# Patient Record
Sex: Male | Born: 1945
Health system: Southern US, Community
[De-identification: ages and names within clinical notes are randomized; demographics above are authoritative.]

## PROBLEM LIST (undated history)

## (undated) DIAGNOSIS — T8859XA Other complications of anesthesia, initial encounter: Secondary | ICD-10-CM

## (undated) DIAGNOSIS — R011 Cardiac murmur, unspecified: Secondary | ICD-10-CM

## (undated) DIAGNOSIS — C61 Malignant neoplasm of prostate: Secondary | ICD-10-CM

## (undated) DIAGNOSIS — Z9889 Other specified postprocedural states: Secondary | ICD-10-CM

## (undated) DIAGNOSIS — G4733 Obstructive sleep apnea (adult) (pediatric): Secondary | ICD-10-CM

## (undated) DIAGNOSIS — H9190 Unspecified hearing loss, unspecified ear: Secondary | ICD-10-CM

## (undated) DIAGNOSIS — Z9989 Dependence on other enabling machines and devices: Secondary | ICD-10-CM

## (undated) DIAGNOSIS — E119 Type 2 diabetes mellitus without complications: Secondary | ICD-10-CM

## (undated) DIAGNOSIS — Q761 Klippel-Feil syndrome: Secondary | ICD-10-CM

## (undated) DIAGNOSIS — M47812 Spondylosis without myelopathy or radiculopathy, cervical region: Secondary | ICD-10-CM

## (undated) DIAGNOSIS — Z973 Presence of spectacles and contact lenses: Secondary | ICD-10-CM

## (undated) DIAGNOSIS — Z8546 Personal history of malignant neoplasm of prostate: Secondary | ICD-10-CM

## (undated) DIAGNOSIS — D649 Anemia, unspecified: Secondary | ICD-10-CM

## (undated) DIAGNOSIS — R112 Nausea with vomiting, unspecified: Secondary | ICD-10-CM

## (undated) DIAGNOSIS — I10 Essential (primary) hypertension: Secondary | ICD-10-CM

## (undated) DIAGNOSIS — M5030 Other cervical disc degeneration, unspecified cervical region: Secondary | ICD-10-CM

## (undated) DIAGNOSIS — N281 Cyst of kidney, acquired: Secondary | ICD-10-CM

## (undated) DIAGNOSIS — I6529 Occlusion and stenosis of unspecified carotid artery: Secondary | ICD-10-CM

## (undated) DIAGNOSIS — Z8739 Personal history of other diseases of the musculoskeletal system and connective tissue: Secondary | ICD-10-CM

## (undated) DIAGNOSIS — E785 Hyperlipidemia, unspecified: Secondary | ICD-10-CM

## (undated) DIAGNOSIS — M5136 Other intervertebral disc degeneration, lumbar region: Secondary | ICD-10-CM

## (undated) DIAGNOSIS — C679 Malignant neoplasm of bladder, unspecified: Secondary | ICD-10-CM

## (undated) DIAGNOSIS — I2510 Atherosclerotic heart disease of native coronary artery without angina pectoris: Secondary | ICD-10-CM

## (undated) DIAGNOSIS — Z860101 Personal history of adenomatous and serrated colon polyps: Secondary | ICD-10-CM

## (undated) DIAGNOSIS — N400 Enlarged prostate without lower urinary tract symptoms: Secondary | ICD-10-CM

## (undated) DIAGNOSIS — M1990 Unspecified osteoarthritis, unspecified site: Secondary | ICD-10-CM

## (undated) DIAGNOSIS — Z86010 Personal history of colonic polyps: Secondary | ICD-10-CM

## (undated) DIAGNOSIS — I739 Peripheral vascular disease, unspecified: Secondary | ICD-10-CM

## (undated) DIAGNOSIS — M51369 Other intervertebral disc degeneration, lumbar region without mention of lumbar back pain or lower extremity pain: Secondary | ICD-10-CM

## (undated) DIAGNOSIS — K5730 Diverticulosis of large intestine without perforation or abscess without bleeding: Secondary | ICD-10-CM

## (undated) HISTORY — DX: Atherosclerotic heart disease of native coronary artery without angina pectoris: I25.10

## (undated) HISTORY — PX: INTRAOPERATIVE ARTERIOGRAM: SHX5157

## (undated) HISTORY — PX: COLONOSCOPY: SHX174

## (undated) HISTORY — DX: Essential (primary) hypertension: I10

## (undated) HISTORY — DX: Obstructive sleep apnea (adult) (pediatric): G47.33

## (undated) HISTORY — DX: Hyperlipidemia, unspecified: E78.5

## (undated) HISTORY — PX: APPENDECTOMY: SHX54

## (undated) HISTORY — DX: Dependence on other enabling machines and devices: Z99.89

---

## 1996-09-17 HISTORY — PX: SEPTOPLASTY: SUR1290

## 2002-03-17 ENCOUNTER — Ambulatory Visit: Admission: RE | Admit: 2002-03-17 | Discharge: 2002-03-17 | Payer: Self-pay | Admitting: Family Medicine

## 2002-06-29 ENCOUNTER — Encounter: Payer: Self-pay | Admitting: Internal Medicine

## 2002-06-29 ENCOUNTER — Ambulatory Visit: Admission: RE | Admit: 2002-06-29 | Discharge: 2002-06-29 | Payer: Self-pay | Admitting: Critical Care Medicine

## 2002-06-29 ENCOUNTER — Encounter: Payer: Self-pay | Admitting: Pulmonary Disease

## 2002-07-14 ENCOUNTER — Encounter: Payer: Self-pay | Admitting: Pulmonary Disease

## 2003-04-13 ENCOUNTER — Ambulatory Visit: Admission: RE | Admit: 2003-04-13 | Discharge: 2003-07-12 | Payer: Self-pay | Admitting: Radiation Oncology

## 2003-08-03 ENCOUNTER — Ambulatory Visit: Admission: RE | Admit: 2003-08-03 | Discharge: 2003-09-23 | Payer: Self-pay | Admitting: Radiation Oncology

## 2003-08-05 ENCOUNTER — Inpatient Hospital Stay (HOSPITAL_COMMUNITY): Admission: EM | Admit: 2003-08-05 | Discharge: 2003-08-09 | Payer: Self-pay | Admitting: Emergency Medicine

## 2003-08-19 ENCOUNTER — Encounter (INDEPENDENT_AMBULATORY_CARE_PROVIDER_SITE_OTHER): Payer: Self-pay

## 2003-08-19 ENCOUNTER — Ambulatory Visit (HOSPITAL_COMMUNITY): Admission: RE | Admit: 2003-08-19 | Discharge: 2003-08-19 | Payer: Self-pay | Admitting: Urology

## 2003-08-19 ENCOUNTER — Ambulatory Visit (HOSPITAL_BASED_OUTPATIENT_CLINIC_OR_DEPARTMENT_OTHER): Admission: RE | Admit: 2003-08-19 | Discharge: 2003-08-19 | Payer: Self-pay | Admitting: Urology

## 2003-08-19 HISTORY — PX: OTHER SURGICAL HISTORY: SHX169

## 2003-11-02 ENCOUNTER — Ambulatory Visit (HOSPITAL_COMMUNITY): Admission: RE | Admit: 2003-11-02 | Discharge: 2003-11-02 | Payer: Self-pay | Admitting: Gastroenterology

## 2004-11-05 ENCOUNTER — Emergency Department (HOSPITAL_COMMUNITY): Admission: EM | Admit: 2004-11-05 | Discharge: 2004-11-05 | Payer: Self-pay | Admitting: Emergency Medicine

## 2004-11-16 ENCOUNTER — Ambulatory Visit: Payer: Self-pay | Admitting: Pulmonary Disease

## 2005-05-11 ENCOUNTER — Inpatient Hospital Stay (HOSPITAL_COMMUNITY): Admission: RE | Admit: 2005-05-11 | Discharge: 2005-05-15 | Payer: Self-pay | Admitting: Neurosurgery

## 2005-05-11 HISTORY — PX: CERVICAL FUSION: SHX112

## 2005-05-22 ENCOUNTER — Encounter (HOSPITAL_COMMUNITY): Admission: RE | Admit: 2005-05-22 | Discharge: 2005-06-16 | Payer: Self-pay | Admitting: Neurosurgery

## 2005-06-05 ENCOUNTER — Inpatient Hospital Stay (HOSPITAL_COMMUNITY): Admission: RE | Admit: 2005-06-05 | Discharge: 2005-06-13 | Payer: Self-pay | Admitting: Neurosurgery

## 2005-06-05 ENCOUNTER — Ambulatory Visit: Payer: Self-pay | Admitting: Physical Medicine & Rehabilitation

## 2005-06-05 HISTORY — PX: OTHER SURGICAL HISTORY: SHX169

## 2005-08-07 ENCOUNTER — Encounter (HOSPITAL_COMMUNITY): Admission: RE | Admit: 2005-08-07 | Discharge: 2005-09-07 | Payer: Self-pay | Admitting: Neurosurgery

## 2006-07-16 ENCOUNTER — Ambulatory Visit: Payer: Self-pay | Admitting: Pulmonary Disease

## 2007-04-08 ENCOUNTER — Ambulatory Visit (HOSPITAL_COMMUNITY): Admission: RE | Admit: 2007-04-08 | Discharge: 2007-04-08 | Payer: Self-pay | Admitting: Internal Medicine

## 2007-11-12 ENCOUNTER — Ambulatory Visit: Payer: Self-pay | Admitting: Internal Medicine

## 2007-11-12 LAB — CONVERTED CEMR LAB
Alkaline Phosphatase: 83 units/L (ref 39–117)
CO2: 30 meq/L (ref 19–32)
Calcium: 9.5 mg/dL (ref 8.4–10.5)
Chloride: 100 meq/L (ref 96–112)
Creatinine, Ser: 1.2 mg/dL (ref 0.4–1.5)
Eosinophils Absolute: 0.3 10*3/uL (ref 0.0–0.6)
Eosinophils Relative: 4.6 % (ref 0.0–5.0)
Glucose, Bld: 447 mg/dL — ABNORMAL HIGH (ref 70–99)
Lymphocytes Relative: 22.1 % (ref 12.0–46.0)
MCV: 90.4 fL (ref 78.0–100.0)
Neutro Abs: 4.6 10*3/uL (ref 1.4–7.7)
Platelets: 188 10*3/uL (ref 150–400)
Total Protein: 6.8 g/dL (ref 6.0–8.3)
WBC: 7.5 10*3/uL (ref 4.5–10.5)

## 2007-11-13 ENCOUNTER — Ambulatory Visit: Payer: Self-pay | Admitting: Cardiology

## 2007-12-24 ENCOUNTER — Ambulatory Visit: Payer: Self-pay | Admitting: Internal Medicine

## 2008-01-12 ENCOUNTER — Encounter: Payer: Self-pay | Admitting: Orthopedic Surgery

## 2008-01-12 ENCOUNTER — Encounter: Admission: RE | Admit: 2008-01-12 | Discharge: 2008-01-12 | Payer: Self-pay | Admitting: Neurosurgery

## 2008-02-07 ENCOUNTER — Inpatient Hospital Stay (HOSPITAL_COMMUNITY): Admission: EM | Admit: 2008-02-07 | Discharge: 2008-02-10 | Payer: Self-pay | Admitting: Emergency Medicine

## 2008-03-22 ENCOUNTER — Telehealth (INDEPENDENT_AMBULATORY_CARE_PROVIDER_SITE_OTHER): Payer: Self-pay | Admitting: *Deleted

## 2008-03-22 DIAGNOSIS — Z8546 Personal history of malignant neoplasm of prostate: Secondary | ICD-10-CM | POA: Insufficient documentation

## 2008-03-22 DIAGNOSIS — M1990 Unspecified osteoarthritis, unspecified site: Secondary | ICD-10-CM | POA: Insufficient documentation

## 2008-03-22 DIAGNOSIS — I739 Peripheral vascular disease, unspecified: Secondary | ICD-10-CM | POA: Insufficient documentation

## 2008-03-22 DIAGNOSIS — K5909 Other constipation: Secondary | ICD-10-CM | POA: Insufficient documentation

## 2008-03-22 DIAGNOSIS — G4733 Obstructive sleep apnea (adult) (pediatric): Secondary | ICD-10-CM | POA: Insufficient documentation

## 2008-03-22 DIAGNOSIS — E785 Hyperlipidemia, unspecified: Secondary | ICD-10-CM | POA: Insufficient documentation

## 2008-03-22 DIAGNOSIS — C679 Malignant neoplasm of bladder, unspecified: Secondary | ICD-10-CM | POA: Insufficient documentation

## 2008-03-22 DIAGNOSIS — Z8719 Personal history of other diseases of the digestive system: Secondary | ICD-10-CM | POA: Insufficient documentation

## 2008-03-23 ENCOUNTER — Ambulatory Visit: Payer: Self-pay | Admitting: Pulmonary Disease

## 2009-01-04 ENCOUNTER — Encounter: Admission: RE | Admit: 2009-01-04 | Discharge: 2009-01-04 | Payer: Self-pay | Admitting: Cardiovascular Disease

## 2009-01-05 ENCOUNTER — Ambulatory Visit (HOSPITAL_COMMUNITY): Admission: RE | Admit: 2009-01-05 | Discharge: 2009-01-05 | Payer: Self-pay | Admitting: Cardiovascular Disease

## 2009-02-03 ENCOUNTER — Ambulatory Visit: Payer: Self-pay | Admitting: *Deleted

## 2009-08-09 ENCOUNTER — Ambulatory Visit: Payer: Self-pay | Admitting: Vascular Surgery

## 2009-08-31 ENCOUNTER — Ambulatory Visit (HOSPITAL_COMMUNITY): Admission: RE | Admit: 2009-08-31 | Discharge: 2009-08-31 | Payer: Self-pay | Admitting: Internal Medicine

## 2009-09-17 HISTORY — PX: OTHER SURGICAL HISTORY: SHX169

## 2009-11-08 DIAGNOSIS — I10 Essential (primary) hypertension: Secondary | ICD-10-CM | POA: Insufficient documentation

## 2009-11-08 DIAGNOSIS — I739 Peripheral vascular disease, unspecified: Secondary | ICD-10-CM

## 2009-11-08 DIAGNOSIS — C61 Malignant neoplasm of prostate: Secondary | ICD-10-CM | POA: Insufficient documentation

## 2009-11-08 DIAGNOSIS — M1A00X0 Idiopathic chronic gout, unspecified site, without tophus (tophi): Secondary | ICD-10-CM | POA: Insufficient documentation

## 2009-11-08 DIAGNOSIS — I779 Disorder of arteries and arterioles, unspecified: Secondary | ICD-10-CM | POA: Insufficient documentation

## 2009-11-08 DIAGNOSIS — E663 Overweight: Secondary | ICD-10-CM | POA: Insufficient documentation

## 2009-11-23 ENCOUNTER — Ambulatory Visit: Payer: Self-pay | Admitting: Cardiovascular Disease

## 2009-12-11 ENCOUNTER — Encounter: Payer: Self-pay | Admitting: Orthopedic Surgery

## 2009-12-12 ENCOUNTER — Ambulatory Visit: Payer: Self-pay | Admitting: Orthopedic Surgery

## 2009-12-12 DIAGNOSIS — IMO0002 Reserved for concepts with insufficient information to code with codable children: Secondary | ICD-10-CM

## 2009-12-12 DIAGNOSIS — M1710 Unilateral primary osteoarthritis, unspecified knee: Secondary | ICD-10-CM | POA: Insufficient documentation

## 2009-12-12 DIAGNOSIS — M25569 Pain in unspecified knee: Secondary | ICD-10-CM | POA: Insufficient documentation

## 2010-01-11 ENCOUNTER — Telehealth: Payer: Self-pay | Admitting: Orthopedic Surgery

## 2010-01-11 ENCOUNTER — Ambulatory Visit: Payer: Self-pay | Admitting: Orthopedic Surgery

## 2010-01-11 DIAGNOSIS — M25469 Effusion, unspecified knee: Secondary | ICD-10-CM | POA: Insufficient documentation

## 2010-02-06 ENCOUNTER — Encounter: Admission: RE | Admit: 2010-02-06 | Discharge: 2010-02-06 | Payer: Self-pay | Admitting: Neurosurgery

## 2010-02-06 ENCOUNTER — Encounter: Payer: Self-pay | Admitting: Orthopedic Surgery

## 2010-02-27 ENCOUNTER — Encounter: Admission: RE | Admit: 2010-02-27 | Discharge: 2010-02-27 | Payer: Self-pay | Admitting: Neurosurgery

## 2010-03-23 ENCOUNTER — Encounter: Admission: RE | Admit: 2010-03-23 | Discharge: 2010-03-23 | Payer: Self-pay | Admitting: Neurosurgery

## 2010-04-14 ENCOUNTER — Emergency Department (HOSPITAL_COMMUNITY): Admission: EM | Admit: 2010-04-14 | Discharge: 2010-04-14 | Payer: Self-pay | Admitting: Emergency Medicine

## 2010-05-09 ENCOUNTER — Encounter: Admission: RE | Admit: 2010-05-09 | Discharge: 2010-05-09 | Payer: Self-pay | Admitting: Neurosurgery

## 2010-05-09 ENCOUNTER — Encounter: Payer: Self-pay | Admitting: Cardiovascular Disease

## 2010-05-30 ENCOUNTER — Ambulatory Visit: Payer: Self-pay | Admitting: Cardiovascular Disease

## 2010-05-30 ENCOUNTER — Ambulatory Visit: Payer: Self-pay

## 2010-06-14 ENCOUNTER — Telehealth: Payer: Self-pay | Admitting: Cardiovascular Disease

## 2010-07-19 ENCOUNTER — Ambulatory Visit: Payer: Self-pay | Admitting: Orthopedic Surgery

## 2010-07-24 LAB — CONVERTED CEMR LAB: WBC, Synovial: 11475 — ABNORMAL HIGH (ref 0–200)

## 2010-08-06 ENCOUNTER — Emergency Department (HOSPITAL_COMMUNITY)
Admission: EM | Admit: 2010-08-06 | Discharge: 2010-08-06 | Disposition: A | Discharge status: Other Acute Inpatient Hospital | Payer: Self-pay | Source: Home / Self Care | Admitting: Emergency Medicine

## 2010-10-08 ENCOUNTER — Encounter: Payer: Self-pay | Admitting: Neurosurgery

## 2010-10-11 ENCOUNTER — Encounter: Payer: Self-pay | Admitting: Cardiovascular Disease

## 2010-10-17 NOTE — Assessment & Plan Note (Signed)
Summary: Hall/knee pain/NEEDS XRAY/[facility]/frs   Vital Signs:  Patient profile:   65 year old male Height:      68 inches Weight:      232 pounds Pulse rate:   70 / minute Resp:     16 per minute  Vitals Entered By: Fuller Canada MD (December 12, 2009 2:22 PM)  Visit Type:  new patient Referring Provider:  self Primary Provider:  Dwana Melena [facility])  CC:  right knee pain.  History of Present Illness: 65 year old male with carotid stenosis presents at the request of Dr. Shelva Majestic with RIGHT leg pain he says but says it is really his RIGHT knee and it radiates down to his RIGHT foot.  He says when he bends his knee really hurts.  He says he has some swelling.  He says Vicodin doesn't help.  He says it's a dull aching moderate pain which is constant came on gradually associated with the swelling.  He also has some lumbar disc disease he had an MRI done April 2009 it showed degenerative disc disease in the lumbar spine with multilevel disease from L3-S1 primarily was facet arthritis and degeneration of the disc material   Xrays today in our office.  FYI MRI L spine 01/12/2008 for review, [place].  Meds: Clonidine, Allopurinol, Niaspan, Klor Con, Labetalol, Crestor, Exforge, Lasix, Lovaza, Plavix, Januvia, Glimeperide, Neurontin, ASA, Tylenol, Vicodin 5, Levemir.       Allergies: 1)  ! * Lyrica  Past History:  Past Surgical History: Status post cervical disk fusion.  appendix  Family History: FH of Cancer:  Family History of Diabetes Family History of Arthritis  Social History: Patient is married.  disabled no smoking no alcohol 2 cups of coffee per day  Review of Systems Constitutional:  Denies weight loss, weight gain, fever, chills, and fatigue. Cardiovascular:  Denies chest pain, palpitations, fainting, and murmurs. Respiratory:  Denies short of breath, wheezing, couch, tightness, pain on inspiration, and snoring . Gastrointestinal:  Denies  heartburn, nausea, vomiting, diarrhea, constipation, and blood in your stools. Genitourinary:  Denies frequency, urgency, difficulty urinating, painful urination, flank pain, and bleeding in urine. Neurologic:  Denies numbness, tingling, unsteady gait, dizziness, tremors, and seizure. Musculoskeletal:  Complains of joint pain, swelling, and stiffness; denies instability, redness, heat, and muscle pain. Endocrine:  Denies excessive thirst, exessive urination, and heat or cold intolerance. Psychiatric:  Denies nervousness, depression, anxiety, and hallucinations. Skin:  Denies changes in the skin, poor healing, rash, itching, and redness. HEENT:  Denies blurred or double vision, eye pain, redness, and watering. Immunology:  Denies seasonal allergies, sinus problems, and allergic to bee stings. Hemoatologic:  Complains of easy bleeding and brusing.  Physical Exam  Additional Exam:  examination vitals are stable as recorded.  Patient is well-developed well-nourished in no acute distress.  Patient is oriented times person place.  He has no generalized joint laxity.  The overall appearance of the RIGHT and LEFT knees are normal.  Gait testing normal.  Leg lengths equal.  He's tender in the medial joint line of the RIGHT knee he has varus deformity of the RIGHT knee.  Has what appears to be small joint effusion.  He has no anterior laxity no medial or lateral laxity.  His active range of motion 130 extension full in both knees.  Strength testing normal both knees.  Sensation in the RIGHT leg normal.  Vascular status lower extremities fully intact.     Impression & Recommendations: Impression osteoarthritis with  varus  deformity RIGHT knee  Code 715.16  / 719.56.  Office visit [phone]  new patient level III modifier 25 for aspiration injection  RIGHT knee and attempted aspiration unsuccessful for him to get any fluid.  We did inject Depo-Medrol and lidocaine. Verbal consent was obtained. The knee  was prepped with alcohol and ethyl chloride. 1 cc of depomedrol 40mg /cc and 4 cc of lidocaine 1% was injected. there were no complications.   Radiographic interpretation 3 views of the RIGHT knee were obtained and reviewed.  please see mild varus alignment and mild symmetric joint space narrowing with bowing of the proximal tibia consistent with mild varus osteoarthritis. I will continue his Vicodin he can't take anti-inflammatories and his pain medicine could be due to increased at your discretion but I would tend not to do that.  It doesn't appear at this point that he needs surgery as a knee replacement would be a last resort  Follow up as needed  Other Orders: New Patient Level III (81191) Joint Aspirate / Injection, Large (20610) Depo- Medrol 40mg  (J1030) Knee x-ray,  3 views (47829)  Patient Instructions: 1)  Please schedule a follow-up appointment as needed. 2)  You have received an injection of cortisone today. You may experience increased pain at the injection site. Apply ice pack to the area for 20 minutes every 2 hours and take 2 xtra strength tylenol every 8 hours. This increased pain will usually resolve in 24 hours. The injection will take effect in 3-10 days.

## 2010-10-17 NOTE — Progress Notes (Signed)
Summary: PHI  PHI   Imported By: Harlon Flor 11/24/2009 10:09:05  _____________________________________________________________________  External Attachment:    Type:   Image     Comment:   External Document

## 2010-10-17 NOTE — Assessment & Plan Note (Signed)
Summary: F6M/AMD   Visit Type:  Follow-up Referring Tranae Laramie:  self Primary Quentyn Kolbeck:  Dwana Melena [facility])  CC:  c/o decrease blood pressure with black out spells.  Wife had to catch him this a.m. from falling to the floor.Marland Kitchen  History of Present Illness: 65 year old male with a history of obstructive sleep apnea, peripheral vascular disease with severe right internal carotid arterial stenosis estimated at 70 to 80%, diabetes, hypertension, hyperlipidemia, obesity, history of cervical spine surgery and prostate cancer with treatment who presents for routine followup.  [name] states that he stopped smoking approximately 3 years ago. He did smoke for a short period around December.  He has had significant problems with hypotension recently. Dr. Renae Fickle has recently decreased his ex for chest pain continues to have very low pressures and passing out spells. He has a list of his blood pressures that showed sometimes the systolic pressure is less than 100. He is off clonidine, takes labetalol, exforge 5/320 mg. he denies any chest pain. He has had no significant lower extremity edema or shortness of breath.  EKG shows normal sinus rhythm with rate of 84 beats per minute, no significant ST or T wave changes.  Current Medications (verified): 1)  Furosemide 40 Mg  Tabs (Furosemide) .... Take 2 Tablet By Mouth Once A Day 2)  Plavix 75 Mg  Tabs (Clopidogrel Bisulfate) .... Take 1 Tablet By Mouth Once A Day 3)  Colon Cleanser   Caps (Misc Natural Products) .... Take 1 Tablet By Mouth Once A Day 4)  Klor-Con 20 Meq  Pack (Potassium Chloride) .... Take 1 Tablet By Mouth Once A Day 5)  Labetalol Hcl 300 Mg  Tabs (Labetalol Hcl) .... Take 1 Tablet By Mouth Two Times A Day 6)  Exforge 5-320 Mg Tabs (Amlodipine Besylate-Valsartan) .... One Tablet Once Daily 7)  Crestor 5 Mg  Tabs (Rosuvastatin Calcium) .... Take 1 Tablet By Mouth Once A Day 8)  Lovaza 1 Gm  Caps (Omega-3-Acid Ethyl Esters) .... Take 2  Tabs By Mouth Two Times A Day 9)  Allopurinol 300 Mg  Tabs (Allopurinol) .... Take 1 Tablet By Mouth Once A Day 10)  Niaspan 1000 Mg  Tbcr (Niacin (Antihyperlipidemic)) .... Take 1 Tablet By Mouth Once A Day 11)  Aspirin Low Dose 81 Mg  Tabs (Aspirin) .... Take 1 Tablet By Mouth Once A Day 12)  Gabapentin 300 Mg  Caps (Gabapentin) .... Take 1 Tablet By Mouth Three Times A Day 13)  Glimepiride 2 Mg  Tabs (Glimepiride) .Marland Kitchen.. 1 By Mouth Two Times A Day 14)  Tylenol Extra Strength 500 Mg  Tabs (Acetaminophen) .... Take By Mouth As Needed 15)  Tylenol Pm Extra Strength 500-25 Mg  Tabs (Diphenhydramine-Apap (Sleep)) .... Use As Needed 16)  Colchicine 0.6 Mg  Tabs (Colchicine) .... Take By Mouth As Needed 17)  Hydrocodone/ Apap .... Take 1 Tab By Mouth Every 4 To 6 Hours As Needed 18)  Januvia 100 Mg  Tabs (Sitagliptin Phosphate) .Marland Kitchen.. 1 By Mouth Daily 19)  Levemir 100 Unit/ml Soln (Insulin Detemir) .... As Directed  Allergies (verified): 1)  ! * Lyrica  Past History:  Past Medical History: Last updated: 11/08/2009 Current Problems:  HYPERLIPIDEMIA (ICD-272.4) SLEEP APNEA (ICD-780.57) Hx of BLADDER CANCER (ICD-188.9) PROSTATE CANCER, HX OF (ICD-V10.46) PERIPHERAL VASCULAR DISEASE (ICD-443.9) DIVERTICULITIS, HX OF (ICD-V12.79) DEGENERATIVE JOINT DISEASE (ICD-715.90) CONSTIPATION, CHRONIC (ICD-564.09) Diabetes mellitus.  Hypertension.  Gouty arthritis.  History of cervical disk prolapse.  History of carcinoma of the prostate.  Asymptomatic severe right internal carotid artery stenosis OSA- BiPAP obesity  Past Surgical History: Last updated: 12/12/2009 Status post cervical disk fusion.  appendix  Family History: Last updated: 12/12/2009 FH of Cancer:  Family History of Diabetes Family History of Arthritis  Social History: Last updated: 12/12/2009 Patient is married.  disabled no smoking no alcohol 2 cups of coffee per day  Risk Factors: Alcohol Use: <1  (11/23/2009) Caffeine Use: 2 cups (11/23/2009) Exercise: very little (11/23/2009)  Risk Factors: Smoking Status: current (11/23/2009) Packs/Day: 1.0 (11/23/2009)  Vital Signs:  Patient profile:   65 year old male Height:      68 inches Weight:      225 pounds BMI:     34.33 Pulse rate:   83 / minute BP sitting:   92 / 48  (left arm) Cuff size:   large  Vitals Entered By: Bishop Dublin, CMA (May 30, 2010 10:48 AM)  Physical Exam  General:  Well developed, well nourished, in no acute distress. Head:  normocephalic and atraumatic Neck:  Neck supple, no JVD. No masses, thyromegaly or abnormal cervical nodes. Lungs:  mildly decreased breath sounds bilaterally Heart:  Non-displaced PMI, chest non-tender; regular rate and rhythm, S1, S2 with II/VI SEM, no rubs or gallops. Carotid upstroke normal, no bruit. Pedals normal pulses. No edema, no varicosities. Abdomen:  abdomen soft and non-tender without masses Msk:  Back normal, normal gait. Muscle strength and tone normal. Pulses:  pulses normal in all 4 extremities Extremities:  No clubbing or cyanosis. Neurologic:  Alert and oriented x 3. Skin:  Intact without lesions or rashes. Psych:  Normal affect.   Impression & Recommendations:  Problem # 1:  CAROTID ARTERY STENOSIS, RIGHT (ICD-433.10) severe disease in the right carotid artery estimated at 60-79%, peak systolic velocity 285, and diastolic velocity 83. Mild disease on the left.  We'll continue aggressive lipid management with goal LDL less than 70.  His updated medication list for this problem includes:    Plavix 75 Mg Tabs (Clopidogrel bisulfate) .Marland Kitchen... Take 1 tablet by mouth once a day    Aspirin Low Dose 81 Mg Tabs (Aspirin) .Marland Kitchen... Take 1 tablet by mouth once a day  Problem # 2:  HYPERTENSION, BENIGN (ICD-401.1) His blood pressure has been low causing near syncope. We have suggested to him that he cut his exforge in half to 2.5/160 mg daily. if he continues to  have low blood pressures, I've asked him to contact me.  The following medications were removed from the medication list:    Clonidine Hcl 0.2 Mg Tabs (Clonidine hcl) .Marland Kitchen... 1 tab by mouth two times a day His updated medication list for this problem includes:    Furosemide 40 Mg Tabs (Furosemide) .Marland Kitchen... Take 1 tablet by mouth once a day and 1 tablet by mouth as needed for shortness of breath or swelling    Labetalol Hcl 300 Mg Tabs (Labetalol hcl) .Marland Kitchen... Take 1 tablet by mouth two times a day    Exforge 5-320 Mg Tabs (Amlodipine besylate-valsartan) .Marland Kitchen... Take 1/2  tablet by mouth once a day    Aspirin Low Dose 81 Mg Tabs (Aspirin) .Marland Kitchen... Take 1 tablet by mouth once a day  Problem # 3:  HYPERLIPIDEMIA (ICD-272.4) Continue on Crestor. Goal LDL is less than 70.  His updated medication list for this problem includes:    Crestor 5 Mg Tabs (Rosuvastatin calcium) .Marland Kitchen... Take 1 tablet by mouth once a day    Lovaza 1 Gm Caps (Omega-3-acid ethyl  esters) .Marland Kitchen... Take 2 tabs by mouth two times a day    Niaspan 1000 Mg Tbcr (Niacin (antihyperlipidemic)) .Marland Kitchen... Take 1 tablet by mouth once a day  Other Orders: EKG w/ Interpretation (93000)  Patient Instructions: 1)  Your physician has recommended you make the following change in your medication: DECREASE furosemide 1 tab in AM and and extra dose in the afternoon as needed for shortness of breath or swelling 2)  Your physician wants you to follow-up in:   6 months You will receive a reminder letter in the mail two months in advance. If you don't receive a letter, please call our office to schedule the follow-up appointment. 3)  Your physician has requested that you regularly monitor and record your blood pressure readings at home.  Please use the same machine at the same time of day to check your readings and record them to bring to your follow-up visit. Prescriptions: PLAVIX 75 MG  TABS (CLOPIDOGREL BISULFATE) Take 1 tablet by mouth once a day  #12 x 0   Entered  by:   Benedict Needy, RN   Authorized by:   Dossie Arbour MD   Signed by:   Dossie Arbour MD on 05/30/2010   Method used:   Samples Given   RxID:   [id number] EXFORGE 5-320 MG TABS (AMLODIPINE BESYLATE-VALSARTAN) Take 1/2  tablet by mouth once a day  #28 x 0   Entered by:   Benedict Needy, RN   Authorized by:   Dossie Arbour MD   Signed by:   Dossie Arbour MD on 05/30/2010   Method used:   Samples Given   RxID:   [id number]   Appended Document: F6M/AMD We also suggested to Mr. Rodwell that he could try to cut back on his Lasix to one tab daily. He may be hypotensive if he is getting mildly dehydrated. If he develops edema in his lower extremities, he could take the second Lasix.

## 2010-10-17 NOTE — Progress Notes (Signed)
Summary: patient having increase knee pain  Phone Note Call from Patient   Summary of Call: Patient saw primary care, Dr Margo Aye, this morning, and states was advised to come directly back to see Dr Romeo Apple re: increased knee pain (? fluid) in same knee that he had injection 1 month ago.   Given appointment for this morning. Initial call taken by: Cammie Sickle,  January 11, 2010 11:20 AM

## 2010-10-17 NOTE — Assessment & Plan Note (Signed)
Summary: NEEDS FLUID DRAWN RT KNEE MAY NEED NEW XR/BSF   Visit Type:  Follow-up Referring Provider:  self Primary Provider:  Dwana Melena [facility])  CC:  right knee pain.  History of Present Illness: I saw [name] in the office today for a followup visit.  He is a 65 years old man with the complaint of:  right knee pain.  01/11/10 last aspiration and injection right knee.  Came home from [place] Friday 07/17/10, brain surgery 07/13/10.  Pain since surgery last week.  Had doppler of the right leg and it was negative.  Pain in the right hip, knee to the big toe, said big toe is locked up, has a hx of gout.  Big toe is red and painful, has Colchicine at home has not taken.  No numbness of the leg, no back pain, Dr. Jeral Fruit back dr.  Radford Pax for pain from surgery, Keflex , senna tablet, Glycol, Acetazolamide, LASIX, PLAVIX, kLOR CON, LABETALOL, EXFORGE, CRESTOR, LOVAZA, ALLOPURINOL, Asa, NIASPAN, GABAPENTIN 300MG  three times a day, TYLENOL, COLCHICINE has at home has not taken in a while,JANUVIA, LEVEMIR.   Allergies: 1)  ! * Lyrica  Physical Exam  Additional Exam:  this is a normally developed male grooming and managing her normal.  He has a warm to touch, swollen RIGHT knee, with painful range of motion throughout the arc. He has negative McMurray sign ligaments are stable.  There is a large joint effusion.  His great toe is also tender at the joint. It is warm to touch and painful to move.     Impression & Recommendations:  Problem # 1:  JOINT EFFUSION, KNEE (ICD-719.06) Assessment Deteriorated  This is most likely gout, probably from the fluid shifts, drinks or after surgery. Also, has acute radiculopathy, and history of lumbar disc disease at multiple levels. MRI report from May of 2011 shows+ significant disc disease, degeneration with disc protrusions. He is followed in [place] for that and we encouraged him to continue that.  Recommend ice,  rest, restart colchicine.  Fluid sent for fluid analysis, and crystal analysis.  Aspiration of 40 cc of cloudy fluid, inject cortisone as follows RIGHT knee.  Verbal consent was obtained. The RIGHT knee was prepped with alcohol and ethyl chloride. 1 cc of depomedrol 40mg /cc and 4 cc of lidocaine 1% was injected. there were no complications.  Orders: T- * Misc. Laboratory test [phone]) Est. Patient Level III (60454) Joint Aspirate / Injection, Large (20610) Depo- Medrol 40mg  (J1030)  Patient Instructions: 1)  Please go to Dr. Jeral Fruit for the back, hip and leg pain. 2)  Restart Colchicine for gout. 3)  You have received an injection of cortisone today. You may experience increased pain at the injection site. Apply ice pack to the area for 20 minutes every 2 hours and take 2 xtra strength tylenol every 8 hours. This increased pain will usually resolve in 24 hours. The injection will take effect in 3-10 days.  4)  Dr will call results of fluid to you 5)  apply ice three times a day for 30 min    Orders Added: 1)  T- * Misc. Laboratory test [99999] 2)  Est. Patient Level III [09811] 3)  Joint Aspirate / Injection, Large [20610] 4)  Depo- Medrol 40mg  [J1030]

## 2010-10-17 NOTE — Letter (Signed)
Summary: History form  History form   Imported By: Jacklynn Ganong 12/16/2009 08:06:50  _____________________________________________________________________  External Attachment:    Type:   Image     Comment:   External Document

## 2010-10-17 NOTE — Letter (Signed)
Summary: *Orthopedic Consult Note  [facility]  [address] [place] [address]  [place], [place] [address]   Phone: [phone]  Fax: [phone]    Re:    [name] DOB:    October 29, 1945   Dear: Timothy Lasso   Thank you for requesting that we see the above patient for consultation.  A copy of the detailed office note will be sent under separate cover, for your review.  Evaluation today is consistent with: arthritis   Our recommendation is for: injection       Thank you for this opportunity to look after your patient.  Sincerely,   Terrance Mass. MD.

## 2010-10-17 NOTE — Assessment & Plan Note (Signed)
Summary: INCREASED KNEE PAIN FOL'G INJEC/   Visit Type:  Follow-up Referring Provider:  self Primary Provider:  Dwana Melena [facility])  CC:  right knee pain.  History of Present Illness: severe lumbar disc disease followed by [facility].  Has degenerative disc disease with disc protrusions and lateral recess stenosis between L3 and S1.  I was unable to aspirate his RIGHT knee last time we assumed he had some arthritis his x-ray showed a mild amount of joint space narrowing.  He presented back to his primary care physician who sent him over here today for reevaluation.  He continues to have pain and swelling in the RIGHT leg.  He has pain and swelling in the RIGHT knee.  Pain behind the knee and on the lateral side of the knee.  He also has pain in the back of the thigh it radiates up into the hip  meds Vicodin and Aleve  Relief minimal  Allergies: 1)  ! * Lyrica  Past History:  Past Medical History: Last updated: 11/08/2009 Current Problems:  HYPERLIPIDEMIA (ICD-272.4) SLEEP APNEA (ICD-780.57) Hx of BLADDER CANCER (ICD-188.9) PROSTATE CANCER, HX OF (ICD-V10.46) PERIPHERAL VASCULAR DISEASE (ICD-443.9) DIVERTICULITIS, HX OF (ICD-V12.79) DEGENERATIVE JOINT DISEASE (ICD-715.90) CONSTIPATION, CHRONIC (ICD-564.09) Diabetes mellitus.  Hypertension.  Gouty arthritis.  History of cervical disk prolapse.  History of carcinoma of the prostate.  Asymptomatic severe right internal carotid artery stenosis OSA- BiPAP obesity  Past Surgical History: Last updated: 12/12/2009 Status post cervical disk fusion.  appendix  Family History: Last updated: 12/12/2009 FH of Cancer:  Family History of Diabetes Family History of Arthritis  Social History: Last updated: 12/12/2009 Patient is married.  disabled no smoking no alcohol 2 cups of coffee per day  Risk Factors: Alcohol Use: <1 (11/23/2009) Caffeine Use: 2 cups (11/23/2009) Exercise: very little (11/23/2009)  Risk  Factors: Smoking Status: current (11/23/2009) Packs/Day: 1.0 (11/23/2009)  Review of Systems Constitutional:  Denies weight loss, weight gain, fever, chills, and fatigue. Neurologic:  Complains of numbness and tingling; chronic. Musculoskeletal:  See HPI; no mechanical symptoms.  Physical Exam  Additional Exam:  general appearance was normal, he is oriented x3, his mood and affect was normal,  His gait and station were abnormal he was limping favoring his RIGHT leg.  The RIGHT lower extremity exam revealed tenderness in the lateral compartment which was mild in a joint effusion which was moderate.  Had tenderness in the back of his thigh and a positive straight leg raise.  He lacks 5 of extension he flexes knee to 115.  His knee was stable.  His strength and stability were normal.  His skin was intact.  Good pulses.     Impression & Recommendations:  Problem # 1:  PAIN IN JOINT, LOWER LEG (ICD-719.46) Assessment Deteriorated  aspirated 40 cc of clear fluid and injected the knee as noted below.  Verbal consent was obtained. The knee was prepped with alcohol and ethyl chloride. 1 cc of depomedrol 40mg /cc and 4 cc of lidocaine 1% was injected. there were no complications. [right]  His updated medication list for this problem includes:    Aspirin Low Dose 81 Mg Tabs (Aspirin) .Marland Kitchen... Take 1 tablet by mouth once a day    Tylenol Extra Strength 500 Mg Tabs (Acetaminophen) .Marland Kitchen... Take by mouth as needed  Orders: Est. Patient Level III (60454) Depo- Medrol 40mg  (J1030) Joint Aspirate / Injection, Large (20610)  Problem # 2:  ARTHRITIS, RIGHT KNEE (UJW-119.14) Assessment: Deteriorated  Orders: Est. Patient Level III (78295)  Depo- Medrol 40mg  (J1030) Joint Aspirate / Injection, Large (20610)  Problem # 3:  JOINT EFFUSION, KNEE (ICD-719.06) Assessment: Deteriorated  Orders: Est. Patient Level III (54098) Depo- Medrol 40mg  (J1030) Joint Aspirate / Injection, Large  (20610)  Patient Instructions: 1)  You have received an injection of cortisone today. You may experience increased pain at the injection site. Apply ice pack to the area for 20 minutes every 2 hours and take 2 xtra strength tylenol every 8 hours. This increased pain will usually resolve in 24 hours. The injection will take effect in 3-10 days.  2)  Please schedule a follow-up appointment as needed.

## 2010-10-17 NOTE — Assessment & Plan Note (Signed)
Summary: NP6/AMD   Visit Type:  New Patient Referring Provider:  Mariah Milling Primary Provider:  Dwana Melena [facility])  CC:  started smoking again, no shortness of breath, some edema in ankles and feet, and no chest pains. .  History of Present Illness: 65 year old male with a history of obstructive sleep apnea, peripheral vascular disease with severe right internal carotid arterial stenosis estimated at 70 to 80%, diabetes, hypertension, hyperlipidemia, obesity, history of cervical spine surgery and prostate cancer with treatment who presents to reestablish care. He was last seen at [facility] by myself in December 2009.  [name] states that he started smoking one month ago. He picked up a cigarette and is now back to one pack per day. he is interested in restarting Chantix in an effort to quit. He denies any significant shortness of breath, chest pain, lightheadedness or dizziness. He does not exercise and she states that it gives him gout.   He states that he has been taking his blood pressure medications though he tries to get samples from Dr. Margo Aye as he has trouble with the co-pays.  he was seen Dr. Madilyn Fireman in [facility] for his carotid arterial disease though he states that he did not want to operate on him due to his cervical sine disease and then Dr. Madilyn Fireman move to [place]. He had an ultrasound in November 2010 showing 60-79% right internal carotid arterial disease.  Preventive Screening-Counseling & Management  Alcohol-Tobacco     Alcohol drinks/day: <1     Smoking Status: current     Packs/Day: 1.0  Caffeine-Diet-Exercise     Caffeine use/day: 2 cups     Does Patient Exercise: very little  Current Problems (verified): 1)  Overweight/obesity  (ICD-278.02) 2)  Carotid Artery Stenosis, Right  (ICD-433.10) 3)  Prostate Cancer  (ICD-185) 4)  Gouty Arthritis, Chronic  (ICD-274.02) 5)  Diabetes Mellitus  (ICD-250.00) 6)  Hypertension, Benign   (ICD-401.1) 7)  Hyperlipidemia  (ICD-272.4) 8)  Sleep Apnea  (ICD-780.57) 9)  Hx of Bladder Cancer  (ICD-188.9) 10)  Prostate Cancer, Hx of  (ICD-V10.46) 11)  Peripheral Vascular Disease  (ICD-443.9) 12)  Diverticulitis, Hx of  (ICD-V12.79) 13)  Degenerative Joint Disease  (ICD-715.90) 14)  Constipation, Chronic  (ICD-564.09)  Current Medications (verified): 1)  Furosemide 40 Mg  Tabs (Furosemide) .... Take 1 Tablet By Mouth Once A Day 2)  Plavix 75 Mg  Tabs (Clopidogrel Bisulfate) .... Take 1 Tablet By Mouth Once A Day 3)  Colon Cleanser   Caps (Misc Natural Products) .... Take 1 Tablet By Mouth Once A Day 4)  Klor-Con 20 Meq  Pack (Potassium Chloride) .... Take 1 Tablet By Mouth Once A Day 5)  Labetalol Hcl 300 Mg  Tabs (Labetalol Hcl) .... Take 1 Tablet By Mouth Two Times A Day 6)  Exforge 5-320 Mg Tabs (Amlodipine Besylate-Valsartan) .Marland Kitchen.. 1 By Mouth Once Daily 7)  Crestor 5 Mg  Tabs (Rosuvastatin Calcium) .... Take 1 Tablet By Mouth Once A Day 8)  Lovaza 1 Gm  Caps (Omega-3-Acid Ethyl Esters) .... Take 2 Tabs By Mouth Two Times A Day 9)  Allopurinol 300 Mg  Tabs (Allopurinol) .... Take 1 Tablet By Mouth Once A Day 10)  Clonidine Hcl 0.1 Mg  Tabs (Clonidine Hcl) .Marland Kitchen.. 1 By Mouth Two Times A Day 11)  Niaspan 1000 Mg  Tbcr (Niacin (Antihyperlipidemic)) .... Take 1 Tablet By Mouth Once A Day 12)  Aspirin Low Dose 81 Mg  Tabs (Aspirin) .Marland KitchenMarland KitchenMarland Kitchen  Take 1 Tablet By Mouth Once A Day 13)  Gabapentin 300 Mg  Caps (Gabapentin) .... Take 1 Tablet By Mouth Three Times A Day 14)  Glimepiride 2 Mg  Tabs (Glimepiride) .Marland Kitchen.. 1 By Mouth Two Times A Day 15)  Tylenol Extra Strength 500 Mg  Tabs (Acetaminophen) .... Take By Mouth As Needed 16)  Tylenol Pm Extra Strength 500-25 Mg  Tabs (Diphenhydramine-Apap (Sleep)) .... Use As Needed 17)  Colchicine 0.6 Mg  Tabs (Colchicine) .... Take By Mouth As Needed 18)  Hydrocodone/ Apap .... Take 1 Tab By Mouth Every 4 To 6 Hours As Needed 19)  Januvia 100 Mg  Tabs  (Sitagliptin Phosphate) .Marland Kitchen.. 1 By Mouth Daily 20)  Levemir 100 Unit/ml Soln (Insulin Detemir) .... As Directed  Allergies (verified): 1)  ! * Lyrica  Past History:  Past Medical History: Last updated: 11/08/2009 Current Problems:  HYPERLIPIDEMIA (ICD-272.4) SLEEP APNEA (ICD-780.57) Hx of BLADDER CANCER (ICD-188.9) PROSTATE CANCER, HX OF (ICD-V10.46) PERIPHERAL VASCULAR DISEASE (ICD-443.9) DIVERTICULITIS, HX OF (ICD-V12.79) DEGENERATIVE JOINT DISEASE (ICD-715.90) CONSTIPATION, CHRONIC (ICD-564.09) Diabetes mellitus.  Hypertension.  Gouty arthritis.  History of cervical disk prolapse.  History of carcinoma of the prostate.  Asymptomatic severe right internal carotid artery stenosis OSA- BiPAP obesity  Past Surgical History: Last updated: 11/08/2009 Status post cervical disk fusion.   Risk Factors: Alcohol Use: <1 (11/23/2009) Caffeine Use: 2 cups (11/23/2009) Exercise: very little (11/23/2009)  Risk Factors: Smoking Status: current (11/23/2009) Packs/Day: 1.0 (11/23/2009)  Social History: Alcohol drinks/day:  <1 Smoking Status:  current Packs/Day:  1.0 Caffeine use/day:  2 cups Does Patient Exercise:  very little  Review of Systems       The patient complains of weight gain and peripheral edema.  The patient denies fever, weight loss, vision loss, decreased hearing, hoarseness, chest pain, syncope, dyspnea on exertion, prolonged cough, abdominal pain, incontinence, muscle weakness, transient blindness, difficulty walking, depression, unusual weight change, and enlarged lymph nodes.         [place] of the right LE  Vital Signs:  Patient profile:   65 year old male Height:      69 inches Weight:      227.25 pounds BMI:     33.68 Pulse rate:   78 / minute Pulse rhythm:   regular BP sitting:   168 / 64  (left arm) Cuff size:   regular  Vitals Entered By: Mercer Pod (November 23, 2009 2:57 PM)  Physical Exam  General:  obese gentleman in no apparent  distress, HEENT exam is benign, oropharynx is clear, neck is supple with 1+ carotid bruit on the right, no JVP, heart sounds are regular with normal S1 and S2 with 1/6 systolic ejection murmur heard at the right sternal border, lungs are clear though mildly diminished throughout, moderate obesity otherwise nontender, nondistended. Trace lower extremity edema below the knee on the right with no edema on the left, pulses are equal and symmetrical in his upper and lower extremities, neurologic exam is grossly nonfocal, skin is warm and dry.   New Orders:     1)  Carotid Duplex (Carotid Duplex)  Due: 04/17/2010     2)  T-Lipid Profile ([id number])     3)  T-Comprehensive Metabolic Panel ([id number])     4)  T-Hgb A1C ([id number])   EKG  Procedure date:  11/23/2009  Findings:      normal sinus rhythm with rate 78 beats per minute, no significant ST or T wave  Impression & Recommendations:  Problem # 1:  CAROTID ARTERY STENOSIS, RIGHT (ICD-433.10) severe carotid arterial disease noted on the right, last ultrasound in November 2010. We will reschedule him for ultrasound in 6 months time with a clinic visit at that time.  His updated medication list for this problem includes:    Plavix 75 Mg Tabs (Clopidogrel bisulfate) .Marland Kitchen... Take 1 tablet by mouth once a day    Aspirin Low Dose 81 Mg Tabs (Aspirin) .Marland Kitchen... Take 1 tablet by mouth once a day  Future Orders: Carotid Duplex (Carotid Duplex) ... 04/17/2010  Problem # 2:  OVERWEIGHT/OBESITY (ICD-278.02) We have talked to him about his weight. He is limited in his ability to exercise as if he does weightbearing, he reports having flares of gout. He will need to try alternate forms of exercise such as biking or swimming.  Problem # 3:  HYPERTENSION, BENIGN (ICD-401.1) his blood pressure is elevated today with systolic of 170, diastolic of 85. It maintained a high level even on repeat evaluation. I am uncertain if he is taking his medications as he  states trying to cut back due to the cost of the co-pay. He swears that he takes the medications consistently. We'll increase his exforge to 10/320 mg daily as he states only taking 5/320 mg daily. we'll also increase his clonidine to 0.2 mg b.i.d. I suggested to him that he will need to followup with Dr.  Lasso call in the next 2 weeks for blood pressure check.  He has asked [place] to renew his Plavix and Lasix. I have given him some samples of Crestor 10 mg daily and samples of Plavix.  His updated medication list for this problem includes:    Furosemide 40 Mg Tabs (Furosemide) .Marland Kitchen... Take 1 tablet by mouth once a day    Labetalol Hcl 300 Mg Tabs (Labetalol hcl) .Marland Kitchen... Take 1 tablet by mouth two times a day    Exforge 10-320 Mg Tabs (Amlodipine besylate-valsartan) .Marland Kitchen... 1 tab by mouth daily    Clonidine Hcl 0.2 Mg Tabs (Clonidine hcl) .Marland Kitchen... 1 tab by mouth two times a day    Aspirin Low Dose 81 Mg Tabs (Aspirin) .Marland Kitchen... Take 1 tablet by mouth once a day  Orders: T-Comprehensive Metabolic Panel ([id number])  Problem # 4:  HYPERLIPIDEMIA (ICD-272.4) we'll try to obtain a recent cholesterol panel though he states that it has been sometime since his last level. His goal given his peripheral vascular disease for his cholesterol is less than 150, LDL less than 70.  If he has not had one recently, we will give him a lab slip to have it checked at [facility] in [place].  His updated medication list for this problem includes:    Crestor 5 Mg Tabs (Rosuvastatin calcium) .Marland Kitchen... Take 1 tablet by mouth once a day    Lovaza 1 Gm Caps (Omega-3-acid ethyl esters) .Marland Kitchen... Take 2 tabs by mouth two times a day    Niaspan 1000 Mg Tbcr (Niacin (antihyperlipidemic)) .Marland Kitchen... Take 1 tablet by mouth once a day  Orders: T-Lipid Profile ([id number])  Other Orders: T-Hgb A1C ([id number])  Patient Instructions: 1)  Your physician recommends that you schedule a follow-up appointment in: 6 months 2)  Your physician  recommends that you return for lab work in: at your convenience if not done by Dr. Margo Aye 3)  Your physician has recommended you make the following change in your medication: start chantix, increase clonidine to 0.2 mg tiwce a day, increase exforge to 10/320 daily 4)  Your physician  has requested that you have a carotid duplex. This test is an ultrasound of the carotid arteries in your neck. It looks at blood flow through these arteries that supply the brain with blood. Allow one hour for this exam. There are no restrictions or special instructions. in 6 months on the same day you see Dr. Mariah Milling Prescriptions: CHANTIX CONTINUING MONTH PAK 1 MG TABS (VARENICLINE TARTRATE) as directed  #3 x 0   Entered by:   Charlena Cross, RN, BSN   Authorized by:   Dossie Arbour MD   Signed by:   Charlena Cross, RN, BSN on 11/23/2009   Method used:   Electronically to        [facility] (retail)       [address]       [place], [place]  [address]       Ph: [phone]       Fax: [phone]   RxID:   [id number] CHANTIX STARTING MONTH PAK 0.5 MG X 11 & 1 MG X 42 TABS (VARENICLINE TARTRATE) as directed  #1 x 0   Entered by:   Charlena Cross, RN, BSN   Authorized by:   Dossie Arbour MD   Signed by:   Charlena Cross, RN, BSN on 11/23/2009   Method used:   Electronically to        [facility] (retail)       [address]       [place], [place]  [address]       Ph: [phone]       Fax: [phone]   RxID:   [id number] CLONIDINE HCL 0.2 MG TABS (CLONIDINE HCL) 1 tab by mouth two times a day  #60 x 6   Entered by:   Charlena Cross, RN, BSN   Authorized by:   Dossie Arbour MD   Signed by:   Charlena Cross, RN, BSN on 11/23/2009   Method used:   Electronically to        [facility] (retail)       [address]       [place], [place]  [address]       Ph: [phone]       Fax: [phone]   RxID:    [id number] EXFORGE 10-320 MG TABS (AMLODIPINE BESYLATE-VALSARTAN) 1 tab by mouth daily  #30 x 6   Entered by:   Charlena Cross, RN, BSN   Authorized by:   Dossie Arbour MD   Signed by:   Charlena Cross, RN, BSN on 11/23/2009   Method used:   Electronically to        [facility] (retail)       [address]       [place], [place]  [address]       Ph: [phone]       Fax: [phone]   RxID:   [id number] PLAVIX 75 MG  TABS (CLOPIDOGREL BISULFATE) Take 1 tablet by mouth once a day  #30 x 6   Entered by:   Charlena Cross, RN, BSN   Authorized by:   Dossie Arbour MD   Signed by:   Charlena Cross, RN, BSN on 11/23/2009   Method used:   Electronically to  [facility] (retail)       [address]       [place], [place]  [address]       Ph: [phone]       Fax: [phone]   RxID:   [id number] FUROSEMIDE 40 MG  TABS (FUROSEMIDE) Take 1 tablet by mouth once a day  #30 x 6   Entered by:   Charlena Cross, RN, BSN   Authorized by:   Dossie Arbour MD   Signed by:   Charlena Cross, RN, BSN on 11/23/2009   Method used:   Electronically to        [facility] (retail)       [address]       [place], [place]  [address]       Ph: [phone]       Fax: [phone]   RxID:   [id number]

## 2010-10-17 NOTE — Letter (Signed)
Summary: Medication list  Medication list   Imported By: Jacklynn Ganong 12/16/2009 08:08:04  _____________________________________________________________________  External Attachment:    Type:   Image     Comment:   External Document

## 2010-10-17 NOTE — Progress Notes (Signed)
Summary: Stop Plavix and Aspirin   Phone Note Outgoing Call   Call placed by: Benedict Needy, RN,  June 14, 2010 12:23 PM Call placed to: Patient Summary of Call: Dr. Baldomero Lamy [phone]. Pt called stating that he was told that he could stop his plavix and aspirin for surgery. Called pt to find out who told him b/c there is no documention of that. LMOM TCB.  Initial call taken by: Benedict Needy, RN,  June 14, 2010 12:25 PM  Follow-up for Phone Call        Would like to know if you can fax an okay for Nakia to stop plavix and aspirin 7 days prior to brain surgery on Oct. 27 , 2011 at [facility].  Ph# [phone] Fax#[phone] Follow-up by: Benedict Needy, RN,  June 14, 2010 2:53 PM     Appended Document: Stop Plavix and Aspirin  Ok to come off ASA and plavix. If surgery is elective would let us know and consider delaying. If he has to have surgery, ok to stop meds.   Appended Document: Stop Plavix and Aspirin  note faxed to Dr Baldomero Lamy

## 2010-11-28 LAB — DIFFERENTIAL
Basophils Relative: 1 % (ref 0–1)
Lymphocytes Relative: 8 % — ABNORMAL LOW (ref 12–46)
Monocytes Relative: 7 % (ref 3–12)
Neutro Abs: 11.2 10*3/uL — ABNORMAL HIGH (ref 1.7–7.7)
Neutrophils Relative %: 85 % — ABNORMAL HIGH (ref 43–77)

## 2010-11-28 LAB — COMPREHENSIVE METABOLIC PANEL
Albumin: 3.9 g/dL (ref 3.5–5.2)
Alkaline Phosphatase: 55 U/L (ref 39–117)
BUN: 14 mg/dL (ref 6–23)
Calcium: 9.7 mg/dL (ref 8.4–10.5)
Creatinine, Ser: 0.92 mg/dL (ref 0.4–1.5)
Glucose, Bld: 158 mg/dL — ABNORMAL HIGH (ref 70–99)
Potassium: 3.6 mEq/L (ref 3.5–5.1)
Total Protein: 8.4 g/dL — ABNORMAL HIGH (ref 6.0–8.3)

## 2010-11-28 LAB — CBC
HCT: 36.1 % — ABNORMAL LOW (ref 39.0–52.0)
MCH: 31.5 pg (ref 26.0–34.0)
MCHC: 33.3 g/dL (ref 30.0–36.0)
MCV: 94.6 fL (ref 78.0–100.0)
Platelets: 232 10*3/uL (ref 150–400)
RDW: 13.7 % (ref 11.5–15.5)

## 2010-11-28 LAB — WOUND CULTURE

## 2010-11-28 LAB — CULTURE, BLOOD (ROUTINE X 2): Culture: NO GROWTH

## 2010-12-18 ENCOUNTER — Other Ambulatory Visit: Payer: Self-pay | Admitting: Cardiology

## 2010-12-18 DIAGNOSIS — I6529 Occlusion and stenosis of unspecified carotid artery: Secondary | ICD-10-CM

## 2010-12-19 ENCOUNTER — Encounter (INDEPENDENT_AMBULATORY_CARE_PROVIDER_SITE_OTHER): Payer: MEDICARE | Admitting: *Deleted

## 2010-12-19 ENCOUNTER — Encounter: Payer: Self-pay | Admitting: Cardiovascular Disease

## 2010-12-19 ENCOUNTER — Ambulatory Visit (INDEPENDENT_AMBULATORY_CARE_PROVIDER_SITE_OTHER): Payer: MEDICARE | Admitting: Cardiovascular Disease

## 2010-12-19 DIAGNOSIS — E663 Overweight: Secondary | ICD-10-CM

## 2010-12-19 DIAGNOSIS — I6529 Occlusion and stenosis of unspecified carotid artery: Secondary | ICD-10-CM

## 2010-12-19 DIAGNOSIS — I10 Essential (primary) hypertension: Secondary | ICD-10-CM

## 2010-12-19 DIAGNOSIS — E119 Type 2 diabetes mellitus without complications: Secondary | ICD-10-CM

## 2010-12-19 DIAGNOSIS — I739 Peripheral vascular disease, unspecified: Secondary | ICD-10-CM

## 2010-12-19 DIAGNOSIS — E785 Hyperlipidemia, unspecified: Secondary | ICD-10-CM

## 2010-12-19 MED ORDER — LOSARTAN POTASSIUM 100 MG PO TABS: 100.0000 mg | ORAL_TABLET | Freq: Every day | ORAL | Status: DC

## 2010-12-19 MED ORDER — AMLODIPINE BESYLATE 5 MG PO TABS: 5.0000 mg | ORAL_TABLET | Freq: Every day | ORAL | Status: DC

## 2010-12-19 NOTE — Assessment & Plan Note (Signed)
Blood pressure is elevated. He finds the Diovan very expensive. We'll change this to losartan 100 mg daily with amlodipine 5 mg daily.

## 2010-12-19 NOTE — Assessment & Plan Note (Signed)
We have encouraged continued exercise, careful diet management in an effort to lose weight and for diabetes control.

## 2010-12-19 NOTE — Patient Instructions (Signed)
After you complete Diovan, START Losartan 100mg  once daily and Amlodipine 5mg  once daily. Your physician recommends that you schedule a follow-up appointment in: 6 months, we will call you with this appt.

## 2010-12-19 NOTE — Assessment & Plan Note (Signed)
We have encouraged him to work on his diet and exercise. He is significantly overweight.

## 2010-12-19 NOTE — Progress Notes (Signed)
   Patient ID: [name], male    DOB: Oct 02, 1945, 65 y.o.   MRN: [id number]  HPI Comments: 65 year old male with a history of obstructive sleep apnea, peripheral vascular disease with severe right internal carotid arterial stenosis estimated at 70 to 80%, diabetes, hypertension, hyperlipidemia, obesity, history of cervical spine surgery and prostate cancer with treatment who presents for routine followup.   Mr. Januszewski states that he stopped smoking approximately 3 years ago. He did smoke for a short period around December.   He reports that his blood pressure has been running high. Typically he has a systolic pressure 140-160. He has been taking Diovan alone. His exforge and clonidine were previously held. He does not seem particularly bothered by it though he does comment on the cost of Diovan.  Otherwise he feels well with no significant symptoms of shortness of breath or chest discomfort.   EKG shows normal sinus rhythm with rate of 75 beats per minute, no significant ST or T wave changes.      Review of Systems  Constitutional: Negative.   HENT: Negative.   Eyes: Negative.   Respiratory: Negative.   Cardiovascular: Negative.   Gastrointestinal: Negative.   Musculoskeletal: Negative.   Skin: Negative.   Neurological: Negative.   Hematological: Negative.   Psychiatric/Behavioral: Negative.   All other systems reviewed and are negative.   BP 162/82  Pulse 75  Ht 5\' 8"  (1.727 m)  Wt 210 lb 12.8 oz (95.618 kg)  BMI 32.05 kg/m2    Physical Exam  Nursing note and vitals reviewed. Constitutional: He is oriented to person, place, and time. He appears well-developed and well-nourished.  HENT:  Head: Normocephalic.  Nose: Nose normal.  Mouth/Throat: Oropharynx is clear and moist.  Eyes: Conjunctivae are normal. Pupils are equal, round, and reactive to light.  Neck: Normal range of motion. Neck supple. No JVD present.  Cardiovascular: Normal rate, regular rhythm, normal  heart sounds and intact distal pulses.  Exam reveals no gallop and no friction rub.   No murmur heard. Pulmonary/Chest: Effort normal and breath sounds normal. No respiratory distress. He has no wheezes. He has no rales. He exhibits no tenderness.  Abdominal: Soft. Bowel sounds are normal. He exhibits no distension. There is no tenderness.  Musculoskeletal: Normal range of motion. He exhibits edema. He exhibits no tenderness.  Lymphadenopathy:    He has no cervical adenopathy.  Neurological: He is alert and oriented to person, place, and time. Coordination normal.  Skin: Skin is warm and dry. No rash noted. No erythema.  Psychiatric: He has a normal mood and affect. His behavior is normal. Judgment and thought content normal.           Assessment and Plan

## 2010-12-19 NOTE — Assessment & Plan Note (Signed)
We'll continue aggressive lipid management. Goal LDL less than 70.

## 2010-12-19 NOTE — Assessment & Plan Note (Signed)
Last carotid ultrasound was September 2011. Repeat in one year.

## 2010-12-26 ENCOUNTER — Encounter: Payer: Self-pay | Admitting: Cardiovascular Disease

## 2010-12-27 ENCOUNTER — Telehealth: Payer: Self-pay | Admitting: Emergency Medicine

## 2010-12-27 LAB — GLUCOSE, CAPILLARY: Glucose-Capillary: 122 mg/dL — ABNORMAL HIGH (ref 70–99)

## 2010-12-27 NOTE — Telephone Encounter (Signed)
LMOM regarding carotid u/s. Stable u/s, 60-79% RICA stenosis, 0-39 % LICA stenosis. F/u 6 months

## 2011-01-04 NOTE — Telephone Encounter (Signed)
Pt notified through letter

## 2011-01-30 NOTE — Procedures (Signed)
CAROTID DUPLEX EXAM   INDICATION:  Re-evaluation of a right carotid.   HISTORY:  Diabetes:  No.  Cardiac:  No.  Hypertension:  Yes.  Smoking:  Previous.  Previous Surgery:  No.  CV History:  Asymptomatic.  Amaurosis Fugax No, Paresthesias No, Hemiparesis No                                       RIGHT             LEFT  Brachial systolic pressure:  Brachial Doppler waveforms:  Vertebral direction of flow:        Not well visualized  DUPLEX VELOCITIES (cm/sec)  CCA peak systolic                   70  ECA peak systolic                   112  ICA peak systolic                   214  ICA end diastolic                   81  PLAQUE MORPHOLOGY:                  Mixed  PLAQUE AMOUNT:                      Moderate  PLAQUE LOCATION:                    ICA and ECA   IMPRESSION:  60%-79% stenosis noted in the right internal carotid  artery.   ___________________________________________  Quita Skye Hart [place], M.D.   CJ/MEDQ  D:  08/10/2009  T:  08/10/2009  Job:  [id number]

## 2011-01-30 NOTE — Assessment & Plan Note (Signed)
[facility]                         GASTROENTEROLOGY OFFICE NOTE   NAME:[name], [name]                       MRN:          [id number]  DATE:12/24/2007                            DOB:          17-Sep-1946    The patient is a 65 year old gentleman who comes for follow-up of  diverticulitis.  He was seen as an acute work-in by Mike Gip, PA-C  on November 12, 2007, with impression that he has left-sided abdominal  discomfort consistent with diverticulitis.  He was put on Cipro and  Flagyl for 10 days with complete resolution of his symptoms within 48  hours of the antibiotics.  He was also given Analpram cream for the anal  irritation.  The CT scan of the abdomen and pelvis did not show any  evidence of acute diverticulitis, but did confirm presence of  diverticuli.  He has had chronic constipation for which he used to take  herbal cleaner that seemed to have been more effective than MiraLax.  He  contributes his constipation to multiple medications such as diuretics  and blood pressure medications.  He denies any rectal bleeding.   MEDICATIONS:  All reviewed and listed in the chart.   PHYSICAL EXAMINATION:  VITAL SIGNS:  Blood pressure 162/80, pulse 80,  weight 231 pounds.  GENERAL:  The patient was clearly overweight, alert and oriented, and in  no distress.  LUNGS:  Normal breath sounds.  HEART:  Normal S1 and S2.  ABDOMEN:  Large and protuberant with normal active bowel sounds.  I  could not appreciate the liver edge or splenic tip.  Lower abdomen was  normal with mild tenderness in the left lower quadrant on deep pressure.  No rebound and no palpable mass.  RECTAL:  Anoscopic examination today shows a small first grade  hemorrhoids, normal rectal tone, no external hemorrhoidal tags.  Stool  was soft and hemoccult negative.   IMPRESSION:  27. A 65 year old white male with resolving left-sided diverticulitis,      not confirmed by CT scan of  the abdomen, but clinically improved on      antibiotics.  2. Chronic functional constipation which could be secondary to      multiple medications.  3. Severe degenerative joint disease of the low back and left hip.  4. Peripheral vascular disease.  5. History of prostate cancer, status post radiation seeds.  6. History of bladder cancer.  7. Sleep apnea with the patient on CPAP at night.  8. Hyperlipidemia.   PLAN:  1. Samples of Analpram given to the patient.  2. Recall for colonoscopy in April 2010.  3. Continue stool softeners.     [name]. Ricky Chance, MD  Electronically Signed    DMB/MedQ  DD: 12/24/2007  DT: 12/24/2007  Job #: [id number]   cc:   Catalina Pizza, M.D.

## 2011-01-30 NOTE — Discharge Summary (Signed)
[name], [name]                ACCOUNT NO.:  [id number]   MEDICAL RECORD NO.:  [id number]          PATIENT TYPE:  INP   LOCATION:  A331                          FACILITY:  [facility]   PHYSICIAN:  Catalina Pizza, M.D.        DATE OF BIRTH:  1946/07/08   DATE OF ADMISSION:  02/07/2008  DATE OF DISCHARGE:  05/26/2009LH                               DISCHARGE SUMMARY   DISCHARGE DIAGNOSES:  1. Right lower extremity cellulitis.  2. Diabetes mellitus type 2.  3. Hypertension.  4. Cervical disk prolapse and cervical disk fusion with chronic neck      pain.   DISCHARGE MEDICATIONS:  1. Clonidine 0.1 mg b.i.d.  2. Allopurinol 300 mg p.o. nightly.  3. Niaspan 1000 mg p.o. nightly.  4. Crestor 5 mg p.o. daily.  5. Lasix 40 mg p.o. daily.  6. Plavix 75 mg p.o. daily.  7. Neurontin 300 mg t.i.d.  8. Aspirin 81 mg p.o. nightly.  9. Glimepiride 1 mg b.i.d.  10.Colchicine 0.6 mg p.o. b.i.d. p.r.n. for gout.  11.Tylenol 500 mg p.o. q.6 h. p.r.n. for pain.  12.Vicodin 5/500 p.o. q.6 h. p.r.n. for pain.  13.Started on Keflex 500 mg p.o. t.i.d. x10 days.   BRIEF HOSPITAL COURSE:  [name] is a 65 year old gentleman who after 3  or 4 days prior to admission started having right lower extremity pain  and redness, came in to the emergency department over the weekend, and  was started on IV antibiotics for cellulitis.   PHYSICAL EXAMINATION AT THE TIME OF DISCHARGE:  Temperature is 98.6,  blood pressure 138/69, pulse 67, respirations 20, and satting 96% on  room air.  GENERAL:  This is an obese white male, sitting in chair, in no acute  distress.  HEENT:  Unremarkable.  Pupils are equal, round, and react to light and  accommodation.  LUNGS:  Clear to auscultation bilaterally.  CARDIOVASCULAR:  Regular rate and rhythm.  No murmurs, gallops, or rubs.  ABDOMEN:  Soft, nontender, and nondistended.  Positive bowel sounds.  MUSCULOSKELETAL:  5/5 strength in all extremities.  Does have decreased  range  of motion in the neck secondary to cervical spine fusion.  NEUROLOGIC:  The patient is alert and oriented x3.  No deficits noted.  Moving all extremities without difficulty.  EXTREMITIES:  Does have some mild erythema and some trace edema in the  right leg, which is improved somewhat, this is improved from day to day.   LABORATORY DATA:  CBGs were 138, 133, 195, and 157 respectively.  White  count is 10.9, hemoglobin is 12.1, and platelet count 151.  BMET; sodium  142, potassium 4.9, chloride 107, CO2 of 28, glucose 128, BUN of 21,  creatinine of 1.03, and calcium of 9.3.  Blood cultures x2 are negative  to date.   RADIOGRAPHIC FINDINGS:  He had a venous Doppler done of the right leg,  which did not reveal any evidence of right lower extremity DVT.   HOSPITAL COURSE PER PROBLEM:  1. Right lower extremity cellulitis.  The patient was started on  Unasyn, and quickly erythema and pain improved dramatically.  We      will continue with Keflex 500 mg t.i.d. x10 days.  The patient is      alert, if any changes in his leg occur, any fever, or any worsening      redness or pain.  2. Diabetes mellitus type 2.  Continue on all his medicines.  His      blood sugars have been running pretty good while in the hospital.      He is to continue watch his diet as far as his sugars and take all      his medicines as previously prescribed.  3. Hypertension.  His blood pressure had been up and down, but not      severely elevated.  He is to continue on all his medicines.  He has      a very difficult to treat hypertension.  We will continue on all      his medicines.  4. Hyperlipidemia.  Continue on the Crestor and Niaspan, as previously      working well.   DISPOSITION:  The patient will be discharged to home and will need to  follow up in the office in approximately 10-12 days and to recheck the  leg again.      Catalina Pizza, M.D.  Electronically Signed     ZH/MEDQ  D:  02/10/2008  T:   02/10/2008  Job:  [id number]

## 2011-01-30 NOTE — Assessment & Plan Note (Signed)
[facility]                         GASTROENTEROLOGY OFFICE NOTE   NAME:[name], [name]                       MRN:          [id number]  DATE:11/12/2007                            DOB:          1946-04-09    REFERRING PHYSICIAN:  Catalina Pizza, M.D.   PROBLEM:  Left lower quadrant pain and constipation.   HISTORY:  [name] is a pleasant 65 year old white male with multiple  medical problems.  He has a history of bladder CA and prostate CA.  Had  radiation seed implants done in December 2004.  He also has  hypertension, sleep apnea, for which he uses a CPAP machine.  Hyperlipidemia, osteoarthritis.  He has peripheral vascular disease  apparently with a carotid stenosis, for which he is on Plavix.  He was  last seen in our office by Dr. Victorino Miles several years ago and had  colonoscopy in April 2005, which showed external hemorrhoids and left-  sided diverticulosis.  He did not have any polyps at that time.  Had had  previous colonoscopy with several small polyps noted.  He was also  treated for diverticulitis, once in 2005.  We have not seen him since.   At this time, he comes in complaining of left lower quadrant discomfort,  which started approximately 2 weeks ago.  He says the pain got fairly  severe and was constant.  He became constipated around that time and  started having very hard stools.  He had called his primary care  [name] and was advised to take a bottle of magnesium citrate, which  did help.  He never had any fevers or chills that he was aware of.  No  nausea or vomiting.  No melena or hematochezia.  He says the pain has  settled down some, and he is having bowel movements currently, which he  says are still constipated, and he is still having soreness in the left  side of his abdomen, although not severe pain.  He says his energy level  has not been very good recently.  He feels weak in general.  Approximately 3 days ago, he had a  small amount of blood noted on the  tissue after a bowel movement.  Has not noted any since.   CURRENT MEDICATIONS:  1. Lasix 40 mg daily.  2. Plavix 75 daily.  3. He is on a colon cleanse supplement daily.  4. Klor-Con 20 mEq daily.  5. Labetalol 20 b.i.d.  6. Exforge 10 mg daily.  7. Crestor 5 mg daily.  8. Allopurinol 300 daily.  9. Clonidine 0.1 daily.  10.Niaspan 1000 ER nightly.  11.Aspirin 81 mg daily.  12.Gabapentin 300 t.i.d.  13.Tylenol ES p.r.n.  14.Hydrocodone p.r.n.   ALLERGIES/DRUG INTOLERANCES:  LYRICA.   FAMILY HISTORY:  Negative for colon cancer, as far as he is aware.  His  mother had breast cancer and he has a sister who had some type of cancer  in her abdomen, which required removal of part of her intestines, though  he is not certain this was colon cancer.  Mother also with diabetes.  PAST HISTORY:  As outlined above.   SOCIAL HISTORY:  The patient is married.  He is currently disabled.  He  has 4 children.  He is a nonsmoker.  Nondrinker.  Quit smoking some time  ago and says that he has gained weight since then.   REVIEW OF SYSTEMS:  Cardiovascular:  Denies any chest pain or anginal  symptoms.  Pulmonary, denies cough, shortness of breath, or sputum  production.  Genitourinary, he did check that he has noticed occasional  blood in his urine.  Musculoskeletal is pertinent for arthritic symptoms  and back pain.  GI as outlined above.  Otherwise, review of systems is  completely negative.   PHYSICAL EXAM:  Well-developed white male in no acute distress.  Height is 5 feet 9 inches, weight is 225.8, blood pressure 132/54, pulse  88.  HEENT:  Normocephalic, atraumatic.  EOMI.  PERRLA.  Sclerae anicteric.  He does have a scar on his posterior neck from a prior cervical  laminectomy and an anterior cervical incision from a fusion.  He is  somewhat kyphotic.  CARDIOVASCULAR:  Regular rate and rhythm with S1, S2.  No murmur, rub,  or gallop.  PULMONARY:   Clear to A and P.  ABDOMEN:  Soft.  He is tender in the left mid quadrant, left lower  quadrant.  There is no palpable mass.  No rebound or guarding.  His  liver is enlarged and nontender.  The right and left lobes are palpable  and somewhat nodular down 2 to 3 finger-breadths below the right costal  margin.  RECTAL:  Exam reveals brown stool, which is Hemoccult positive.  He does  have a shallow fissure anteriorly.  EXTREMITIES:  Without cyanosis, clubbing, or edema.  NEURO:  Not tested extensively, but gait is somewhat unsteady.   IMPRESSION:  74. A 65 year old male with a 2+ week history of left-sided abdominal      discomfort and constipation.  He may have some ongoing low grade      diverticulitis.  Also would need to rule out occult colon lesion.  2. Constipation, maybe multifactorial due to above and/or multiple      medications.  3. Hepatomegaly of unclear etiology.  Will need further evaluation.  4. History of bladder cancer.  5. History of prostate cancer.  6. Peripheral vascular disease on Plavix.   PLAN:  1. Check CBC with diff, CMET, and PT/PTT today.  2. Will treat for diverticulitis with Cipro 500 b.i.d. x10 days, and      Flagyl 250 q.i.d. x10 days.  3. Analpram 2.5% applied 3 times daily for his anal irrigation from      the fissure.  4. Start [name] 17g in 8 ounces of water daily for constipation.  5. Schedule CT scan of the abdomen and pelvis with contrast for      further evaluation of his hepatomegaly and left-sided abdominal      discomfort given his history of multiple malignancies.  Followup      with Dr. Lina Miles or myself in 2 weeks.      Mike Gip, PA-C  Electronically Signed      Hedwig Morton. Juanda Chance, MD  Electronically Signed   AE/MedQ  DD: 11/12/2007  DT: 11/13/2007  Job #: [id number]   cc:   Catalina Pizza, M.D.

## 2011-01-30 NOTE — H&P (Signed)
[name], [name]                ACCOUNT NO.:  [id number]   MEDICAL RECORD NO.:  [id number]          PATIENT TYPE:  INP   LOCATION:  A331                          FACILITY:  [facility]   PHYSICIAN:  Tesfaye D. Felecia Shelling, MD   DATE OF BIRTH:  September 16, 1946   DATE OF ADMISSION:  02/07/2008  DATE OF DISCHARGE:  LH                              HISTORY & PHYSICAL   CHIEF COMPLAINT:  Pain and redness of the right leg.   HISTORY OF PRESENT ILLNESS:  This is a 65 year old male patient of Dr.  Catalina Pizza, presented to the emergency room with above complaint.  The  patient had a similar episode in 2004 where he was admitted to [facility] and was treated for cellulitis.  He has been doing well and has  been in his usual state of health until about 3 days back when he  started noting redness and pain in his leg.  He had also fever and  chills.  His symptoms continued to get worse.  He came to the emergency  room where he was evaluated and he was found to have diffuse redness and  irritation of his right leg.  On his lab tests, he had significant  leukocytosis.  The patient was admitted with a diagnosis of cellulitis  and was started on IV antibiotics.   REVIEW OF SYSTEMS:  The patient has no headache, cough, shortness of  breath, chest pain, nausea, vomiting, abdominal pain, dysuria, urgency,  or frequency of urination.   PAST MEDICAL HISTORY:  1. Diabetes mellitus.  2. Hypertension.  3. Gouty arthritis.  4. History of cervical disk prolapse.  5. History of carcinoma of the prostate.  6. Status post cervical disk fusion.   CURRENT MEDICATIONS:  1. Clonidine 0.1 mg p.o. b.i.d.  2. Allopurinol 300 mg at bedtime.  3. Niaspan 1000 mg at bedtime.  4. Labetalol 300 mg b.i.d.  5. Crestor 5 mg daily.  6. Lasix 40 mg daily.  7. Plavix 75 mg daily.  8. Gabapentin 300 mg t.i.d.  9. Vicodin as needed.  10.Aspirin 81 mg at bedtime.  11.Tylenol 500 mg as needed.  12.Colchicine 0.6 mg.   PAST MEDICAL  HISTORY:  The patient has a remote history of tobacco  abuse.  No history of substance or alcohol abuse.   PHYSICAL EXAMINATION:  The patient is alert, awake, and sick looking.  VITAL SIGNS:  Blood pressure 158/85, pulse 88, respiratory rate 20,  temperature 98 degrees Fahrenheit.  HEENT:  Pupils are equal, reactive.  NECK:  Supple.  CHEST:  Decreased air entry, bilateral rhonchi.  CARDIOVASCULAR:  First and second heart sounds heard.  No murmur, no  gallop.  ABDOMEN:  Soft and lax, bowel sounds are positive.  No mass, no  organomegaly.  EXTREMITIES:  The patient has diffuse redness and tenderness with  pitting edema on the right anterior leg.   LABORATORIES ON ADMISSION:  Sodium 140, potassium 4.0, chloride 109,  carbon dioxide 25, glucose 157, BUN 40, creatinine 1.57, calcium 9.2.  CBC:  WBC 15.4, hemoglobin 12.4, hematocrit 35.5, and platelets  141.   ASSESSMENT:  1. Cellulitis of the right leg.  2. Diabetes mellitus type 2, clinically uncontrolled.  3. Hypertension.  4. History of cancer of the prostate.  5. Gouty arthritis.  6. History of cervical disk prolapse.   PLAN:  Continue the patient on IV antibiotics.  Will continue his  regular medications.  Will continue to monitor his CBC and BMP.  Continue supportive care.      Tesfaye D. Felecia Shelling, MD  Electronically Signed     TDF/MEDQ  D:  02/08/2008  T:  02/08/2008  Job:  [id number]

## 2011-01-30 NOTE — Group Therapy Note (Signed)
NAMESEENA, FACE NO.:  [id number]   MEDICAL RECORD NO.:  [id number]          PATIENT TYPE:  INP   LOCATION:  A331                          FACILITY:  [facility]   PHYSICIAN:  Catalina Pizza, M.D.        DATE OF BIRTH:  [birth year]   DATE OF PROCEDURE:  02/09/2008  DATE OF DISCHARGE:                                 PROGRESS NOTE   SUBJECTIVE:  [name] is a 65 year old obese white male with multiple  medical problems who presented with several day history of cellulitis to  the right leg.  He was started on IV Unasyn and has had great  improvement and erythema as well as pain in the right leg.  At this  point, the patient states it is much improved.  He does not have any  other significant pains other than his baseline.   OBJECTIVE:  VITAL SIGNS:  Temperature is 98.6, blood pressure 159/77,  pulse 68, respirations 20.  CBGs have been 167, 114, 140, 130, and 138  respectively.  GENERAL:  This is a obese white male sitting in chair in no acute  stress.  HEENT:  Unremarkable.  Pupils equal, round, and reactive to light and  accommodation.  LUNGS:  Clear to auscultation bilaterally.  No rhonchi or wheezing.  CARDIOVASCULAR:  Regular rate and rhythm.  No murmurs, gallops, or rubs.  ABDOMEN:  Soft, nontender, nondistended, positive bowel sounds.  EXTREMITIES:  He does have some mild erythema to the front shin area of  the right leg, still has some tenderness and some very trace pitting  edema with pain to palpation, but it does appear to be much improved  from report of previous exam by Dr. Felecia Shelling.   LABORATORY DATA:  Sodium 142, potassium 4.9, chloride 107, CO2 28,  glucose 128, BUN 21, creatinine of 1.03, calcium of 9.3.  CBC shows  white count of 10.9, hemoglobin 12.1, platelet count of 151, absolute  neutrophils 7.7, 71% neutrophils.  Blood cultures x2 are still negative  to date.   IMPRESSION:  This is a 65 year old white male with multiple medical  problems  including diabetes, severe hypertension, significant cervical  and back pain with cellulitis of the right leg.   ASSESSMENT/PLAN:  1. Cellulitis.  We will continue IV Unasyn through today and      transition over to oral antibiotics tomorrow.  We will continue      with IV fluids.  If this is having significant improvement, the      patient is to keep this elevated as much possible.  2. Diabetes mellitus type 2.  His blood sugars appear to be doing well      while in the hospital.  We will continue the current regimen for      this.  3. Hypertension.  Blood pressure still run out mildly.  We will      continue on all of his blood pressure medicines.   DISPOSITION:  The patient likely will be discharged tomorrow with  routine followup in the office in the next 2 weeks.  Catalina Pizza, M.D.  Electronically Signed     ZH/MEDQ  D:  02/09/2008  T:  02/09/2008  Job:  [id number]

## 2011-01-30 NOTE — Cardiovascular Report (Signed)
[name], [name]                ACCOUNT NO.:  [id number]   MEDICAL RECORD NO.:  [id number]          PATIENT TYPE:  AMB   LOCATION:  SDS                          FACILITY:  [facility]   PHYSICIAN:  Vonna Kotyk R. Jacinto Halim, MD       DATE OF BIRTH:  30-Apr-1946   DATE OF PROCEDURE:  01/05/2009  DATE OF DISCHARGE:  01/05/2009                            CARDIAC CATHETERIZATION   PROCEDURES PERFORMED:  1. Arch aortogram.  2. Selective right vertebral arteriogram.  3. Selective right and left carotid arteriogram both intracranial and      extracranial.   SURGEON:  Vonna Kotyk R. Jacinto Halim, MD   ASSISTANT:  Nanetta Batty, MD   INDICATION:  [name] is a 63-year gentleman with COPD, chronic  tobacco use, hypertension, hyperlipidemia who has been having carotid  duplex and this had shown progressive increase in high velocities  suggestive of high-grade stenosis of the right internal carotid artery.  Hence he was brought to the catheterization lab to evaluate his carotid  stenoses for definitive diagnosis.   Arch aortogram:  Arch aortogram revealed mild calcification of the arch  of the aorta.  It is a type 1 arch, normal and no obvious aneurysms are  cutoff sign noted.  It partially fills the left vertebral system and the  best it reaches the right vertebral system forms the basilar system, and  retrograde filling of the left posterior communicating artery was well  visualized.   Right carotid artery.  The right common carotid artery is widely patent.  At the junction of the right common carotid artery and internal carotid  artery, there is a very small aneurysmal pouch noted.   The right internal carotid artery showed a 80-85% acute angle ulcerated  stenoses.  There was no thrombus.   Intracranial portion of the right internal carotid artery was completely  normal and fills up the irregularity.  Left internal carotid artery also  shows mild luminal irregularity.   The intracerebral portion of the  left carotid system is normal and  giving origin to anterior and middle cerebral arteries.  There was no  obvious aneurysms are cutoff sign.   Left vertebral artery was patent and this was evident by arch aortogram  and was not cannulated.   IMPRESSION:  Acute angle takeoff of the right internal carotid artery.  There is a small aneurysmal pouch noted at the junction of the right  internal carotid artery and common carotid artery and there is a high-  grade 80-85% stenosis in the right internal carotid artery. 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  It may be difficult anatomy for these reasons for surgical CEA. However,  tortuosity is an issue with percutaneous approach for revascularization.   RECOMMENDATION:  We will reevaluate his peripheral anatomy.  All of the  above impressions need to be kept in before final recommendations  regarding revascularization is done.   The patient tolerated the procedure well.  No immediate complications  were noted.   TECHNIQUE OF PROCEDURE:  Under usual sterile precautions using a  5-  French right femoral artery access, a 5-French pigtail catheter was  advanced into the arch of the aorta and arch aortogram was performed in  LAO projection.  The catheter was then pulled out of body and using  Versacore wire, a JB-1 diagnostic catheter, the selective four-vessel  cerebral arteriography was performed without any complication.  The  catheter was then pulled out of body in usual fashion.  The patient  tolerated the procedure well.  No immediate complications were noted.      Cristy Hilts. Jacinto Halim, MD  Electronically Signed     JRG/MEDQ  D:  01/05/2009  T:  01/06/2009  Job:  [id number]   cc:   Antonieta Iba, MD  Catalina Pizza, M.D.

## 2011-01-30 NOTE — Consult Note (Signed)
VASCULAR SURGERY CONSULTATION   BUFFALO, [name]  DOB:  1946/05/15                                       02/03/2009  ZOXWR#:[id number]   REFERRING PHYSICIAN:  Nanetta Batty, M.D.   PRIMARY CARE PHYSICIAN:  Catalina Pizza, M.D.   REFERRAL DIAGNOSIS:  Right carotid stenosis.   HISTORY:  The patient is a 65 year old gentleman referred for an opinion  regarding management of his carotid disease.  A carotid Doppler carried  out 12/22/2008 revealed severe right internal carotid artery stenosis in  the proximal right internal carotid artery with heterogeneous plaque.  This was estimated to be 70-99%.  There was minimal disease noted in the  left carotid bifurcation.   The patient has no history of documented stroke.  Denies visual  disturbance, sensory or motor deficit.  No speech abnormalities.  No  gait abnormality.   He has had extensive cervical disk surgery carried out by Dr. Jeral Fruit  with a five level fusion.  He has limited mobility of the neck.   He has undergone cerebral arteriogram which I have reviewed.  There is a  moderate to severe stenosis noted in the proximal right internal carotid  artery.  Tortuosity of the proximal right internal carotid artery is  also present with a deep ulcer noted in the carotid bulb.   Risk factors for carotid disease include type 2 diabetes, hypertension  and hyperlipidemia.  He was a tobacco user until November of 2008.   PAST MEDICAL HISTORY:  1. Type 2 diabetes.  2. Hypertension.  3. Hyperlipidemia.  4. Remote tobacco abuse.   MEDICATIONS:  1. Clonidine 0.1 mg b.i.d.  2. Allopurinol 300 mg q.h.s.  3. Niaspan 2000 mg b.i.d.  4. Labetalol 300 mg b.i.d.  5. Klor-Con 20 daily.  6. Crestor 5 mg daily.  7. Exforge 10/320 daily.  8. Lasix 40 mg daily.  9. Lovaza 1 gram b.i.d.  10.Plavix 75 mg daily.  11.Januvia 100 mg daily.  12.Glimepiride 2 mg b.i.d.  13.Gabapentin 300 mg t.i.d.  14.Aspirin 81 mg daily.  15.Tylenol  PM two q.h.s.  16.Tylenol Extra Strength p.Miles.n.  17.Colchicine 0.6 mg p.Miles.n.  18.Vicodin 5/500 p.Miles.n.  19.Levemir 18 units q.h.s.   ALLERGIES:  None known.   SOCIAL HISTORY:  The patient is married.  He has two children.  He is a  retired Midwife.  No current tobacco use.  Discontinued tobacco  use in November of 2008.  No regular alcohol use.   FAMILY HISTORY:  Mother deceased age 59 of complications of cancer.  Father deceased age 43 with diabetes.  There is a strong family history  of coronary artery disease, hypertension and diabetes.   REVIEW OF SYSTEMS:  Refer to patient encounter form.  The patient has  symptoms of GI reflux.  Notes also dizziness on standing.  Arthritic  joint discomfort.   PHYSICAL EXAM:  General:  A 65 year old male appears his stated age.  Moderate obesity.  No distress.  Vital signs:  BP is 119/65 left arm,  117/58 right arm, pulse 72 per minute.  HEENT:  Mouth and throat are  clear.  Normocephalic.  Extraocular movements intact.  Neck:  Supple.  No thyromegaly or adenopathy.  There is obvious limited motion of the  neck due to cervical fusion with minimal ability to extend the neck or  rotate  the neck.  Chest:  Equal air entry bilaterally with distant  breath sounds.  No rales or rhonchi.  Cardiovascular:  Right carotid  bruit.  Normal heart sounds without murmurs.  No gallops or rubs.  Regular rate and rhythm.  Abdomen:  Moderate obesity.  No masses or  organomegaly.  Normal bowel sounds without bruits.  Nontender.  Extremities:  Full range of motion.  No joint deformity.  No ankle  edema.  Neurological:  Cranial nerves intact.  Strength equal  bilaterally.  1+ reflexes.   IMPRESSION:  1. Asymptomatic severe right internal carotid artery stenosis.  2. Type 2 diabetes.  3. Hypertension.  4. Hyperlipidemia.  5. Severe cervical spine disease with multilevel fusion.   RECOMMENDATION:  Upon review of arteriogram and physical exam the   patient is not a good candidate for open operative procedure due to the  high nature of his bifurcation and limited neck mobility.  Also not a  good candidate for carotid stenting secondary to tortuosity of the  vessel, moderate calcification and a deep ulcer noted in the carotid  bulb.   Taking into account the patient's asymptomatic history at this time I  would recommend conservative followup with continued antiplatelet agents  and repeat carotid Doppler.  Should the patient develop symptoms a more  aggressive approach would be warranted.  However, at this time I feel  that his asymptomatic course would best be treated by conservative  measures.   Balinda Quails, M.D.  Electronically Signed  PGH/MEDQ  D:  02/03/2009  T:  02/04/2009  Job:  [id number]   cc:   Catalina Pizza, M.D.  Nanetta Batty, M.D.

## 2011-02-02 NOTE — H&P (Signed)
[name], [name]                          ACCOUNT NO.:  [id number]   MEDICAL RECORD NO.:  [id number]                   PATIENT TYPE:  INP   LOCATION:  6711                                 FACILITY:  [facility]   PHYSICIAN:  Sherin Quarry, MD                   DATE OF BIRTH:  02/01/1946   DATE OF ADMISSION:  08/05/2003  DATE OF DISCHARGE:                                HISTORY & PHYSICAL   HISTORY OF PRESENT ILLNESS:  [name] is a 64 year old man who states  that he began feeling very bad yesterday. He states that he got out of bed  in the morning and fell down because he felt very dizzy. He continued to  feel weak, and every time he would try to stand up he would feel like he was  going to black out. Eventually, he remained in bed the rest of the day. He  experienced sweats and loss of appetite. Today, he has continued to feel  dizzy, and also his wife has noted that he has developed some redness and  warmth on the anterior aspect of his right leg. The patient has extremely  bad tinea pedis and has been seeing a dermatologist about this. He has been  putting a variety of antifungal products on his feet. On evaluation in the  emergency room, it is my suspicion that he probably has a cellulitis of the  right leg and might have evidence of sepsis secondarily causing the  dizziness and anorexia. He is therefore admitted for IV antibiotics.   PAST MEDICAL HISTORY:  There are no known drug allergies.   CURRENT MEDICATIONS:  1. Diltiazem 300 mg daily.  2. Lasix 20 mg daily.  3. Labetalol 300 mg b.i.d.  4. Lisinopril 20 mg daily.  5. Clonidine 0.2 mg at bedtime daily.  6. Celebrex 200 mg daily.  7. Colchicine 0.6 mg daily.  8. Hytrin 10 mg daily.   ILLNESSES:  1. Hypertension. The patient has a history of poorly controlled     hypertension. His blood pressure in the office is typically in the range     of 190 to 220/100. Dr. Kevan Ny has been working on this issue and has been  gradually increasing his medications. The most recent medicine that was     added was lisinopril according to the patient's wife.  2. Gout. The patient has a long standing history of gout, but this is not     currently an active problem.  3. Degenerative joint disease. He is on Celebrex therapy which seems to help     this problem.  4. Prostate cancer. The patient was diagnosed with prostate cancer and     underwent a TURP and biopsy procedure. He is to have radium seed implant     placed in the prostate gland in December.   OPERATIONS:  Otherwise none.   FAMILY HISTORY:  Noncontributory.   SOCIAL HISTORY:  The patient smokes one to one and a half packs of  cigarettes per day. He does not drink alcohol or use drugs. 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   REVIEW OF SYSTEMS:  HEAD:  He complains of a dull frontal headache. EYES:  He notes no visual blurring. EARS, NOSE, MOUTH, AND THROAT:  Denies earache,  sinus pain, or sore throat. CHEST:  Denies coughing, wheezing, or chest  congestion. CARDIOVASCULAR:  Denies orthopnea, PND, or ankle edema.  GASTROINTESTINAL:  He has not been vomiting. He has had no hematemesis or  melena. No hematochezia. No abdominal pain. GENITOURINARY:  He denies  dysuria or urinary frequency, hesitancy, or nocturia. RHEUMATOLOGIC:  Back  pain is mild. HEMATOLOGIC:  Denies easy bleeding or bruising. NEUROLOGICAL:  Denies history of seizure or stroke.   PHYSICAL EXAMINATION:  GENERAL:  He is a pleasant cooperative man who does  not feel very good. He feels quite weak and dizzy.  VITAL SIGNS:  His blood pressure is 156/78, pulse 72, respirations 12.  HEENT:  Within normal limits.  CHEST:  Clear to auscultation.  BACK:  Examination of the back reveals no CVA or point tenderness.  CARDIOVASCULAR:  Reveals normal S1 and S2 without murmurs, rubs, or gallops.  ABDOMEN:  Benign. Normal bowel sounds without masses or tenderness.   NEUROLOGICAL:  Within normal limits.  EXTREMITIES:  Examination of the extremities reveals a cellulitis of the  right anterior shin area which is reddened and feels warm to the touch. The  calf has a slightly swollen appearance. I do not see any lymphangitis.   PLAN:  The plan will be to moderate the patient's blood pressure medicine.  We probably should not stop it altogether. It would probably be a good idea  to continue clonidine and labetalol to avoid withdraw symptoms. We will give  him intervenous fluids and broad-spectrum antibiotic therapy with Zosyn.  Will follow his coarse very closely.                                                Sherin Quarry, MD    SY/MEDQ  D:  08/05/2003  T:  08/06/2003  Job:  [id number]   cc:   Duncan Dull, M.D.  [address]  [place]  [place] [address]  Fax: [phone]   Claudette Laws, M.D.  [address]  [employer]  [place] [address]  Fax: [phone]

## 2011-02-02 NOTE — H&P (Signed)
NAMEERIC, NEES NO.:  [id number]   MEDICAL RECORD NO.:  [id number]          PATIENT TYPE:  INP   LOCATION:  2859                         FACILITY:  [facility]   PHYSICIAN:  Hilda Lias, M.D.   DATE OF BIRTH:  1946-06-23   DATE OF ADMISSION:  05/11/2005  DATE OF DISCHARGE:                                HISTORY & PHYSICAL   Mr. Blanchard is a gentleman who had been seen by me more than a year ago  complaining of neck pain with radiation to right upper extremity.  At that  point, he had diagnosis of cervical stenosis with radiculopathy from C3 down  to 4-5, 5-6, 6-7.  Nevertheless, he came to see me in my office at new  location because now he could not live with the pain in the right arm.  He  is miserable.  He says the only way for him to get rid of the pain was for  him to put his hand on top of the head.  We repeated the MRI.  Yesterday he  came to my office and requested surgery.   PAST MEDICAL HISTORY:  1.  Colonoscopy.  2.  TURP.  3.  Appendectomy.  4.  Nose surgery.  5.  Some type of implant in the prostate for cancer.   SOCIAL HISTORY:  He drinks, does not smoke tobacco.   REVIEW OF SYSTEMS:  Positive for cancer of prostate, urinary incontinence,  neck pain, high blood pressure.   PHYSICAL EXAMINATION:  GENERAL:  He is quite miserable.  He is unable to  sit.  He keeps his hand on the top of his head.  HEAD, EYES, EARS, NOSE, AND THROAT:  Normal.  NECK:  Completely frozen because of pain.  LUNGS:  Clear.  HEART:  Sounds normal.  ABDOMEN:  Normal.  EXTREMITIES:  Normal.  NEUROLOGIC:  Mental status normal.  Cranial nerves normal.  Strength: He has  weakness in deltoid, biceps, triceps bilaterally, right worse on the left.  Reflexes 4+ in upper extremities, 3+ in lower extremity and no Babinski.   The MRI showed that indeed he has significant herniated disk disease with  stenosis at C3-4 down to C6-7.   CLINICAL IMPRESSION:  Cervical stenosis from  C3 to C6-7.   RECOMMENDATIONS:  The patient wishes to proceed with surgery.  The procedure  will be a 4-level cervical diskectomy.  The surgery was fully explained to  him, told of risks including infection, paralysis, damage to the vocal cord,  damage to cervical, need for further surgery, no improvement whatsoever  __________.           ______________________________  Hilda Lias, M.D.     EB/MEDQ  D:  05/11/2005  T:  05/11/2005  Job:  [id number]

## 2011-02-02 NOTE — Op Note (Signed)
[name], [name]                          ACCOUNT NO.:  [id number]   MEDICAL RECORD NO.:  [id number]                   PATIENT TYPE:  AMB   LOCATION:  NESC                                 [facility]ACILITY:  [facility]   PHYSICIAN:  Claudette Laws, M.D.               DATE OF BIRTH:  July 29, 1946   DATE OF PROCEDURE:  08/19/2003  DATE OF DISCHARGE:                                 OPERATIVE REPORT   PREOPERATIVE DIAGNOSES:  1. Carcinoma of the prostate gland.  2. Benign prostatic hypertrophy.  3. History of hypertension.   POSTOPERATIVE DIAGNOSES:  1. Carcinoma of the prostate gland.  2. Benign prostatic hypertrophy.  3. History of hypertension.  4. 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   OPERATION:  1. Transperineal radioactive seed implantation I125.  2. Cold cup biopsy and fulguration papillary bladder tumor right lateral     wall.   SURGEON:  Claudette Laws, M.D.   DESCRIPTION OF PROCEDURE:  The patient was prepped and draped in the dorsal  lithotomy position under LMA anesthesia. A rectal tube was placed as was a  Foley catheter. The rectal probe ultrasound probe was placed per Maryln Gottron, M.D., appropriate scans were obtained and then transperineally  radioactive seeds were placed as per the preplanning procedure. At the end  of the implantation, the Foley catheter was removed, cystoscopy was  performed revealing what proved to be an incidental small papillary lesion  just above the right ureteral orifice inside the bladder neck. A cold cup  biopsy was performed and then the base was fulgurated with a Bugbee  electrode at 40 coag. Also noted was the fact that one strand had  inadvertently punctured through the base of the prostate and this was  removed with flexible alligator forceps. This strand happened to be  anteriorly. At the conclusion, a #16 [place] 10 mL Foley catheter was placed  and the patient was taken back to the recovery room having tolerated  the  procedure well.                                               Claudette Laws, M.D.    RFS/MEDQ  D:  08/19/2003  T:  08/19/2003  Job:  [id number]

## 2011-02-02 NOTE — Op Note (Signed)
[name], [name]                ACCOUNT NO.:  [id number]   MEDICAL RECORD NO.:  [id number]          PATIENT TYPE:  INP   LOCATION:  2859                         FACILITY:  [facility]   PHYSICIAN:  Hilda Lias, M.D.   DATE OF BIRTH:  1945/09/23   DATE OF PROCEDURE:  05/11/2005  DATE OF DISCHARGE:                                 OPERATIVE REPORT   PREOPERATIVE DIAGNOSIS:  C3-4, 4-5, 5-6, 6-7 stenosis with early cervical  myelopathy, acute radiculopathy 6-7, right side.   POSTOPERATIVE DIAGNOSIS:  C3-4, 4-5, 5-6, 6-7 stenosis with early cervical  myelopathy, acute radiculopathy 6-7, right side.   OPERATION PERFORMED:  Anterior 3-4, 4-5, 5-6, 6-7 diskectomy, decompression  of the spinal cord, removal of a large fragment at the level 5-6, 6-7 to the  right.  Interbody fusion with allograft, plate from C3 to C7, microscope.   SURGEON:  Hilda Lias, M.D.   ASSISTANT:  Clydene Fake, M.D.   ANESTHESIA:  General.   INDICATIONS FOR PROCEDURE:  The patient was seen by me a year ago and at  that time, I advised him to __________ at that point.  Nevertheless, he came  several days ago complaining of worsening of pain in the right upper  extremity, difficulty with balance.  X-ray showed worsening of the herniated  disk and foraminal stenosis as well as central stenosis at the level 3-4, 4-  5, 5-6, and 6-7.  Surgery was advised.  The risks were explained in the  history and physical.   DESCRIPTION OF PROCEDURE:  The patient was taken to the operating room.  After intubation, the neck was prepped with Betadine.  A longitudinal  incision was made through the skin and subcutaneous tissue and down to  cervical spine.  X-rays showed that we were at the level 4-5.  From then on,  we opened the anterior ligament at 3-4, 4-5, 5-6 and 6-7.  We started  working away from the __________.  Patient at the level C6-C7 had quite a  bit of degenerative disk disease.  We opened the posterior ligament  which  was calcified and indeed, there was a large fragment with disk going to the  right side.  Decompression was achieved.  The same procedure was done at the  level 5-6 but at the level 6 we found there was quite a bit of narrowing of  the canal on the right side with thinning of the dura mater.  Although there  was no frank CSF, nevertheless we used Tisseel to protect the area.  Decompression was achieved bilaterally as well as the spinal cord.  The same  findings was found at the level 4-5 and 3-4.  Having good decompression,  three pieces of 6 mm allograft and one 7 were inserted.  In the middle of  the allograft we used autograft as well as BMX.  Then a plate from C3 to C7  was made using tightening screws.  Lateral C-spine showed  that from C3 to C5-6 it was normal.  We were unable to see the lower part.  Then the area  was irrigated.  The esophagus was normal and then drain was  left in the prevertebral space.  The wound was closed with Vicryl.  The  patient is going to go to the intensive care unit at least overnight.           ______________________________  Hilda Lias, M.D.     EB/MEDQ  D:  05/11/2005  T:  05/13/2005  Job:  [id number]

## 2011-02-02 NOTE — Op Note (Signed)
NAMEAXL, RODINO NO.:  [id number]   MEDICAL RECORD NO.:  [id number]          PATIENT TYPE:  INP   LOCATION:  3109                         FACILITY:  [facility]   PHYSICIAN:  Hilda Lias, M.D.   DATE OF BIRTH:  1945/11/03   DATE OF PROCEDURE:  06/05/2005  DATE OF DISCHARGE:                                 OPERATIVE REPORT   PREOPERATIVE DIAGNOSIS:  Cervical spondylosis with stenosis, failure of the  anterior fusion.   POSTOPERATIVE DIAGNOSIS:  Cervical spondylosis with stenosis, failure of the  anterior fusion.   OPERATION PERFORMED:  Anterior approach first stage removal of the cage,  removal of the plate from C3 to C7.  Corpectomy of C7 secondary to fracture  secondary to displacement of the cage.  Insertion of allograft fibula from  C3 to T1 with plates from C3 to T1 using four screws.  At the second stage,  posterior decompression, bilateral laminectomy at 4, 5, 6, partial of 3,  posterolateral arthrodesis with posterolateral screws from C3 to T1  bilaterally.  C-arm.   SURGEON:  Hilda Lias, M.D.   ASSISTANT:  Payton Doughty, M.D.   ANESTHESIA:  General.   DESCRIPTION OF PROCEDURE:  The patient is being admitted because of  difficulty swallowing.  This gentleman underwent anterior decompression  about three weeks ago.  Yesterday he came to my office with his wife  complaining of some difficulty swallowing, nevertheless we took x-ray which  showed that indeed there was a displacement anteriorly of the cage as well  as the plate, mostly at the level of C7-T1.  Interestingly enough, although  the plate was going to the body of C7, it was mostly anterior to C7.  We  were planning for him to have a second stage later on.  Because of the x-ray  and the difficulty swallowing, we decided to bring him to the hospital for  anterior-posterior decompression.   The patient was taken to the operating room.  After intubation, he was  positioned in a Stryker  frame.  He positioned in a supine manner.  Then the  left side of the neck was prepped with Betadine.  Traction with five pounds  using [employer] tongs were applied.  The neck was prepped with Betadine  and longitudinal incision following the previous one was made until we were  able to go down straight out to the cervical area.  We removed the screws at  the level of C3 and immediately the plate and the cage came loose.  Indeed,  what we found was that the cage had broken the body of C7 and that was the  reason for displacement.  Because of that, we proceeded and did a corpectomy  of C7.  Decompression of the spinal cord was done and we had plenty of room  for the spinal cord itself.  Having done this, a fibula graft from C3 to T1  was used.  Because it was hollow, we used a mix of autograft, BMX inside.  Prior to insertion of the graft, we did traction of the head  and we were  able to introduce the graft without any problem.  Having secured the area, a  new plate longer than the previous one was used using four screws, two at  the level of C3 and two at the level of T1.  Lateral C-spine showed good  position superiorly but it was difficult to see the lower part because of  the shoulder.  Because we did quite a bit of retraction in the esophagus, we  introduced hydrogen peroxide into the esophagus to see if there was evidence  of any leak.  Once we were positive there was no leak, the area was  irrigated.  Jackson-Pratt drain was left in the prevertebral space and the  wound was closed with Vicryl and Steri-Strips.  A drain was left in the  prevertebral space as above mentioned.  Then using the rest of the Stryker  frame, the patient was flipped into prone position.  The neck was prepped  with Betadine.  Drapes were applied.  Incision from C2 down to thoracic 1-2  was made.  Muscle was retracted all the way laterally.  At the end, we had  good visualization of 2, 3, 4, 5, 6, 7 and  thoracic 1 and 2.  With the help  of C-arm we started drilling making some holes at the level of the facet  from C3 down to T1 using the landmark of the four quadrants introducing the  drill 45 degrees up.  This was done at the level of 3, 4, 5, 6, 7 and  thoracic 1 bilaterally.  Once we had the screw in place, a rod was  introduced and secured in place with __________.  This procedure was done  bilaterally.  Once we had the area well secured, we went and proceeded, we  did a laminectomy of C4, C5, C6 and partial of C3.  We decompressed the  upper part of C7 and there was plenty of space for the spinal cord.  After  that, we went laterally, we drilled the facet joint as well as the facet  itself.  A mix of autograft,  BMP was used for the arthrodesis.  X-ray was  obtained, but we were able to see only the superior part of the spine.  Having good hemostasis, a Hemovac was left in the epidural space and the  wound was closed with Vicryl and nylon.  The patient was transferred to the  recovery room and he is going to remain in the intensive care unit at least  24 hours.           ______________________________  Hilda Lias, M.D.     EB/MEDQ  D:  06/05/2005  T:  06/06/2005  Job:  [id number]

## 2011-02-02 NOTE — Discharge Summary (Signed)
[name], [name]                          ACCOUNT NO.:  [id number]   MEDICAL RECORD NO.:  [id number]                   PATIENT TYPE:  INP   LOCATION:  3031                                 FACILITY:  [facility]   PHYSICIAN:  Deirdre Peer. Polite, M.D.              DATE OF BIRTH:  05/16/1946   DATE OF ADMISSION:  08/05/2003  DATE OF DISCHARGE:                                 DISCHARGE SUMMARY   DISCHARGE DIAGNOSES:  1. Right lower extremity cellulitis, improved.  2. Intertriginous fungal infection, bilateral feet, improved.  3. Hypotension, probably multifactorial secondary to above infection, +/-     multiple blood pressure medications, resolved at the time of discharge.  4. Severe hypertension.  5. History of prostate carcinoma.  6. History of gout.  7. Tobacco abuse.   DISCHARGE MEDICATIONS:  1. Clindamycin 300 mg one every six hours.  2. Diflucan 100 mg one daily times five days.  The patient is asked to resume all home blood pressure medications:  1. Diltiazem 300 mg daily.  2. Labetalol 300 mg every 12 hours.  3. Clonidine 0.2 mg every night.  4. Celebrex 12 mg daily.  5. Colchicine 0.6 mg daily.  6. Hytrin 10 mg daily.  7. Lisinopril 20 mg daily.   CONSULTANTS:  None.   STUDIES:  1. The patient had a lower extremity ultrasound which was negative for DVT.  2. Blood cultures x2 - negative.  3. Chest x-ray - no acute disease.   LABORATORY DATA:  CBC on admission - white count 20.3.  White count of  August 07, 2003 was 9.7.  BMET within normal limits.   HISTORY OF PRESENT ILLNESS:  A 65 year old male with the above medical  problems presented to the ED complaining of feeling bad, lightheaded and  dizzy.  He also complained of sweats and loss of appetite.  Of note, the  patient has been dealing with a fungal infection in his feet with his  dermatologist without improvement.  On presentation to the ED, the patient  was found to have cellulitis infection in the right lower  extremity and  severe fungal dermatitis of the feet.  The patient was shocky in appearance  with hypotension and leukocytosis on CBC.  Admission was deemed necessary  for further evaluation and treatment.  Please see dictated HPI for further  details of his history of present illness.   PAST MEDICAL HISTORY:  As stated above.  Hypertension, gout, degenerative  joint disease, prostate carcinoma,   MEDICATIONS:  As stated above.   SOCIAL HISTORY:  Significant for 1-1/2 packs of cigarettes a day.  No  alcohol or drugs.   FAMILY HISTORY:  Noncontributory.   HOSPITAL COURSE:  1. The patient was admitted to a medical floor bed for evaluation and     treatment of severe cellulitis, associated hypotension.  The patient was     started on broad-spectrum antibiotics.  Blood pressure medications were     held except for alpha blocker and a low dose beta blocker.  He was given     IV Zosyn, pancultured and was also given systemic antifungal, Diflucan.     The patient did complain of some nausea with emesis during the first 24     hours of hospitalization which resolved with conservative treatment.     From that point on, this hospital course was one of continued     improvement.  Had decreased his leukocytosis to the normal range and     blood pressure normalized and ultimately became hypertensive when     addition of all his medications were needed.  As stated the patient was     pancultured.  Blood cultures were negative as of this date.  His     antibiotics were changed to oral; clindamycin which he tolerated well     without fever, without return of erythema.  At this time he is deemed     stable for discharge.   He will continue clindamycin and Diflucan and will follow up on the  outpatient basis with his primary M.D.   1. History of hypertension.  As stated, initially the patient was     hypotensive on presentation to the ED.  This was probably multifactorial     secondary to current  infection, +/- multiple medications.  The patient's     blood pressure returned to normal after treatment of the above infection.     The patient had several other medical problems which have been outlined     above for which he will continue his current outpatient medication.  At     this time he is deemed medically stable for discharge.                                                Deirdre Peer. Polite, M.D.    RDP/MEDQ  D:  08/09/2003  T:  08/09/2003  Job:  [id number]   cc:   Duncan Dull, M.D.  [address]  [place]  [place] [address]  Fax: [phone]

## 2011-02-02 NOTE — H&P (Signed)
NAMEDARCEY, DEMMA NO.:  [id number]   MEDICAL RECORD NO.:  [id number]          PATIENT TYPE:  INP   LOCATION:  3019                         FACILITY:  [facility]   PHYSICIAN:  [name], M.D.   DATE OF BIRTH:  15-Jan-1946   DATE OF ADMISSION:  06/05/2005  DATE OF DISCHARGE:  06/13/2005                                HISTORY & PHYSICAL   [name] is a gentleman who underwent decompression of the cervical spine  with a cage and a plate by me on May 12, 2005. The patient went home and  he was followed by me in my office. I talked to him by telephone and he came  to see me for a follow-up appointment on June 05, 2005. On the  clinical, he was doing really well. The only concern he had some mild  difficulty swallowing but no pain. I did immediately a lateral C spine as a  part of the follow-up and found that indeed there had been a displacement of  the anterior cage in the lower area up to the point that the vertebra above  cervical 7 was broken. Because of these findings and because I knew that  since it was going to worse, I talked to him and his wife and he was  immediately transferred to [facility] for further intervention.   PAST MEDICAL HISTORY:  1.  Anterior cervical discectomy and corpectomy with a fusion.  2.  Colonoscopy.  3.  TURP.  4.  Appendectomy.  5.  Prostate cancer.   SOCIAL HISTORY:  The patient does not smoke, does not drink.   REVIEW OF SYSTEMS:  Positive for cancer of the prostate, urinary  incontinence, high blood pressure.   PHYSICAL EXAMINATION:  HEENT:  Normal.  NECK:  He has a scar anterior. No flexibility was done.  LUNGS:  Clear.  HEART SOUNDS:  Normal.  ABDOMEN:  Normal.  EXTREMITIES:  Normal pulses.  NEUROLOGIC:  Mental status normal. Cranial nerves normal. He has some mild  weakening of the deltoid. The biceps and triceps are normal. He has normal  coordination. Reflexes are normal.   Examination of the  lateral C spine showed that there was a fracture of the  vertebra above the C7 with displacement of the cage and the graft.   CLINICAL IMPRESSION:  Failure of the anterior cervical decompression.   RECOMMENDATIONS:  The patient is going to be directly admitted to [facility]. We are going to proceed and go ahead and remove the cage as well  as the plate and replace that with a bone graft with a plate, and  then in the second stage we are going to go ahead the same date or a later  date with posterior decompression with lateral __________. The surgery was  fully explained to both of them, including the possibility of damage to the  esophagus, stroke, nerve injury, paralysis, need of further surgery, and  failure of the hardware once again.           ______________________________  [name], M.D.  EB/MEDQ  D:  08/22/2005  T:  08/22/2005  Job:  [id number]

## 2011-02-02 NOTE — H&P (Signed)
NAMEORMAN, MATSUMURA NO.:  [id number]   MEDICAL RECORD NO.:  [id number]          PATIENT TYPE:  INP   LOCATION:                               FACILITY:  [facility]   PHYSICIAN:  Hilda Lias, M.D.   DATE OF BIRTH:  02-18-1946   DATE OF ADMISSION:  06/05/2005  DATE OF DISCHARGE:                                HISTORY & PHYSICAL   [name] is a gentleman who underwent anterior decompression followed by a  fusion on May 11, 2005.  The patient did well on discharge and today he  came for a follow-up appointment at my office.  He had no pain but he had  some difficulty swallowing.  We took a look at a C-spine x-ray and it showed  that there was anterior dislodgment in the inferior aspect of the cage.  Because of that he is being readmitted for reposition of the cage and  posterior fusion including laminectomy from C3-C7.  At the present time the  only complaint is some 3/5 weakness in the deltoid.   PAST MEDICAL HISTORY:  Anterior cervical decompression.  He had  appendectomy, colonoscopy, TURP followed by implant because of cancer of the  prostate.  Patient does not smoke.  He drinks socially.   FAMILY HISTORY:  Unremarkable.   REVIEW OF SYSTEMS:  Positive for high blood pressure, history of cancer/TUR  prostate, urinary incontinence, and __________ of the spine.   PHYSICAL EXAMINATION:  HEENT:  Normal.  NECK:  Has an anterior scar.  We did not do any attempt to mobilize his  neck.  LUNGS:  Clear.  HEART:  Normal.  ABDOMEN:  Normal.  EXTREMITIES:  Normal pulses.  NEUROLOGIC:  Mental status normal except for weakening of the right deltoid.   CLINICAL IMPRESSION:  Dislodgment of the cage in the cervical spine.   RECOMMENDATIONS:  The patient is being admitted for surgery.  We are going  to go ahead and do anterior approach and we are going to reposition the  cage.  After that we are going to position a ____________ and we are going  to do laminectomy from C3  down to C7 bilaterally followed with  posterolateral MAST and screws and arthrodesis with autograft and BMP.  The  risks were explained to both of them including the possibility of infection,  paralysis, no improvement whatsoever, need for further surgery.           ______________________________  Hilda Lias, M.D.     EB/MEDQ  D:  06/05/2005  T:  06/05/2005  Job:  [id number]

## 2011-02-02 NOTE — Discharge Summary (Signed)
NAMEKIERAN, ARREGUIN NO.:  [id number]   MEDICAL RECORD NO.:  [id number]          PATIENT TYPE:  INP   LOCATION:  3023                         FACILITY:  [facility]   PHYSICIAN:  Hilda Lias, M.D.   DATE OF BIRTH:  1945-09-21   DATE OF ADMISSION:  05/11/2005  DATE OF DISCHARGE:  05/15/2005                                 DISCHARGE SUMMARY   ADMITTING DIAGNOSES:  Cervical myelopathy with cervical stenosis C3 down to  C6-C7.   FINAL DIAGNOSES:  Cervical myelopathy with cervical stenosis C3 down to C6-  C7.   CLINICAL HISTORY:  The patient was admitted because of neck and right upper  extremity pain.  This patient a year ago had cervical stenosis at multiple  levels.  He comes now to my office with worsening of pain and wants to  proceed with surgery.  Laboratories normal.   HOSPITAL COURSE:  The patient was taken to surgery and anterior 3-4, 4-5, 5-  6, 6-7 diskectomy was performed.  The patient did really well up to the  point that in the PACU he was moving all four extremities.  Nevertheless,  about two to three hours later he developed some weakness of lower  extremity.  Then he has weakness in the upper extremity.  We kept an eye on  him and eventually he was able to move all the four extremities with some  mild weakness of the left triceps.  We did an MRI and showed the possibility  of hematoma.  Although he got better, nevertheless we were concerned about  why the episode of quadriparesis we took him back to surgery.  We removed  the bone graft and indeed there was some blood in the epidural space.  The  blood was going behind the vertebral bodies.  Because of that we went ahead  and did a 4-5-6 corpectomy and we replaced it with a cage with allograft.  After that the patient was kept in the ICU.  He is doing really well.  The  weakness in the left upper extremity improved.  He had been doing really  well and last night he was moving all four extremities.   Today he has some  weakness in his right deltoid.  The lateral C-spine showed good position of  the graft and the plate.  The patient feels that he wants to go home and  have physical therapy as an outpatient.  We are going to discharge home.  The drain was removed today.   CONDITION ON DISCHARGE:  Improving in relation to the pain, some weakness of  the right deltoid.   MEDICATIONS:  Dilaudid, diazepam, and prednisone.   DIET:  Regular.   ACTIVITY:  He is not to drive for at least a week.   FOLLOW-UP:  I will see him in three or four weeks and I am going to set up  an appointment with [facility].           ______________________________  Hilda Lias, M.D.    EB/MEDQ  D:  05/15/2005  T:  05/15/2005  Job:  [id number]

## 2011-02-02 NOTE — Discharge Summary (Signed)
NAMEFRANCHOT, POLLITT NO.:  [id number]   MEDICAL RECORD NO.:  [id number]          PATIENT TYPE:  INP   LOCATION:  3019                         FACILITY:  [facility]   PHYSICIAN:  Hilda Lias, M.D.   DATE OF BIRTH:  1946-03-10   DATE OF ADMISSION:  06/05/2005  DATE OF DISCHARGE:  06/13/2005                                 DISCHARGE SUMMARY   ADMISSION DIAGNOSIS:  Failure of anterior fusion.   FINAL DIAGNOSIS:  Failure of anterior fusion.   CLINICAL HISTORY:  The patient was re-admitted to the hospital after he came  to see me in the office for postoperative. We found the cage which was  inserted anteriorly, brought the vertebra above the C7, and displaced for a  while. Because of that we decided to bring him to the hospital.   HOSPITAL COURSE:  The patient was taken to surgery. We removed the anterior  cage as well as plate. We proceeded with a bone graft after we removed the  body of the C7. The bone graft was from C3 down to C7 with plate covering  the same area. After the surgery we flipped in the appropriate position and  we did a C3, C4, C5, and C6 laminectomy with posterolateral arthrodesis from  C3 down to the T1 using _________.  After surgery the patient was kept in  the ICU. He complained of quite a bit of pain, but there was no evidence of  any weakness. The patient had been afebrile and except for the pain he had  been well otherwise. He had some problems swallowing. X-rays two days ago  showed good position of the anterior/posterior hardware. Today, he is  feeling much better and he is going to be discharged.   CONDITION ON DISCHARGE:  Improvement.   MEDICATIONS:  Dilaudid, baclofen, and Cipro.   He is going to be careful of eating large pieces of meals. He is gong to  have pureed diet for the next few days.   ACTIVITY:  Not drive. He can drive, he can go out of the house with his  wife.   FOLLOWUP:  He has an appointment to see me in two weeks.  The wife was  present, did not ask questions, and they are going to call me at any time.           ______________________________  Hilda Lias, M.D.     EB/MEDQ  D:  06/13/2005  T:  06/13/2005  Job:  [id number]

## 2011-02-02 NOTE — Op Note (Signed)
NAMEDWIGHT, ADAMCZAK NO.:  [id number]   MEDICAL RECORD NO.:  [id number]          PATIENT TYPE:  INP   LOCATION:  3106                         FACILITY:  [facility]   PHYSICIAN:  Hilda Lias, M.D.   DATE OF BIRTH:  Jan 13, 1946   DATE OF PROCEDURE:  05/11/2005  DATE OF DISCHARGE:                                 OPERATIVE REPORT   PREOPERATIVE DIAGNOSIS:  Status post quadriparesis after diskectomy and  fusion.   POSTOPERATIVE DIAGNOSIS:  Status post quadriparesis after diskectomy and  fusion.   PROCEDURE:  Anterior 4, 5, 6 colpectomy, decompression of the spinal cord,  removal of epidural hematoma, foraminotomy and cage with autograft plate  from C3 to C7, microscopic surgery __________ Dr. Phoebe Perch.   CLINICAL HISTORY:  The patient underwent four level __________ diskectomy  this afternoon.  The patient did well for several hours and then he  developed weakness in both legs.  Later on, he developed weakness in the  left upper extremity.  We kept him on observation and eventually he did well  up to the point that he was able to move all four extremities with some  weakness of the left triceps and left hand. Rectal tone was essentially  negative, and there was no prior __________.  Nevertheless, because we were  concerned of what happened, we did an MRI which showed posterior epidural  hematoma from C3 down to C6-7.  Although clinically, he was back to normal;  nevertheless, we were concerned about the finding of the MRI.  We talked to  him and his wife and we agreed to take him to surgery.   PROCEDURE:  The patient was taken to the operative room, and the neck was  prepped was Betadine.  Removal of the previous stitches was made and we went  straight down to the vertebral area.  With the microscope, we removed the  graft at 3-4, 4-5, 5-6, 6-7.  Indeed, there was some epidural hematoma with  some hematoma going to the vertebral bodies.  Because of that, we went  ahead  and removed the vertebral body of 4, 5, 6.  There was some hematoma in the  posterior ligament.  Removal was achieved.  Decompression of the spinal cord  was done.  Then, the cage which was tailored to 67 mm was inserted to  replace the bone of the vertebral body using his own vertebral body  __________.  Lateral C-spine showed good position of the cage.  A plate from  C3 down to C7, just the first screw, was done.  At the end, we have  decompression.  The area was irrigated. We waited 10 minutes just to be  sure. Then, the area was dried.  Having done this, Jackson-Pratt drain was  left in the prevertebral area and the wound was closed with Vicryl and Steri-  Strips.           ______________________________  Hilda Lias, M.D.     EB/MEDQ  D:  05/12/2005  T:  05/13/2005  Job:  [id number]

## 2011-04-16 ENCOUNTER — Encounter: Payer: Self-pay | Admitting: Internal Medicine

## 2011-04-23 ENCOUNTER — Other Ambulatory Visit: Payer: Self-pay | Admitting: Neurosurgery

## 2011-04-23 ENCOUNTER — Ambulatory Visit
Admission: RE | Admit: 2011-04-23 | Discharge: 2011-04-23 | Disposition: A | Discharge status: Home / Self Care | Payer: 59 | Source: Ambulatory Visit | Attending: Neurosurgery | Admitting: Neurosurgery

## 2011-04-23 DIAGNOSIS — M545 Low back pain, unspecified: Secondary | ICD-10-CM

## 2011-04-25 ENCOUNTER — Telehealth: Payer: Self-pay | Admitting: *Deleted

## 2011-04-25 NOTE — Telephone Encounter (Signed)
Writer noted that pt is taking Plavix when preparing chart for PV.  Spoke with wife, who states that pt has been off of Plavix since March.  He doesn't any prescription blood thinners, just ASA 81mg  po daily.   Plavix removed from pt's medication list

## 2011-04-27 ENCOUNTER — Ambulatory Visit (AMBULATORY_SURGERY_CENTER): Payer: 59 | Admitting: *Deleted

## 2011-04-27 VITALS — Ht 69.0 in | Wt 203.4 lb

## 2011-04-27 DIAGNOSIS — K921 Melena: Secondary | ICD-10-CM

## 2011-04-27 MED ORDER — PEG-KCL-NACL-NASULF-NA ASC-C 100 G PO SOLR: ORAL | Status: DC

## 2011-05-10 ENCOUNTER — Encounter: Payer: Self-pay | Admitting: Internal Medicine

## 2011-05-10 ENCOUNTER — Ambulatory Visit (AMBULATORY_SURGERY_CENTER): Payer: 59 | Admitting: Internal Medicine

## 2011-05-10 VITALS — BP 167/99 | HR 80 | Temp 97.2°F | Resp 22 | Ht 69.0 in | Wt 204.0 lb

## 2011-05-10 DIAGNOSIS — Z1211 Encounter for screening for malignant neoplasm of colon: Secondary | ICD-10-CM

## 2011-05-10 DIAGNOSIS — Z86010 Personal history of colonic polyps: Secondary | ICD-10-CM

## 2011-05-10 DIAGNOSIS — Z800 Family history of malignant neoplasm of digestive organs: Secondary | ICD-10-CM

## 2011-05-10 DIAGNOSIS — D126 Benign neoplasm of colon, unspecified: Secondary | ICD-10-CM

## 2011-05-10 DIAGNOSIS — K921 Melena: Secondary | ICD-10-CM

## 2011-05-10 LAB — GLUCOSE, CAPILLARY: Glucose-Capillary: 107 mg/dL — ABNORMAL HIGH (ref 70–99)

## 2011-05-10 MED ORDER — SODIUM CHLORIDE 0.9 % IV SOLN: 500.0000 mL | INTRAVENOUS | Status: DC

## 2011-05-10 NOTE — Patient Instructions (Signed)
Discharge instructions reviewed with patient and care partner.  Impressions/Recommendations:  Polyp (handout given) Diverticulosis (handout given) High Fiber Diet handout given  Continue medications as you were taking them prior to your procedure.

## 2011-05-11 ENCOUNTER — Telehealth: Payer: Self-pay | Admitting: *Deleted

## 2011-05-11 LAB — GLUCOSE, CAPILLARY: Glucose-Capillary: 107 mg/dL — ABNORMAL HIGH (ref 70–99)

## 2011-05-11 NOTE — Telephone Encounter (Signed)
Follow up Call- Patient questions:  Do you have a fever, pain , or abdominal swelling? no Pain Score  0 *  Have you tolerated food without any problems? yes  Have you been able to return to your normal activities? yes  Do you have any questions about your discharge instructions: Diet   no Medications  no Follow up visit  no  Do you have questions or concerns about your Care? no  Actions: * If pain score is 4 or above: No action needed, pain <4.  Spoke with patient's wife.

## 2011-05-15 ENCOUNTER — Encounter: Payer: Self-pay | Admitting: Internal Medicine

## 2011-05-15 NOTE — Telephone Encounter (Signed)
I am not sure  What this message means. There is no text.

## 2011-06-12 ENCOUNTER — Encounter: Payer: Self-pay | Admitting: Cardiovascular Disease

## 2011-06-13 ENCOUNTER — Ambulatory Visit (INDEPENDENT_AMBULATORY_CARE_PROVIDER_SITE_OTHER): Payer: 59 | Admitting: Cardiovascular Disease

## 2011-06-13 ENCOUNTER — Encounter: Payer: Self-pay | Admitting: Cardiovascular Disease

## 2011-06-13 DIAGNOSIS — I10 Essential (primary) hypertension: Secondary | ICD-10-CM

## 2011-06-13 DIAGNOSIS — F17200 Nicotine dependence, unspecified, uncomplicated: Secondary | ICD-10-CM | POA: Insufficient documentation

## 2011-06-13 DIAGNOSIS — I6529 Occlusion and stenosis of unspecified carotid artery: Secondary | ICD-10-CM

## 2011-06-13 DIAGNOSIS — E663 Overweight: Secondary | ICD-10-CM

## 2011-06-13 DIAGNOSIS — E785 Hyperlipidemia, unspecified: Secondary | ICD-10-CM

## 2011-06-13 DIAGNOSIS — E119 Type 2 diabetes mellitus without complications: Secondary | ICD-10-CM

## 2011-06-13 DIAGNOSIS — I739 Peripheral vascular disease, unspecified: Secondary | ICD-10-CM

## 2011-06-13 LAB — BASIC METABOLIC PANEL
CO2: 28
Calcium: 9.2
Chloride: 107
Creatinine, Ser: 1.03
Creatinine, Ser: 1.57 — ABNORMAL HIGH
GFR calc Af Amer: 54 — ABNORMAL LOW
GFR calc Af Amer: >60
GFR calc non Af Amer: 45 — ABNORMAL LOW
Potassium: 4.9
Sodium: 140

## 2011-06-13 LAB — CBC
HCT: 34.1 — ABNORMAL LOW
Hemoglobin: 12.1 — ABNORMAL LOW
MCHC: 35.3
MCV: 90.2
MCV: 90.3
Platelets: 141 — ABNORMAL LOW
RBC: 3.79 — ABNORMAL LOW
RDW: 14.4
WBC: 10.9 — ABNORMAL HIGH
WBC: 15.4 — ABNORMAL HIGH

## 2011-06-13 LAB — DIFFERENTIAL
Basophils Absolute: 0
Basophils Relative: 0
Eosinophils Absolute: 0
Eosinophils Absolute: 0.1
Eosinophils Relative: 1
Lymphocytes Relative: 8 — ABNORMAL LOW
Lymphs Abs: 1.3
Lymphs Abs: 2.2
Monocytes Absolute: 0.8
Monocytes Relative: 8
Neutrophils Relative %: 71
Neutrophils Relative %: 81 — ABNORMAL HIGH

## 2011-06-13 LAB — CULTURE, BLOOD (ROUTINE X 2)

## 2011-06-13 MED ORDER — VARENICLINE TARTRATE 1 MG PO TABS: 1.0000 mg | ORAL_TABLET | Freq: Two times a day (BID) | ORAL | Status: AC

## 2011-06-13 NOTE — Assessment & Plan Note (Signed)
We'll schedule him for a carotid ultrasound as this has not been done in one year. We have talked to him about smoking and the risk of progression of his carotid arterial disease. We have strongly recommended smoking cessation.

## 2011-06-13 NOTE — Assessment & Plan Note (Signed)
We have encouraged continued exercise, careful diet management in an effort to lose weight. 

## 2011-06-13 NOTE — Assessment & Plan Note (Signed)
Initial blood pressure was elevated, repeat blood pressure was much improved. No medication changes made.

## 2011-06-13 NOTE — Progress Notes (Signed)
Patient ID: [name], male    DOB: [birth year], 65 y.o.   MRN: [id number]  HPI Comments: 65 year old male with a history of obstructive sleep apnea, peripheral vascular disease with severe right internal carotid arterial stenosis estimated at 70 to 80%, diabetes, hypertension, hyperlipidemia, obesity, history of cervical spine surgery and prostate cancer with treatment who presents for routine followup. He has a long history of smoking, stopped for 3 years recently and is now smoking again over one pack per day. His wife also smokes.  He reports that he's been doing well. He reports that his "lab work numbers are all good" per Dr. Margo Aye. He is smoking one pack per day. He has had significant stress and discomfort from his back and that is why he is smoking. He reports that his ruptured 2 discs. He missed his appointment to have his carotid ultrasound performed for his carotid stenosis. He does not exercise, weight has been high. Occasional redness and swelling in the right leg that he believes could be from cellulitis otherwise no significant swelling on the left.  Otherwise he feels well with no significant symptoms of shortness of breath or chest discomfort.   EKG shows normal sinus rhythm with rate of 80 beats per minute, no significant ST or T wave changes.   Outpatient Encounter Prescriptions as of 06/13/2011  Medication Sig Dispense Refill  . acetaminophen (TYLENOL) 500 MG tablet Take 500 mg by mouth every 6 (six) hours as needed.        Marland Kitchen allopurinol (ZYLOPRIM) 300 MG tablet Take 300 mg by mouth daily.        Marland Kitchen amLODipine (NORVASC) 5 MG tablet Take 1 tablet (5 mg total) by mouth daily.  30 tablet  6  . aspirin 81 MG EC tablet Take 162 mg by mouth daily.       . colchicine 0.6 MG tablet Take 0.6 mg by mouth daily as needed.        . diphenhydramine-acetaminophen (TYLENOL PM) 25-500 MG TABS Take 1 tablet by mouth at bedtime as needed.        . furosemide (LASIX) 40 MG tablet Take 40 mg  by mouth daily. 1 tablet po prn for SOB or swelling       . gabapentin (NEURONTIN) 300 MG capsule Take 300 mg by mouth 3 (three) times daily.        Marland Kitchen glimepiride (AMARYL) 2 MG tablet Take 2 mg by mouth 2 (two) times daily.        Marland Kitchen HYDROcodone-acetaminophen (NORCO) 5-325 MG per tablet Take 1 tablet by mouth every 6 (six) hours as needed.        . insulin detemir (LEVEMIR) 100 UNIT/ML injection Inject 100 Units into the skin at bedtime.        Marland Kitchen labetalol (NORMODYNE) 300 MG tablet Take 300 mg by mouth 2 (two) times daily.        Marland Kitchen losartan (COZAAR) 100 MG tablet Take 1 tablet (100 mg total) by mouth daily.  30 tablet  6  . Misc Natural Products (COLON CLEANSER) CAPS Take 1 capsule by mouth daily.        . niacin (NIASPAN) 1000 MG CR tablet Take 1,000 mg by mouth at bedtime.        Marland Kitchen omega-3 acid ethyl esters (LOVAZA) 1 G capsule Take 4 g by mouth 2 (two) times daily.        . potassium chloride (KLOR-CON) 20 MEQ packet Take 20 mEq by  mouth daily.        . rosuvastatin (CRESTOR) 5 MG tablet Take 5 mg by mouth daily.        . sitaGLIPtan (JANUVIA) 100 MG tablet Take 100 mg by mouth daily.        . varenicline (CHANTIX CONTINUING MONTH PAK) 1 MG tablet Take 1 tablet (1 mg total) by mouth 2 (two) times daily.  60 tablet  3    Review of Systems  Constitutional: Negative.   HENT: Negative.   Eyes: Negative.   Respiratory: Negative.   Cardiovascular: Positive for leg swelling.  Gastrointestinal: Negative.   Musculoskeletal: Positive for back pain.  Skin: Negative.   Neurological: Negative.   Hematological: Negative.   Psychiatric/Behavioral: Negative.   All other systems reviewed and are negative.   BP 125/76  Pulse 79  Ht 5\' 9"  (1.753 m)  Wt 205 lb (92.987 kg)  BMI 30.27 kg/m2   Physical Exam  Nursing note and vitals reviewed. Constitutional: He is oriented to person, place, and time. He appears well-developed and well-nourished.  HENT:  Head: Normocephalic.  Right Ear: External  ear normal.  Left Ear: External ear normal.  Nose: Nose normal.  Eyes: Right eye exhibits no discharge. No scleral icterus.  Neck: Normal range of motion. Neck supple. No JVD present.  Cardiovascular: Normal rate, regular rhythm, S1 normal, S2 normal, normal heart sounds and intact distal pulses.  Exam reveals no gallop and no friction rub.   No murmur heard. Pulmonary/Chest: Effort normal and breath sounds normal. No respiratory distress. He has no wheezes. He has no rales. He exhibits no tenderness.  Abdominal: Soft. Bowel sounds are normal. He exhibits no distension. There is no tenderness.  Musculoskeletal: Normal range of motion. He exhibits no edema and no tenderness.  Lymphadenopathy:    He has no cervical adenopathy.  Neurological: He is alert and oriented to person, place, and time. Coordination normal.  Skin: Skin is warm and dry. No rash noted. No erythema.  Psychiatric: He has a normal mood and affect. His behavior is normal. Judgment and thought content normal.           Assessment and Plan

## 2011-06-13 NOTE — Assessment & Plan Note (Signed)
Goal total cholesterol less than 150, LDL less than 70. We'll try to obtain the labs from Dr. Margo Aye.

## 2011-06-13 NOTE — Assessment & Plan Note (Signed)
He was counseled on smoking cessation. We will call in chantix.

## 2011-06-13 NOTE — Assessment & Plan Note (Signed)
I recommended that he closely monitor his diet and decrease his foods that are high in calories and carbohydrate.

## 2011-06-13 NOTE — Patient Instructions (Addendum)
You are doing well. Please increase the crestor to 10 mg while you are smoking We have called in chantix to your pharmacy Please call us if you have new issues that need to be addressed before your next appt.  We will call you for a follow up Appt. In 6 months  Your physician has requested that you have a carotid duplex. This test is an ultrasound of the carotid arteries in your neck. It looks at blood flow through these arteries that supply the brain with blood. Allow one hour for this exam. There are no restrictions or special instructions.

## 2011-06-14 ENCOUNTER — Encounter: Payer: Self-pay | Admitting: Internal Medicine

## 2011-06-26 ENCOUNTER — Encounter (INDEPENDENT_AMBULATORY_CARE_PROVIDER_SITE_OTHER): Payer: 59 | Admitting: *Deleted

## 2011-06-26 DIAGNOSIS — I6529 Occlusion and stenosis of unspecified carotid artery: Secondary | ICD-10-CM

## 2011-06-30 ENCOUNTER — Encounter: Payer: Self-pay | Admitting: Cardiovascular Disease

## 2011-07-02 ENCOUNTER — Telehealth: Payer: Self-pay | Admitting: *Deleted

## 2011-07-02 DIAGNOSIS — I6529 Occlusion and stenosis of unspecified carotid artery: Secondary | ICD-10-CM

## 2011-07-02 NOTE — Telephone Encounter (Signed)
6 mo carotid orders placed.

## 2011-07-02 NOTE — Telephone Encounter (Signed)
Message copied by Annia Belt on Mon Jul 02, 2011  8:58 AM ------      Message from: Oneida Arenas      Created: Sun Jul 01, 2011  7:54 PM       Recalled entered for 6 month carotid.

## 2011-09-04 ENCOUNTER — Telehealth: Payer: Self-pay

## 2011-09-04 MED ORDER — POTASSIUM CHLORIDE 20 MEQ PO PACK: 20.0000 meq | PACK | Freq: Every day | ORAL | Status: DC

## 2011-09-04 MED ORDER — AMLODIPINE BESYLATE 5 MG PO TABS: 5.0000 mg | ORAL_TABLET | Freq: Every day | ORAL | Status: DC

## 2011-09-04 MED ORDER — LOSARTAN POTASSIUM 100 MG PO TABS: 100.0000 mg | ORAL_TABLET | Freq: Every day | ORAL | Status: DC

## 2011-09-04 NOTE — Telephone Encounter (Signed)
Refill sent for losartan,norvasc and amlodipine.

## 2011-09-17 ENCOUNTER — Telehealth: Payer: Self-pay | Admitting: *Deleted

## 2011-09-17 MED ORDER — POTASSIUM CHLORIDE ER 10 MEQ PO TBCR: 20.0000 meq | EXTENDED_RELEASE_TABLET | Freq: Every day | ORAL | Status: DC

## 2011-09-17 NOTE — Telephone Encounter (Signed)
Potassium chloride packet was called in and pt's pharmacy states pt will not buy the powder. The powder is typically much more expensive. Dr. Mariah Milling, did you want the packet ordered for any particular reason? Probably was just sent by mistake, I will send in tablets, just wanted to make sure no other reason packet is needed. Thanks.

## 2011-09-18 HISTORY — PX: CATARACT EXTRACTION W/ INTRAOCULAR LENS  IMPLANT, BILATERAL: SHX1307

## 2011-12-04 ENCOUNTER — Emergency Department (HOSPITAL_COMMUNITY): Payer: Medicare Other

## 2011-12-04 ENCOUNTER — Inpatient Hospital Stay (HOSPITAL_COMMUNITY)
Admission: EM | Admit: 2011-12-04 | Discharge: 2011-12-06 | DRG: 073 | Disposition: A | Discharge status: Home / Self Care | Payer: Medicare Other | Attending: Critical Care Medicine | Admitting: Critical Care Medicine

## 2011-12-04 ENCOUNTER — Encounter (HOSPITAL_COMMUNITY): Payer: Self-pay | Admitting: *Deleted

## 2011-12-04 ENCOUNTER — Other Ambulatory Visit: Payer: Self-pay

## 2011-12-04 DIAGNOSIS — M1A9XX0 Chronic gout, unspecified, without tophus (tophi): Secondary | ICD-10-CM

## 2011-12-04 DIAGNOSIS — E785 Hyperlipidemia, unspecified: Secondary | ICD-10-CM

## 2011-12-04 DIAGNOSIS — Z8719 Personal history of other diseases of the digestive system: Secondary | ICD-10-CM

## 2011-12-04 DIAGNOSIS — F17200 Nicotine dependence, unspecified, uncomplicated: Secondary | ICD-10-CM

## 2011-12-04 DIAGNOSIS — E119 Type 2 diabetes mellitus without complications: Secondary | ICD-10-CM

## 2011-12-04 DIAGNOSIS — C679 Malignant neoplasm of bladder, unspecified: Secondary | ICD-10-CM

## 2011-12-04 DIAGNOSIS — R4182 Altered mental status, unspecified: Secondary | ICD-10-CM

## 2011-12-04 DIAGNOSIS — M1710 Unilateral primary osteoarthritis, unspecified knee: Secondary | ICD-10-CM

## 2011-12-04 DIAGNOSIS — I7771 Dissection of carotid artery: Secondary | ICD-10-CM

## 2011-12-04 DIAGNOSIS — Z7982 Long term (current) use of aspirin: Secondary | ICD-10-CM

## 2011-12-04 DIAGNOSIS — G9340 Encephalopathy, unspecified: Secondary | ICD-10-CM | POA: Diagnosis present

## 2011-12-04 DIAGNOSIS — M25569 Pain in unspecified knee: Secondary | ICD-10-CM

## 2011-12-04 DIAGNOSIS — M1A00X0 Idiopathic chronic gout, unspecified site, without tophus (tophi): Secondary | ICD-10-CM

## 2011-12-04 DIAGNOSIS — E663 Overweight: Secondary | ICD-10-CM

## 2011-12-04 DIAGNOSIS — M549 Dorsalgia, unspecified: Secondary | ICD-10-CM

## 2011-12-04 DIAGNOSIS — K5909 Other constipation: Secondary | ICD-10-CM

## 2011-12-04 DIAGNOSIS — Z833 Family history of diabetes mellitus: Secondary | ICD-10-CM

## 2011-12-04 DIAGNOSIS — I10 Essential (primary) hypertension: Secondary | ICD-10-CM

## 2011-12-04 DIAGNOSIS — I739 Peripheral vascular disease, unspecified: Secondary | ICD-10-CM

## 2011-12-04 DIAGNOSIS — Z87891 Personal history of nicotine dependence: Secondary | ICD-10-CM

## 2011-12-04 DIAGNOSIS — G4730 Sleep apnea, unspecified: Secondary | ICD-10-CM

## 2011-12-04 DIAGNOSIS — M1990 Unspecified osteoarthritis, unspecified site: Secondary | ICD-10-CM

## 2011-12-04 DIAGNOSIS — I6529 Occlusion and stenosis of unspecified carotid artery: Secondary | ICD-10-CM

## 2011-12-04 DIAGNOSIS — R509 Fever, unspecified: Secondary | ICD-10-CM

## 2011-12-04 DIAGNOSIS — Z8551 Personal history of malignant neoplasm of bladder: Secondary | ICD-10-CM

## 2011-12-04 DIAGNOSIS — G4733 Obstructive sleep apnea (adult) (pediatric): Secondary | ICD-10-CM | POA: Diagnosis present

## 2011-12-04 DIAGNOSIS — M25469 Effusion, unspecified knee: Secondary | ICD-10-CM

## 2011-12-04 DIAGNOSIS — Z8249 Family history of ischemic heart disease and other diseases of the circulatory system: Secondary | ICD-10-CM

## 2011-12-04 DIAGNOSIS — I771 Stricture of artery: Secondary | ICD-10-CM

## 2011-12-04 DIAGNOSIS — C61 Malignant neoplasm of prostate: Secondary | ICD-10-CM

## 2011-12-04 DIAGNOSIS — Z8546 Personal history of malignant neoplasm of prostate: Secondary | ICD-10-CM

## 2011-12-04 DIAGNOSIS — IMO0002 Reserved for concepts with insufficient information to code with codable children: Principal | ICD-10-CM | POA: Diagnosis present

## 2011-12-04 DIAGNOSIS — IMO0001 Reserved for inherently not codable concepts without codable children: Secondary | ICD-10-CM

## 2011-12-04 LAB — URINE MICROSCOPIC-ADD ON

## 2011-12-04 LAB — CBC
MCH: 31.7 pg (ref 26.0–34.0)
MCHC: 34.4 g/dL (ref 30.0–36.0)
MCV: 92 fL (ref 78.0–100.0)
Platelets: 156 10*3/uL (ref 150–400)
RDW: 13.2 % (ref 11.5–15.5)
WBC: 10.4 10*3/uL (ref 4.0–10.5)

## 2011-12-04 LAB — TROPONIN I: Troponin I: <0.3 ng/mL (ref <0.30)

## 2011-12-04 LAB — URINALYSIS, ROUTINE W REFLEX MICROSCOPIC
Ketones, ur: NEGATIVE mg/dL
Leukocytes, UA: NEGATIVE
Protein, ur: NEGATIVE mg/dL
Urobilinogen, UA: 0.2 mg/dL (ref 0.0–1.0)

## 2011-12-04 LAB — DIFFERENTIAL
Basophils Absolute: 0 10*3/uL (ref 0.0–0.1)
Eosinophils Absolute: 0.4 10*3/uL (ref 0.0–0.7)
Eosinophils Relative: 3 % (ref 0–5)

## 2011-12-04 LAB — HEPATIC FUNCTION PANEL
AST: 27 U/L (ref 0–37)
Alkaline Phosphatase: 79 U/L (ref 39–117)
Bilirubin, Direct: <0.1 mg/dL (ref 0.0–0.3)
Total Bilirubin: 0.3 mg/dL (ref 0.3–1.2)

## 2011-12-04 LAB — BASIC METABOLIC PANEL
Calcium: 10.2 mg/dL (ref 8.4–10.5)
GFR calc non Af Amer: 62 mL/min — ABNORMAL LOW (ref >90)
Sodium: 140 mEq/L (ref 135–145)

## 2011-12-04 LAB — GLUCOSE, CAPILLARY: Glucose-Capillary: 177 mg/dL — ABNORMAL HIGH (ref 70–99)

## 2011-12-04 LAB — LACTIC ACID, PLASMA: Lactic Acid, Venous: 1.5 mmol/L (ref 0.5–2.2)

## 2011-12-04 MED ORDER — DIAZEPAM 5 MG PO TABS
ORAL_TABLET | ORAL | Status: AC
  Filled 2011-12-04: qty 1

## 2011-12-04 MED ORDER — SODIUM CHLORIDE 0.9 % IV BOLUS (SEPSIS)
1000.0000 mL | Freq: Once | INTRAVENOUS | Status: AC
  Administered 2011-12-04: 1000 mL via INTRAVENOUS

## 2011-12-04 MED ORDER — HEPARIN (PORCINE) IN NACL 100-0.45 UNIT/ML-% IJ SOLN
16.0000 [IU]/kg/h | Freq: Once | INTRAMUSCULAR | Status: AC
  Administered 2011-12-04: 16 [IU]/kg/h via INTRAVENOUS
  Filled 2011-12-04: qty 250

## 2011-12-04 MED ORDER — IOHEXOL 350 MG/ML SOLN
100.0000 mL | Freq: Once | INTRAVENOUS | Status: AC | PRN
Start: 2011-12-04 — End: 2011-12-04
  Administered 2011-12-04: 100 mL via INTRAVENOUS

## 2011-12-04 MED ORDER — PREDNISONE 50 MG PO TABS: 50.0000 mg | ORAL_TABLET | Freq: Every day | ORAL | Status: DC

## 2011-12-04 MED ORDER — DEXTROSE 5 % IV SOLN
1.0000 g | Freq: Once | INTRAVENOUS | Status: AC
  Administered 2011-12-04: 1 g via INTRAVENOUS
  Filled 2011-12-04: qty 10

## 2011-12-04 MED ORDER — HYDROMORPHONE HCL PF 1 MG/ML IJ SOLN
1.0000 mg | Freq: Once | INTRAMUSCULAR | Status: AC
  Administered 2011-12-04: 1 mg via INTRAVENOUS
  Filled 2011-12-04: qty 1

## 2011-12-04 MED ORDER — LORAZEPAM 2 MG/ML IJ SOLN
1.0000 mg | Freq: Once | INTRAMUSCULAR | Status: AC
  Administered 2011-12-04: 1 mg via INTRAVENOUS
  Filled 2011-12-04: qty 1

## 2011-12-04 MED ORDER — ACETAMINOPHEN 500 MG PO TABS
1000.0000 mg | ORAL_TABLET | Freq: Once | ORAL | Status: AC
  Administered 2011-12-04: 1000 mg via ORAL
  Filled 2011-12-04: qty 2

## 2011-12-04 MED ORDER — METOPROLOL TARTRATE 1 MG/ML IV SOLN
5.0000 mg | Freq: Once | INTRAVENOUS | Status: AC
  Administered 2011-12-04: 5 mg via INTRAVENOUS
  Filled 2011-12-04: qty 5

## 2011-12-04 MED ORDER — IBUPROFEN 800 MG PO TABS
800.0000 mg | ORAL_TABLET | Freq: Once | ORAL | Status: AC
  Administered 2011-12-04: 800 mg via ORAL
  Filled 2011-12-04: qty 1

## 2011-12-04 MED ORDER — NICARDIPINE HCL IN NACL 20-0.86 MG/200ML-% IV SOLN
5.0000 mg/h | INTRAVENOUS | Status: DC
  Filled 2011-12-04: qty 200

## 2011-12-04 MED ORDER — DIAZEPAM 5 MG PO TABS
5.0000 mg | ORAL_TABLET | Freq: Once | ORAL | Status: AC
  Administered 2011-12-04: 5 mg via ORAL

## 2011-12-04 MED ORDER — DEXAMETHASONE SODIUM PHOSPHATE 4 MG/ML IJ SOLN
10.0000 mg | Freq: Once | INTRAMUSCULAR | Status: AC
  Administered 2011-12-04: 10 mg via INTRAVENOUS
  Filled 2011-12-04: qty 3

## 2011-12-04 MED ORDER — VANCOMYCIN HCL IN DEXTROSE 1-5 GM/200ML-% IV SOLN
1000.0000 mg | Freq: Once | INTRAVENOUS | Status: AC
  Administered 2011-12-04: 1000 mg via INTRAVENOUS
  Filled 2011-12-04: qty 200

## 2011-12-04 MED ORDER — OXYCODONE-ACETAMINOPHEN 5-325 MG PO TABS: 2.0000 | ORAL_TABLET | ORAL | Status: DC | PRN

## 2011-12-04 MED ORDER — HEPARIN BOLUS VIA INFUSION
4000.0000 [IU] | Freq: Once | INTRAVENOUS | Status: AC
  Administered 2011-12-04: 4000 [IU] via INTRAVENOUS

## 2011-12-04 MED ORDER — LORAZEPAM 2 MG/ML IJ SOLN
1.0000 mg | Freq: Once | INTRAMUSCULAR | Status: DC
Start: 2011-12-04 — End: 2011-12-04

## 2011-12-04 MED ORDER — ONDANSETRON HCL 4 MG/2ML IJ SOLN
4.0000 mg | Freq: Once | INTRAMUSCULAR | Status: AC
  Administered 2011-12-04: 4 mg via INTRAVENOUS
  Filled 2011-12-04: qty 2

## 2011-12-04 NOTE — ED Notes (Addendum)
Shaking, all over, face flushed.  Back and leg pain  Assisted from car to w/c.

## 2011-12-04 NOTE — ED Notes (Signed)
Found pt sitting up on the side of the bed. Stating he needed to used the bathroom. Assisted pt w/ urinal & pt moved back up in bed. TV turned on & call light given to pt w/ instruction to call for assistance.

## 2011-12-04 NOTE — ED Provider Notes (Signed)
History     CSN: [id number]  Arrival date & time 12/04/11  1928   First MD Initiated Contact with Patient 12/04/11 1931      Chief Complaint  Patient presents with  . Shaking    (Consider location/radiation/quality/duration/timing/severity/associated sxs/prior treatment) HPI Comments: Patient rest of severe lower back pain it radiates in her bilateral legs. He is anxious, tachycardic, flushed and unable to give a history. Symptoms started suddenly while eating dinner. He denies any weakness, numbness, tingling in his legs no bowel and bladder incontinence. No chest pain, shortness of breath. No upper back pain. He has a history of prostate cancer, sleep apnea, peripheral vascular disease, DM, stenosis.  He has had Back pain the past but this is much worse.  MRI June 2012 IMPRESSION:   1.  Progressive degenerative disease of the lumbar spine superimposed on congenitally short pedicles. 2.  New bilateral L2-L3 foraminal and extraforaminal protrusions contacting both exiting L2 nerve roots in the lateral aspect of the foramina, greater on the left than right.  Mild resulting foraminal stenosis.   3.  Unchanged shallow left eccentric broad-based L3-L4 disc bulge with right greater than left lateral recess stenosis. 4.  L4-L5 progressive anterolisthesis, still grade 1, with mild left lateral recess and mild bilateral foraminal stenosis. 5.  No change at L5-S1.   The history is provided by the patient. The history is limited by the condition of the patient.    Past Medical History  Diagnosis Date  . Hyperlipidemia   . Sleep apnea   . Personal history of malignant neoplasm of prostate   . Malignant neoplasm of bladder, part unspecified   . Peripheral vascular disease, unspecified   . Personal history of other diseases of digestive system   . Osteoarthrosis, unspecified whether generalized or localized, unspecified site   . Other constipation   . Diabetes mellitus   .  Hypertension   . Gouty arthritis   . Prolapsed cervical disc   . Carotid stenosis, right     severe, asymptomatic  . OSA on CPAP   . Obesity   . Cellulitis Apr 25, 2011    right leg    Past Surgical History  Procedure Date  . Cervical fusion   . Appendectomy   . Brain surgery 2011  . Septoplasty   . Cataract extraction, bilateral   . Colonoscopy 04/2011    Family History  Problem Relation Age of Onset  . Cancer Other     Family hx  . Diabetes Other     family hx  . Arthritis Other     family hx  . Cancer Mother   . Heart disease Father     MI  . Colon cancer Brother     History  Substance Use Topics  . Smoking status: Former Smoker -- 1.0 packs/day    Types: Cigarettes  . Smokeless tobacco: Never Used  . Alcohol Use: No      Review of Systems  Unable to perform ROS: Other    Allergies  Pregabalin  Home Medications   Current Outpatient Rx  Name Route Sig Dispense Refill  . ALLOPURINOL 300 MG PO TABS Oral Take 300 mg by mouth at bedtime.     Marland Kitchen AMLODIPINE BESYLATE 5 MG PO TABS Oral Take 1 tablet (5 mg total) by mouth daily. 30 tablet 6  . ASPIRIN 81 MG PO TBEC Oral Take 162 mg by mouth at bedtime.     Marland Kitchen VITAMIN D 1000 UNITS PO  TABS Oral Take 1,000 Units by mouth daily.    Marland Kitchen DICLOFENAC SODIUM 75 MG PO TBEC Oral Take 75 mg by mouth 2 (two) times daily. For 1 to 2 weeks routinely    . DOCUSATE SODIUM 100 MG PO CAPS Oral Take 200 mg by mouth at bedtime.    . FUROSEMIDE 40 MG PO TABS Oral Take 40 mg by mouth daily. 1 tablet po prn for SOB or swelling     . GABAPENTIN 300 MG PO CAPS Oral Take 300 mg by mouth 3 (three) times daily.      Marland Kitchen GLIMEPIRIDE 2 MG PO TABS Oral Take 2 mg by mouth 2 (two) times daily.      . INSULIN DETEMIR 100 UNIT/ML [place] SOLN Subcutaneous Inject 8 Units into the skin at bedtime.     Marland Kitchen LABETALOL HCL 300 MG PO TABS Oral Take 300 mg by mouth 2 (two) times daily.      Marland Kitchen LOSARTAN POTASSIUM 100 MG PO TABS Oral Take 1 tablet (100 mg total) by  mouth daily. 30 tablet 6  . NIACIN ER (ANTIHYPERLIPIDEMIC) 1000 MG PO TBCR Oral Take 2,000 mg by mouth at bedtime.     . OMEGA-3-ACID ETHYL ESTERS 1 G PO CAPS Oral Take 1 g by mouth 2 (two) times daily.     Marland Kitchen POTASSIUM GLUCONATE 595 MG PO CAPS Oral Take 1 capsule by mouth daily.    Marland Kitchen ROSUVASTATIN CALCIUM 5 MG PO TABS Oral Take 5 mg by mouth daily.      Marland Kitchen SITAGLIPTIN PHOSPHATE 100 MG PO TABS Oral Take 100 mg by mouth daily.      Marland Kitchen TAMSULOSIN HCL 0.4 MG PO CAPS Oral Take 0.4 mg by mouth at bedtime.    . ACETAMINOPHEN 500 MG PO TABS Oral Take 500 mg by mouth every 6 (six) hours as needed.      . COLCHICINE 0.6 MG PO TABS Oral Take 0.6 mg by mouth daily as needed.      Marland Kitchen HYDROCODONE-ACETAMINOPHEN 5-325 MG PO TABS Oral Take 1 tablet by mouth every 6 (six) hours as needed. For pain      BP 189/91  Pulse 121  Temp(Src) 103.9 F (39.9 C) (Rectal)  Resp 22  SpO2 97%  Physical Exam  Constitutional: He is oriented to person, place, and time. He appears well-developed and well-nourished. He appears distressed.  HENT:  Head: Normocephalic and atraumatic.  Mouth/Throat: Oropharynx is clear and moist. No oropharyngeal exudate.  Eyes: Conjunctivae are normal. Pupils are equal, round, and reactive to light.  Neck: Normal range of motion. Neck supple.  Cardiovascular: Normal rate, regular rhythm and normal heart sounds.   No murmur heard. Pulmonary/Chest: Effort normal and breath sounds normal. No respiratory distress.  Abdominal: Soft. He exhibits distension. There is no tenderness. There is no rebound and no guarding.  Musculoskeletal: He exhibits tenderness.       Lumbar spine tenderness without stepoff or deformity  Neurological: He is alert and oriented to person, place, and time. No cranial nerve deficit.       Ankle dorsiflexion and plantar flexion intact bilaterally, +2 DP and PT pulses.  Great toe dorsiflexion intact bilaterally.   Skin: Skin is warm.    ED Course  Procedures (including  critical care time)  Labs Reviewed  DIFFERENTIAL - Abnormal; Notable for the following:    Lymphocytes Relative 11 (*)    Monocytes Absolute 1.2 (*)    All other components within normal limits  BASIC  METABOLIC PANEL - Abnormal; Notable for the following:    Glucose, Bld 120 (*)    GFR calc non Af Amer 62 (*)    GFR calc Af Amer 72 (*)    All other components within normal limits  CBC  TROPONIN I  LIPASE, BLOOD  HEPATIC FUNCTION PANEL  URINALYSIS, ROUTINE W REFLEX MICROSCOPIC  CULTURE, BLOOD (ROUTINE X 2)  CULTURE, BLOOD (ROUTINE X 2)  LACTIC ACID, PLASMA  CSF CELL COUNT WITH DIFFERENTIAL  CSF CULTURE  PROTEIN AND GLUCOSE, CSF   Ct Abdomen Pelvis Wo Contrast  12/04/2011  *RADIOLOGY REPORT*  Clinical Data: Severe back and leg pain  CT ABDOMEN AND PELVIS WITHOUT CONTRAST  Technique:  Multidetector CT imaging of the abdomen and pelvis was performed following the standard protocol without intravenous contrast.  Comparison: 11/13/2007  Findings: The lung bases are clear.  No pericardial or pleural effusion identified.  Within the limitations of noncontrast technique the liver parenchyma appears intact.  Gallbladder is negative.  There is no biliary dilatation.  The pancreas appears normal.  Normal appearance of both adrenal glands.  The spleen appears normal.  Mild, symmetric perinephric fat stranding is identified and appears unchanged from previous exam.  Low density structures within both kidneys are again noted, compatible with cysts.  These are incompletely characterized on today's exam due to lack of IV contrast.  No upper abdominal adenopathy identified.  There is no pelvic or inguinal adenopathy.  Seed implants identified within the prostate gland.  The urinary bladder appears normal.  The stomach and the small bowel loops are normal.  The proximal colon is normal.  There are multiple diverticula arising from the left colon and sigmoid colon without evidence for acute inflammation.  No  free fluid or abnormal fluid collections within the abdomen or pelvis.  Normal caliber of the abdominal aorta.  No aneurysm.  Review of the visualized osseous structures shows evidence of multilevel lumbar degenerative disc disease.  There is a first- degree anterolisthesis of L4 on L5.  IMPRESSION:  1.  No acute findings identified within the abdomen or pelvis.  Original Report Authenticated By: Rosealee Albee, M.D.   Ct Angio Head W/cm &/or Wo Cm  12/04/2011  *RADIOLOGY REPORT*  Clinical Data:  66 year old male with abnormal shaking.  Pain. Flushing.  History of prostate cancer.  Prior cervical spine and brain surgery not otherwise specified.  CT ANGIOGRAPHY HEAD AND NECK  Technique:  Multidetector CT imaging of the head and neck was performed using the standard protocol during bolus administration of intravenous contrast.  Multiplanar CT image reconstructions including MIPs were obtained to evaluate the vascular anatomy. Carotid stenosis measurements (when applicable) are obtained utilizing NASCET criteria, using the distal internal carotid diameter as the denominator.  Contrast: OMNIPAQUE IOHEXOL 350 MG/ML IV SOLN 100 ml Omnipaque 350.  Comparison:  Cervical MRI 05/11/2005.  Head CT without contrast 08/05/2003.  CTA NECK  Findings:  Negative visualized lung apices.  No superior mediastinal lymphadenopathy.  Negative thyroid, larynx, pharynx, parapharyngeal spaces, retropharyngeal space, sublingual space, submandibular and parotid glands.  Postoperative changes to the globes.  Previous paranasal sinus surgery with residual widespread mucosal thickening.  Mastoids are clear.  Previous anterior and posterior cervical fusion from C3 to T1 including multilevel cervical corpectomy.  Subsequent fusion hardware streak artifact at multiple levels.  No suspicious osseous lesion is identified.  Vascular Findings: Mild arch atherosclerosis.  Great vessel origins are within normal limits.  Suboptimal intraarterial  contrast bolus timing  in the neck.  Normal right common carotid artery to the level of the bifurcation. .  Soft and calcified plaque occurs at the right carotid bifurcation and involves the right ICA origin and bulb.  Focal intimal flap (series 8 image 77) through the right ICA origin. High-grade right ICA stenosis just beyond this level (image 79). The cervical right ICA remains patent to the skull base.  Right vertebral artery origin largely obscured by hardware and body habitus streak artifact.  The right vertebral artery is patent to the skull base.  Normal left common carotid artery to the carotid bifurcation. Calcified atherosclerosis occurs at the bifurcation but results in less than 50 % stenosis with respect to the distal vessel.  There is calcified atherosclerosis in the distal cervical left ICA just proximal to the skull base which also does not result in hemodynamically significant stenosis.  The left vertebral artery origin is obscured similar to that on the right.  The left vertebral artery is patent in the neck to the skull base.   Review of the MIP images confirms the above findings.  IMPRESSION: 1. High-grade right ICA origin stenosis related to calcified and soft plaque, and suggestion of ulcerated plaque with focal dissection (series 8 image 77). 2.  Left carotid atherosclerosis without hemodynamically significant stenosis in the neck. 3.  Suboptimal intraarterial contrast bolus timing in the neck. Vertebral artery origins obscured by streak artifact. 4.  See intracranial findings below. 5.  Extensive postoperative changes to the cervical spine.  CTA HEAD  Findings:  Interval paranasal sinus surgery.  Residual mucosal thickening.  Mastoids are clear. No evidence of previous craniotomy.  No suspicious calvarial osseous lesion. Visualized scalp soft tissues are within normal limits.  Stable cerebral volume.  No ventriculomegaly. No midline shift, mass effect, or evidence of mass lesion.  No acute  intracranial hemorrhage identified.  No evidence of cortically based acute infarction identified.  No abnormal enhancement identified.  Vascular Findings: Major intracranial venous structures appear to be enhancing.  Suboptimal intraarterial contrast bolus timing. Diminutive distal vertebral arteries are codominant.  The vertebrobasilar junction is patent with calcified atherosclerosis. No definite basilar artery stenosis; the basilar is diminutive owing to bilateral fetal type PCA origins.  Proximal PCAs are patent.  No definite major PCA branch occlusion.  Both ICA siphons are patent with little to no calcified atherosclerosis.  Carotid termini are patent.  MCA and ACA origins are within normal limits.  The anterior communicating artery is diminutive or absent.  Visualized ACA branches are grossly normal.  Both MCA M1 segment are patent without evidence of stenosis.  The visualized bilateral MCA branches are within normal limits.   Review of the MIP images confirms the above findings.  IMPRESSION: 1.  Suboptimal contrast bolus timing.  No proximal intracranial stenosis or major circle of Willis branch occlusion. 2.  Diminutive posterior circulation on the basis of bilateral fetal PCA origins.  3. Normal for age CT appearance of the brain. 4.  Chronic paranasal sinus disease status post sinus surgery.  Original Report Authenticated By: Harley Hallmark, M.D.   Dg Chest 1 View  12/04/2011  *RADIOLOGY REPORT*  Clinical Data: Back pain and uncontrollable shaking and weakness. History of prostate cancer.  CHEST - 1 VIEW  Comparison: Chest x-ray 08/06/2010.  Findings: Study is limited by lack of visualization of the right costophrenic sulcus.  Lung volumes are lower limits of normal.  No consolidative airspace disease.  No left pleural effusion. Pulmonary vasculature appears slightly  crowded, likely related to slightly diminished lung volumes.  No evidence of pulmonary edema. No definite suspicious appearing pulmonary  nodules or masses. Heart size is upper limits of normal.  Mediastinal contours are unremarkable.  Orthopedic fixation hardware is incompletely visualized in the lower cervical spine.  IMPRESSION: 1.  No radiographic evidence of acute cardiopulmonary disease.  Original Report Authenticated By: Florencia Reasons, M.D.   Ct Angio Neck W/cm &/or Wo/cm  12/04/2011  *RADIOLOGY REPORT*  Clinical Data:  66 year old male with abnormal shaking.  Pain. Flushing.  History of prostate cancer.  Prior cervical spine and brain surgery not otherwise specified.  CT ANGIOGRAPHY HEAD AND NECK  Technique:  Multidetector CT imaging of the head and neck was performed using the standard protocol during bolus administration of intravenous contrast.  Multiplanar CT image reconstructions including MIPs were obtained to evaluate the vascular anatomy. Carotid stenosis measurements (when applicable) are obtained utilizing NASCET criteria, using the distal internal carotid diameter as the denominator.  Contrast: OMNIPAQUE IOHEXOL 350 MG/ML IV SOLN 100 ml Omnipaque 350.  Comparison:  Cervical MRI 05/11/2005.  Head CT without contrast 08/05/2003.  CTA NECK  Findings:  Negative visualized lung apices.  No superior mediastinal lymphadenopathy.  Negative thyroid, larynx, pharynx, parapharyngeal spaces, retropharyngeal space, sublingual space, submandibular and parotid glands.  Postoperative changes to the globes.  Previous paranasal sinus surgery with residual widespread mucosal thickening.  Mastoids are clear.  Previous anterior and posterior cervical fusion from C3 to T1 including multilevel cervical corpectomy.  Subsequent fusion hardware streak artifact at multiple levels.  No suspicious osseous lesion is identified.  Vascular Findings: Mild arch atherosclerosis.  Great vessel origins are within normal limits.  Suboptimal intraarterial contrast bolus timing in the neck.  Normal right common carotid artery to the level of the bifurcation.  .  Soft and calcified plaque occurs at the right carotid bifurcation and involves the right ICA origin and bulb.  Focal intimal flap (series 8 image 77) through the right ICA origin. High-grade right ICA stenosis just beyond this level (image 79). The cervical right ICA remains patent to the skull base.  Right vertebral artery origin largely obscured by hardware and body habitus streak artifact.  The right vertebral artery is patent to the skull base.  Normal left common carotid artery to the carotid bifurcation. Calcified atherosclerosis occurs at the bifurcation but results in less than 50 % stenosis with respect to the distal vessel.  There is calcified atherosclerosis in the distal cervical left ICA just proximal to the skull base which also does not result in hemodynamically significant stenosis.  The left vertebral artery origin is obscured similar to that on the right.  The left vertebral artery is patent in the neck to the skull base.   Review of the MIP images confirms the above findings.  IMPRESSION: 1. High-grade right ICA origin stenosis related to calcified and soft plaque, and suggestion of ulcerated plaque with focal dissection (series 8 image 77). 2.  Left carotid atherosclerosis without hemodynamically significant stenosis in the neck. 3.  Suboptimal intraarterial contrast bolus timing in the neck. Vertebral artery origins obscured by streak artifact. 4.  See intracranial findings below. 5.  Extensive postoperative changes to the cervical spine.  CTA HEAD  Findings:  Interval paranasal sinus surgery.  Residual mucosal thickening.  Mastoids are clear. No evidence of previous craniotomy.  No suspicious calvarial osseous lesion. Visualized scalp soft tissues are within normal limits.  Stable cerebral volume.  No ventriculomegaly. No midline  shift, mass effect, or evidence of mass lesion.  No acute intracranial hemorrhage identified.  No evidence of cortically based acute infarction identified.  No  abnormal enhancement identified.  Vascular Findings: Major intracranial venous structures appear to be enhancing.  Suboptimal intraarterial contrast bolus timing. Diminutive distal vertebral arteries are codominant.  The vertebrobasilar junction is patent with calcified atherosclerosis. No definite basilar artery stenosis; the basilar is diminutive owing to bilateral fetal type PCA origins.  Proximal PCAs are patent.  No definite major PCA branch occlusion.  Both ICA siphons are patent with little to no calcified atherosclerosis.  Carotid termini are patent.  MCA and ACA origins are within normal limits.  The anterior communicating artery is diminutive or absent.  Visualized ACA branches are grossly normal.  Both MCA M1 segment are patent without evidence of stenosis.  The visualized bilateral MCA branches are within normal limits.   Review of the MIP images confirms the above findings.  IMPRESSION: 1.  Suboptimal contrast bolus timing.  No proximal intracranial stenosis or major circle of Willis branch occlusion. 2.  Diminutive posterior circulation on the basis of bilateral fetal PCA origins.  3. Normal for age CT appearance of the brain. 4.  Chronic paranasal sinus disease status post sinus surgery.  Original Report Authenticated By: Harley Hallmark, M.D.     No diagnosis found.    MDM  Acute onset of lower back pain without neurological deficit. Patient is very uncomfortable difficult obtaining history from. Consider aortic pathology, radiculopathy, nephrolithiasis, metastatic prostate lesions.  CT negative for aortic pathology or nephrolithiasis. No motor or pulse deficit.  Further history obtained from family members who state patient has a history of CSF leak that was repaired at [facility] one year ago. It is unknown what caused this leak. The shaking he is exhibiting now he exhibited at that time as well. Patient denies any headache, vomiting. He'll return to CT for imaging of his head.  Patient  continues to demonstrate shaking episodes of his bilateral upper extremities and complained of back pain. He states the pain is now further up in his back than it was before.  He is given Ativan and Dilaudid with some relief of his symptoms. His blood pressure has improved his heart rate is improved though he remains hypertensive and tachycardic. Recheck rectal temp 103.  Given AMS and fever, will empirically cover with vancomycin and rocephin for meningitis.  No obvious meningismus, patient with history of multiple neck surgeries.  Steroids given.  Unable to perform LP given patient's agitation as well as heparin begun for carotid dissection. Discussed presentation with ENT team at [facility] (Dr. Baldomero Lamy).  No current signs of CSF leak, shaking episodes likely related to infectious pathology.  CT head without ICH or other obvious pathology.  [name] dissection flap discussed with Dr. Roseanne Reno of neurology.  Agrees not responsible for patient's symptoms but needs to be treated with anticoagulation and formal angiogram.   D/w Dr. Delford Field, PCCM at [facility].  Agrees to admit patient to neuro ICU.  Patient remains hypertensive, tachycardic, confused but protecting airway and following commands.   Date: 12/04/2011  Rate: 113  Rhythm: normal sinus rhythm  QRS Axis: normal  Intervals: normal  ST/T Wave abnormalities: normal  Conduction Disutrbances:none  Narrative Interpretation:   Old EKG Reviewed: unchanged  CRITICAL CARE Performed by: Glynn Octave   Total critical care time: 60  Critical care time was exclusive of separately billable procedures and treating other patients.  Critical care was  necessary to treat or prevent imminent or life-threatening deterioration.  Critical care was time spent personally by me on the following activities: development of treatment plan with patient and/or surrogate as well as nursing, discussions with consultants, evaluation of patient's response to treatment,  examination of patient, obtaining history from patient or surrogate, ordering and performing treatments and interventions, ordering and review of laboratory studies, ordering and review of radiographic studies, pulse oximetry and re-evaluation of patient's condition.         Glynn Octave, MD 12/05/11 [phone]

## 2011-12-04 NOTE — ED Notes (Signed)
Ice packs applied to back of neck & groin.

## 2011-12-04 NOTE — ED Provider Notes (Addendum)
History     CSN: [id number]  Arrival date & time 12/04/11  1928   First MD Initiated Contact with Patient 12/04/11 1931      Chief Complaint  Patient presents with  . Shaking    (Consider location/radiation/quality/duration/timing/severity/associated sxs/prior treatment) HPI....see Dr Randel Books note Past Medical History  Diagnosis Date  . Hyperlipidemia   . Sleep apnea   . Personal history of malignant neoplasm of prostate   . Malignant neoplasm of bladder, part unspecified   . Peripheral vascular disease, unspecified   . Personal history of other diseases of digestive system   . Osteoarthrosis, unspecified whether generalized or localized, unspecified site   . Other constipation   . Diabetes mellitus   . Hypertension   . Gouty arthritis   . Prolapsed cervical disc   . Carotid stenosis, right     severe, asymptomatic  . OSA on CPAP   . Obesity   . Cellulitis Apr 25, 2011    right leg    Past Surgical History  Procedure Date  . Cervical fusion   . Appendectomy   . Brain surgery 2011  . Septoplasty   . Cataract extraction, bilateral   . Colonoscopy 04/2011    Family History  Problem Relation Age of Onset  . Cancer Other     Family hx  . Diabetes Other     family hx  . Arthritis Other     family hx  . Cancer Mother   . Heart disease Father     MI  . Colon cancer Brother     History  Substance Use Topics  . Smoking status: Former Smoker -- 1.0 packs/day    Types: Cigarettes  . Smokeless tobacco: Never Used  . Alcohol Use: No      Review of Systems  All other systems reviewed and are negative.    Allergies  Pregabalin  Home Medications   Current Outpatient Rx  Name Route Sig Dispense Refill  . ALLOPURINOL 300 MG PO TABS Oral Take 300 mg by mouth at bedtime.     Marland Kitchen AMLODIPINE BESYLATE 5 MG PO TABS Oral Take 1 tablet (5 mg total) by mouth daily. 30 tablet 6  . ASPIRIN 81 MG PO TBEC Oral Take 162 mg by mouth at bedtime.     Marland Kitchen VITAMIN D 1000  UNITS PO TABS Oral Take 1,000 Units by mouth daily.    Marland Kitchen DICLOFENAC SODIUM 75 MG PO TBEC Oral Take 75 mg by mouth 2 (two) times daily. For 1 to 2 weeks routinely    . DOCUSATE SODIUM 100 MG PO CAPS Oral Take 200 mg by mouth at bedtime.    . FUROSEMIDE 40 MG PO TABS Oral Take 40 mg by mouth daily. 1 tablet po prn for SOB or swelling     . GABAPENTIN 300 MG PO CAPS Oral Take 300 mg by mouth 3 (three) times daily.      Marland Kitchen GLIMEPIRIDE 2 MG PO TABS Oral Take 2 mg by mouth 2 (two) times daily.      . INSULIN DETEMIR 100 UNIT/ML Bowler SOLN Subcutaneous Inject 8 Units into the skin at bedtime.     Marland Kitchen LABETALOL HCL 300 MG PO TABS Oral Take 300 mg by mouth 2 (two) times daily.      Marland Kitchen LOSARTAN POTASSIUM 100 MG PO TABS Oral Take 1 tablet (100 mg total) by mouth daily. 30 tablet 6  . NIACIN ER (ANTIHYPERLIPIDEMIC) 1000 MG PO TBCR Oral Take 2,000 mg  by mouth at bedtime.     . OMEGA-3-ACID ETHYL ESTERS 1 G PO CAPS Oral Take 1 g by mouth 2 (two) times daily.     Marland Kitchen POTASSIUM GLUCONATE 595 MG PO CAPS Oral Take 1 capsule by mouth daily.    Marland Kitchen ROSUVASTATIN CALCIUM 5 MG PO TABS Oral Take 5 mg by mouth daily.      Marland Kitchen SITAGLIPTIN PHOSPHATE 100 MG PO TABS Oral Take 100 mg by mouth daily.      Marland Kitchen TAMSULOSIN HCL 0.4 MG PO CAPS Oral Take 0.4 mg by mouth at bedtime.    . ACETAMINOPHEN 500 MG PO TABS Oral Take 500 mg by mouth every 6 (six) hours as needed.      . COLCHICINE 0.6 MG PO TABS Oral Take 0.6 mg by mouth daily as needed.      Marland Kitchen HYDROCODONE-ACETAMINOPHEN 5-325 MG PO TABS Oral Take 1 tablet by mouth every 6 (six) hours as needed. For pain      BP 130/65  Pulse 114  Temp(Src) 104 F (40 C) (Rectal)  Resp 22  SpO2 96%  Physical Exam  Constitutional:       See Dr Randel Books note    ED Course  Procedures (including critical care time)  Labs Reviewed  DIFFERENTIAL - Abnormal; Notable for the following:    Lymphocytes Relative 11 (*)    Monocytes Absolute 1.2 (*)    All other components within normal limits    BASIC METABOLIC PANEL - Abnormal; Notable for the following:    Glucose, Bld 120 (*)    GFR calc non Af Amer 62 (*)    GFR calc Af Amer 72 (*)    All other components within normal limits  URINALYSIS, ROUTINE W REFLEX MICROSCOPIC - Abnormal; Notable for the following:    Color, Urine STRAW (*)    Specific Gravity, Urine <1.005 (*)    Hgb urine dipstick SMALL (*)    All other components within normal limits  GLUCOSE, CAPILLARY - Abnormal; Notable for the following:    Glucose-Capillary 177 (*)    All other components within normal limits  CBC  TROPONIN I  LIPASE, BLOOD  HEPATIC FUNCTION PANEL  CULTURE, BLOOD (ROUTINE X 2)  CULTURE, BLOOD (ROUTINE X 2)  LACTIC ACID, PLASMA  URINE MICROSCOPIC-ADD ON  CSF CELL COUNT WITH DIFFERENTIAL  CSF CULTURE  PROTEIN AND GLUCOSE, CSF   Ct Abdomen Pelvis Wo Contrast  12/04/2011  *RADIOLOGY REPORT*  Clinical Data: Severe back and leg pain  CT ABDOMEN AND PELVIS WITHOUT CONTRAST  Technique:  Multidetector CT imaging of the abdomen and pelvis was performed following the standard protocol without intravenous contrast.  Comparison: 11/13/2007  Findings: The lung bases are clear.  No pericardial or pleural effusion identified.  Within the limitations of noncontrast technique the liver parenchyma appears intact.  Gallbladder is negative.  There is no biliary dilatation.  The pancreas appears normal.  Normal appearance of both adrenal glands.  The spleen appears normal.  Mild, symmetric perinephric fat stranding is identified and appears unchanged from previous exam.  Low density structures within both kidneys are again noted, compatible with cysts.  These are incompletely characterized on today's exam due to lack of IV contrast.  No upper abdominal adenopathy identified.  There is no pelvic or inguinal adenopathy.  Seed implants identified within the prostate gland.  The urinary bladder appears normal.  The stomach and the small bowel loops are normal.  The  proximal colon is normal.  There are multiple diverticula arising from the left colon and sigmoid colon without evidence for acute inflammation.  No free fluid or abnormal fluid collections within the abdomen or pelvis.  Normal caliber of the abdominal aorta.  No aneurysm.  Review of the visualized osseous structures shows evidence of multilevel lumbar degenerative disc disease.  There is a first- degree anterolisthesis of L4 on L5.  IMPRESSION:  1.  No acute findings identified within the abdomen or pelvis.  Original Report Authenticated By: Rosealee Albee, M.D.   Ct Angio Head W/cm &/or Wo Cm  12/04/2011  *RADIOLOGY REPORT*  Clinical Data:  66 year old male with abnormal shaking.  Pain. Flushing.  History of prostate cancer.  Prior cervical spine and brain surgery not otherwise specified.  CT ANGIOGRAPHY HEAD AND NECK  Technique:  Multidetector CT imaging of the head and neck was performed using the standard protocol during bolus administration of intravenous contrast.  Multiplanar CT image reconstructions including MIPs were obtained to evaluate the vascular anatomy. Carotid stenosis measurements (when applicable) are obtained utilizing NASCET criteria, using the distal internal carotid diameter as the denominator.  Contrast: OMNIPAQUE IOHEXOL 350 MG/ML IV SOLN 100 ml Omnipaque 350.  Comparison:  Cervical MRI 05/11/2005.  Head CT without contrast 08/05/2003.  CTA NECK  Findings:  Negative visualized lung apices.  No superior mediastinal lymphadenopathy.  Negative thyroid, larynx, pharynx, parapharyngeal spaces, retropharyngeal space, sublingual space, submandibular and parotid glands.  Postoperative changes to the globes.  Previous paranasal sinus surgery with residual widespread mucosal thickening.  Mastoids are clear.  Previous anterior and posterior cervical fusion from C3 to T1 including multilevel cervical corpectomy.  Subsequent fusion hardware streak artifact at multiple levels.  No suspicious  osseous lesion is identified.  Vascular Findings: Mild arch atherosclerosis.  Great vessel origins are within normal limits.  Suboptimal intraarterial contrast bolus timing in the neck.  Normal right common carotid artery to the level of the bifurcation. .  Soft and calcified plaque occurs at the right carotid bifurcation and involves the right ICA origin and bulb.  Focal intimal flap (series 8 image 77) through the right ICA origin. High-grade right ICA stenosis just beyond this level (image 79). The cervical right ICA remains patent to the skull base.  Right vertebral artery origin largely obscured by hardware and body habitus streak artifact.  The right vertebral artery is patent to the skull base.  Normal left common carotid artery to the carotid bifurcation. Calcified atherosclerosis occurs at the bifurcation but results in less than 50 % stenosis with respect to the distal vessel.  There is calcified atherosclerosis in the distal cervical left ICA just proximal to the skull base which also does not result in hemodynamically significant stenosis.  The left vertebral artery origin is obscured similar to that on the right.  The left vertebral artery is patent in the neck to the skull base.   Review of the MIP images confirms the above findings.  IMPRESSION: 1. High-grade right ICA origin stenosis related to calcified and soft plaque, and suggestion of ulcerated plaque with focal dissection (series 8 image 77). 2.  Left carotid atherosclerosis without hemodynamically significant stenosis in the neck. 3.  Suboptimal intraarterial contrast bolus timing in the neck. Vertebral artery origins obscured by streak artifact. 4.  See intracranial findings below. 5.  Extensive postoperative changes to the cervical spine.  CTA HEAD  Findings:  Interval paranasal sinus surgery.  Residual mucosal thickening.  Mastoids are clear. No evidence of previous craniotomy.  No suspicious calvarial osseous lesion. Visualized scalp soft  tissues are within normal limits.  Stable cerebral volume.  No ventriculomegaly. No midline shift, mass effect, or evidence of mass lesion.  No acute intracranial hemorrhage identified.  No evidence of cortically based acute infarction identified.  No abnormal enhancement identified.  Vascular Findings: Major intracranial venous structures appear to be enhancing.  Suboptimal intraarterial contrast bolus timing. Diminutive distal vertebral arteries are codominant.  The vertebrobasilar junction is patent with calcified atherosclerosis. No definite basilar artery stenosis; the basilar is diminutive owing to bilateral fetal type PCA origins.  Proximal PCAs are patent.  No definite major PCA branch occlusion.  Both ICA siphons are patent with little to no calcified atherosclerosis.  Carotid termini are patent.  MCA and ACA origins are within normal limits.  The anterior communicating artery is diminutive or absent.  Visualized ACA branches are grossly normal.  Both MCA M1 segment are patent without evidence of stenosis.  The visualized bilateral MCA branches are within normal limits.   Review of the MIP images confirms the above findings.  IMPRESSION: 1.  Suboptimal contrast bolus timing.  No proximal intracranial stenosis or major circle of Willis branch occlusion. 2.  Diminutive posterior circulation on the basis of bilateral fetal PCA origins.  3. Normal for age CT appearance of the brain. 4.  Chronic paranasal sinus disease status post sinus surgery.  Original Report Authenticated By: Harley Hallmark, M.D.   Dg Chest 1 View  12/04/2011  *RADIOLOGY REPORT*  Clinical Data: Back pain and uncontrollable shaking and weakness. History of prostate cancer.  CHEST - 1 VIEW  Comparison: Chest x-ray 08/06/2010.  Findings: Study is limited by lack of visualization of the right costophrenic sulcus.  Lung volumes are lower limits of normal.  No consolidative airspace disease.  No left pleural effusion. Pulmonary vasculature  appears slightly crowded, likely related to slightly diminished lung volumes.  No evidence of pulmonary edema. No definite suspicious appearing pulmonary nodules or masses. Heart size is upper limits of normal.  Mediastinal contours are unremarkable.  Orthopedic fixation hardware is incompletely visualized in the lower cervical spine.  IMPRESSION: 1.  No radiographic evidence of acute cardiopulmonary disease.  Original Report Authenticated By: Florencia Reasons, M.D.   Ct Angio Neck W/cm &/or Wo/cm  12/04/2011  *RADIOLOGY REPORT*  Clinical Data:  66 year old male with abnormal shaking.  Pain. Flushing.  History of prostate cancer.  Prior cervical spine and brain surgery not otherwise specified.  CT ANGIOGRAPHY HEAD AND NECK  Technique:  Multidetector CT imaging of the head and neck was performed using the standard protocol during bolus administration of intravenous contrast.  Multiplanar CT image reconstructions including MIPs were obtained to evaluate the vascular anatomy. Carotid stenosis measurements (when applicable) are obtained utilizing NASCET criteria, using the distal internal carotid diameter as the denominator.  Contrast: OMNIPAQUE IOHEXOL 350 MG/ML IV SOLN 100 ml Omnipaque 350.  Comparison:  Cervical MRI 05/11/2005.  Head CT without contrast 08/05/2003.  CTA NECK  Findings:  Negative visualized lung apices.  No superior mediastinal lymphadenopathy.  Negative thyroid, larynx, pharynx, parapharyngeal spaces, retropharyngeal space, sublingual space, submandibular and parotid glands.  Postoperative changes to the globes.  Previous paranasal sinus surgery with residual widespread mucosal thickening.  Mastoids are clear.  Previous anterior and posterior cervical fusion from C3 to T1 including multilevel cervical corpectomy.  Subsequent fusion hardware streak artifact at multiple levels.  No suspicious osseous lesion is identified.  Vascular Findings: Mild arch atherosclerosis.  Great vessel origins  are within normal limits.  Suboptimal intraarterial contrast bolus timing in the neck.  Normal right common carotid artery to the level of the bifurcation. .  Soft and calcified plaque occurs at the right carotid bifurcation and involves the right ICA origin and bulb.  Focal intimal flap (series 8 image 77) through the right ICA origin. High-grade right ICA stenosis just beyond this level (image 79). The cervical right ICA remains patent to the skull base.  Right vertebral artery origin largely obscured by hardware and body habitus streak artifact.  The right vertebral artery is patent to the skull base.  Normal left common carotid artery to the carotid bifurcation. Calcified atherosclerosis occurs at the bifurcation but results in less than 50 % stenosis with respect to the distal vessel.  There is calcified atherosclerosis in the distal cervical left ICA just proximal to the skull base which also does not result in hemodynamically significant stenosis.  The left vertebral artery origin is obscured similar to that on the right.  The left vertebral artery is patent in the neck to the skull base.   Review of the MIP images confirms the above findings.  IMPRESSION: 1. High-grade right ICA origin stenosis related to calcified and soft plaque, and suggestion of ulcerated plaque with focal dissection (series 8 image 77). 2.  Left carotid atherosclerosis without hemodynamically significant stenosis in the neck. 3.  Suboptimal intraarterial contrast bolus timing in the neck. Vertebral artery origins obscured by streak artifact. 4.  See intracranial findings below. 5.  Extensive postoperative changes to the cervical spine.  CTA HEAD  Findings:  Interval paranasal sinus surgery.  Residual mucosal thickening.  Mastoids are clear. No evidence of previous craniotomy.  No suspicious calvarial osseous lesion. Visualized scalp soft tissues are within normal limits.  Stable cerebral volume.  No ventriculomegaly. No midline shift,  mass effect, or evidence of mass lesion.  No acute intracranial hemorrhage identified.  No evidence of cortically based acute infarction identified.  No abnormal enhancement identified.  Vascular Findings: Major intracranial venous structures appear to be enhancing.  Suboptimal intraarterial contrast bolus timing. Diminutive distal vertebral arteries are codominant.  The vertebrobasilar junction is patent with calcified atherosclerosis. No definite basilar artery stenosis; the basilar is diminutive owing to bilateral fetal type PCA origins.  Proximal PCAs are patent.  No definite major PCA branch occlusion.  Both ICA siphons are patent with little to no calcified atherosclerosis.  Carotid termini are patent.  MCA and ACA origins are within normal limits.  The anterior communicating artery is diminutive or absent.  Visualized ACA branches are grossly normal.  Both MCA M1 segment are patent without evidence of stenosis.  The visualized bilateral MCA branches are within normal limits.   Review of the MIP images confirms the above findings.  IMPRESSION: 1.  Suboptimal contrast bolus timing.  No proximal intracranial stenosis or major circle of Willis branch occlusion. 2.  Diminutive posterior circulation on the basis of bilateral fetal PCA origins.  3. Normal for age CT appearance of the brain. 4.  Chronic paranasal sinus disease status post sinus surgery.  Original Report Authenticated By: Harley Hallmark, M.D.     No diagnosis found.    MDM   Patient seen admitted by Dr. Anastasia Fiedler, MD 12/25/11 1610  Donnetta Hutching, MD 02/26/12 1216  Donnetta Hutching, MD 04/02/12 1723  Donnetta Hutching, MD 04/23/12 1622

## 2011-12-05 ENCOUNTER — Encounter (HOSPITAL_COMMUNITY): Payer: Self-pay

## 2011-12-05 ENCOUNTER — Inpatient Hospital Stay (HOSPITAL_COMMUNITY): Payer: Medicare Other

## 2011-12-05 DIAGNOSIS — E119 Type 2 diabetes mellitus without complications: Secondary | ICD-10-CM

## 2011-12-05 DIAGNOSIS — M545 Low back pain, unspecified: Secondary | ICD-10-CM

## 2011-12-05 DIAGNOSIS — I6529 Occlusion and stenosis of unspecified carotid artery: Secondary | ICD-10-CM

## 2011-12-05 DIAGNOSIS — G9340 Encephalopathy, unspecified: Secondary | ICD-10-CM

## 2011-12-05 LAB — CBC
HCT: 36.9 % — ABNORMAL LOW (ref 39.0–52.0)
MCH: 31.1 pg (ref 26.0–34.0)
MCHC: 34.4 g/dL (ref 30.0–36.0)
MCV: 90.4 fL (ref 78.0–100.0)
RDW: 13.2 % (ref 11.5–15.5)

## 2011-12-05 LAB — MRSA PCR SCREENING: MRSA by PCR: NEGATIVE

## 2011-12-05 LAB — GLUCOSE, CAPILLARY

## 2011-12-05 LAB — HEPARIN LEVEL (UNFRACTIONATED): Heparin Unfractionated: 0.24 IU/mL — ABNORMAL LOW (ref 0.30–0.70)

## 2011-12-05 MED ORDER — POTASSIUM GLUCONATE 595 MG PO CAPS: 1.0000 | ORAL_CAPSULE | Freq: Every day | ORAL | Status: DC

## 2011-12-05 MED ORDER — SODIUM CHLORIDE 0.9 % IV SOLN
INTRAVENOUS | Status: AC
  Administered 2011-12-06: 02:00:00 via INTRAVENOUS

## 2011-12-05 MED ORDER — MIDAZOLAM HCL 5 MG/5ML IJ SOLN
INTRAMUSCULAR | Status: DC | PRN
  Administered 2011-12-05: 1 mg via INTRAVENOUS

## 2011-12-05 MED ORDER — ATORVASTATIN CALCIUM 10 MG PO TABS
10.0000 mg | ORAL_TABLET | Freq: Every day | ORAL | Status: DC
  Administered 2011-12-05: 10 mg via ORAL
  Filled 2011-12-05 (×2): qty 1

## 2011-12-05 MED ORDER — VANCOMYCIN HCL IN DEXTROSE 1-5 GM/200ML-% IV SOLN
1000.0000 mg | Freq: Two times a day (BID) | INTRAVENOUS | Status: DC
  Administered 2011-12-05 – 2011-12-06 (×3): 1000 mg via INTRAVENOUS
  Filled 2011-12-05 (×4): qty 200

## 2011-12-05 MED ORDER — HYDROCODONE-ACETAMINOPHEN 5-325 MG PO TABS
1.0000 | ORAL_TABLET | Freq: Four times a day (QID) | ORAL | Status: DC | PRN
  Administered 2011-12-06: 1 via ORAL
  Filled 2011-12-05 (×2): qty 1

## 2011-12-05 MED ORDER — ASPIRIN EC 81 MG PO TBEC
162.0000 mg | DELAYED_RELEASE_TABLET | Freq: Every day | ORAL | Status: DC
  Administered 2011-12-05: 162 mg via ORAL
  Filled 2011-12-05 (×2): qty 2

## 2011-12-05 MED ORDER — DOCUSATE SODIUM 100 MG PO CAPS
200.0000 mg | ORAL_CAPSULE | Freq: Every day | ORAL | Status: DC
  Administered 2011-12-05: 200 mg via ORAL
  Filled 2011-12-05: qty 2

## 2011-12-05 MED ORDER — OMEGA-3-ACID ETHYL ESTERS 1 G PO CAPS
1.0000 g | ORAL_CAPSULE | Freq: Two times a day (BID) | ORAL | Status: DC
  Administered 2011-12-05 – 2011-12-06 (×2): 1 g via ORAL
  Filled 2011-12-05 (×5): qty 1

## 2011-12-05 MED ORDER — LABETALOL HCL 300 MG PO TABS
300.0000 mg | ORAL_TABLET | Freq: Two times a day (BID) | ORAL | Status: DC
  Administered 2011-12-05 – 2011-12-06 (×4): 300 mg via ORAL
  Filled 2011-12-05 (×5): qty 1

## 2011-12-05 MED ORDER — HEPARIN (PORCINE) IN NACL 100-0.45 UNIT/ML-% IJ SOLN
1100.0000 [IU]/h | INTRAMUSCULAR | Status: DC
  Administered 2011-12-05: 1100 [IU]/h via INTRAVENOUS
  Filled 2011-12-05 (×2): qty 250

## 2011-12-05 MED ORDER — DEXTROSE 5 % IV SOLN
2.0000 g | Freq: Two times a day (BID) | INTRAVENOUS | Status: DC
  Administered 2011-12-05 – 2011-12-06 (×3): 2 g via INTRAVENOUS
  Filled 2011-12-05 (×5): qty 2

## 2011-12-05 MED ORDER — SODIUM CHLORIDE 0.9 % IV SOLN
INTRAVENOUS | Status: DC
  Administered 2011-12-05: 03:00:00 via INTRAVENOUS

## 2011-12-05 MED ORDER — LOSARTAN POTASSIUM 50 MG PO TABS
100.0000 mg | ORAL_TABLET | Freq: Every day | ORAL | Status: DC
  Administered 2011-12-05 – 2011-12-06 (×2): 100 mg via ORAL
  Filled 2011-12-05 (×2): qty 2

## 2011-12-05 MED ORDER — DICLOFENAC SODIUM 75 MG PO TBEC
75.0000 mg | DELAYED_RELEASE_TABLET | Freq: Two times a day (BID) | ORAL | Status: DC
  Administered 2011-12-05 – 2011-12-06 (×3): 75 mg via ORAL
  Filled 2011-12-05 (×5): qty 1

## 2011-12-05 MED ORDER — AMLODIPINE BESYLATE 5 MG PO TABS
5.0000 mg | ORAL_TABLET | Freq: Every day | ORAL | Status: DC
  Administered 2011-12-05 – 2011-12-06 (×2): 5 mg via ORAL
  Filled 2011-12-05 (×2): qty 1

## 2011-12-05 MED ORDER — MENTHOL 3 MG MT LOZG: 1.0000 | LOZENGE | OROMUCOSAL | Status: DC | PRN

## 2011-12-05 MED ORDER — GABAPENTIN 300 MG PO CAPS
300.0000 mg | ORAL_CAPSULE | Freq: Three times a day (TID) | ORAL | Status: DC
  Administered 2011-12-05 – 2011-12-06 (×5): 300 mg via ORAL
  Filled 2011-12-05 (×7): qty 1

## 2011-12-05 MED ORDER — TAMSULOSIN HCL 0.4 MG PO CAPS
0.4000 mg | ORAL_CAPSULE | Freq: Every day | ORAL | Status: DC
  Administered 2011-12-05 (×2): 0.4 mg via ORAL
  Filled 2011-12-05 (×3): qty 1

## 2011-12-05 MED ORDER — VANCOMYCIN HCL 1000 MG IV SOLR
1500.0000 mg | Freq: Once | INTRAVENOUS | Status: AC
  Administered 2011-12-05: 1500 mg via INTRAVENOUS
  Filled 2011-12-05: qty 1500

## 2011-12-05 MED ORDER — ALLOPURINOL 300 MG PO TABS
300.0000 mg | ORAL_TABLET | Freq: Every day | ORAL | Status: DC
  Administered 2011-12-05: 300 mg via ORAL
  Filled 2011-12-05 (×2): qty 1

## 2011-12-05 MED ORDER — IOHEXOL 300 MG/ML  SOLN
200.0000 mL | Freq: Once | INTRAMUSCULAR | Status: AC | PRN
  Administered 2011-12-05: 75 mL via INTRA_ARTERIAL

## 2011-12-05 MED ORDER — HEPARIN SOD (PORK) LOCK FLUSH 100 UNIT/ML IV SOLN
500.0000 [IU] | Freq: Once | INTRAVENOUS | Status: AC
  Administered 2011-12-05: 500 [IU] via INTRAVENOUS

## 2011-12-05 MED ORDER — FUROSEMIDE 40 MG PO TABS
40.0000 mg | ORAL_TABLET | Freq: Every day | ORAL | Status: DC
  Administered 2011-12-05 – 2011-12-06 (×2): 40 mg via ORAL
  Filled 2011-12-05 (×2): qty 1

## 2011-12-05 MED ORDER — NIACIN ER 500 MG PO CPCR
2000.0000 mg | ORAL_CAPSULE | Freq: Every day | ORAL | Status: DC
  Administered 2011-12-05: 2000 mg via ORAL
  Filled 2011-12-05 (×2): qty 4

## 2011-12-05 MED ORDER — VITAMIN D3 25 MCG (1000 UNIT) PO TABS
1000.0000 [IU] | ORAL_TABLET | Freq: Every day | ORAL | Status: DC
  Administered 2011-12-05 – 2011-12-06 (×2): 1000 [IU] via ORAL
  Filled 2011-12-05 (×2): qty 1

## 2011-12-05 MED ORDER — ACETAMINOPHEN 500 MG PO TABS
500.0000 mg | ORAL_TABLET | Freq: Four times a day (QID) | ORAL | Status: DC | PRN
  Filled 2011-12-05: qty 1

## 2011-12-05 MED ORDER — FENTANYL CITRATE 0.05 MG/ML IJ SOLN
INTRAMUSCULAR | Status: DC | PRN
  Administered 2011-12-05 (×2): 25 ug via INTRAVENOUS

## 2011-12-05 NOTE — H&P (Signed)
[name] is an 66 y.o. male.   Chief Complaint: onset back pain; known Right carotid stenosis MRI/MRA shows [name] stenosis and possible dissection HPI: DM; HTN; Prostate ca; Right carotid stenosis  Past Medical History  Diagnosis Date  . Hyperlipidemia   . Sleep apnea   . Personal history of malignant neoplasm of prostate   . Malignant neoplasm of bladder, part unspecified   . Peripheral vascular disease, unspecified   . Personal history of other diseases of digestive system   . Osteoarthrosis, unspecified whether generalized or localized, unspecified site   . Other constipation   . Diabetes mellitus   . Hypertension   . Gouty arthritis   . Prolapsed cervical disc   . Carotid stenosis, right     severe, asymptomatic  . OSA on CPAP   . Obesity   . Cellulitis Apr 25, 2011    right leg    Past Surgical History  Procedure Date  . Cervical fusion   . Appendectomy   . Brain surgery 2011  . Septoplasty   . Cataract extraction, bilateral   . Colonoscopy 04/2011    Family History  Problem Relation Age of Onset  . Cancer Other     Family hx  . Diabetes Other     family hx  . Arthritis Other     family hx  . Cancer Mother   . Heart disease Father     MI  . Colon cancer Brother    Social History:  reports that he has quit smoking. His smoking use included Cigarettes. He smoked 1 pack per day. He has never used smokeless tobacco. He reports that he does not drink alcohol or use illicit drugs.  Allergies:  Allergies  Allergen Reactions  . Pregabalin Nausea Only    Medications Prior to Admission  Medication Dose Route Frequency Provider Last Rate Last Dose  . 0.9 %  sodium chloride infusion   Intravenous Continuous Lonia Farber, MD 100 mL/hr at 12/05/11 0245    . acetaminophen (TYLENOL) tablet 1,000 mg  1,000 mg Oral Once Glynn Octave, MD   1,000 mg at 12/04/11 2157  . acetaminophen (TYLENOL) tablet 500 mg  500 mg Oral Q6H PRN Lonia Farber, MD      . allopurinol (ZYLOPRIM) tablet 300 mg  300 mg Oral QHS Konstantin Zubelevitskiy, MD      . amLODipine (NORVASC) tablet 5 mg  5 mg Oral Daily Lonia Farber, MD      . aspirin EC tablet 162 mg  162 mg Oral QHS Konstantin Zubelevitskiy, MD      . atorvastatin (LIPITOR) tablet 10 mg  10 mg Oral q1800 Konstantin Zubelevitskiy, MD      . cefTRIAXone (ROCEPHIN) 1 g in dextrose 5 % 50 mL IVPB  1 g Intravenous Once Glynn Octave, MD   1 g at 12/04/11 2200  . cefTRIAXone (ROCEPHIN) 2 g in dextrose 5 % 50 mL IVPB  2 g Intravenous Q12H Colleen Can, PHARMD   2 g at 12/05/11 1610  . cholecalciferol (VITAMIN D) tablet 1,000 Units  1,000 Units Oral Daily Konstantin Zubelevitskiy, MD      . dexamethasone (DECADRON) injection 10 mg  10 mg Intravenous Once Glynn Octave, MD   10 mg at 12/04/11 2204  . diazepam (VALIUM) tablet 5 mg  5 mg Oral Once Glynn Octave, MD   5 mg at 12/04/11 2003  . diclofenac (VOLTAREN) EC tablet 75 mg  75 mg Oral BID Adriana Reams  Zubelevitskiy, MD   75 mg at 12/05/11 0243  . docusate sodium (COLACE) capsule 200 mg  200 mg Oral QHS Konstantin Zubelevitskiy, MD      . furosemide (LASIX) tablet 40 mg  40 mg Oral Daily Konstantin Zubelevitskiy, MD      . gabapentin (NEURONTIN) capsule 300 mg  300 mg Oral TID Lonia Farber, MD   300 mg at 12/05/11 0243  . heparin ADULT infusion 100 units/mL (25000 units/250 mL)  16 Units/kg/hr (Order-Specific) Intravenous Once Glynn Octave, MD 15 mL/hr at 12/04/11 2209 16 Units/kg/hr at 12/04/11 2209  . heparin ADULT infusion 100 units/mL (25000 units/250 mL)  1,100 Units/hr Intravenous Continuous Colleen Can, PHARMD 11 mL/hr at 12/05/11 0302 1,100 Units/hr at 12/05/11 0302  . heparin bolus via infusion 4,000 Units  4,000 Units Intravenous Once Glynn Octave, MD   4,000 Units at 12/04/11 2209  . HYDROcodone-acetaminophen (NORCO) 5-325 MG per tablet 1 tablet  1 tablet Oral Q6H PRN Lonia Farber, MD      . HYDROmorphone (DILAUDID) injection 1 mg  1 mg Intravenous Once Glynn Octave, MD   1 mg at 12/04/11 1938  . HYDROmorphone (DILAUDID) injection 1 mg  1 mg Intravenous Once Glynn Octave, MD   1 mg at 12/04/11 2004  . ibuprofen (ADVIL,MOTRIN) tablet 800 mg  800 mg Oral Once Glynn Octave, MD   800 mg at 12/04/11 2157  . iohexol (OMNIPAQUE) 350 MG/ML injection 100 mL  100 mL Intravenous Once PRN Medication Radiologist, MD   100 mL at 12/04/11 2045  . labetalol (NORMODYNE) tablet 300 mg  300 mg Oral BID Lonia Farber, MD   300 mg at 12/05/11 0243  . LORazepam (ATIVAN) injection 1 mg  1 mg Intravenous Once Glynn Octave, MD   1 mg at 12/04/11 2018  . LORazepam (ATIVAN) injection 1 mg  1 mg Intravenous Once Glynn Octave, MD   1 mg at 12/04/11 2057  . losartan (COZAAR) tablet 100 mg  100 mg Oral Daily Konstantin Zubelevitskiy, MD      . metoprolol (LOPRESSOR) injection 5 mg  5 mg Intravenous Once Glynn Octave, MD   5 mg at 12/04/11 2030  . niacin CR capsule 2,000 mg  2,000 mg Oral QHS Konstantin Zubelevitskiy, MD      . omega-3 acid ethyl esters (LOVAZA) capsule 1 g  1 g Oral BID WC Konstantin Zubelevitskiy, MD      . ondansetron (ZOFRAN) injection 4 mg  4 mg Intravenous Once Glynn Octave, MD   4 mg at 12/04/11 1938  . sodium chloride 0.9 % bolus 1,000 mL  1,000 mL Intravenous Once Glynn Octave, MD   1,000 mL at 12/04/11 1938  . sodium chloride 0.9 % bolus 1,000 mL  1,000 mL Intravenous Once Glynn Octave, MD   1,000 mL at 12/04/11 2340  . Tamsulosin HCl (FLOMAX) capsule 0.4 mg  0.4 mg Oral QHS Lonia Farber, MD   0.4 mg at 12/05/11 0242  . vancomycin (VANCOCIN) 1,500 mg in sodium chloride 0.9 % 500 mL IVPB  1,500 mg Intravenous Once Colleen Can, PHARMD   1,500 mg at 12/05/11 0252  . vancomycin (VANCOCIN) IVPB 1000 mg/200 mL premix  1,000 mg Intravenous Once Glynn Octave, MD   1,000 mg at 12/04/11 2235  . vancomycin  (VANCOCIN) IVPB 1000 mg/200 mL premix  1,000 mg Intravenous Q12H Colleen Can, PHARMD      . DISCONTD: LORazepam (ATIVAN) injection 1 mg  1 mg Intravenous Once [employer],  MD      . DISCONTD: niCARdipine (CARDENE-IV) infusion (0.1 mg/ml)  5 mg/hr Intravenous Continuous Glynn Octave, MD      . DISCONTD: Potassium Gluconate CAPS 595 mg  1 capsule Oral Daily Lonia Farber, MD       Medications Prior to Admission  Medication Sig Dispense Refill  . allopurinol (ZYLOPRIM) 300 MG tablet Take 300 mg by mouth at bedtime.       Marland Kitchen amLODipine (NORVASC) 5 MG tablet Take 1 tablet (5 mg total) by mouth daily.  30 tablet  6  . aspirin 81 MG EC tablet Take 162 mg by mouth at bedtime.       . furosemide (LASIX) 40 MG tablet Take 40 mg by mouth daily. 1 tablet po prn for SOB or swelling       . gabapentin (NEURONTIN) 300 MG capsule Take 300 mg by mouth 3 (three) times daily.        Marland Kitchen glimepiride (AMARYL) 2 MG tablet Take 2 mg by mouth 2 (two) times daily.        . insulin detemir (LEVEMIR) 100 UNIT/ML injection Inject 8 Units into the skin at bedtime.       Marland Kitchen labetalol (NORMODYNE) 300 MG tablet Take 300 mg by mouth 2 (two) times daily.        Marland Kitchen losartan (COZAAR) 100 MG tablet Take 1 tablet (100 mg total) by mouth daily.  30 tablet  6  . niacin (NIASPAN) 1000 MG CR tablet Take 2,000 mg by mouth at bedtime.       Marland Kitchen omega-3 acid ethyl esters (LOVAZA) 1 G capsule Take 1 g by mouth 2 (two) times daily.       . rosuvastatin (CRESTOR) 5 MG tablet Take 5 mg by mouth daily.        . sitaGLIPtan (JANUVIA) 100 MG tablet Take 100 mg by mouth daily.        Marland Kitchen acetaminophen (TYLENOL) 500 MG tablet Take 500 mg by mouth every 6 (six) hours as needed.        . colchicine 0.6 MG tablet Take 0.6 mg by mouth daily as needed.        Marland Kitchen HYDROcodone-acetaminophen (NORCO) 5-325 MG per tablet Take 1 tablet by mouth every 6 (six) hours as needed. For pain        Results for orders placed during the hospital  encounter of 12/04/11 (from the past 48 hour(s))  CBC     Status: Normal   Collection Time   12/04/11  7:40 PM      Component Value Range Comment   WBC 10.4  4.0 - 10.5 (K/uL)    RBC 4.36  4.22 - 5.81 (MIL/uL)    Hemoglobin 13.8  13.0 - 17.0 (g/dL)    HCT 16.1  09.6 - 04.5 (%)    MCV 92.0  78.0 - 100.0 (fL)    MCH 31.7  26.0 - 34.0 (pg)    MCHC 34.4  30.0 - 36.0 (g/dL)    RDW 40.9  81.1 - 91.4 (%)    Platelets 156  150 - 400 (K/uL)   DIFFERENTIAL     Status: Abnormal   Collection Time   12/04/11  7:40 PM      Component Value Range Comment   Neutrophils Relative 74  43 - 77 (%)    Neutro Abs 7.7  1.7 - 7.7 (K/uL)    Lymphocytes Relative 11 (*) 12 - 46 (%)    Lymphs Abs  1.2  0.7 - 4.0 (K/uL)    Monocytes Relative 12  3 - 12 (%)    Monocytes Absolute 1.2 (*) 0.1 - 1.0 (K/uL)    Eosinophils Relative 3  0 - 5 (%)    Eosinophils Absolute 0.4  0.0 - 0.7 (K/uL)    Basophils Relative 0  0 - 1 (%)    Basophils Absolute 0.0  0.0 - 0.1 (K/uL)   BASIC METABOLIC PANEL     Status: Abnormal   Collection Time   12/04/11  7:40 PM      Component Value Range Comment   Sodium 140  135 - 145 (mEq/L)    Potassium 3.9  3.5 - 5.1 (mEq/L)    Chloride 100  96 - 112 (mEq/L)    CO2 29  19 - 32 (mEq/L)    Glucose, Bld 120 (*) 70 - 99 (mg/dL)    BUN 17  6 - 23 (mg/dL)    Creatinine, Ser 4.09  0.50 - 1.35 (mg/dL)    Calcium 81.1  8.4 - 10.5 (mg/dL)    GFR calc non Af Amer 62 (*) >90 (mL/min)    GFR calc Af Amer 72 (*) >90 (mL/min)   TROPONIN I     Status: Normal   Collection Time   12/04/11  7:40 PM      Component Value Range Comment   Troponin I <0.30  <0.30 (ng/mL)   LIPASE, BLOOD     Status: Normal   Collection Time   12/04/11  7:40 PM      Component Value Range Comment   Lipase 38  11 - 59 (U/L)   HEPATIC FUNCTION PANEL     Status: Normal   Collection Time   12/04/11  7:40 PM      Component Value Range Comment   Total Protein 8.0  6.0 - 8.3 (g/dL)    Albumin 4.5  3.5 - 5.2 (g/dL)    AST 27  0  - 37 (U/L)    ALT 28  0 - 53 (U/L)    Alkaline Phosphatase 79  39 - 117 (U/L)    Total Bilirubin 0.3  0.3 - 1.2 (mg/dL)    Bilirubin, Direct <9.1  0.0 - 0.3 (mg/dL)    Indirect Bilirubin NOT CALCULATED  0.3 - 0.9 (mg/dL)   CULTURE, BLOOD (ROUTINE X 2)     Status: Normal (Preliminary result)   Collection Time   12/04/11  8:44 PM      Component Value Range Comment   Specimen Description BLOOD LEFT ARM      Special Requests BOTTLES DRAWN AEROBIC AND ANAEROBIC 3CC EACH      Culture PENDING      Report Status PENDING     CULTURE, BLOOD (ROUTINE X 2)     Status: Normal (Preliminary result)   Collection Time   12/04/11  8:44 PM      Component Value Range Comment   Specimen Description BLOOD RIGHT ANTECUBITAL      Special Requests BOTTLES DRAWN AEROBIC AND ANAEROBIC 4CC EACH      Culture PENDING      Report Status PENDING     LACTIC ACID, PLASMA     Status: Normal   Collection Time   12/04/11  9:25 PM      Component Value Range Comment   Lactic Acid, Venous 1.5  0.5 - 2.2 (mmol/L)   URINALYSIS, ROUTINE W REFLEX MICROSCOPIC     Status: Abnormal   Collection Time  12/04/11 10:38 PM      Component Value Range Comment   Color, Urine STRAW (*) YELLOW     APPearance CLEAR  CLEAR     Specific Gravity, Urine <1.005 (*) 1.005 - 1.030     pH 6.0  5.0 - 8.0     Glucose, UA NEGATIVE  NEGATIVE (mg/dL)    Hgb urine dipstick SMALL (*) NEGATIVE     Bilirubin Urine NEGATIVE  NEGATIVE     Ketones, ur NEGATIVE  NEGATIVE (mg/dL)    Protein, ur NEGATIVE  NEGATIVE (mg/dL)    Urobilinogen, UA 0.2  0.0 - 1.0 (mg/dL)    Nitrite NEGATIVE  NEGATIVE     Leukocytes, UA NEGATIVE  NEGATIVE    URINE MICROSCOPIC-ADD ON     Status: Normal   Collection Time   12/04/11 10:38 PM      Component Value Range Comment   Squamous Epithelial / LPF RARE  RARE     WBC, UA 0-2  <3 (WBC/hpf)    RBC / HPF 0-2  <3 (RBC/hpf)    Bacteria, UA RARE  RARE    GLUCOSE, CAPILLARY     Status: Abnormal   Collection Time   12/04/11  11:34 PM      Component Value Range Comment   Glucose-Capillary 177 (*) 70 - 99 (mg/dL)   MRSA PCR SCREENING     Status: Normal   Collection Time   12/05/11  1:02 AM      Component Value Range Comment   MRSA by PCR NEGATIVE  NEGATIVE    PROCALCITONIN     Status: Normal   Collection Time   12/05/11  4:00 AM      Component Value Range Comment   Procalcitonin 0.36     HEPARIN LEVEL (UNFRACTIONATED)     Status: Abnormal   Collection Time   12/05/11  4:00 AM      Component Value Range Comment   Heparin Unfractionated 0.24 (*) 0.30 - 0.70 (IU/mL)   CBC     Status: Abnormal   Collection Time   12/05/11  4:00 AM      Component Value Range Comment   WBC 16.3 (*) 4.0 - 10.5 (K/uL)    RBC 4.08 (*) 4.22 - 5.81 (MIL/uL)    Hemoglobin 12.7 (*) 13.0 - 17.0 (g/dL)    HCT 62.9 (*) 52.8 - 52.0 (%)    MCV 90.4  78.0 - 100.0 (fL)    MCH 31.1  26.0 - 34.0 (pg)    MCHC 34.4  30.0 - 36.0 (g/dL)    RDW 41.3  24.4 - 01.0 (%)    Platelets 127 (*) 150 - 400 (K/uL)   PROTIME-INR     Status: Normal   Collection Time   12/05/11  4:00 AM      Component Value Range Comment   Prothrombin Time 14.1  11.6 - 15.2 (seconds)    INR 1.07  0.00 - 1.49     Ct Abdomen Pelvis Wo Contrast  12/04/2011  *RADIOLOGY REPORT*  Clinical Data: Severe back and leg pain  CT ABDOMEN AND PELVIS WITHOUT CONTRAST  Technique:  Multidetector CT imaging of the abdomen and pelvis was performed following the standard protocol without intravenous contrast.  Comparison: 11/13/2007  Findings: The lung bases are clear.  No pericardial or pleural effusion identified.  Within the limitations of noncontrast technique the liver parenchyma appears intact.  Gallbladder is negative.  There is no biliary dilatation.  The pancreas appears normal.  Normal appearance of both adrenal glands.  The spleen appears normal.  Mild, symmetric perinephric fat stranding is identified and appears unchanged from previous exam.  Low density structures within both kidneys  are again noted, compatible with cysts.  These are incompletely characterized on today's exam due to lack of IV contrast.  No upper abdominal adenopathy identified.  There is no pelvic or inguinal adenopathy.  Seed implants identified within the prostate gland.  The urinary bladder appears normal.  The stomach and the small bowel loops are normal.  The proximal colon is normal.  There are multiple diverticula arising from the left colon and sigmoid colon without evidence for acute inflammation.  No free fluid or abnormal fluid collections within the abdomen or pelvis.  Normal caliber of the abdominal aorta.  No aneurysm.  Review of the visualized osseous structures shows evidence of multilevel lumbar degenerative disc disease.  There is a first- degree anterolisthesis of L4 on L5.  IMPRESSION:  1.  No acute findings identified within the abdomen or pelvis.  Original Report Authenticated By: Rosealee Albee, M.D.   Ct Angio Head W/cm &/or Wo Cm  12/04/2011  *RADIOLOGY REPORT*  Clinical Data:  66 year old male with abnormal shaking.  Pain. Flushing.  History of prostate cancer.  Prior cervical spine and brain surgery not otherwise specified.  CT ANGIOGRAPHY HEAD AND NECK  Technique:  Multidetector CT imaging of the head and neck was performed using the standard protocol during bolus administration of intravenous contrast.  Multiplanar CT image reconstructions including MIPs were obtained to evaluate the vascular anatomy. Carotid stenosis measurements (when applicable) are obtained utilizing NASCET criteria, using the distal internal carotid diameter as the denominator.  Contrast: OMNIPAQUE IOHEXOL 350 MG/ML IV SOLN 100 ml Omnipaque 350.  Comparison:  Cervical MRI 05/11/2005.  Head CT without contrast 08/05/2003.  CTA NECK  Findings:  Negative visualized lung apices.  No superior mediastinal lymphadenopathy.  Negative thyroid, larynx, pharynx, parapharyngeal spaces, retropharyngeal space, sublingual space,  submandibular and parotid glands.  Postoperative changes to the globes.  Previous paranasal sinus surgery with residual widespread mucosal thickening.  Mastoids are clear.  Previous anterior and posterior cervical fusion from C3 to T1 including multilevel cervical corpectomy.  Subsequent fusion hardware streak artifact at multiple levels.  No suspicious osseous lesion is identified.  Vascular Findings: Mild arch atherosclerosis.  Great vessel origins are within normal limits.  Suboptimal intraarterial contrast bolus timing in the neck.  Normal right common carotid artery to the level of the bifurcation. .  Soft and calcified plaque occurs at the right carotid bifurcation and involves the right ICA origin and bulb.  Focal intimal flap (series 8 image 77) through the right ICA origin. High-grade right ICA stenosis just beyond this level (image 79). The cervical right ICA remains patent to the skull base.  Right vertebral artery origin largely obscured by hardware and body habitus streak artifact.  The right vertebral artery is patent to the skull base.  Normal left common carotid artery to the carotid bifurcation. Calcified atherosclerosis occurs at the bifurcation but results in less than 50 % stenosis with respect to the distal vessel.  There is calcified atherosclerosis in the distal cervical left ICA just proximal to the skull base which also does not result in hemodynamically significant stenosis.  The left vertebral artery origin is obscured similar to that on the right.  The left vertebral artery is patent in the neck to the skull base.   Review of the MIP  images confirms the above findings.  IMPRESSION: 1. High-grade right ICA origin stenosis related to calcified and soft plaque, and suggestion of ulcerated plaque with focal dissection (series 8 image 77). 2.  Left carotid atherosclerosis without hemodynamically significant stenosis in the neck. 3.  Suboptimal intraarterial contrast bolus timing in the neck.  Vertebral artery origins obscured by streak artifact. 4.  See intracranial findings below. 5.  Extensive postoperative changes to the cervical spine.  CTA HEAD  Findings:  Interval paranasal sinus surgery.  Residual mucosal thickening.  Mastoids are clear. No evidence of previous craniotomy.  No suspicious calvarial osseous lesion. Visualized scalp soft tissues are within normal limits.  Stable cerebral volume.  No ventriculomegaly. No midline shift, mass effect, or evidence of mass lesion.  No acute intracranial hemorrhage identified.  No evidence of cortically based acute infarction identified.  No abnormal enhancement identified.  Vascular Findings: Major intracranial venous structures appear to be enhancing.  Suboptimal intraarterial contrast bolus timing. Diminutive distal vertebral arteries are codominant.  The vertebrobasilar junction is patent with calcified atherosclerosis. No definite basilar artery stenosis; the basilar is diminutive owing to bilateral fetal type PCA origins.  Proximal PCAs are patent.  No definite major PCA branch occlusion.  Both ICA siphons are patent with little to no calcified atherosclerosis.  Carotid termini are patent.  MCA and ACA origins are within normal limits.  The anterior communicating artery is diminutive or absent.  Visualized ACA branches are grossly normal.  Both MCA M1 segment are patent without evidence of stenosis.  The visualized bilateral MCA branches are within normal limits.   Review of the MIP images confirms the above findings.  IMPRESSION: 1.  Suboptimal contrast bolus timing.  No proximal intracranial stenosis or major circle of Willis branch occlusion. 2.  Diminutive posterior circulation on the basis of bilateral fetal PCA origins.  3. Normal for age CT appearance of the brain. 4.  Chronic paranasal sinus disease status post sinus surgery.  Original Report Authenticated By: Harley Hallmark, M.D.   Dg Chest 1 View  12/04/2011  *RADIOLOGY REPORT*  Clinical  Data: Back pain and uncontrollable shaking and weakness. History of prostate cancer.  CHEST - 1 VIEW  Comparison: Chest x-ray 08/06/2010.  Findings: Study is limited by lack of visualization of the right costophrenic sulcus.  Lung volumes are lower limits of normal.  No consolidative airspace disease.  No left pleural effusion. Pulmonary vasculature appears slightly crowded, likely related to slightly diminished lung volumes.  No evidence of pulmonary edema. No definite suspicious appearing pulmonary nodules or masses. Heart size is upper limits of normal.  Mediastinal contours are unremarkable.  Orthopedic fixation hardware is incompletely visualized in the lower cervical spine.  IMPRESSION: 1.  No radiographic evidence of acute cardiopulmonary disease.  Original Report Authenticated By: Florencia Reasons, M.D.   Ct Angio Neck W/cm &/or Wo/cm  12/04/2011  *RADIOLOGY REPORT*  Clinical Data:  66 year old male with abnormal shaking.  Pain. Flushing.  History of prostate cancer.  Prior cervical spine and brain surgery not otherwise specified.  CT ANGIOGRAPHY HEAD AND NECK  Technique:  Multidetector CT imaging of the head and neck was performed using the standard protocol during bolus administration of intravenous contrast.  Multiplanar CT image reconstructions including MIPs were obtained to evaluate the vascular anatomy. Carotid stenosis measurements (when applicable) are obtained utilizing NASCET criteria, using the distal internal carotid diameter as the denominator.  Contrast: OMNIPAQUE IOHEXOL 350 MG/ML IV SOLN 100 ml Omnipaque 350.  Comparison:  Cervical MRI 05/11/2005.  Head CT without contrast 08/05/2003.  CTA NECK  Findings:  Negative visualized lung apices.  No superior mediastinal lymphadenopathy.  Negative thyroid, larynx, pharynx, parapharyngeal spaces, retropharyngeal space, sublingual space, submandibular and parotid glands.  Postoperative changes to the globes.  Previous paranasal sinus  surgery with residual widespread mucosal thickening.  Mastoids are clear.  Previous anterior and posterior cervical fusion from C3 to T1 including multilevel cervical corpectomy.  Subsequent fusion hardware streak artifact at multiple levels.  No suspicious osseous lesion is identified.  Vascular Findings: Mild arch atherosclerosis.  Great vessel origins are within normal limits.  Suboptimal intraarterial contrast bolus timing in the neck.  Normal right common carotid artery to the level of the bifurcation. .  Soft and calcified plaque occurs at the right carotid bifurcation and involves the right ICA origin and bulb.  Focal intimal flap (series 8 image 77) through the right ICA origin. High-grade right ICA stenosis just beyond this level (image 79). The cervical right ICA remains patent to the skull base.  Right vertebral artery origin largely obscured by hardware and body habitus streak artifact.  The right vertebral artery is patent to the skull base.  Normal left common carotid artery to the carotid bifurcation. Calcified atherosclerosis occurs at the bifurcation but results in less than 50 % stenosis with respect to the distal vessel.  There is calcified atherosclerosis in the distal cervical left ICA just proximal to the skull base which also does not result in hemodynamically significant stenosis.  The left vertebral artery origin is obscured similar to that on the right.  The left vertebral artery is patent in the neck to the skull base.   Review of the MIP images confirms the above findings.  IMPRESSION: 1. High-grade right ICA origin stenosis related to calcified and soft plaque, and suggestion of ulcerated plaque with focal dissection (series 8 image 77). 2.  Left carotid atherosclerosis without hemodynamically significant stenosis in the neck. 3.  Suboptimal intraarterial contrast bolus timing in the neck. Vertebral artery origins obscured by streak artifact. 4.  See intracranial findings below. 5.   Extensive postoperative changes to the cervical spine.  CTA HEAD  Findings:  Interval paranasal sinus surgery.  Residual mucosal thickening.  Mastoids are clear. No evidence of previous craniotomy.  No suspicious calvarial osseous lesion. Visualized scalp soft tissues are within normal limits.  Stable cerebral volume.  No ventriculomegaly. No midline shift, mass effect, or evidence of mass lesion.  No acute intracranial hemorrhage identified.  No evidence of cortically based acute infarction identified.  No abnormal enhancement identified.  Vascular Findings: Major intracranial venous structures appear to be enhancing.  Suboptimal intraarterial contrast bolus timing. Diminutive distal vertebral arteries are codominant.  The vertebrobasilar junction is patent with calcified atherosclerosis. No definite basilar artery stenosis; the basilar is diminutive owing to bilateral fetal type PCA origins.  Proximal PCAs are patent.  No definite major PCA branch occlusion.  Both ICA siphons are patent with little to no calcified atherosclerosis.  Carotid termini are patent.  MCA and ACA origins are within normal limits.  The anterior communicating artery is diminutive or absent.  Visualized ACA branches are grossly normal.  Both MCA M1 segment are patent without evidence of stenosis.  The visualized bilateral MCA branches are within normal limits.   Review of the MIP images confirms the above findings.  IMPRESSION: 1.  Suboptimal contrast bolus timing.  No proximal intracranial stenosis or major circle of Willis branch occlusion.  2.  Diminutive posterior circulation on the basis of bilateral fetal PCA origins.  3. Normal for age CT appearance of the brain. 4.  Chronic paranasal sinus disease status post sinus surgery.  Original Report Authenticated By: Harley Hallmark, M.D.    Review of Systems  Constitutional: Negative for fever.  Respiratory: Negative for cough.   Cardiovascular: Negative for chest pain.    Gastrointestinal: Negative for nausea and vomiting.  Musculoskeletal: Positive for back pain.    Blood pressure 150/57, pulse 83, temperature 96.7 F (35.9 C), temperature source Axillary, resp. rate 17, height 5\' 9"  (1.753 m), weight 240 lb 8.4 oz (109.1 kg), SpO2 93.00%. Physical Exam  Constitutional: He is oriented to person, place, and time.  Cardiovascular: Normal rate, regular rhythm and normal heart sounds.   No murmur heard. Respiratory: Effort normal and breath sounds normal. He has no wheezes.  GI: Soft. Bowel sounds are normal.  Musculoskeletal: Normal range of motion.  Neurological: He is alert and oriented to person, place, and time.  Skin: Skin is warm.     Assessment/Plan [name] stenosis/possible dissection Scheduled now for cerebral arteriogram Pt aware of procedure benefits and risks and agreeable to proceed. Consent signed and in chart. WBC 16.3- pt getting decadron inj.  Marques Ericson A 12/05/2011, 7:53 AM

## 2011-12-05 NOTE — Progress Notes (Signed)
Pt. Stated that he would put CPAP on himself when ready for bed. CPAP is set up  At pt.'s bedside.

## 2011-12-05 NOTE — Progress Notes (Signed)
UR Completed.  [name] [phone] 12/05/2011

## 2011-12-05 NOTE — Progress Notes (Signed)
eLink Physician-Brief Progress Note Patient Name: [name] DOB: 1945-09-30 MRN: [id number]  Date of Service  12/05/2011   HPI/Events of Note   Bed needed for ED pt, this pt is stable  eICU Interventions  tfr to non tele bed    Intervention Category Minor Interventions: Routine modifications to care plan (e.g. PRN medications for pain, fever)  Shan Levans 12/05/2011, 5:25 PM

## 2011-12-05 NOTE — Procedures (Signed)
S/P 4 vessel cerebral arteriogram  RT CFA approach  Preliminary findings  1Approx 75 to 80 % stenosis of RT ICA prox. 2 Approx 50 % stenosis of VA origins .

## 2011-12-05 NOTE — Progress Notes (Signed)
PHARMACIST - PHYSICIAN ORDER COMMUNICATION  CONCERNING: P&T Medication Policy on Herbal Medications  DESCRIPTION:  This patient's order for:  Potassium gluconate  has been noted.  This product(s) is classified as a "natural" dietary supplement product. Due to a lack of definitive safety studies or FDA approval, nonstandard manufacturing practices, plus the potential risk of unknown drug-drug interactions while on inpatient medications, the Pharmacy and Therapeutics Committee does not permit the use of "herbal" or natural products of this type within [facility].   ACTION TAKEN: The pharmacy department is unable to verify this order at this time and your patient has been informed of this safety policy. Please reevaluate patient's clinical condition at discharge and address if the herbal or natural product(s) should be resumed at that time.  **Potassium at admission = 3.9; if potassium supplementation is required, please order KCl as appropriate.  Thank you.   Vernard Gambles, PharmD, BCPS 12/05/2011 2:00 AM

## 2011-12-05 NOTE — Progress Notes (Signed)
Placed pt on nasal CPAP auto titrate mode with 2L O2 bled in. Pt wears CPAP at home. No complications noted.

## 2011-12-05 NOTE — Progress Notes (Signed)
MEDICATION RELATED CONSULT NOTE - INITIAL   Pharmacy Consult for heparin/Rocephin/vancomycin Indication: asymptomatic carotid plaque dissection and r/o CNS infection  Allergies  Allergen Reactions  . Pregabalin Nausea Only    Patient Measurements: Height: 5\' 9"  (175.3 cm) Weight: 240 lb 8.4 oz (109.1 kg) IBW/kg (Calculated) : 70.7   Vital Signs: Temp: 104 F (40 C) (03/19 2308) Temp src: Rectal (03/19 2308) BP: 145/65 mmHg (03/20 0100) Pulse Rate: 113  (03/20 0100) Intake/Output from previous day: 03/19 0701 - 03/20 0700 In: 1000 [I.V.:1000] Out: 700 [Urine:700] Intake/Output from this shift: Total I/O In: 1000 [I.V.:1000] Out: 700 [Urine:700]  Labs:  [facility] 12/04/11 1940  WBC 10.4  HGB 13.8  HCT 40.1  PLT 156  APTT --  CREATININE 1.19  LABCREA --  CREATININE 1.19  CREAT24HRUR --  MG --  PHOS --  ALBUMIN 4.5  PROT 8.0  ALBUMIN 4.5  AST 27  ALT 28  ALKPHOS 79  BILITOT 0.3  BILIDIR <0.1  IBILI NOT CALCULATED   Estimated Creatinine Clearance: 74.4 ml/min (by C-G formula based on Cr of 1.19).   Microbiology: Recent Results (from the past 720 hour(s))  CULTURE, BLOOD (ROUTINE X 2)     Status: Normal (Preliminary result)   Collection Time   12/04/11  8:44 PM      Component Value Range Status Comment   Specimen Description BLOOD LEFT ARM   Final    Special Requests BOTTLES DRAWN AEROBIC AND ANAEROBIC 3CC EACH   Final    Culture PENDING   Incomplete    Report Status PENDING   Incomplete   CULTURE, BLOOD (ROUTINE X 2)     Status: Normal (Preliminary result)   Collection Time   12/04/11  8:44 PM      Component Value Range Status Comment   Specimen Description BLOOD RIGHT ANTECUBITAL   Final    Special Requests BOTTLES DRAWN AEROBIC AND ANAEROBIC 4CC EACH   Final    Culture PENDING   Incomplete    Report Status PENDING   Incomplete     Medical History: Past Medical History  Diagnosis Date  . Hyperlipidemia   . Sleep apnea   . Personal history of  malignant neoplasm of prostate   . Malignant neoplasm of bladder, part unspecified   . Peripheral vascular disease, unspecified   . Personal history of other diseases of digestive system   . Osteoarthrosis, unspecified whether generalized or localized, unspecified site   . Other constipation   . Diabetes mellitus   . Hypertension   . Gouty arthritis   . Prolapsed cervical disc   . Carotid stenosis, right     severe, asymptomatic  . OSA on CPAP   . Obesity   . Cellulitis Apr 25, 2011    right leg    Medications:  Prescriptions prior to admission  Medication Sig Dispense Refill  . allopurinol (ZYLOPRIM) 300 MG tablet Take 300 mg by mouth at bedtime.       Marland Kitchen amLODipine (NORVASC) 5 MG tablet Take 1 tablet (5 mg total) by mouth daily.  30 tablet  6  . aspirin 81 MG EC tablet Take 162 mg by mouth at bedtime.       . cholecalciferol (VITAMIN D) 1000 UNITS tablet Take 1,000 Units by mouth daily.      . diclofenac (VOLTAREN) 75 MG EC tablet Take 75 mg by mouth 2 (two) times daily. For 1 to 2 weeks routinely      . docusate sodium (  COLACE) 100 MG capsule Take 200 mg by mouth at bedtime.      . furosemide (LASIX) 40 MG tablet Take 40 mg by mouth daily. 1 tablet po prn for SOB or swelling       . gabapentin (NEURONTIN) 300 MG capsule Take 300 mg by mouth 3 (three) times daily.        Marland Kitchen glimepiride (AMARYL) 2 MG tablet Take 2 mg by mouth 2 (two) times daily.        . insulin detemir (LEVEMIR) 100 UNIT/ML injection Inject 8 Units into the skin at bedtime.       Marland Kitchen labetalol (NORMODYNE) 300 MG tablet Take 300 mg by mouth 2 (two) times daily.        Marland Kitchen losartan (COZAAR) 100 MG tablet Take 1 tablet (100 mg total) by mouth daily.  30 tablet  6  . niacin (NIASPAN) 1000 MG CR tablet Take 2,000 mg by mouth at bedtime.       Marland Kitchen omega-3 acid ethyl esters (LOVAZA) 1 G capsule Take 1 g by mouth 2 (two) times daily.       . Potassium Gluconate 595 MG CAPS Take 1 capsule by mouth daily.      . rosuvastatin  (CRESTOR) 5 MG tablet Take 5 mg by mouth daily.        . sitaGLIPtan (JANUVIA) 100 MG tablet Take 100 mg by mouth daily.        . Tamsulosin HCl (FLOMAX) 0.4 MG CAPS Take 0.4 mg by mouth at bedtime.      Marland Kitchen acetaminophen (TYLENOL) 500 MG tablet Take 500 mg by mouth every 6 (six) hours as needed.        . colchicine 0.6 MG tablet Take 0.6 mg by mouth daily as needed.        Marland Kitchen HYDROcodone-acetaminophen (NORCO) 5-325 MG per tablet Take 1 tablet by mouth every 6 (six) hours as needed. For pain       Scheduled:    . acetaminophen  1,000 mg Oral Once  . allopurinol  300 mg Oral QHS  . amLODipine  5 mg Oral Daily  . aspirin EC  162 mg Oral QHS  . atorvastatin  10 mg Oral q1800  . cefTRIAXone (ROCEPHIN)  IV  1 g Intravenous Once  . cholecalciferol  1,000 Units Oral Daily  . dexamethasone  10 mg Intravenous Once  . diazepam  5 mg Oral Once  . diclofenac  75 mg Oral BID  . docusate sodium  200 mg Oral QHS  . furosemide  40 mg Oral Daily  . gabapentin  300 mg Oral TID  . heparin  16 Units/kg/hr (Order-Specific) Intravenous Once  . heparin  4,000 Units Intravenous Once  .  HYDROmorphone (DILAUDID) injection  1 mg Intravenous Once  .  HYDROmorphone (DILAUDID) injection  1 mg Intravenous Once  . ibuprofen  800 mg Oral Once  . labetalol  300 mg Oral BID  . LORazepam  1 mg Intravenous Once  . LORazepam  1 mg Intravenous Once  . losartan  100 mg Oral Daily  . metoprolol  5 mg Intravenous Once  . niacin  2,000 mg Oral QHS  . omega-3 acid ethyl esters  1 g Oral BID WC  . ondansetron  4 mg Intravenous Once  . sodium chloride  1,000 mL Intravenous Once  . sodium chloride  1,000 mL Intravenous Once  . Tamsulosin HCl  0.4 mg Oral QHS  . vancomycin  1,000 mg Intravenous  Once  . DISCONTD: LORazepam  1 mg Intravenous Once  . DISCONTD: Potassium Gluconate  1 capsule Oral Daily    Assessment: 66yo male c/o sudden severe low back pain radiating to legs bilaterally associated with shakiness and  weakness, to begin IV ABX for suspected CNS infection, neurology will be consulted and currently admitted to neuro ICU.  In addition, [name] dissection was found incidentally and not thought to be contributing to current Sx, to begin heparin to prevent CVA.  Goal of Therapy:  Vancomycin trough 15-20 and heparin level 0.3-0.5  Plan:  Rec'd Rocephin 1g at 2200 and vancomycin 1g at ~2230 at [facility].  Will give additional vancomycin 1500mg  x1 now to complete load then begin vanc 1g IV Q12H.  Will begin Rocephin 2g IV Q12H and adjust down if meningitis r/o.  Will begin heparin gtt conservatively at 1100 units/hr without bolus and adjust per heparin levels and increase goal level when stable and appropriate.  Monitor CBC, Cx, levels prn.  Colleen Can PharmD BCPS 12/05/2011,2:15 AM

## 2011-12-05 NOTE — Progress Notes (Signed)
HPI:  66 yo who presented to [facility] with severe low back pain radiating to legs bilaterally, shaking and weakness, which started suddenly without specific precipitating factors. Incidental finding was [name] dissection flap. Neurology was consulted. Patient was transferred to [facility] for further management.  Antibiotics:   3/19 Ceftriaxone  3/19 Vancomycin  Cultures/Sepsis Markers:   3/19 Blood  Access/Protocols:  None  Tests / Events:  3/19 CT abdomen >>> No acute findings  3/19 CT angio head / neck >>> High-grade right ICA origin stenosis related to calcified and soft plaque, and suggestion of ulcerated plaque with focal dissection (series 8 image 77). Left carotid atherosclerosis without hemodynamically significant stenosis in the neck. Suboptimal intraarterial contrast bolus timing in the neck. Vertebral artery origins obscured by streak artifact. Extensive postoperative changes to the cervical spine. Suboptimal contrast bolus timing. No proximal intracranial stenosis or major circle of Willis branch occlusion. Diminutive posterior circulation on the basis of bilateral fetal PCA origins. Normal for age CT appearance of the brain. Chronic paranasal sinus disease status post sinus surgery.  Best Practice: DVT: Heparin drip GI: Protonix  Subjective: No events overnight.  Physical Exam: Filed Vitals:   12/05/11 0900  BP: 142/65  Pulse: 80  Temp:   Resp: 14    Intake/Output Summary (Last 24 hours) at 12/05/11 1022 Last data filed at 12/05/11 0900  Gross per 24 hour  Intake   1800 ml  Output   1550 ml  Net    250 ml   Neuro: Alert and oriented, moving all ext spontaneously. Cardiac: RRR, Nl S1/S2, -M/R/G. Pulmonary: CTA bilaterally. GI: Soft, NT, ND and +BS. Extremities: -edema and -tenderness.  Labs: CBC    Component Value Date/Time   WBC 16.3* 12/05/2011 0400   RBC 4.08* 12/05/2011 0400   HGB 12.7* 12/05/2011 0400   HCT 36.9* 12/05/2011 0400   PLT 127* 12/05/2011 0400   MCV 90.4  12/05/2011 0400   MCH 31.1 12/05/2011 0400   MCHC 34.4 12/05/2011 0400   RDW 13.2 12/05/2011 0400   LYMPHSABS 1.2 12/04/2011 1940   MONOABS 1.2* 12/04/2011 1940   EOSABS 0.4 12/04/2011 1940   BASOSABS 0.0 12/04/2011 1940   BMET    Component Value Date/Time   NA 140 12/04/2011 1940   K 3.9 12/04/2011 1940   CL 100 12/04/2011 1940   CO2 29 12/04/2011 1940   GLUCOSE 120* 12/04/2011 1940   BUN 17 12/04/2011 1940   CREATININE 1.19 12/04/2011 1940   CALCIUM 10.2 12/04/2011 1940   GFRNONAA 62* 12/04/2011 1940   GFRAA 72* 12/04/2011 1940   ABG No results found for this basename: phart, pco2, pco2art, po2, po2art, hco3, tco2, acidbasedef, o2sat    No results found for this basename: MG in the last 168 hours Lab Results  Component Value Date   CALCIUM 10.2 12/04/2011    Chest Xray:   Assessment & Plan: NEUROLOGIC  A: Asymptomatic carotid plaque dissection / carotid stenosis - likely incidental finding - Neurology consulted (Dr. Roseanne Reno). History of CSF leak repaired at [facility] one year ago - no current signs of CSF leak - discussed with Dr. Baldomero Lamy (ENT, [facility]). Severe low back pain on the background of radiculopathy. Reportedly encephalopathic upon initial presentation, now appropriate.  P:  --> Per Neurology, IR to do an angiogram today.  PULMONARY  No results found for this basename: PHART:5,PCO2:5,PCO2ART:5,PO2ART:5,HCO3:5,O2SAT:5 in the last 168 hours  A: No acute issues. History of obstructive sleep apnea.  P:  --> Supplemental oxygen to  maintain SpO2>92%  --> CPAP per RT   CARDIOVASCULAR  Lab  12/04/11 2125  12/04/11 1940   TROPONINI  --  <0.30   LATICACIDVEN  1.5  --   PROBNP  --  --   A: Hemodynamically stable. No arrhythmia / ischemia. Initially hypertensive on the background of severe back pain, now normotensive. History of hypertension / PVD / Dyslipidemia.  P:  --> Continue preadmission medications.   RENAL  Lab  12/04/11 1940   NA  140   K  3.9   CL  100   CO2  29     BUN  17   CREATININE  1.19   CALCIUM  10.2   MG  --   PHOS  --   A: Normal renal function and electrolytes.  P:  --> BMP, Mg, Phos in AM   GASTROINTESTINAL  A: No active issues.  P: No interventions required   HEMATOLOGIC  A: No active issues.  P: CBC in AM   INFECTIOUS  A: No overt signs of infection. Reportedly febrile in ED.  P:  --> Blood cultures / PCT  --> Empirical Vancomycin / Ceftriaxone   ENDOCRINE  A: Diabetes, Hyperglycemia.  P:  --> CBG checks / SSI  --> Hold oral agents for now as NPO  Koren Bound, MD [phone]

## 2011-12-05 NOTE — Consult Note (Signed)
Vascular Surgery Consultation  Reason for Consult: severe right ICA stenosis  HPI: [name] is a 66 y.o. male who presents for evaluation of shaking spell and both upper extremities. Yesterday patient developed shaking spell in both upper extremities. He had no syncope. He had no achalasia, amaurosis fugax, lateralizing weakness. He has no history of stroke or TIAs. He has a known moderate right ICA stenosis which has been followed by his cardiologist. He has been signed by Dr. Madilyn Fireman in our practice for 3-4 years ago.. CT angiogram done during this admission suggests an approximate 80% right ICA stenosis.   Past Medical History  Diagnosis Date  . Hyperlipidemia   . Sleep apnea   . Personal history of malignant neoplasm of prostate   . Malignant neoplasm of bladder, part unspecified   . Peripheral vascular disease, unspecified   . Personal history of other diseases of digestive system   . Osteoarthrosis, unspecified whether generalized or localized, unspecified site   . Other constipation   . Diabetes mellitus   . Hypertension   . Gouty arthritis   . Prolapsed cervical disc   . Carotid stenosis, right     severe, asymptomatic  . OSA on CPAP   . Obesity   . Cellulitis Apr 25, 2011    right leg   Past Surgical History  Procedure Date  . Cervical fusion   . Appendectomy   . Brain surgery 2011  . Septoplasty   . Cataract extraction, bilateral   . Colonoscopy 04/2011   History   Social History  . Marital Status: Married    Spouse Name: N/A    Number of Children: N/A  . Years of Education: N/A   Social History Main Topics  . Smoking status: Former Smoker -- 1.0 packs/day    Types: Cigarettes  . Smokeless tobacco: Never Used  . Alcohol Use: No  . Drug Use: No  . Sexually Active: None   Other Topics Concern  . None   Social History Narrative  . None   Family History  Problem Relation Age of Onset  . Cancer Other     Family hx  . Diabetes Other     family  hx  . Arthritis Other     family hx  . Cancer Mother   . Heart disease Father     MI  . Colon cancer Brother    Allergies  Allergen Reactions  . Pregabalin Nausea Only   Prior to Admission medications   Medication Sig Start Date End Date Taking? Authorizing Provider  allopurinol (ZYLOPRIM) 300 MG tablet Take 300 mg by mouth at bedtime.    Yes Historical Provider, MD  amLODipine (NORVASC) 5 MG tablet Take 1 tablet (5 mg total) by mouth daily. 09/04/11 09/03/12 Yes Antonieta Iba, MD  aspirin 81 MG EC tablet Take 162 mg by mouth at bedtime.    Yes Historical Provider, MD  cholecalciferol (VITAMIN D) 1000 UNITS tablet Take 1,000 Units by mouth daily.   Yes Historical Provider, MD  diclofenac (VOLTAREN) 75 MG EC tablet Take 75 mg by mouth 2 (two) times daily. For 1 to 2 weeks routinely   Yes Historical Provider, MD  docusate sodium (COLACE) 100 MG capsule Take 200 mg by mouth at bedtime.   Yes Historical Provider, MD  furosemide (LASIX) 40 MG tablet Take 40 mg by mouth daily. 1 tablet po prn for SOB or swelling    Yes Historical Provider, MD  gabapentin (NEURONTIN) 300 MG  capsule Take 300 mg by mouth 3 (three) times daily.     Yes Historical Provider, MD  glimepiride (AMARYL) 2 MG tablet Take 2 mg by mouth 2 (two) times daily.     Yes Historical Provider, MD  insulin detemir (LEVEMIR) 100 UNIT/ML injection Inject 8 Units into the skin at bedtime.    Yes Historical Provider, MD  labetalol (NORMODYNE) 300 MG tablet Take 300 mg by mouth 2 (two) times daily.     Yes Historical Provider, MD  losartan (COZAAR) 100 MG tablet Take 1 tablet (100 mg total) by mouth daily. 09/04/11 09/03/12 Yes Antonieta Iba, MD  niacin (NIASPAN) 1000 MG CR tablet Take 2,000 mg by mouth at bedtime.    Yes Historical Provider, MD  omega-3 acid ethyl esters (LOVAZA) 1 G capsule Take 1 g by mouth 2 (two) times daily.    Yes Historical Provider, MD  Potassium Gluconate 595 MG CAPS Take 1 capsule by mouth daily.   Yes  Historical Provider, MD  rosuvastatin (CRESTOR) 5 MG tablet Take 5 mg by mouth daily.     Yes Historical Provider, MD  sitaGLIPtan (JANUVIA) 100 MG tablet Take 100 mg by mouth daily.     Yes Historical Provider, MD  Tamsulosin HCl (FLOMAX) 0.4 MG CAPS Take 0.4 mg by mouth at bedtime.   Yes Historical Provider, MD  acetaminophen (TYLENOL) 500 MG tablet Take 500 mg by mouth every 6 (six) hours as needed.      Historical Provider, MD  colchicine 0.6 MG tablet Take 0.6 mg by mouth daily as needed.      Historical Provider, MD  HYDROcodone-acetaminophen (NORCO) 5-325 MG per tablet Take 1 tablet by mouth every 6 (six) hours as needed. For pain    Historical Provider, MD     Positive ROS: severe cervical disc disease with 3 previous operative procedures, denies any chest pain, dyspnea on exertion, PND, orthopnea. Unable to and a light one block because of lower stranded discomfort.  All other systems have been reviewed and were otherwise negative with the exception of those mentioned in the HPI and as above.  Physical Exam: Filed Vitals:   12/05/11 1700  BP: 137/61  Pulse: 80  Temp:   Resp: 16    General: Alert, no acute distress HEENT: Normal for age Cardiovascular: Regular rate and rhythm. Carotid pulses 2+, no bruits audible Respiratory: Clear to auscultation. No cyanosis, no use of accessory musculature GI: No organomegaly, abdomen is soft and non-tender Skin: No lesions in the area of chief complaint Neurologic: Sensation intact distally Psychiatric: Patient is competent for consent with normal mood and affect Musculoskeletal: No obvious deformities Extremities: plus femoral popliteal and dorsalis pedis pulses palpable bilaterally area both feet well perfused.    Imaging reviewed: I reviewed CT angiogram and agree that he does have severe right internal carotid stenosis Would like to better quantitate this with duplex scan of his carotid make appropriate  recommendations    Assessment/Plan:  Asymptomatic severe right internal carotid stenosis-incidental finding We'll schedule carotid duplex exam to help quantitate how severe stenosis is to make appropriate recommendations   Josephina Gip, MD 12/05/2011 5:49 PM

## 2011-12-05 NOTE — Consult Note (Signed)
[facility] CONSULT NOTE     Reason for Consult: Severe right carotid stenosis with possible dissection.    HPI:    [name] is an 66 y.o. male with a history of known right carotid stenosis who presented to the emergency room [facility] yesterday with complaint of severe back pain. Patient was also noted to be markedly hypertensive. CT scan of his abdomen and pelvis was unremarkable. Of his head and neck showed a high-grade right internal carotid artery stenosis related to calcified and soft plaque suggestion of ulcerated plaque and focal dissection. Anticoagulation with heparin drip was recommended as well as further evaluation with interventional radiology with catheter angiogram. Hypertension was controlled as well as back pain. No previous history of stroke nor TIA.  Past Medical History  Diagnosis Date  . Hyperlipidemia   . Sleep apnea   . Personal history of malignant neoplasm of prostate   . Malignant neoplasm of bladder, part unspecified   . Peripheral vascular disease, unspecified   . Personal history of other diseases of digestive system   . Osteoarthrosis, unspecified whether generalized or localized, unspecified site   . Other constipation   . Diabetes mellitus   . Hypertension   . Gouty arthritis   . Prolapsed cervical disc   . Carotid stenosis, right     severe, asymptomatic  . OSA on CPAP   . Obesity   . Cellulitis Apr 25, 2011    right leg    Past Surgical History  Procedure Date  . Cervical fusion   . Appendectomy   . Brain surgery 2011  . Septoplasty   . Cataract extraction, bilateral   . Colonoscopy 04/2011    Family History  Problem Relation Age of Onset  . Cancer Other     Family hx  . Diabetes Other     family hx  . Arthritis Other     family hx  . Cancer Mother   . Heart disease Father     MI  . Colon cancer Brother     Social History:  reports that he has quit smoking. His smoking use  included Cigarettes. He smoked 1 pack per day. He has never used smokeless tobacco. He reports that he does not drink alcohol or use illicit drugs.  Allergies  Allergen Reactions  . Pregabalin Nausea Only    Medications:    Scheduled:   . acetaminophen  1,000 mg Oral Once  . allopurinol  300 mg Oral QHS  . amLODipine  5 mg Oral Daily  . aspirin EC  162 mg Oral QHS  . atorvastatin  10 mg Oral q1800  . cefTRIAXone (ROCEPHIN)  IV  1 g Intravenous Once  . cefTRIAXone (ROCEPHIN)  IV  2 g Intravenous Q12H  . cholecalciferol  1,000 Units Oral Daily  . dexamethasone  10 mg Intravenous Once  . diazepam  5 mg Oral Once  . diclofenac  75 mg Oral BID  . docusate sodium  200 mg Oral QHS  . furosemide  40 mg Oral Daily  . gabapentin  300 mg Oral TID  . heparin  16 Units/kg/hr (Order-Specific) Intravenous Once  . heparin  4,000 Units Intravenous Once  .  HYDROmorphone (DILAUDID) injection  1 mg Intravenous Once  .  HYDROmorphone (DILAUDID) injection  1 mg Intravenous Once  . ibuprofen  800 mg Oral  Once  . labetalol  300 mg Oral BID  . LORazepam  1 mg Intravenous Once  . LORazepam  1 mg Intravenous Once  . losartan  100 mg Oral Daily  . metoprolol  5 mg Intravenous Once  . niacin  2,000 mg Oral QHS  . omega-3 acid ethyl esters  1 g Oral BID WC  . ondansetron  4 mg Intravenous Once  . sodium chloride  1,000 mL Intravenous Once  . sodium chloride  1,000 mL Intravenous Once  . Tamsulosin HCl  0.4 mg Oral QHS  . vancomycin  1,500 mg Intravenous Once  . vancomycin  1,000 mg Intravenous Once  . vancomycin  1,000 mg Intravenous Q12H  . DISCONTD: LORazepam  1 mg Intravenous Once  . DISCONTD: Potassium Gluconate  1 capsule Oral Daily    Blood pressure 117/55, pulse 78, temperature 96.7 F (35.9 C), temperature source Axillary, resp. rate 16, height 5\' 9"  (1.753 m), weight 109.1 kg (240 lb 8.4 oz), SpO2 96.00%.   Neurologic Examination:  Mental Status: Alert, oriented, thought content  appropriate.  Speech fluent without evidence of aphasia. Able to follow commands without difficulty. Cranial Nerves: II-Visual fields were normal. III/IV/VI-Extraocular movements intact.  Pupils reacted normally to light. V/VII-no facial numbness and no facial weakness VIII-grossly intact X-normal speech XII-midline tongue extension Motor: 5/5 bilaterally with normal tone and bulk Sensory: light touch intact throughout, bilaterally Deep Tendon Reflexes: 1+ and symmetric throughout Plantars: Downgoing bilaterally Cerebellar: Normal finger-to-nose Carotid auscultation: Normal  No results found for this basename: cbc, bmp, coags, chol, tri, ldl, hga1c    Results for orders placed during the hospital encounter of 12/04/11 (from the past 48 hour(s))  CBC     Status: Normal   Collection Time   12/04/11  7:40 PM      Component Value Range Comment   WBC 10.4  4.0 - 10.5 (K/uL)    RBC 4.36  4.22 - 5.81 (MIL/uL)    Hemoglobin 13.8  13.0 - 17.0 (g/dL)    HCT 16.1  09.6 - 04.5 (%)    MCV 92.0  78.0 - 100.0 (fL)    MCH 31.7  26.0 - 34.0 (pg)    MCHC 34.4  30.0 - 36.0 (g/dL)    RDW 40.9  81.1 - 91.4 (%)    Platelets 156  150 - 400 (K/uL)   DIFFERENTIAL     Status: Abnormal   Collection Time   12/04/11  7:40 PM      Component Value Range Comment   Neutrophils Relative 74  43 - 77 (%)    Neutro Abs 7.7  1.7 - 7.7 (K/uL)    Lymphocytes Relative 11 (*) 12 - 46 (%)    Lymphs Abs 1.2  0.7 - 4.0 (K/uL)    Monocytes Relative 12  3 - 12 (%)    Monocytes Absolute 1.2 (*) 0.1 - 1.0 (K/uL)    Eosinophils Relative 3  0 - 5 (%)    Eosinophils Absolute 0.4  0.0 - 0.7 (K/uL)    Basophils Relative 0  0 - 1 (%)    Basophils Absolute 0.0  0.0 - 0.1 (K/uL)   BASIC METABOLIC PANEL     Status: Abnormal   Collection Time   12/04/11  7:40 PM      Component Value Range Comment   Sodium 140  135 - 145 (mEq/L)    Potassium 3.9  3.5 - 5.1 (mEq/L)    Chloride 100  96 - 112 (  mEq/L)    CO2 29  19 - 32 (mEq/L)      Glucose, Bld 120 (*) 70 - 99 (mg/dL)    BUN 17  6 - 23 (mg/dL)    Creatinine, Ser 1.61  0.50 - 1.35 (mg/dL)    Calcium 09.6  8.4 - 10.5 (mg/dL)    GFR calc non Af Amer 62 (*) >90 (mL/min)    GFR calc Af Amer 72 (*) >90 (mL/min)   TROPONIN I     Status: Normal   Collection Time   12/04/11  7:40 PM      Component Value Range Comment   Troponin I <0.30  <0.30 (ng/mL)   LIPASE, BLOOD     Status: Normal   Collection Time   12/04/11  7:40 PM      Component Value Range Comment   Lipase 38  11 - 59 (U/L)   HEPATIC FUNCTION PANEL     Status: Normal   Collection Time   12/04/11  7:40 PM      Component Value Range Comment   Total Protein 8.0  6.0 - 8.3 (g/dL)    Albumin 4.5  3.5 - 5.2 (g/dL)    AST 27  0 - 37 (U/L)    ALT 28  0 - 53 (U/L)    Alkaline Phosphatase 79  39 - 117 (U/L)    Total Bilirubin 0.3  0.3 - 1.2 (mg/dL)    Bilirubin, Direct <0.4  0.0 - 0.3 (mg/dL)    Indirect Bilirubin NOT CALCULATED  0.3 - 0.9 (mg/dL)   CULTURE, BLOOD (ROUTINE X 2)     Status: Normal (Preliminary result)   Collection Time   12/04/11  8:44 PM      Component Value Range Comment   Specimen Description BLOOD LEFT ARM      Special Requests BOTTLES DRAWN AEROBIC AND ANAEROBIC 3CC EACH      Culture PENDING      Report Status PENDING     CULTURE, BLOOD (ROUTINE X 2)     Status: Normal (Preliminary result)   Collection Time   12/04/11  8:44 PM      Component Value Range Comment   Specimen Description BLOOD RIGHT ANTECUBITAL      Special Requests BOTTLES DRAWN AEROBIC AND ANAEROBIC 4CC EACH      Culture PENDING      Report Status PENDING     LACTIC ACID, PLASMA     Status: Normal   Collection Time   12/04/11  9:25 PM      Component Value Range Comment   Lactic Acid, Venous 1.5  0.5 - 2.2 (mmol/L)   URINALYSIS, ROUTINE W REFLEX MICROSCOPIC     Status: Abnormal   Collection Time   12/04/11 10:38 PM      Component Value Range Comment   Color, Urine STRAW (*) YELLOW     APPearance CLEAR  CLEAR      Specific Gravity, Urine <1.005 (*) 1.005 - 1.030     pH 6.0  5.0 - 8.0     Glucose, UA NEGATIVE  NEGATIVE (mg/dL)    Hgb urine dipstick SMALL (*) NEGATIVE     Bilirubin Urine NEGATIVE  NEGATIVE     Ketones, ur NEGATIVE  NEGATIVE (mg/dL)    Protein, ur NEGATIVE  NEGATIVE (mg/dL)    Urobilinogen, UA 0.2  0.0 - 1.0 (mg/dL)    Nitrite NEGATIVE  NEGATIVE     Leukocytes, UA NEGATIVE  NEGATIVE    URINE  MICROSCOPIC-ADD ON     Status: Normal   Collection Time   12/04/11 10:38 PM      Component Value Range Comment   Squamous Epithelial / LPF RARE  RARE     WBC, UA 0-2  <3 (WBC/hpf)    RBC / HPF 0-2  <3 (RBC/hpf)    Bacteria, UA RARE  RARE    GLUCOSE, CAPILLARY     Status: Abnormal   Collection Time   12/04/11 11:34 PM      Component Value Range Comment   Glucose-Capillary 177 (*) 70 - 99 (mg/dL)   MRSA PCR SCREENING     Status: Normal   Collection Time   12/05/11  1:02 AM      Component Value Range Comment   MRSA by PCR NEGATIVE  NEGATIVE    HEPARIN LEVEL (UNFRACTIONATED)     Status: Abnormal   Collection Time   12/05/11  4:00 AM      Component Value Range Comment   Heparin Unfractionated 0.24 (*) 0.30 - 0.70 (IU/mL)   CBC     Status: Abnormal   Collection Time   12/05/11  4:00 AM      Component Value Range Comment   WBC 16.3 (*) 4.0 - 10.5 (K/uL)    RBC 4.08 (*) 4.22 - 5.81 (MIL/uL)    Hemoglobin 12.7 (*) 13.0 - 17.0 (g/dL)    HCT 96.2 (*) 95.2 - 52.0 (%)    MCV 90.4  78.0 - 100.0 (fL)    MCH 31.1  26.0 - 34.0 (pg)    MCHC 34.4  30.0 - 36.0 (g/dL)    RDW 84.1  32.4 - 40.1 (%)    Platelets 127 (*) 150 - 400 (K/uL)   PROTIME-INR     Status: Normal   Collection Time   12/05/11  4:00 AM      Component Value Range Comment   Prothrombin Time 14.1  11.6 - 15.2 (seconds)    INR 1.07  0.00 - 1.49      Ct Abdomen Pelvis Wo Contrast  12/04/2011  *RADIOLOGY REPORT*  Clinical Data: Severe back and leg pain  CT ABDOMEN AND PELVIS WITHOUT CONTRAST  Technique:  Multidetector CT imaging of the  abdomen and pelvis was performed following the standard protocol without intravenous contrast.  Comparison: 11/13/2007  Findings: The lung bases are clear.  No pericardial or pleural effusion identified.  Within the limitations of noncontrast technique the liver parenchyma appears intact.  Gallbladder is negative.  There is no biliary dilatation.  The pancreas appears normal.  Normal appearance of both adrenal glands.  The spleen appears normal.  Mild, symmetric perinephric fat stranding is identified and appears unchanged from previous exam.  Low density structures within both kidneys are again noted, compatible with cysts.  These are incompletely characterized on today's exam due to lack of IV contrast.  No upper abdominal adenopathy identified.  There is no pelvic or inguinal adenopathy.  Seed implants identified within the prostate gland.  The urinary bladder appears normal.  The stomach and the small bowel loops are normal.  The proximal colon is normal.  There are multiple diverticula arising from the left colon and sigmoid colon without evidence for acute inflammation.  No free fluid or abnormal fluid collections within the abdomen or pelvis.  Normal caliber of the abdominal aorta.  No aneurysm.  Review of the visualized osseous structures shows evidence of multilevel lumbar degenerative disc disease.  There is a first- degree anterolisthesis of  L4 on L5.  IMPRESSION:  1.  No acute findings identified within the abdomen or pelvis.  Original Report Authenticated By: Rosealee Albee, M.D.   Ct Angio Head W/cm &/or Wo Cm  12/04/2011  *RADIOLOGY REPORT*  Clinical Data:  66 year old male with abnormal shaking.  Pain. Flushing.  History of prostate cancer.  Prior cervical spine and brain surgery not otherwise specified.  CT ANGIOGRAPHY HEAD AND NECK  Technique:  Multidetector CT imaging of the head and neck was performed using the standard protocol during bolus administration of intravenous contrast.   Multiplanar CT image reconstructions including MIPs were obtained to evaluate the vascular anatomy. Carotid stenosis measurements (when applicable) are obtained utilizing NASCET criteria, using the distal internal carotid diameter as the denominator.  Contrast: OMNIPAQUE IOHEXOL 350 MG/ML IV SOLN 100 ml Omnipaque 350.  Comparison:  Cervical MRI 05/11/2005.  Head CT without contrast 08/05/2003.  CTA NECK  Findings:  Negative visualized lung apices.  No superior mediastinal lymphadenopathy.  Negative thyroid, larynx, pharynx, parapharyngeal spaces, retropharyngeal space, sublingual space, submandibular and parotid glands.  Postoperative changes to the globes.  Previous paranasal sinus surgery with residual widespread mucosal thickening.  Mastoids are clear.  Previous anterior and posterior cervical fusion from C3 to T1 including multilevel cervical corpectomy.  Subsequent fusion hardware streak artifact at multiple levels.  No suspicious osseous lesion is identified.  Vascular Findings: Mild arch atherosclerosis.  Great vessel origins are within normal limits.  Suboptimal intraarterial contrast bolus timing in the neck.  Normal right common carotid artery to the level of the bifurcation. .  Soft and calcified plaque occurs at the right carotid bifurcation and involves the right ICA origin and bulb.  Focal intimal flap (series 8 image 77) through the right ICA origin. High-grade right ICA stenosis just beyond this level (image 79). The cervical right ICA remains patent to the skull base.  Right vertebral artery origin largely obscured by hardware and body habitus streak artifact.  The right vertebral artery is patent to the skull base.  Normal left common carotid artery to the carotid bifurcation. Calcified atherosclerosis occurs at the bifurcation but results in less than 50 % stenosis with respect to the distal vessel.  There is calcified atherosclerosis in the distal cervical left ICA just proximal to the  skull base which also does not result in hemodynamically significant stenosis.  The left vertebral artery origin is obscured similar to that on the right.  The left vertebral artery is patent in the neck to the skull base.   Review of the MIP images confirms the above findings.  IMPRESSION: 1. High-grade right ICA origin stenosis related to calcified and soft plaque, and suggestion of ulcerated plaque with focal dissection (series 8 image 77). 2.  Left carotid atherosclerosis without hemodynamically significant stenosis in the neck. 3.  Suboptimal intraarterial contrast bolus timing in the neck. Vertebral artery origins obscured by streak artifact. 4.  See intracranial findings below. 5.  Extensive postoperative changes to the cervical spine.  CTA HEAD  Findings:  Interval paranasal sinus surgery.  Residual mucosal thickening.  Mastoids are clear. No evidence of previous craniotomy.  No suspicious calvarial osseous lesion. Visualized scalp soft tissues are within normal limits.  Stable cerebral volume.  No ventriculomegaly. No midline shift, mass effect, or evidence of mass lesion.  No acute intracranial hemorrhage identified.  No evidence of cortically based acute infarction identified.  No abnormal enhancement identified.  Vascular Findings: Major intracranial venous structures appear to be enhancing.  Suboptimal intraarterial contrast bolus timing. Diminutive distal vertebral arteries are codominant.  The vertebrobasilar junction is patent with calcified atherosclerosis. No definite basilar artery stenosis; the basilar is diminutive owing to bilateral fetal type PCA origins.  Proximal PCAs are patent.  No definite major PCA branch occlusion.  Both ICA siphons are patent with little to no calcified atherosclerosis.  Carotid termini are patent.  MCA and ACA origins are within normal limits.  The anterior communicating artery is diminutive or absent.  Visualized ACA branches are grossly normal.  Both MCA M1 segment  are patent without evidence of stenosis.  The visualized bilateral MCA branches are within normal limits.   Review of the MIP images confirms the above findings.  IMPRESSION: 1.  Suboptimal contrast bolus timing.  No proximal intracranial stenosis or major circle of Willis branch occlusion. 2.  Diminutive posterior circulation on the basis of bilateral fetal PCA origins.  3. Normal for age CT appearance of the brain. 4.  Chronic paranasal sinus disease status post sinus surgery.  Original Report Authenticated By: Harley Hallmark, M.D.   Dg Chest 1 View  12/04/2011  *RADIOLOGY REPORT*  Clinical Data: Back pain and uncontrollable shaking and weakness. History of prostate cancer.  CHEST - 1 VIEW  Comparison: Chest x-ray 08/06/2010.  Findings: Study is limited by lack of visualization of the right costophrenic sulcus.  Lung volumes are lower limits of normal.  No consolidative airspace disease.  No left pleural effusion. Pulmonary vasculature appears slightly crowded, likely related to slightly diminished lung volumes.  No evidence of pulmonary edema. No definite suspicious appearing pulmonary nodules or masses. Heart size is upper limits of normal.  Mediastinal contours are unremarkable.  Orthopedic fixation hardware is incompletely visualized in the lower cervical spine.  IMPRESSION: 1.  No radiographic evidence of acute cardiopulmonary disease.  Original Report Authenticated By: Florencia Reasons, M.D.   Ct Angio Neck W/cm &/or Wo/cm  12/04/2011  *RADIOLOGY REPORT*  Clinical Data:  66 year old male with abnormal shaking.  Pain. Flushing.  History of prostate cancer.  Prior cervical spine and brain surgery not otherwise specified.  CT ANGIOGRAPHY HEAD AND NECK  Technique:  Multidetector CT imaging of the head and neck was performed using the standard protocol during bolus administration of intravenous contrast.  Multiplanar CT image reconstructions including MIPs were obtained to evaluate the vascular anatomy.  Carotid stenosis measurements (when applicable) are obtained utilizing NASCET criteria, using the distal internal carotid diameter as the denominator.  Contrast: OMNIPAQUE IOHEXOL 350 MG/ML IV SOLN 100 ml Omnipaque 350.  Comparison:  Cervical MRI 05/11/2005.  Head CT without contrast 08/05/2003.  CTA NECK  Findings:  Negative visualized lung apices.  No superior mediastinal lymphadenopathy.  Negative thyroid, larynx, pharynx, parapharyngeal spaces, retropharyngeal space, sublingual space, submandibular and parotid glands.  Postoperative changes to the globes.  Previous paranasal sinus surgery with residual widespread mucosal thickening.  Mastoids are clear.  Previous anterior and posterior cervical fusion from C3 to T1 including multilevel cervical corpectomy.  Subsequent fusion hardware streak artifact at multiple levels.  No suspicious osseous lesion is identified.  Vascular Findings: Mild arch atherosclerosis.  Great vessel origins are within normal limits.  Suboptimal intraarterial contrast bolus timing in the neck.  Normal right common carotid artery to the level of the bifurcation. .  Soft and calcified plaque occurs at the right carotid bifurcation and involves the right ICA origin and bulb.  Focal intimal flap (series 8 image 77) through the right ICA origin. High-grade  right ICA stenosis just beyond this level (image 79). The cervical right ICA remains patent to the skull base.  Right vertebral artery origin largely obscured by hardware and body habitus streak artifact.  The right vertebral artery is patent to the skull base.  Normal left common carotid artery to the carotid bifurcation. Calcified atherosclerosis occurs at the bifurcation but results in less than 50 % stenosis with respect to the distal vessel.  There is calcified atherosclerosis in the distal cervical left ICA just proximal to the skull base which also does not result in hemodynamically significant stenosis.  The left vertebral  artery origin is obscured similar to that on the right.  The left vertebral artery is patent in the neck to the skull base.   Review of the MIP images confirms the above findings.  IMPRESSION: 1. High-grade right ICA origin stenosis related to calcified and soft plaque, and suggestion of ulcerated plaque with focal dissection (series 8 image 77). 2.  Left carotid atherosclerosis without hemodynamically significant stenosis in the neck. 3.  Suboptimal intraarterial contrast bolus timing in the neck. Vertebral artery origins obscured by streak artifact. 4.  See intracranial findings below. 5.  Extensive postoperative changes to the cervical spine.  CTA HEAD  Findings:  Interval paranasal sinus surgery.  Residual mucosal thickening.  Mastoids are clear. No evidence of previous craniotomy.  No suspicious calvarial osseous lesion. Visualized scalp soft tissues are within normal limits.  Stable cerebral volume.  No ventriculomegaly. No midline shift, mass effect, or evidence of mass lesion.  No acute intracranial hemorrhage identified.  No evidence of cortically based acute infarction identified.  No abnormal enhancement identified.  Vascular Findings: Major intracranial venous structures appear to be enhancing.  Suboptimal intraarterial contrast bolus timing. Diminutive distal vertebral arteries are codominant.  The vertebrobasilar junction is patent with calcified atherosclerosis. No definite basilar artery stenosis; the basilar is diminutive owing to bilateral fetal type PCA origins.  Proximal PCAs are patent.  No definite major PCA branch occlusion.  Both ICA siphons are patent with little to no calcified atherosclerosis.  Carotid termini are patent.  MCA and ACA origins are within normal limits.  The anterior communicating artery is diminutive or absent.  Visualized ACA branches are grossly normal.  Both MCA M1 segment are patent without evidence of stenosis.  The visualized bilateral MCA branches are within normal  limits.   Review of the MIP images confirms the above findings.  IMPRESSION: 1.  Suboptimal contrast bolus timing.  No proximal intracranial stenosis or major circle of Willis branch occlusion. 2.  Diminutive posterior circulation on the basis of bilateral fetal PCA origins.  3. Normal for age CT appearance of the brain. 4.  Chronic paranasal sinus disease status post sinus surgery.  Original Report Authenticated By: Harley Hallmark, M.D.     Assessment/Plan:  66 year old man with severe right ICA stenosis and possible dissection, as described above, asymptomatic. However, patient is likely a significant risk for acute embolic or thrombotic stroke in the right ICA distribution.  Recommendation: 1. Continue anticoagulation with heparin drip. 2. Interventional radiology evaluation with catheter angiogram, with likely intervention for management of stenosis the right ICA, or surgical intervention.  Venetia Maxon M.D. [facility] [phone]  12/05/2011, 6:31 AM

## 2011-12-05 NOTE — H&P (Signed)
Name: [name] MRN: [id number] DOB: 1945-10-20    LOS: 1  PCCM ADMISSION NOTE  History of Present Illness: 66 yo who presented to [facility] with severe low back pain radiating to legs bilaterally, shaking and weakness, which started suddenly without specific precipitating factors.  Incidental finding was [name] dissection flap.  Neurology was consulted.  Patient was transferred to [facility] for further management.  Lines / Drains: None  Cultures: 3/19  Blood  Antibiotics: 3/19  Ceftriaxone 3/19  Vancomycin  Tests / Events: 3/19  CT abdomen >>> No acute findings 3/19  CT angio head / neck >>> High-grade right ICA origin stenosis related to calcified and soft plaque, and suggestion of ulcerated plaque with focal dissection (series 8 image 77). Left carotid atherosclerosis without hemodynamically significant stenosis in the neck. Suboptimal intraarterial contrast bolus timing in the neck. Vertebral artery origins obscured by streak artifact. Extensive postoperative changes to the cervical spine. Suboptimal contrast bolus timing. No proximal intracranial  stenosis or major circle of Willis branch occlusion. Diminutive posterior circulation on the basis of bilateral  fetal PCA origins. Normal for age CT appearance of the brain. Chronic paranasal sinus disease status post sinus surgery.  Past Medical History  Diagnosis Date  . Hyperlipidemia   . Sleep apnea   . Personal history of malignant neoplasm of prostate   . Malignant neoplasm of bladder, part unspecified   . Peripheral vascular disease, unspecified   . Personal history of other diseases of digestive system   . Osteoarthrosis, unspecified whether generalized or localized, unspecified site   . Other constipation   . Diabetes mellitus   . Hypertension   . Gouty arthritis   . Prolapsed cervical disc   . Carotid stenosis, right     severe, asymptomatic  . OSA on CPAP   . Obesity   . Cellulitis Apr 25, 2011    right leg   Past Surgical  History  Procedure Date  . Cervical fusion   . Appendectomy   . Brain surgery 2011  . Septoplasty   . Cataract extraction, bilateral   . Colonoscopy 04/2011   Prior to Admission medications   Medication Sig Start Date End Date Taking? Authorizing Provider  allopurinol (ZYLOPRIM) 300 MG tablet Take 300 mg by mouth at bedtime.    Yes Historical Provider, MD  amLODipine (NORVASC) 5 MG tablet Take 1 tablet (5 mg total) by mouth daily. 09/04/11 09/03/12 Yes Antonieta Iba, MD  aspirin 81 MG EC tablet Take 162 mg by mouth at bedtime.    Yes Historical Provider, MD  cholecalciferol (VITAMIN D) 1000 UNITS tablet Take 1,000 Units by mouth daily.   Yes Historical Provider, MD  diclofenac (VOLTAREN) 75 MG EC tablet Take 75 mg by mouth 2 (two) times daily. For 1 to 2 weeks routinely   Yes Historical Provider, MD  docusate sodium (COLACE) 100 MG capsule Take 200 mg by mouth at bedtime.   Yes Historical Provider, MD  furosemide (LASIX) 40 MG tablet Take 40 mg by mouth daily. 1 tablet po prn for SOB or swelling    Yes Historical Provider, MD  gabapentin (NEURONTIN) 300 MG capsule Take 300 mg by mouth 3 (three) times daily.     Yes Historical Provider, MD  glimepiride (AMARYL) 2 MG tablet Take 2 mg by mouth 2 (two) times daily.     Yes Historical Provider, MD  insulin detemir (LEVEMIR) 100 UNIT/ML injection Inject 8 Units into the skin at bedtime.  Yes Historical Provider, MD  labetalol (NORMODYNE) 300 MG tablet Take 300 mg by mouth 2 (two) times daily.     Yes Historical Provider, MD  losartan (COZAAR) 100 MG tablet Take 1 tablet (100 mg total) by mouth daily. 09/04/11 09/03/12 Yes Antonieta Iba, MD  niacin (NIASPAN) 1000 MG CR tablet Take 2,000 mg by mouth at bedtime.    Yes Historical Provider, MD  omega-3 acid ethyl esters (LOVAZA) 1 G capsule Take 1 g by mouth 2 (two) times daily.    Yes Historical Provider, MD  Potassium Gluconate 595 MG CAPS Take 1 capsule by mouth daily.   Yes Historical  Provider, MD  rosuvastatin (CRESTOR) 5 MG tablet Take 5 mg by mouth daily.     Yes Historical Provider, MD  sitaGLIPtan (JANUVIA) 100 MG tablet Take 100 mg by mouth daily.     Yes Historical Provider, MD  Tamsulosin HCl (FLOMAX) 0.4 MG CAPS Take 0.4 mg by mouth at bedtime.   Yes Historical Provider, MD  acetaminophen (TYLENOL) 500 MG tablet Take 500 mg by mouth every 6 (six) hours as needed.      Historical Provider, MD  colchicine 0.6 MG tablet Take 0.6 mg by mouth daily as needed.      Historical Provider, MD  HYDROcodone-acetaminophen (NORCO) 5-325 MG per tablet Take 1 tablet by mouth every 6 (six) hours as needed. For pain    Historical Provider, MD   Allergies Allergies  Allergen Reactions  . Pregabalin Nausea Only   Family History Family History  Problem Relation Age of Onset  . Cancer Other     Family hx  . Diabetes Other     family hx  . Arthritis Other     family hx  . Cancer Mother   . Heart disease Father     MI  . Colon cancer Brother    Social History  reports that he has quit smoking. His smoking use included Cigarettes. He smoked 1 pack per day. He has never used smokeless tobacco. He reports that he does not drink alcohol or use illicit drugs.  Review Of Systems  Negative except as in HPI  Vital Signs: Temp:  [98.8 F (37.1 C)-104 F (40 C)] 104 F (40 C) (03/19 2308) Pulse Rate:  [113-125] 113  (03/20 0100) Resp:  [21-27] 23  (03/20 0100) BP: (130-196)/(63-146) 145/65 mmHg (03/20 0100) SpO2:  [90 %-97 %] 95 % (03/20 0100) Weight:  [109.1 kg (240 lb 8.4 oz)] 109.1 kg (240 lb 8.4 oz) (03/20 0054)   Physical Examination: General:  No acute distress, appears tired Neuro:  Awake, alert, oriented, nonfocal HEENT:  PERRL, pink conjunctivae, moist membranes Neck:  Supple, no JVD   Cardiovascular:  RRR, no M/R/G Lungs:  Bilateral diminished air entry, no W/R/R Abdomen:  Soft, nontender, nondistended, bowel sounds present Musculoskeletal:  Moves all  extremities, no pedal edema Skin:  No rash  Ventilator settings:   Labs and Imaging:  Reviewed.  Please refer to the Assessment and Plan section for relevant results.  ASSESSMENT AND PLAN  NEUROLOGIC A:  Asymptomatic carotid plaque dissection / carotid stenosis - likely incidental finding - Neurology consulted (Dr. Roseanne Reno).  History of CSF leak repaired at [facility] one year ago - no current signs of CSF leak - discussed with Dr. Baldomero Lamy (ENT, [facility]  Severe low back pain on the background of radiculopathy.  Reportedly encephalopathic upon initial presentation, now appropriate. P: -->  Per Neurology  PULMONARY No results found  for this basename: PHART:5,PCO2:5,PCO2ART:5,PO2ART:5,HCO3:5,O2SAT:5 in the last 168 hours A:  No acute issues.  History of obstructive sleep apnea. P: -->  Supplemental oxygen to maintain SpO2>92% -->  CPAP per RT  CARDIOVASCULAR  Lab 12/04/11 2125 12/04/11 1940  TROPONINI -- <0.30  LATICACIDVEN 1.5 --  PROBNP -- --   A:  Hemodynamically stable.  No arrhythmia / ischemia.  Initially hypertensive on the background of severe back pain, now normotensive.  History of hypertension / PVD / Dyslipidemia. P: -->  Continue preadmission medications.  RENAL  Lab 12/04/11 1940  NA 140  K 3.9  CL 100  CO2 29  BUN 17  CREATININE 1.19  CALCIUM 10.2  MG --  PHOS --   A:  Normal renal function and electrolytes. P: -->  BMP, Mg, Phos in AM  GASTROINTESTINAL  Lab 12/04/11 1940  AST 27  ALT 28  ALKPHOS 79  BILITOT 0.3  PROT 8.0  ALBUMIN 4.5   A:  No active issues. P: -->  No interventions required  HEMATOLOGIC  Lab 12/04/11 1940  HGB 13.8  HCT 40.1  PLT 156  INR --  APTT --   A:  No active issues. P: -->  CBC in AM  INFECTIOUS  Lab 12/04/11 1940  WBC 10.4  PROCALCITON --   A:  No overt signs of infection.  Reportedly febrile in ED. P: -->  Blood cultures / PCT -->  Empirical Vancomycin / Ceftriaxone   ENDOCRINE  Lab  12/04/11 2334  GLUCAP 177*   A:  Diabetes, Hyperglycemia. P: -->  CBG checks / SSI -->  Hold oral agents for now as NPO  BEST PRACTICE / DISPOSITION -->  ICU status under PCCM -->  Neurology consulting -->  Full code -->  NPO -->  DVT Px is not required (on Heparin gtt) -->  GI Px is not required. -->  Family updated at bedside  Orlean Bradford, M.D., F.C.C.P. Pulmonary and Critical Care Medicine [facility] Cell: [phone] Pager: [phone]  12/05/2011, 1:50 AM

## 2011-12-06 ENCOUNTER — Encounter (HOSPITAL_COMMUNITY): Payer: Self-pay | Admitting: *Deleted

## 2011-12-06 DIAGNOSIS — R0989 Other specified symptoms and signs involving the circulatory and respiratory systems: Secondary | ICD-10-CM

## 2011-12-06 DIAGNOSIS — G9340 Encephalopathy, unspecified: Secondary | ICD-10-CM

## 2011-12-06 DIAGNOSIS — M545 Low back pain, unspecified: Secondary | ICD-10-CM

## 2011-12-06 DIAGNOSIS — I6529 Occlusion and stenosis of unspecified carotid artery: Secondary | ICD-10-CM

## 2011-12-06 DIAGNOSIS — E119 Type 2 diabetes mellitus without complications: Secondary | ICD-10-CM

## 2011-12-06 LAB — CBC
MCV: 91.6 fL (ref 78.0–100.0)
Platelets: 132 10*3/uL — ABNORMAL LOW (ref 150–400)
RDW: 13.6 % (ref 11.5–15.5)
WBC: 13.9 10*3/uL — ABNORMAL HIGH (ref 4.0–10.5)

## 2011-12-06 LAB — HEPARIN LEVEL (UNFRACTIONATED): Heparin Unfractionated: <0.1 IU/mL — ABNORMAL LOW (ref 0.30–0.70)

## 2011-12-06 MED ORDER — CLOPIDOGREL BISULFATE 75 MG PO TABS: 75.0000 mg | ORAL_TABLET | Freq: Every day | ORAL | Status: DC

## 2011-12-06 MED ORDER — CLOPIDOGREL BISULFATE 75 MG PO TABS: 75.0000 mg | ORAL_TABLET | Freq: Every day | ORAL | Status: AC

## 2011-12-06 MED ORDER — ASPIRIN EC 81 MG PO TBEC
81.0000 mg | DELAYED_RELEASE_TABLET | Freq: Every day | ORAL | Status: DC
  Filled 2011-12-06: qty 1

## 2011-12-06 NOTE — Progress Notes (Signed)
Subjective: Patient without complaints.  Vascular has seen the patient and are requesting further outpatient evaluation.  Their input is appreciated.  Have discussed the options at length with the patient and family.  Stent placement was decided upon and IR has been contacted.    Objective: Current vital signs: BP 152/62  Pulse 74  Temp(Src) 97.5 F (36.4 C) (Oral)  Resp 18  Ht 5\' 9"  (1.753 m)  Wt 109.1 kg (240 lb 8.4 oz)  BMI 35.52 kg/m2  SpO2 96% Vital signs in last 24 hours: Temp:  [97.5 F (36.4 C)-97.9 F (36.6 C)] 97.5 F (36.4 C) (03/21 1152) Pulse Rate:  [74-87] 74  (03/21 1152) Resp:  [16-18] 18  (03/21 1152) BP: (132-154)/(58-73) 152/62 mmHg (03/21 1152) SpO2:  [92 %-99 %] 96 % (03/21 1152)  Intake/Output from previous day: 03/20 0701 - 03/21 0700 In: 1440 [P.O.:240; I.V.:1200] Out: 3150 [Urine:3150] Intake/Output this shift: Total I/O In: 240 [P.O.:240] Out: -  Nutritional status: Carb Control  Neurologic Exam: Mental Status: Alert, oriented, thought content appropriate. Speech fluent without evidence of aphasia. Able to follow commands without difficulty.  Cranial Nerves:  II-Visual fields were normal.  III/IV/VI-Extraocular movements intact. Pupils reacted normally to light.  V/VII-no facial numbness and no facial weakness  VIII-grossly intact  X-normal speech  XII-midline tongue extension  Motor: 5/5 bilaterally with normal tone and bulk  Sensory: light touch intact throughout, bilaterally  Deep Tendon Reflexes: 1+ and symmetric throughout  Plantars: Downgoing bilaterally  Cerebellar: Normal finger-to-nose    Lab Results: Results for orders placed during the hospital encounter of 12/04/11 (from the past 48 hour(s))  CBC     Status: Normal   Collection Time   12/04/11  7:40 PM      Component Value Range Comment   WBC 10.4  4.0 - 10.5 (K/uL)    RBC 4.36  4.22 - 5.81 (MIL/uL)    Hemoglobin 13.8  13.0 - 17.0 (g/dL)    HCT 16.1  09.6 - 04.5 (%)    MCV 92.0  78.0 - 100.0 (fL)    MCH 31.7  26.0 - 34.0 (pg)    MCHC 34.4  30.0 - 36.0 (g/dL)    RDW 40.9  81.1 - 91.4 (%)    Platelets 156  150 - 400 (K/uL)   DIFFERENTIAL     Status: Abnormal   Collection Time   12/04/11  7:40 PM      Component Value Range Comment   Neutrophils Relative 74  43 - 77 (%)    Neutro Abs 7.7  1.7 - 7.7 (K/uL)    Lymphocytes Relative 11 (*) 12 - 46 (%)    Lymphs Abs 1.2  0.7 - 4.0 (K/uL)    Monocytes Relative 12  3 - 12 (%)    Monocytes Absolute 1.2 (*) 0.1 - 1.0 (K/uL)    Eosinophils Relative 3  0 - 5 (%)    Eosinophils Absolute 0.4  0.0 - 0.7 (K/uL)    Basophils Relative 0  0 - 1 (%)    Basophils Absolute 0.0  0.0 - 0.1 (K/uL)   BASIC METABOLIC PANEL     Status: Abnormal   Collection Time   12/04/11  7:40 PM      Component Value Range Comment   Sodium 140  135 - 145 (mEq/L)    Potassium 3.9  3.5 - 5.1 (mEq/L)    Chloride 100  96 - 112 (mEq/L)    CO2 29  19 - 32 (  mEq/L)    Glucose, Bld 120 (*) 70 - 99 (mg/dL)    BUN 17  6 - 23 (mg/dL)    Creatinine, Ser 4.54  0.50 - 1.35 (mg/dL)    Calcium 09.8  8.4 - 10.5 (mg/dL)    GFR calc non Af Amer 62 (*) >90 (mL/min)    GFR calc Af Amer 72 (*) >90 (mL/min)   TROPONIN I     Status: Normal   Collection Time   12/04/11  7:40 PM      Component Value Range Comment   Troponin I <0.30  <0.30 (ng/mL)   LIPASE, BLOOD     Status: Normal   Collection Time   12/04/11  7:40 PM      Component Value Range Comment   Lipase 38  11 - 59 (U/L)   HEPATIC FUNCTION PANEL     Status: Normal   Collection Time   12/04/11  7:40 PM      Component Value Range Comment   Total Protein 8.0  6.0 - 8.3 (g/dL)    Albumin 4.5  3.5 - 5.2 (g/dL)    AST 27  0 - 37 (U/L)    ALT 28  0 - 53 (U/L)    Alkaline Phosphatase 79  39 - 117 (U/L)    Total Bilirubin 0.3  0.3 - 1.2 (mg/dL)    Bilirubin, Direct <1.1  0.0 - 0.3 (mg/dL)    Indirect Bilirubin NOT CALCULATED  0.3 - 0.9 (mg/dL)   CULTURE, BLOOD (ROUTINE X 2)     Status: Normal  (Preliminary result)   Collection Time   12/04/11  8:44 PM      Component Value Range Comment   Specimen Description BLOOD LEFT ARM      Special Requests BOTTLES DRAWN AEROBIC AND ANAEROBIC 3CC EACH      Culture NO GROWTH 2 DAYS      Report Status PENDING     CULTURE, BLOOD (ROUTINE X 2)     Status: Normal (Preliminary result)   Collection Time   12/04/11  8:44 PM      Component Value Range Comment   Specimen Description BLOOD RIGHT ANTECUBITAL      Special Requests BOTTLES DRAWN AEROBIC AND ANAEROBIC 4CC EACH      Culture NO GROWTH 2 DAYS      Report Status PENDING     LACTIC ACID, PLASMA     Status: Normal   Collection Time   12/04/11  9:25 PM      Component Value Range Comment   Lactic Acid, Venous 1.5  0.5 - 2.2 (mmol/L)   URINALYSIS, ROUTINE W REFLEX MICROSCOPIC     Status: Abnormal   Collection Time   12/04/11 10:38 PM      Component Value Range Comment   Color, Urine STRAW (*) YELLOW     APPearance CLEAR  CLEAR     Specific Gravity, Urine <1.005 (*) 1.005 - 1.030     pH 6.0  5.0 - 8.0     Glucose, UA NEGATIVE  NEGATIVE (mg/dL)    Hgb urine dipstick SMALL (*) NEGATIVE     Bilirubin Urine NEGATIVE  NEGATIVE     Ketones, ur NEGATIVE  NEGATIVE (mg/dL)    Protein, ur NEGATIVE  NEGATIVE (mg/dL)    Urobilinogen, UA 0.2  0.0 - 1.0 (mg/dL)    Nitrite NEGATIVE  NEGATIVE     Leukocytes, UA NEGATIVE  NEGATIVE    URINE MICROSCOPIC-ADD ON  Status: Normal   Collection Time   12/04/11 10:38 PM      Component Value Range Comment   Squamous Epithelial / LPF RARE  RARE     WBC, UA 0-2  <3 (WBC/hpf)    RBC / HPF 0-2  <3 (RBC/hpf)    Bacteria, UA RARE  RARE    GLUCOSE, CAPILLARY     Status: Abnormal   Collection Time   12/04/11 11:34 PM      Component Value Range Comment   Glucose-Capillary 177 (*) 70 - 99 (mg/dL)   MRSA PCR SCREENING     Status: Normal   Collection Time   12/05/11  1:02 AM      Component Value Range Comment   MRSA by PCR NEGATIVE  NEGATIVE    PROCALCITONIN      Status: Normal   Collection Time   12/05/11  4:00 AM      Component Value Range Comment   Procalcitonin 0.36     HEPARIN LEVEL (UNFRACTIONATED)     Status: Abnormal   Collection Time   12/05/11  4:00 AM      Component Value Range Comment   Heparin Unfractionated 0.24 (*) 0.30 - 0.70 (IU/mL)   CBC     Status: Abnormal   Collection Time   12/05/11  4:00 AM      Component Value Range Comment   WBC 16.3 (*) 4.0 - 10.5 (K/uL)    RBC 4.08 (*) 4.22 - 5.81 (MIL/uL)    Hemoglobin 12.7 (*) 13.0 - 17.0 (g/dL)    HCT 16.1 (*) 09.6 - 52.0 (%)    MCV 90.4  78.0 - 100.0 (fL)    MCH 31.1  26.0 - 34.0 (pg)    MCHC 34.4  30.0 - 36.0 (g/dL)    RDW 04.5  40.9 - 81.1 (%)    Platelets 127 (*) 150 - 400 (K/uL)   PROTIME-INR     Status: Normal   Collection Time   12/05/11  4:00 AM      Component Value Range Comment   Prothrombin Time 14.1  11.6 - 15.2 (seconds)    INR 1.07  0.00 - 1.49    HEPARIN LEVEL (UNFRACTIONATED)     Status: Abnormal   Collection Time   12/05/11  8:34 AM      Component Value Range Comment   Heparin Unfractionated <0.10 (*) 0.30 - 0.70 (IU/mL)   GLUCOSE, CAPILLARY     Status: Abnormal   Collection Time   12/05/11 11:16 AM      Component Value Range Comment   Glucose-Capillary 197 (*) 70 - 99 (mg/dL)   GLUCOSE, CAPILLARY     Status: Abnormal   Collection Time   12/05/11  5:10 PM      Component Value Range Comment   Glucose-Capillary 164 (*) 70 - 99 (mg/dL)    Comment 1 Notify RN      Comment 2 Documented in Chart     GLUCOSE, CAPILLARY     Status: Abnormal   Collection Time   12/05/11 10:08 PM      Component Value Range Comment   Glucose-Capillary 159 (*) 70 - 99 (mg/dL)   HEPARIN LEVEL (UNFRACTIONATED)     Status: Abnormal   Collection Time   12/06/11  5:21 AM      Component Value Range Comment   Heparin Unfractionated <0.10 (*) 0.30 - 0.70 (IU/mL)   CBC     Status: Abnormal   Collection Time  12/06/11  5:21 AM      Component Value Range Comment   WBC 13.9 (*) 4.0 -  10.5 (K/uL)    RBC 3.82 (*) 4.22 - 5.81 (MIL/uL)    Hemoglobin 11.7 (*) 13.0 - 17.0 (g/dL)    HCT 16.1 (*) 09.6 - 52.0 (%)    MCV 91.6  78.0 - 100.0 (fL)    MCH 30.6  26.0 - 34.0 (pg)    MCHC 33.4  30.0 - 36.0 (g/dL)    RDW 04.5  40.9 - 81.1 (%)    Platelets 132 (*) 150 - 400 (K/uL)   GLUCOSE, CAPILLARY     Status: Abnormal   Collection Time   12/06/11  6:55 AM      Component Value Range Comment   Glucose-Capillary 156 (*) 70 - 99 (mg/dL)   GLUCOSE, CAPILLARY     Status: Abnormal   Collection Time   12/06/11 11:06 AM      Component Value Range Comment   Glucose-Capillary 132 (*) 70 - 99 (mg/dL)     Recent Results (from the past 240 hour(s))  CULTURE, BLOOD (ROUTINE X 2)     Status: Normal (Preliminary result)   Collection Time   12/04/11  8:44 PM      Component Value Range Status Comment   Specimen Description BLOOD LEFT ARM   Final    Special Requests BOTTLES DRAWN AEROBIC AND ANAEROBIC 3CC EACH   Final    Culture NO GROWTH 2 DAYS   Final    Report Status PENDING   Incomplete   CULTURE, BLOOD (ROUTINE X 2)     Status: Normal (Preliminary result)   Collection Time   12/04/11  8:44 PM      Component Value Range Status Comment   Specimen Description BLOOD RIGHT ANTECUBITAL   Final    Special Requests BOTTLES DRAWN AEROBIC AND ANAEROBIC 4CC EACH   Final    Culture NO GROWTH 2 DAYS   Final    Report Status PENDING   Incomplete   MRSA PCR SCREENING     Status: Normal   Collection Time   12/05/11  1:02 AM      Component Value Range Status Comment   MRSA by PCR NEGATIVE  NEGATIVE  Final     Lipid Panel No results found for this basename: CHOL,TRIG,HDL,CHOLHDL,VLDL,LDLCALC in the last 72 hours  Studies/Results: Ct Abdomen Pelvis Wo Contrast  12/04/2011  *RADIOLOGY REPORT*  Clinical Data: Severe back and leg pain  CT ABDOMEN AND PELVIS WITHOUT CONTRAST  Technique:  Multidetector CT imaging of the abdomen and pelvis was performed following the standard protocol without intravenous  contrast.  Comparison: 11/13/2007  Findings: The lung bases are clear.  No pericardial or pleural effusion identified.  Within the limitations of noncontrast technique the liver parenchyma appears intact.  Gallbladder is negative.  There is no biliary dilatation.  The pancreas appears normal.  Normal appearance of both adrenal glands.  The spleen appears normal.  Mild, symmetric perinephric fat stranding is identified and appears unchanged from previous exam.  Low density structures within both kidneys are again noted, compatible with cysts.  These are incompletely characterized on today's exam due to lack of IV contrast.  No upper abdominal adenopathy identified.  There is no pelvic or inguinal adenopathy.  Seed implants identified within the prostate gland.  The urinary bladder appears normal.  The stomach and the small bowel loops are normal.  The proximal colon is normal.  There are multiple  diverticula arising from the left colon and sigmoid colon without evidence for acute inflammation.  No free fluid or abnormal fluid collections within the abdomen or pelvis.  Normal caliber of the abdominal aorta.  No aneurysm.  Review of the visualized osseous structures shows evidence of multilevel lumbar degenerative disc disease.  There is a first- degree anterolisthesis of L4 on L5.  IMPRESSION:  1.  No acute findings identified within the abdomen or pelvis.  Original Report Authenticated By: Rosealee Albee, M.D.   Ct Angio Head W/cm &/or Wo Cm  12/04/2011  *RADIOLOGY REPORT*  Clinical Data:  66 year old male with abnormal shaking.  Pain. Flushing.  History of prostate cancer.  Prior cervical spine and brain surgery not otherwise specified.  CT ANGIOGRAPHY HEAD AND NECK  Technique:  Multidetector CT imaging of the head and neck was performed using the standard protocol during bolus administration of intravenous contrast.  Multiplanar CT image reconstructions including MIPs were obtained to evaluate the vascular  anatomy. Carotid stenosis measurements (when applicable) are obtained utilizing NASCET criteria, using the distal internal carotid diameter as the denominator.  Contrast: OMNIPAQUE IOHEXOL 350 MG/ML IV SOLN 100 ml Omnipaque 350.  Comparison:  Cervical MRI 05/11/2005.  Head CT without contrast 08/05/2003.  CTA NECK  Findings:  Negative visualized lung apices.  No superior mediastinal lymphadenopathy.  Negative thyroid, larynx, pharynx, parapharyngeal spaces, retropharyngeal space, sublingual space, submandibular and parotid glands.  Postoperative changes to the globes.  Previous paranasal sinus surgery with residual widespread mucosal thickening.  Mastoids are clear.  Previous anterior and posterior cervical fusion from C3 to T1 including multilevel cervical corpectomy.  Subsequent fusion hardware streak artifact at multiple levels.  No suspicious osseous lesion is identified.  Vascular Findings: Mild arch atherosclerosis.  Great vessel origins are within normal limits.  Suboptimal intraarterial contrast bolus timing in the neck.  Normal right common carotid artery to the level of the bifurcation. .  Soft and calcified plaque occurs at the right carotid bifurcation and involves the right ICA origin and bulb.  Focal intimal flap (series 8 image 77) through the right ICA origin. High-grade right ICA stenosis just beyond this level (image 79). The cervical right ICA remains patent to the skull base.  Right vertebral artery origin largely obscured by hardware and body habitus streak artifact.  The right vertebral artery is patent to the skull base.  Normal left common carotid artery to the carotid bifurcation. Calcified atherosclerosis occurs at the bifurcation but results in less than 50 % stenosis with respect to the distal vessel.  There is calcified atherosclerosis in the distal cervical left ICA just proximal to the skull base which also does not result in hemodynamically significant stenosis.  The left  vertebral artery origin is obscured similar to that on the right.  The left vertebral artery is patent in the neck to the skull base.   Review of the MIP images confirms the above findings.  IMPRESSION: 1. High-grade right ICA origin stenosis related to calcified and soft plaque, and suggestion of ulcerated plaque with focal dissection (series 8 image 77). 2.  Left carotid atherosclerosis without hemodynamically significant stenosis in the neck. 3.  Suboptimal intraarterial contrast bolus timing in the neck. Vertebral artery origins obscured by streak artifact. 4.  See intracranial findings below. 5.  Extensive postoperative changes to the cervical spine.  CTA HEAD  Findings:  Interval paranasal sinus surgery.  Residual mucosal thickening.  Mastoids are clear. No evidence of previous craniotomy.  No  suspicious calvarial osseous lesion. Visualized scalp soft tissues are within normal limits.  Stable cerebral volume.  No ventriculomegaly. No midline shift, mass effect, or evidence of mass lesion.  No acute intracranial hemorrhage identified.  No evidence of cortically based acute infarction identified.  No abnormal enhancement identified.  Vascular Findings: Major intracranial venous structures appear to be enhancing.  Suboptimal intraarterial contrast bolus timing. Diminutive distal vertebral arteries are codominant.  The vertebrobasilar junction is patent with calcified atherosclerosis. No definite basilar artery stenosis; the basilar is diminutive owing to bilateral fetal type PCA origins.  Proximal PCAs are patent.  No definite major PCA branch occlusion.  Both ICA siphons are patent with little to no calcified atherosclerosis.  Carotid termini are patent.  MCA and ACA origins are within normal limits.  The anterior communicating artery is diminutive or absent.  Visualized ACA branches are grossly normal.  Both MCA M1 segment are patent without evidence of stenosis.  The visualized bilateral MCA branches are  within normal limits.   Review of the MIP images confirms the above findings.  IMPRESSION: 1.  Suboptimal contrast bolus timing.  No proximal intracranial stenosis or major circle of Willis branch occlusion. 2.  Diminutive posterior circulation on the basis of bilateral fetal PCA origins.  3. Normal for age CT appearance of the brain. 4.  Chronic paranasal sinus disease status post sinus surgery.  Original Report Authenticated By: Harley Hallmark, M.D.   Dg Chest 1 View  12/04/2011  *RADIOLOGY REPORT*  Clinical Data: Back pain and uncontrollable shaking and weakness. History of prostate cancer.  CHEST - 1 VIEW  Comparison: Chest x-ray 08/06/2010.  Findings: Study is limited by lack of visualization of the right costophrenic sulcus.  Lung volumes are lower limits of normal.  No consolidative airspace disease.  No left pleural effusion. Pulmonary vasculature appears slightly crowded, likely related to slightly diminished lung volumes.  No evidence of pulmonary edema. No definite suspicious appearing pulmonary nodules or masses. Heart size is upper limits of normal.  Mediastinal contours are unremarkable.  Orthopedic fixation hardware is incompletely visualized in the lower cervical spine.  IMPRESSION: 1.  No radiographic evidence of acute cardiopulmonary disease.  Original Report Authenticated By: Florencia Reasons, M.D.   Ct Angio Neck W/cm &/or Wo/cm  12/04/2011  *RADIOLOGY REPORT*  Clinical Data:  66 year old male with abnormal shaking.  Pain. Flushing.  History of prostate cancer.  Prior cervical spine and brain surgery not otherwise specified.  CT ANGIOGRAPHY HEAD AND NECK  Technique:  Multidetector CT imaging of the head and neck was performed using the standard protocol during bolus administration of intravenous contrast.  Multiplanar CT image reconstructions including MIPs were obtained to evaluate the vascular anatomy. Carotid stenosis measurements (when applicable) are obtained utilizing NASCET  criteria, using the distal internal carotid diameter as the denominator.  Contrast: OMNIPAQUE IOHEXOL 350 MG/ML IV SOLN 100 ml Omnipaque 350.  Comparison:  Cervical MRI 05/11/2005.  Head CT without contrast 08/05/2003.  CTA NECK  Findings:  Negative visualized lung apices.  No superior mediastinal lymphadenopathy.  Negative thyroid, larynx, pharynx, parapharyngeal spaces, retropharyngeal space, sublingual space, submandibular and parotid glands.  Postoperative changes to the globes.  Previous paranasal sinus surgery with residual widespread mucosal thickening.  Mastoids are clear.  Previous anterior and posterior cervical fusion from C3 to T1 including multilevel cervical corpectomy.  Subsequent fusion hardware streak artifact at multiple levels.  No suspicious osseous lesion is identified.  Vascular Findings: Mild arch atherosclerosis.  [employer]  vessel origins are within normal limits.  Suboptimal intraarterial contrast bolus timing in the neck.  Normal right common carotid artery to the level of the bifurcation. .  Soft and calcified plaque occurs at the right carotid bifurcation and involves the right ICA origin and bulb.  Focal intimal flap (series 8 image 77) through the right ICA origin. High-grade right ICA stenosis just beyond this level (image 79). The cervical right ICA remains patent to the skull base.  Right vertebral artery origin largely obscured by hardware and body habitus streak artifact.  The right vertebral artery is patent to the skull base.  Normal left common carotid artery to the carotid bifurcation. Calcified atherosclerosis occurs at the bifurcation but results in less than 50 % stenosis with respect to the distal vessel.  There is calcified atherosclerosis in the distal cervical left ICA just proximal to the skull base which also does not result in hemodynamically significant stenosis.  The left vertebral artery origin is obscured similar to that on the right.  The left vertebral artery  is patent in the neck to the skull base.   Review of the MIP images confirms the above findings.  IMPRESSION: 1. High-grade right ICA origin stenosis related to calcified and soft plaque, and suggestion of ulcerated plaque with focal dissection (series 8 image 77). 2.  Left carotid atherosclerosis without hemodynamically significant stenosis in the neck. 3.  Suboptimal intraarterial contrast bolus timing in the neck. Vertebral artery origins obscured by streak artifact. 4.  See intracranial findings below. 5.  Extensive postoperative changes to the cervical spine.  CTA HEAD  Findings:  Interval paranasal sinus surgery.  Residual mucosal thickening.  Mastoids are clear. No evidence of previous craniotomy.  No suspicious calvarial osseous lesion. Visualized scalp soft tissues are within normal limits.  Stable cerebral volume.  No ventriculomegaly. No midline shift, mass effect, or evidence of mass lesion.  No acute intracranial hemorrhage identified.  No evidence of cortically based acute infarction identified.  No abnormal enhancement identified.  Vascular Findings: Major intracranial venous structures appear to be enhancing.  Suboptimal intraarterial contrast bolus timing. Diminutive distal vertebral arteries are codominant.  The vertebrobasilar junction is patent with calcified atherosclerosis. No definite basilar artery stenosis; the basilar is diminutive owing to bilateral fetal type PCA origins.  Proximal PCAs are patent.  No definite major PCA branch occlusion.  Both ICA siphons are patent with little to no calcified atherosclerosis.  Carotid termini are patent.  MCA and ACA origins are within normal limits.  The anterior communicating artery is diminutive or absent.  Visualized ACA branches are grossly normal.  Both MCA M1 segment are patent without evidence of stenosis.  The visualized bilateral MCA branches are within normal limits.   Review of the MIP images confirms the above findings.  IMPRESSION: 1.   Suboptimal contrast bolus timing.  No proximal intracranial stenosis or major circle of Willis branch occlusion. 2.  Diminutive posterior circulation on the basis of bilateral fetal PCA origins.  3. Normal for age CT appearance of the brain. 4.  Chronic paranasal sinus disease status post sinus surgery.  Original Report Authenticated By: Harley Hallmark, M.D.   Ir Angio Intra Extracran Sel Com Carotid Innominate Bilat Mod Sed  12/06/2011  *RADIOLOGY REPORT*  Clinical Data:  Left side shaking.  Right neck pain.  Right internal carotid artery stenosis by CT angiogram.  BILATERAL COMMON CAROTID AND BILATERAL VERTEBRAL ARTERY ANGIOGRAMS  Comparison:  CT angiogram of the head and neck of  12/04/2011.  Following a full explanation of the procedure along with the potential associated complications, an informed witnessed consent was obtained.  The right groin was prepped and draped in the usual sterile fashion.  Thereafter using modified Seldinger technique, transfemoral access into the right common femoral artery was obtained without difficulty.  Over a 0.035-inch guidewire, a 5- [place] Pinnacle sheath was inserted.  Through this and also over a 0.035-inch guidewire, a 5-French JB1 catheter was advanced to the aortic arch region and selectively positioned in the right vertebral artery, the right common carotid artery, the left common carotid artery and the left vertebral artery.  There were no acute complications.  The patient tolerated the procedure well.  Medications utilized: Versed 1 mg IV.  Fentanyl 50 mg IV.  Contrast: Omnipaque-300 approximately 55 ml.  Findings:  The right vertebral artery origin demonstrates approximately 40-50% stenoses.  The vessel otherwise opacifies normally to the cranial skull base.  There is normal opacification of the right posterior-inferior cerebellar artery and the right vertebrobasilar junction.  The opacified portions of the basilar artery, the right posterior cerebral artery, the  superior cerebellar arteries and the right anterior-inferior cerebellar artery opacify normally into the capillary and venous phases.  Unopacified blood is seen in the basilar artery from the contralateral vertebral artery.  The right common carotid arteriogram demonstrates the right external carotid artery and its major branches to be normal.  The right internal carotid artery at the bulb has approximately 75- 80% stenosis secondary to a circumferential plaque.  This is associated with a small ulcerated plaque along the posterior aspect of the bulb.  The right internal carotid artery otherwise opacifies normally to the cranial skull base.  There is normal opacification of the petrous, the cavernous and the supraclinoid segments.  A dominant right posterior communicating artery is seen opacifying the right posterior cerebral artery distributions.  The right middle and the right anterior cerebral arteries opacify normally into the capillary and the venous phases.  The left common carotid arteriogram demonstrates the left external carotid artery and its major branches to be normal.  The left internal carotid artery at the bulb has a smooth shallow plaque just distal to the posterior wall of the bulb.  The left internal carotid artery otherwise opacifies normally to the cranial skull base.  The petrous, the cavernous and the supraclinoid segments are normal.  A dominant left posterior communicating artery is seen opacifying the left posterior cerebral and superior cerebellar artery distributions.  The left middle and the left anterior cerebral arteries opacify normally into the capillary and venous phases.  The left vertebral artery origin has approximately 60-70% stenosis. Distal to this the vessel opacifies normally to the cranial skull base.  Normal opacification is seen of the left posterior-inferior cerebellar artery and the left vertebrobasilar junction.  The opacified portions of the basilar artery, the left  posterior cerebral artery, the superior cerebellar arteries and the left anterior-inferior cerebellar artery opacify normally into the capillary and venous phases.  Unopacified blood is seen in the basilar artery from the contralateral vertebral artery.  IMPRESSION: 1.  75-80% stenosis of the right internal carotid artery at the bulb secondary to a circumferential atherosclerotic plaque. 2.  60-70% stenosis of the origin of the left vertebral artery. 3.  40-50% stenosis of the origin of the right vertebral artery.  Original Report Authenticated By: Oneal Grout, M.D.   Ir Angio Vertebral Sel Subclavian Innominate Uni L Mod Sed  12/06/2011  *RADIOLOGY REPORT*  Clinical Data:  Left side shaking.  Right neck pain.  Right internal carotid artery stenosis by CT angiogram.  BILATERAL COMMON CAROTID AND BILATERAL VERTEBRAL ARTERY ANGIOGRAMS  Comparison:  CT angiogram of the head and neck of 12/04/2011.  Following a full explanation of the procedure along with the potential associated complications, an informed witnessed consent was obtained.  The right groin was prepped and draped in the usual sterile fashion.  Thereafter using modified Seldinger technique, transfemoral access into the right common femoral artery was obtained without difficulty.  Over a 0.035-inch guidewire, a 5- [place] Pinnacle sheath was inserted.  Through this and also over a 0.035-inch guidewire, a 5-French JB1 catheter was advanced to the aortic arch region and selectively positioned in the right vertebral artery, the right common carotid artery, the left common carotid artery and the left vertebral artery.  There were no acute complications.  The patient tolerated the procedure well.  Medications utilized: Versed 1 mg IV.  Fentanyl 50 mg IV.  Contrast: Omnipaque-300 approximately 55 ml.  Findings:  The right vertebral artery origin demonstrates approximately 40-50% stenoses.  The vessel otherwise opacifies normally to the cranial skull  base.  There is normal opacification of the right posterior-inferior cerebellar artery and the right vertebrobasilar junction.  The opacified portions of the basilar artery, the right posterior cerebral artery, the superior cerebellar arteries and the right anterior-inferior cerebellar artery opacify normally into the capillary and venous phases.  Unopacified blood is seen in the basilar artery from the contralateral vertebral artery.  The right common carotid arteriogram demonstrates the right external carotid artery and its major branches to be normal.  The right internal carotid artery at the bulb has approximately 75- 80% stenosis secondary to a circumferential plaque.  This is associated with a small ulcerated plaque along the posterior aspect of the bulb.  The right internal carotid artery otherwise opacifies normally to the cranial skull base.  There is normal opacification of the petrous, the cavernous and the supraclinoid segments.  A dominant right posterior communicating artery is seen opacifying the right posterior cerebral artery distributions.  The right middle and the right anterior cerebral arteries opacify normally into the capillary and the venous phases.  The left common carotid arteriogram demonstrates the left external carotid artery and its major branches to be normal.  The left internal carotid artery at the bulb has a smooth shallow plaque just distal to the posterior wall of the bulb.  The left internal carotid artery otherwise opacifies normally to the cranial skull base.  The petrous, the cavernous and the supraclinoid segments are normal.  A dominant left posterior communicating artery is seen opacifying the left posterior cerebral and superior cerebellar artery distributions.  The left middle and the left anterior cerebral arteries opacify normally into the capillary and venous phases.  The left vertebral artery origin has approximately 60-70% stenosis. Distal to this the vessel  opacifies normally to the cranial skull base.  Normal opacification is seen of the left posterior-inferior cerebellar artery and the left vertebrobasilar junction.  The opacified portions of the basilar artery, the left posterior cerebral artery, the superior cerebellar arteries and the left anterior-inferior cerebellar artery opacify normally into the capillary and venous phases.  Unopacified blood is seen in the basilar artery from the contralateral vertebral artery.  IMPRESSION: 1.  75-80% stenosis of the right internal carotid artery at the bulb secondary to a circumferential atherosclerotic plaque. 2.  60-70% stenosis of the origin of the left vertebral artery.  3.  40-50% stenosis of the origin of the right vertebral artery.  Original Report Authenticated By: Oneal Grout, M.D.   Ir Angio Vertebral Sel Vertebral Uni R Mod Sed  12/06/2011  *RADIOLOGY REPORT*  Clinical Data:  Left side shaking.  Right neck pain.  Right internal carotid artery stenosis by CT angiogram.  BILATERAL COMMON CAROTID AND BILATERAL VERTEBRAL ARTERY ANGIOGRAMS  Comparison:  CT angiogram of the head and neck of 12/04/2011.  Following a full explanation of the procedure along with the potential associated complications, an informed witnessed consent was obtained.  The right groin was prepped and draped in the usual sterile fashion.  Thereafter using modified Seldinger technique, transfemoral access into the right common femoral artery was obtained without difficulty.  Over a 0.035-inch guidewire, a 5- [place] Pinnacle sheath was inserted.  Through this and also over a 0.035-inch guidewire, a 5-French JB1 catheter was advanced to the aortic arch region and selectively positioned in the right vertebral artery, the right common carotid artery, the left common carotid artery and the left vertebral artery.  There were no acute complications.  The patient tolerated the procedure well.  Medications utilized: Versed 1 mg IV.  Fentanyl 50  mg IV.  Contrast: Omnipaque-300 approximately 55 ml.  Findings:  The right vertebral artery origin demonstrates approximately 40-50% stenoses.  The vessel otherwise opacifies normally to the cranial skull base.  There is normal opacification of the right posterior-inferior cerebellar artery and the right vertebrobasilar junction.  The opacified portions of the basilar artery, the right posterior cerebral artery, the superior cerebellar arteries and the right anterior-inferior cerebellar artery opacify normally into the capillary and venous phases.  Unopacified blood is seen in the basilar artery from the contralateral vertebral artery.  The right common carotid arteriogram demonstrates the right external carotid artery and its major branches to be normal.  The right internal carotid artery at the bulb has approximately 75- 80% stenosis secondary to a circumferential plaque.  This is associated with a small ulcerated plaque along the posterior aspect of the bulb.  The right internal carotid artery otherwise opacifies normally to the cranial skull base.  There is normal opacification of the petrous, the cavernous and the supraclinoid segments.  A dominant right posterior communicating artery is seen opacifying the right posterior cerebral artery distributions.  The right middle and the right anterior cerebral arteries opacify normally into the capillary and the venous phases.  The left common carotid arteriogram demonstrates the left external carotid artery and its major branches to be normal.  The left internal carotid artery at the bulb has a smooth shallow plaque just distal to the posterior wall of the bulb.  The left internal carotid artery otherwise opacifies normally to the cranial skull base.  The petrous, the cavernous and the supraclinoid segments are normal.  A dominant left posterior communicating artery is seen opacifying the left posterior cerebral and superior cerebellar artery distributions.  The left  middle and the left anterior cerebral arteries opacify normally into the capillary and venous phases.  The left vertebral artery origin has approximately 60-70% stenosis. Distal to this the vessel opacifies normally to the cranial skull base.  Normal opacification is seen of the left posterior-inferior cerebellar artery and the left vertebrobasilar junction.  The opacified portions of the basilar artery, the left posterior cerebral artery, the superior cerebellar arteries and the left anterior-inferior cerebellar artery opacify normally into the capillary and venous phases.  Unopacified blood is seen in the basilar artery  from the contralateral vertebral artery.  IMPRESSION: 1.  75-80% stenosis of the right internal carotid artery at the bulb secondary to a circumferential atherosclerotic plaque. 2.  60-70% stenosis of the origin of the left vertebral artery. 3.  40-50% stenosis of the origin of the right vertebral artery.  Original Report Authenticated By: Oneal Grout, M.D.    Medications:  I have reviewed the patient's current medications. Scheduled:   . allopurinol  300 mg Oral QHS  . amLODipine  5 mg Oral Daily  . aspirin EC  81 mg Oral QHS  . atorvastatin  10 mg Oral q1800  . cefTRIAXone (ROCEPHIN)  IV  2 g Intravenous Q12H  . cholecalciferol  1,000 Units Oral Daily  . clopidogrel  75 mg Oral Q breakfast  . diclofenac  75 mg Oral BID  . docusate sodium  200 mg Oral QHS  . furosemide  40 mg Oral Daily  . gabapentin  300 mg Oral TID  . labetalol  300 mg Oral BID  . losartan  100 mg Oral Daily  . niacin  2,000 mg Oral QHS  . omega-3 acid ethyl esters  1 g Oral BID WC  . Tamsulosin HCl  0.4 mg Oral QHS  . vancomycin  1,000 mg Intravenous Q12H  . DISCONTD: aspirin EC  162 mg Oral QHS    Assessment/Plan:  Patient Active Hospital Problem List: Asymptomatic Carotid Stenosis:   Assessment:  IR aware of patient.  Will see patient next week for stent placement.     Plan: 1.   Patient to start ASA 81mg  and Plavix 75mg  daily and continue on an outpatient basis  2.  D/C heparin  3.  IR to contact patient-they have information-for scheduling  4.  Agree with discharge today.   LOS: 2 days   Thana Farr, MD [facility] [phone] 12/06/2011  10:03 AM

## 2011-12-06 NOTE — Discharge Summary (Signed)
Physician Discharge Summary  Patient ID: [name] MRN: [id number] DOB/AGE: 67-Sep-1947 67 y.o.  Admit date: 12/04/2011 Discharge date: 12/06/2011  Admission Diagnoses: Altered mental status, carotid stenosis  Discharge Diagnoses:  Right carotid artery stenosis Back pain HTN PVD Hyperlipidemia Diabetes  Tests / Events:  3/19 CT abdomen >>> No acute findings  3/19 CT angio head / neck >>> High-grade right ICA origin stenosis related to calcified and soft plaque, and suggestion of ulcerated plaque with focal dissection (series 8 image 77). Left carotid atherosclerosis without hemodynamically significant stenosis in the neck. Suboptimal intraarterial contrast bolus timing in the neck. Vertebral artery origins obscured by streak artifact. Extensive postoperative changes to the cervical spine. Suboptimal contrast bolus timing. No proximal intracranial stenosis or major circle of Willis branch occlusion. Diminutive posterior circulation on the basis of bilateral fetal PCA origins. Normal for age CT appearance of the brain. Chronic paranasal sinus disease status post sinus surgery. 1Approx 75 to 80 % stenosis of RT ICA prox. 2 Approx 50 % stenosis of VA origins .   HPI:  66 yo who presented to [facility] with severe low back pain radiating to legs bilaterally, shaking and weakness, which started suddenly without specific precipitating factors but was associated with marked hypertension and subsided spontaneously. Incidental finding was [name] dissection flap. Neurology was consulted. Patient was transferred to [facility] for further management. PCCM asked to admit as initially placed in ICU after transfer.    Hospital Course:   Sever Right carotid artery stenosis - this has been followed for some time by out-pt cardiology - Neurology consulted (Dr. Roseanne Reno). History of CSF leak repaired at [facility] one year ago - no current signs of CSF leak - discussed with Dr. Baldomero Lamy (ENT, [facility]). Severe low back pain on  the background of radiculopathy. Reportedly encephalopathic upon initial presentation, now appropriate.  Plan -home on aspirin and plavix/ IR will make instructions as to when he should stop these.  -he has out-pt follow up with Dr. Corliss Skains for angioplasty/stenting procedure on Tuesday 12/11/11 at 1 pm. Patient to arrive in xray at 1245    History of hypertension / PVD / Dyslipidemia. HtN was felt to be contributing factor to his facial flushing and shaking on presentation.  Plan --> Continue preadmission medications.     Diabetes, Hyperglycemia.  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  Cardiac: RRR, Nl S1/S2, -M/R/G.  Pulmonary: CTA bilaterally.  GI: Soft, NT, ND and +BS.  Extremities: -edema and -tenderness.   Labs at discharge Lab Results  Component Value Date   CREATININE 1.19 12/04/2011   BUN 17 12/04/2011   NA 140 12/04/2011   K 3.9 12/04/2011   CL 100 12/04/2011   CO2 29 12/04/2011   Lab Results  Component Value Date   WBC 13.9* 12/06/2011   HGB 11.7* 12/06/2011   HCT 35.0* 12/06/2011   MCV 91.6 12/06/2011   PLT 132* 12/06/2011   Lab Results  Component Value Date   ALT 28 12/04/2011   AST 27 12/04/2011   ALKPHOS 79 12/04/2011   BILITOT 0.3 12/04/2011   Lab Results  Component Value Date   INR 1.07 12/05/2011   INR 1.1 RATIO* 11/12/2007    Current radiology  studies Ct Abdomen Pelvis Wo Contrast  12/04/2011  *RADIOLOGY REPORT*  Clinical Data: Severe back and leg pain  CT ABDOMEN AND PELVIS WITHOUT CONTRAST  Technique:  Multidetector CT imaging of the abdomen and pelvis was performed following the standard protocol without intravenous contrast.  Comparison: 11/13/2007  Findings: The lung bases are clear.  No pericardial or pleural effusion identified.  Within the  limitations of noncontrast technique the liver parenchyma appears intact.  Gallbladder is negative.  There is no biliary dilatation.  The pancreas appears normal.  Normal appearance of both adrenal glands.  The spleen appears normal.  Mild, symmetric perinephric fat stranding is identified and appears unchanged from previous exam.  Low density structures within both kidneys are again noted, compatible with cysts.  These are incompletely characterized on today's exam due to lack of IV contrast.  No upper abdominal adenopathy identified.  There is no pelvic or inguinal adenopathy.  Seed implants identified within the prostate gland.  The urinary bladder appears normal.  The stomach and the small bowel loops are normal.  The proximal colon is normal.  There are multiple diverticula arising from the left colon and sigmoid colon without evidence for acute inflammation.  No free fluid or abnormal fluid collections within the abdomen or pelvis.  Normal caliber of the abdominal aorta.  No aneurysm.  Review of the visualized osseous structures shows evidence of multilevel lumbar degenerative disc disease.  There is a first- degree anterolisthesis of L4 on L5.  IMPRESSION:  1.  No acute findings identified within the abdomen or pelvis.  Original Report Authenticated By: Rosealee Albee, M.D.   Ct Angio Head W/cm &/or Wo Cm  12/04/2011  *RADIOLOGY REPORT*  Clinical Data:  66 year old male with abnormal shaking.  Pain. Flushing.  History of prostate cancer.  Prior cervical spine and brain surgery not otherwise specified.  CT ANGIOGRAPHY HEAD AND NECK  Technique:  Multidetector CT imaging of the head and neck was performed using the standard protocol during bolus administration of intravenous contrast.  Multiplanar CT image reconstructions including MIPs were obtained to evaluate the vascular anatomy. Carotid stenosis measurements (when applicable) are obtained utilizing NASCET criteria, using the distal internal carotid  diameter as the denominator.  Contrast: OMNIPAQUE IOHEXOL 350 MG/ML IV SOLN 100 ml Omnipaque 350.  Comparison:  Cervical MRI 05/11/2005.  Head CT without contrast 08/05/2003.  CTA NECK  Findings:  Negative visualized lung apices.  No superior mediastinal lymphadenopathy.  Negative thyroid, larynx, pharynx, parapharyngeal spaces, retropharyngeal space, sublingual space, submandibular and parotid glands.  Postoperative changes to the globes.  Previous paranasal sinus surgery with residual widespread mucosal thickening.  Mastoids are clear.  Previous anterior and posterior cervical fusion from C3 to T1 including multilevel cervical corpectomy.  Subsequent fusion hardware streak artifact at multiple levels.  No suspicious osseous lesion is identified.  Vascular Findings: Mild arch atherosclerosis.  Great vessel origins are within normal limits.  Suboptimal intraarterial contrast bolus timing in the neck.  Normal right common carotid artery to the level of the bifurcation. .  Soft and calcified plaque occurs at the right carotid bifurcation and involves the right ICA origin and bulb.  Focal intimal flap (series 8 image 77) through the right ICA origin. High-grade right ICA stenosis just beyond this level (image 79). The cervical right ICA remains patent to the skull base.  Right vertebral artery origin largely obscured by hardware and body habitus streak artifact.  The right vertebral artery is patent to the skull  base.  Normal left common carotid artery to the carotid bifurcation. Calcified atherosclerosis occurs at the bifurcation but results in less than 50 % stenosis with respect to the distal vessel.  There is calcified atherosclerosis in the distal cervical left ICA just proximal to the skull base which also does not result in hemodynamically significant stenosis.  The left vertebral artery origin is obscured similar to that on the right.  The left vertebral artery is patent in the neck to the skull base.    Review of the MIP images confirms the above findings.  IMPRESSION: 1. High-grade right ICA origin stenosis related to calcified and soft plaque, and suggestion of ulcerated plaque with focal dissection (series 8 image 77). 2.  Left carotid atherosclerosis without hemodynamically significant stenosis in the neck. 3.  Suboptimal intraarterial contrast bolus timing in the neck. Vertebral artery origins obscured by streak artifact. 4.  See intracranial findings below. 5.  Extensive postoperative changes to the cervical spine.  CTA HEAD  Findings:  Interval paranasal sinus surgery.  Residual mucosal thickening.  Mastoids are clear. No evidence of previous craniotomy.  No suspicious calvarial osseous lesion. Visualized scalp soft tissues are within normal limits.  Stable cerebral volume.  No ventriculomegaly. No midline shift, mass effect, or evidence of mass lesion.  No acute intracranial hemorrhage identified.  No evidence of cortically based acute infarction identified.  No abnormal enhancement identified.  Vascular Findings: Major intracranial venous structures appear to be enhancing.  Suboptimal intraarterial contrast bolus timing. Diminutive distal vertebral arteries are codominant.  The vertebrobasilar junction is patent with calcified atherosclerosis. No definite basilar artery stenosis; the basilar is diminutive owing to bilateral fetal type PCA origins.  Proximal PCAs are patent.  No definite major PCA branch occlusion.  Both ICA siphons are patent with little to no calcified atherosclerosis.  Carotid termini are patent.  MCA and ACA origins are within normal limits.  The anterior communicating artery is diminutive or absent.  Visualized ACA branches are grossly normal.  Both MCA M1 segment are patent without evidence of stenosis.  The visualized bilateral MCA branches are within normal limits.   Review of the MIP images confirms the above findings.  IMPRESSION: 1.  Suboptimal contrast bolus timing.  No proximal  intracranial stenosis or major circle of Willis branch occlusion. 2.  Diminutive posterior circulation on the basis of bilateral fetal PCA origins.  3. Normal for age CT appearance of the brain. 4.  Chronic paranasal sinus disease status post sinus surgery.  Original Report Authenticated By: Harley Hallmark, M.D.   Dg Chest 1 View  12/04/2011  *RADIOLOGY REPORT*  Clinical Data: Back pain and uncontrollable shaking and weakness. History of prostate cancer.  CHEST - 1 VIEW  Comparison: Chest x-ray 08/06/2010.  Findings: Study is limited by lack of visualization of the right costophrenic sulcus.  Lung volumes are lower limits of normal.  No consolidative airspace disease.  No left pleural effusion. Pulmonary vasculature appears slightly crowded, likely related to slightly diminished lung volumes.  No evidence of pulmonary edema. No definite suspicious appearing pulmonary nodules or masses. Heart size is upper limits of normal.  Mediastinal contours are unremarkable.  Orthopedic fixation hardware is incompletely visualized in the lower cervical spine.  IMPRESSION: 1.  No radiographic evidence of acute cardiopulmonary disease.  Original Report Authenticated By: Florencia Reasons, M.D.   Ct Angio Neck W/cm &/or Wo/cm  12/04/2011  *RADIOLOGY REPORT*  Clinical Data:  66 year old male with abnormal shaking.  Pain. Flushing.  History of prostate cancer.  Prior cervical spine and brain surgery not otherwise specified.  CT ANGIOGRAPHY HEAD AND NECK  Technique:  Multidetector CT imaging of the head and neck was performed using the standard protocol during bolus administration of intravenous contrast.  Multiplanar CT image reconstructions including MIPs were obtained to evaluate the vascular anatomy. Carotid stenosis measurements (when applicable) are obtained utilizing NASCET criteria, using the distal internal carotid diameter as the denominator.  Contrast: OMNIPAQUE IOHEXOL 350 MG/ML IV SOLN 100 ml Omnipaque 350.   Comparison:  Cervical MRI 05/11/2005.  Head CT without contrast 08/05/2003.  CTA NECK  Findings:  Negative visualized lung apices.  No superior mediastinal lymphadenopathy.  Negative thyroid, larynx, pharynx, parapharyngeal spaces, retropharyngeal space, sublingual space, submandibular and parotid glands.  Postoperative changes to the globes.  Previous paranasal sinus surgery with residual widespread mucosal thickening.  Mastoids are clear.  Previous anterior and posterior cervical fusion from C3 to T1 including multilevel cervical corpectomy.  Subsequent fusion hardware streak artifact at multiple levels.  No suspicious osseous lesion is identified.  Vascular Findings: Mild arch atherosclerosis.  Great vessel origins are within normal limits.  Suboptimal intraarterial contrast bolus timing in the neck.  Normal right common carotid artery to the level of the bifurcation. .  Soft and calcified plaque occurs at the right carotid bifurcation and involves the right ICA origin and bulb.  Focal intimal flap (series 8 image 77) through the right ICA origin. High-grade right ICA stenosis just beyond this level (image 79). The cervical right ICA remains patent to the skull base.  Right vertebral artery origin largely obscured by hardware and body habitus streak artifact.  The right vertebral artery is patent to the skull base.  Normal left common carotid artery to the carotid bifurcation. Calcified atherosclerosis occurs at the bifurcation but results in less than 50 % stenosis with respect to the distal vessel.  There is calcified atherosclerosis in the distal cervical left ICA just proximal to the skull base which also does not result in hemodynamically significant stenosis.  The left vertebral artery origin is obscured similar to that on the right.  The left vertebral artery is patent in the neck to the skull base.   Review of the MIP images confirms the above findings.  IMPRESSION: 1. High-grade right ICA origin stenosis  related to calcified and soft plaque, and suggestion of ulcerated plaque with focal dissection (series 8 image 77). 2.  Left carotid atherosclerosis without hemodynamically significant stenosis in the neck. 3.  Suboptimal intraarterial contrast bolus timing in the neck. Vertebral artery origins obscured by streak artifact. 4.  See intracranial findings below. 5.  Extensive postoperative changes to the cervical spine.  CTA HEAD  Findings:  Interval paranasal sinus surgery.  Residual mucosal thickening.  Mastoids are clear. No evidence of previous craniotomy.  No suspicious calvarial osseous lesion. Visualized scalp soft tissues are within normal limits.  Stable cerebral volume.  No ventriculomegaly. No midline shift, mass effect, or evidence of mass lesion.  No acute intracranial hemorrhage identified.  No evidence of cortically based acute infarction identified.  No abnormal enhancement identified.  Vascular Findings: Major intracranial venous structures appear to be enhancing.  Suboptimal intraarterial contrast bolus timing. Diminutive distal vertebral arteries are codominant.  The vertebrobasilar junction is patent with calcified atherosclerosis. No definite basilar artery stenosis; the basilar is diminutive owing to bilateral fetal type PCA origins.  Proximal PCAs are patent.  No definite major PCA branch occlusion.  Both ICA siphons  are patent with little to no calcified atherosclerosis.  Carotid termini are patent.  MCA and ACA origins are within normal limits.  The anterior communicating artery is diminutive or absent.  Visualized ACA branches are grossly normal.  Both MCA M1 segment are patent without evidence of stenosis.  The visualized bilateral MCA branches are within normal limits.   Review of the MIP images confirms the above findings.  IMPRESSION: 1.  Suboptimal contrast bolus timing.  No proximal intracranial stenosis or major circle of Willis branch occlusion. 2.  Diminutive posterior circulation on  the basis of bilateral fetal PCA origins.  3. Normal for age CT appearance of the brain. 4.  Chronic paranasal sinus disease status post sinus surgery.  Original Report Authenticated By: Harley Hallmark, M.D.   Ir Angio Intra Extracran Sel Com Carotid Innominate Bilat Mod Sed  12/06/2011  *RADIOLOGY REPORT*  Clinical Data:  Left side shaking.  Right neck pain.  Right internal carotid artery stenosis by CT angiogram.  BILATERAL COMMON CAROTID AND BILATERAL VERTEBRAL ARTERY ANGIOGRAMS  Comparison:  CT angiogram of the head and neck of 12/04/2011.  Following a full explanation of the procedure along with the potential associated complications, an informed witnessed consent was obtained.  The right groin was prepped and draped in the usual sterile fashion.  Thereafter using modified Seldinger technique, transfemoral access into the right common femoral artery was obtained without difficulty.  Over a 0.035-inch guidewire, a 5- [place] Pinnacle sheath was inserted.  Through this and also over a 0.035-inch guidewire, a 5-French JB1 catheter was advanced to the aortic arch region and selectively positioned in the right vertebral artery, the right common carotid artery, the left common carotid artery and the left vertebral artery.  There were no acute complications.  The patient tolerated the procedure well.  Medications utilized: Versed 1 mg IV.  Fentanyl 50 mg IV.  Contrast: Omnipaque-300 approximately 55 ml.  Findings:  The right vertebral artery origin demonstrates approximately 40-50% stenoses.  The vessel otherwise opacifies normally to the cranial skull base.  There is normal opacification of the right posterior-inferior cerebellar artery and the right vertebrobasilar junction.  The opacified portions of the basilar artery, the right posterior cerebral artery, the superior cerebellar arteries and the right anterior-inferior cerebellar artery opacify normally into the capillary and venous phases.  Unopacified blood is  seen in the basilar artery from the contralateral vertebral artery.  The right common carotid arteriogram demonstrates the right external carotid artery and its major branches to be normal.  The right internal carotid artery at the bulb has approximately 75- 80% stenosis secondary to a circumferential plaque.  This is associated with a small ulcerated plaque along the posterior aspect of the bulb.  The right internal carotid artery otherwise opacifies normally to the cranial skull base.  There is normal opacification of the petrous, the cavernous and the supraclinoid segments.  A dominant right posterior communicating artery is seen opacifying the right posterior cerebral artery distributions.  The right middle and the right anterior cerebral arteries opacify normally into the capillary and the venous phases.  The left common carotid arteriogram demonstrates the left external carotid artery and its major branches to be normal.  The left internal carotid artery at the bulb has a smooth shallow plaque just distal to the posterior wall of the bulb.  The left internal carotid artery otherwise opacifies normally to the cranial skull base.  The petrous, the cavernous and the supraclinoid segments are normal.  A  dominant left posterior communicating artery is seen opacifying the left posterior cerebral and superior cerebellar artery distributions.  The left middle and the left anterior cerebral arteries opacify normally into the capillary and venous phases.  The left vertebral artery origin has approximately 60-70% stenosis. Distal to this the vessel opacifies normally to the cranial skull base.  Normal opacification is seen of the left posterior-inferior cerebellar artery and the left vertebrobasilar junction.  The opacified portions of the basilar artery, the left posterior cerebral artery, the superior cerebellar arteries and the left anterior-inferior cerebellar artery opacify normally into the capillary and venous  phases.  Unopacified blood is seen in the basilar artery from the contralateral vertebral artery.  IMPRESSION: 1.  75-80% stenosis of the right internal carotid artery at the bulb secondary to a circumferential atherosclerotic plaque. 2.  60-70% stenosis of the origin of the left vertebral artery. 3.  40-50% stenosis of the origin of the right vertebral artery.  Original Report Authenticated By: Oneal Grout, M.D.   Ir Angio Vertebral Sel Subclavian Innominate Uni L Mod Sed  12/06/2011  *RADIOLOGY REPORT*  Clinical Data:  Left side shaking.  Right neck pain.  Right internal carotid artery stenosis by CT angiogram.  BILATERAL COMMON CAROTID AND BILATERAL VERTEBRAL ARTERY ANGIOGRAMS  Comparison:  CT angiogram of the head and neck of 12/04/2011.  Following a full explanation of the procedure along with the potential associated complications, an informed witnessed consent was obtained.  The right groin was prepped and draped in the usual sterile fashion.  Thereafter using modified Seldinger technique, transfemoral access into the right common femoral artery was obtained without difficulty.  Over a 0.035-inch guidewire, a 5- [place] Pinnacle sheath was inserted.  Through this and also over a 0.035-inch guidewire, a 5-French JB1 catheter was advanced to the aortic arch region and selectively positioned in the right vertebral artery, the right common carotid artery, the left common carotid artery and the left vertebral artery.  There were no acute complications.  The patient tolerated the procedure well.  Medications utilized: Versed 1 mg IV.  Fentanyl 50 mg IV.  Contrast: Omnipaque-300 approximately 55 ml.  Findings:  The right vertebral artery origin demonstrates approximately 40-50% stenoses.  The vessel otherwise opacifies normally to the cranial skull base.  There is normal opacification of the right posterior-inferior cerebellar artery and the right vertebrobasilar junction.  The opacified portions of the  basilar artery, the right posterior cerebral artery, the superior cerebellar arteries and the right anterior-inferior cerebellar artery opacify normally into the capillary and venous phases.  Unopacified blood is seen in the basilar artery from the contralateral vertebral artery.  The right common carotid arteriogram demonstrates the right external carotid artery and its major branches to be normal.  The right internal carotid artery at the bulb has approximately 75- 80% stenosis secondary to a circumferential plaque.  This is associated with a small ulcerated plaque along the posterior aspect of the bulb.  The right internal carotid artery otherwise opacifies normally to the cranial skull base.  There is normal opacification of the petrous, the cavernous and the supraclinoid segments.  A dominant right posterior communicating artery is seen opacifying the right posterior cerebral artery distributions.  The right middle and the right anterior cerebral arteries opacify normally into the capillary and the venous phases.  The left common carotid arteriogram demonstrates the left external carotid artery and its major branches to be normal.  The left internal carotid artery at the bulb has a  smooth shallow plaque just distal to the posterior wall of the bulb.  The left internal carotid artery otherwise opacifies normally to the cranial skull base.  The petrous, the cavernous and the supraclinoid segments are normal.  A dominant left posterior communicating artery is seen opacifying the left posterior cerebral and superior cerebellar artery distributions.  The left middle and the left anterior cerebral arteries opacify normally into the capillary and venous phases.  The left vertebral artery origin has approximately 60-70% stenosis. Distal to this the vessel opacifies normally to the cranial skull base.  Normal opacification is seen of the left posterior-inferior cerebellar artery and the left vertebrobasilar junction.   The opacified portions of the basilar artery, the left posterior cerebral artery, the superior cerebellar arteries and the left anterior-inferior cerebellar artery opacify normally into the capillary and venous phases.  Unopacified blood is seen in the basilar artery from the contralateral vertebral artery.  IMPRESSION: 1.  75-80% stenosis of the right internal carotid artery at the bulb secondary to a circumferential atherosclerotic plaque. 2.  60-70% stenosis of the origin of the left vertebral artery. 3.  40-50% stenosis of the origin of the right vertebral artery.  Original Report Authenticated By: Oneal Grout, M.D.   Ir Angio Vertebral Sel Vertebral Uni R Mod Sed  12/06/2011  *RADIOLOGY REPORT*  Clinical Data:  Left side shaking.  Right neck pain.  Right internal carotid artery stenosis by CT angiogram.  BILATERAL COMMON CAROTID AND BILATERAL VERTEBRAL ARTERY ANGIOGRAMS  Comparison:  CT angiogram of the head and neck of 12/04/2011.  Following a full explanation of the procedure along with the potential associated complications, an informed witnessed consent was obtained.  The right groin was prepped and draped in the usual sterile fashion.  Thereafter using modified Seldinger technique, transfemoral access into the right common femoral artery was obtained without difficulty.  Over a 0.035-inch guidewire, a 5- [place] Pinnacle sheath was inserted.  Through this and also over a 0.035-inch guidewire, a 5-French JB1 catheter was advanced to the aortic arch region and selectively positioned in the right vertebral artery, the right common carotid artery, the left common carotid artery and the left vertebral artery.  There were no acute complications.  The patient tolerated the procedure well.  Medications utilized: Versed 1 mg IV.  Fentanyl 50 mg IV.  Contrast: Omnipaque-300 approximately 55 ml.  Findings:  The right vertebral artery origin demonstrates approximately 40-50% stenoses.  The vessel otherwise  opacifies normally to the cranial skull base.  There is normal opacification of the right posterior-inferior cerebellar artery and the right vertebrobasilar junction.  The opacified portions of the basilar artery, the right posterior cerebral artery, the superior cerebellar arteries and the right anterior-inferior cerebellar artery opacify normally into the capillary and venous phases.  Unopacified blood is seen in the basilar artery from the contralateral vertebral artery.  The right common carotid arteriogram demonstrates the right external carotid artery and its major branches to be normal.  The right internal carotid artery at the bulb has approximately 75- 80% stenosis secondary to a circumferential plaque.  This is associated with a small ulcerated plaque along the posterior aspect of the bulb.  The right internal carotid artery otherwise opacifies normally to the cranial skull base.  There is normal opacification of the petrous, the cavernous and the supraclinoid segments.  A dominant right posterior communicating artery is seen opacifying the right posterior cerebral artery distributions.  The right middle and the right anterior cerebral arteries opacify  normally into the capillary and the venous phases.  The left common carotid arteriogram demonstrates the left external carotid artery and its major branches to be normal.  The left internal carotid artery at the bulb has a smooth shallow plaque just distal to the posterior wall of the bulb.  The left internal carotid artery otherwise opacifies normally to the cranial skull base.  The petrous, the cavernous and the supraclinoid segments are normal.  A dominant left posterior communicating artery is seen opacifying the left posterior cerebral and superior cerebellar artery distributions.  The left middle and the left anterior cerebral arteries opacify normally into the capillary and venous phases.  The left vertebral artery origin has approximately 60-70%  stenosis. Distal to this the vessel opacifies normally to the cranial skull base.  Normal opacification is seen of the left posterior-inferior cerebellar artery and the left vertebrobasilar junction.  The opacified portions of the basilar artery, the left posterior cerebral artery, the superior cerebellar arteries and the left anterior-inferior cerebellar artery opacify normally into the capillary and venous phases.  Unopacified blood is seen in the basilar artery from the contralateral vertebral artery.  IMPRESSION: 1.  75-80% stenosis of the right internal carotid artery at the bulb secondary to a circumferential atherosclerotic plaque. 2.  60-70% stenosis of the origin of the left vertebral artery. 3.  40-50% stenosis of the origin of the right vertebral artery.  Original Report Authenticated By: Oneal Grout, M.D.    Disposition:  Home  Discharge Orders    Future Appointments: Provider: Department: Dept Phone: Center:   12/11/2011 1:00 PM [facility]  [facility]   01/10/2012 10:00 AM [facility] [phone] [facility]     Future Orders Please Complete By Expires   Diet - low sodium heart healthy      Increase activity slowly      Call MD for:  severe uncontrolled pain      Call MD for:  persistant nausea and vomiting      Call MD for:  persistant dizziness or light-headedness      Discharge instructions      Comments:   See your primary doctor in 1 week     Medication List  As of 12/06/2011  2:05 PM   TAKE these medications         allopurinol 300 MG tablet   Commonly known as: ZYLOPRIM   Take 300 mg by mouth at bedtime.      amLODipine 5 MG tablet   Commonly known as: NORVASC   Take 1 tablet (5 mg total) by mouth daily.      aspirin 81 MG EC tablet   Take 162 mg by mouth at bedtime.      cholecalciferol 1000 UNITS tablet   Commonly known as: VITAMIN D   Take 1,000 Units by mouth daily.      clopidogrel 75 MG tablet   Commonly known  as: PLAVIX   Take 1 tablet (75 mg total) by mouth daily with breakfast.      colchicine 0.6 MG tablet   Take 0.6 mg by mouth daily as needed.      diclofenac 75 MG EC tablet   Commonly known as: VOLTAREN   Take 75 mg by mouth 2 (two) times daily. For 1 to 2 weeks routinely      docusate sodium 100 MG capsule   Commonly known as: COLACE   Take 200 mg by mouth at bedtime.  furosemide 40 MG tablet   Commonly known as: LASIX   Take 40 mg by mouth daily. 1 tablet po prn for SOB or swelling      gabapentin 300 MG capsule   Commonly known as: NEURONTIN   Take 300 mg by mouth 3 (three) times daily.      glimepiride 2 MG tablet   Commonly known as: AMARYL   Take 2 mg by mouth 2 (two) times daily.      HYDROcodone-acetaminophen 5-325 MG per tablet   Commonly known as: NORCO   Take 1 tablet by mouth every 6 (six) hours as needed. For pain      insulin detemir 100 UNIT/ML injection   Commonly known as: LEVEMIR   Inject 8 Units into the skin at bedtime.      labetalol 300 MG tablet   Commonly known as: NORMODYNE   Take 300 mg by mouth 2 (two) times daily.      losartan 100 MG tablet   Commonly known as: COZAAR   Take 1 tablet (100 mg total) by mouth daily.      niacin 1000 MG CR tablet   Commonly known as: NIASPAN   Take 2,000 mg by mouth at bedtime.      omega-3 acid ethyl esters 1 G capsule   Commonly known as: LOVAZA   Take 1 g by mouth 2 (two) times daily.      Potassium Gluconate 595 MG Caps   Take 1 capsule by mouth daily.      rosuvastatin 5 MG tablet   Commonly known as: CRESTOR   Take 5 mg by mouth daily.      sitaGLIPtin 100 MG tablet   Commonly known as: JANUVIA   Take 100 mg by mouth daily.      Tamsulosin HCl 0.4 MG Caps   Commonly known as: FLOMAX   Take 0.4 mg by mouth at bedtime.      TYLENOL 500 MG tablet   Generic drug: acetaminophen   Take 500 mg by mouth every 6 (six) hours as needed.           Follow-up Information    Follow up  with [facility], MD in 1 week.      Follow up with Oneal Grout, MD on 12/11/2011. (Please be there at 1245)    Contact information:   [address] [place] [address] [phone]          Discharged Condition: good  Physician Statement:   The Patient was personally examined, the discharge assessment and plan has been personally reviewed and I agree with ACNP Babcock's assessment and plan. > 30 minutes of time have been dedicated to discharge assessment, planning and discharge instructions.   Signed: BABCOCK,PETE 12/06/2011, 2:05 PM  Patient seen and examined, agree with above note.  I dictated the care and orders written for this patient under my direction.  Koren Bound, M.D. [phone]

## 2011-12-06 NOTE — Progress Notes (Signed)
Patient has been set up for formal consultation with Dr. Corliss Skains for angioplasty/stenting procedure on Tuesday 12/11/11 at 1 pm.  Patient to arrive in xray at 1245 pm.  Paper placed in chart to accompany patient upon discharge.

## 2011-12-06 NOTE — Plan of Care (Deleted)
Problem: Consults Goal: Diagnosis - Spinal Surgery Cervical Spine Fusion     

## 2011-12-06 NOTE — Progress Notes (Signed)
Pt. IV d/c by tech. Pt under no s/s distress. D/C instructions provided. Pt under no s/s distress.

## 2011-12-06 NOTE — Progress Notes (Signed)
*  PRELIMINARY RESULTS* Vascular Ultrasound Carotid Duplex (Doppler) has been completed.  Preliminary findings: Right= >80% ICA stenosis with antegrade vertebral flow. Left=No significant ICA stenosis with antegrade vertebral flow.  Elpidio Galea, RDMS, RDCS Farrel Demark, RDMS 12/06/2011, 10:42 AM

## 2011-12-09 LAB — CULTURE, BLOOD (ROUTINE X 2)

## 2011-12-11 ENCOUNTER — Ambulatory Visit (HOSPITAL_COMMUNITY)
Admission: RE | Admit: 2011-12-11 | Discharge: 2011-12-11 | Disposition: A | Discharge status: Home / Self Care | Payer: Medicare Other | Source: Ambulatory Visit | Attending: Interventional Radiology | Admitting: Interventional Radiology

## 2011-12-13 ENCOUNTER — Other Ambulatory Visit (HOSPITAL_COMMUNITY): Payer: Self-pay | Admitting: Interventional Radiology

## 2011-12-13 DIAGNOSIS — H538 Other visual disturbances: Secondary | ICD-10-CM

## 2011-12-13 DIAGNOSIS — I771 Stricture of artery: Secondary | ICD-10-CM

## 2011-12-13 DIAGNOSIS — I6529 Occlusion and stenosis of unspecified carotid artery: Secondary | ICD-10-CM

## 2011-12-20 ENCOUNTER — Ambulatory Visit (HOSPITAL_COMMUNITY)
Admission: RE | Admit: 2011-12-20 | Discharge: 2011-12-20 | Disposition: A | Discharge status: Home / Self Care | Payer: Medicare Other | Source: Ambulatory Visit | Attending: Interventional Radiology | Admitting: Interventional Radiology

## 2011-12-20 DIAGNOSIS — I771 Stricture of artery: Secondary | ICD-10-CM

## 2011-12-20 DIAGNOSIS — J3489 Other specified disorders of nose and nasal sinuses: Secondary | ICD-10-CM | POA: Insufficient documentation

## 2011-12-20 MED ORDER — GADOBENATE DIMEGLUMINE 529 MG/ML IV SOLN: 20.0000 mL | Freq: Once | INTRAVENOUS | Status: AC | PRN

## 2012-01-09 ENCOUNTER — Ambulatory Visit (HOSPITAL_COMMUNITY)
Admission: RE | Admit: 2012-01-09 | Discharge: 2012-01-09 | Disposition: A | Discharge status: Home / Self Care | Payer: Medicare Other | Source: Ambulatory Visit | Attending: Interventional Radiology | Admitting: Interventional Radiology

## 2012-01-09 DIAGNOSIS — I6529 Occlusion and stenosis of unspecified carotid artery: Secondary | ICD-10-CM

## 2012-01-09 DIAGNOSIS — H538 Other visual disturbances: Secondary | ICD-10-CM

## 2012-01-10 ENCOUNTER — Telehealth: Payer: Self-pay | Admitting: Vascular Surgery

## 2012-01-10 NOTE — Telephone Encounter (Signed)
Per message from answering service "can 04/30 145pm- if needed will call back" dated 01/10/12. I called patient in follow up, per patient "I do not want to reschedule at this time, I would like to see my heart doctor in [place] first. I will call back if I feel that I need to see one of your doctors." I canceled the appointment and let the patient know that he would need to call and reschedule if need be. Will route to [place], as we scheduled this per her staff message, dpm

## 2012-01-15 ENCOUNTER — Encounter: Payer: Medicare Other | Admitting: Vascular Surgery

## 2012-02-07 ENCOUNTER — Ambulatory Visit (INDEPENDENT_AMBULATORY_CARE_PROVIDER_SITE_OTHER): Payer: Medicare Other | Admitting: Cardiovascular Disease

## 2012-02-07 ENCOUNTER — Encounter: Payer: Self-pay | Admitting: Cardiovascular Disease

## 2012-02-07 VITALS — BP 170/82 | HR 88 | Ht 66.0 in | Wt 235.0 lb

## 2012-02-07 DIAGNOSIS — E785 Hyperlipidemia, unspecified: Secondary | ICD-10-CM

## 2012-02-07 DIAGNOSIS — E663 Overweight: Secondary | ICD-10-CM

## 2012-02-07 DIAGNOSIS — I739 Peripheral vascular disease, unspecified: Secondary | ICD-10-CM

## 2012-02-07 DIAGNOSIS — I6529 Occlusion and stenosis of unspecified carotid artery: Secondary | ICD-10-CM

## 2012-02-07 DIAGNOSIS — I2510 Atherosclerotic heart disease of native coronary artery without angina pectoris: Secondary | ICD-10-CM

## 2012-02-07 DIAGNOSIS — I10 Essential (primary) hypertension: Secondary | ICD-10-CM

## 2012-02-07 DIAGNOSIS — F17200 Nicotine dependence, unspecified, uncomplicated: Secondary | ICD-10-CM

## 2012-02-07 NOTE — Assessment & Plan Note (Signed)
We'll try to obtain his most recent lipid panel from Dr. Margo Aye. Goal LDL less than 70.

## 2012-02-07 NOTE — Patient Instructions (Signed)
You are doing well. No medication changes were made.  We will schedule you for a carotid ultrasound in 6 months before your visit in 6 months  Please call us if you have new issues that need to be addressed before your next appt.  Your physician wants you to follow-up in: 6 months.  You will receive a reminder letter in the mail two months in advance. If you don't receive a letter, please call our office to schedule the follow-up appointment.

## 2012-02-07 NOTE — Progress Notes (Signed)
Patient ID: [name], male    DOB: Oct 01, 1945, 66 y.o.   MRN: [id number]  HPI Comments: 66 year old male with a history of obstructive sleep apnea, peripheral vascular disease with severe right internal carotid arterial stenosis estimated at 70 to 80%, diabetes, hypertension, hyperlipidemia, obesity, history of cervical spine surgery and prostate cancer with treatment who presents for routine followup. He has a long history of smoking, stopped for 3 years.  He reports having a recent admission to the hospital for possible infection after a tooth was pulled. He had malaise and felt poorly. Blood pressure was very high. He went to the hospital and additional workup included evaluation of his carotid artery on the right. He had ultrasound, CT scan and outpatient angiogram of his carotid showing less than 80% disease. CT scan suggested focal dissection. No plan for intervention at this time per the notes.   Overall he feels well. He denies any complaints of shortness of breath or chest pain. No TIA or neurologic type symptoms. He would like to have his ultrasound workup done in [place] in followup when needed in [place] for surgery. He reports he was supposed to have followup in 3 months time with the surgeon that he would like to only go when it's time for surgery. He continues to take Crestor and denies smoking.   EKG shows normal sinus rhythm with rate of 69 beats per minute, no significant ST or T wave changes.   Outpatient Encounter Prescriptions as of 02/07/2012  Medication Sig Dispense Refill  . acetaminophen (TYLENOL) 500 MG tablet Take 500 mg by mouth every 6 (six) hours as needed.        Marland Kitchen allopurinol (ZYLOPRIM) 300 MG tablet Take 300 mg by mouth at bedtime.       Marland Kitchen amLODipine (NORVASC) 5 MG tablet Take 1 tablet (5 mg total) by mouth daily.  30 tablet  6  . aspirin 81 MG EC tablet Take 162 mg by mouth at bedtime.       . cholecalciferol (VITAMIN D) 1000 UNITS tablet Take 1,000  Units by mouth daily.      . clopidogrel (PLAVIX) 75 MG tablet Take 1 tablet (75 mg total) by mouth daily with breakfast.  30 tablet  6  . colchicine 0.6 MG tablet Take 0.6 mg by mouth daily as needed.        . diclofenac (VOLTAREN) 75 MG EC tablet Take 75 mg by mouth 2 (two) times daily. For 1 to 2 weeks routinely      . docusate sodium (COLACE) 100 MG capsule Take 200 mg by mouth at bedtime.      . furosemide (LASIX) 40 MG tablet Take 40 mg by mouth daily. 1 tablet po prn for SOB or swelling       . gabapentin (NEURONTIN) 300 MG capsule Take 300 mg by mouth 3 (three) times daily.        Marland Kitchen glimepiride (AMARYL) 2 MG tablet Take 2 mg by mouth 2 (two) times daily.        Marland Kitchen HYDROcodone-acetaminophen (NORCO) 5-325 MG per tablet Take 1 tablet by mouth every 6 (six) hours as needed. For pain      . insulin detemir (LEVEMIR) 100 UNIT/ML injection Inject 8 Units into the skin at bedtime.       Marland Kitchen labetalol (NORMODYNE) 300 MG tablet Take 300 mg by mouth 2 (two) times daily.        Marland Kitchen losartan (COZAAR) 100 MG tablet Take  1 tablet (100 mg total) by mouth daily.  30 tablet  6  . niacin (NIASPAN) 1000 MG CR tablet Take 2,000 mg by mouth at bedtime.       Marland Kitchen omega-3 acid ethyl esters (LOVAZA) 1 G capsule Take 1 g by mouth 2 (two) times daily.       . Potassium Gluconate 595 MG CAPS Take 1 capsule by mouth daily.      . rosuvastatin (CRESTOR) 10 MG tablet Take 10 mg by mouth daily.      . sitaGLIPtan (JANUVIA) 100 MG tablet Take 100 mg by mouth daily.        . Tamsulosin HCl (FLOMAX) 0.4 MG CAPS Take 0.4 mg by mouth at bedtime.       Review of Systems  Constitutional: Negative.   HENT: Negative.   Eyes: Negative.   Respiratory: Negative.   Cardiovascular: Positive for leg swelling.  Gastrointestinal: Negative.   Musculoskeletal: Positive for back pain.  Skin: Negative.   Neurological: Negative.   Hematological: Negative.   Psychiatric/Behavioral: Negative.   All other systems reviewed and are  negative.   BP 170/82  Pulse 88  Ht 5\' 6"  (1.676 m)  Wt 235 lb (106.595 kg)  BMI 37.93 kg/m2 Repeat blood pressure 138/80  Physical Exam  Nursing note and vitals reviewed. Constitutional: He is oriented to person, place, and time. He appears well-developed and well-nourished.  HENT:  Head: Normocephalic.  Right Ear: External ear normal.  Left Ear: External ear normal.  Nose: Nose normal.  Mouth/Throat: Oropharynx is clear and moist.  Eyes: Conjunctivae are normal. Pupils are equal, round, and reactive to light. Right eye exhibits no discharge. No scleral icterus.  Neck: Normal range of motion. Neck supple. No JVD present. Carotid bruit is present.  Cardiovascular: Normal rate, regular rhythm, S1 normal, S2 normal and intact distal pulses.  Exam reveals no gallop and no friction rub.   Murmur heard.  Crescendo systolic murmur is present with a grade of 2/6  Pulmonary/Chest: Effort normal and breath sounds normal. No respiratory distress. He has no wheezes. He has no rales. He exhibits no tenderness.  Abdominal: Soft. Bowel sounds are normal. He exhibits no distension. There is no tenderness.  Musculoskeletal: Normal range of motion. He exhibits no edema and no tenderness.  Lymphadenopathy:    He has no cervical adenopathy.  Neurological: He is alert and oriented to person, place, and time. Coordination normal.  Skin: Skin is warm and dry. No rash noted. No erythema.  Psychiatric: He has a normal mood and affect. His behavior is normal. Judgment and thought content normal.           Assessment and Plan

## 2012-02-07 NOTE — Assessment & Plan Note (Signed)
Severe right carotid arterial disease estimated 75-80%. Repeat ultrasound scheduled through our office in 6 months time. He does not want to have immediate followup in [place] and would like to have his ultrasound done through our office. We have encouraged him to followup closely with vascular surgery. He states that he will "followup when it is time for surgery, not just for a visit when they are not going to operate".

## 2012-02-07 NOTE — Assessment & Plan Note (Signed)
He reports that he is stop smoking.

## 2012-02-07 NOTE — Assessment & Plan Note (Signed)
Repeat blood pressure was improved. We have asked him to monitor his blood pressure at home.

## 2012-02-07 NOTE — Assessment & Plan Note (Signed)
We have encouraged continued exercise, careful diet management in an effort to lose weight. 

## 2012-02-26 ENCOUNTER — Other Ambulatory Visit: Payer: Self-pay | Admitting: Neurosurgery

## 2012-02-26 DIAGNOSIS — M549 Dorsalgia, unspecified: Secondary | ICD-10-CM

## 2012-03-05 ENCOUNTER — Ambulatory Visit
Admission: RE | Admit: 2012-03-05 | Discharge: 2012-03-05 | Disposition: A | Discharge status: Home / Self Care | Payer: Medicare Other | Source: Ambulatory Visit | Attending: Neurosurgery | Admitting: Neurosurgery

## 2012-03-05 VITALS — BP 140/66 | HR 72

## 2012-03-05 DIAGNOSIS — M549 Dorsalgia, unspecified: Secondary | ICD-10-CM

## 2012-03-05 MED ORDER — DIAZEPAM 5 MG PO TABS
5.0000 mg | ORAL_TABLET | Freq: Once | ORAL | Status: AC
  Administered 2012-03-05: 5 mg via ORAL

## 2012-03-05 MED ORDER — MEPERIDINE HCL 100 MG/ML IJ SOLN
75.0000 mg | Freq: Once | INTRAMUSCULAR | Status: AC
  Administered 2012-03-05: 75 mg via INTRAMUSCULAR

## 2012-03-05 MED ORDER — IOHEXOL 180 MG/ML  SOLN
15.0000 mL | Freq: Once | INTRAMUSCULAR | Status: AC | PRN
  Administered 2012-03-05: 15 mL via INTRATHECAL

## 2012-03-05 MED ORDER — ONDANSETRON HCL 4 MG/2ML IJ SOLN
4.0000 mg | Freq: Once | INTRAMUSCULAR | Status: AC
  Administered 2012-03-05: 4 mg via INTRAMUSCULAR

## 2012-03-05 MED ORDER — ONDANSETRON HCL 4 MG/2ML IJ SOLN: 4.0000 mg | Freq: Four times a day (QID) | INTRAMUSCULAR | Status: DC | PRN

## 2012-03-05 NOTE — Progress Notes (Addendum)
Pt states he's been off plavix for over 5 days.  4098 pt c/o back and leg pain, pain meds were given.dd

## 2012-03-05 NOTE — Discharge Instructions (Signed)
Myelogram Discharge Instructions  1. Go home and rest quietly for the next 24 hours.  It is important to lie flat for the next 24 hours.  Get up only to go to the restroom.  You may lie in the bed or on a couch on your back, your stomach, your left side or your right side.  You may have one pillow under your head.  You may have pillows between your knees while you are on your side or under your knees while you are on your back.  2. DO NOT drive today.  Recline the seat as far back as it will go, while still wearing your seat belt, on the way home.  3. You may get up to go to the bathroom as needed.  You may sit up for 10 minutes to eat.  You may resume your normal diet and medications unless otherwise indicated.  Drink lots of extra fluids today and tomorrow.  4. The incidence of headache, nausea, or vomiting is about 5% (one in 20 patients).  If you develop a headache, lie flat and drink plenty of fluids until the headache goes away.  Caffeinated beverages may be helpful.  If you develop severe nausea and vomiting or a headache that does not go away with flat bed rest, call [phone].  5. You may resume normal activities after your 24 hours of bed rest is over; however, do not exert yourself strongly or do any heavy lifting tomorrow. If when you get up you have a headache when standing, go back to bed and force fluids for another 24 hours.  6. Call your physician for a follow-up appointment.  The results of your myelogram will be sent directly to your physician by the following day.  7. If you have any questions or if complications develop after you arrive home, please call [phone].  Discharge instructions have been explained to the patient.  The patient, or the person responsible for the patient, fully understands these instructions.       May resume plavix today. 

## 2012-03-14 ENCOUNTER — Other Ambulatory Visit: Payer: Self-pay | Admitting: Neurosurgery

## 2012-03-24 ENCOUNTER — Encounter (HOSPITAL_COMMUNITY)
Admission: RE | Admit: 2012-03-24 | Discharge: 2012-03-24 | Disposition: A | Discharge status: Home / Self Care | Payer: Medicare Other | Source: Ambulatory Visit | Attending: Neurosurgery | Admitting: Neurosurgery

## 2012-03-24 ENCOUNTER — Encounter (HOSPITAL_COMMUNITY): Payer: Self-pay

## 2012-03-24 ENCOUNTER — Encounter (HOSPITAL_COMMUNITY): Payer: Self-pay | Admitting: Pharmacy Technician

## 2012-03-24 LAB — CBC
HCT: 39.1 % (ref 39.0–52.0)
Hemoglobin: 13.3 g/dL (ref 13.0–17.0)
MCV: 89.7 fL (ref 78.0–100.0)
Platelets: 159 10*3/uL (ref 150–400)
RBC: 4.36 MIL/uL (ref 4.22–5.81)
WBC: 7.1 10*3/uL (ref 4.0–10.5)

## 2012-03-24 LAB — BASIC METABOLIC PANEL
BUN: 15 mg/dL (ref 6–23)
CO2: 27 mEq/L (ref 19–32)
Chloride: 105 mEq/L (ref 96–112)
Creatinine, Ser: 1.08 mg/dL (ref 0.50–1.35)

## 2012-03-24 LAB — SURGICAL PCR SCREEN: MRSA, PCR: NEGATIVE

## 2012-03-24 NOTE — Pre-Procedure Instructions (Signed)
19 [name]  03/24/2012   Your procedure is scheduled on:  03/26/12  Report to [facility] at 630 AM.  Call this number if you have problems the morning of surgery: [phone]   Remember:   Do not eat food:After Midnight.  May have clear liquids:until Midnight .  Clear liquids include soda, tea, black coffee, apple or grape juice, broth.  Take these medicines the morning of surgery with A SIP OF WATER: norvasc,colchine,neurotin,hydrocodone,flomax   Do not wear jewelry, make-up or nail polish.  Do not wear lotions, powders, or perfumes. You may wear deodorant.  Do not shave 48 hours prior to surgery. Men may shave face and neck.  Do not bring valuables to the hospital.  Contacts, dentures or bridgework may not be worn into surgery.  Leave suitcase in the car. After surgery it may be brought to your room.  For patients admitted to the hospital, checkout time is 11:00 AM the day of discharge.   Patients discharged the day of surgery will not be allowed to drive home.  Name and phone number of your driver: family  Special Instructions: CHG Shower Use Special Wash: 1/2 bottle night before surgery and 1/2 bottle morning of surgery.   Please read over the following fact sheets that you were given: Pain Booklet, Coughing and Deep Breathing, Blood Transfusion Information, MRSA Information and Surgical Site Infection Prevention

## 2012-03-24 NOTE — Progress Notes (Signed)
Stress test  08

## 2012-03-24 NOTE — Progress Notes (Signed)
Pt stared there was no cardiac studies done.  Sleep study >5 yrs

## 2012-03-25 MED ORDER — CEFAZOLIN SODIUM-DEXTROSE 2-3 GM-% IV SOLR
2.0000 g | INTRAVENOUS | Status: AC
  Administered 2012-03-26: 2 g via INTRAVENOUS
  Filled 2012-03-25: qty 50

## 2012-03-26 ENCOUNTER — Inpatient Hospital Stay (HOSPITAL_COMMUNITY): Payer: Medicare Other | Admitting: Certified Registered"

## 2012-03-26 ENCOUNTER — Encounter (HOSPITAL_COMMUNITY): Payer: Self-pay | Admitting: Certified Registered"

## 2012-03-26 ENCOUNTER — Inpatient Hospital Stay (HOSPITAL_COMMUNITY): Payer: Medicare Other

## 2012-03-26 ENCOUNTER — Encounter (HOSPITAL_COMMUNITY)
Admission: RE | Disposition: A | Discharge status: Skilled Nursing Facility | Payer: Self-pay | Source: Ambulatory Visit | Attending: Neurosurgery

## 2012-03-26 ENCOUNTER — Inpatient Hospital Stay (HOSPITAL_COMMUNITY)
Admission: RE | Admit: 2012-03-26 | Discharge: 2012-04-04 | DRG: 460 | Disposition: A | Discharge status: Skilled Nursing Facility | Payer: Medicare Other | Source: Ambulatory Visit | Attending: Neurosurgery | Admitting: Neurosurgery

## 2012-03-26 ENCOUNTER — Encounter (HOSPITAL_COMMUNITY): Payer: Self-pay

## 2012-03-26 DIAGNOSIS — R079 Chest pain, unspecified: Secondary | ICD-10-CM | POA: Diagnosis present

## 2012-03-26 DIAGNOSIS — Z87891 Personal history of nicotine dependence: Secondary | ICD-10-CM

## 2012-03-26 DIAGNOSIS — R0789 Other chest pain: Secondary | ICD-10-CM | POA: Diagnosis not present

## 2012-03-26 DIAGNOSIS — M48061 Spinal stenosis, lumbar region without neurogenic claudication: Secondary | ICD-10-CM | POA: Diagnosis present

## 2012-03-26 DIAGNOSIS — Z7982 Long term (current) use of aspirin: Secondary | ICD-10-CM

## 2012-03-26 DIAGNOSIS — I2510 Atherosclerotic heart disease of native coronary artery without angina pectoris: Secondary | ICD-10-CM | POA: Diagnosis present

## 2012-03-26 DIAGNOSIS — M51379 Other intervertebral disc degeneration, lumbosacral region without mention of lumbar back pain or lower extremity pain: Principal | ICD-10-CM | POA: Diagnosis present

## 2012-03-26 DIAGNOSIS — M5137 Other intervertebral disc degeneration, lumbosacral region: Principal | ICD-10-CM | POA: Diagnosis present

## 2012-03-26 DIAGNOSIS — M48062 Spinal stenosis, lumbar region with neurogenic claudication: Secondary | ICD-10-CM | POA: Diagnosis present

## 2012-03-26 DIAGNOSIS — Z8546 Personal history of malignant neoplasm of prostate: Secondary | ICD-10-CM

## 2012-03-26 DIAGNOSIS — G4733 Obstructive sleep apnea (adult) (pediatric): Secondary | ICD-10-CM | POA: Diagnosis present

## 2012-03-26 DIAGNOSIS — I10 Essential (primary) hypertension: Secondary | ICD-10-CM | POA: Diagnosis present

## 2012-03-26 DIAGNOSIS — Z8551 Personal history of malignant neoplasm of bladder: Secondary | ICD-10-CM

## 2012-03-26 DIAGNOSIS — E119 Type 2 diabetes mellitus without complications: Secondary | ICD-10-CM | POA: Diagnosis present

## 2012-03-26 DIAGNOSIS — D649 Anemia, unspecified: Secondary | ICD-10-CM | POA: Diagnosis present

## 2012-03-26 DIAGNOSIS — I739 Peripheral vascular disease, unspecified: Secondary | ICD-10-CM | POA: Diagnosis present

## 2012-03-26 DIAGNOSIS — D62 Acute posthemorrhagic anemia: Secondary | ICD-10-CM | POA: Diagnosis not present

## 2012-03-26 HISTORY — PX: LUMBAR FUSION: SHX111

## 2012-03-26 LAB — TYPE AND SCREEN
Antibody Screen: NEGATIVE
Antibody Screen: NEGATIVE

## 2012-03-26 LAB — GLUCOSE, CAPILLARY: Glucose-Capillary: 115 mg/dL — ABNORMAL HIGH (ref 70–99)

## 2012-03-26 SURGERY — POSTERIOR LUMBAR FUSION 3 LEVEL
Anesthesia: General | Site: Back | Wound class: Clean

## 2012-03-26 MED ORDER — FUROSEMIDE 40 MG PO TABS
40.0000 mg | ORAL_TABLET | Freq: Every day | ORAL | Status: DC
  Administered 2012-03-27 – 2012-04-04 (×9): 40 mg via ORAL
  Filled 2012-03-26 (×9): qty 1

## 2012-03-26 MED ORDER — DIAZEPAM 5 MG PO TABS
ORAL_TABLET | ORAL | Status: AC
  Filled 2012-03-26: qty 1

## 2012-03-26 MED ORDER — MORPHINE SULFATE (PF) 1 MG/ML IV SOLN
INTRAVENOUS | Status: AC
  Filled 2012-03-26: qty 25

## 2012-03-26 MED ORDER — PROPOFOL 10 MG/ML IV EMUL
INTRAVENOUS | Status: DC | PRN
  Administered 2012-03-26: 170 mg via INTRAVENOUS

## 2012-03-26 MED ORDER — TAMSULOSIN HCL 0.4 MG PO CAPS
0.4000 mg | ORAL_CAPSULE | Freq: Every day | ORAL | Status: DC
  Administered 2012-03-26 – 2012-04-03 (×9): 0.4 mg via ORAL
  Filled 2012-03-26 (×11): qty 1

## 2012-03-26 MED ORDER — ACETAMINOPHEN 325 MG PO TABS: 650.0000 mg | ORAL_TABLET | ORAL | Status: DC | PRN

## 2012-03-26 MED ORDER — ACETAMINOPHEN 10 MG/ML IV SOLN
INTRAVENOUS | Status: AC
  Filled 2012-03-26: qty 100

## 2012-03-26 MED ORDER — MORPHINE SULFATE (PF) 1 MG/ML IV SOLN
INTRAVENOUS | Status: DC
  Administered 2012-03-26: 22 mg via INTRAVENOUS
  Administered 2012-03-26 (×2): via INTRAVENOUS
  Administered 2012-03-26: 3.53 mg via INTRAVENOUS
  Administered 2012-03-27: 15 mg via INTRAVENOUS
  Administered 2012-03-27: 1 mg via INTRAVENOUS
  Administered 2012-03-27: 5 mg via INTRAVENOUS
  Administered 2012-03-27 – 2012-03-28 (×2): 3 mg via INTRAVENOUS
  Filled 2012-03-26 (×2): qty 25

## 2012-03-26 MED ORDER — SODIUM CHLORIDE 0.9 % IV SOLN
INTRAVENOUS | Status: DC | PRN
  Administered 2012-03-26: 13:00:00 via INTRAVENOUS

## 2012-03-26 MED ORDER — GLYCOPYRROLATE 0.2 MG/ML IJ SOLN
INTRAMUSCULAR | Status: DC | PRN
  Administered 2012-03-26: 0.6 mg via INTRAVENOUS

## 2012-03-26 MED ORDER — HEMOSTATIC AGENTS (NO CHARGE) OPTIME
TOPICAL | Status: DC | PRN
  Administered 2012-03-26: 1 via TOPICAL

## 2012-03-26 MED ORDER — ACETAMINOPHEN 650 MG RE SUPP: 650.0000 mg | RECTAL | Status: DC | PRN

## 2012-03-26 MED ORDER — 0.9 % SODIUM CHLORIDE (POUR BTL) OPTIME
TOPICAL | Status: DC | PRN
  Administered 2012-03-26: 1000 mL

## 2012-03-26 MED ORDER — PHENYLEPHRINE HCL 10 MG/ML IJ SOLN
10.0000 mg | INTRAVENOUS | Status: DC | PRN
  Administered 2012-03-26: 25 ug/min via INTRAVENOUS

## 2012-03-26 MED ORDER — VECURONIUM BROMIDE 10 MG IV SOLR
INTRAVENOUS | Status: DC | PRN
  Administered 2012-03-26: 5 mg via INTRAVENOUS
  Administered 2012-03-26 (×2): 1 mg via INTRAVENOUS

## 2012-03-26 MED ORDER — AMLODIPINE BESYLATE 5 MG PO TABS
5.0000 mg | ORAL_TABLET | Freq: Every day | ORAL | Status: DC
  Administered 2012-03-27 – 2012-04-03 (×8): 5 mg via ORAL
  Filled 2012-03-26 (×9): qty 1

## 2012-03-26 MED ORDER — LABETALOL HCL 300 MG PO TABS
300.0000 mg | ORAL_TABLET | Freq: Two times a day (BID) | ORAL | Status: DC
  Administered 2012-03-26 – 2012-04-04 (×17): 300 mg via ORAL
  Filled 2012-03-26 (×20): qty 1

## 2012-03-26 MED ORDER — DIAZEPAM 5 MG PO TABS
5.0000 mg | ORAL_TABLET | Freq: Four times a day (QID) | ORAL | Status: DC | PRN
  Administered 2012-03-26 – 2012-04-01 (×13): 5 mg via ORAL
  Filled 2012-03-26 (×12): qty 1

## 2012-03-26 MED ORDER — SODIUM CHLORIDE 0.9 % IJ SOLN: 3.0000 mL | INTRAMUSCULAR | Status: DC | PRN

## 2012-03-26 MED ORDER — OXYCODONE-ACETAMINOPHEN 5-325 MG PO TABS
1.0000 | ORAL_TABLET | ORAL | Status: DC | PRN
  Administered 2012-03-26 – 2012-03-28 (×4): 2 via ORAL
  Filled 2012-03-26 (×3): qty 2

## 2012-03-26 MED ORDER — ALBUMIN HUMAN 5 % IV SOLN
INTRAVENOUS | Status: DC | PRN
  Administered 2012-03-26: 13:00:00 via INTRAVENOUS

## 2012-03-26 MED ORDER — ACETAMINOPHEN 10 MG/ML IV SOLN
INTRAVENOUS | Status: DC | PRN
  Administered 2012-03-26: 1000 mg via INTRAVENOUS

## 2012-03-26 MED ORDER — MIDAZOLAM HCL 5 MG/5ML IJ SOLN
INTRAMUSCULAR | Status: DC | PRN
  Administered 2012-03-26: 2 mg via INTRAVENOUS

## 2012-03-26 MED ORDER — EPHEDRINE SULFATE 50 MG/ML IJ SOLN
INTRAMUSCULAR | Status: DC | PRN
  Administered 2012-03-26 (×3): 10 mg via INTRAVENOUS
  Administered 2012-03-26 (×2): 5 mg via INTRAVENOUS

## 2012-03-26 MED ORDER — HYDROMORPHONE HCL PF 1 MG/ML IJ SOLN
0.2500 mg | INTRAMUSCULAR | Status: DC | PRN
  Administered 2012-03-26 (×4): 0.5 mg via INTRAVENOUS

## 2012-03-26 MED ORDER — HYDROMORPHONE HCL PF 1 MG/ML IJ SOLN
INTRAMUSCULAR | Status: AC
  Filled 2012-03-26: qty 1

## 2012-03-26 MED ORDER — THROMBIN 20000 UNITS EX KIT
PACK | CUTANEOUS | Status: DC | PRN
  Administered 2012-03-26: 10:00:00 via TOPICAL

## 2012-03-26 MED ORDER — NALOXONE HCL 0.4 MG/ML IJ SOLN: 0.4000 mg | INTRAMUSCULAR | Status: DC | PRN

## 2012-03-26 MED ORDER — ONDANSETRON HCL 4 MG/2ML IJ SOLN
4.0000 mg | Freq: Four times a day (QID) | INTRAMUSCULAR | Status: DC | PRN
  Filled 2012-03-26: qty 2

## 2012-03-26 MED ORDER — LOSARTAN POTASSIUM 50 MG PO TABS
100.0000 mg | ORAL_TABLET | Freq: Every day | ORAL | Status: DC
  Administered 2012-03-27 – 2012-04-03 (×8): 100 mg via ORAL
  Filled 2012-03-26 (×8): qty 2

## 2012-03-26 MED ORDER — MENTHOL 3 MG MT LOZG
1.0000 | LOZENGE | OROMUCOSAL | Status: DC | PRN
  Filled 2012-03-26: qty 9

## 2012-03-26 MED ORDER — ONDANSETRON HCL 4 MG/2ML IJ SOLN
INTRAMUSCULAR | Status: DC | PRN
  Administered 2012-03-26: 4 mg via INTRAVENOUS

## 2012-03-26 MED ORDER — ONDANSETRON HCL 4 MG/2ML IJ SOLN: 4.0000 mg | Freq: Four times a day (QID) | INTRAMUSCULAR | Status: DC | PRN

## 2012-03-26 MED ORDER — LOSARTAN POTASSIUM 50 MG PO TABS: 100.0000 mg | ORAL_TABLET | Freq: Every day | ORAL | Status: DC

## 2012-03-26 MED ORDER — ATORVASTATIN CALCIUM 10 MG PO TABS
10.0000 mg | ORAL_TABLET | Freq: Every day | ORAL | Status: DC
  Administered 2012-03-26 – 2012-04-03 (×9): 10 mg via ORAL
  Filled 2012-03-26 (×10): qty 1

## 2012-03-26 MED ORDER — OXYCODONE-ACETAMINOPHEN 5-325 MG PO TABS
ORAL_TABLET | ORAL | Status: AC
  Filled 2012-03-26: qty 2

## 2012-03-26 MED ORDER — ZOLPIDEM TARTRATE 5 MG PO TABS: 5.0000 mg | ORAL_TABLET | Freq: Every evening | ORAL | Status: DC | PRN

## 2012-03-26 MED ORDER — GLIMEPIRIDE 2 MG PO TABS
2.0000 mg | ORAL_TABLET | Freq: Two times a day (BID) | ORAL | Status: DC
  Administered 2012-03-26 – 2012-04-04 (×18): 2 mg via ORAL
  Filled 2012-03-26 (×22): qty 1

## 2012-03-26 MED ORDER — PHENOL 1.4 % MT LIQD
1.0000 | OROMUCOSAL | Status: DC | PRN
  Filled 2012-03-26: qty 177

## 2012-03-26 MED ORDER — HETASTARCH-ELECTROLYTES 6 % IV SOLN
INTRAVENOUS | Status: DC | PRN
  Administered 2012-03-26: 11:00:00 via INTRAVENOUS

## 2012-03-26 MED ORDER — SODIUM CHLORIDE 0.9 % IJ SOLN: 9.0000 mL | INTRAMUSCULAR | Status: DC | PRN

## 2012-03-26 MED ORDER — SODIUM CHLORIDE 0.9 % IV SOLN: 250.0000 mL | INTRAVENOUS | Status: DC

## 2012-03-26 MED ORDER — ALLOPURINOL 300 MG PO TABS
300.0000 mg | ORAL_TABLET | Freq: Every day | ORAL | Status: DC
  Administered 2012-03-26 – 2012-04-03 (×9): 300 mg via ORAL
  Filled 2012-03-26 (×11): qty 1

## 2012-03-26 MED ORDER — DIPHENHYDRAMINE HCL 12.5 MG/5ML PO ELIX: 12.5000 mg | ORAL_SOLUTION | Freq: Four times a day (QID) | ORAL | Status: DC | PRN

## 2012-03-26 MED ORDER — ROCURONIUM BROMIDE 100 MG/10ML IV SOLN
INTRAVENOUS | Status: DC | PRN
  Administered 2012-03-26: 30 mg via INTRAVENOUS
  Administered 2012-03-26: 70 mg via INTRAVENOUS

## 2012-03-26 MED ORDER — DIPHENHYDRAMINE HCL 50 MG/ML IJ SOLN: 12.5000 mg | Freq: Four times a day (QID) | INTRAMUSCULAR | Status: DC | PRN

## 2012-03-26 MED ORDER — SODIUM CHLORIDE 0.9 % IV SOLN
INTRAVENOUS | Status: DC
  Administered 2012-03-26 – 2012-03-28 (×4): via INTRAVENOUS

## 2012-03-26 MED ORDER — ONDANSETRON HCL 4 MG/2ML IJ SOLN
4.0000 mg | INTRAMUSCULAR | Status: DC | PRN
  Administered 2012-03-27: 4 mg via INTRAVENOUS

## 2012-03-26 MED ORDER — GABAPENTIN 300 MG PO CAPS
300.0000 mg | ORAL_CAPSULE | Freq: Three times a day (TID) | ORAL | Status: DC
  Administered 2012-03-26 – 2012-04-04 (×26): 300 mg via ORAL
  Filled 2012-03-26 (×30): qty 1

## 2012-03-26 MED ORDER — PHENYLEPHRINE HCL 10 MG/ML IJ SOLN
INTRAMUSCULAR | Status: DC | PRN
Start: 2012-03-26 — End: 2012-03-26
  Administered 2012-03-26 (×3): 80 ug via INTRAVENOUS

## 2012-03-26 MED ORDER — CEFAZOLIN SODIUM 1-5 GM-% IV SOLN
1.0000 g | Freq: Three times a day (TID) | INTRAVENOUS | Status: AC
  Administered 2012-03-26 – 2012-03-27 (×2): 1 g via INTRAVENOUS
  Filled 2012-03-26 (×2): qty 50

## 2012-03-26 MED ORDER — LACTATED RINGERS IV SOLN
INTRAVENOUS | Status: DC | PRN
  Administered 2012-03-26 (×3): via INTRAVENOUS

## 2012-03-26 MED ORDER — HEPARIN SODIUM (PORCINE) 1000 UNIT/ML IJ SOLN
INTRAMUSCULAR | Status: AC
  Filled 2012-03-26: qty 1

## 2012-03-26 MED ORDER — SODIUM CHLORIDE 0.9 % IJ SOLN
3.0000 mL | Freq: Two times a day (BID) | INTRAMUSCULAR | Status: DC
  Administered 2012-03-26 – 2012-03-27 (×2): 3 mL via INTRAVENOUS

## 2012-03-26 MED ORDER — LIDOCAINE HCL (CARDIAC) 20 MG/ML IV SOLN
INTRAVENOUS | Status: DC | PRN
  Administered 2012-03-26: 80 mg via INTRAVENOUS

## 2012-03-26 MED ORDER — NEOSTIGMINE METHYLSULFATE 1 MG/ML IJ SOLN
INTRAMUSCULAR | Status: DC | PRN
  Administered 2012-03-26: 4 mg via INTRAVENOUS

## 2012-03-26 MED ORDER — KETOROLAC TROMETHAMINE 30 MG/ML IJ SOLN
30.0000 mg | Freq: Four times a day (QID) | INTRAMUSCULAR | Status: AC | PRN
  Administered 2012-03-26 – 2012-03-30 (×9): 30 mg via INTRAVENOUS
  Filled 2012-03-26 (×9): qty 1

## 2012-03-26 MED ORDER — LINAGLIPTIN 5 MG PO TABS
5.0000 mg | ORAL_TABLET | Freq: Every day | ORAL | Status: DC
  Administered 2012-03-27 – 2012-04-04 (×9): 5 mg via ORAL
  Filled 2012-03-26 (×9): qty 1

## 2012-03-26 MED ORDER — FENTANYL CITRATE 0.05 MG/ML IJ SOLN
INTRAMUSCULAR | Status: DC | PRN
  Administered 2012-03-26: 50 ug via INTRAVENOUS
  Administered 2012-03-26: 100 ug via INTRAVENOUS
  Administered 2012-03-26 (×4): 50 ug via INTRAVENOUS

## 2012-03-26 SURGICAL SUPPLY — 76 items
0.5% MARCAINE W/ EPI 1:200,000 IMPLANT
BENZOIN TINCTURE PRP APPL 2/3 (GAUZE/BANDAGES/DRESSINGS) ×4 IMPLANT
BLADE SURG ROTATE 9660 (MISCELLANEOUS) ×2 IMPLANT
BUR ACORN 6.0 (BURR) ×4 IMPLANT
BUR MATCHSTICK NEURO 3.0 LAGG (BURR) ×2 IMPLANT
CANISTER SUCTION 2500CC (MISCELLANEOUS) ×2 IMPLANT
CAP REVERE LOCKING (Cap) ×16 IMPLANT
CLOTH BEACON ORANGE TIMEOUT ST (SAFETY) ×2 IMPLANT
CONN CROSSLINK REV 6.35 48-60 (Connector) ×2 IMPLANT
CONNECTOR CRSLNK REV6.35 48-60 (Connector) ×1 IMPLANT
CONT SPEC 4OZ CLIKSEAL STRL BL (MISCELLANEOUS) ×4 IMPLANT
COVER BACK TABLE 24X17X13 BIG (DRAPES) IMPLANT
COVER TABLE BACK 60X90 (DRAPES) ×2 IMPLANT
DRAPE C-ARM 42X72 X-RAY (DRAPES) ×4 IMPLANT
DRAPE LAPAROTOMY 100X72X124 (DRAPES) ×2 IMPLANT
DRAPE POUCH INSTRU U-SHP 10X18 (DRAPES) ×2 IMPLANT
DRSG PAD ABDOMINAL 8X10 ST (GAUZE/BANDAGES/DRESSINGS) ×4 IMPLANT
DURAPREP 26ML APPLICATOR (WOUND CARE) ×2 IMPLANT
ELECT BLADE 4.0 EZ CLEAN MEGAD (MISCELLANEOUS) ×2
ELECT REM PT RETURN 9FT ADLT (ELECTROSURGICAL) ×2
ELECTRODE BLDE 4.0 EZ CLN MEGD (MISCELLANEOUS) ×1 IMPLANT
ELECTRODE REM PT RTRN 9FT ADLT (ELECTROSURGICAL) ×1 IMPLANT
EVACUATOR 1/8 PVC DRAIN (DRAIN) IMPLANT
EVACUATOR 3/16  PVC DRAIN (DRAIN) ×1
EVACUATOR 3/16 PVC DRAIN (DRAIN) ×1 IMPLANT
GAUZE SPONGE 4X4 16PLY XRAY LF (GAUZE/BANDAGES/DRESSINGS) ×2 IMPLANT
GLOVE BIO SURGEON STRL SZ8 (GLOVE) ×2 IMPLANT
GLOVE BIOGEL M 8.0 STRL (GLOVE) ×4 IMPLANT
GLOVE BIOGEL PI IND STRL 8 (GLOVE) ×1 IMPLANT
GLOVE BIOGEL PI IND STRL 8.5 (GLOVE) ×2 IMPLANT
GLOVE BIOGEL PI INDICATOR 8 (GLOVE) ×1
GLOVE BIOGEL PI INDICATOR 8.5 (GLOVE) ×2
GLOVE ECLIPSE 7.5 STRL STRAW (GLOVE) ×4 IMPLANT
GLOVE EXAM NITRILE LRG STRL (GLOVE) IMPLANT
GLOVE EXAM NITRILE MD LF STRL (GLOVE) ×2 IMPLANT
GLOVE EXAM NITRILE XL STR (GLOVE) IMPLANT
GLOVE EXAM NITRILE XS STR PU (GLOVE) IMPLANT
GLOVE SURG SS PI 8.0 STRL IVOR (GLOVE) ×10 IMPLANT
GOWN BRE IMP SLV AUR LG STRL (GOWN DISPOSABLE) IMPLANT
GOWN BRE IMP SLV AUR XL STRL (GOWN DISPOSABLE) ×6 IMPLANT
GOWN STRL REIN 2XL LVL4 (GOWN DISPOSABLE) ×6 IMPLANT
KIT BASIN OR (CUSTOM PROCEDURE TRAY) ×2 IMPLANT
KIT ROOM TURNOVER OR (KITS) ×2 IMPLANT
MARKER SKIN DUAL TIP RULER LAB (MISCELLANEOUS) ×2 IMPLANT
MILL MEDIUM DISP (BLADE) ×2 IMPLANT
NEEDLE HYPO 18GX1.5 BLUNT FILL (NEEDLE) IMPLANT
NEEDLE HYPO 21X1.5 SAFETY (NEEDLE) IMPLANT
NEEDLE HYPO 25X1 1.5 SAFETY (NEEDLE) ×2 IMPLANT
NS IRRIG 1000ML POUR BTL (IV SOLUTION) ×2 IMPLANT
PACK FOAM VITOSS 10CC (Orthopedic Implant) ×2 IMPLANT
PACK LAMINECTOMY NEURO (CUSTOM PROCEDURE TRAY) ×2 IMPLANT
PAD ARMBOARD 7.5X6 YLW CONV (MISCELLANEOUS) ×6 IMPLANT
PATTIES SURGICAL .5 X1 (DISPOSABLE) ×2 IMPLANT
PATTIES SURGICAL .5 X3 (DISPOSABLE) IMPLANT
Revere 6.35 Rods, 200mm ×4 IMPLANT
SCREW REVERE 5.5X45 (Screw) ×8 IMPLANT
SCREW REVERE 5.5X50 (Screw) ×8 IMPLANT
SPACER SUSTAIN O 10X10X26 (Screw) ×10 IMPLANT
SPACER SUSTAIN O SM 8X22 10M (Spacer) ×2 IMPLANT
SPONGE GAUZE 4X4 12PLY (GAUZE/BANDAGES/DRESSINGS) ×2 IMPLANT
SPONGE LAP 4X18 X RAY DECT (DISPOSABLE) IMPLANT
SPONGE NEURO XRAY DETECT 1X3 (DISPOSABLE) IMPLANT
SPONGE SURGIFOAM ABS GEL 100 (HEMOSTASIS) ×2 IMPLANT
SPONGE SURGIFOAM ABS GEL SZ50 (HEMOSTASIS) ×2 IMPLANT
STRIP CLOSURE SKIN 1/2X4 (GAUZE/BANDAGES/DRESSINGS) ×4 IMPLANT
SUT VIC AB 1 CT1 18XBRD ANBCTR (SUTURE) ×2 IMPLANT
SUT VIC AB 1 CT1 8-18 (SUTURE) ×2
SUT VIC AB 2-0 CP2 18 (SUTURE) ×6 IMPLANT
SUT VIC AB 3-0 SH 8-18 (SUTURE) ×4 IMPLANT
SYR 20CC LL (SYRINGE) IMPLANT
SYR 20ML ECCENTRIC (SYRINGE) ×2 IMPLANT
SYR 5ML LL (SYRINGE) IMPLANT
TOWEL OR 17X24 6PK STRL BLUE (TOWEL DISPOSABLE) ×2 IMPLANT
TOWEL OR 17X26 10 PK STRL BLUE (TOWEL DISPOSABLE) ×2 IMPLANT
TRAY FOLEY CATH 14FRSI W/METER (CATHETERS) ×2 IMPLANT
WATER STERILE IRR 1000ML POUR (IV SOLUTION) ×2 IMPLANT

## 2012-03-26 NOTE — Anesthesia Preprocedure Evaluation (Addendum)
Anesthesia Evaluation  Patient identified by MRN, date of birth, ID band Patient awake    Reviewed: Allergy & Precautions, H&P , NPO status , Patient's Chart, lab work & pertinent test results  Airway Mallampati: II TM Distance: >3 FB Neck ROM: Limited    Dental  (+) Dental Advisory Given and Teeth Intact   Pulmonary sleep apnea and Continuous Positive Airway Pressure Ventilation , former smoker,          Cardiovascular hypertension, + CAD  Severe right carotid artery stenosis, asymptomatic   Neuro/Psych  Headaches,    GI/Hepatic   Endo/Other  Type 2obese  Renal/GU      Musculoskeletal   Abdominal   Peds  Hematology   Anesthesia Other Findings   Reproductive/Obstetrics                         Anesthesia Physical Anesthesia Plan  ASA: III  Anesthesia Plan: General   Post-op Pain Management:    Induction: Intravenous  Airway Management Planned: Oral ETT  Additional Equipment:   Intra-op Plan:   Post-operative Plan: Extubation in OR  Informed Consent: I have reviewed the patients History and Physical, chart, labs and discussed the procedure including the risks, benefits and alternatives for the proposed anesthesia with the patient or authorized representative who has indicated his/her understanding and acceptance.     Plan Discussed with: CRNA and Surgeon  Anesthesia Plan Comments:         Anesthesia Quick Evaluation

## 2012-03-26 NOTE — Transfer of Care (Signed)
Immediate Anesthesia Transfer of Care Note  Patient: [name]  Procedure(s) Performed: Procedure(s) (LRB): POSTERIOR LUMBAR FUSION 3 LEVEL (N/A)  Patient Location: PACU  Anesthesia Type: General  Level of Consciousness: awake, oriented and patient cooperative  Airway & Oxygen Therapy: Patient Spontanous Breathing and Patient connected to face mask oxygen  Post-op Assessment: Report given to PACU RN and Post -op Vital signs reviewed and stable  Post vital signs: Reviewed  Complications: No apparent anesthesia complications

## 2012-03-26 NOTE — H&P (Signed)
[name] is an 66 y.o. male.   Chief Complaint: lumbar pain HPI: lumbar pain with radiation to both lower extremities which is getting worse. Conservative treatment han not help. He has a history of carcinoma of the prostrate. Myelogram was obtained and because of findings he is being admitted to sirgery  Past Medical History  Diagnosis Date  . Hyperlipidemia   . Personal history of malignant neoplasm of prostate   . Malignant neoplasm of bladder, part unspecified   . Peripheral vascular disease, unspecified   . Personal history of other diseases of digestive system   . Osteoarthrosis, unspecified whether generalized or localized, unspecified site   . Other constipation   . Diabetes mellitus   . Gouty arthritis   . Prolapsed cervical disc   . Carotid stenosis, right     severe, asymptomatic  . Obesity   . Cellulitis Apr 25, 2011    right leg  . Coronary artery disease   . Sepsis march 2013  . Hypertension     dr [name]   [place]    [facility]  . Gout   . Sleep apnea     cpap   last sleep study >5 yrs  . OSA on CPAP     Past Surgical History  Procedure Date  . Cervical fusion   . Appendectomy   . Brain surgery 2011  . Septoplasty   . Cataract extraction, bilateral   . Colonoscopy 04/2011  . Back surgery     x 2 cervical    Family History  Problem Relation Age of Onset  . Cancer Other     Family hx  . Diabetes Other     family hx  . Arthritis Other     family hx  . Cancer Mother   . Heart disease Father     MI  . Colon cancer Brother    Social History:  reports that he has quit smoking. His smoking use included Cigarettes. He has a 40 pack-year smoking history. He has never used smokeless tobacco. He reports that he does not drink alcohol or use illicit drugs.  Allergies:  Allergies  Allergen Reactions  . Pregabalin Nausea Only and Other (See Comments)    Make me feel "bad"    Medications Prior to Admission  Medication Sig Dispense Refill  .  acetaminophen (TYLENOL) 500 MG tablet Take 500 mg by mouth every 6 (six) hours as needed. For pain      . allopurinol (ZYLOPRIM) 300 MG tablet Take 300 mg by mouth at bedtime.       Marland Kitchen amLODipine (NORVASC) 5 MG tablet Take 1 tablet (5 mg total) by mouth daily.  30 tablet  6  . aspirin 81 MG EC tablet Take 162 mg by mouth at bedtime.       . cholecalciferol (VITAMIN D) 1000 UNITS tablet Take 1,000 Units by mouth daily.      . clopidogrel (PLAVIX) 75 MG tablet Take 1 tablet (75 mg total) by mouth daily with breakfast.  30 tablet  6  . colchicine 0.6 MG tablet Take 0.6 mg by mouth daily as needed. For gout      . diclofenac (VOLTAREN) 75 MG EC tablet Take 75 mg by mouth 2 (two) times daily. For 1 to 2 weeks routinely      . docusate sodium (COLACE) 100 MG capsule Take 200 mg by mouth at bedtime.      . furosemide (LASIX) 40 MG tablet Take 40 mg  by mouth daily. 1 tablet po prn for SOB or swelling       . gabapentin (NEURONTIN) 300 MG capsule Take 300 mg by mouth 3 (three) times daily.        Marland Kitchen glimepiride (AMARYL) 2 MG tablet Take 2 mg by mouth 2 (two) times daily.        Marland Kitchen HYDROcodone-acetaminophen (NORCO) 5-325 MG per tablet Take 1 tablet by mouth every 6 (six) hours as needed. For pain      . insulin detemir (LEVEMIR) 100 UNIT/ML injection Inject 8 Units into the skin at bedtime.       Marland Kitchen labetalol (NORMODYNE) 300 MG tablet Take 300 mg by mouth 2 (two) times daily.        Marland Kitchen losartan (COZAAR) 100 MG tablet Take 1 tablet (100 mg total) by mouth daily.  30 tablet  6  . niacin (NIASPAN) 1000 MG CR tablet Take 2,000 mg by mouth at bedtime.       Marland Kitchen omega-3 acid ethyl esters (LOVAZA) 1 G capsule Take 1 g by mouth 2 (two) times daily.       . Potassium Gluconate 595 MG CAPS Take 1 capsule by mouth daily.      . rosuvastatin (CRESTOR) 10 MG tablet Take 10 mg by mouth daily.      . sitaGLIPtan (JANUVIA) 100 MG tablet Take 100 mg by mouth daily.        . Tamsulosin HCl (FLOMAX) 0.4 MG CAPS Take 0.4 mg by  mouth at bedtime.        Results for orders placed during the hospital encounter of 03/26/12 (from the past 48 hour(s))  TYPE AND SCREEN     Status: Normal   Collection Time   03/26/12  7:05 AM      Component Value Range Comment   ABO/RH(D) O POS      Antibody Screen NEG      Sample Expiration 03/29/2012     GLUCOSE, CAPILLARY     Status: Abnormal   Collection Time   03/26/12  7:23 AM      Component Value Range Comment   Glucose-Capillary 127 (*) 70 - 99 mg/dL    Dg Chest 2 View  09/22/1094  *RADIOLOGY REPORT*  Clinical Data: 66 year old male preoperative study.  Hypertension.  CHEST - 2 VIEW  Comparison: 01/04/2009.  Findings: Stable lung volumes.  Stable mild cardiomegaly. Other mediastinal contours are within normal limits.  Cervical fusion hardware partially visible.  No pneumothorax, pulmonary edema, pleural effusion or acute pulmonary opacity. No acute osseous abnormality identified.  IMPRESSION: No acute cardiopulmonary abnormality.  Original Report Authenticated By: [name], M.D.    Review of Systems  Constitutional: Negative.   HENT: Positive for neck pain.   Eyes: Negative.   Respiratory: Negative.   Cardiovascular: Negative.   Gastrointestinal: Negative.   Genitourinary: Positive for frequency.  Musculoskeletal: Positive for back pain.  Skin: Negative.   Neurological: Positive for focal weakness.  Endo/Heme/Allergies: Negative.   Psychiatric/Behavioral: Negative.     Blood pressure 150/76, pulse 71, temperature 98.1 F (36.7 C), resp. rate 18, SpO2 95.00%. Physical Exam hent, nl. Neck, anterior and posterior scars from previous surgery. Decrease of flexibility. Cv, nl. Lungs, some rales. Abdomen, soft. Extremities, grade 1 edema. NEURO slr positive at 45. Femoral maneuver positive. Sensory nl. 4/5 df weakness. Lumbar myelogram shows dddl2 to l5 with spondylolisthesis  And foraminal stenosis  Assessment/Plan Decompression and fusion l2 to l5 with cages and  pedicles  screws. Decision for l5s1 to be made at surgery. Patient and wife are aware of risks and benefits.  [name] M 03/26/2012, 8:48 AM

## 2012-03-26 NOTE — Anesthesia Postprocedure Evaluation (Signed)
Anesthesia Post Note  Patient: [name]  Procedure(s) Performed: Procedure(s) (LRB): POSTERIOR LUMBAR FUSION 3 LEVEL (N/A)  Anesthesia type: General  Patient location: PACU  Post pain: Pain level controlled and Adequate analgesia  Post assessment: Post-op Vital signs reviewed, Patient's Cardiovascular Status Stable, Respiratory Function Stable, Patent Airway and Pain level controlled  Last Vitals:  Filed Vitals:   03/26/12 1527  BP: 138/77  Pulse: 88  Temp:   Resp: 14    Post vital signs: Reviewed and stable  Level of consciousness: awake, alert  and oriented  Complications: No apparent anesthesia complications

## 2012-03-26 NOTE — Progress Notes (Signed)
Op note [phone]

## 2012-03-27 LAB — GLUCOSE, CAPILLARY
Glucose-Capillary: 123 mg/dL — ABNORMAL HIGH (ref 70–99)
Glucose-Capillary: 69 mg/dL — ABNORMAL LOW (ref 70–99)
Glucose-Capillary: 86 mg/dL (ref 70–99)
Glucose-Capillary: 98 mg/dL (ref 70–99)

## 2012-03-27 MED ORDER — FLEET ENEMA 7-19 GM/118ML RE ENEM: 1.0000 | ENEMA | Freq: Every day | RECTAL | Status: DC | PRN

## 2012-03-27 NOTE — Evaluation (Signed)
Physical Therapy Evaluation Patient Details Name: [name] MRN: [id number] DOB: 18-Mar-1946 Today's Date: 03/27/2012 Time: 4098-1191 PT Time Calculation (min): 25 min  PT Assessment / Plan / Recommendation Clinical Impression  Pt is a 66 y/o male admitted s/p L2-5 decompression and fusion along with the below PT problem list.  Pt would benefit from acute PT to maximize independence and facilitate d/c home with HHPT.    PT Assessment  Patient needs continued PT services    Follow Up Recommendations  Home health PT    Barriers to Discharge Decreased caregiver support Wife only able to provide supervision at home.    Equipment Recommendations  Rolling walker with 5" wheels    Recommendations for Other Services     Frequency Min 5X/week    Precautions / Restrictions Precautions Precautions: Back Precaution Booklet Issued: Yes (comment) Precaution Comments: Educated on 3/3 back precautions. Required Braces or Orthoses: Spinal Brace Spinal Brace: Lumbar corset;Applied in sitting position Restrictions Weight Bearing Restrictions: No   Pertinent Vitals/Pain 7/10 in back with pt repositioned and PCA use encouraged.      Mobility  Bed Mobility Bed Mobility: Rolling Left;Left Sidelying to Sit Rolling Left: 4: Min assist;With rail Left Sidelying to Sit: 4: Min assist;HOB flat;With rails Details for Bed Mobility Assistance: Assist to facilitate trunk rotation as well as translate trunk to midline from sidelying.   Transfers Transfers: Sit to Stand;Stand to Sit Sit to Stand: 4: Min assist;With upper extremity assist;From bed Stand to Sit: 4: Min assist;With upper extremity assist;To chair/3-in-1 Details for Transfer Assistance: Assist to translate trunk anterior and slow descent due to pain with cues for safest hand placement to protect back. Ambulation/Gait Ambulation/Gait Assistance: 4: Min assist Ambulation Distance (Feet): 2 Feet Assistive device: Rolling  walker Ambulation/Gait Assistance Details: Assist for balance with distance greatly limited by pain.  Cues for tall posture and safety inside RW. Gait Pattern: Decreased stride length;Shuffle;Antalgic;Trunk flexed Stairs: No Wheelchair Mobility Wheelchair Mobility: No    Exercises     PT Diagnosis: Difficulty walking;Acute pain  PT Problem List: Decreased strength;Decreased activity tolerance;Decreased balance;Decreased mobility;Decreased knowledge of use of DME;Decreased knowledge of precautions;Pain PT Treatment Interventions: DME instruction;Gait training;Stair training;Functional mobility training;Therapeutic activities;Balance training;Patient/family education   PT Goals Acute Rehab PT Goals PT Goal Formulation: With patient Time For Goal Achievement: 04/03/12 Potential to Achieve Goals: Good Pt will Roll Supine to Right Side: with modified independence PT Goal: Rolling Supine to Right Side - Progress: Goal set today Pt will Roll Supine to Left Side: with modified independence PT Goal: Rolling Supine to Left Side - Progress: Goal set today Pt will go Supine/Side to Sit: with modified independence PT Goal: Supine/Side to Sit - Progress: Goal set today Pt will go Sit to Supine/Side: with modified independence PT Goal: Sit to Supine/Side - Progress: Goal set today Pt will go Sit to Stand: with modified independence PT Goal: Sit to Stand - Progress: Goal set today Pt will go Stand to Sit: with modified independence PT Goal: Stand to Sit - Progress: Goal set today Pt will Ambulate: >150 feet;with modified independence;with least restrictive assistive device PT Goal: Ambulate - Progress: Goal set today Pt will Go Up / Down Stairs: 1-2 stairs;with modified independence;with least restrictive assistive device PT Goal: Up/Down Stairs - Progress: Goal set today  Visit Information  Last PT Received On: 03/27/12 Assistance Needed: +1    Subjective Data  Subjective: "I am not sure I  am ready." Patient Stated Goal: Go home.  Prior Functioning  Home Living Lives With: Spouse Available Help at Discharge: Family Type of Home: House Home Access: Stairs to enter Secretary/administrator of Steps: 2 Entrance Stairs-Rails: None Home Layout: One level Bathroom Shower/Tub: Forensic scientist: Standard Bathroom Accessibility: Yes How Accessible: Accessible via walker Home Adaptive Equipment: None Additional Comments: Wife unable to assist pt. physically Prior Function Level of Independence: Independent Able to Take Stairs?: Yes Driving: Yes Vocation: Retired Musician: No difficulties Dominant Hand: Right    Cognition  Overall Cognitive Status: Appears within functional limits for tasks assessed/performed Area of Impairment: Attention;Problem solving Arousal/Alertness: Awake/alert Orientation Level: Appears intact for tasks assessed Behavior During Session: [facility] for tasks performed Current Attention Level: Sustained Attention - Other Comments: impaired likely due to pain meds Problem Solving: Min A - impaired likely due to pain meds    Extremity/Trunk Assessment Right Upper Extremity Assessment RUE ROM/Strength/Tone: Within functional levels RUE Sensation: WFL - Light Touch RUE Coordination: WFL - gross/fine motor Left Upper Extremity Assessment LUE ROM/Strength/Tone: Within functional levels LUE Sensation: WFL - Light Touch LUE Coordination: WFL - gross/fine motor Right Lower Extremity Assessment RLE ROM/Strength/Tone: Deficits;Due to pain RLE ROM/Strength/Tone Deficits: 4/5 RLE Sensation: WFL - Light Touch RLE Coordination: WFL - gross motor Left Lower Extremity Assessment LLE ROM/Strength/Tone: Deficits;Due to pain LLE ROM/Strength/Tone Deficits: 4/5 LLE Sensation: WFL - Light Touch LLE Coordination: WFL - gross motor Trunk Assessment Trunk Assessment: Normal   Balance Balance Balance Assessed: No  End of  Session PT - End of Session Equipment Utilized During Treatment: Gait belt;Back brace Activity Tolerance: Patient limited by pain Patient left: in chair;with call bell/phone within reach Nurse Communication: Mobility status  GP     Cephus Shelling 03/27/2012, 11:59 AM  03/27/2012 Cephus Shelling, PT, DPT [phone]

## 2012-03-27 NOTE — Progress Notes (Signed)
UR COMPLETED  

## 2012-03-27 NOTE — Progress Notes (Signed)
Patient ID: [name], male   DOB: 12-23-1945, 66 y.o.   MRN: [id number] Sleepy, c/o of incisional pain. Slight abdominal distention. Some flatus

## 2012-03-27 NOTE — Progress Notes (Signed)
Patient not voiding after foley removed this morning, states he doesn't feel the need to void.  Bladder scanned for 506 ml and In & Out cath 625 ml of clear colored urine.  Will continue to monitor.  Revia Nghiem Terral

## 2012-03-27 NOTE — Plan of Care (Signed)
Problem: Consults Goal: Diagnosis - Spinal Surgery Outcome: Completed/Met Date Met:  03/27/12 Thoraco/Lumbar Spine Fusion

## 2012-03-27 NOTE — Clinical Social Work Note (Signed)
CSW received consult for SNF. Discussed pt during progression with RN. PT is recommending home health PT. RNCM is aware and following. CSW is signing off as no further needs identified. Please reconsult if a need arises prior to discharge.   Dede Query, MSW, Theresia Majors [phone]

## 2012-03-27 NOTE — Progress Notes (Signed)
Patient ID: [name], male   DOB: December 29, 1945, 66 y.o.   MRN: [id number] C/o incisional pain. hemovac working well. No weakness. Pt to help [place].

## 2012-03-27 NOTE — Evaluation (Signed)
Occupational Therapy Evaluation Patient Details Name: [name] MRN: [id number] DOB: 1945/10/30 Today's Date: 03/27/2012 Time: 1030-1046 OT Time Calculation (min): 16 min  OT Assessment / Plan / Recommendation Clinical Impression  This 66 y.o. male admitted for L2-3, L3-4 Fusion and L5-S1 Foraminotomy.  Pt. limited on eval by pain - he is resistant to using PCA pump.  He presents with the below listed deficits and will benefit from OT to maximize safety and independence with BADLs to allow him to return home modified independently to supervision level with wife.  Will likely require use of adaptive equipment for LB ADLs    OT Assessment  Patient needs continued OT Services    Follow Up Recommendations  Supervision - Intermittent;Home health OT    Barriers to Discharge Decreased caregiver support    Equipment Recommendations  3 in 1 bedside comode    Recommendations for Other Services    Frequency  Min 2X/week    Precautions / Restrictions Precautions Precautions: Back Precaution Booklet Issued: Yes (comment) Precaution Comments: Educated on 3/3 back precautions. (requires mod verbal cues) Required Braces or Orthoses: Spinal Brace Spinal Brace: Lumbar corset;Applied in sitting position Restrictions Weight Bearing Restrictions: No   Pertinent Vitals/Pain 9/10 - resistant to use of PCA - needs encouragement    ADL  Eating/Feeding: Simulated;Independent Where Assessed - Eating/Feeding: Chair Grooming: Simulated;Wash/dry hands;Wash/dry face;Teeth care;Set up Where Assessed - Grooming: Supported sitting Upper Body Bathing: Simulated;Minimal assistance Where Assessed - Upper Body Bathing: Supported sitting Lower Body Bathing: Simulated;Maximal assistance Where Assessed - Lower Body Bathing: Supported sit to stand Upper Body Dressing: Simulated;Minimal assistance Where Assessed - Upper Body Dressing: Unsupported sitting Lower Body Dressing: Simulated;+1 Total  assistance Where Assessed - Lower Body Dressing: Supported sit to Pharmacist, hospital: Mining engineer Method: Surveyor, minerals: Materials engineer and Hygiene: Simulated;Moderate assistance Where Assessed - Toileting Clothing Manipulation and Hygiene: Standing Equipment Used: Back brace;Rolling walker Transfers/Ambulation Related to ADLs: Pt. requires min A  for sit to stand ADL Comments: Pt. reports he crossed ankles over knees for LB ADLs PTA.  Today, unable to access feet due to pain and stiffness.  Pt. instructed in AE for LB ADLs, but unable to practice today due to pain    OT Diagnosis: Generalized weakness;Acute pain  OT Problem List: Decreased strength;Decreased activity tolerance;Decreased knowledge of use of DME or AE;Decreased knowledge of precautions;Pain OT Treatment Interventions: Self-care/ADL training;DME and/or AE instruction;Therapeutic activities;Patient/family education   OT Goals Acute Rehab OT Goals OT Goal Formulation: With patient Time For Goal Achievement: 04/03/12 Potential to Achieve Goals: Good ADL Goals Pt Will Perform Lower Body Bathing: with supervision;Sit to stand from chair;Sit to stand from bed ADL Goal: Lower Body Bathing - Progress: Goal set today Pt Will Perform Lower Body Dressing: with supervision;with adaptive equipment;Sit to stand from bed;Sit to stand from chair ADL Goal: Lower Body Dressing - Progress: Goal set today Pt Will Transfer to Toilet: with supervision;Ambulation;3-in-1 ADL Goal: Toilet Transfer - Progress: Goal set today Pt Will Perform Toileting - Clothing Manipulation: with supervision;Standing ADL Goal: Toileting - Clothing Manipulation - Progress: Progressing toward goals Pt Will Perform Toileting - Hygiene: with supervision;with adaptive equipment;Sit to stand from 3-in-1/toilet ADL Goal: Toileting - Hygiene - Progress: Goal set  today Additional ADL Goal #1: Pt. will be independent with back precautions ADL Goal: Additional Goal #1 - Progress: Goal set today Additional ADL Goal #2: Pt. will independently don/doff brace ADL Goal: Additional Goal #  2 - Progress: Goal set today  Visit Information  Last OT Received On: 03/27/12 Assistance Needed: +1    Subjective Data  Subjective: "I can get dressed" Patient Stated Goal: To get better   Prior Functioning  Home Living Lives With: Spouse Available Help at Discharge: Family Type of Home: House Home Access: Stairs to enter Secretary/administrator of Steps: 2 Entrance Stairs-Rails: None Home Layout: One level Bathroom Shower/Tub: Health visitor: Handicapped height Bathroom Accessibility: Yes How Accessible: Accessible via walker Home Adaptive Equipment:  (built in shower seat in walk in shower) Additional Comments: Wife unable to assist pt. physically Prior Function Level of Independence: Independent Able to Take Stairs?: Yes Driving: Yes Vocation: Retired Musician: No difficulties Dominant Hand: Right    Cognition  Overall Cognitive Status: Impaired Area of Impairment: Attention;Problem solving Arousal/Alertness: Awake/alert Orientation Level: Appears intact for tasks assessed Behavior During Session: [facility] for tasks performed Current Attention Level: Sustained Attention - Other Comments: impaired likely due to pain meds Problem Solving: Min A - impaired likely due to pain meds    Extremity/Trunk Assessment Right Upper Extremity Assessment RUE ROM/Strength/Tone: Within functional levels RUE Sensation: WFL - Light Touch RUE Coordination: WFL - gross/fine motor Left Upper Extremity Assessment LUE ROM/Strength/Tone: Within functional levels LUE Sensation: WFL - Light Touch LUE Coordination: WFL - gross/fine motor Right Lower Extremity Assessment RLE ROM/Strength/Tone: Deficits;Due to pain RLE ROM/Strength/Tone  Deficits: 4/5 RLE Sensation: WFL - Light Touch RLE Coordination: WFL - gross motor Left Lower Extremity Assessment LLE ROM/Strength/Tone: Deficits;Due to pain LLE ROM/Strength/Tone Deficits: 4/5 LLE Sensation: WFL - Light Touch LLE Coordination: WFL - gross motor Trunk Assessment Trunk Assessment: Normal   Mobility Transfers Transfers: Sit to Stand;Stand to Sit Sit to Stand: 4: Min assist;With armrests;From chair/3-in-1 Stand to Sit: 4: Min assist;To chair/3-in-1;With upper extremity assist Details for Transfer Assistance: Pt. requires cues for back precautions and hand placement   Exercise    Balance    End of Session OT - End of Session Equipment Utilized During Treatment: Back brace Activity Tolerance: Patient limited by pain Patient left: in chair;with call bell/phone within reach Nurse Communication: Mobility status  GO     Chelcy Bolda M 03/27/2012, 11:02 AM

## 2012-03-27 NOTE — Op Note (Signed)
NAMEFRITZ, CAUTHON NO.:  [id number]  MEDICAL RECORD NO.:  [id number]  LOCATION:  [id number]                        FACILITY:  [facility]  PHYSICIAN:  Hilda Lias, M.D.   DATE OF BIRTH:  Jul 11, 1946  DATE OF PROCEDURE:  03/26/2012 DATE OF DISCHARGE:                              OPERATIVE REPORT   PREOPERATIVE DIAGNOSES:  L2-3, L3-4, L4-5 degenerative disk disease with neurogenic claudication, chronic radiculopathy, foraminal stenosis, also foraminal narrow at the level of 5-1.  POSTOPERATIVE DIAGNOSES:  L2-3, L3-4, L4-5 Degenerative disk disease with neurogenic claudication, chronic radiculopathy, foraminal stenosis, also foraminal narrow at the level of 5-1.  PROCEDURE:  Bilateral 2, 3, 4, and 5 laminectomy.  Stenosis and decompression of the foramina at L5-S1, decompression and fusion, bilateral facetectomy of 2, 3 and 4.  Bilateral L2-3, 3-4, and 4-5 diskectomy beyond what normally we do to be able to fuse the area with cages.  Interbody fusion with cages 10 x 26 with autograft inside. Pedicle screws from L2-L3, L4-L5 with posterolateral arthrodesis using Vitoss as well as autograft.  Decompressive laminectomy of L5 with bilateral foraminotomy of L5-S1.  No fusion at this level.  C-arm, Cell Saver.  SURGEON:  Hilda Lias, MD  ASSISTANT:  Tia Alert, MD  CLINICAL HISTORY:  Mr. Miklas is a 66 year old gentleman who in the past underwent anterior and posterior cervical decompression.  Now, he had been complaining of back pain radiation to both legs, which is getting worse.  X-rays show severe degenerative disk disease from L2-3 down to L4-5 with spondylolisthesis and foraminal narrow.  Although, he had degenerative disk disease at the level of 5-1.  He has a mild foraminal narrow at the area.  Surgery was advised and the risks were fully explained to him and his wife including the possibility of no improvement, CSF leak, infection, need for further  surgery.  PROCEDURE:  The patient was taken to the OR and after intubation, he was positioned on prone manner.  The back was cleaned with DuraPrep.  Drapes were applied.  Midline incision from L1-2 down to L5-S1 was made and muscle were retracted all the way laterally until we were able to feel and see the lateral aspect of the facet.  X-ray were taken.  From then on, we proceeded with removal of spinous process of 2, 3, and 4 as well as the L5.  Then, the lamina and facet of 2, 3 and 4 were accomplished. The patient had quite a bit of overgrowth of the facet with calcification of the yellow ligament.  Then, we entered the disk space at L2-L3 first on the left side and then in the right side and total diskectomy not only medial bilaterally to be able to affect the cages. The same procedure was done at the level of 3-4 and 4-5.  Having a good decompression of the thecal sac at the nerve, cages of 10 x 26 were inserted at those three levels with autograft inside.  Total of 6 cages were done.  At the level of L5-S1 with laminectomy with foraminotomy to decompress the L5 and S1 nerve root.  Having good decompression and having already the cages in  place, we used the C-arm first in AP view and then a lateral view.  We probed the pedicle of 2, 3, 4, and 5.  Once the holes were made, we felt the area just to be sure that we were surrounded by bone in all four quadrants.  Then, pedicle screws of 5.5 x 50 at the level of 2 and 3, and 5.5 x 45 at the level of 4 and 5 were introduced.  This was done using the C-arm in the AP and lateral view. Having good position, a rod from L2-L5 was introduced and kept in place using Capps.  This was done bilaterally.  Then, we went lateral to the facet of 2-3, 3-4, and 5.  The periosteum was removed as well as the periosteum of the transverse process.  Then, a mix of Vitoss and autograft was used for arthrodesis.  Hemostasis was accomplished. We left a drain in  the epidural space.  We used Gelfoam to cover the nerve just to prevent any bone graft from going into the canal or the foramen.  From then on, the wound was closed with several layers of Vicryl and the skin with Steri-Strip.          ______________________________ Hilda Lias, M.D.     EB/MEDQ  D:  03/26/2012  T:  03/27/2012  Job:  [id number]

## 2012-03-28 LAB — POCT I-STAT 4, (NA,K, GLUC, HGB,HCT)
Glucose, Bld: 119 mg/dL — ABNORMAL HIGH (ref 70–99)
HCT: 29 % — ABNORMAL LOW (ref 39.0–52.0)
Potassium: 4.5 mEq/L (ref 3.5–5.1)
Sodium: 142 mEq/L (ref 135–145)

## 2012-03-28 LAB — GLUCOSE, CAPILLARY
Glucose-Capillary: 105 mg/dL — ABNORMAL HIGH (ref 70–99)
Glucose-Capillary: 118 mg/dL — ABNORMAL HIGH (ref 70–99)

## 2012-03-28 MED ORDER — MORPHINE SULFATE 2 MG/ML IJ SOLN
1.0000 mg | INTRAMUSCULAR | Status: DC | PRN
  Administered 2012-03-28 – 2012-03-29 (×2): 4 mg via INTRAVENOUS
  Filled 2012-03-28 (×2): qty 2

## 2012-03-28 MED ORDER — SODIUM CHLORIDE 0.9 % IJ SOLN
3.0000 mL | Freq: Two times a day (BID) | INTRAMUSCULAR | Status: DC
  Administered 2012-03-29: 3 mL via INTRAVENOUS

## 2012-03-28 MED ORDER — MORPHINE SULFATE 2 MG/ML IJ SOLN: 2.0000 mg | INTRAMUSCULAR | Status: DC | PRN

## 2012-03-28 MED ORDER — ACETAMINOPHEN 650 MG RE SUPP: 650.0000 mg | RECTAL | Status: DC | PRN

## 2012-03-28 MED ORDER — ONDANSETRON HCL 4 MG/2ML IJ SOLN: 4.0000 mg | INTRAMUSCULAR | Status: DC | PRN

## 2012-03-28 MED ORDER — SODIUM CHLORIDE 0.9 % IV SOLN: 250.0000 mL | INTRAVENOUS | Status: DC

## 2012-03-28 MED ORDER — ACETAMINOPHEN 325 MG PO TABS
650.0000 mg | ORAL_TABLET | ORAL | Status: DC | PRN
  Administered 2012-04-02 – 2012-04-04 (×5): 650 mg via ORAL
  Filled 2012-03-28 (×5): qty 2

## 2012-03-28 MED ORDER — ACETAMINOPHEN 650 MG RE SUPP
650.0000 mg | RECTAL | Status: DC | PRN
  Administered 2012-03-29: 650 mg via RECTAL
  Filled 2012-03-28: qty 1

## 2012-03-28 MED ORDER — SODIUM CHLORIDE 0.9 % IJ SOLN: 3.0000 mL | INTRAMUSCULAR | Status: DC | PRN

## 2012-03-28 MED ORDER — MENTHOL 3 MG MT LOZG: 1.0000 | LOZENGE | OROMUCOSAL | Status: DC | PRN

## 2012-03-28 MED ORDER — CEFAZOLIN SODIUM 1-5 GM-% IV SOLN
1.0000 g | Freq: Three times a day (TID) | INTRAVENOUS | Status: AC
  Administered 2012-03-28 – 2012-03-29 (×2): 1 g via INTRAVENOUS
  Filled 2012-03-28 (×3): qty 50

## 2012-03-28 MED ORDER — SODIUM CHLORIDE 0.9 % IJ SOLN
3.0000 mL | Freq: Two times a day (BID) | INTRAMUSCULAR | Status: DC
  Administered 2012-03-28 – 2012-03-30 (×4): 3 mL via INTRAVENOUS

## 2012-03-28 MED ORDER — CEFAZOLIN SODIUM 1-5 GM-% IV SOLN: 1.0000 g | Freq: Three times a day (TID) | INTRAVENOUS | Status: DC

## 2012-03-28 MED ORDER — ACETAMINOPHEN 325 MG PO TABS: 650.0000 mg | ORAL_TABLET | ORAL | Status: DC | PRN

## 2012-03-28 MED ORDER — PHENOL 1.4 % MT LIQD: 1.0000 | OROMUCOSAL | Status: DC | PRN

## 2012-03-28 MED FILL — Sodium Chloride Irrigation Soln 0.9%: Qty: 3000 | Status: AC

## 2012-03-28 MED FILL — Sodium Chloride IV Soln 0.9%: INTRAVENOUS | Qty: 1000 | Status: AC

## 2012-03-28 NOTE — Progress Notes (Signed)
Physical Therapy Treatment Patient Details Name: [name] MRN: [id number] DOB: 08-30-1946 Today's Date: 03/28/2012 Time: 4098-1191 PT Time Calculation (min): 31 min  PT Assessment / Plan / Recommendation Comments on Treatment Session  Pt is limited by pain.  Pt rated his pain 20/10.  Sat EOB for a few minutes and sit/stand x 2.  When repositioned in chair, said that he felt better with position change.  Pt used PCA when in chair.  Pt will benefit from continue PT in order to increase activity and mobility. Continue with POC.     Follow Up Recommendations  Home health PT    Barriers to Discharge        Equipment Recommendations  Rolling walker with 5" wheels    Recommendations for Other Services    Frequency Min 5X/week   Plan Discharge plan remains appropriate;Frequency remains appropriate    Precautions / Restrictions Precautions Precautions: Back Precaution Comments: Pt able to recall 1/3 back precautions;  reeducated on 3/3 precautions Required Braces or Orthoses: Spinal Brace Spinal Brace: Applied in sitting position Restrictions Weight Bearing Restrictions: No   Pertinent Vitals/Pain 10/10 (pt stated 20/10)    Mobility  Bed Mobility Rolling Left: With rail;4: Min assist Left Sidelying to Sit: 3: Mod assist;With rails;HOB elevated Details for Bed Mobility Assistance: Assist to lift trunk and position hips to come into upright sitting Transfers Transfers: Stand Pivot Transfers Sit to Stand: 3: Mod assist;With upper extremity assist;From bed Stand to Sit: 3: Mod assist;To bed;To chair/3-in-1;With upper extremity assist Stand Pivot Transfers: 3: Mod assist Details for Transfer Assistance: Assist required to balance and max VC to extend trunk and knees.  VC and tactile cues for proper positioning of LE and to slow descent into sitting on pivot transfer.    Exercises     PT Diagnosis:    PT Problem List:   PT Treatment Interventions:     PT Goals Acute Rehab PT  Goals PT Goal: Rolling Supine to Left Side - Progress: Progressing toward goal PT Goal: Supine/Side to Sit - Progress: Progressing toward goal PT Goal: Sit to Stand - Progress: Progressing toward goal PT Goal: Stand to Sit - Progress: Progressing toward goal  Visit Information  Last PT Received On: 03/28/12 Assistance Needed: +1    Subjective Data  Subjective: "I am hurting really bad.  I am a 20/10 pain."   Cognition  Overall Cognitive Status: Appears within functional limits for tasks assessed/performed Arousal/Alertness: Awake/alert Orientation Level: Appears intact for tasks assessed Behavior During Session: [facility] for tasks performed    Balance  Balance Balance Assessed: Yes Static Sitting Balance Static Sitting - Balance Support: Feet supported;Bilateral upper extremity supported Static Sitting - Level of Assistance: 5: Stand by assistance Static Sitting - Comment/# of Minutes: 5 min  End of Session PT - End of Session Equipment Utilized During Treatment: Gait belt Activity Tolerance: Patient limited by pain Patient left: in chair;with call bell/phone within reach Nurse Communication: Patient requests pain meds   GP     Ayomide Purdy 03/28/2012, 10:41 AM

## 2012-03-28 NOTE — Progress Notes (Signed)
Patient ID: [name], male   DOB: 12/26/1945, 66 y.o.   MRN: [id number] Afebril. C/o incisional pain. No leg pain or weakness. Decrease of drainage. hemovac to be removed

## 2012-03-28 NOTE — Progress Notes (Signed)
Patient blood sugar was 69. Pt given orange juice. Rechecked sugar and it was 86. Will continue to monitor pt. - M. Colin Broach, RN

## 2012-03-28 NOTE — Progress Notes (Signed)
Patient not voiding after the first in and out cath. Pt tried to void but stated that he couldn't. Bladder scan done and it was . In and out cath done using sterile technique and drain 1000 ml of clear yellow colored urine. Will continue to monitor patient.

## 2012-03-28 NOTE — Progress Notes (Signed)
May need to consider STSNF if pain continues to limit mobility.   Agreed with student note 03/28/2012 Fredrich Birks PTA [phone] pager [phone] office

## 2012-03-28 NOTE — Clinical Social Work Note (Signed)
CSW was reconsulted for SNF. PT is continuing to recommended home health PT. RNCM is aware and following. MD is aware as well. CSW is signing off as no further needs identified. Please reconsult if a need arises prior to discharge.   Dede Query, MSW, Theresia Majors [phone]

## 2012-03-28 NOTE — Progress Notes (Signed)
Patient ID: [name], male   DOB: [birth year], 66 y.o.   MRN: [id number] More awake. Able to get oob by himself.

## 2012-03-28 NOTE — Care Management Note (Signed)
    Page 1 of 1   04/01/2012     2:35:16 PM   CARE MANAGEMENT NOTE 04/01/2012  Patient:  TALLIE, HEVIA   Account Number:  [id number]  Date Initiated:  03/28/2012  Documentation initiated by:  Onnie Boer  Subjective/Objective Assessment:   PT WAS ADMITTED FOR BACK SURGERY     Action/Plan:   PROGRESSION OF CARE AND DISCHARGE PLANNING   Anticipated DC Date:  03/31/2012   Anticipated DC Plan:  IP REHAB FACILITY  In-house referral  Clinical Social Worker      DC Planning Services  CM consult      Choice offered to / List presented to:             Status of service:  In process, will continue to follow Medicare Important Message given?   (If response is "NO", the following Medicare IM given date fields will be blank) Date Medicare IM given:   Date Additional Medicare IM given:    Discharge Disposition:    Per UR Regulation:  Reviewed for med. necessity/level of care/duration of stay  If discussed at Long Length of Stay Meetings, dates discussed:   04/01/2012    Comments:  04/01/12 Onnie Boer, RN, BSN 1434 PT IS A GOOD CANDIDATE FOR CIR, SHOULD DC TO CIR SOON. WILL F/U.  03/28/12 Onnie Boer, RN, BSN 1555 PT WILL EITHER GO HOME WITH HH OR TO CIR ON MONDAY. IF PT GOES HOME, HE HAS REFUSED HH AS HE STATES THAT HIS WIFE KNOWS WHAT TO DO.  HE STATED THAT HE GETS HIS DME'S FROM [facility] [facility].   WILL F/U.

## 2012-03-28 NOTE — Evaluation (Signed)
Occupational Therapy Evaluation Patient Details Name: BRANNON LEVENE MRN: [id number] DOB: Nov 10, 1945 Today's Date: 03/28/2012 Time: 4098-1191 OT Time Calculation (min): 26 min  OT Assessment / Plan / Recommendation Clinical Impression       OT Assessment       Follow Up Recommendations  Supervision/Assistance - 24 hour;Home health OT    Barriers to Discharge      Equipment Recommendations  Rolling walker with 5" wheels;3 in 1 bedside comode    Recommendations for Other Services    Frequency  Min 2X/week    Precautions / Restrictions Precautions Precautions: Back Precaution Booklet Issued: Yes (comment) Precaution Comments: Pt. able to state 2/3 precautions, but requires mod cues  Required Braces or Orthoses: Spinal Brace Spinal Brace: Applied in sitting position Restrictions Weight Bearing Restrictions: No       ADL  Lower Body Dressing: Performed;+1 Total assistance Where Assessed - Lower Body Dressing: Supported sit to stand Toilet Transfer: Mining engineer Method: Surveyor, minerals: Bedside commode Equipment Used: Back brace;Reacher;Rolling walker Transfers/Ambulation Related to ADLs: Pt. requires min A  for sit to stand ADL Comments: Pt. continues with 10/10 pain.  Pt. with significant difficulty maitaining attention.  Pt. instructed in back precautions and use of reacher. Pt. able to doff Rt. sock, but requires total A to don Lt. sock.  Pt. required significant amount of time just for that activity, and states he can do nothing more. He reports that his wife can help him with dressing (although yesterday he indicated that she could not help physically).  He also reports a dtr-in-law can assit, but is unable to provide details on how much she is able to assist.  Pt. moved sit to stand with min A for repositioning in attempts at easing back pain    OT Diagnosis:    OT Problem List:   OT Treatment Interventions:     OT  Goals ADL Goals ADL Goal: Lower Body Dressing - Progress: Not progressing ADL Goal: Toilet Transfer - Progress: Progressing toward goals ADL Goal: Additional Goal #1 - Progress: Progressing toward goals  Visit Information  Last OT Received On: 03/28/12 Assistance Needed: +1    Subjective Data      Prior Functioning  Vision/Perception         Cognition  Overall Cognitive Status: Impaired Area of Impairment: Attention;Problem solving Arousal/Alertness: Awake/alert Orientation Level: Appears intact for tasks assessed Behavior During Session: Restless Current Attention Level: Sustained Attention - Other Comments: sustained for 2 min periods before needing re-direction Problem Solving: mod A  - likely due to pain meds    Extremity/Trunk Assessment     Mobility Bed Mobility Rolling Left: With rail;4: Min assist Left Sidelying to Sit: 3: Mod assist;With rails;HOB elevated Details for Bed Mobility Assistance: Assist to lift trunk and position hips to come into upright sitting Transfers Transfers: Sit to Stand;Stand to Sit Sit to Stand: 4: Min assist;With upper extremity assist;From chair/3-in-1 Stand to Sit: 4: Min assist;With armrests;To chair/3-in-1 Details for Transfer Assistance: Requires cues for hand placement   Exercise    Balance Balance Balance Assessed: Yes Static Sitting Balance Static Sitting - Balance Support: Feet supported;Bilateral upper extremity supported Static Sitting - Level of Assistance: 5: Stand by assistance Static Sitting - Comment/# of Minutes: 5 min  End of Session OT - End of Session Equipment Utilized During Treatment: Back brace Activity Tolerance: Patient limited by pain Patient left: in chair;with call bell/phone within reach Nurse Communication: Patient requests  pain meds  GO     Jillian Warth M 03/28/2012, 11:11 AM

## 2012-03-29 LAB — CBC WITH DIFFERENTIAL/PLATELET
Basophils Absolute: 0 10*3/uL (ref 0.0–0.1)
Eosinophils Absolute: 0 10*3/uL (ref 0.0–0.7)
Eosinophils Relative: 0 % (ref 0–5)
HCT: 26.6 % — ABNORMAL LOW (ref 39.0–52.0)
Lymphocytes Relative: 10 % — ABNORMAL LOW (ref 12–46)
Lymphs Abs: 1.2 10*3/uL (ref 0.7–4.0)
MCH: 30.1 pg (ref 26.0–34.0)
MCV: 89.9 fL (ref 78.0–100.0)
Monocytes Absolute: 1.9 10*3/uL — ABNORMAL HIGH (ref 0.1–1.0)
Platelets: 171 10*3/uL (ref 150–400)
RDW: 13.7 % (ref 11.5–15.5)

## 2012-03-29 LAB — GLUCOSE, CAPILLARY
Glucose-Capillary: 158 mg/dL — ABNORMAL HIGH (ref 70–99)
Glucose-Capillary: 151 mg/dL — ABNORMAL HIGH (ref 70–99)
Glucose-Capillary: 132 mg/dL — ABNORMAL HIGH (ref 70–99)
Glucose-Capillary: 127 mg/dL — ABNORMAL HIGH (ref 70–99)

## 2012-03-29 MED ORDER — OXYCODONE HCL 5 MG PO TABS
5.0000 mg | ORAL_TABLET | ORAL | Status: DC | PRN
  Administered 2012-03-29 – 2012-04-01 (×3): 5 mg via ORAL
  Filled 2012-03-29 (×4): qty 1

## 2012-03-29 MED ORDER — OXYCODONE-ACETAMINOPHEN 5-325 MG PO TABS
2.0000 | ORAL_TABLET | ORAL | Status: DC | PRN
  Administered 2012-03-29 – 2012-03-30 (×4): 2 via ORAL
  Administered 2012-03-31: 1 via ORAL
  Administered 2012-03-31 – 2012-04-01 (×5): 2 via ORAL
  Administered 2012-04-01: 1 via ORAL
  Administered 2012-04-01 – 2012-04-02 (×3): 2 via ORAL
  Filled 2012-03-29: qty 1
  Filled 2012-03-29 (×10): qty 2
  Filled 2012-03-29: qty 1
  Filled 2012-03-29 (×2): qty 2

## 2012-03-29 MED ORDER — FLEET ENEMA 7-19 GM/118ML RE ENEM
1.0000 | ENEMA | Freq: Every day | RECTAL | Status: DC | PRN
Start: 2012-03-29 — End: 2012-03-30

## 2012-03-29 MED ORDER — HEPARIN SODIUM (PORCINE) 5000 UNIT/ML IJ SOLN
5000.0000 [IU] | Freq: Three times a day (TID) | INTRAMUSCULAR | Status: DC
  Administered 2012-03-29 – 2012-04-04 (×19): 5000 [IU] via SUBCUTANEOUS
  Filled 2012-03-29 (×22): qty 1

## 2012-03-29 NOTE — Progress Notes (Signed)
Occupational Therapy Treatment Patient Details Name: [name] MRN: [id number] DOB: November 02, 1945 Today's Date: 03/29/2012 Time: 6387-5643 OT Time Calculation (min): 27 min  OT Assessment / Plan / Recommendation Comments on Treatment Session Pt requiring increased (A) from OT compared to OT and PT evaluations on 03/28/12. See progress note for addition changes noted. Pt d/c recommendation is for SNF at this time    Follow Up Recommendations  Skilled nursing facility    Barriers to Discharge       Equipment Recommendations  Defer to next venue    Recommendations for Other Services    Frequency Min 2X/week   Plan Discharge plan needs to be updated    Precautions / Restrictions Precautions Precautions: Back Precaution Comments: Pt unable to recall any of the back precautions.   Required Braces or Orthoses: Spinal Brace Spinal Brace: Applied in sitting position Restrictions Weight Bearing Restrictions: No   Pertinent Vitals/Pain 100.8 fever, BP 149/105, oxygen 98 %, HR 105     ADL  Grooming: Performed;Teeth care;+1 Total assistance Where Assessed - Grooming: Supported sitting Upper Body Dressing: Performed;Maximal assistance Where Assessed - Upper Body Dressing: Supported sitting Toilet Transfer: Simulated;+2 Total assistance Toilet Transfer: Patient Percentage: 40% Statistician Method: Sit to Barista: Regular height toilet Equipment Used: Gait belt;Back brace;Rolling walker Transfers/Ambulation Related to ADLs: Pt ambulated from EOB to sink level ~ 12 feet with Rt LE weakness noted. Pt buckling ADL Comments: Pt with noted strabimus with eyes. Pt with slurred speech on arrival and unable to understand patient. Pt required total (A) for bed mobility to the Rt side. Pt sitting EOB with MOd (A) and progressing to Min (A). Pt      OT Goals Acute Rehab OT Goals OT Goal Formulation: With patient Time For Goal Achievement: 04/03/12 Potential to  Achieve Goals: Good ADL Goals Pt Will Perform Lower Body Bathing: with supervision;Sit to stand from chair;Sit to stand from bed ADL Goal: Lower Body Bathing - Progress: Not progressing Pt Will Perform Lower Body Dressing: with supervision;with adaptive equipment;Sit to stand from bed;Sit to stand from chair ADL Goal: Lower Body Dressing - Progress: Not progressing Pt Will Transfer to Toilet: with supervision;Ambulation;3-in-1 ADL Goal: Toilet Transfer - Progress: Not progressing Pt Will Perform Toileting - Clothing Manipulation: with supervision;Standing ADL Goal: Toileting - Clothing Manipulation - Progress: Not progressing Pt Will Perform Toileting - Hygiene: with supervision;with adaptive equipment;Sit to stand from 3-in-1/toilet ADL Goal: Toileting - Hygiene - Progress: Not progressing Additional ADL Goal #1: Pt. will be independent with back precautions ADL Goal: Additional Goal #1 - Progress: Not progressing Additional ADL Goal #2: Pt. will independently don/doff brace ADL Goal: Additional Goal #2 - Progress: Not progressing  Visit Information  Last OT Received On: 03/29/12 Assistance Needed: +2               Cognition  Overall Cognitive Status: Impaired Area of Impairment: Attention;Following commands;Safety/judgement;Problem solving Arousal/Alertness: Lethargic Orientation Level: Disoriented to;Place;Situation Behavior During Session: Lethargic Current Attention Level: Focused Attention - Other Comments: Pt demonstrated sustained attention to oral care for 15 seconds. Following Commands: Follows one step commands inconsistently;Follows one step commands with increased time Safety/Judgement: Decreased awareness of safety precautions;Decreased awareness of need for assistance    Mobility Bed Mobility Bed Mobility: Supine to Sit;Sit to Supine;Sitting - Scoot to Edge of Bed Supine to Sit: 1: +1 Total assist;HOB elevated (used pad to (A)) Sitting - Scoot to Edge of Bed: 1:  +1 Total assist (with pad (  A)) Sit to Supine: 2: Max assist;HOB flat;With rail (BIl LE (A) into bed) Details for Bed Mobility Assistance: Pt required cueing for sequencing and following back p  Transfers Transfers: Sit to Stand;Stand to Sit Sit to Stand: 1: +2 Total assist;From elevated surface;With upper extremity assist;From bed Sit to Stand: Patient Percentage: 60% Stand to Sit: 1: +2 Total assist;With armrests;With upper extremity assist;To bed Stand to Sit: Patient Percentage: 60% Details for Transfer Assistance: max directional cues for safety pulling up on RW required TOtal (A) to push RW   Exercises    Balance Balance Balance Assessed: Yes Static Sitting Balance Static Sitting - Balance Support: Bilateral upper extremity supported;Feet supported Static Sitting - Level of Assistance: 3: Mod assist Static Sitting - Comment/# of Minutes: 5 Static Standing Balance Static Standing - Balance Support: Bilateral upper extremity supported;During functional activity Static Standing - Level of Assistance: 3: Mod assist Static Standing - Comment/# of Minutes: 3  End of Session OT - End of Session Activity Tolerance: Patient limited by pain;Other (comment) (disoriented) Patient left: in bed;with call bell/phone within reach Nurse Communication: Mobility status;Other (comment) (RN changes in status compared to evaluation)  GO     Lucile Shutters 03/29/2012, 3:24 PM Pager: [phone]

## 2012-03-29 NOTE — Progress Notes (Signed)
OT NOTE  03/29/2012    Pt now with 100.8 fever, BP 149/105, oxygen 98 %, HR 105, Rt LE weakness with ambulation, total +2 (A) for sit<>Stand, slurred speech, Rt UE intentional tremor,  disoriented to place, situation, reason for admission which are noted changes from original evaluation by OT.  PTA Tresa Endo noting changes in speech during session. RN Darryle called to room and aware of changes. OT treatment note to follow progress note.   [name] [place]   OTR/L Pager: [phone] Office: [phone] .

## 2012-03-29 NOTE — Progress Notes (Signed)
Pt found to be lethargic with Temp.  102.8 F orally. CBG 121. Given Tylenol 650 mg PR. Dr Danielle Dess  Paged and notified of Pt condition. Received order for CBC with diff. States will check results in the morning.

## 2012-03-29 NOTE — Progress Notes (Signed)
MD advised of change in temp and HR. Nurse advised to monitor pt closely and call with any changes.

## 2012-03-29 NOTE — Progress Notes (Signed)
Patient ID: [name], male   DOB: 1946-05-17, 66 y.o.   MRN: [id number] C/o incisional pain with poor physical acvtivity. No weakness. Sensory normal. hemovac out. Slight increase ot temperature with wet rales in both lungs.

## 2012-03-29 NOTE — Progress Notes (Signed)
Physical Therapy Treatment Patient Details Name: [name] MRN: [id number] DOB: [birth year] Today's Date: 03/29/2012 Time: 9562-1308 PT Time Calculation (min): 32 min  PT Assessment / Plan / Recommendation Comments on Treatment Session  Pt not progressing with mobility this session.  Required max cueing throughout entire session for safe technique & required +2 total (A) for OOB transfers.   Pt very lethargic & with cognitive defecits.  Pt with slurred speaking & difficult to understand.  However, per Dr. Jeral Fruit, pt is more alert than yesterday.   If pt does not progress, he may need more intensive therapy for a safe d/c home.  Will update d/c plans if needed next PT session.       Follow Up Recommendations  Home health PT    Barriers to Discharge        Equipment Recommendations  Rolling walker with 5" wheels;3 in 1 bedside comode    Recommendations for Other Services    Frequency Min 5X/week   Plan Discharge plan remains appropriate;Frequency remains appropriate    Precautions / Restrictions Precautions Precautions: Back Precaution Comments: Pt unable to recall any of the back precautions.   Required Braces or Orthoses: Spinal Brace Spinal Brace: Applied in sitting position Restrictions Weight Bearing Restrictions: No     Pertinent Vitals/Pain C/o pain with all activity.  Not rated.  RN administered IV pain medication at beginning of session.     Mobility  Bed Mobility Bed Mobility: Rolling Left;Left Sidelying to Sit;Sitting - Scoot to Edge of Bed Rolling Left: With rail;3: Mod assist Left Sidelying to Sit: 3: Mod assist;HOB flat;With rails Sitting - Scoot to Edge of Bed: 3: Mod assist;Other (comment) (use of draw pad) Details for Bed Mobility Assistance: Max directional cues for sequencing & technique.  (A) to initiate & carryout rolling, bring LE's EOB>OOB, assist to lift shoulders/trunk to sitting upright, & use of draw pad to bring hip closer to EOB.  Pt slow to  process & difficulty following commands.  Cognitive deficits possibly due to pain meds.   Transfers Transfers: Sit to Stand;Stand to Sit;Stand Pivot Transfers Sit to Stand: 1: +2 Total assist;With upper extremity assist;With armrests;From bed;From chair/3-in-1 Sit to Stand: Patient Percentage: 60% Stand to Sit: 1: +2 Total assist;With armrests;With upper extremity assist;To chair/3-in-1;To bed Stand to Sit: Patient Percentage: 60% Stand Pivot Transfers: 1: +2 Total assist Stand Pivot Transfers: Patient Percentage: 70% Details for Transfer Assistance: max directional cues for safe technique.  Hand-over-hand cueing for UE placement.   (A) to achieve standing, anterior translation of trunk over BOS, balance, RW management, rotation of hips from bed>recliner<>3-in-1, & controlled descent.   Ambulation/Gait Stairs: No Wheelchair Mobility Wheelchair Mobility: No           PT Goals Acute Rehab PT Goals Time For Goal Achievement: 04/03/12 Potential to Achieve Goals: Good PT Goal: Rolling Supine to Left Side - Progress: Not progressing PT Goal: Supine/Side to Sit - Progress: Not progressing PT Goal: Sit to Stand - Progress: Not progressing PT Goal: Stand to Sit - Progress: Not progressing  Visit Information  Last PT Received On: 03/29/12 Assistance Needed: +2 (safety)    Subjective Data      Cognition  Overall Cognitive Status: Impaired Area of Impairment: Attention;Following commands;Safety/judgement;Problem solving Arousal/Alertness: Lethargic Orientation Level: Disoriented to;Time Behavior During Session: Lethargic Following Commands: Follows one step commands inconsistently Safety/Judgement: Decreased awareness of safety precautions;Decreased safety judgement for tasks assessed;Decreased awareness of need for assistance Problem Solving: Pt required max directional cues  for technique Cognition - Other Comments: Pt slow to process cues, required repeated cueing throughout, max  directional cues for safe technique     Verdell Face, PTA [phone] 03/29/2012

## 2012-03-30 ENCOUNTER — Inpatient Hospital Stay (HOSPITAL_COMMUNITY): Payer: Medicare Other

## 2012-03-30 LAB — GLUCOSE, CAPILLARY: Glucose-Capillary: 132 mg/dL — ABNORMAL HIGH (ref 70–99)

## 2012-03-30 MED ORDER — FLEET ENEMA 7-19 GM/118ML RE ENEM
1.0000 | ENEMA | Freq: Every day | RECTAL | Status: DC | PRN
  Filled 2012-03-30: qty 1

## 2012-03-30 NOTE — Progress Notes (Signed)
Pt more alert, conversant. Able to verbalized physical needs. F/U temp. As follow; 98.3 at 2249; 100.0 at 0147; 98.0 at 0615. Admits pain and given IV Toradol at 0208 along with valium at 0226 for spasms; effective. Blood pressure remains high.

## 2012-03-30 NOTE — Progress Notes (Signed)
Physical Therapy Treatment Patient Details Name: [name] MRN: [id number] DOB: [birth year] Today's Date: 03/30/2012 Time: 4742-5956 PT Time Calculation (min): 12 min  PT Assessment / Plan / Recommendation Comments on Treatment Session  Assisted pt back to bed with OT. Pt extremely lethargic and weak in the LEs. During transfer, pt complained of the need to sit right away and was unable to finish transfer without increased assistance. Pt reported decreased pain upon lying down, although still with 9/10 pain. Updated discharge plan to SNF, daughter agreeable.     Follow Up Recommendations  Skilled nursing facility    Barriers to Discharge        Equipment Recommendations  Defer to next venue    Recommendations for Other Services    Frequency Min 5X/week   Plan Frequency remains appropriate;Discharge plan needs to be updated    Precautions / Restrictions Precautions Precautions: Back Precaution Comments: Pt confused and unable to recall precautions or educate Required Braces or Orthoses: Spinal Brace Spinal Brace: Applied in sitting position Restrictions Weight Bearing Restrictions: No       Mobility  Bed Mobility Bed Mobility: Sit to Supine;Sit to Sidelying Left Rolling Left: 3: Mod assist;With rail Left Sidelying to Sit: 1: +2 Total assist;HOB flat;With rails Left Sidelying to Sit: Patient Percentage: 50% Sitting - Scoot to Edge of Bed: 3: Mod assist;With rail Sit to Supine: 1: +2 Total assist;HOB flat Sit to Supine: Patient Percentage: 30% Sit to Sidelying Left: 1: +2 Total assist;HOB flat Sit to Sidelying Left: Patient Percentage: 30% Details for Bed Mobility Assistance: Cues for sequencing and precautions.  Assist needed at trunk and for LE's Transfers Transfers: Sit to Stand;Stand to Sit;Stand Pivot Transfers Sit to Stand: 1: +2 Total assist;From chair/3-in-1 Sit to Stand: Patient Percentage: 70% Stand to Sit: 1: +2 Total assist;To bed Stand to Sit: Patient  Percentage: 70% Stand Pivot Transfers: 1: +2 Total assist Stand Pivot Transfers: Patient Percentage: 60% Details for Transfer Assistance: cues for safety and precautions. SPT from chair to bed, assist through pelvis for directional movement. Pt able with min assist to scoot sideways to [facility]. Ambulation/Gait Ambulation/Gait Assistance: Not tested (comment)       PT Goals Acute Rehab PT Goals Time For Goal Achievement: 04/03/12 Potential to Achieve Goals: Good PT Goal: Rolling Supine to Left Side - Progress: Progressing toward goal PT Goal: Supine/Side to Sit - Progress: Not progressing PT Goal: Sit to Supine/Side - Progress: Progressing toward goal PT Goal: Sit to Stand - Progress: Progressing toward goal PT Goal: Stand to Sit - Progress: Not progressing PT Goal: Ambulate - Progress: Not progressing  Visit Information  Last PT Received On: 03/30/12 Assistance Needed: +2 PT/OT Co-Evaluation/Treatment: Yes    Subjective Data  Subjective: "I think I'm going to die."   Cognition  Overall Cognitive Status: Impaired Area of Impairment: Attention;Following commands;Safety/judgement;Problem solving Arousal/Alertness: Lethargic Orientation Level: Disoriented to;Place;Situation Behavior During Session: Lethargic Current Attention Level: Focused Following Commands: Follows one step commands inconsistently;Follows one step commands with increased time Safety/Judgement: Decreased awareness of safety precautions;Decreased awareness of need for assistance Problem Solving: Pt required max directional cues for technique Cognition - Other Comments: Pt slow to process cues, required repeated cueing throughout, max directional cues for safe technique    Balance  Balance Balance Assessed: Yes Static Sitting Balance Static Sitting - Balance Support: Bilateral upper extremity supported;Feet supported Static Sitting - Level of Assistance: 5: Stand by assistance Static Sitting - Comment/# of  Minutes: 5  End of Session  PT - End of Session Equipment Utilized During Treatment: Gait belt;Back brace Activity Tolerance: Patient limited by pain;Treatment limited secondary to medical complications (Comment) Patient left: in chair;with call bell/phone within reach;with family/visitor present Nurse Communication: Mobility status     Milana Kidney 03/30/2012, 1:38 PM  03/30/2012 Milana Kidney DPT PAGER: [phone] OFFICE: [phone]

## 2012-03-30 NOTE — Progress Notes (Signed)
Occupational Therapy Treatment Patient Details Name: [name] MRN: [id number] DOB: [birth year] Today's Date: 03/30/2012 Time: 4098-1191 OT Time Calculation (min): 12 min  OT Assessment / Plan / Recommendation Comments on Treatment Session 66 yo s/p back sx. Pt requiring +2 total A for mobility with pt doing @ 70% at times and total A with LB ADL. Pt unable to D/C home at this level. Rec ST SNF for rehab before returning home. Discussed with daughter who is in agreement.    Follow Up Recommendations  Skilled nursing facility    Barriers to Discharge       Equipment Recommendations  Defer to next venue    Recommendations for Other Services    Frequency Min 2X/week   Plan Discharge plan remains appropriate    Precautions / Restrictions Precautions Precautions: Back Precaution Comments: Pt confused and unable to recall precautions or educate Required Braces or Orthoses: Spinal Brace Spinal Brace: Applied in sitting position Restrictions Weight Bearing Restrictions: No   Pertinent Vitals/Pain Unable to rate. nsg notified    ADL  Transfers/Ambulation Related to ADLs: +2 total A/. Pt @ 70. Transfer 50% stand pivot. ADL Comments: total A with LB ADL    OT Diagnosis:    OT Problem List:   OT Treatment Interventions:     OT Goals Acute Rehab OT Goals OT Goal Formulation: With patient Time For Goal Achievement: 04/03/12 Potential to Achieve Goals: Good ADL Goals Pt Will Perform Lower Body Bathing: with supervision;Sit to stand from chair;Sit to stand from bed ADL Goal: Lower Body Bathing - Progress: Not progressing Pt Will Perform Lower Body Dressing: with supervision;with adaptive equipment;Sit to stand from bed;Sit to stand from chair ADL Goal: Lower Body Dressing - Progress: Not progressing Pt Will Transfer to Toilet: with supervision;Ambulation;3-in-1 ADL Goal: Toilet Transfer - Progress: Progressing toward goals Pt Will Perform Toileting - Clothing Manipulation: with  supervision;Standing ADL Goal: Toileting - Clothing Manipulation - Progress: Not progressing Pt Will Perform Toileting - Hygiene: with supervision;with adaptive equipment;Sit to stand from 3-in-1/toilet ADL Goal: Toileting - Hygiene - Progress: Not progressing Additional ADL Goal #1: Pt. will be independent with back precautions ADL Goal: Additional Goal #1 - Progress: Not progressing Additional ADL Goal #2: Pt. will independently don/doff brace ADL Goal: Additional Goal #2 - Progress: Progressing toward goals  Visit Information  Last OT Received On: 03/30/12 Assistance Needed: +2    Subjective Data      Prior Functioning    Independent   Cognition  Overall Cognitive Status: Impaired Area of Impairment: Attention;Following commands;Safety/judgement;Problem solving Arousal/Alertness: Lethargic Orientation Level: Disoriented to;Place;Situation Behavior During Session: Lethargic Current Attention Level: Focused Following Commands: Follows one step commands inconsistently;Follows one step commands with increased time Safety/Judgement: Decreased awareness of safety precautions;Decreased awareness of need for assistance Problem Solving: Pt required max directional cues for technique Cognition - Other Comments: Pt slow to process cues, required repeated cueing throughout, max directional cues for safe technique    Mobility Bed Mobility Bed Mobility: Sit to Supine;Sit to Sidelying Left Rolling Left: 3: Mod assist;With rail Left Sidelying to Sit: 1: +2 Total assist;HOB flat;With rails Left Sidelying to Sit: Patient Percentage: 50% Sitting - Scoot to Edge of Bed: 3: Mod assist;With rail Sit to Supine: 1: +2 Total assist;HOB flat Sit to Supine: Patient Percentage: 30% Sit to Sidelying Left: 1: +2 Total assist;HOB flat Sit to Sidelying Left: Patient Percentage: 30% Details for Bed Mobility Assistance: Cues for sequencing and precautions.  Assist needed at trunk and for  LE's Transfers Transfers: Sit to Stand;Stand to Sit Sit to Stand: 1: +2 Total assist;From chair/3-in-1 Sit to Stand: Patient Percentage: 70% Stand to Sit: 1: +2 Total assist;To bed Stand to Sit: Patient Percentage: 70% Details for Transfer Assistance: cues for safety and precautions.   Exercises    Balance Balance Balance Assessed: Yes Static Sitting Balance Static Sitting - Balance Support: Bilateral upper extremity supported;Feet supported Static Sitting - Level of Assistance: 5: Stand by assistance Static Sitting - Comment/# of Minutes: 5  End of Session OT - End of Session Equipment Utilized During Treatment: Gait belt Activity Tolerance: Patient limited by pain Patient left: in bed;with call bell/phone within reach;with family/visitor present Nurse Communication: Other (comment);Patient requests pain meds (pt with min confusion.lethargic)  GO     Daryn Pisani,HILLARY 03/30/2012, 11:41 AM Luisa Dago, OTR/L  [phone] 03/30/2012

## 2012-03-30 NOTE — Progress Notes (Signed)
Patient ID: [name], male   DOB: March 07, 1946, 66 y.o.   MRN: [id number] Temp up to 102. C/o incisional pain. Wound dry. No leg pain. Bilateral rales. Still poor physical activity. Consult with respiratory therapist

## 2012-03-30 NOTE — Progress Notes (Signed)
Physical Therapy Treatment Patient Details Name: [name] MRN: [id number] DOB: 1946/07/23 Today's Date: 03/30/2012 Time: 0347-4259 PT Time Calculation (min): 26 min  PT Assessment / Plan / Recommendation Comments on Treatment Session  Pt continues with lethargy and c/o pain despite premedication.  Family present during treatment.  Encouraged pt to sit for 30-45 minutes in chair.  Pt reports feeling better once OOB but still confused and lethargic.      Follow Up Recommendations  Home health PT;Other (comment) (Depending on progress, may need SNF)    Barriers to Discharge        Equipment Recommendations  Defer to next venue    Recommendations for Other Services    Frequency Min 5X/week   Plan Discharge plan remains appropriate;Frequency remains appropriate    Precautions / Restrictions Precautions Precautions: Back Precaution Comments: Pt confused and unable to recall precautions or educate Required Braces or Orthoses: Spinal Brace Spinal Brace: Applied in sitting position Restrictions Weight Bearing Restrictions: No   Pertinent Vitals/Pain C/o back pain-premedicated.  Reports decreased pain with OOB.  Pt unable to rate pain.     Mobility  Bed Mobility Bed Mobility: Supine to Sit;Sitting - Scoot to Edge of Bed Rolling Left: 3: Mod assist;With rail Left Sidelying to Sit: 1: +2 Total assist;HOB flat;With rails Left Sidelying to Sit: Patient Percentage: 50% Sitting - Scoot to Edge of Bed: 3: Mod assist;With rail Details for Bed Mobility Assistance: Cues for sequencing and precautions.  Assist needed at trunk and for LE's Transfers Transfers: Sit to Stand;Stand to Sit;Stand Pivot Transfers Sit to Stand: 1: +2 Total assist;From elevated surface;With upper extremity assist;From bed Sit to Stand: Patient Percentage: 60% Stand to Sit: 1: +2 Total assist;With armrests;With upper extremity assist;To bed Stand to Sit: Patient Percentage: 60% Stand Pivot Transfers: 1: +2 Total  assist Stand Pivot Transfers: Patient Percentage: 60% Details for Transfer Assistance: cues for safety and sequence.  Used RW for stand-pivot.  Pt unable to ambulate.  Was able to WB thru LE's but only tolerated about 45 seconds standing. Ambulation/Gait Ambulation/Gait Assistance: Other (comment) (Pt unable)        PT Goals Acute Rehab PT Goals Time For Goal Achievement: 04/03/12 Potential to Achieve Goals: Good PT Goal: Rolling Supine to Left Side - Progress: Progressing toward goal PT Goal: Supine/Side to Sit - Progress: Not progressing PT Goal: Sit to Stand - Progress: Not progressing PT Goal: Stand to Sit - Progress: Not progressing PT Goal: Ambulate - Progress: Not progressing  Visit Information  Last PT Received On: 03/30/12 Assistance Needed: +2    Subjective Data  Subjective: "I think I'm going to die."   Cognition  Overall Cognitive Status: Impaired Area of Impairment: Attention;Following commands;Safety/judgement;Problem solving Arousal/Alertness: Lethargic Orientation Level: Disoriented to;Place;Situation Behavior During Session: Lethargic Current Attention Level: Focused Following Commands: Follows one step commands inconsistently;Follows one step commands with increased time Safety/Judgement: Decreased awareness of safety precautions;Decreased awareness of need for assistance Problem Solving: Pt required max directional cues for technique Cognition - Other Comments: Pt slow to process cues, required repeated cueing throughout, max directional cues for safe technique    Balance  Balance Balance Assessed: Yes Static Sitting Balance Static Sitting - Balance Support: Bilateral upper extremity supported;Feet supported Static Sitting - Level of Assistance: 5: Stand by assistance Static Sitting - Comment/# of Minutes: 5  End of Session PT - End of Session Equipment Utilized During Treatment: Gait belt;Back brace Activity Tolerance: Patient limited by  pain;Treatment limited secondary to medical  complications (Comment) (Lethargy) Patient left: in chair;with call bell/phone within reach;with family/visitor present Nurse Communication: Mobility status     Newell Coral 03/30/2012, 10:27 AM  Newell Coral, PTA Acute Rehab [phone] (office)

## 2012-03-30 NOTE — Progress Notes (Signed)
Placed pt. On cpap auto titrate. Pt.is tolerating well at this time.

## 2012-03-31 DIAGNOSIS — M4716 Other spondylosis with myelopathy, lumbar region: Secondary | ICD-10-CM

## 2012-03-31 DIAGNOSIS — C61 Malignant neoplasm of prostate: Secondary | ICD-10-CM

## 2012-03-31 DIAGNOSIS — Q762 Congenital spondylolisthesis: Secondary | ICD-10-CM

## 2012-03-31 LAB — GLUCOSE, CAPILLARY
Glucose-Capillary: 97 mg/dL (ref 70–99)
Glucose-Capillary: 125 mg/dL — ABNORMAL HIGH (ref 70–99)

## 2012-03-31 NOTE — Progress Notes (Signed)
Physical Therapy Treatment Patient Details Name: [name] MRN: [id number] DOB: 1946/02/14 Today's Date: 03/31/2012 Time: 4782-9562 PT Time Calculation (min): 20 min  PT Assessment / Plan / Recommendation Comments on Treatment Session  Pt easily arouses, then begins to drift off to sleep again. RN reports he walked in the hallway a short distance this a.m. and was much clearer--?effects of medicine.    Follow Up Recommendations  Skilled nursing facility    Barriers to Discharge        Equipment Recommendations  Defer to next venue    Recommendations for Other Services    Frequency Min 5X/week   Plan Frequency remains appropriate;Discharge plan needs to be updated    Precautions / Restrictions Precautions Precautions: Back Precaution Comments: Pt confused and unable to recall precautions or educate Required Braces or Orthoses: Spinal Brace Spinal Brace: Applied in sitting position   Pertinent Vitals/Pain None noted    Mobility  Bed Mobility Bed Mobility: Not assessed Transfers Transfers: Sit to Stand;Stand to Sit Sit to Stand: 1: +2 Total assist;With upper extremity assist;With armrests;From chair/3-in-1 Sit to Stand: Patient Percentage: 70% Stand to Sit: 1: +2 Total assist;Without upper extremity assist;To chair/3-in-1 Stand to Sit: Patient Percentage: 40% Details for Transfer Assistance: stood from recliner x 3; first 2 attempts, pt just achieved standing with hands on RW and pt's knees began bobbing/buckling and then he suddently sat into chair; on 3rd attempt, pt appeared more alert and was able to hold full upright x 30 seconds, walked a short distance, and then sat abruptly (chair was following) Ambulation/Gait Ambulation/Gait Assistance: 1: +2 Total assist Ambulation/Gait: Patient Percentage: 70% Ambulation Distance (Feet): 3 Feet Assistive device: Rolling walker Ambulation/Gait Assistance Details: Pt required assist for balance and to progress RW. Gait  Pattern: Step-to pattern;Decreased stride length;Right foot flat;Left foot flat;Shuffle    Exercises     PT Diagnosis:    PT Problem List:   PT Treatment Interventions:     PT Goals Acute Rehab PT Goals Pt will go Sit to Stand: with modified independence PT Goal: Sit to Stand - Progress: Progressing toward goal Pt will go Stand to Sit: with modified independence PT Goal: Stand to Sit - Progress: Progressing toward goal Pt will Ambulate: >150 feet;with modified independence;with least restrictive assistive device PT Goal: Ambulate - Progress: Progressing toward goal  Visit Information  Last PT Received On: 03/31/12 Assistance Needed: +2    Subjective Data  Subjective: "I feel like everything is going to the right."    Cognition  Overall Cognitive Status: Impaired Area of Impairment: Safety/judgement;Awareness of deficits Arousal/Alertness: Lethargic Orientation Level: Disoriented to;Place;Situation;Time Behavior During Session: Lethargic Following Commands: Follows one step commands inconsistently;Follows one step commands with increased time Safety/Judgement - Other Comments: began sitting when unaware if chair was there for him to sit on Awareness of Deficits: unable to describe the feelings/problems he was having with standing    Balance     End of Session PT - End of Session Equipment Utilized During Treatment: Gait belt;Back brace Activity Tolerance: Other (comment) (limited by lethargy and knee buckling) Patient left: in chair;with call bell/phone within reach;with family/visitor present Nurse Communication: Mobility status   GP     Collan Schoenfeld 03/31/2012, 3:53 PM Pager [phone]

## 2012-03-31 NOTE — Consult Note (Signed)
Physical Medicine and Rehabilitation Consult Reason for Consult: Lumbar radiculopathy Referring Physician: Dr. Jeral Fruit   HPI: [name] is a 66 y.o. right-handed male admitted 03/26/2012 with low back pain radiating to bilateral lower extremities. X-ray and imaging revealed lumbar 2-3, 3-4 and 4-5 stenosis with chronic radiculopathy. Underwent bilateral lumbar 2-3-4-5 laminectomy as well as decompression of the foramina at lumbar L5-S1 as well as discectomy at lumbar 2-5 03/27/2012 per Dr. Jeral Fruit. Back brace when out of bed could be applied in the sitting position. Postoperative confusion felt to be narcotic related and monitored. Subcutaneous heparin for DVT prophylaxis. Low-grade temperature 102 chest x-ray negative on 03/30/2012. Physical and occupational therapy ongoing mobility has been slow and M.D. is requested physical medicine rehabilitation consult to consider inpatient rehabilitation services   Review of Systems  Genitourinary: Positive for urgency.  Musculoskeletal: Positive for myalgias and back pain.  Neurological: Positive for weakness and headaches.  All other systems reviewed and are negative.   Past Medical History  Diagnosis Date  . Hyperlipidemia   . Personal history of malignant neoplasm of prostate   . Malignant neoplasm of bladder, part unspecified   . Peripheral vascular disease, unspecified   . Personal history of other diseases of digestive system   . Osteoarthrosis, unspecified whether generalized or localized, unspecified site   . Other constipation   . Diabetes mellitus   . Gouty arthritis   . Prolapsed cervical disc   . Carotid stenosis, right     severe, asymptomatic  . Obesity   . Cellulitis Apr 25, 2011    right leg  . Coronary artery disease   . Sepsis march 2013  . Hypertension     dr Charlesetta Garibaldi   [place]    [facility]  . Gout   . Sleep apnea     cpap   last sleep study >5 yrs  . OSA on CPAP    Past Surgical History  Procedure Date  .  Cervical fusion   . Appendectomy   . Brain surgery 2011  . Septoplasty   . Cataract extraction, bilateral   . Colonoscopy 04/2011  . Back surgery     x 2 cervical   Family History  Problem Relation Age of Onset  . Cancer Other     Family hx  . Diabetes Other     family hx  . Arthritis Other     family hx  . Cancer Mother   . Heart disease Father     MI  . Colon cancer Brother    Social History:  reports that he has quit smoking. His smoking use included Cigarettes. He has a 40 pack-year smoking history. He has never used smokeless tobacco. He reports that he does not drink alcohol or use illicit drugs. Allergies:  Allergies  Allergen Reactions  . Pregabalin Nausea Only and Other (See Comments)    Make me feel "bad"   Medications Prior to Admission  Medication Sig Dispense Refill  . acetaminophen (TYLENOL) 500 MG tablet Take 500 mg by mouth every 6 (six) hours as needed. For pain      . allopurinol (ZYLOPRIM) 300 MG tablet Take 300 mg by mouth at bedtime.       Marland Kitchen amLODipine (NORVASC) 5 MG tablet Take 1 tablet (5 mg total) by mouth daily.  30 tablet  6  . aspirin 81 MG EC tablet Take 162 mg by mouth at bedtime.       . cholecalciferol (VITAMIN D) 1000 UNITS tablet  Take 1,000 Units by mouth daily.      . clopidogrel (PLAVIX) 75 MG tablet Take 1 tablet (75 mg total) by mouth daily with breakfast.  30 tablet  6  . colchicine 0.6 MG tablet Take 0.6 mg by mouth daily as needed. For gout      . diclofenac (VOLTAREN) 75 MG EC tablet Take 75 mg by mouth 2 (two) times daily. For 1 to 2 weeks routinely      . docusate sodium (COLACE) 100 MG capsule Take 200 mg by mouth at bedtime.      . furosemide (LASIX) 40 MG tablet Take 40 mg by mouth daily. 1 tablet po prn for SOB or swelling       . gabapentin (NEURONTIN) 300 MG capsule Take 300 mg by mouth 3 (three) times daily.        Marland Kitchen glimepiride (AMARYL) 2 MG tablet Take 2 mg by mouth 2 (two) times daily.        Marland Kitchen HYDROcodone-acetaminophen  (NORCO) 5-325 MG per tablet Take 1 tablet by mouth every 6 (six) hours as needed. For pain      . insulin detemir (LEVEMIR) 100 UNIT/ML injection Inject 8 Units into the skin at bedtime.       Marland Kitchen labetalol (NORMODYNE) 300 MG tablet Take 300 mg by mouth 2 (two) times daily.        Marland Kitchen losartan (COZAAR) 100 MG tablet Take 1 tablet (100 mg total) by mouth daily.  30 tablet  6  . niacin (NIASPAN) 1000 MG CR tablet Take 2,000 mg by mouth at bedtime.       Marland Kitchen omega-3 acid ethyl esters (LOVAZA) 1 G capsule Take 1 g by mouth 2 (two) times daily.       . Potassium Gluconate 595 MG CAPS Take 1 capsule by mouth daily.      . rosuvastatin (CRESTOR) 10 MG tablet Take 10 mg by mouth daily.      . sitaGLIPtan (JANUVIA) 100 MG tablet Take 100 mg by mouth daily.        . Tamsulosin HCl (FLOMAX) 0.4 MG CAPS Take 0.4 mg by mouth at bedtime.        Home: Home Living Lives With: Spouse Available Help at Discharge: Family Type of Home: House Home Access: Stairs to enter Secretary/administrator of Steps: 2 Entrance Stairs-Rails: None Home Layout: One level Bathroom Shower/Tub: Forensic scientist: Standard Bathroom Accessibility: Yes How Accessible: Accessible via walker Home Adaptive Equipment: None Additional Comments: Wife unable to assist pt. physically  Functional History: Prior Function Able to Take Stairs?: Yes Driving: Yes Vocation: Retired Functional Status:  Mobility: Bed Mobility Bed Mobility: Sit to Supine;Sit to Sidelying Left Rolling Left: 3: Mod assist;With rail Left Sidelying to Sit: 1: +2 Total assist;HOB flat;With rails Left Sidelying to Sit: Patient Percentage: 50% Supine to Sit: 1: +1 Total assist;HOB elevated (used pad to (A)) Sitting - Scoot to Edge of Bed: 3: Mod assist;With rail Sit to Supine: 1: +2 Total assist;HOB flat Sit to Supine: Patient Percentage: 30% Sit to Sidelying Left: 1: +2 Total assist;HOB flat Sit to Sidelying Left: Patient Percentage:  30% Transfers Transfers: Sit to Stand;Stand to Sit;Stand Pivot Transfers Sit to Stand: 1: +2 Total assist;From chair/3-in-1 Sit to Stand: Patient Percentage: 70% Stand to Sit: 1: +2 Total assist;To bed Stand to Sit: Patient Percentage: 70% Stand Pivot Transfers: 1: +2 Total assist Stand Pivot Transfers: Patient Percentage: 60% Ambulation/Gait Ambulation/Gait Assistance: Not tested (comment) Ambulation Distance (  Feet): 2 Feet Assistive device: Rolling walker Ambulation/Gait Assistance Details: Assist for balance with distance greatly limited by pain.  Cues for tall posture and safety inside RW. Gait Pattern: Decreased stride length;Shuffle;Antalgic;Trunk flexed Stairs: No Wheelchair Mobility Wheelchair Mobility: No  ADL: ADL Eating/Feeding: Simulated;Independent Where Assessed - Eating/Feeding: Chair Grooming: Performed;Teeth care;+1 Total assistance Where Assessed - Grooming: Supported sitting Upper Body Bathing: Simulated;Minimal assistance Where Assessed - Upper Body Bathing: Supported sitting Lower Body Bathing: Simulated;Maximal assistance Where Assessed - Lower Body Bathing: Supported sit to stand Upper Body Dressing: Performed;Maximal assistance Where Assessed - Upper Body Dressing: Supported sitting Lower Body Dressing: Performed;+1 Total assistance Where Assessed - Lower Body Dressing: Supported sit to Pharmacist, hospital: Chief of Staff Method: Sit to Barista: Regular height toilet Equipment Used: Gait belt;Back brace;Rolling walker Transfers/Ambulation Related to ADLs: +2 total A/. Pt @ 70. Transfer 50% stand pivot. ADL Comments: total A with LB ADL  Cognition: Cognition Arousal/Alertness: Lethargic Orientation Level: Oriented to person;Oriented to place Cognition Overall Cognitive Status: Impaired Area of Impairment: Attention;Following commands;Safety/judgement;Problem solving Arousal/Alertness:  Lethargic Orientation Level: Disoriented to;Place;Situation Behavior During Session: Lethargic Current Attention Level: Focused Attention - Other Comments: Pt demonstrated sustained attention to oral care for 15 seconds. Following Commands: Follows one step commands inconsistently;Follows one step commands with increased time Safety/Judgement: Decreased awareness of safety precautions;Decreased awareness of need for assistance Problem Solving: Pt required max directional cues for technique Cognition - Other Comments: Pt slow to process cues, required repeated cueing throughout, max directional cues for safe technique  Blood pressure 135/61, pulse 80, temperature 98.3 F (36.8 C), temperature source Oral, resp. rate 20, height 5\' 9"  (1.753 m), weight 110.315 kg (243 lb 3.2 oz), SpO2 92.00%. Physical Exam  Vitals reviewed. Constitutional: He is oriented to person, place, and time.  HENT:  Head: Normocephalic and atraumatic.  Right Ear: External ear normal.  Left Ear: External ear normal.  Eyes: Conjunctivae are normal. Pupils are equal, round, and reactive to light. Left eye exhibits no discharge. No scleral icterus.  Neck: Neck supple. No thyromegaly present.  Cardiovascular: Normal rate and regular rhythm.   Pulmonary/Chest: Breath sounds normal. No respiratory distress.  Abdominal: Bowel sounds are normal. He exhibits no distension.  Neurological: He is alert and oriented to person, place, and time.       Follows three-step commands. Right eye with weakness medially. UE strength functional. Has pain with proximal Lower extremity movement, associated weakness. Mild stocking glove sensory loss.  Fair awareness and insight  Skin:       Back incision is dressed without drainage, wearing lumbar brace  Psychiatric: He has a normal mood and affect. His behavior is normal.    Results for orders placed during the hospital encounter of 03/26/12 (from the past 24 hour(s))  GLUCOSE, CAPILLARY      Status: Abnormal   Collection Time   03/30/12 11:35 AM      Component Value Range   Glucose-Capillary 132 (*) 70 - 99 mg/dL  GLUCOSE, CAPILLARY     Status: Normal   Collection Time   03/30/12  4:19 PM      Component Value Range   Glucose-Capillary 97  70 - 99 mg/dL  GLUCOSE, CAPILLARY     Status: Normal   Collection Time   03/30/12 10:35 PM      Component Value Range   Glucose-Capillary 89  70 - 99 mg/dL   Comment 1 Notify RN     Comment 2  Documented in Chart    GLUCOSE, CAPILLARY     Status: Abnormal   Collection Time   03/31/12  7:27 AM      Component Value Range   Glucose-Capillary 125 (*) 70 - 99 mg/dL   Comment 1 Documented in Chart     Comment 2 Notify RN     Dg Chest 2 View  03/30/2012  *RADIOLOGY REPORT*  Clinical Data: Fever.  Postop from lumbar spine fusion  CHEST - 2 VIEW  Comparison: 03/24/2012  Findings: Low lung volumes are seen however both lungs are clear. The heart size is stable.  No evidence of pleural effusion.  No mass or lymphadenopathy identified.  Cervical spine fusion hardware again noted.  IMPRESSION: Low lung volumes.  No active cardiopulmonary disease.  Original Report Authenticated By: Danae Orleans, M.D.    Assessment/Plan: Diagnosis: lumbar stenosis, spondlosis, s/p lumbar decompression L2-S1, post-op encephalopathy 1. Does the need for close, 24 hr/day medical supervision in concert with the patient's rehab needs make it unreasonable for this patient to be served in a less intensive setting? Yes 2. Co-Morbidities requiring supervision/potential complications: prostate ca, gout, DM, CAS, PVD, OA 3. Due to bladder management, bowel management, safety, skin/wound care, disease management, medication administration, pain management and patient education, does the patient require 24 hr/day rehab nursing? Yes 4. Does the patient require coordinated care of a physician, rehab nurse, PT (1-2 hrs/day, 5 days/week) and OT (1-2 hrs/day, 5 days/week) to address  physical and functional deficits in the context of the above medical diagnosis(es)? Yes Addressing deficits in the following areas: balance, endurance, locomotion, strength, transferring, bowel/bladder control, bathing, dressing, feeding, grooming, toileting and psychosocial support 5. Can the patient actively participate in an intensive therapy program of at least 3 hrs of therapy per day at least 5 days per week? Yes 6. The potential for patient to make measurable gains while on inpatient rehab is excellent 7. Anticipated functional outcomes upon discharge from inpatient rehab are supevision with PT, supervision to min assist with OT,  8. Estimated rehab length of stay to reach the above functional goals is: 7-10 days 9. Does the patient have adequate social supports to accommodate these discharge functional goals? Yes 10. Anticipated D/C setting: Home 11. Anticipated post D/C treatments: HH therapy 12. Overall Rehab/Functional Prognosis: excellent  RECOMMENDATIONS: This patient's condition is appropriate for continued rehabilitative care in the following setting: CIR Patient has agreed to participate in recommended program. Yes Note that insurance prior authorization may be required for reimbursement for recommended care.  Comment: Rehab RN to follow up.   Ivory Broad, MD   03/31/2012

## 2012-03-31 NOTE — Progress Notes (Signed)
Patient ID: [name], male   DOB: December 10, 1945, 66 y.o.   MRN: [id number] Chest xrays negative for pneumonia. Temp down. Wound dry. Today mentally he is much better. To ger rehabilitation medicine

## 2012-04-01 LAB — GLUCOSE, CAPILLARY
Glucose-Capillary: 99 mg/dL (ref 70–99)
Glucose-Capillary: 87 mg/dL (ref 70–99)

## 2012-04-01 MED ORDER — BISACODYL 10 MG RE SUPP: 10.0000 mg | Freq: Every day | RECTAL | Status: DC | PRN

## 2012-04-01 NOTE — Progress Notes (Signed)
Patient ID: [name], male   DOB: 08-Aug-1946, 66 y.o.   MRN: [id number] No fever. Decrease of incisional pain. No weakness. Still confused. Not a candidate to go to rehabilitation.

## 2012-04-01 NOTE — Progress Notes (Signed)
Patient [name], 66 year old white male resident of [place], [place] enjoys the emotional support of his wife.  Patient thanked Orthoptist for providing pastoral prayer, presence, and conversation.  I will follow-up as needed.

## 2012-04-01 NOTE — Progress Notes (Signed)
Patient ID: GIBSON LAD, male   DOB: 1946/08/06, 66 y.o.   MRN: [id number] No fever. Still not very active. On no diazepam, ambiens and decrease of oxycodone, will ger a consult to see if he can go to a SNF

## 2012-04-01 NOTE — Progress Notes (Signed)
Physical Therapy Treatment Patient Details Name: [name] MRN: [id number] DOB: 06-May-1946 Today's Date: 04/01/2012 Time: 5621-3086 PT Time Calculation (min): 16 min  PT Assessment / Plan / Recommendation Comments on Treatment Session  PT called into room as patient attempting to get out of recliner by himself. Patient educated on importance on not getting up without assistance as he is unaware of his need for assistance and his deficits. Patients wife present with him and attempting to keep him in recliner until assistance available. Patient progressing with mobiilty some but cognition and safety awareness still tend to be the biggest barrier to progression at this time. Patients wife has had back surgery as well and is aware of precautions and familiar with back brace and education.     Follow Up Recommendations  Skilled nursing facility    Barriers to Discharge        Equipment Recommendations  Defer to next venue    Recommendations for Other Services    Frequency Min 5X/week   Plan Discharge plan remains appropriate;Frequency remains appropriate    Precautions / Restrictions Precautions Precautions: Back Precaution Comments: Patient able to state no twisting, turning, or bending. Cues required for no arching. Patient needed cues to follow precautions with mobility.  Required Braces or Orthoses: Spinal Brace Spinal Brace: Applied in sitting position Restrictions Weight Bearing Restrictions: No   Pertinent Vitals/Pain 8/10 back pain    Mobility  Bed Mobility Rolling Left: 3: Mod assist;With rail Sit to Sidelying Left: 3: Mod assist Details for Bed Mobility Assistance: A required to ensure safe technique back into bed with positioning of shoulders and LEs to prevent breaknig of precautions. Cues for rolling all at once to enusre log roll Transfers Sit to Stand: 1: +2 Total assist;From chair/3-in-1;With upper extremity assist;With armrests Sit to Stand: Patient Percentage:  80% Stand to Sit: 3: Mod assist;With upper extremity assist;To bed Ambulation/Gait Ambulation/Gait Assistance: 3: Mod assist Ambulation Distance (Feet): 5 Feet Assistive device: Rolling walker Ambulation/Gait Assistance Details: A for balance and for safe positioning of RW.  Gait Pattern: Step-through pattern;Decreased stride length Stairs: No Wheelchair Mobility Wheelchair Mobility: No    Exercises     PT Diagnosis:    PT Problem List:   PT Treatment Interventions:     PT Goals Acute Rehab PT Goals PT Goal: Rolling Supine to Left Side - Progress: Progressing toward goal PT Goal: Sit to Supine/Side - Progress: Progressing toward goal PT Goal: Sit to Stand - Progress: Progressing toward goal PT Goal: Stand to Sit - Progress: Progressing toward goal PT Goal: Ambulate - Progress: Progressing toward goal  Visit Information  Last PT Received On: 04/01/12 Assistance Needed: +2    Subjective Data      Cognition  Overall Cognitive Status: Impaired Area of Impairment: Safety/judgement Arousal/Alertness: Lethargic Orientation Level: Appears intact for tasks assessed Behavior During Session: Lethargic Following Commands: Follows one step commands with increased time    Balance     End of Session PT - End of Session Equipment Utilized During Treatment: Gait belt;Back brace Activity Tolerance: Patient limited by fatigue;Patient limited by pain Patient left: in bed Nurse Communication: Mobility status   GP     Fredrich Birks 04/01/2012, 9:26 AM 04/01/2012 Fredrich Birks PTA [phone] pager [phone] office

## 2012-04-02 LAB — GLUCOSE, CAPILLARY
Glucose-Capillary: 101 mg/dL — ABNORMAL HIGH (ref 70–99)
Glucose-Capillary: 83 mg/dL (ref 70–99)
Glucose-Capillary: 97 mg/dL (ref 70–99)

## 2012-04-02 MED ORDER — TRAMADOL HCL 50 MG PO TABS
50.0000 mg | ORAL_TABLET | Freq: Four times a day (QID) | ORAL | Status: DC
  Administered 2012-04-02 – 2012-04-04 (×9): 50 mg via ORAL
  Filled 2012-04-02 (×13): qty 1

## 2012-04-02 NOTE — Progress Notes (Signed)
Rehab admissions - Evaluated for possible admission.  I spoke with insurance case Production designer, theatre/television[employer].  I will not be able to get approval for inpatient rehab admission.  Options will be SNF or HH follow-up if patient makes sufficient progress.  Call me for questions.  #[phone]

## 2012-04-02 NOTE — Clinical Social Work Psychosocial (Signed)
     Clinical Social Work Department BRIEF PSYCHOSOCIAL ASSESSMENT 04/02/2012  Patient:  [name], [name]     Account Number:  [id number]     Admit date:  03/26/2012  Clinical Social Worker:  Peggyann Shoals  Date/Time:  04/02/2012 05:05 PM  Referred by:  Physician  Date Referred:  04/02/2012 Referred for  SNF Placement   Other Referral:   Interview type:  Family Other interview type:    PSYCHOSOCIAL DATA Living Status:  WIFE Admitted from facility:   Level of care:   Primary support name:  Verle Wheeling Primary support relationship to patient:  SPOUSE Degree of support available:   Supportive. Pt also has a supportive daughter.    CURRENT CONCERNS Current Concerns  Post-Acute Placement   Other Concerns:    SOCIAL WORK ASSESSMENT / PLAN CSW was reconsulted for SNF by physican as pt has not progressed as needed for home health to be a safe discharge option. CSW met with pt and daughter to address consult. CSW introduced herself and explained role of social work. CSW also explained the SNF referral process. Pt lives with his wife and has a supportive daughter. Pt shared that he is interested in short term SNF for rehab.    CSW will initate SNF referral and follow up with bed offers. CSW will continue to follow.   Assessment/plan status:   Other assessment/ plan:   Information/referral to community resources:   SNF list    PATIENTS/FAMILYS RESPONSE TO PLAN OF CARE: Pt is alert and oriented. Pt was pleasant and agreeable to discharge plan.

## 2012-04-02 NOTE — Progress Notes (Signed)
Patient ID: [name], male   DOB: July 20, 1946, 66 y.o.   MRN: [id number] More awake. Poor activity. Abdomen soft. Plan to be dc to snf.

## 2012-04-02 NOTE — Progress Notes (Signed)
Physical Therapy Treatment Patient Details Name: [name] MRN: [id number] DOB: [birth year] Today's Date: 04/02/2012 Time: 1308-6578 PT Time Calculation (min): 23 min  PT Assessment / Plan / Recommendation Comments on Treatment Session  Pt very lethargic and weak.  Pt educated in LAQ and seated marching to increase strength in LE.  Pt to doorway with several standing rest breaks, and one seated rest break.  Pt sat without warning and was not aware that chair was following him.  Educated pt on importance of safety and risk of fall.  Pt stated that "I don't care, I needed to sit."  Pt will benefit from continued PT to increase mobility and build strength/endurance.  Continue with POC.      Follow Up Recommendations  Skilled nursing facility    Barriers to Discharge        Equipment Recommendations  Defer to next venue    Recommendations for Other Services    Frequency Min 5X/week   Plan Discharge plan remains appropriate;Frequency remains appropriate    Precautions / Restrictions Precautions Precautions: Back Required Braces or Orthoses: Spinal Brace Spinal Brace: Lumbar corset;Applied in sitting position Restrictions Weight Bearing Restrictions: No   Pertinent Vitals/Pain     Mobility  Bed Mobility Bed Mobility: Not assessed Transfers Sit to Stand: 1: +2 Total assist;From chair/3-in-1;With upper extremity assist;With armrests Sit to Stand: Patient Percentage: 70% Stand to Sit: 1: +2 Total assist;To chair/3-in-1;With upper extremity assist;With armrests Stand to Sit: Patient Percentage: 80% Details for Transfer Assistance: Pt req +2 assistance to extend trunk/hips/knees to come to upright standing.  Pt req max verbal and tactile cues for hand placement on sit/stand.   Ambulation/Gait Ambulation/Gait Assistance: 1: +2 Total assist (+1 to follow w/ chair) Ambulation/Gait: Patient Percentage: 80% Ambulation Distance (Feet): 15 Feet Assistive device: Rolling  walker Ambulation/Gait Assistance Details: Pt required +2 assist for safety and balance, and VC to initiate steps.  Pt sat without warning into chair following behind. Gait Pattern: Decreased stride length;Step-through pattern;Antalgic;Trunk flexed;Left flexed knee in stance;Right flexed knee in stance Gait velocity: very slow pace Stairs: No    Exercises General Exercises - Lower Extremity Long Arc Quad: Strengthening;Both;10 reps;Seated Hip Flexion/Marching: Strengthening;Both;10 reps;Seated   PT Diagnosis:    PT Problem List:   PT Treatment Interventions:     PT Goals Acute Rehab PT Goals PT Goal: Sit to Stand - Progress: Progressing toward goal PT Goal: Stand to Sit - Progress: Progressing toward goal PT Goal: Ambulate - Progress: Progressing toward goal  Visit Information  Last PT Received On: 04/02/12 Assistance Needed: +2 (+1 to follow with chair)    Subjective Data  Subjective: "I am just so weak.  I can't do anything."   Cognition  Overall Cognitive Status: Impaired Area of Impairment: Safety/judgement;Awareness of deficits;Awareness of errors;Following commands Arousal/Alertness: Lethargic Orientation Level: Appears intact for tasks assessed Behavior During Session: Lethargic Safety/Judgement: Decreased awareness of safety precautions;Decreased awareness of need for assistance;Impulsive Cognition - Other Comments: pt slow to process, pt required repeated VC throughout for safe technique    Balance  Static Standing Balance Static Standing - Balance Support: Bilateral upper extremity supported Static Standing - Level of Assistance: 1: +2 Total assist;Patient percentage (comment) (pt 80%) Static Standing - Comment/# of Minutes: Pt stood 3x during ambulation.  Req assistance to extend knees and decrease weight bearing through UE  End of Session PT - End of Session Equipment Utilized During Treatment: Gait belt;Back brace Activity Tolerance: Patient limited by  pain Patient left:  in chair;with call bell/phone within reach Nurse Communication: Mobility status   GP     Ephraim Hamburger, SPTA 04/02/2012, 1:59 PM

## 2012-04-02 NOTE — Clinical Social Work Placement (Addendum)
    Clinical Social Work Department CLINICAL SOCIAL WORK PLACEMENT NOTE 04/02/2012  Patient:  [name], [name]  Account Number:  [id number] Admit date:  03/26/2012  Clinical Social Worker:  Peggyann Shoals  Date/time:  04/02/2012 05:18 PM  Clinical Social Work is seeking post-discharge placement for this patient at the following level of care:   SKILLED NURSING   (*CSW will update this form in Epic as items are completed)   04/02/2012  Patient/family provided with [facility] Department of Clinical Social Work's list of facilities offering this level of care within the geographic area requested by the patient (or if unable, by the patient's family).  04/02/2012  Patient/family informed of their freedom to choose among providers that offer the needed level of care, that participate in Medicare, Medicaid or managed care program needed by the patient, have an available bed and are willing to accept the patient.  04/02/2012  Patient/family informed of [facility]' ownership interest in [facility], as well as of the fact that they are under no obligation to receive care at this facility.  PASARR submitted to EDS on 04/02/2012 PASARR number received from EDS on 04/02/2012  FL2 transmitted to all facilities in geographic area requested by pt/family on  04/02/2012 FL2 transmitted to all facilities within larger geographic area on   Patient informed that his/her managed care company has contracts with or will negotiate with  certain facilities, including the following:     Patient/family informed of bed offers received:  04/03/2012 Patient chooses bed at [facility] Physician recommends and patient chooses bed at  SNF  Patient to be transferred to [facility] at 04/04/2012 Patient to be transferred to facility by [facility]  The following physician request were entered in Epic:   Additional Comments:

## 2012-04-02 NOTE — Progress Notes (Signed)
Seen and agreed 04/02/2012 Robinette, Julia Elizabeth PTA [phone] pager [phone] office    

## 2012-04-02 NOTE — Progress Notes (Signed)
Occupational Therapy Treatment Patient Details Name: [name] MRN: [id number] DOB: 1946/03/30 Today's Date: 04/02/2012 Time: 2841-3244 OT Time Calculation (min): 20 min  OT Assessment / Plan / Recommendation Comments on Treatment Session pt progressing, but remains limited by pain and dec safety awareness/cognition    Follow Up Recommendations  Skilled nursing facility    Barriers to Discharge       Equipment Recommendations  Defer to next venue    Recommendations for Other Services    Frequency     Plan Discharge plan remains appropriate    Precautions / Restrictions Precautions Precautions: Back Required Braces or Orthoses: Spinal Brace Spinal Brace:  (already on upon entrance into room) Restrictions Weight Bearing Restrictions: No   Pertinent Vitals/Pain Pt indicated low back pain but did not rate; pt pre-medicated    ADL  Grooming: Performed;Wash/dry hands;Moderate assistance Where Assessed - Grooming: Supported standing Toilet Transfer: Chief of Staff: Patient Percentage: 70% Statistician Method: Sit to Barista:  (to/from chair simulating 3n1) Transfers/Ambulation Related to ADLs: +2total A(pt=80%) with RW ambulation with assist needed for safety and to advance RW; pt sat without warning, stating he was in pain ADL Comments: Pt continues with questionable cognitive deficits- pt sat during ambulation without warning and with no regard as to whether or not there was a chair behind him (PT was following with chair unbeknownst to pt).    OT Diagnosis:    OT Problem List:   OT Treatment Interventions:     OT Goals Acute Rehab OT Goals OT Goal Formulation: With patient ADL Goals Pt Will Perform Lower Body Bathing: with supervision;Sit to stand from chair;Sit to stand from bed Pt Will Perform Lower Body Dressing: with supervision;with adaptive equipment;Sit to stand from bed;Sit to stand from chair Pt Will  Transfer to Toilet: with supervision;Ambulation;3-in-1 ADL Goal: Toilet Transfer - Progress: Progressing toward goals ADL Goal: Toileting - Clothing Manipulation - Progress: Progressing toward goals Additional ADL Goal #1: Pt. will be independent with back precautions ADL Goal: Additional Goal #1 - Progress: Progressing toward goals Additional ADL Goal #2: Pt. will independently don/doff brace  Visit Information  Last OT Received On: 04/02/12 Assistance Needed: +2 PT/OT Co-Evaluation/Treatment: Yes    Subjective Data      Prior Functioning       Cognition  Overall Cognitive Status: Impaired Area of Impairment: Safety/judgement;Awareness of deficits;Awareness of errors;Following commands Arousal/Alertness: Lethargic Orientation Level: Appears intact for tasks assessed Behavior During Session: Lethargic Safety/Judgement: Decreased awareness of safety precautions;Decreased awareness of need for assistance;Impulsive Cognition - Other Comments: pt slow to process, pt required repeated VC throughout for safe technique    Mobility Bed Mobility Bed Mobility: Not assessed Transfers Sit to Stand: 1: +2 Total assist;From chair/3-in-1;With upper extremity assist;With armrests Sit to Stand: Patient Percentage: 70% Stand to Sit: 1: +2 Total assist;To chair/3-in-1;With upper extremity assist;With armrests Stand to Sit: Patient Percentage: 80% Details for Transfer Assistance: Pt req +2 assistance to extend trunk/hips/knees to come to upright standing.  Pt req max verbal and tactile cues for hand placement on sit/stand.     Exercises General Exercises - Lower Extremity Long Arc Quad: Strengthening;Both;10 reps;Seated Hip Flexion/Marching: Strengthening;Both;10 reps;Seated  Balance Static Standing Balance Static Standing - Balance Support: Bilateral upper extremity supported Static Standing - Level of Assistance: 1: +2 Total assist;Patient percentage (comment) Static Standing - Comment/# of  Minutes: Pt stood 3x during ambulation. Req assistance to extend knees and decrease weight bearing through UE  End of Session OT - End of Session Equipment Utilized During Treatment: Gait belt Activity Tolerance: Patient limited by pain Patient left: in chair;with call bell/phone within reach;with family/visitor present Nurse Communication: Mobility status  GO     Timmie Dugue 04/02/2012, 2:55 PM

## 2012-04-03 ENCOUNTER — Other Ambulatory Visit: Payer: Self-pay

## 2012-04-03 ENCOUNTER — Encounter (HOSPITAL_COMMUNITY): Payer: Self-pay | Admitting: Radiology

## 2012-04-03 ENCOUNTER — Inpatient Hospital Stay (HOSPITAL_COMMUNITY): Payer: Medicare Other

## 2012-04-03 DIAGNOSIS — D649 Anemia, unspecified: Secondary | ICD-10-CM | POA: Diagnosis present

## 2012-04-03 DIAGNOSIS — R079 Chest pain, unspecified: Secondary | ICD-10-CM | POA: Diagnosis present

## 2012-04-03 DIAGNOSIS — E119 Type 2 diabetes mellitus without complications: Secondary | ICD-10-CM

## 2012-04-03 DIAGNOSIS — M48061 Spinal stenosis, lumbar region without neurogenic claudication: Secondary | ICD-10-CM | POA: Diagnosis present

## 2012-04-03 LAB — CARDIAC PANEL(CRET KIN+CKTOT+MB+TROPI)
CK, MB: 2.2 ng/mL (ref 0.3–4.0)
Relative Index: 2 (ref 0.0–2.5)
Total CK: 117 U/L (ref 7–232)
Total CK: 112 U/L (ref 7–232)

## 2012-04-03 LAB — BASIC METABOLIC PANEL
Calcium: 9.4 mg/dL (ref 8.4–10.5)
GFR calc non Af Amer: 66 mL/min — ABNORMAL LOW (ref >90)
Glucose, Bld: 115 mg/dL — ABNORMAL HIGH (ref 70–99)
Sodium: 137 mEq/L (ref 135–145)

## 2012-04-03 LAB — CBC
Platelets: 305 10*3/uL (ref 150–400)
RDW: 13.4 % (ref 11.5–15.5)
WBC: 11.7 10*3/uL — ABNORMAL HIGH (ref 4.0–10.5)

## 2012-04-03 LAB — GLUCOSE, CAPILLARY: Glucose-Capillary: 114 mg/dL — ABNORMAL HIGH (ref 70–99)

## 2012-04-03 MED ORDER — IOHEXOL 350 MG/ML SOLN
100.0000 mL | Freq: Once | INTRAVENOUS | Status: AC | PRN
  Administered 2012-04-03: 100 mL via INTRAVENOUS

## 2012-04-03 MED ORDER — ASPIRIN 325 MG PO TABS
325.0000 mg | ORAL_TABLET | Freq: Every day | ORAL | Status: DC
  Administered 2012-04-03: 325 mg via ORAL
  Filled 2012-04-03 (×2): qty 1

## 2012-04-03 MED ORDER — PANTOPRAZOLE SODIUM 40 MG PO TBEC
40.0000 mg | DELAYED_RELEASE_TABLET | Freq: Every day | ORAL | Status: DC
  Administered 2012-04-03 – 2012-04-04 (×2): 40 mg via ORAL
  Filled 2012-04-03: qty 1

## 2012-04-03 NOTE — Progress Notes (Signed)
Physical Therapy Treatment Patient Details Name: PAO HAFFEY MRN: [id number] DOB: [birth year] Today's Date: 04/03/2012 Time: 1030-1050 PT Time Calculation (min): 20 min  PT Assessment / Plan / Recommendation Comments on Treatment Session  Pt complaining of weakness and 11/10 pain.  Pt was willing to ambulate and change position.  Pt ambulated within room, with chair following closely behind.  Pt was limited by pain and weakness.  Pt will benefit from continued PT to increase mobility.  Continue with POC.    Follow Up Recommendations  Skilled nursing facility    Barriers to Discharge        Equipment Recommendations  Defer to next venue    Recommendations for Other Services    Frequency Min 5X/week   Plan Discharge plan remains appropriate;Frequency remains appropriate    Precautions / Restrictions Precautions Precautions: Back Precaution Comments: Pt able to state 2/3 precautions and 3/3 with VC. Required Braces or Orthoses: Spinal Brace Spinal Brace: Lumbar corset;Applied in sitting position Restrictions Weight Bearing Restrictions: No   Pertinent Vitals/Pain 10/10 back pain    Mobility  Bed Mobility Bed Mobility: Rolling Right Rolling Right: 3: Mod assist;With rail Supine to Sit: 3: Mod assist;With rails;HOB elevated Sitting - Scoot to Edge of Bed: 4: Min assist;With rail Details for Bed Mobility Assistance: A required to encourage pt to roll, ensure safe technique.   Pt required A to bring LE off bed and to bring trunk to upright. Transfers Sit to Stand: 1: +2 Total assist Sit to Stand: Patient Percentage: 80% Stand to Sit: 1: +2 Total assist Stand to Sit: Patient Percentage: 80% Details for Transfer Assistance: pt required +2 assist to come to upright and extend knees/hips.  Pt required max verbal and tactile cues for hand placement sit/stand.   Ambulation/Gait Ambulation/Gait Assistance: 1: +2 Total assist (+1 to follow with chair) Ambulation Distance (Feet): 12  Feet Assistive device: Rolling walker Ambulation/Gait Assistance Details: Pt required assist for balance and VC to intiate steps.   Gait Pattern: Antalgic;Shuffle;Decreased stride length;Step-through pattern;Trunk flexed;Left flexed knee in stance;Right flexed knee in stance Gait velocity: very slow pace    Exercises     PT Diagnosis:    PT Problem List:   PT Treatment Interventions:     PT Goals    Visit Information  Last PT Received On: 04/03/12 Assistance Needed: +2 (+1 to follow with chair)    Subjective Data      Cognition  Overall Cognitive Status: Impaired Area of Impairment: Safety/judgement;Awareness of deficits;Awareness of errors Arousal/Alertness: Lethargic Orientation Level: Appears intact for tasks assessed Behavior During Session: Lethargic Following Commands: Follows one step commands with increased time Safety/Judgement: Decreased awareness of safety precautions;Decreased awareness of need for assistance;Impulsive    Balance  Static Sitting Balance Static Sitting - Level of Assistance: 5: Stand by assistance Static Sitting - Comment/# of Minutes: 5 min EOB  End of Session PT - End of Session Equipment Utilized During Treatment: Gait belt;Back brace Activity Tolerance: Patient limited by pain Patient left: in chair;with call bell/phone within reach Nurse Communication: Mobility status   GP     Prestyn Mahn 04/03/2012, 1:27 PM

## 2012-04-03 NOTE — Clinical Social Work Note (Signed)
CSW met with pt and pt's wife to provide bed offers. Pt's wife has chosen [facility]. CSW updated RN. CSW will continue to follow to facilitate discharge.   Dede Query, MSW, Theresia Majors [phone]

## 2012-04-03 NOTE — Progress Notes (Signed)
Seen and agreed 04/03/2012 Robinette, Julia Elizabeth PTA [phone] pager [phone] office       

## 2012-04-03 NOTE — Clinical Social Work Note (Signed)
Pt's discharge is on hold for today, per consulting MD note. [facility] is aware and will be able to accept pt tomorrow. CSW will continue to follow and facilitate discharge when medically stable.   Dede Query, MSW, Theresia Majors [phone]

## 2012-04-03 NOTE — Discharge Summary (Addendum)
Physician Discharge Summary  Patient ID: [name] MRN: [id number] DOB/AGE: 1946/08/28 66 y.o.  Admit date: 03/26/2012 Discharge date: 04/04/12  Admission Diagnoses:ddd l23,34 45. S/p anterior and posterior cervical fusion  Discharge Diagnoses: same   Discharged Condition:poor physical activity.wound dry, no weakness. Incisional pain  Hospital Course:l2 to l5 fusion  Consultspt/ot. Rehabilitation medicine  Significant Diagnostic Studies: lumbar myelogram  Treatments:l2 to l5 fusion  Discharge Exam: Blood pressure 131/61, pulse 70, temperature 97.4 F (36.3 C), temperature source Oral, resp. rate 18, height 5\' 9"  (1.753 m), weight 110.315 kg (243 lb 3.2 oz), SpO2 96.00%. Wound dry. No weakness. No fever. Poor physical activity,  Disposition: snf. Needs aggressive physical therapy. TO SEE ME IN 4 WEEKS OR BEFORE. SPOKE WITH WIFE ABOUT FOLLOW UP. For pain only tramadol or tylenol   Medication List  As of 04/03/2012 11:12 AM   ASK your doctor about these medications         allopurinol 300 MG tablet   Commonly known as: ZYLOPRIM   Take 300 mg by mouth at bedtime.      amLODipine 5 MG tablet   Commonly known as: NORVASC   Take 1 tablet (5 mg total) by mouth daily.      aspirin 81 MG EC tablet   Take 162 mg by mouth at bedtime.      cholecalciferol 1000 UNITS tablet   Commonly known as: VITAMIN D   Take 1,000 Units by mouth daily.      clopidogrel 75 MG tablet   Commonly known as: PLAVIX   Take 1 tablet (75 mg total) by mouth daily with breakfast.      colchicine 0.6 MG tablet   Take 0.6 mg by mouth daily as needed. For gout      diclofenac 75 MG EC tablet   Commonly known as: VOLTAREN   Take 75 mg by mouth 2 (two) times daily. For 1 to 2 weeks routinely      docusate sodium 100 MG capsule   Commonly known as: COLACE   Take 200 mg by mouth at bedtime.      furosemide 40 MG tablet   Commonly known as: LASIX   Take 40 mg by mouth daily. 1 tablet po prn for  SOB or swelling      gabapentin 300 MG capsule   Commonly known as: NEURONTIN   Take 300 mg by mouth 3 (three) times daily.      glimepiride 2 MG tablet   Commonly known as: AMARYL   Take 2 mg by mouth 2 (two) times daily.      HYDROcodone-acetaminophen 5-325 MG per tablet   Commonly known as: NORCO/VICODIN   Take 1 tablet by mouth every 6 (six) hours as needed. For pain      insulin detemir 100 UNIT/ML injection   Commonly known as: LEVEMIR   Inject 8 Units into the skin at bedtime.      labetalol 300 MG tablet   Commonly known as: NORMODYNE   Take 300 mg by mouth 2 (two) times daily.      losartan 100 MG tablet   Commonly known as: COZAAR   Take 1 tablet (100 mg total) by mouth daily.      niacin 1000 MG CR tablet   Commonly known as: NIASPAN   Take 2,000 mg by mouth at bedtime.      omega-3 acid ethyl esters 1 G capsule   Commonly known as: LOVAZA   Take 1 g  by mouth 2 (two) times daily.      Potassium Gluconate 595 MG Caps   Take 1 capsule by mouth daily.      rosuvastatin 10 MG tablet   Commonly known as: CRESTOR   Take 10 mg by mouth daily.      sitaGLIPtin 100 MG tablet   Commonly known as: JANUVIA   Take 100 mg by mouth daily.      Tamsulosin HCl 0.4 MG Caps   Commonly known as: FLOMAX   Take 0.4 mg by mouth at bedtime.      TYLENOL 500 MG tablet   Generic drug: acetaminophen   Take 500 mg by mouth every 6 (six) hours as needed. For pain           cleared by dr Jomarie Longs to go to SNF  Signed: Lodema Parma M 04/03/2012, 11:12 AM

## 2012-04-03 NOTE — Progress Notes (Signed)
Patient ID: [name], male   DOB: 03-Dec-1945, 66 y.o.   MRN: [id number] spoke with wife about SNFepy, no weakness. SLEEPY

## 2012-04-03 NOTE — Progress Notes (Signed)
This patient has received 60 ml's of IV Omnipaque 300 contrast extravasation into right lateral AC during an angio Chest  exam.  The exam was performed on 04/03/12 Site / affected area assessed by Dr. Christiana Pellant

## 2012-04-03 NOTE — Consult Note (Addendum)
Requesting physician: Dr.Botero  Primary Care Physician: Dwana Melena, MD  Cardiologist: Abran Duke  Reason for consultation: Chest pain  History of Present Illness: Mr. themes is a 66 year old male with history of diabetes, hypertension, carotid artery disease, peripheral vascular disease was admitted on 7/10 with worsening symptoms from lumbar spinal stenosis underwent elective lumbar decompression and fusion on 7/11, he has been slowly recuperating with physical therapy and was supposed to be discharged to skilled nursing facility today. But started experiencing chest pain which he describes a sharp and burning, retrosternal without any radiation, this was associated with flushing and diaphoresis, lasted for less than a minute, while this happened his blood pressure was 92/50, down from 130/61 previously He denies any shortness of breath, denies cough congestion fevers or chills [facility] were consulted for further evaluation of his symptoms today   Allergies:   Allergies  Allergen Reactions  . Pregabalin Nausea Only and Other (See Comments)    Make me feel "bad"      Past Medical History  Diagnosis Date  . Hyperlipidemia   . Personal history of malignant neoplasm of prostate   . Malignant neoplasm of bladder, part unspecified   . Peripheral vascular disease, unspecified   . Personal history of other diseases of digestive system   . Osteoarthrosis, unspecified whether generalized or localized, unspecified site   . Other constipation   . Diabetes mellitus   . Gouty arthritis   . Prolapsed cervical disc   . Carotid stenosis, right     severe, asymptomatic  . Obesity   . Cellulitis Apr 25, 2011    right leg  . Coronary artery disease   . Sepsis march 2013  . Hypertension     dr Charlesetta Garibaldi   [place]    [place]  . Gout   . Sleep apnea     cpap   last sleep study >5 yrs  . OSA on CPAP     Past Surgical History  Procedure Date  . Cervical fusion     . Appendectomy   . Brain surgery 2011  . Septoplasty   . Cataract extraction, bilateral   . Colonoscopy 04/2011  . Back surgery     x 2 cervical    Scheduled Meds:   . allopurinol  300 mg Oral QHS  . amLODipine  5 mg Oral Daily  . atorvastatin  10 mg Oral q1800  . furosemide  40 mg Oral Daily  . gabapentin  300 mg Oral TID  . glimepiride  2 mg Oral BID WC  . heparin subcutaneous  5,000 Units Subcutaneous Q8H  . labetalol  300 mg Oral BID  . linagliptin  5 mg Oral Daily  . losartan  100 mg Oral Daily  . Tamsulosin HCl  0.4 mg Oral QHS  . traMADol  50 mg Oral Q6H   Continuous Infusions:  PRN Meds:.acetaminophen, acetaminophen, bisacodyl, menthol-cetylpyridinium, ondansetron (ZOFRAN) IV, phenol, sodium phosphate  Social History:  reports that he has quit smoking. His smoking use included Cigarettes. He has a 40 pack-year smoking history. He has never used smokeless tobacco. He reports that he does not drink alcohol or use illicit drugs.  Family History  Problem Relation Age of Onset  . Cancer Other     Family hx  . Diabetes Other     family hx  . Arthritis Other     family hx  . Cancer Mother   . Heart disease Father     MI  .  Colon cancer Brother     Review of Systems:  Constitutional: Denies fever, chills, diaphoresis, appetite change and fatigue.  HEENT: Denies photophobia, eye pain, redness, hearing loss, ear pain, congestion, sore throat, rhinorrhea, sneezing, mouth sores, trouble swallowing, neck pain, neck stiffness and tinnitus.   Respiratory: Denies SOB, DOE, cough, chest tightness,  and wheezing.   Cardiovascular: Denies chest pain, palpitations and leg swelling.  Gastrointestinal: Denies nausea, vomiting, abdominal pain, diarrhea, constipation, blood in stool and abdominal distention.  Genitourinary: Denies dysuria, urgency, frequency, hematuria, flank pain and difficulty urinating.  Musculoskeletal: Denies myalgias, back pain, joint swelling, arthralgias  and gait problem.  Skin: Denies pallor, rash and wound.  Neurological: Denies dizziness, seizures, syncope, weakness, light-headedness, numbness and headaches.  Hematological: Denies adenopathy. Easy bruising, personal or family bleeding history  Psychiatric/Behavioral: Denies suicidal ideation, mood changes, confusion, nervousness, sleep disturbance and agitation   Physical Exam: Blood pressure 108/59, pulse 73, temperature 98 F (36.7 C), temperature source Oral, resp. rate 18, height 5\' 9"  (1.753 m), weight 110.315 kg (243 lb 3.2 oz), SpO2 98.00%. General exam alert awake oriented x3 in no acute distress HEENT pupils 4 mm reactive to light, oral mucosa moist, neck no JVD CVS S1-S2 regular rate rhythm no murmurs rubs or gallops Lungs poor air movement bilaterally otherwise clear Abdomen soft mildly distended nontender positive bowel sounds no organomegaly Extremities no edema clubbing or cyanosis LS-spine: Surgical incision with dressing Skin without any rashes or breakdown  Labs on Admission:  Results for orders placed during the hospital encounter of 03/26/12 (from the past 48 hour(s))  GLUCOSE, CAPILLARY     Status: Normal   Collection Time   04/01/12  4:58 PM      Component Value Range Comment   Glucose-Capillary 87  70 - 99 mg/dL    Comment 1 Notify RN      Comment 2 Documented in Chart     GLUCOSE, CAPILLARY     Status: Normal   Collection Time   04/01/12  9:33 PM      Component Value Range Comment   Glucose-Capillary 73  70 - 99 mg/dL    Comment 1 Documented in Chart      Comment 2 Notify RN     GLUCOSE, CAPILLARY     Status: Abnormal   Collection Time   04/02/12  6:45 AM      Component Value Range Comment   Glucose-Capillary 101 (*) 70 - 99 mg/dL    Comment 1 Documented in Chart      Comment 2 Notify RN     GLUCOSE, CAPILLARY     Status: Normal   Collection Time   04/02/12 11:39 AM      Component Value Range Comment   Glucose-Capillary 99  70 - 99 mg/dL    Comment  1 Notify RN      Comment 2 Documented in Chart     GLUCOSE, CAPILLARY     Status: Normal   Collection Time   04/02/12  4:45 PM      Component Value Range Comment   Glucose-Capillary 83  70 - 99 mg/dL    Comment 1 Notify RN      Comment 2 Documented in Chart     GLUCOSE, CAPILLARY     Status: Normal   Collection Time   04/02/12 10:55 PM      Component Value Range Comment   Glucose-Capillary 97  70 - 99 mg/dL   GLUCOSE, CAPILLARY  Status: Abnormal   Collection Time   04/03/12  7:19 AM      Component Value Range Comment   Glucose-Capillary 114 (*) 70 - 99 mg/dL    Comment 1 Documented in Chart      Comment 2 Notify RN       Radiological Exams on Admission: No results found.  EKG with flat T waves in 3 and aVF which was new from prior  Assessment/Plan 1. Atypical chest pain EKG with nonspecific changes Start aspirin 325mg   Cycle cardiac enzymes Also check d-dimer stat, will  proceed with CTAngio of chest to rule out PE if d-dimer abnormal Also start PPI, due to possibility of GERD  2. Lumbar spinal stenosis status post laminectomy and decompression Per Dr. Jeral Fruit  3. Diabetes Mellitus stable on glyburide and linagliptin  4. Anemia: post op: from blood loss and hemodilution  Will hold discharge for today Further management his condition evolves   Thank you for the consult I will follow up   Time Spent on Consultation:  Elfida Shimada [facility]  Pager:[phone] 04/03/2012, 1:32 PM

## 2012-04-04 LAB — BASIC METABOLIC PANEL
CO2: 23 mEq/L (ref 19–32)
Glucose, Bld: 95 mg/dL (ref 70–99)
Potassium: 4.6 mEq/L (ref 3.5–5.1)
Sodium: 139 mEq/L (ref 135–145)

## 2012-04-04 LAB — CARDIAC PANEL(CRET KIN+CKTOT+MB+TROPI)
CK, MB: 2.2 ng/mL (ref 0.3–4.0)
Troponin I: <0.3 ng/mL (ref <0.30)

## 2012-04-04 LAB — CBC
Hemoglobin: 10.1 g/dL — ABNORMAL LOW (ref 13.0–17.0)
RBC: 3.33 MIL/uL — ABNORMAL LOW (ref 4.22–5.81)

## 2012-04-04 MED ORDER — ASPIRIN 81 MG PO CHEW
81.0000 mg | CHEWABLE_TABLET | Freq: Every day | ORAL | Status: DC
  Administered 2012-04-04: 81 mg via ORAL
  Filled 2012-04-04: qty 1

## 2012-04-04 MED ORDER — PANTOPRAZOLE SODIUM 40 MG PO TBEC: 40.0000 mg | DELAYED_RELEASE_TABLET | Freq: Every day | ORAL | Status: DC

## 2012-04-04 NOTE — Discharge Summary (Signed)
  Summary done yesterday but it was put on hold. Seen by hospitalist yesterday and today. Cleared to go to SNF.

## 2012-04-04 NOTE — Progress Notes (Signed)
Subjective: Severe low back pain, not getting relief of that, but no more chest pain or shortness of breath  Objective: Vital signs in last 24 hours: Temp:  [97.6 F (36.4 C)-98.6 F (37 C)] 97.9 F (36.6 C) (07/19 0605) Pulse Rate:  [65-80] 80  (07/19 0605) Resp:  [18] 18  (07/19 0605) BP: (92-110)/(38-74) 107/59 mmHg (07/19 0605) SpO2:  [95 %-99 %] 97 % (07/19 0605) Weight change:  Last BM Date: 04/02/12  Intake/Output from previous day: 07/18 0701 - 07/19 0700 In: -  Out: 800 [Urine:800]     Physical Exam: General: Alert, awake, oriented x3, in no acute distress. HEENT: No bruits, no goiter. Heart: Regular rate and rhythm, without murmurs, rubs, gallops. Lungs: Clear to auscultation bilaterally. Abdomen: Soft, nontender, nondistended, positive bowel sounds. Extremities: No clubbing cyanosis or edema with positive pedal pulses. Low back with dressing Neuro: Grossly intact, nonfocal.    Lab Results: Basic Metabolic Panel:  Basename 04/03/12 1350  NA 137  K 4.8  CL 99  CO2 25  GLUCOSE 115*  BUN 32*  CREATININE 1.12  CALCIUM 9.4  MG --  PHOS --   Liver Function Tests: No results found for this basename: AST:2,ALT:2,ALKPHOS:2,BILITOT:2,PROT:2,ALBUMIN:2 in the last 72 hours No results found for this basename: LIPASE:2,AMYLASE:2 in the last 72 hours No results found for this basename: AMMONIA:2 in the last 72 hours CBC:  Basename 04/04/12 0635 04/03/12 1544  WBC 13.3* 11.7*  NEUTROABS -- --  HGB 10.1* 9.9*  HCT 30.1* 29.5*  MCV 90.4 89.7  PLT 350 305   Cardiac Enzymes:  Basename 04/03/12 2130 04/03/12 1350  CKTOTAL 112 117  CKMB 2.2 2.2  CKMBINDEX -- --  TROPONINI <0.30 <0.30   BNP: No results found for this basename: PROBNP:3 in the last 72 hours D-Dimer:  Basename 04/03/12 1350  DDIMER 2.78*   CBG:  Basename 04/03/12 0719 04/02/12 2255 04/02/12 1645 04/02/12 1139 04/02/12 0645 04/01/12 2133  GLUCAP 114* 97 83 99 101* 73   Hemoglobin  A1C: No results found for this basename: HGBA1C in the last 72 hours Fasting Lipid Panel: No results found for this basename: CHOL,HDL,LDLCALC,TRIG,CHOLHDL,LDLDIRECT in the last 72 hours Thyroid Function Tests: No results found for this basename: TSH,T4TOTAL,FREET4,T3FREE,THYROIDAB in the last 72 hours Anemia Panel: No results found for this basename: VITAMINB12,FOLATE,FERRITIN,TIBC,IRON,RETICCTPCT in the last 72 hours Coagulation: No results found for this basename: LABPROT:2,INR:2 in the last 72 hours Urine Drug Screen: Drugs of Abuse  No results found for this basename: labopia, cocainscrnur, labbenz, amphetmu, thcu, labbarb    Alcohol Level: No results found for this basename: ETH:2 in the last 72 hours Urinalysis: No results found for this basename: COLORURINE:2,APPERANCEUR:2,LABSPEC:2,PHURINE:2,GLUCOSEU:2,HGBUR:2,BILIRUBINUR:2,KETONESUR:2,PROTEINUR:2,UROBILINOGEN:2,NITRITE:2,LEUKOCYTESUR:2 in the last 72 hours  No results found for this or any previous visit (from the past 240 hour(s)).  Studies/Results: Ct Angio Chest W/cm &/or Wo Cm  04/03/2012  *RADIOLOGY REPORT*  Clinical Data: Status post lumbar fusion.  Chest pain.  CT ANGIOGRAPHY CHEST  Technique:  Multidetector CT imaging of the chest using the standard protocol during bolus administration of intravenous contrast. Multiplanar reconstructed images including MIPs were obtained and reviewed to evaluate the vascular anatomy.  Contrast: OMNIPAQUE IOHEXOL 350 MG/ML SOLN  Comparison: Two-view chest x-ray 03/30/2012.  Findings: Pulmonary arterial opacification is satisfactory.  There are no focal filling defects to suggest pulmonary emboli.  The heart size is normal.  Coronary artery calcifications are evident.  There is no significant mediastinal or axillary adenopathy.  Limited imaging of the abdomen  is unremarkable.  The thoracic inlet is within normal limits.  Mild paraseptal emphysematous changes are noted at the apices.  No  focal nodule, mass, or airspace disease is present.  There is no significant pleural or pericardial effusion.  The patient is status post fusion across multiple levels of the cervical spine to include T1.  The bone windows are otherwise unremarkable.  IMPRESSION:  1.  No evidence for pulmonary embolus. 2.  Coronary artery disease. 3.  Postoperative changes of anterior spinal fusion of the lower cervical spine to include T1.  Original Report Authenticated By: Jamesetta Orleans. MATTERN, M.D.    Medications: Scheduled Meds:   . allopurinol  300 mg Oral QHS  . aspirin  325 mg Oral Daily  . atorvastatin  10 mg Oral q1800  . furosemide  40 mg Oral Daily  . gabapentin  300 mg Oral TID  . glimepiride  2 mg Oral BID WC  . heparin subcutaneous  5,000 Units Subcutaneous Q8H  . labetalol  300 mg Oral BID  . linagliptin  5 mg Oral Daily  . pantoprazole  40 mg Oral Q1200  . Tamsulosin HCl  0.4 mg Oral QHS  . traMADol  50 mg Oral Q6H  . DISCONTD: amLODipine  5 mg Oral Daily  . DISCONTD: losartan  100 mg Oral Daily   Continuous Infusions:  PRN Meds:.acetaminophen, acetaminophen, bisacodyl, iohexol, menthol-cetylpyridinium, ondansetron (ZOFRAN) IV, phenol, sodium phosphate  Assessment/Plan: 1. Atypical chest pain-transient resolved, cardiac enzymes negative for ACS/MI and CTA negative for PE Suspect secondary to GERD Change aspirin to 81mg  and add PRotonix 40mg  daily at DC Will also stop Amlodipine and Losartan ( I have made these changes to DC med rec) Advised to follow up with Dr.Gollan his cardiologist in 2 weeks  2. Lumbar spinal stenosis status post laminectomy and decompression  Per Dr. Jeral Fruit  Could consider adding narcotics 3. Diabetes Mellitus stable on glyburide and linagliptin  4. Anemia: post op: from blood loss and hemodilution   OK for DC to SNF from medical standpoint  Zannie Cove, MD [facility] [phone]      LOS: 9 days   [facility] Pager: [phone] 04/04/2012, 7:39 AM

## 2012-04-04 NOTE — Progress Notes (Signed)
Physical Therapy Treatment Patient Details Name: [name] MRN: [id number] DOB: 1946/03/06 Today's Date: 04/04/2012 Time: 1191-4782 PT Time Calculation (min): 17 min  PT Assessment / Plan / Recommendation Comments on Treatment Session  Pt complaining of weakness and 10/10 pain.  Pt was seated in chair, and brace was loose and upside-down.  Reapplied brace and reeducated pt on proper wearing.   Encouraged pt to ambulate to try and reduce pain level and pt agreed.  Pt ambulated out of room. Pt was slow and with antalgic gait.  Pt fatigued and appeared to buckle, so was immediately was seated in following chair.  Pt was able to increase distance today and seemed to have less weakness and knee buckling than in prior sessions.  Pt was more interactive and joked a little while walking.     Follow Up Recommendations  Skilled nursing facility    Barriers to Discharge        Equipment Recommendations  Defer to next venue    Recommendations for Other Services    Frequency Min 5X/week   Plan Discharge plan remains appropriate;Frequency remains appropriate    Precautions / Restrictions Precautions Precautions: Back Precaution Comments: Pt able to state 2/3 precautions.  Reviewed all 3 precautions Required Braces or Orthoses: Spinal Brace Spinal Brace: Lumbar corset;Applied in sitting position Restrictions Weight Bearing Restrictions: No   Pertinent Vitals/Pain 10/10 back pain    Mobility  Bed Mobility Bed Mobility: Not assessed Transfers Sit to Stand: 1: +2 Total assist Sit to Stand: Patient Percentage: 80% Stand to Sit: 1: +2 Total assist Stand to Sit: Patient Percentage: 90% Details for Transfer Assistance: Pt requ +2 assist for safety and VC for hand position, but stronger today.   Ambulation/Gait Ambulation/Gait Assistance: 1: +2 Total assist Ambulation/Gait: Patient Percentage: 90% Ambulation Distance (Feet): 40 Feet Assistive device: Rolling walker Ambulation/Gait  Assistance Details: Pt required A to manage walker and for safety -( pt is unsteady and has tendency to sit without warning) Gait Pattern: Shuffle;Antalgic;Trunk flexed Gait velocity: very slow pace    Exercises     PT Diagnosis:    PT Problem List:   PT Treatment Interventions:     PT Goals Acute Rehab PT Goals PT Goal: Sit to Stand - Progress: Progressing toward goal PT Goal: Stand to Sit - Progress: Progressing toward goal PT Goal: Ambulate - Progress: Progressing toward goal  Visit Information  Last PT Received On: 04/04/12 Assistance Needed: +2 ((+1 to follow with chair))    Subjective Data  Subjective: "I am not doing too good, but I will get up.  Maybe it will help."   Cognition  Overall Cognitive Status: Impaired Area of Impairment: Safety/judgement Arousal/Alertness: Awake/alert Orientation Level: Appears intact for tasks assessed Behavior During Session: [facility] for tasks performed    Balance     End of Session PT - End of Session Equipment Utilized During Treatment: Gait belt;Back brace Activity Tolerance: Patient limited by pain Patient left: in chair;with call bell/phone within reach Nurse Communication: Mobility status   GP     Makaila Windle 04/04/2012, 10:07 AM

## 2012-04-04 NOTE — Progress Notes (Signed)
Patient discharged to skilled nursing facility.  Patient left via EMS transport with personal belongings.  Patient left unit in stable condition.  Osvaldo Angst, RN--------------------

## 2012-04-04 NOTE — Progress Notes (Signed)
Seen and agreed 04/04/2012 Robinette, Julia Elizabeth PTA [phone] pager [phone] office    

## 2012-04-04 NOTE — Clinical Social Work Note (Signed)
Pt is ready for discharge to [facility] Facility has received discharge summary and is ready to accept pt. [facility] will provide transportation. Pt and family are agreeable to discharge plan. CSW is signing off as no further needs identified.  Dede Query, MSW, Theresia Majors [phone]

## 2012-04-15 ENCOUNTER — Other Ambulatory Visit: Payer: Self-pay | Admitting: Physician Assistant

## 2012-04-15 DIAGNOSIS — I6529 Occlusion and stenosis of unspecified carotid artery: Secondary | ICD-10-CM

## 2012-04-15 DIAGNOSIS — E119 Type 2 diabetes mellitus without complications: Secondary | ICD-10-CM

## 2012-04-15 DIAGNOSIS — E785 Hyperlipidemia, unspecified: Secondary | ICD-10-CM

## 2012-04-30 ENCOUNTER — Telehealth (HOSPITAL_COMMUNITY): Payer: Self-pay

## 2012-04-30 NOTE — Telephone Encounter (Signed)
I left a vm in regards to her f/u doppler study apt.

## 2012-05-02 ENCOUNTER — Telehealth (HOSPITAL_COMMUNITY): Payer: Self-pay

## 2012-05-02 NOTE — Telephone Encounter (Signed)
Pt called in to say that he did not want to have the doppler study done.  He stated that his heart doctor has taken over all care//  I asked him 2x was he sure and he stated "yes"

## 2012-05-14 ENCOUNTER — Ambulatory Visit (HOSPITAL_COMMUNITY): Payer: Medicare Other

## 2012-05-21 ENCOUNTER — Other Ambulatory Visit: Payer: Self-pay | Admitting: Cardiovascular Disease

## 2012-05-21 NOTE — Telephone Encounter (Signed)
Refilled Losartan

## 2012-07-17 ENCOUNTER — Encounter (HOSPITAL_COMMUNITY): Payer: Self-pay | Admitting: Pharmacy Technician

## 2012-07-29 ENCOUNTER — Ambulatory Visit (HOSPITAL_COMMUNITY)
Admission: RE | Admit: 2012-07-29 | Discharge: 2012-07-29 | Disposition: A | Discharge status: Home / Self Care | Payer: Medicare Other | Source: Ambulatory Visit | Attending: Ophthalmology | Admitting: Ophthalmology

## 2012-07-29 ENCOUNTER — Encounter (HOSPITAL_COMMUNITY): Payer: Self-pay | Admitting: *Deleted

## 2012-07-29 ENCOUNTER — Encounter (HOSPITAL_COMMUNITY)
Admission: RE | Disposition: A | Discharge status: Home / Self Care | Payer: Self-pay | Source: Ambulatory Visit | Attending: Ophthalmology

## 2012-07-29 DIAGNOSIS — H26499 Other secondary cataract, unspecified eye: Secondary | ICD-10-CM | POA: Insufficient documentation

## 2012-07-29 HISTORY — PX: YAG LASER APPLICATION: SHX6189

## 2012-07-29 SURGERY — TREATMENT, USING YAG LASER
Anesthesia: LOCAL | Laterality: Right

## 2012-07-29 MED ORDER — TROPICAMIDE 1 % OP SOLN
OPHTHALMIC | Status: AC
  Filled 2012-07-29: qty 3

## 2012-07-29 MED ORDER — TROPICAMIDE 1 % OP SOLN
1.0000 [drp] | OPHTHALMIC | Status: AC
  Administered 2012-07-29 (×2): 1 [drp] via OPHTHALMIC

## 2012-07-29 NOTE — Brief Op Note (Signed)
[place] R [place] 07/29/2012  Clyde Zarrella T. Nile Riggs, MD  Yag Laser Self Test Completedyes. Procedure: Posterior Capsulotomy, right eye.  Eye Protection Worn by Staff yes. Laser In Use Sign on Door yes.  Laser: Nd:YAG Spot Size: Fixed Burst Mode: III Power Setting: 3.5 mJ/burst  Number of shots: 9 Total energy delivered: 31.9 mJ  Patency of the peripheral iridotomy was confirmed visually.  The patient tolerated the procedure without difficulty. No complications were encountered.    The patient was discharged home with the instructions to continue all his current glaucoma medications, if any.   Patient instructed to go to office at 0100 for intraocular pressure check.  Patient verbalizes understanding of discharge instructions yes.

## 2012-07-29 NOTE — H&P (Signed)
The patient was re examined and there is no change in the patients condition since the original H and P. 

## 2012-07-31 ENCOUNTER — Encounter (HOSPITAL_COMMUNITY): Payer: Self-pay | Admitting: Ophthalmology

## 2012-08-05 ENCOUNTER — Encounter: Payer: Self-pay | Admitting: Cardiovascular Disease

## 2012-08-05 ENCOUNTER — Encounter (INDEPENDENT_AMBULATORY_CARE_PROVIDER_SITE_OTHER): Payer: Medicare Other

## 2012-08-05 ENCOUNTER — Ambulatory Visit (INDEPENDENT_AMBULATORY_CARE_PROVIDER_SITE_OTHER): Payer: Medicare Other | Admitting: Cardiovascular Disease

## 2012-08-05 VITALS — BP 190/100 | HR 67 | Ht 69.0 in | Wt 213.5 lb

## 2012-08-05 DIAGNOSIS — F17200 Nicotine dependence, unspecified, uncomplicated: Secondary | ICD-10-CM

## 2012-08-05 DIAGNOSIS — I6529 Occlusion and stenosis of unspecified carotid artery: Secondary | ICD-10-CM

## 2012-08-05 DIAGNOSIS — I10 Essential (primary) hypertension: Secondary | ICD-10-CM

## 2012-08-05 DIAGNOSIS — I2510 Atherosclerotic heart disease of native coronary artery without angina pectoris: Secondary | ICD-10-CM | POA: Insufficient documentation

## 2012-08-05 DIAGNOSIS — E785 Hyperlipidemia, unspecified: Secondary | ICD-10-CM

## 2012-08-05 DIAGNOSIS — E119 Type 2 diabetes mellitus without complications: Secondary | ICD-10-CM

## 2012-08-05 MED ORDER — AMLODIPINE BESYLATE 10 MG PO TABS: 10.0000 mg | ORAL_TABLET | Freq: Every day | ORAL | Status: DC

## 2012-08-05 NOTE — Assessment & Plan Note (Signed)
We have stressed to him the importance of restarting his amlodipine. We will increase the dose to 10 mg daily. He reports that he has a blood pressure cuff at home and he will check his blood pressures were consistently. I suspect he will need additional blood pressure medication titration. He will call our office in the next week or so with blood pressure numbers. He will need close followup with Dr. Margo Aye as he does not want to drive the distance to [facility]. He was asking for a one-year followup to we suggested at least 6 months if not sooner. Ideally we would like to see him in several weeks.

## 2012-08-05 NOTE — Assessment & Plan Note (Signed)
We have encouraged continued exercise, careful diet management in an effort to lose weight. 

## 2012-08-05 NOTE — Assessment & Plan Note (Signed)
Coronary artery disease seen on CT scan July 2013. Continue aggressive cholesterol management. No symptoms of chest pain.

## 2012-08-05 NOTE — Patient Instructions (Addendum)
Please start amlodipine one a day for blood pressure Monitor you rblood pressure at home Call the office if your pressure runs more than 160   Please call us if you have new issues that need to be addressed before your next appt.  Your physician wants you to follow-up in: 6 months.  You will receive a reminder letter in the mail two months in advance. If you don't receive a letter, please call our office to schedule the follow-up appointment.

## 2012-08-05 NOTE — Assessment & Plan Note (Signed)
No recent lipid panel available. Goal LDL less than 70 

## 2012-08-05 NOTE — Assessment & Plan Note (Signed)
Repeat ultrasound today showing 70-80% stable right carotid arterial disease, diffuse plaque.

## 2012-08-05 NOTE — Progress Notes (Signed)
Patient ID: [name], male    DOB: 07/29/1946, 66 y.o.   MRN: [id number]  HPI Comments: 66 year old male with a history of obstructive sleep apnea, hypertension , peripheral vascular disease with severe right internal carotid arterial stenosis estimated at 70 to 80%, diabetes, hypertension, hyperlipidemia, obesity, history of cervical spine surgery and prostate cancer with treatment who presents for routine followup. He has a long history of smoking.   Overall he feels well. He is recovering from back surgery and currently is wearing his back brace. CT scan of the chest July 2013 showed underlying coronary artery disease. Repeat carotid ultrasound today showing 70-80% stable right carotid arterial stenosis. He continues to smoke. He reports that he is taking his Crestor. He does not check his blood pressure at home.  He was previously taking amlodipine and he is uncertain why this is not on his list.  Blood pressure rechecked by myself shows systolic pressure in the 190 range over 100.  EKG shows normal sinus rhythm with rate of 67 beats per minute, nonspecific ST abnormality anterior precordial leads   Outpatient Encounter Prescriptions as of 08/05/2012  Medication Sig Dispense Refill  . acetaminophen (TYLENOL) 500 MG tablet Take 500 mg by mouth every 6 (six) hours as needed. For pain      . allopurinol (ZYLOPRIM) 300 MG tablet Take 300 mg by mouth at bedtime.       Marland Kitchen aspirin 81 MG EC tablet Take 81 mg by mouth at bedtime.       . cholecalciferol (VITAMIN D) 1000 UNITS tablet Take 1,000 Units by mouth daily.      . clopidogrel (PLAVIX) 75 MG tablet Take 1 tablet (75 mg total) by mouth daily with breakfast.  30 tablet  6  . colchicine 0.6 MG tablet Take 0.6 mg by mouth daily as needed. For gout      . docusate sodium (COLACE) 100 MG capsule Take 200 mg by mouth at bedtime.      . furosemide (LASIX) 40 MG tablet Take 40 mg by mouth daily. 1 tablet po prn for SOB or swelling      .  gabapentin (NEURONTIN) 300 MG capsule Take 300 mg by mouth 3 (three) times daily.        Marland Kitchen HYDROcodone-acetaminophen (NORCO) 5-325 MG per tablet Take 1 tablet by mouth every 6 (six) hours as needed. For pain      . insulin detemir (LEVEMIR) 100 UNIT/ML injection Inject 8 Units into the skin at bedtime.       Marland Kitchen labetalol (NORMODYNE) 100 MG tablet Take 3 (100 mg) tablets twice a day.      . losartan (COZAAR) 100 MG tablet Take 100 mg by mouth daily.      . niacin (NIASPAN) 1000 MG CR tablet Take 2,000 mg by mouth at bedtime.       Marland Kitchen omega-3 acid ethyl esters (LOVAZA) 1 G capsule Take 1 g by mouth 2 (two) times daily.       . Potassium Gluconate 595 MG CAPS Take 1 capsule by mouth daily.      . rosuvastatin (CRESTOR) 10 MG tablet Take 10 mg by mouth daily.      . Tamsulosin HCl (FLOMAX) 0.4 MG CAPS Take 0.4 mg by mouth at bedtime.        Review of Systems  Constitutional: Negative.   HENT: Negative.   Eyes: Negative.   Respiratory: Negative.   Gastrointestinal: Negative.   Musculoskeletal: Positive for back  pain.  Skin: Negative.   Neurological: Negative.   Hematological: Negative.   Psychiatric/Behavioral: Negative.   All other systems reviewed and are negative.   BP 172/92  Pulse 67  Ht 5\' 9"  (1.753 m)  Wt 213 lb 8 oz (96.843 kg)  BMI 31.53 kg/m2 Repeat blood pressure 190/100  Physical Exam  Nursing note and vitals reviewed. Constitutional: He is oriented to person, place, and time. He appears well-developed and well-nourished.  HENT:  Head: Normocephalic.  Right Ear: External ear normal.  Left Ear: External ear normal.  Nose: Nose normal.  Mouth/Throat: Oropharynx is clear and moist.  Eyes: Conjunctivae normal are normal. Pupils are equal, round, and reactive to light. Right eye exhibits no discharge. No scleral icterus.  Neck: Normal range of motion. Neck supple. No JVD present. Carotid bruit is present.  Cardiovascular: Normal rate, regular rhythm, S1 normal, S2 normal  and intact distal pulses.  Exam reveals no gallop and no friction rub.   Murmur heard.  Crescendo systolic murmur is present with a grade of 2/6  Pulmonary/Chest: Effort normal and breath sounds normal. No respiratory distress. He has no wheezes. He has no rales. He exhibits no tenderness.  Abdominal: Soft. Bowel sounds are normal. He exhibits no distension. There is no tenderness.  Musculoskeletal: Normal range of motion. He exhibits no edema and no tenderness.  Lymphadenopathy:    He has no cervical adenopathy.  Neurological: He is alert and oriented to person, place, and time. Coordination normal.  Skin: Skin is warm and dry. No rash noted. No erythema.  Psychiatric: He has a normal mood and affect. His behavior is normal. Judgment and thought content normal.           Assessment and Plan

## 2012-08-05 NOTE — Assessment & Plan Note (Signed)
We have recommended smoking cessation given his peripheral vascular disease, coronary artery disease. He reports that he has Chantix at home and will use this.

## 2012-10-01 ENCOUNTER — Encounter: Payer: Self-pay | Admitting: Pulmonary Disease

## 2012-10-01 ENCOUNTER — Ambulatory Visit (INDEPENDENT_AMBULATORY_CARE_PROVIDER_SITE_OTHER): Payer: Medicare Other | Admitting: Pulmonary Disease

## 2012-10-01 VITALS — BP 152/86 | HR 75 | Temp 98.4°F | Ht 69.0 in | Wt 225.2 lb

## 2012-10-01 DIAGNOSIS — G4730 Sleep apnea, unspecified: Secondary | ICD-10-CM

## 2012-10-01 NOTE — Patient Instructions (Addendum)
Will have your dme get you new mask, hoses, supplies. Will have your machine and humidifier checked. followup with me in one year if doing well.

## 2012-10-01 NOTE — Progress Notes (Signed)
  Subjective:    Patient ID: [name], male    DOB: 10-03-1945, 67 y.o.   MRN: [id number]  HPI The patient is a 67 year old male who comes in today to reestablish for management of obstructive sleep apnea.  He was last seen in 2009, where we ordered a new bilevel machine for him.  The patient was supposed to return in one year, but comes in today where he is in need of a new mask and supplies.  He has been wearing his bilevel compliantly, and feels that he is sleeping well with adequate daytime alertness.  He is having some mask leaks because of the age of his mask.  His weight is stable since the last visit, and his Epworth Sleepiness Scale today is 4.  Sleep Questionnaire: What time do you typically go to bed?( Between what hours) 11pm How long does it take you to fall asleep? within 2 minutes How many times during the night do you wake up? 1 What time do you get out of bed to start your day? 0730 Do you drive or operate heavy machinery in your occupation? No How much has your weight changed (up or down) over the past two years? (In pounds) 30 lb (13.608 kg) Have you ever had a sleep study before? If yes, location of study? [facility] If yes, date of study? 6 years ago Do you currently use CPAP? Yes If so, what pressure? ?? Do you wear oxygen at any time? No    Review of Systems  Constitutional: Negative for fever and unexpected weight change.  HENT: Negative for ear pain, nosebleeds, congestion, sore throat, rhinorrhea, sneezing, trouble swallowing, dental problem, postnasal drip and sinus pressure.   Eyes: Negative for redness and itching.  Respiratory: Negative for cough, chest tightness, shortness of breath and wheezing.   Cardiovascular: Positive for leg swelling ( treated with Lasix). Negative for palpitations.  Gastrointestinal: Negative for nausea and vomiting.  Genitourinary: Negative for dysuria.  Musculoskeletal: Positive for joint swelling ( arthritis).  Skin: Negative for rash.    Neurological: Negative for headaches.  Hematological: Does not bruise/bleed easily.  Psychiatric/Behavioral: Negative for dysphoric mood. The patient is not nervous/anxious.        Objective:   Physical Exam Constitutional:  Overweight male, no acute distress  HENT:  Nares patent without discharge, deviated septum to left  Oropharynx without exudate, palate and uvula are thickened and elongated.  Eyes:  Perrla, eomi, no scleral icterus  Neck:  No JVD, no TMG  Cardiovascular:  Normal rate, regular rhythm, no rubs or gallops.  2/6 sem        Intact distal pulses  Pulmonary :  Normal breath sounds, no stridor or respiratory distress   No rales, rhonchi, or wheezing  Abdominal:  Soft, nondistended, bowel sounds present.  No tenderness noted.   Musculoskeletal:  1+ lower extremity edema noted.  Lymph Nodes:  No cervical lymphadenopathy noted  Skin:  No cyanosis noted  Neurologic:  Alert, appropriate, moves all 4 extremities without obvious deficit.         Assessment & Plan:

## 2012-10-01 NOTE — Assessment & Plan Note (Signed)
The patient has a history of objective sleep apnea, and has done well in the past on bilevel.  He has not been seen since 2009, and is now in need of a new mask and supplies.  He felt he was doing well with the bilevel prior to his mask becoming an issue.  I will send an order to his medical equipment company for new supplies, but also to check his machine and make sure that it is functioning properly.  I have also encouraged him to work on weight loss, and to keep his followup with me in one year.

## 2012-10-17 ENCOUNTER — Telehealth: Payer: Self-pay | Admitting: *Deleted

## 2012-10-17 NOTE — Telephone Encounter (Signed)
Form is in your [facility] [place] folder to sign, patient needs new Rx for CPAP mask per [facility].   Please advise Dr Shelle Iron. Thanks.

## 2012-10-20 NOTE — Telephone Encounter (Signed)
done

## 2012-10-21 NOTE — Telephone Encounter (Signed)
This has been faxed back to [facility] at [phone] and placed in scan folder.

## 2012-12-06 ENCOUNTER — Other Ambulatory Visit: Payer: Self-pay | Admitting: Cardiovascular Disease

## 2013-07-13 ENCOUNTER — Other Ambulatory Visit: Payer: Self-pay | Admitting: Cardiovascular Disease

## 2013-07-20 ENCOUNTER — Other Ambulatory Visit: Payer: Self-pay

## 2013-07-20 MED ORDER — LOSARTAN POTASSIUM 100 MG PO TABS: 100.0000 mg | ORAL_TABLET | Freq: Every day | ORAL | Status: DC

## 2013-07-20 NOTE — Telephone Encounter (Signed)
Requested Prescriptions   Signed Prescriptions Disp Refills  . losartan (COZAAR) 100 MG tablet 30 tablet 3    Sig: Take 1 tablet (100 mg total) by mouth daily.    Authorizing Provider: Antonieta Iba    Ordering User: Kendrick Fries

## 2013-08-05 ENCOUNTER — Encounter: Payer: Self-pay | Admitting: Cardiovascular Disease

## 2013-08-05 ENCOUNTER — Ambulatory Visit (INDEPENDENT_AMBULATORY_CARE_PROVIDER_SITE_OTHER): Payer: Medicare Other | Admitting: Cardiovascular Disease

## 2013-08-05 VITALS — BP 142/76 | HR 70 | Ht 69.0 in | Wt 225.5 lb

## 2013-08-05 DIAGNOSIS — I6521 Occlusion and stenosis of right carotid artery: Secondary | ICD-10-CM

## 2013-08-05 DIAGNOSIS — I6529 Occlusion and stenosis of unspecified carotid artery: Secondary | ICD-10-CM

## 2013-08-05 DIAGNOSIS — E663 Overweight: Secondary | ICD-10-CM

## 2013-08-05 DIAGNOSIS — Z01818 Encounter for other preprocedural examination: Secondary | ICD-10-CM

## 2013-08-05 DIAGNOSIS — F17200 Nicotine dependence, unspecified, uncomplicated: Secondary | ICD-10-CM

## 2013-08-05 DIAGNOSIS — Z01810 Encounter for preprocedural cardiovascular examination: Secondary | ICD-10-CM | POA: Insufficient documentation

## 2013-08-05 DIAGNOSIS — I2510 Atherosclerotic heart disease of native coronary artery without angina pectoris: Secondary | ICD-10-CM

## 2013-08-05 DIAGNOSIS — I10 Essential (primary) hypertension: Secondary | ICD-10-CM

## 2013-08-05 NOTE — Assessment & Plan Note (Signed)
Recommended smoking cessation. Continue Crestor. Repeat carotid ultrasound at his convenience

## 2013-08-05 NOTE — Assessment & Plan Note (Signed)
We have encouraged continued exercise, careful diet management in an effort to lose weight. 

## 2013-08-05 NOTE — Assessment & Plan Note (Signed)
Currently with no symptoms of angina. No further workup at this time. Continue current medication regimen. 

## 2013-08-05 NOTE — Assessment & Plan Note (Signed)
We have encouraged him to continue to work on weaning his cigarettes and smoking cessation. He will continue to work on this and does not want any assistance with chantix.  

## 2013-08-05 NOTE — Progress Notes (Signed)
Patient ID: [name], male    DOB: 28-Sep-1945, 67 y.o.   MRN: [id number]  HPI Comments: 67 year old male with a history of obstructive sleep apnea, hypertension, peripheral vascular disease with severe right internal carotid arterial stenosis estimated at 60-70%, diabetes, hypertension, hyperlipidemia, obesity, history of cervical spine surgery and prostate cancer with treatment who presents for routine followup. He has a long history of smoking and continues to smoke.  Overall he feels well. He is recovering from back surgery. He reports spending time in a nursing home. CT scan of the chest July 2013 showed underlying coronary artery disease. Previous carotid ultrasound  showing 70-80% stable right carotid arterial stenosis. He continues to smoke. He reports that he is taking his Crestor 10 mg daily .  He does not check his blood pressure at home.  He does not check his blood pressure at home.  He is scheduled for bladder tumor resection surgery in the next several weeks. To be performed in [facility] by Dr. Margarita Grizzle.  EKG shows normal sinus rhythm with rate of 70 beats per minute, nonspecific ST abnormality  in the anterolateral leads and inferior leads   Outpatient Encounter Prescriptions as of 08/05/2013  Medication Sig  . acetaminophen (TYLENOL) 500 MG tablet Take 500 mg by mouth every 6 (six) hours as needed. For pain  . allopurinol (ZYLOPRIM) 300 MG tablet Take 300 mg by mouth at bedtime.   Marland Kitchen aspirin 81 MG EC tablet Take 81 mg by mouth at bedtime.   . cholecalciferol (VITAMIN D) 1000 UNITS tablet Take 1,000 Units by mouth daily.  . clopidogrel (PLAVIX) 75 MG tablet Take 75 mg by mouth daily with breakfast.   . colchicine 0.6 MG tablet Take 0.6 mg by mouth daily as needed. For gout  . docusate sodium (COLACE) 100 MG capsule Take 200 mg by mouth at bedtime.  . furosemide (LASIX) 40 MG tablet Take 40 mg by mouth daily. 1 tablet po prn for SOB or swelling  . gabapentin (NEURONTIN)  300 MG capsule Take 300 mg by mouth 3 (three) times daily.    . insulin detemir (LEVEMIR) 100 UNIT/ML injection Inject 8 Units into the skin at bedtime.   Marland Kitchen labetalol (NORMODYNE) 100 MG tablet Take 3 (100 mg) tablets twice a day.  . losartan (COZAAR) 100 MG tablet Take 1 tablet (100 mg total) by mouth daily.  . meloxicam (MOBIC) 15 MG tablet Take 15 mg by mouth daily.   . niacin (NIASPAN) 1000 MG CR tablet Take 2,000 mg by mouth at bedtime.   Marland Kitchen omega-3 acid ethyl esters (LOVAZA) 1 G capsule Take 1 g by mouth 2 (two) times daily.   . Potassium Gluconate 595 MG CAPS Take 1 capsule by mouth daily.  . rosuvastatin (CRESTOR) 10 MG tablet Take 10 mg by mouth daily.  . Tamsulosin HCl (FLOMAX) 0.4 MG CAPS Take 0.4 mg by mouth at bedtime.  . [DISCONTINUED] amLODipine (NORVASC) 10 MG tablet   . [DISCONTINUED] losartan (COZAAR) 100 MG tablet TAKE 1 TABLET BY MOUTH EVERY DAY     Review of Systems  Constitutional: Negative.   HENT: Negative.   Eyes: Negative.   Respiratory: Negative.   Gastrointestinal: Negative.   Musculoskeletal: Positive for back pain.  Skin: Negative.   Neurological: Negative.   Psychiatric/Behavioral: Negative.   All other systems reviewed and are negative.   BP 142/76  Pulse 70  Ht 5\' 9"  (1.753 m)  Wt 225 lb 8 oz (102.286 kg)  BMI 33.29  kg/m2  Physical Exam  Nursing note and vitals reviewed. Constitutional: He is oriented to person, place, and time. He appears well-developed and well-nourished.  HENT:  Head: Normocephalic.  Right Ear: External ear normal.  Left Ear: External ear normal.  Nose: Nose normal.  Mouth/Throat: Oropharynx is clear and moist.  Eyes: Conjunctivae are normal. Pupils are equal, round, and reactive to light. Right eye exhibits no discharge. No scleral icterus.  Neck: Normal range of motion. Neck supple. No JVD present. Carotid bruit is present.  Cardiovascular: Normal rate, regular rhythm, S1 normal, S2 normal and intact distal pulses.   Exam reveals no gallop and no friction rub.   Murmur heard.  Crescendo systolic murmur is present with a grade of 2/6  Pulmonary/Chest: Effort normal and breath sounds normal. No respiratory distress. He has no wheezes. He has no rales. He exhibits no tenderness.  Abdominal: Soft. Bowel sounds are normal. He exhibits no distension. There is no tenderness.  Musculoskeletal: Normal range of motion. He exhibits no edema and no tenderness.  Lymphadenopathy:    He has no cervical adenopathy.  Neurological: He is alert and oriented to person, place, and time. Coordination normal.  Skin: Skin is warm and dry. No rash noted. No erythema.  Psychiatric: He has a normal mood and affect. His behavior is normal. Judgment and thought content normal.      Assessment and Plan

## 2013-08-05 NOTE — Assessment & Plan Note (Signed)
He would be acceptable risk for upcoming urologic procedures. No recent anginal symptoms with exertion . Okay to hold Plavix for 5 days prior to surgery. Ideally would continue low-dose aspirin 81 mg daily through the procedure given his severe underlying carotid disease.

## 2013-08-05 NOTE — Patient Instructions (Addendum)
You are doing well. No medication changes were made.  Please schedule the carotid ultrasound when possible  Please call us if you have new issues that need to be addressed before your next appt.  Your physician wants you to follow-up in: 6 months.  You will receive a reminder letter in the mail two months in advance. If you don't receive a letter, please call our office to schedule the follow-up appointment.

## 2013-08-06 ENCOUNTER — Other Ambulatory Visit: Payer: Self-pay | Admitting: Urology

## 2013-08-17 ENCOUNTER — Encounter (HOSPITAL_BASED_OUTPATIENT_CLINIC_OR_DEPARTMENT_OTHER): Payer: Self-pay | Admitting: *Deleted

## 2013-08-18 ENCOUNTER — Encounter (HOSPITAL_BASED_OUTPATIENT_CLINIC_OR_DEPARTMENT_OTHER): Payer: Self-pay | Admitting: *Deleted

## 2013-08-18 NOTE — Progress Notes (Addendum)
NPO AFTER MN WITH EXCEPT CLEAR LIQUIDS UNTIL 0900 (NO CREAM/ MILK PRODUCTS). ARRIVE AT 1330.  NEEDS ISTAT. CURRENT EKG IN EPIC AND CHART. WILL TAKE LOSARTAN, LABETALOL, AND GABAPENTIN AM DOS W/ SIPS OF WATER.  PT IS HOH AND WIFE TAKES CARE OF MEDS.

## 2013-08-19 ENCOUNTER — Ambulatory Visit (HOSPITAL_BASED_OUTPATIENT_CLINIC_OR_DEPARTMENT_OTHER)
Admission: RE | Admit: 2013-08-19 | Discharge: 2013-08-19 | Disposition: A | Discharge status: Home / Self Care | Payer: Medicare Other | Source: Ambulatory Visit | Attending: Urology | Admitting: Urology

## 2013-08-19 ENCOUNTER — Encounter (HOSPITAL_BASED_OUTPATIENT_CLINIC_OR_DEPARTMENT_OTHER): Payer: Self-pay | Admitting: *Deleted

## 2013-08-19 ENCOUNTER — Ambulatory Visit (HOSPITAL_BASED_OUTPATIENT_CLINIC_OR_DEPARTMENT_OTHER): Payer: Medicare Other | Admitting: Anesthesiology

## 2013-08-19 ENCOUNTER — Encounter (HOSPITAL_BASED_OUTPATIENT_CLINIC_OR_DEPARTMENT_OTHER)
Admission: RE | Disposition: A | Discharge status: Home / Self Care | Payer: Self-pay | Source: Ambulatory Visit | Attending: Urology

## 2013-08-19 ENCOUNTER — Encounter (HOSPITAL_BASED_OUTPATIENT_CLINIC_OR_DEPARTMENT_OTHER): Payer: Medicare Other | Admitting: Anesthesiology

## 2013-08-19 DIAGNOSIS — G4733 Obstructive sleep apnea (adult) (pediatric): Secondary | ICD-10-CM | POA: Insufficient documentation

## 2013-08-19 DIAGNOSIS — C672 Malignant neoplasm of lateral wall of bladder: Secondary | ICD-10-CM | POA: Insufficient documentation

## 2013-08-19 DIAGNOSIS — Z981 Arthrodesis status: Secondary | ICD-10-CM | POA: Insufficient documentation

## 2013-08-19 DIAGNOSIS — I6529 Occlusion and stenosis of unspecified carotid artery: Secondary | ICD-10-CM | POA: Insufficient documentation

## 2013-08-19 DIAGNOSIS — I2510 Atherosclerotic heart disease of native coronary artery without angina pectoris: Secondary | ICD-10-CM | POA: Insufficient documentation

## 2013-08-19 DIAGNOSIS — I10 Essential (primary) hypertension: Secondary | ICD-10-CM | POA: Insufficient documentation

## 2013-08-19 DIAGNOSIS — C679 Malignant neoplasm of bladder, unspecified: Secondary | ICD-10-CM

## 2013-08-19 DIAGNOSIS — E119 Type 2 diabetes mellitus without complications: Secondary | ICD-10-CM | POA: Insufficient documentation

## 2013-08-19 DIAGNOSIS — E785 Hyperlipidemia, unspecified: Secondary | ICD-10-CM | POA: Insufficient documentation

## 2013-08-19 HISTORY — DX: Personal history of malignant neoplasm of prostate: Z85.46

## 2013-08-19 HISTORY — DX: Occlusion and stenosis of unspecified carotid artery: I65.29

## 2013-08-19 HISTORY — DX: Klippel-Feil syndrome: Q76.1

## 2013-08-19 HISTORY — DX: Other intervertebral disc degeneration, lumbar region without mention of lumbar back pain or lower extremity pain: M51.369

## 2013-08-19 HISTORY — DX: Malignant neoplasm of bladder, unspecified: C67.9

## 2013-08-19 HISTORY — DX: Type 2 diabetes mellitus without complications: E11.9

## 2013-08-19 HISTORY — DX: Other cervical disc degeneration, unspecified cervical region: M50.30

## 2013-08-19 HISTORY — DX: Other intervertebral disc degeneration, lumbar region: M51.36

## 2013-08-19 HISTORY — DX: Unspecified osteoarthritis, unspecified site: M19.90

## 2013-08-19 HISTORY — DX: Spondylosis without myelopathy or radiculopathy, cervical region: M47.812

## 2013-08-19 HISTORY — PX: CYSTOSCOPY W/ RETROGRADES: SHX1426

## 2013-08-19 HISTORY — PX: TRANSURETHRAL RESECTION OF BLADDER TUMOR: SHX2575

## 2013-08-19 LAB — GLUCOSE, CAPILLARY: Glucose-Capillary: 107 mg/dL — ABNORMAL HIGH (ref 70–99)

## 2013-08-19 LAB — POCT I-STAT 4, (NA,K, GLUC, HGB,HCT): Sodium: 144 mEq/L (ref 135–145)

## 2013-08-19 SURGERY — CYSTOSCOPY, WITH RETROGRADE PYELOGRAM
Anesthesia: General | Site: Ureter

## 2013-08-19 MED ORDER — INSULIN ASPART 100 UNIT/ML ~~LOC~~ SOLN
0.0000 [IU] | SUBCUTANEOUS | Status: DC
  Filled 2013-08-19: qty 0.15

## 2013-08-19 MED ORDER — FENTANYL CITRATE 0.05 MG/ML IJ SOLN
INTRAMUSCULAR | Status: DC | PRN
  Administered 2013-08-19: 50 ug via INTRAVENOUS

## 2013-08-19 MED ORDER — STERILE WATER FOR IRRIGATION IR SOLN
Status: DC | PRN
  Administered 2013-08-19: 3000 mL

## 2013-08-19 MED ORDER — PROPOFOL 10 MG/ML IV BOLUS
INTRAVENOUS | Status: DC | PRN
  Administered 2013-08-19: 200 mg via INTRAVENOUS

## 2013-08-19 MED ORDER — BELLADONNA ALKALOIDS-OPIUM 16.2-60 MG RE SUPP
RECTAL | Status: AC
  Filled 2013-08-19: qty 1

## 2013-08-19 MED ORDER — ATROPINE SULFATE 0.4 MG/ML IJ SOLN
INTRAMUSCULAR | Status: DC | PRN
  Administered 2013-08-19: .5 mg via INTRAVENOUS

## 2013-08-19 MED ORDER — BELLADONNA ALKALOIDS-OPIUM 16.2-60 MG RE SUPP
RECTAL | Status: DC | PRN
  Administered 2013-08-19: 1 via RECTAL

## 2013-08-19 MED ORDER — LACTATED RINGERS IV SOLN
INTRAVENOUS | Status: DC
  Administered 2013-08-19: 14:00:00 via INTRAVENOUS
  Filled 2013-08-19: qty 1000

## 2013-08-19 MED ORDER — EPHEDRINE SULFATE 50 MG/ML IJ SOLN
INTRAMUSCULAR | Status: DC | PRN
  Administered 2013-08-19 (×3): 10 mg via INTRAVENOUS

## 2013-08-19 MED ORDER — MIDAZOLAM HCL 2 MG/2ML IJ SOLN
INTRAMUSCULAR | Status: AC
  Filled 2013-08-19: qty 2

## 2013-08-19 MED ORDER — FENTANYL CITRATE 0.05 MG/ML IJ SOLN
INTRAMUSCULAR | Status: AC
  Filled 2013-08-19: qty 2

## 2013-08-19 MED ORDER — HYDROCODONE-ACETAMINOPHEN 5-325 MG PO TABS: 1.0000 | ORAL_TABLET | ORAL | Status: DC | PRN

## 2013-08-19 MED ORDER — CEFAZOLIN SODIUM-DEXTROSE 2-3 GM-% IV SOLR
2.0000 g | INTRAVENOUS | Status: DC
  Filled 2013-08-19: qty 50

## 2013-08-19 MED ORDER — IOHEXOL 350 MG/ML SOLN
INTRAVENOUS | Status: DC | PRN
  Administered 2013-08-19: 8 mL via URETHRAL

## 2013-08-19 MED ORDER — LIDOCAINE HCL 2 % EX GEL
CUTANEOUS | Status: DC | PRN
  Administered 2013-08-19: 1 via URETHRAL

## 2013-08-19 MED ORDER — LIDOCAINE HCL (CARDIAC) 20 MG/ML IV SOLN
INTRAVENOUS | Status: DC | PRN
  Administered 2013-08-19: 80 mg via INTRAVENOUS

## 2013-08-19 MED ORDER — CEPHALEXIN 500 MG PO CAPS: 500.0000 mg | ORAL_CAPSULE | Freq: Three times a day (TID) | ORAL | Status: DC

## 2013-08-19 MED ORDER — PHENAZOPYRIDINE HCL 100 MG PO TABS: 100.0000 mg | ORAL_TABLET | Freq: Three times a day (TID) | ORAL | Status: DC | PRN

## 2013-08-19 MED ORDER — PHENYLEPHRINE HCL 10 MG/ML IJ SOLN
INTRAMUSCULAR | Status: DC | PRN
  Administered 2013-08-19: 100 ug via INTRAVENOUS

## 2013-08-19 SURGICAL SUPPLY — 53 items
BAG DRAIN URO-CYSTO SKYTR STRL (DRAIN) ×3 IMPLANT
BAG URINE DRAINAGE (UROLOGICAL SUPPLIES) IMPLANT
BAG URINE LEG 19OZ MD ST LTX (BAG) IMPLANT
BASKET LASER NITINOL 1.9FR (BASKET) IMPLANT
BASKET STNLS GEMINI 4WIRE 3FR (BASKET) IMPLANT
BASKET ZERO TIP NITINOL 2.4FR (BASKET) IMPLANT
BRUSH URET BIOPSY 3F (UROLOGICAL SUPPLIES) IMPLANT
CANISTER SUCT LVC 12 LTR MEDI- (MISCELLANEOUS) ×3 IMPLANT
CATH FOLEY 2WAY SLVR  5CC 20FR (CATHETERS)
CATH FOLEY 2WAY SLVR  5CC 22FR (CATHETERS)
CATH FOLEY 2WAY SLVR  5CC 24FR (CATHETERS) ×1
CATH FOLEY 2WAY SLVR 5CC 20FR (CATHETERS) IMPLANT
CATH FOLEY 2WAY SLVR 5CC 22FR (CATHETERS) IMPLANT
CATH FOLEY 2WAY SLVR 5CC 24FR (CATHETERS) ×2 IMPLANT
CATH HEMA 3WAY 30CC 24FR COUDE (CATHETERS) ×3 IMPLANT
CATH HEMA 3WAY 30CC 24FR RND (CATHETERS) IMPLANT
CATH INTERMIT  6FR 70CM (CATHETERS) IMPLANT
CATH URET 5FR 28IN CONE TIP (BALLOONS)
CATH URET 5FR 28IN OPEN ENDED (CATHETERS) IMPLANT
CATH URET 5FR 70CM CONE TIP (BALLOONS) IMPLANT
CLOTH BEACON ORANGE TIMEOUT ST (SAFETY) ×3 IMPLANT
DRAPE CAMERA CLOSED 9X96 (DRAPES) ×3 IMPLANT
ELECT BUTTON BIOP 24F 90D PLAS (MISCELLANEOUS) IMPLANT
ELECT BUTTON HF 24-28F 2 30DE (ELECTRODE) IMPLANT
ELECT LOOP MED HF 24F 12D (CUTTING LOOP) ×3 IMPLANT
ELECT REM PT RETURN 9FT ADLT (ELECTROSURGICAL) ×3
ELECTRODE REM PT RTRN 9FT ADLT (ELECTROSURGICAL) ×2 IMPLANT
EVACUATOR MICROVAS BLADDER (UROLOGICAL SUPPLIES) IMPLANT
FIBER LASER FLEXIVA 200 (UROLOGICAL SUPPLIES) IMPLANT
FIBER LASER FLEXIVA 365 (UROLOGICAL SUPPLIES) IMPLANT
GLOVE BIO SURGEON STRL SZ7 (GLOVE) ×3 IMPLANT
GLOVE BIOGEL M 6.5 STRL (GLOVE) ×3 IMPLANT
GLOVE BIOGEL M STER SZ 6 (GLOVE) ×6 IMPLANT
GLOVE INDICATOR 7.5 STRL GRN (GLOVE) IMPLANT
GOWN PREVENTION PLUS LG XLONG (DISPOSABLE) ×3 IMPLANT
GOWN STRL REIN XL XLG (GOWN DISPOSABLE) ×3 IMPLANT
GUIDEWIRE 0.038 PTFE COATED (WIRE) IMPLANT
GUIDEWIRE ANG ZIPWIRE 038X150 (WIRE) IMPLANT
GUIDEWIRE STR DUAL SENSOR (WIRE) ×3 IMPLANT
HOLDER FOLEY CATH W/STRAP (MISCELLANEOUS) IMPLANT
IV NS IRRIG 3000ML ARTHROMATIC (IV SOLUTION) ×3 IMPLANT
KIT ASPIRATION TUBING (SET/KITS/TRAYS/PACK) IMPLANT
KIT BALLIN UROMAX 15FX10 (LABEL) IMPLANT
KIT BALLN UROMAX 15FX4 (MISCELLANEOUS) IMPLANT
KIT BALLN UROMAX 26 75X4 (MISCELLANEOUS)
PACK CYSTOSCOPY (CUSTOM PROCEDURE TRAY) ×3 IMPLANT
PLUG CATH AND CAP STER (CATHETERS) IMPLANT
SET ASPIRATION TUBING (TUBING) IMPLANT
SET HIGH PRES BAL DIL (LABEL)
SHEATH URET ACCESS 12FR/35CM (UROLOGICAL SUPPLIES) IMPLANT
SHEATH URET ACCESS 12FR/55CM (UROLOGICAL SUPPLIES) IMPLANT
SYRINGE IRR TOOMEY STRL 70CC (SYRINGE) IMPLANT
WATER STERILE IRR 3000ML UROMA (IV SOLUTION) ×3 IMPLANT

## 2013-08-19 NOTE — Anesthesia Postprocedure Evaluation (Signed)
  Anesthesia Post-op Note  Patient: [name]  Procedure(s) Performed: Procedure(s) (LRB): CYSTOSCOPY WITH RETROGRADE PYELOGRAM (Bilateral) TRANSURETHRAL RESECTION OF BLADDER TUMOR (TURBT) (N/A)  Patient Location: PACU  Anesthesia Type: General  Level of Consciousness: awake and alert   Airway and Oxygen Therapy: Patient Spontanous Breathing  Post-op Pain: mild  Post-op Assessment: Post-op Vital signs reviewed, Patient's Cardiovascular Status Stable, Respiratory Function Stable, Patent Airway and No signs of Nausea or vomiting  Last Vitals:  Filed Vitals:   08/19/13 1742  BP: 170/80  Pulse: 86  Temp: 36.1 C  Resp: 18    Post-op Vital Signs: stable   Complications: No apparent anesthesia complications

## 2013-08-19 NOTE — H&P (Signed)
Urology History and Physical Exam  CC: Bladder cancer.  HPI:  67 year old male presents today for bladder cancer.  This was initially diagnosed in 2004.  It was urothelial carcinoma which was low-grade.  Recent surveillance cystoscopy on 07/25/13 revealed new bladder tumors.  These were papillary in nature.  One was located on the right lateral bladder wall and the other was located on the left lateral bladder wall.  There were approximately 1 cm in size.  Nothing makes it better or worse.  Denies history of gross hematuria.  He presents today for cystoscopy, transurethral resection of bladder tumor, bladder biopsy, and bilateral retrograde pyelograms.  His cardiologist, Dr. Julien Nordmann cleared him to hold Plavix for 5 days as that he continue his 81 mg aspirin.  UA 08/04/13 was negative for signs of infection.  PMH: Past Medical History  Diagnosis Date  . Hyperlipidemia   . Type 2 diabetes mellitus   . History of prostate cancer   . Hypertension   . Coronary artery disease     CARDIOLOGIST-- DR Mariah Milling Lorelle Formosa)  . OA (osteoarthritis)   . Cervical fusion syndrome     LEFT ARM NUMBNESS--  CONTROLLED W/ GABAPENTIN  . Bladder cancer     hx turbt in 2004  . OSA on CPAP     BiPAP  . DDD (degenerative disc disease), cervical   . Spondylosis, cervical   . DDD (degenerative disc disease), lumbar   . Occlusion and stenosis of carotid artery without mention of cerebral infarction CARDIOLOGIST  -- DR Mariah Milling    DUPLEX  08-05-2012  RICA  60-80%/   LICA 0-39%  . Nocturia   . Urgency of urination     PSH: Past Surgical History  Procedure Laterality Date  . Cervical fusion  05-11-2005    C3  --  C7  . Septoplasty  1998  . Colonoscopy  04/2011  . Yag laser application  07/29/2012    Procedure: YAG LASER APPLICATION;  Surgeon: Loraine Leriche T. Nile Riggs, MD;  Location: [facility];  Service: Ophthalmology;  Laterality: Right;  . Radioactive prostate seed implants  08-19-2003  . Lumbar fusion   03-26-2012    L3 --  L5  . Cataract extraction w/ intraocular lens  implant, bilateral  2013  . Appendectomy  1990'S  . Anterior removal cage and plate Y7-W2/ corpectomy c7 (fx)/  allograft and fusion c3 -- t1/  posterior decompression bilateral laminectomy c4 -- 6 & partial c3/  posterolateral arthrodesis c3 - t1  06-05-2005  . Intraoperative arteriogram  CATH LAB  01-05-2009  DR Jacinto Halim    RICA   ACUTE ANGLE 80-85% STENOSIS  . Endoscopic repair csf leak via nasal passage  2011    Allergies: Allergies  Allergen Reactions  . Pregabalin Nausea Only and Other (See Comments)    Make me feel "bad"    Medications: No prescriptions prior to admission     Social History: History   Social History  . Marital Status: Married    Spouse Name: N/A    Number of Children: N/A  . Years of Education: N/A   Occupational History  . retired//disabled    Social History Main Topics  . Smoking status: Current Every Day Smoker -- 1.00 packs/day for 40 years    Types: Cigarettes  . Smokeless tobacco: Never Used     Comment: pt quits smoking periodically,   states has stopped smoking 08-06-2013 (approx) this time  . Alcohol Use: No  . Drug Use: No  .  Sexual Activity: Not on file   Other Topics Concern  . Not on file   Social History Narrative  . No narrative on file    Family History: Family History  Problem Relation Age of Onset  . Cancer Other     Family hx  . Diabetes Other     family hx  . Arthritis Other     family hx  . Cancer Mother   . Heart disease Father     MI  . Colon cancer Brother     Review of Systems: Positive: None. Negative: Fever, SOB, or chest pain.  A further 10 point review of systems was negative except what is listed in the HPI.  Physical Exam: Filed Vitals:   08/19/13 1340  BP: 182/82  Pulse: 77  Temp: 97.4 F (36.3 C)  Resp: 18    General: No acute distress.  Awake. Head:  Normocephalic.  Atraumatic. ENT:  EOMI.  Mucous membranes  moist Neck:  Supple.  No lymphadenopathy. CV:  S1 present. S2 present.  Pulmonary: Equal effort bilaterally.  Clear to auscultation bilaterally. Abdomen: Soft.  Non- tender to palpation. Skin:  Normal turgor.  No visible rash. Extremity: No gross deformity of bilateral upper extremities.  No gross deformity of    bilateral lower extremities. Neurologic: Alert. Appropriate mood.    Studies:  No results found for this basename: HGB, WBC, PLT,  in the last 72 hours  No results found for this basename: NA, K, CL, CO2, BUN, CREATININE, CALCIUM, MAGNESIUM, GFRNONAA, GFRAA,  in the last 72 hours   No results found for this basename: PT, INR, APTT,  in the last 72 hours   No components found with this basename: ABG,     Assessment:  Bladder cancer.  Plan: To OR for cystoscopy, transurethral resection of bladder tumor, bladder biopsy, and bilateral retrograde pyelograms.

## 2013-08-19 NOTE — Anesthesia Procedure Notes (Signed)
Procedure Name: LMA Insertion Date/Time: 08/19/2013 3:19 PM Performed by: Maris Berger T Pre-anesthesia Checklist: Patient identified, Emergency Drugs available, Suction available and Patient being monitored Patient Re-evaluated:Patient Re-evaluated prior to inductionOxygen Delivery Method: Circle System Utilized Preoxygenation: Pre-oxygenation with 100% oxygen Intubation Type: IV induction Ventilation: Mask ventilation without difficulty LMA: LMA inserted LMA Size: 5.0 Number of attempts: 1 Airway Equipment and Method: bite block Placement Confirmation: positive ETCO2 Dental Injury: Teeth and Oropharynx as per pre-operative assessment

## 2013-08-19 NOTE — Transfer of Care (Addendum)
Immediate Anesthesia Transfer of Care Note  Patient: [name]  Procedure(s) Performed: Procedure(s): CYSTOSCOPY WITH RETROGRADE PYELOGRAM (Bilateral) TRANSURETHRAL RESECTION OF BLADDER TUMOR (TURBT) (N/A)  Patient Location: PACU  Anesthesia Type:General  Level of Consciousness: awake, alert  and oriented  Airway & Oxygen Therapy: Patient Spontanous Breathing and Patient connected to nasal cannula oxygen  Post-op Assessment: Report given to PACU RN and Post -op Vital signs reviewed and stable, moving all extremities.  Equal hand grip.  Post vital signs: Reviewed and stable  Complications: No apparent anesthesia complications

## 2013-08-19 NOTE — Anesthesia Preprocedure Evaluation (Addendum)
Anesthesia Evaluation  Patient identified by MRN, date of birth, ID band Patient awake    Reviewed: Allergy & Precautions, H&P , NPO status , Patient's Chart, lab work & pertinent test results  Airway Mallampati: III TM Distance: >3 FB Neck ROM: full    Dental  (+) Missing and Dental Advisory Given Many side teeth missing:   Pulmonary sleep apnea and Continuous Positive Airway Pressure Ventilation , Current Smoker,  breath sounds clear to auscultation  Pulmonary exam normal       Cardiovascular Exercise Tolerance: Good hypertension, Pt. on medications + CAD Rhythm:regular Rate:Normal  Cardiac stable   Neuro/Psych Carotid stenosis.  Cervical fusion negative neurological ROS  negative psych ROS   GI/Hepatic negative GI ROS, Neg liver ROS,   Endo/Other  diabetes, Well Controlled, Type 2, Insulin Dependent  Renal/GU negative Renal ROS  negative genitourinary   Musculoskeletal   Abdominal   Peds  Hematology negative hematology ROS (+)   Anesthesia Other Findings   Reproductive/Obstetrics negative OB ROS                          Anesthesia Physical Anesthesia Plan  ASA: III  Anesthesia Plan: General   Post-op Pain Management:    Induction: Intravenous  Airway Management Planned: LMA  Additional Equipment:   Intra-op Plan:   Post-operative Plan:   Informed Consent: I have reviewed the patients History and Physical, chart, labs and discussed the procedure including the risks, benefits and alternatives for the proposed anesthesia with the patient or authorized representative who has indicated his/her understanding and acceptance.   Dental Advisory Given  Plan Discussed with: CRNA and Surgeon  Anesthesia Plan Comments:         Anesthesia Quick Evaluation

## 2013-08-19 NOTE — Op Note (Signed)
Urology Operative Report  Date of Procedure: 08/19/13  Surgeon: Natalia Leatherwood, MD Assistant:  None  Preoperative Diagnosis: Bladder cancer. Postoperative Diagnosis:  Same  Procedure(s): Bladder biopsy Cystoscopy Bilateral retrograde pyelograms with interpretation  Estimated blood loss: Minimal  Specimen: Bladder biopsy sent for pathology.  Drains: None  Complications: None  Findings: Two small papillary tumors. Negative filling defects or hydronephrosis of the ureters and renal collecting system bilaterally.  History of present illness: 67 year old male with a history of low-grade urothelial carcinoma was discovered to have new tumors in his bladder on surveillance cystoscopy. He presents today for removal of these tumors and bilateral retrograde pyelogram.   Procedure in detail: After informed consent was obtained, the patient was taken to the operating room. They were placed in the supine position. SCDs were turned on and in place. IV antibiotics were infused, and general anesthesia was induced. A timeout was performed in which the correct patient, surgical site, and procedure were identified and agreed upon by the team.  The patient was placed in a dorsolithotomy position, making sure to pad all pertinent neurovascular pressure points. A belladonna and opium suppository was placed into the rectum. The genitals were prepped and draped in the usual sterile fashion.  A rigid cystoscope was passed through the urethra and into the bladder. The bladder was drained and then fully distended and evaluated in a systematic fashion with a 12 and 70 lens in order to visualize the entire surface of the bladder. There was noted to be a small papillary tumor on the right lateral bladder wall in the left lateral bladder wall. There were some other areas of erythema which were not papillary tumors throughout the bladder. Cold cup biopsy forceps were used to remove each of the tumors. Bugbee  electrode was used to cauterize the side of biopsy and the area around the biopsy. I also cauterized the areas of erythema in the bladder.  I cannulated the right and left ureter orifice with a 5 [place] ureter catheter and injected contrast to obtain a retrograde pyeloram. This showed no filling defects or hydronephrosis bilaterally.  I drained the bladder, removed the cystoscope, and placed in cc of lidocaine jelly into the urethra.  He was placed back in a supine position, anesthesia was reversed, and he was taken to the PACU in stable condition.  All counts were correct at the end of the case.

## 2013-08-20 ENCOUNTER — Encounter (HOSPITAL_BASED_OUTPATIENT_CLINIC_OR_DEPARTMENT_OTHER): Payer: Self-pay | Admitting: Urology

## 2013-08-24 ENCOUNTER — Telehealth: Payer: Self-pay

## 2013-08-24 NOTE — Telephone Encounter (Signed)
Pt needs cardiac clearance for tooth surgery. Please call.

## 2013-08-24 NOTE — Telephone Encounter (Signed)
Awaiting signature from Dr. Mariah Milling.

## 2013-08-27 ENCOUNTER — Other Ambulatory Visit: Payer: Self-pay | Admitting: Cardiovascular Disease

## 2013-08-27 ENCOUNTER — Other Ambulatory Visit: Payer: Self-pay | Admitting: *Deleted

## 2013-08-27 MED ORDER — AMLODIPINE BESYLATE 10 MG PO TABS: 10.0000 mg | ORAL_TABLET | Freq: Every day | ORAL | Status: DC

## 2013-08-27 NOTE — Telephone Encounter (Signed)
Requested Prescriptions   Signed Prescriptions Disp Refills  . amLODipine (NORVASC) 10 MG tablet 90 tablet 3    Sig: Take 1 tablet (10 mg total) by mouth daily.    Authorizing Provider: GOLLAN, TIMOTHY J    Ordering User: Melynda Krzywicki C    

## 2013-08-31 NOTE — Telephone Encounter (Signed)
Faxed letter of cardiac clearance to [facility] that pt is acceptable risk for surgery.

## 2013-10-02 ENCOUNTER — Ambulatory Visit (INDEPENDENT_AMBULATORY_CARE_PROVIDER_SITE_OTHER): Payer: Medicare Other | Admitting: Pulmonary Disease

## 2013-10-02 ENCOUNTER — Encounter: Payer: Self-pay | Admitting: Pulmonary Disease

## 2013-10-02 VITALS — BP 144/72 | HR 69 | Temp 97.8°F | Ht 69.0 in | Wt 231.6 lb

## 2013-10-02 DIAGNOSIS — G4733 Obstructive sleep apnea (adult) (pediatric): Secondary | ICD-10-CM

## 2013-10-02 NOTE — Patient Instructions (Signed)
Will send an order to your home care company to get new supplies. Work on weight loss followup with me in one year

## 2013-10-02 NOTE — Assessment & Plan Note (Signed)
The patient is doing well from a sleep apnea standpoint, and is due for new supplies at this time. Prior to having issues with his recent surgery, he felt that he was sleeping well with adequate daytime alertness. I've encouraged him to work aggressively on weight loss, and to keep up with his supplies on a more regular basis. Will see him back in one year if doing well.

## 2013-10-02 NOTE — Progress Notes (Signed)
   Subjective:    Patient ID: [name], male    DOB: Apr 18, 1946, 68 y.o.   MRN: [id number]  HPI Patient comes in today for followup of his obstructive sleep apnea. He is wearing CPAP compliantly, and is having no issues with his mask fit or pressure. He has had recent surgery, and complications have cost him to not sleep as well or feel as rested as before. Prior to this, he was doing very well with CPAP. He is overdue for a new mask and supplies.   Review of Systems  Constitutional: Negative for fever and unexpected weight change.  HENT: Positive for congestion, postnasal drip and rhinorrhea. Negative for dental problem, ear pain, nosebleeds, sinus pressure, sneezing, sore throat and trouble swallowing.   Eyes: Negative for redness and itching.  Respiratory: Negative for cough, chest tightness, shortness of breath and wheezing.   Cardiovascular: Negative for palpitations and leg swelling.  Gastrointestinal: Negative for nausea and vomiting.  Genitourinary: Negative for dysuria.  Musculoskeletal: Negative for joint swelling.  Skin: Negative for rash.  Neurological: Negative for headaches.  Hematological: Does not bruise/bleed easily.  Psychiatric/Behavioral: Negative for dysphoric mood. The patient is not nervous/anxious.        Objective:   Physical Exam Overweight male in no acute distress Nose without purulence or discharge noted No skin breakdown or pressure necrosis from the CPAP mask Neck without lymphadenopathy or thyromegaly Lower extremities with mild edema, no cyanosis Alert and oriented, does not appear to be sleepy, moves all 4 extremities.       Assessment & Plan:

## 2013-12-03 ENCOUNTER — Other Ambulatory Visit: Payer: Self-pay | Admitting: Cardiovascular Disease

## 2014-01-04 ENCOUNTER — Other Ambulatory Visit: Payer: Self-pay | Admitting: Cardiovascular Disease

## 2014-02-04 ENCOUNTER — Inpatient Hospital Stay (HOSPITAL_COMMUNITY)
Admission: EM | Admit: 2014-02-04 | Discharge: 2014-02-07 | DRG: 872 | Disposition: A | Discharge status: Home / Self Care | Payer: Medicare Other | Attending: Internal Medicine | Admitting: Internal Medicine

## 2014-02-04 ENCOUNTER — Encounter (HOSPITAL_COMMUNITY): Payer: Self-pay | Admitting: Emergency Medicine

## 2014-02-04 ENCOUNTER — Emergency Department (HOSPITAL_COMMUNITY): Payer: Medicare Other

## 2014-02-04 DIAGNOSIS — A4150 Gram-negative sepsis, unspecified: Secondary | ICD-10-CM

## 2014-02-04 DIAGNOSIS — M545 Low back pain, unspecified: Secondary | ICD-10-CM

## 2014-02-04 DIAGNOSIS — A419 Sepsis, unspecified organism: Secondary | ICD-10-CM | POA: Diagnosis present

## 2014-02-04 DIAGNOSIS — I10 Essential (primary) hypertension: Secondary | ICD-10-CM | POA: Diagnosis present

## 2014-02-04 DIAGNOSIS — M1A00X0 Idiopathic chronic gout, unspecified site, without tophus (tophi): Secondary | ICD-10-CM | POA: Diagnosis present

## 2014-02-04 DIAGNOSIS — E119 Type 2 diabetes mellitus without complications: Secondary | ICD-10-CM | POA: Diagnosis present

## 2014-02-04 DIAGNOSIS — F17200 Nicotine dependence, unspecified, uncomplicated: Secondary | ICD-10-CM | POA: Diagnosis present

## 2014-02-04 DIAGNOSIS — Z8546 Personal history of malignant neoplasm of prostate: Secondary | ICD-10-CM

## 2014-02-04 DIAGNOSIS — N390 Urinary tract infection, site not specified: Secondary | ICD-10-CM | POA: Diagnosis present

## 2014-02-04 DIAGNOSIS — Z800 Family history of malignant neoplasm of digestive organs: Secondary | ICD-10-CM

## 2014-02-04 DIAGNOSIS — A4151 Sepsis due to Escherichia coli [E. coli]: Principal | ICD-10-CM | POA: Diagnosis present

## 2014-02-04 DIAGNOSIS — M109 Gout, unspecified: Secondary | ICD-10-CM | POA: Diagnosis present

## 2014-02-04 DIAGNOSIS — Z720 Tobacco use: Secondary | ICD-10-CM | POA: Diagnosis present

## 2014-02-04 DIAGNOSIS — C61 Malignant neoplasm of prostate: Secondary | ICD-10-CM | POA: Diagnosis present

## 2014-02-04 DIAGNOSIS — G4733 Obstructive sleep apnea (adult) (pediatric): Secondary | ICD-10-CM | POA: Diagnosis present

## 2014-02-04 DIAGNOSIS — M48061 Spinal stenosis, lumbar region without neurogenic claudication: Secondary | ICD-10-CM | POA: Diagnosis present

## 2014-02-04 DIAGNOSIS — E785 Hyperlipidemia, unspecified: Secondary | ICD-10-CM | POA: Diagnosis present

## 2014-02-04 DIAGNOSIS — I2510 Atherosclerotic heart disease of native coronary artery without angina pectoris: Secondary | ICD-10-CM | POA: Diagnosis present

## 2014-02-04 DIAGNOSIS — G8929 Other chronic pain: Secondary | ICD-10-CM | POA: Diagnosis present

## 2014-02-04 DIAGNOSIS — Z833 Family history of diabetes mellitus: Secondary | ICD-10-CM

## 2014-02-04 DIAGNOSIS — Z8249 Family history of ischemic heart disease and other diseases of the circulatory system: Secondary | ICD-10-CM

## 2014-02-04 DIAGNOSIS — M1990 Unspecified osteoarthritis, unspecified site: Secondary | ICD-10-CM | POA: Diagnosis present

## 2014-02-04 DIAGNOSIS — C679 Malignant neoplasm of bladder, unspecified: Secondary | ICD-10-CM | POA: Diagnosis present

## 2014-02-04 LAB — URINE MICROSCOPIC-ADD ON

## 2014-02-04 LAB — URINALYSIS, ROUTINE W REFLEX MICROSCOPIC
Bilirubin Urine: NEGATIVE
Glucose, UA: NEGATIVE mg/dL
Ketones, ur: NEGATIVE mg/dL
NITRITE: POSITIVE — AB
Protein, ur: NEGATIVE mg/dL
Specific Gravity, Urine: 1.015 (ref 1.005–1.030)
UROBILINOGEN UA: 0.2 mg/dL (ref 0.0–1.0)
pH: 5.5 (ref 5.0–8.0)

## 2014-02-04 LAB — CBC WITH DIFFERENTIAL/PLATELET
BASOS ABS: 0 10*3/uL (ref 0.0–0.1)
Basophils Relative: 0 % (ref 0–1)
EOS PCT: 3 % (ref 0–5)
Eosinophils Absolute: 0.2 10*3/uL (ref 0.0–0.7)
HCT: 39.3 % (ref 39.0–52.0)
HEMOGLOBIN: 13.4 g/dL (ref 13.0–17.0)
Lymphocytes Relative: 6 % — ABNORMAL LOW (ref 12–46)
Lymphs Abs: 0.5 10*3/uL — ABNORMAL LOW (ref 0.7–4.0)
MCH: 30.7 pg (ref 26.0–34.0)
MCHC: 34.1 g/dL (ref 30.0–36.0)
MCV: 89.9 fL (ref 78.0–100.0)
MONOS PCT: 2 % — AB (ref 3–12)
Monocytes Absolute: 0.2 10*3/uL (ref 0.1–1.0)
Neutro Abs: 7.3 10*3/uL (ref 1.7–7.7)
Neutrophils Relative %: 89 % — ABNORMAL HIGH (ref 43–77)
Platelets: 156 10*3/uL (ref 150–400)
RBC: 4.37 MIL/uL (ref 4.22–5.81)
RDW: 13.3 % (ref 11.5–15.5)
WBC: 8.3 10*3/uL (ref 4.0–10.5)

## 2014-02-04 LAB — COMPREHENSIVE METABOLIC PANEL
ALT: 26 U/L (ref 0–53)
AST: 26 U/L (ref 0–37)
Albumin: 4 g/dL (ref 3.5–5.2)
Alkaline Phosphatase: 79 U/L (ref 39–117)
BUN: 15 mg/dL (ref 6–23)
CALCIUM: 9.3 mg/dL (ref 8.4–10.5)
CO2: 28 mEq/L (ref 19–32)
CREATININE: 0.97 mg/dL (ref 0.50–1.35)
Chloride: 100 mEq/L (ref 96–112)
GFR calc Af Amer: >90 mL/min (ref >90)
GFR, EST NON AFRICAN AMERICAN: 82 mL/min — AB (ref >90)
Glucose, Bld: 128 mg/dL — ABNORMAL HIGH (ref 70–99)
Potassium: 3.8 mEq/L (ref 3.7–5.3)
Sodium: 142 mEq/L (ref 137–147)
Total Bilirubin: 0.6 mg/dL (ref 0.3–1.2)
Total Protein: 7.3 g/dL (ref 6.0–8.3)

## 2014-02-04 LAB — LACTIC ACID, PLASMA: LACTIC ACID, VENOUS: 1.6 mmol/L (ref 0.5–2.2)

## 2014-02-04 MED ORDER — INSULIN ASPART 100 UNIT/ML ~~LOC~~ SOLN
0.0000 [IU] | Freq: Three times a day (TID) | SUBCUTANEOUS | Status: DC
  Administered 2014-02-05 – 2014-02-06 (×3): 2 [IU] via SUBCUTANEOUS
  Administered 2014-02-06: 3 [IU] via SUBCUTANEOUS
  Administered 2014-02-07 (×2): 2 [IU] via SUBCUTANEOUS

## 2014-02-04 MED ORDER — ACETAMINOPHEN 325 MG PO TABS
650.0000 mg | ORAL_TABLET | Freq: Four times a day (QID) | ORAL | Status: DC | PRN
  Administered 2014-02-05: 650 mg via ORAL
  Filled 2014-02-04: qty 2

## 2014-02-04 MED ORDER — SODIUM CHLORIDE 0.9 % IV BOLUS (SEPSIS)
500.0000 mL | Freq: Once | INTRAVENOUS | Status: AC
  Administered 2014-02-04: 500 mL via INTRAVENOUS

## 2014-02-04 MED ORDER — DEXTROSE 5 % IV SOLN
1.0000 g | INTRAVENOUS | Status: DC
  Administered 2014-02-05 – 2014-02-06 (×2): 1 g via INTRAVENOUS
  Filled 2014-02-04 (×3): qty 10

## 2014-02-04 MED ORDER — LORAZEPAM 2 MG/ML IJ SOLN
1.0000 mg | Freq: Once | INTRAMUSCULAR | Status: AC
  Administered 2014-02-04: 1 mg via INTRAVENOUS
  Filled 2014-02-04: qty 1

## 2014-02-04 MED ORDER — ALBUTEROL SULFATE (2.5 MG/3ML) 0.083% IN NEBU: 2.5000 mg | INHALATION_SOLUTION | RESPIRATORY_TRACT | Status: DC | PRN

## 2014-02-04 MED ORDER — DEXTROSE 5 % IV SOLN
1.0000 g | Freq: Once | INTRAVENOUS | Status: AC
  Administered 2014-02-04: 1 g via INTRAVENOUS
  Filled 2014-02-04: qty 10

## 2014-02-04 MED ORDER — ONDANSETRON HCL 4 MG PO TABS: 4.0000 mg | ORAL_TABLET | Freq: Four times a day (QID) | ORAL | Status: DC | PRN

## 2014-02-04 MED ORDER — LOSARTAN POTASSIUM 50 MG PO TABS
100.0000 mg | ORAL_TABLET | Freq: Every morning | ORAL | Status: DC
  Administered 2014-02-05 – 2014-02-07 (×3): 100 mg via ORAL
  Filled 2014-02-04 (×3): qty 2

## 2014-02-04 MED ORDER — ASPIRIN EC 81 MG PO TBEC
81.0000 mg | DELAYED_RELEASE_TABLET | Freq: Every day | ORAL | Status: DC
  Administered 2014-02-05 – 2014-02-06 (×3): 81 mg via ORAL
  Filled 2014-02-04 (×3): qty 1

## 2014-02-04 MED ORDER — TAMSULOSIN HCL 0.4 MG PO CAPS
0.4000 mg | ORAL_CAPSULE | Freq: Every day | ORAL | Status: DC
  Administered 2014-02-05 – 2014-02-06 (×3): 0.4 mg via ORAL
  Filled 2014-02-04 (×3): qty 1

## 2014-02-04 MED ORDER — CLOPIDOGREL BISULFATE 75 MG PO TABS
75.0000 mg | ORAL_TABLET | Freq: Every day | ORAL | Status: DC
  Administered 2014-02-05 – 2014-02-07 (×3): 75 mg via ORAL
  Filled 2014-02-04 (×3): qty 1

## 2014-02-04 MED ORDER — INSULIN DETEMIR 100 UNIT/ML ~~LOC~~ SOLN
6.0000 [IU] | Freq: Every day | SUBCUTANEOUS | Status: DC
  Administered 2014-02-05 – 2014-02-06 (×3): 6 [IU] via SUBCUTANEOUS
  Filled 2014-02-04 (×4): qty 0.06

## 2014-02-04 MED ORDER — LABETALOL HCL 200 MG PO TABS
300.0000 mg | ORAL_TABLET | Freq: Two times a day (BID) | ORAL | Status: DC
  Administered 2014-02-05 – 2014-02-07 (×5): 300 mg via ORAL
  Filled 2014-02-04 (×5): qty 2

## 2014-02-04 MED ORDER — ACETAMINOPHEN 650 MG RE SUPP: 650.0000 mg | Freq: Four times a day (QID) | RECTAL | Status: DC | PRN

## 2014-02-04 MED ORDER — ALLOPURINOL 300 MG PO TABS
300.0000 mg | ORAL_TABLET | Freq: Every day | ORAL | Status: DC
  Administered 2014-02-05 – 2014-02-06 (×3): 300 mg via ORAL
  Filled 2014-02-04 (×3): qty 1

## 2014-02-04 MED ORDER — ACETAMINOPHEN 500 MG PO TABS
1000.0000 mg | ORAL_TABLET | Freq: Once | ORAL | Status: AC
  Administered 2014-02-04: 1000 mg via ORAL
  Filled 2014-02-04: qty 2

## 2014-02-04 MED ORDER — GABAPENTIN 300 MG PO CAPS
300.0000 mg | ORAL_CAPSULE | Freq: Three times a day (TID) | ORAL | Status: DC
  Administered 2014-02-05 – 2014-02-07 (×8): 300 mg via ORAL
  Filled 2014-02-04: qty 3
  Filled 2014-02-04 (×7): qty 1
  Filled 2014-02-04: qty 3

## 2014-02-04 MED ORDER — FINASTERIDE 5 MG PO TABS
5.0000 mg | ORAL_TABLET | Freq: Every day | ORAL | Status: DC
  Administered 2014-02-05 – 2014-02-06 (×3): 5 mg via ORAL
  Filled 2014-02-04 (×4): qty 1

## 2014-02-04 MED ORDER — ATORVASTATIN CALCIUM 20 MG PO TABS
20.0000 mg | ORAL_TABLET | Freq: Every day | ORAL | Status: DC
  Administered 2014-02-05 – 2014-02-06 (×2): 20 mg via ORAL
  Filled 2014-02-04 (×2): qty 1

## 2014-02-04 MED ORDER — ONDANSETRON HCL 4 MG/2ML IJ SOLN: 4.0000 mg | Freq: Four times a day (QID) | INTRAMUSCULAR | Status: DC | PRN

## 2014-02-04 MED ORDER — ENOXAPARIN SODIUM 40 MG/0.4ML ~~LOC~~ SOLN
40.0000 mg | SUBCUTANEOUS | Status: DC
  Administered 2014-02-05 – 2014-02-07 (×3): 40 mg via SUBCUTANEOUS
  Filled 2014-02-04 (×3): qty 0.4

## 2014-02-04 MED ORDER — SODIUM CHLORIDE 0.9 % IV SOLN
INTRAVENOUS | Status: AC
  Administered 2014-02-05: 01:00:00 via INTRAVENOUS

## 2014-02-04 MED ORDER — HYDROCODONE-ACETAMINOPHEN 5-325 MG PO TABS
1.0000 | ORAL_TABLET | ORAL | Status: DC | PRN
  Administered 2014-02-05: 2 via ORAL
  Administered 2014-02-05 – 2014-02-06 (×3): 1 via ORAL
  Administered 2014-02-07 (×2): 2 via ORAL
  Filled 2014-02-04 (×2): qty 1
  Filled 2014-02-04 (×2): qty 2
  Filled 2014-02-04: qty 1
  Filled 2014-02-04: qty 2

## 2014-02-04 MED ORDER — NICOTINE 14 MG/24HR TD PT24
14.0000 mg | MEDICATED_PATCH | Freq: Every day | TRANSDERMAL | Status: DC
Start: 2014-02-05 — End: 2014-02-07
  Administered 2014-02-05 – 2014-02-07 (×3): 14 mg via TRANSDERMAL
  Filled 2014-02-04 (×3): qty 1

## 2014-02-04 MED ORDER — DOCUSATE SODIUM 100 MG PO CAPS
200.0000 mg | ORAL_CAPSULE | Freq: Every day | ORAL | Status: DC
  Administered 2014-02-05 – 2014-02-06 (×3): 200 mg via ORAL
  Filled 2014-02-04 (×3): qty 2

## 2014-02-04 NOTE — ED Notes (Signed)
Family reports pt felt normal yesterday but today started having involuntary jerking in his extremities and fever.  Pt took 800mg  ibuprofen around 1445.  Pt alert, pleasant.  Denies pain but still having jerking movements.

## 2014-02-04 NOTE — ED Notes (Signed)
Pt resting, tremoring has stopped.

## 2014-02-04 NOTE — ED Notes (Signed)
Fever, face flushed, jerking motions.   Hx of sepsis  Given motrin at 245p

## 2014-02-04 NOTE — ED Provider Notes (Signed)
CSN: [id number]     Arrival date & time 02/04/14  1509 History  This chart was scribed for Maudry Diego, MD by Ludger Nutting, ED Scribe. This patient was seen in room [facility] and the patient's care was started 3:29 PM.    Chief Complaint  Patient presents with  . Fever      Patient is a 68 y.o. male presenting with fever. The history is provided by the patient and a relative. No language interpreter was used.  Fever Temp source:  Unable to specify Onset quality:  Gradual Timing:  Constant Progression:  Unchanged Chronicity:  New Relieved by:  Ibuprofen Associated symptoms: chills   Associated symptoms: no chest pain, no congestion, no cough, no diarrhea, no headaches, no rash and no sore throat     HPI Comments: [name] is a 68 y.o. male who presents to the Emergency Department complaining of constant fever and chills that began earlier today. Patient also has associated uncontrollable, generalized shaking and back pain. Family member states his blood pressure was in the 062'B systolic. He was given motrin about 1 hour ago. She reports patient had similar symptoms last year when he had sepsis after not receiving antibiotics after a pulled tooth. He denies cough, sore throat.   Past Medical History  Diagnosis Date  . Hyperlipidemia   . Type 2 diabetes mellitus   . History of prostate cancer   . Hypertension   . Coronary artery disease     CARDIOLOGIST-- DR Rockey Situ [name])  . OA (osteoarthritis)   . Cervical fusion syndrome     LEFT ARM NUMBNESS--  CONTROLLED W/ GABAPENTIN  . OSA on CPAP     BiPAP  . DDD (degenerative disc disease), cervical   . Spondylosis, cervical   . DDD (degenerative disc disease), lumbar   . Occlusion and stenosis of carotid artery without mention of cerebral infarction CARDIOLOGIST  -- DR Rockey Situ    DUPLEX  08-05-2012  RICA  76-28%/   LICA 3-15%  . Nocturia   . Urgency of urination   . Bladder cancer     hx turbt in 2004  .  Meningitis    Past Surgical History  Procedure Laterality Date  . Cervical fusion  05-11-2005    C3  --  C7  . Septoplasty  1998  . Colonoscopy  04/2011  . Yag laser application  17/61/6073    Procedure: YAG LASER APPLICATION;  Surgeon: Elta Guadeloupe T. Gershon Crane, MD;  Location: [facility];  Service: Ophthalmology;  Laterality: Right;  . Radioactive prostate seed implants  08-19-2003  . Lumbar fusion  03-26-2012    L3 --  L5  . Cataract extraction w/ intraocular lens  implant, bilateral  2013  . Appendectomy  1990'S  . Anterior removal cage and plate c3-c7/ corpectomy c7 (fx)/  allograft and fusion c3 -- t1/  posterior decompression bilateral laminectomy c4 -- 6 & partial c3/  posterolateral arthrodesis c3 - t1  06-05-2005  . Intraoperative arteriogram  CATH LAB  01-05-2009  DR Einar Gip    RICA   ACUTE ANGLE 80-85% STENOSIS  . Endoscopic repair csf leak via nasal passage  2011  . Cystoscopy w/ retrogrades Bilateral 08/19/2013    Procedure: CYSTOSCOPY WITH RETROGRADE PYELOGRAM;  Surgeon: Molli Hazard, MD;  Location: [facility];  Service: Urology;  Laterality: Bilateral;  . Transurethral resection of bladder tumor N/A 08/19/2013    Procedure: TRANSURETHRAL RESECTION OF BLADDER TUMOR (TURBT);  Surgeon: Dennard Schaumann  Jasmine December, MD;  Location: [facility];  Service: Urology;  Laterality: N/A;   Family History  Problem Relation Age of Onset  . Cancer Other     Family hx  . Diabetes Other     family hx  . Arthritis Other     family hx  . Cancer Mother   . Heart disease Father     MI  . Colon cancer Brother    History  Substance Use Topics  . Smoking status: Current Every Day Smoker -- 1.00 packs/day for 40 years    Types: Cigarettes  . Smokeless tobacco: Never Used     Comment: pt quits smoking periodically,   states has stopped smoking 08-06-2013 (approx) this time  . Alcohol Use: No    Review of Systems  Constitutional: Positive for fever and chills.  Negative for appetite change and fatigue.  HENT: Negative for congestion, ear discharge, sinus pressure and sore throat.   Eyes: Negative for discharge.  Respiratory: Negative for cough.   Cardiovascular: Negative for chest pain.  Gastrointestinal: Negative for abdominal pain and diarrhea.  Genitourinary: Negative for frequency and hematuria.  Musculoskeletal: Negative for back pain.  Skin: Negative for rash.  Neurological: Negative for seizures and headaches.  Psychiatric/Behavioral: Negative for hallucinations.      Allergies  Pregabalin  Home Medications   Prior to Admission medications   Medication Sig Start Date End Date Taking? Authorizing Provider  allopurinol (ZYLOPRIM) 300 MG tablet Take 300 mg by mouth at bedtime.     Historical Provider, MD  amLODipine (NORVASC) 10 MG tablet Take 1 tablet (10 mg total) by mouth daily. 08/27/13   Minna Merritts, MD  aspirin 81 MG EC tablet Take 81 mg by mouth at bedtime.     Historical Provider, MD  cholecalciferol (VITAMIN D) 1000 UNITS tablet Take 1,000 Units by mouth daily.    Historical Provider, MD  ciprofloxacin (CIPRO) 500 MG tablet Take 500 mg by mouth 2 (two) times daily.    Historical Provider, MD  clopidogrel (PLAVIX) 75 MG tablet Take 75 mg by mouth daily with breakfast.  07/31/13   Historical Provider, MD  colchicine 0.6 MG tablet Take 0.6 mg by mouth daily as needed. For gout    Historical Provider, MD  docusate sodium (COLACE) 100 MG capsule Take 200 mg by mouth at bedtime.    Historical Provider, MD  furosemide (LASIX) 40 MG tablet Take 40 mg by mouth daily. 1 tablet po prn for SOB or swelling    Historical Provider, MD  gabapentin (NEURONTIN) 300 MG capsule Take 300 mg by mouth 3 (three) times daily.      Historical Provider, MD  HYDROcodone-acetaminophen (NORCO/VICODIN) 5-325 MG per tablet Take 1-2 tablets by mouth every 4 (four) hours as needed for moderate pain. 08/19/13   Sharyn Creamer, MD  insulin detemir  (LEVEMIR) 100 UNIT/ML injection Inject 12 Units into the skin at bedtime.     Historical Provider, MD  labetalol (NORMODYNE) 100 MG tablet Take 3 (100 mg) tablets twice a day.    Historical Provider, MD  losartan (COZAAR) 100 MG tablet Take 100 mg by mouth every morning. 07/20/13   Minna Merritts, MD  losartan (COZAAR) 100 MG tablet TAKE 1 TABLET BY MOUTH EVERY DAY 01/04/14   Minna Merritts, MD  meloxicam (MOBIC) 15 MG tablet Take 15 mg by mouth daily.  07/22/13   Historical Provider, MD  niacin (NIASPAN) 1000 MG CR tablet Take 2,000 mg  by mouth at bedtime.     Historical Provider, MD  omega-3 acid ethyl esters (LOVAZA) 1 G capsule Take 1 g by mouth 2 (two) times daily.     Historical Provider, MD  phenazopyridine (PYRIDIUM) 100 MG tablet Take 1 tablet (100 mg total) by mouth every 8 (eight) hours as needed for pain (Burning urination.  Will turn urine and body fluids orange.). 08/19/13   Sharyn Creamer, MD  Potassium Gluconate 595 MG CAPS Take 1 capsule by mouth daily.    Historical Provider, MD  rosuvastatin (CRESTOR) 10 MG tablet Take 10 mg by mouth every morning.     Historical Provider, MD  Tamsulosin HCl (FLOMAX) 0.4 MG CAPS Take 0.4 mg by mouth at bedtime.    Historical Provider, MD   BP 139/67  Pulse 100  Temp(Src) 99.8 F (37.7 C) (Oral)  Resp 20  Ht 5\' 9"  (1.753 m)  Wt 231 lb (104.781 kg)  BMI 34.10 kg/m2  SpO2 95% Physical Exam  Nursing note and vitals reviewed. Constitutional: He is oriented to person, place, and time. He appears well-developed.  Shaking all over  HENT:  Head: Normocephalic.  Eyes: Conjunctivae and EOM are normal. No scleral icterus.  Neck: Neck supple. No thyromegaly present.  Cardiovascular: Normal rate and regular rhythm.  Exam reveals no gallop and no friction rub.   No murmur heard. Pulmonary/Chest: No stridor. He has no wheezes. He has no rales. He exhibits no tenderness.  Abdominal: He exhibits no distension. There is no tenderness. There is no  rebound.  Musculoskeletal: Normal range of motion. He exhibits no edema.  Lymphadenopathy:    He has no cervical adenopathy.  Neurological: He is oriented to person, place, and time. He exhibits normal muscle tone. Coordination normal.  Skin: No rash noted. No erythema.  Psychiatric: He has a normal mood and affect. His behavior is normal.    ED Course  Procedures (including critical care time)  DIAGNOSTIC STUDIES: Oxygen Saturation is 95% on RA, adequate by my interpretation.    COORDINATION OF CARE: 3:28 PM Discussed treatment plan with pt at bedside and pt agreed to plan.   Labs Review Labs Reviewed - No data to display  Imaging Review No results found.   EKG Interpretation None      MDM   Final diagnoses:  None   The chart was scribed for me under my direct supervision.  I personally performed the history, physical, and medical decision making and all procedures in the evaluation of this patient.Maudry Diego, MD 02/04/14 2017

## 2014-02-04 NOTE — H&P (Signed)
[facility] History and Physical  [name]:[id number] DOB: [birth year] DOA: 02/04/2014   PCP: Ricky Cahill, MD  Specialists: Patient is followed by Dr. Jasmine [place] with Urology for history of bladder and prostate cancer. Cardiologist is Dr. Rockey [place] in [place] who mainly follows carotid artery disease. He's also followed by specialists at [facility] for history of CSF leak, and is followed by Dr. Gwenette [place] for sleep apnea  Chief Complaint: Jerking movements and feeling hot and cold  HPI: [name] is a 68 y.o. male with a past medical history that is quite extensive and includes gout, chronic back pain due to degenerative disc disease, obstructive sleep apnea, diabetes on insulin, hypertension, carotid artery disease, bladder, and prostate cancers, who underwent bladder surgery back in [place]. He had 2 UTIs following this, but none in April. He was in his usual state of health until early afternoon today, when he started having jerking movements. According to his daughter, who is the ED nurse he was shaking all over, but was awake, and was talking. It was not a seizure type activity. He had pain in his back. He has noticed some slow stream with urination recently with frequent urination. Some discomfort as well, but he is unable to specify. He has felt hot and cold. When his daughter evaluated him he was hot, so she gave him ibuprofen. However, temperature was not checked at home. There was no nausea, vomiting. No syncopal episodes. He had been constipated recently and had taken mag citrate and had a bowel movement in the ED. He was brought into the emergency department and his temperature rectally was 102.8. He was given Tylenol, IV fluids, and he's feeling slightly improved.  Home Medications: Prior to Admission medications   Medication Sig Start Date End Date Taking? Authorizing Provider  allopurinol (ZYLOPRIM) 300 MG tablet Take 300 mg by mouth at bedtime.    Yes Historical  Provider, MD  aspirin 81 MG EC tablet Take 81 mg by mouth at bedtime.    Yes Historical Provider, MD  celecoxib (CELEBREX) 200 MG capsule Take 200 mg by mouth every morning.   Yes Historical Provider, MD  cholecalciferol (VITAMIN D) 1000 UNITS tablet Take 1,000 Units by mouth daily.   Yes Historical Provider, MD  clopidogrel (PLAVIX) 75 MG tablet Take 75 mg by mouth daily with breakfast.  07/31/13  Yes Historical Provider, MD  colchicine 0.6 MG tablet Take 0.6 mg by mouth daily as needed. For gout   Yes Historical Provider, MD  docusate sodium (COLACE) 100 MG capsule Take 200 mg by mouth at bedtime.   Yes Historical Provider, MD  finasteride (PROSCAR) 5 MG tablet Take 5 mg by mouth at bedtime.   Yes Historical Provider, MD  furosemide (LASIX) 40 MG tablet Take 40 mg by mouth every morning. 1 tablet po prn for SOB or swelling   Yes Historical Provider, MD  gabapentin (NEURONTIN) 300 MG capsule Take 300 mg by mouth 3 (three) times daily.     Yes Historical Provider, MD  HYDROcodone-acetaminophen (NORCO/VICODIN) 5-325 MG per tablet Take 1-2 tablets by mouth every 4 (four) hours as needed for moderate pain. 08/19/13  Yes Ricky Creamer, MD  ibuprofen (ADVIL,MOTRIN) 200 MG tablet Take 800 mg by mouth daily as needed for moderate pain.   Yes Historical Provider, MD  insulin detemir (LEVEMIR) 100 UNIT/ML injection Inject 14 Units into the skin at bedtime.    Yes Historical Provider, MD  labetalol (NORMODYNE) 100 MG tablet Take  300 mg by mouth 2 (two) times daily. Take 3 (100 mg) tablets twice a day.   Yes Historical Provider, MD  losartan (COZAAR) 100 MG tablet Take 100 mg by mouth every morning. 07/20/13  Yes Ricky Merritts, MD  niacin (NIASPAN) 1000 MG CR tablet Take 2,000 mg by mouth at bedtime.    Yes Historical Provider, MD  omega-3 acid ethyl esters (LOVAZA) 1 G capsule Take 1 g by mouth 2 (two) times daily.    Yes Historical Provider, MD  Potassium Gluconate 595 MG CAPS Take 1 capsule by mouth  daily.   Yes Historical Provider, MD  rosuvastatin (CRESTOR) 10 MG tablet Take 10 mg by mouth every morning.    Yes Historical Provider, MD  Tamsulosin HCl (FLOMAX) 0.4 MG CAPS Take 0.4 mg by mouth at bedtime.   Yes Historical Provider, MD    Allergies:  Allergies  Allergen Reactions  . Toradol [Ketorolac Tromethamine] Other (See Comments)    Causes hallucinations  . Pregabalin Nausea Only and Other (See Comments)    Make me feel "bad"    Past Medical History: Past Medical History  Diagnosis Date  . Hyperlipidemia   . Type 2 diabetes mellitus   . History of prostate cancer   . Hypertension   . Coronary artery disease     CARDIOLOGIST-- DR [name] [name])  . OA (osteoarthritis)   . Cervical fusion syndrome     LEFT ARM NUMBNESS--  CONTROLLED W/ GABAPENTIN  . OSA on CPAP     BiPAP  . DDD (degenerative disc disease), cervical   . Spondylosis, cervical   . DDD (degenerative disc disease), lumbar   . Occlusion and stenosis of carotid artery without mention of cerebral infarction CARDIOLOGIST  -- DR [name]    DUPLEX  08-05-2012  RICA  90-24%/   LICA 0-97%  . Nocturia   . Urgency of urination   . Bladder cancer     hx turbt in 2004  . Meningitis     Past Surgical History  Procedure Laterality Date  . Cervical fusion  05-11-2005    C3  --  C7  . Septoplasty  1998  . Colonoscopy  04/2011  . Yag laser application  35/32/9924    Procedure: YAG LASER APPLICATION;  Surgeon: Ricky Guadeloupe T. Gershon Crane, MD;  Location: [facility];  Service: Ophthalmology;  Laterality: Right;  . Radioactive prostate seed implants  08-19-2003  . Lumbar fusion  03-26-2012    L3 --  L5  . Cataract extraction w/ intraocular lens  implant, bilateral  2013  . Appendectomy  1990'S  . Anterior removal cage and plate c3-c7/ corpectomy c7 (fx)/  allograft and fusion c3 -- t1/  posterior decompression bilateral laminectomy c4 -- 6 & partial c3/  posterolateral arthrodesis c3 - t1  06-05-2005  . Intraoperative  arteriogram  CATH LAB  01-05-2009  DR [name]    RICA   ACUTE ANGLE 80-85% STENOSIS  . Endoscopic repair csf leak via nasal passage  2011  . Cystoscopy w/ retrogrades Bilateral 08/19/2013    Procedure: CYSTOSCOPY WITH RETROGRADE PYELOGRAM;  Surgeon: Molli Hazard, MD;  Location[facility];  Service: Urology;  Laterality: Bilateral;  . Transurethral resection of bladder tumor N/A 08/19/2013    Procedure: TRANSURETHRAL RESECTION OF BLADDER TUMOR (TURBT);  Surgeon: Molli Hazard, MD;  Location: [facility];  Service: Urology;  Laterality: N/A;    Social History: Lives with his wife in [place], [place]. Smokes  one pack of cigarettes on a daily basis. No alcohol use. No illicit drug use. Usually independent with daily activities.  Family History:  Family History  Problem Relation Age of Onset  . Cancer Other     Family hx  . Diabetes Other     family hx  . Arthritis Other     family hx  . Cancer Mother   . Heart disease Father     MI  . Colon cancer Brother      Review of Systems - History obtained from the patient General ROS: positive for  - chills, fatigue and fever Psychological ROS: negative Ophthalmic ROS: negative ENT ROS: negative Allergy and Immunology ROS: negative Hematological and Lymphatic ROS: negative Endocrine ROS: negative Respiratory ROS: no cough, shortness of breath, or wheezing Cardiovascular ROS: no chest pain or dyspnea on exertion Gastrointestinal ROS: as in hpi Genito-Urinary ROS: as in hpi Musculoskeletal ROS: positive for - lower back pain Neurological ROS: no TIA or stroke symptoms Dermatological ROS: negative  Physical Examination  Filed Vitals:   02/04/14 1519 02/04/14 1655 02/04/14 1728  BP: 139/67 127/68 122/76  Pulse: 100 96 95  Temp: 99.8 F (37.7 C) 102.8 F (39.3 C) 100.8 F (38.2 C)  TempSrc: Oral Rectal Rectal  Resp: 20  24  Height: _0  (1.753 m)    Weight: 104.781 kg (231 lb)     SpO2: 95% 97% 95%    BP 122/76  Pulse 95  Temp(Src) 100.8 F (38.2 C) (Rectal)  Resp 24  Ht _1  (1.753 m)  Wt 104.781 kg (231 lb)  BMI 34.10 kg/m2  SpO2 95%  General appearance: alert, cooperative, appears stated age and no distress Head: Normocephalic, without obvious abnormality, atraumatic Eyes: conjunctivae/corneas clear. PERRL, EOM's intact.  Throat: slightly dry mm Neck: no adenopathy, no carotid bruit, no JVD, supple, symmetrical, trachea midline and thyroid not enlarged, symmetric, no tenderness/mass/nodules Resp: clear to auscultation bilaterally Cardio: regular rate and rhythm, S1, S2 normal, no murmur, click, rub or gallop GI: Abdomen is soft. Mild tenderness in the right upper quadrant with equivocal Murphy's sign. No rebound, rigidity, or guarding. No masses, or organomegaly. Bowel sounds are present. Extremities: Minimal edema in bilateral lower extremities. Pulses: 2+ and symmetric Skin: Skin color, texture, turgor normal. No rashes or lesions Lymph nodes: Cervical, supraclavicular, and axillary nodes normal. Neurologic: He is alert and oriented x3. Cranial nerves are intact. Motor strength appears to be equal, bilateral upper and lower extremities.  Laboratory Data: Results for orders placed during the hospital encounter of 02/04/14 (from the past 48 hour(s))  LACTIC ACID, PLASMA     Status: None   Collection Time    02/04/14  4:01 PM      Result Value Ref Range   Lactic Acid, Venous 1.6  0.5 - 2.2 mmol/L  CBC WITH DIFFERENTIAL     Status: Abnormal   Collection Time    02/04/14  4:40 PM      Result Value Ref Range   WBC 8.3  4.0 - 10.5 K/uL   RBC 4.37  4.22 - 5.81 MIL/uL   Hemoglobin 13.4  13.0 - 17.0 g/dL   HCT 39.3  39.0 - 52.0 %   MCV 89.9  78.0 - 100.0 fL   MCH 30.7  26.0 - 34.0 pg   MCHC 34.1  30.0 - 36.0 g/dL   RDW 13.3  11.5 - 15.5 %   Platelets 156  150 - 400 K/uL   Neutrophils Relative %  89 (*) 43 - 77 %   Neutro Abs 7.3  1.7 - 7.7 K/uL    Lymphocytes Relative 6 (*) 12 - 46 %   Lymphs Abs 0.5 (*) 0.7 - 4.0 K/uL   Monocytes Relative 2 (*) 3 - 12 %   Monocytes Absolute 0.2  0.1 - 1.0 K/uL   Eosinophils Relative 3  0 - 5 %   Eosinophils Absolute 0.2  0.0 - 0.7 K/uL   Basophils Relative 0  0 - 1 %   Basophils Absolute 0.0  0.0 - 0.1 K/uL  COMPREHENSIVE METABOLIC PANEL     Status: Abnormal   Collection Time    02/04/14  4:40 PM      Result Value Ref Range   Sodium 142  137 - 147 mEq/L   Potassium 3.8  3.7 - 5.3 mEq/L   Chloride 100  96 - 112 mEq/L   CO2 28  19 - 32 mEq/L   Glucose, Bld 128 (*) 70 - 99 mg/dL   BUN 15  6 - 23 mg/dL   Creatinine, Ser 0.97  0.50 - 1.35 mg/dL   Calcium 9.3  8.4 - 10.5 mg/dL   Total Protein 7.3  6.0 - 8.3 g/dL   Albumin 4.0  3.5 - 5.2 g/dL   AST 26  0 - 37 U/L   ALT 26  0 - 53 U/L   Alkaline Phosphatase 79  39 - 117 U/L   Total Bilirubin 0.6  0.3 - 1.2 mg/dL   GFR calc non Af Amer 82 (*) >90 mL/min   GFR calc Af Amer >90  >90 mL/min   Comment: (NOTE)     The eGFR has been calculated using the CKD EPI equation.     This calculation has not been validated in all clinical situations.     eGFR's persistently <90 mL/min signify possible Chronic Kidney     Disease.  URINALYSIS, ROUTINE W REFLEX MICROSCOPIC     Status: Abnormal   Collection Time    02/04/14  5:31 PM      Result Value Ref Range   Color, Urine YELLOW  YELLOW   APPearance HAZY (*) CLEAR   Specific Gravity, Urine 1.015  1.005 - 1.030   pH 5.5  5.0 - 8.0   Glucose, UA NEGATIVE  NEGATIVE mg/dL   Hgb urine dipstick MODERATE (*) NEGATIVE   Bilirubin Urine NEGATIVE  NEGATIVE   Ketones, ur NEGATIVE  NEGATIVE mg/dL   Protein, ur NEGATIVE  NEGATIVE mg/dL   Urobilinogen, UA 0.2  0.0 - 1.0 mg/dL   Nitrite POSITIVE (*) NEGATIVE   Leukocytes, UA MODERATE (*) NEGATIVE  URINE MICROSCOPIC-ADD ON     Status: Abnormal   Collection Time    02/04/14  5:31 PM      Result Value Ref Range   Squamous Epithelial / LPF RARE  RARE   WBC, UA TOO  NUMEROUS TO COUNT  <3 WBC/hpf   RBC / HPF 11-20  <3 RBC/hpf   Bacteria, UA MANY (*) RARE    Radiology Reports: Dg Chest 2 View  02/04/2014   CLINICAL DATA:  Fever with involuntary jerking.  EXAM: CHEST  2 VIEW  COMPARISON:  03/30/2012.  FINDINGS: Mild cardiac enlargement. No infiltrates or failure. No effusion or pneumothorax. Previous cervical fusion.  IMPRESSION: Cardiomegaly.  No active disease.   Electronically Signed   By: Rolla Flatten M.D.   On: 02/04/2014 17:02    Problem List  Principal Problem:   UTI (  urinary tract infection) Active Problems:   BLADDER CANCER   GOUTY ARTHRITIS, CHRONIC   OSA (obstructive sleep apnea)   PROSTATE CANCER, HX OF   Degenerative lumbar spinal stenosis   Sepsis   DM type 2 (diabetes mellitus, type 2)   Tobacco abuse   Assessment: This is a 68 year old, Caucasian male, with a past medical history as stated earlier, who presents with jerking movements, which appeared to be rigors. Patient was found to have abnormal UA, and considering his urinary symptoms he most likely has a UTI. He does have a history of bladder cancer, which complicates this issue. He also has some back pain and was found to have right upper quadrant tenderness but his LFTs are normal. His back pain is probably from the UTI and rigors. He does not have any focal neurological deficits.  Plan: #1 urinary tract infection with sepsis with chills and rigors: He'll be treated with ceftriaxone. Urine cultures will be followed up on. He'll be given IV fluids. Tylenol will be given for fever. If this improves as expected he can just follow up with his urologist as an outpatient.  #2 lower back pain in the setting of chronic back pain: Probably exacerbated by a rigors and the UTI. He does not have any focal neurological deficits. Continue to monitor closely and if he doesn't improve, as anticipated this might require further workup.  #3 right upper quadrant abdominal pain: LFTs are normal.  He does not have any nausea. This is probably a nonspecific finding. We will repeat LFTs tomorrow morning. And we will monitor him clinically.  #4 dehydration: He'll be given IV fluids.  #5 history of diabetes mellitus, type II: He'll be maintained on sliding scale coverage and his long-acting insulin, but at a slightly lower dose.  #6 history of carotid artery disease, and peripheral vascular disease: Continue with his antiplatelet agents and a statin medications.  #7 history of sleep apnea: Continue with CPAP.  #8 tobacco abuse: Counseling was provided. Nicotine patch will utilize.  #9 history of bladder and prostate cancers: He can follow up with his urologist, as an outpatient.   DVT Prophylaxis: Lovenox Code Status: Full code Family Communication: Discussed with the patient, his wife and his daughter  Disposition Plan: Observe to MedSurg   Further management decisions will depend on results of further testing and patient's response to treatment.   Bonnielee Haff  [facility] Pager [phone]  If 7PM-7AM, please contact night-coverage [email or web address] Password [facility]  02/04/2014, 8:30 PM  Disclaimer: This note was dictated with voice recognition software. Similar sounding words can inadvertently be transcribed and may not be corrected upon review.

## 2014-02-05 DIAGNOSIS — I10 Essential (primary) hypertension: Secondary | ICD-10-CM

## 2014-02-05 DIAGNOSIS — A419 Sepsis, unspecified organism: Secondary | ICD-10-CM

## 2014-02-05 DIAGNOSIS — A4150 Gram-negative sepsis, unspecified: Secondary | ICD-10-CM | POA: Diagnosis present

## 2014-02-05 LAB — COMPREHENSIVE METABOLIC PANEL
ALT: 20 U/L (ref 0–53)
AST: 19 U/L (ref 0–37)
Albumin: 3.3 g/dL — ABNORMAL LOW (ref 3.5–5.2)
Alkaline Phosphatase: 69 U/L (ref 39–117)
BUN: 18 mg/dL (ref 6–23)
CALCIUM: 8.6 mg/dL (ref 8.4–10.5)
CO2: 26 mEq/L (ref 19–32)
Chloride: 106 mEq/L (ref 96–112)
Creatinine, Ser: 1.15 mg/dL (ref 0.50–1.35)
GFR calc Af Amer: 73 mL/min — ABNORMAL LOW (ref >90)
GFR, EST NON AFRICAN AMERICAN: 63 mL/min — AB (ref >90)
Glucose, Bld: 181 mg/dL — ABNORMAL HIGH (ref 70–99)
Potassium: 3.9 mEq/L (ref 3.7–5.3)
SODIUM: 143 meq/L (ref 137–147)
Total Bilirubin: 0.5 mg/dL (ref 0.3–1.2)
Total Protein: 6.5 g/dL (ref 6.0–8.3)

## 2014-02-05 LAB — CBC
HEMATOCRIT: 35.5 % — AB (ref 39.0–52.0)
HEMOGLOBIN: 11.8 g/dL — AB (ref 13.0–17.0)
MCH: 29.9 pg (ref 26.0–34.0)
MCHC: 33.2 g/dL (ref 30.0–36.0)
MCV: 90.1 fL (ref 78.0–100.0)
Platelets: 145 10*3/uL — ABNORMAL LOW (ref 150–400)
RBC: 3.94 MIL/uL — ABNORMAL LOW (ref 4.22–5.81)
RDW: 13.5 % (ref 11.5–15.5)
WBC: 10.2 10*3/uL (ref 4.0–10.5)

## 2014-02-05 LAB — GLUCOSE, CAPILLARY
GLUCOSE-CAPILLARY: 97 mg/dL (ref 70–99)
Glucose-Capillary: 113 mg/dL — ABNORMAL HIGH (ref 70–99)
Glucose-Capillary: 140 mg/dL — ABNORMAL HIGH (ref 70–99)
Glucose-Capillary: 123 mg/dL — ABNORMAL HIGH (ref 70–99)
Glucose-Capillary: 111 mg/dL — ABNORMAL HIGH (ref 70–99)
Glucose-Capillary: 105 mg/dL — ABNORMAL HIGH (ref 70–99)

## 2014-02-05 MED ORDER — FINASTERIDE 5 MG PO TABS
ORAL_TABLET | ORAL | Status: AC
  Filled 2014-02-05: qty 1

## 2014-02-05 MED ORDER — HALOPERIDOL LACTATE 5 MG/ML IJ SOLN
2.5000 mg | Freq: Once | INTRAMUSCULAR | Status: AC
Start: 2014-02-05 — End: 2014-02-05
  Administered 2014-02-05: 2.5 mg via INTRAVENOUS
  Filled 2014-02-05: qty 1

## 2014-02-05 MED ORDER — SODIUM CHLORIDE 0.9 % IV SOLN
INTRAVENOUS | Status: DC
  Administered 2014-02-05: 15:00:00 via INTRAVENOUS

## 2014-02-05 MED ORDER — IBUPROFEN 800 MG PO TABS
400.0000 mg | ORAL_TABLET | Freq: Once | ORAL | Status: AC
  Administered 2014-02-05: 400 mg via ORAL
  Filled 2014-02-05: qty 1

## 2014-02-05 NOTE — Progress Notes (Signed)
CRITICAL VALUE ALERT  Critical value received:  Blood culture X2 positive for gram negative rods  Date of notification:  02/05/2014  Time of notification:  0643  Critical value read back: yes  Nurse who received alert: T.James,RN  MD notified (1st page):  Dr. Maryland Pink  Time of first page:  0645  MD notified (2nd page):  Time of second page:  Responding MD:  Dr. Maryland Pink  Time MD responded:  [phone]

## 2014-02-05 NOTE — Progress Notes (Signed)
UR completed. Patient changed to inpatient- requiring IV antibiotics and IVF

## 2014-02-05 NOTE — Progress Notes (Signed)
[facility] PROGRESS NOTE  [name] LOV:[id number] DOB: [birth year] DOA: 02/04/2014 PCP: Delphina Cahill, MD  Assessment/Plan: 1. Gram-negative sepsis. Patient is on intravenous Rocephin. Followup cultures. Source is likely urine. He is hemodynamically stable. 2. Lower back pain, the setting of chronic back pain. Likely exacerbated by urinary tract infection. Patient feels that this is improving. 3. Diabetes type 2. Continue sliding scale insulin. 4. History of sleep apnea. Continue CPAP 5. History of bladder/prostate cancer. Followup with urologist as an outpatient.  Code Status: full code Family Communication: discussed with patient Disposition Plan: discharge home when improved   Consultants:    Procedures:    Antibiotics:  Rocephin 5/21  HPI/Subjective: Feels that lower back pain is getting better. No shortness of breath or cough  Objective: Filed Vitals:   02/05/14 1100  BP:   Pulse:   Temp: 97.4 F (36.3 C)  Resp:     Intake/Output Summary (Last 24 hours) at 02/05/14 1212 Last data filed at 02/05/14 0800  Gross per 24 hour  Intake 638.75 ml  Output    600 ml  Net  38.75 ml   Filed Weights   02/04/14 1519 02/05/14 0428  Weight: 104.781 kg (231 lb) 101.288 kg (223 lb 4.8 oz)    Exam:   General:  NAD, sitting up on side of bed  Cardiovascular: S1, S2 RRR  Respiratory: CTA B  Abdomen: soft, nt, nd, bs+  Musculoskeletal: no pedal edema   Data Reviewed: Basic Metabolic Panel:  Recent Labs Lab 02/04/14 1640 02/05/14 0600  NA 142 143  K 3.8 3.9  CL 100 106  CO2 28 26  GLUCOSE 128* 181*  BUN 15 18  CREATININE 0.97 1.15  CALCIUM 9.3 8.6   Liver Function Tests:  Recent Labs Lab 02/04/14 1640 02/05/14 0600  AST 26 19  ALT 26 20  ALKPHOS 79 69  BILITOT 0.6 0.5  PROT 7.3 6.5  ALBUMIN 4.0 3.3*   No results found for this basename: LIPASE, AMYLASE,  in the last 168 hours No results found for this basename: AMMONIA,  in the  last 168 hours CBC:  Recent Labs Lab 02/04/14 1640 02/05/14 0600  WBC 8.3 10.2  NEUTROABS 7.3  --   HGB 13.4 11.8*  HCT 39.3 35.5*  MCV 89.9 90.1  PLT 156 145*   Cardiac Enzymes: No results found for this basename: CKTOTAL, CKMB, CKMBINDEX, TROPONINI,  in the last 168 hours BNP (last 3 results) No results found for this basename: PROBNP,  in the last 8760 hours CBG:  Recent Labs Lab 02/05/14 0057 02/05/14 0140 02/05/14 0739 02/05/14 1115  GLUCAP 97 113* 140* 123*    Recent Results (from the past 240 hour(s))  CULTURE, BLOOD (ROUTINE X 2)     Status: None   Collection Time    02/04/14  4:14 PM      Result Value Ref Range Status   Specimen Description BLOOD RIGHT ANTECUBITAL   Final   Special Requests     Final   Value: BOTTLES DRAWN AEROBIC AND ANAEROBIC AEB=10CC ANA=6CC   Culture     Final   Value: GRAM NEGATIVE RODS     Gram Stain Report Called to,Read Back By and Verified With: Ophelia Shoulder RN ON  [phone] AT 0630 BY RESSEGGER R   Report Status PENDING   Incomplete  CULTURE, BLOOD (ROUTINE X 2)     Status: None   Collection Time    02/04/14  4:23 PM  Result Value Ref Range Status   Specimen Description BLOOD RIGHT HAND   Final   Special Requests BOTTLES DRAWN AEROBIC AND ANAEROBIC 10CC   Final   Culture     Final   Value: GRAM NEGATIVE RODS     Gram Stain Report Called to,Read Back By and Verified With: Ophelia Shoulder  RN ON [phone] AT 0630 BY RESSEGGER  R   Report Status PENDING   Incomplete     Studies: Dg Chest 2 View  02/04/2014   CLINICAL DATA:  Fever with involuntary jerking.  EXAM: CHEST  2 VIEW  COMPARISON:  03/30/2012.  FINDINGS: Mild cardiac enlargement. No infiltrates or failure. No effusion or pneumothorax. Previous cervical fusion.  IMPRESSION: Cardiomegaly.  No active disease.   Electronically Signed   By: Rolla Flatten M.D.   On: 02/04/2014 17:02    Scheduled Meds: . allopurinol  300 mg Oral QHS  . aspirin EC  81 mg Oral QHS  . atorvastatin   20 mg Oral q1800  . cefTRIAXone (ROCEPHIN)  IV  1 g Intravenous Q24H  . clopidogrel  75 mg Oral Q breakfast  . docusate sodium  200 mg Oral QHS  . enoxaparin (LOVENOX) injection  40 mg Subcutaneous Q24H  . finasteride  5 mg Oral QHS  . gabapentin  300 mg Oral TID  . insulin aspart  0-15 Units Subcutaneous TID WC  . insulin detemir  6 Units Subcutaneous QHS  . labetalol  300 mg Oral BID  . losartan  100 mg Oral q morning - 10a  . nicotine  14 mg Transdermal Daily  . tamsulosin  0.4 mg Oral QHS   Continuous Infusions:   Principal Problem:   UTI (urinary tract infection) Active Problems:   BLADDER CANCER   GOUTY ARTHRITIS, CHRONIC   OSA (obstructive sleep apnea)   PROSTATE CANCER, HX OF   Degenerative lumbar spinal stenosis   Sepsis   DM type 2 (diabetes mellitus, type 2)   Tobacco abuse    Time spent: 65mins    Jehanzeb Memon  [facility] Pager [phone]. If 7PM-7AM, please contact night-coverage at [email or web address], password [facility] 02/05/2014, 12:12 PM  LOS: 1 day

## 2014-02-05 NOTE — Progress Notes (Signed)
Notified by CNA that patients temp was 103 orally. Patient was very confused and was trying to get out of bed. Patient was also having mild tremors. Patient had received 2 tabs of norco for pain @ 0023. MD was notified and ordered to give 650mg  of PRN tylenol, placed new order for one time dose of 400mg  ibuprofen and 2.5mg  IV haldol. Patient received tylenol, ibuprofen, and haldol. Upon reassessment patient was resting in the bed and was oriented x4. Patients temp was 97.8 orally. Will continue to monitor patient at this time.

## 2014-02-06 LAB — GLUCOSE, CAPILLARY
GLUCOSE-CAPILLARY: 128 mg/dL — AB (ref 70–99)
GLUCOSE-CAPILLARY: 169 mg/dL — AB (ref 70–99)
GLUCOSE-CAPILLARY: 83 mg/dL (ref 70–99)
Glucose-Capillary: 160 mg/dL — ABNORMAL HIGH (ref 70–99)

## 2014-02-06 LAB — BASIC METABOLIC PANEL
BUN: 14 mg/dL (ref 6–23)
CHLORIDE: 106 meq/L (ref 96–112)
CO2: 25 meq/L (ref 19–32)
Calcium: 8.6 mg/dL (ref 8.4–10.5)
Creatinine, Ser: 1.01 mg/dL (ref 0.50–1.35)
GFR calc Af Amer: 86 mL/min — ABNORMAL LOW (ref >90)
GFR calc non Af Amer: 74 mL/min — ABNORMAL LOW (ref >90)
GLUCOSE: 128 mg/dL — AB (ref 70–99)
POTASSIUM: 3.9 meq/L (ref 3.7–5.3)
SODIUM: 142 meq/L (ref 137–147)

## 2014-02-06 LAB — CBC
HCT: 35.3 % — ABNORMAL LOW (ref 39.0–52.0)
HEMOGLOBIN: 11.7 g/dL — AB (ref 13.0–17.0)
MCH: 30 pg (ref 26.0–34.0)
MCHC: 33.1 g/dL (ref 30.0–36.0)
MCV: 90.5 fL (ref 78.0–100.0)
Platelets: 130 10*3/uL — ABNORMAL LOW (ref 150–400)
RBC: 3.9 MIL/uL — AB (ref 4.22–5.81)
RDW: 13.6 % (ref 11.5–15.5)
WBC: 6.6 10*3/uL (ref 4.0–10.5)

## 2014-02-06 NOTE — Progress Notes (Signed)
[facility] PROGRESS NOTE  [name] OJJ:[id number] DOB: [birth year] DOA: 02/04/2014 PCP: Delphina Cahill, MD  Assessment/Plan: 1. Gram-negative sepsis. Patient is on intravenous Rocephin. Still has some low grade fever overnight. Followup cultures. Source is likely urine. He is hemodynamically stable. Continue current treatment 2. Urinary tract infection.  On rocephin. Follow up culture. 3. Lower back pain, the setting of chronic back pain. Likely exacerbated by urinary tract infection. Patient feels that this is improving. 4. Diabetes type 2. Continue sliding scale insulin. 5. History of sleep apnea. Continue CPAP 6. History of bladder/prostate cancer. Followup with urologist as an outpatient.  Code Status: full code Family Communication: discussed with patient Disposition Plan: discharge home when improved   Consultants:    Procedures:    Antibiotics:  Rocephin 5/21  HPI/Subjective: No new complaints, feeling better  Objective: Filed Vitals:   02/06/14 0447  BP: 152/75  Pulse: 71  Temp: 98.4 F (36.9 C)  Resp: 20    Intake/Output Summary (Last 24 hours) at 02/06/14 1409 Last data filed at 02/06/14 1200  Gross per 24 hour  Intake 1098.33 ml  Output      0 ml  Net 1098.33 ml   Filed Weights   02/04/14 1519 02/05/14 0428 02/06/14 0500  Weight: 104.781 kg (231 lb) 101.288 kg (223 lb 4.8 oz) 100.472 kg (221 lb 8 oz)    Exam:   General:  NAD, sitting in chair  Cardiovascular: S1, S2 RRR  Respiratory: CTA B  Abdomen: soft, nt, nd, bs+  Musculoskeletal: no pedal edema   Data Reviewed: Basic Metabolic Panel:  Recent Labs Lab 02/04/14 1640 02/05/14 0600 02/06/14 0538  NA 142 143 142  K 3.8 3.9 3.9  CL 100 106 106  CO2 28 26 25   GLUCOSE 128* 181* 128*  BUN 15 18 14   CREATININE 0.97 1.15 1.01  CALCIUM 9.3 8.6 8.6   Liver Function Tests:  Recent Labs Lab 02/04/14 1640 02/05/14 0600  AST 26 19  ALT 26 20  ALKPHOS 79 69  BILITOT 0.6  0.5  PROT 7.3 6.5  ALBUMIN 4.0 3.3*   No results found for this basename: LIPASE, AMYLASE,  in the last 168 hours No results found for this basename: AMMONIA,  in the last 168 hours CBC:  Recent Labs Lab 02/04/14 1640 02/05/14 0600 02/06/14 0538  WBC 8.3 10.2 6.6  NEUTROABS 7.3  --   --   HGB 13.4 11.8* 11.7*  HCT 39.3 35.5* 35.3*  MCV 89.9 90.1 90.5  PLT 156 145* 130*   Cardiac Enzymes: No results found for this basename: CKTOTAL, CKMB, CKMBINDEX, TROPONINI,  in the last 168 hours BNP (last 3 results) No results found for this basename: PROBNP,  in the last 8760 hours CBG:  Recent Labs Lab 02/05/14 1115 02/05/14 1616 02/05/14 2143 02/06/14 0718 02/06/14 1135  GLUCAP 123* 111* 105* 128* 169*    Recent Results (from the past 240 hour(s))  CULTURE, BLOOD (ROUTINE X 2)     Status: None   Collection Time    02/04/14  4:14 PM      Result Value Ref Range Status   Specimen Description BLOOD RIGHT ANTECUBITAL   Final   Special Requests     Final   Value: BOTTLES DRAWN AEROBIC AND ANAEROBIC AEB=10CC ANA=6CC   Culture  Setup Time     Final   Value: 02/05/2014 14:52     Performed at [place]     Final  Value: GRAM NEGATIVE RODS        BLOOD CULTURE RECEIVED NO GROWTH TO DATE CULTURE WILL BE HELD FOR 5 DAYS BEFORE ISSUING A FINAL NEGATIVE REPORT     Note: Gram Stain Report Called to,Read Back By and Verified With: Ophelia Shoulder RN AT 0630 02/05/14 BY RESSEGGER R Performed at [facility]     Performed at [facility]   Report Status PENDING   Incomplete  CULTURE, BLOOD (ROUTINE X 2)     Status: None   Collection Time    02/04/14  4:23 PM      Result Value Ref Range Status   Specimen Description BLOOD RIGHT HAND   Final   Special Requests BOTTLES DRAWN AEROBIC AND ANAEROBIC 10CC   Final   Culture  Setup Time     Final   Value: 02/05/2014 14:50     Performed at [facility]   Culture     Final   Value: GRAM NEGATIVE RODS         BLOOD CULTURE RECEIVED NO GROWTH TO DATE CULTURE WILL BE HELD FOR 5 DAYS BEFORE ISSUING A FINAL NEGATIVE REPORT     Performed at [facility]   Report Status PENDING   Incomplete  URINE CULTURE     Status: None   Collection Time    02/04/14  5:31 PM      Result Value Ref Range Status   Specimen Description URINE, CLEAN CATCH   Final   Special Requests NONE   Final   Culture  Setup Time     Final   Value: 02/05/2014 14:41     Performed at [place]     Final   Value: >=100,000 COLONIES/ML     Performed at [facility]   Culture     Final   Value: ESCHERICHIA COLI     Performed at [facility]   Report Status PENDING   Incomplete     Studies: Dg Chest 2 View  02/04/2014   CLINICAL DATA:  Fever with involuntary jerking.  EXAM: CHEST  2 VIEW  COMPARISON:  03/30/2012.  FINDINGS: Mild cardiac enlargement. No infiltrates or failure. No effusion or pneumothorax. Previous cervical fusion.  IMPRESSION: Cardiomegaly.  No active disease.   Electronically Signed   By: Rolla Flatten M.D.   On: 02/04/2014 17:02    Scheduled Meds: . allopurinol  300 mg Oral QHS  . aspirin EC  81 mg Oral QHS  . atorvastatin  20 mg Oral q1800  . cefTRIAXone (ROCEPHIN)  IV  1 g Intravenous Q24H  . clopidogrel  75 mg Oral Q breakfast  . docusate sodium  200 mg Oral QHS  . enoxaparin (LOVENOX) injection  40 mg Subcutaneous Q24H  . finasteride  5 mg Oral QHS  . gabapentin  300 mg Oral TID  . insulin aspart  0-15 Units Subcutaneous TID WC  . insulin detemir  6 Units Subcutaneous QHS  . labetalol  300 mg Oral BID  . losartan  100 mg Oral q morning - 10a  . nicotine  14 mg Transdermal Daily  . tamsulosin  0.4 mg Oral QHS   Continuous Infusions:   Principal Problem:   UTI (urinary tract infection) Active Problems:   BLADDER CANCER   GOUTY ARTHRITIS, CHRONIC   OSA (obstructive sleep apnea)   PROSTATE CANCER, HX OF   Degenerative lumbar spinal stenosis    Sepsis   DM  type 2 (diabetes mellitus, type 2)   Tobacco abuse   Gram negative sepsis    Time spent: 25mins    Jehanzeb Memon  [facility] Pager [phone]. If 7PM-7AM, please contact night-coverage at [email or web address], password [facility] 02/06/2014, 2:09 PM  LOS: 2 days

## 2014-02-07 LAB — CBC
HCT: 36.3 % — ABNORMAL LOW (ref 39.0–52.0)
Hemoglobin: 12.1 g/dL — ABNORMAL LOW (ref 13.0–17.0)
MCH: 29.9 pg (ref 26.0–34.0)
MCHC: 33.3 g/dL (ref 30.0–36.0)
MCV: 89.6 fL (ref 78.0–100.0)
PLATELETS: 142 10*3/uL — AB (ref 150–400)
RBC: 4.05 MIL/uL — ABNORMAL LOW (ref 4.22–5.81)
RDW: 13.4 % (ref 11.5–15.5)
WBC: 6 10*3/uL (ref 4.0–10.5)

## 2014-02-07 LAB — GLUCOSE, CAPILLARY
GLUCOSE-CAPILLARY: 150 mg/dL — AB (ref 70–99)
Glucose-Capillary: 145 mg/dL — ABNORMAL HIGH (ref 70–99)
Glucose-Capillary: 124 mg/dL — ABNORMAL HIGH (ref 70–99)

## 2014-02-07 LAB — BASIC METABOLIC PANEL
BUN: 14 mg/dL (ref 6–23)
CO2: 25 mEq/L (ref 19–32)
CREATININE: 1.02 mg/dL (ref 0.50–1.35)
Calcium: 9.2 mg/dL (ref 8.4–10.5)
Chloride: 103 mEq/L (ref 96–112)
GFR calc Af Amer: 85 mL/min — ABNORMAL LOW (ref >90)
GFR, EST NON AFRICAN AMERICAN: 73 mL/min — AB (ref >90)
Glucose, Bld: 141 mg/dL — ABNORMAL HIGH (ref 70–99)
Potassium: 4 mEq/L (ref 3.7–5.3)
SODIUM: 140 meq/L (ref 137–147)

## 2014-02-07 LAB — URINE CULTURE

## 2014-02-07 LAB — CULTURE, BLOOD (ROUTINE X 2)

## 2014-02-07 MED ORDER — CIPROFLOXACIN HCL 500 MG PO TABS: 500.0000 mg | ORAL_TABLET | Freq: Two times a day (BID) | ORAL | Status: DC

## 2014-02-07 NOTE — Progress Notes (Signed)
IV removed, site WNL.  Pt given d/c instructions and new prescriptions.  Discussed all home medications (when, how, and why to take), patient verbalizes understanding. Discussed home care (importance of antibiotics and drinking plenty of fluids, etc) with patient, teachback completed. F/U appointment will be made by patient when offices open Tuesday, pt states they will keep appointment. Pt is stable at this time. Pt taken to main entrance in wheelchair by staff member.

## 2014-02-07 NOTE — Discharge Instructions (Signed)
Urosepsis Urosepsis is a severe illness that occurs when an infection starts in your urinary tract and spreads into your bloodstream. Urosepsis can be life threatening if it is not treated immediately. CAUSES  It is not known why urosepsis develops in some people with a urinary tract infection. RISK FACTORS Factors that increase your risk of developing urosepsis include:  Advanced age.  Having diabetes mellitus.  Having a weakened immune system.  Having kidney stones. SIGNS AND SYMPTOMS  Symptoms of urosepsis can include:  Fever or low body temperature (hypothermia).  Rapid breathing (hyperventilation).  Chills.  Rapid heartbeat (tachycardia). When sepsis is severe, low blood pressure and decreased oxygen flow to your vital organs may also occur. This is called shock. DIAGNOSIS  Your health care provider may suspect urosepsis based on your medical history and a physical exam. Urine and blood tests may be done to confirm a diagnosis of urosepsis. Other tests, such as X-ray exams, ultrasound exams, or a CT scan, may be done to determine the severity of your condition.  TREATMENT  Urosepsis requires prompt treatment with medicines. Fluids will be given to you through an IV tube to support your blood pressure. Sometimes, strong medicines are used to increase your blood pressure if necessary. Medicines can also be used to control pain or nausea.  HOME CARE INSTRUCTIONS   Take your medicines as directed. Finish them even if you start to feel better.  Drink enough fluids to keep your urine clear or pale yellow. Cranberry juice is especially recommended, in addition to water.  Increase your activity as tolerated, but get plenty of rest.  Keep all follow-up appointments with your health care provider for checkups and any tests.  Avoid caffeine, tea, and carbonated beverages. They can irritate the bladder.  Avoid alcohol. It may irritate the prostate, if this applies.  Only take  over-the-counter or prescription medicines for pain, discomfort, or fever as directed by your health care provider.  Empty your bladder often. Avoid holding urine for long periods of time.  After a bowel movement, women should wipe from front to back. Use each tissue only once.  Empty your bladder before and after sexual intercourse. SEEK MEDICAL CARE IF:   You develop back pain.  You develop nausea or vomiting.  Your symptoms are not better after 3 days. SEEK IMMEDIATE MEDICAL CARE IF:   You feel lightheaded or develop shortness of breath.  You develop chills or a fever.  You are getting worse, not better. MAKE SURE YOU:  Understand these instructions.  Will watch your condition.  Will get help right away if you are not doing well or get worse. Document Released: 09/03/2005 Document Revised: 06/24/2013 Document Reviewed: 05/11/2013 [facility] Patient Information 2014 [place].

## 2014-02-07 NOTE — Discharge Summary (Signed)
Physician Discharge Summary  [name] VQQ:[id number] DOB: Nov 16, 1945 DOA: 02/04/2014  PCP: Delphina Cahill, MD  Admit date: 02/04/2014 Discharge date: 02/07/2014  Time spent: 40 minutes  Recommendations for Outpatient Follow-up:  1. Patient will be discharged home 2. Follow up with primary care doctor in 1-2 weeks  Discharge Diagnoses:  Principal Problem:   UTI (urinary tract infection) Active Problems:   BLADDER CANCER   GOUTY ARTHRITIS, CHRONIC   OSA (obstructive sleep apnea)   PROSTATE CANCER, HX OF   Degenerative lumbar spinal stenosis   Sepsis   DM type 2 (diabetes mellitus, type 2)   Tobacco abuse   Gram negative sepsis   Discharge Condition: improved  Diet recommendation: low salt  Filed Weights   02/05/14 0428 02/06/14 0500 02/07/14 0500  Weight: 101.288 kg (223 lb 4.8 oz) 100.472 kg (221 lb 8 oz) 100.336 kg (221 lb 3.2 oz)    History of present illness:  [name] is a 68 y.o. male with a past medical history that is quite extensive and includes gout, chronic back pain due to degenerative disc disease, obstructive sleep apnea, diabetes on insulin, hypertension, carotid artery disease, bladder, and prostate cancers, who underwent bladder surgery back in December. He had 2 UTIs following this, but none in April. He was in his usual state of health until early afternoon today, when he started having jerking movements. According to his daughter, who is the ED nurse he was shaking all over, but was awake, and was talking. It was not a seizure type activity. He had pain in his back. He has noticed some slow stream with urination recently with frequent urination. Some discomfort as well, but he is unable to specify. He has felt hot and cold. When his daughter evaluated him he was hot, so she gave him ibuprofen. However, temperature was not checked at home. There was no nausea, vomiting. No syncopal episodes. He had been constipated recently and had taken mag citrate and had a  bowel movement in the ED. He was brought into the emergency department and his temperature rectally was 102.8. He was given Tylenol, IV fluids, and he's feeling slightly improved.   Hospital Course:  This patient was admitted to the hospital with fevers, Reiter's. He was found to have a urinary tract infection and associated Escherichia coli sepsis. He was started on intravenous Rocephin. Blood cultures returned positive for Escherichia coli. Urine culture also positive for Escherichia coli. He was transitioned to oral ciprofloxacin based on blood culture sensitivities. He is remained afebrile for 24 hours. Patient is ambulating without difficulty and feels back to his baseline level of functioning. The patient is eagerly awaiting discharge home. The remainder of his lab work is unremarkable. He'll be prescribed a total of 2 weeks of antibiotics. He'll follow up with primary care physician in 1-2 weeks.  Procedures:    Consultations:    Discharge Exam: Filed Vitals:   02/07/14 1459  BP: 159/66  Pulse: 66  Temp: 98 F (36.7 C)  Resp: 20    General: NAD Cardiovascular: S1, S2 RRR Respiratory: CTA B  Discharge Instructions You were cared for by a hospitalist during your hospital stay. If you have any questions about your discharge medications or the care you received while you were in the hospital after you are discharged, you can call the unit and asked to speak with the hospitalist on call if the hospitalist that took care of you is not available. Once you are discharged, your primary  care physician will handle any further medical issues. Please note that NO REFILLS for any discharge medications will be authorized once you are discharged, as it is imperative that you return to your primary care physician (or establish a relationship with a primary care physician if you do not have one) for your aftercare needs so that they can reassess your need for medications and monitor your lab  values.  Discharge Instructions   Call MD for:  extreme fatigue    Complete by:  As directed      Call MD for:  temperature >100.4    Complete by:  As directed      Diet - low sodium heart healthy    Complete by:  As directed      Increase activity slowly    Complete by:  As directed             Medication List    STOP taking these medications       ibuprofen 200 MG tablet  Commonly known as:  ADVIL,MOTRIN      TAKE these medications       allopurinol 300 MG tablet  Commonly known as:  ZYLOPRIM  Take 300 mg by mouth at bedtime.     aspirin 81 MG EC tablet  Take 81 mg by mouth at bedtime.     celecoxib 200 MG capsule  Commonly known as:  CELEBREX  Take 200 mg by mouth every morning.     cholecalciferol 1000 UNITS tablet  Commonly known as:  VITAMIN D  Take 1,000 Units by mouth daily.     ciprofloxacin 500 MG tablet  Commonly known as:  CIPRO  Take 1 tablet (500 mg total) by mouth 2 (two) times daily.     clopidogrel 75 MG tablet  Commonly known as:  PLAVIX  Take 75 mg by mouth daily with breakfast.     colchicine 0.6 MG tablet  Take 0.6 mg by mouth daily as needed. For gout     docusate sodium 100 MG capsule  Commonly known as:  COLACE  Take 200 mg by mouth at bedtime.     finasteride 5 MG tablet  Commonly known as:  PROSCAR  Take 5 mg by mouth at bedtime.     furosemide 40 MG tablet  Commonly known as:  LASIX  Take 40 mg by mouth every morning. 1 tablet po prn for SOB or swelling     gabapentin 300 MG capsule  Commonly known as:  NEURONTIN  Take 300 mg by mouth 3 (three) times daily.     HYDROcodone-acetaminophen 5-325 MG per tablet  Commonly known as:  NORCO/VICODIN  Take 1-2 tablets by mouth every 4 (four) hours as needed for moderate pain.     insulin detemir 100 UNIT/ML injection  Commonly known as:  LEVEMIR  Inject 14 Units into the skin at bedtime.     labetalol 100 MG tablet  Commonly known as:  NORMODYNE  Take 300 mg by mouth 2  (two) times daily. Take 3 (100 mg) tablets twice a day.     losartan 100 MG tablet  Commonly known as:  COZAAR  Take 100 mg by mouth every morning.     niacin 1000 MG CR tablet  Commonly known as:  NIASPAN  Take 2,000 mg by mouth at bedtime.     omega-3 acid ethyl esters 1 G capsule  Commonly known as:  LOVAZA  Take 1 g by mouth 2 (two) times daily.  Potassium Gluconate 595 MG Caps  Take 1 capsule by mouth daily.     rosuvastatin 10 MG tablet  Commonly known as:  CRESTOR  Take 10 mg by mouth every morning.     tamsulosin 0.4 MG Caps capsule  Commonly known as:  FLOMAX  Take 0.4 mg by mouth at bedtime.       Allergies  Allergen Reactions  . Toradol [Ketorolac Tromethamine] Other (See Comments)    Causes hallucinations  . Pregabalin Nausea Only and Other (See Comments)    Make me feel "bad"       Follow-up Information   Follow up with Delphina Cahill, MD. Schedule an appointment as soon as possible for a visit in 1 week.   Specialty:  Internal Medicine   Contact information:    [place] [address] [phone]        The results of significant diagnostics from this hospitalization (including imaging, microbiology, ancillary and laboratory) are listed below for reference.    Significant Diagnostic Studies: Dg Chest 2 View  02/04/2014   CLINICAL DATA:  Fever with involuntary jerking.  EXAM: CHEST  2 VIEW  COMPARISON:  03/30/2012.  FINDINGS: Mild cardiac enlargement. No infiltrates or failure. No effusion or pneumothorax. Previous cervical fusion.  IMPRESSION: Cardiomegaly.  No active disease.   Electronically Signed   By: Rolla Flatten M.D.   On: 02/04/2014 17:02    Microbiology: Recent Results (from the past 240 hour(s))  CULTURE, BLOOD (ROUTINE X 2)     Status: None   Collection Time    02/04/14  4:14 PM      Result Value Ref Range Status   Specimen Description BLOOD RIGHT ANTECUBITAL   Final   Special Requests     Final   Value: BOTTLES  DRAWN AEROBIC AND ANAEROBIC AEB=10CC ANA=6CC   Culture  Setup Time     Final   Value: 02/05/2014 14:52     Performed at [facility]   Culture     Final   Value: ESCHERICHIA COLI     Note: Gram Stain Report Called to,Read Back By and Verified With: Ophelia Shoulder RN AT 0630 02/05/14 BY RESSEGGER R Performed at [facility]     Performed at [facility]   Report Status 02/07/2014 FINAL   Final   Organism ID, Bacteria ESCHERICHIA COLI   Final  CULTURE, BLOOD (ROUTINE X 2)     Status: None   Collection Time    02/04/14  4:23 PM      Result Value Ref Range Status   Specimen Description BLOOD RIGHT HAND   Final   Special Requests BOTTLES DRAWN AEROBIC AND ANAEROBIC 10CC   Final   Culture  Setup Time     Final   Value: 02/05/2014 14:50     Performed at [facility]   Culture     Final   Value: ESCHERICHIA COLI     Note: SUSCEPTIBILITIES PERFORMED ON PREVIOUS CULTURE WITHIN THE LAST 5 DAYS.     Performed at [facility]   Report Status 02/07/2014 FINAL   Final  URINE CULTURE     Status: None   Collection Time    02/04/14  5:31 PM      Result Value Ref Range Status   Specimen Description URINE, CLEAN CATCH   Final   Special Requests NONE   Final   Culture  Setup Time     Final  Value: 02/05/2014 14:41     Performed at [facility]     Final   Value: >=100,000 COLONIES/ML     Performed at [facility]   Culture     Final   Value: ESCHERICHIA COLI     Performed at [facility]   Report Status 02/07/2014 FINAL   Final   Organism ID, Bacteria ESCHERICHIA COLI   Final     Labs: Basic Metabolic Panel:  Recent Labs Lab 02/04/14 1640 02/05/14 0600 02/06/14 0538 02/07/14 0602  NA 142 143 142 140  K 3.8 3.9 3.9 4.0  CL 100 106 106 103  CO2 28 26 25 25   GLUCOSE 128* 181* 128* 141*  BUN 15 18 14 14   CREATININE 0.97 1.15 1.01 1.02  CALCIUM 9.3 8.6 8.6 9.2   Liver Function Tests:  Recent Labs Lab  02/04/14 1640 02/05/14 0600  AST 26 19  ALT 26 20  ALKPHOS 79 69  BILITOT 0.6 0.5  PROT 7.3 6.5  ALBUMIN 4.0 3.3*   No results found for this basename: LIPASE, AMYLASE,  in the last 168 hours No results found for this basename: AMMONIA,  in the last 168 hours CBC:  Recent Labs Lab 02/04/14 1640 02/05/14 0600 02/06/14 0538 02/07/14 0602  WBC 8.3 10.2 6.6 6.0  NEUTROABS 7.3  --   --   --   HGB 13.4 11.8* 11.7* 12.1*  HCT 39.3 35.5* 35.3* 36.3*  MCV 89.9 90.1 90.5 89.6  PLT 156 145* 130* 142*   Cardiac Enzymes: No results found for this basename: CKTOTAL, CKMB, CKMBINDEX, TROPONINI,  in the last 168 hours BNP: BNP (last 3 results) No results found for this basename: PROBNP,  in the last 8760 hours CBG:  Recent Labs Lab 02/06/14 1619 02/06/14 2029 02/07/14 0740 02/07/14 1105 02/07/14 1613  GLUCAP 83 160* 145* 150* 124*       Signed:  Ryszard Socarras  [facility] 02/07/2014, 6:50 PM

## 2014-03-14 ENCOUNTER — Encounter (HOSPITAL_COMMUNITY): Payer: Self-pay | Admitting: Emergency Medicine

## 2014-03-14 ENCOUNTER — Emergency Department (HOSPITAL_COMMUNITY): Payer: Medicare Other

## 2014-03-14 ENCOUNTER — Emergency Department (HOSPITAL_COMMUNITY)
Admission: EM | Admit: 2014-03-14 | Discharge: 2014-03-14 | Disposition: A | Discharge status: Home / Self Care | Payer: Medicare Other | Attending: Emergency Medicine | Admitting: Emergency Medicine

## 2014-03-14 DIAGNOSIS — Z8551 Personal history of malignant neoplasm of bladder: Secondary | ICD-10-CM | POA: Insufficient documentation

## 2014-03-14 DIAGNOSIS — Z8546 Personal history of malignant neoplasm of prostate: Secondary | ICD-10-CM | POA: Insufficient documentation

## 2014-03-14 DIAGNOSIS — G4733 Obstructive sleep apnea (adult) (pediatric): Secondary | ICD-10-CM | POA: Insufficient documentation

## 2014-03-14 DIAGNOSIS — Z8739 Personal history of other diseases of the musculoskeletal system and connective tissue: Secondary | ICD-10-CM | POA: Insufficient documentation

## 2014-03-14 DIAGNOSIS — F17200 Nicotine dependence, unspecified, uncomplicated: Secondary | ICD-10-CM | POA: Insufficient documentation

## 2014-03-14 DIAGNOSIS — K5289 Other specified noninfective gastroenteritis and colitis: Secondary | ICD-10-CM | POA: Insufficient documentation

## 2014-03-14 DIAGNOSIS — Z8669 Personal history of other diseases of the nervous system and sense organs: Secondary | ICD-10-CM | POA: Insufficient documentation

## 2014-03-14 DIAGNOSIS — Z794 Long term (current) use of insulin: Secondary | ICD-10-CM | POA: Insufficient documentation

## 2014-03-14 DIAGNOSIS — Z79899 Other long term (current) drug therapy: Secondary | ICD-10-CM | POA: Insufficient documentation

## 2014-03-14 DIAGNOSIS — E119 Type 2 diabetes mellitus without complications: Secondary | ICD-10-CM | POA: Insufficient documentation

## 2014-03-14 DIAGNOSIS — Z7902 Long term (current) use of antithrombotics/antiplatelets: Secondary | ICD-10-CM | POA: Insufficient documentation

## 2014-03-14 DIAGNOSIS — I2510 Atherosclerotic heart disease of native coronary artery without angina pectoris: Secondary | ICD-10-CM | POA: Insufficient documentation

## 2014-03-14 DIAGNOSIS — I10 Essential (primary) hypertension: Secondary | ICD-10-CM | POA: Insufficient documentation

## 2014-03-14 DIAGNOSIS — Z7982 Long term (current) use of aspirin: Secondary | ICD-10-CM | POA: Insufficient documentation

## 2014-03-14 DIAGNOSIS — Z9981 Dependence on supplemental oxygen: Secondary | ICD-10-CM | POA: Insufficient documentation

## 2014-03-14 DIAGNOSIS — K529 Noninfective gastroenteritis and colitis, unspecified: Secondary | ICD-10-CM

## 2014-03-14 DIAGNOSIS — E785 Hyperlipidemia, unspecified: Secondary | ICD-10-CM | POA: Insufficient documentation

## 2014-03-14 LAB — CBC WITH DIFFERENTIAL/PLATELET
BASOS PCT: 1 % (ref 0–1)
Basophils Absolute: 0 10*3/uL (ref 0.0–0.1)
EOS PCT: 1 % (ref 0–5)
Eosinophils Absolute: 0 10*3/uL (ref 0.0–0.7)
HCT: 39.7 % (ref 39.0–52.0)
Hemoglobin: 14 g/dL (ref 13.0–17.0)
LYMPHS ABS: 0.8 10*3/uL (ref 0.7–4.0)
Lymphocytes Relative: 11 % — ABNORMAL LOW (ref 12–46)
MCH: 31.2 pg (ref 26.0–34.0)
MCHC: 35.3 g/dL (ref 30.0–36.0)
MCV: 88.4 fL (ref 78.0–100.0)
Monocytes Absolute: 0.7 10*3/uL (ref 0.1–1.0)
Monocytes Relative: 10 % (ref 3–12)
Neutro Abs: 5.9 10*3/uL (ref 1.7–7.7)
Neutrophils Relative %: 77 % (ref 43–77)
PLATELETS: 131 10*3/uL — AB (ref 150–400)
RBC: 4.49 MIL/uL (ref 4.22–5.81)
RDW: 13.1 % (ref 11.5–15.5)
WBC: 7.6 10*3/uL (ref 4.0–10.5)

## 2014-03-14 LAB — BASIC METABOLIC PANEL
BUN: 14 mg/dL (ref 6–23)
CALCIUM: 9 mg/dL (ref 8.4–10.5)
CO2: 27 mEq/L (ref 19–32)
Chloride: 99 mEq/L (ref 96–112)
Creatinine, Ser: 0.89 mg/dL (ref 0.50–1.35)
GFR, EST NON AFRICAN AMERICAN: 85 mL/min — AB (ref >90)
GLUCOSE: 140 mg/dL — AB (ref 70–99)
Potassium: 3.7 mEq/L (ref 3.7–5.3)
Sodium: 136 mEq/L — ABNORMAL LOW (ref 137–147)

## 2014-03-14 LAB — URINALYSIS, ROUTINE W REFLEX MICROSCOPIC
BILIRUBIN URINE: NEGATIVE
Glucose, UA: NEGATIVE mg/dL
Ketones, ur: NEGATIVE mg/dL
Leukocytes, UA: NEGATIVE
NITRITE: NEGATIVE
PROTEIN: NEGATIVE mg/dL
Specific Gravity, Urine: <1.005 — ABNORMAL LOW (ref 1.005–1.030)
UROBILINOGEN UA: 0.2 mg/dL (ref 0.0–1.0)
pH: 6 (ref 5.0–8.0)

## 2014-03-14 LAB — URINE MICROSCOPIC-ADD ON

## 2014-03-14 LAB — LACTIC ACID, PLASMA: Lactic Acid, Venous: 1.3 mmol/L (ref 0.5–2.2)

## 2014-03-14 MED ORDER — HYDROMORPHONE HCL PF 1 MG/ML IJ SOLN
1.0000 mg | Freq: Once | INTRAMUSCULAR | Status: AC
  Administered 2014-03-14: 1 mg via INTRAVENOUS
  Filled 2014-03-14: qty 1

## 2014-03-14 MED ORDER — IOHEXOL 300 MG/ML  SOLN
100.0000 mL | Freq: Once | INTRAMUSCULAR | Status: AC | PRN
  Administered 2014-03-14: 100 mL via INTRAVENOUS

## 2014-03-14 MED ORDER — METRONIDAZOLE 500 MG PO TABS
500.0000 mg | ORAL_TABLET | Freq: Once | ORAL | Status: AC
  Administered 2014-03-14: 500 mg via ORAL
  Filled 2014-03-14: qty 1

## 2014-03-14 MED ORDER — ONDANSETRON 4 MG PO TBDP: 4.0000 mg | ORAL_TABLET | Freq: Three times a day (TID) | ORAL | Status: DC | PRN

## 2014-03-14 MED ORDER — IOHEXOL 300 MG/ML  SOLN
50.0000 mL | Freq: Once | INTRAMUSCULAR | Status: AC | PRN
  Administered 2014-03-14: 50 mL via ORAL

## 2014-03-14 MED ORDER — SODIUM CHLORIDE 0.9 % IV SOLN
INTRAVENOUS | Status: DC
  Administered 2014-03-14: 14:00:00 via INTRAVENOUS

## 2014-03-14 MED ORDER — CIPROFLOXACIN HCL 250 MG PO TABS
500.0000 mg | ORAL_TABLET | Freq: Once | ORAL | Status: AC
  Administered 2014-03-14: 500 mg via ORAL
  Filled 2014-03-14: qty 2

## 2014-03-14 MED ORDER — METRONIDAZOLE 500 MG PO TABS: 500.0000 mg | ORAL_TABLET | Freq: Two times a day (BID) | ORAL | Status: AC

## 2014-03-14 MED ORDER — CIPROFLOXACIN HCL 500 MG PO TABS: 500.0000 mg | ORAL_TABLET | Freq: Two times a day (BID) | ORAL | Status: AC

## 2014-03-14 MED ORDER — OXYCODONE-ACETAMINOPHEN 10-325 MG PO TABS: 1.0000 | ORAL_TABLET | Freq: Four times a day (QID) | ORAL | Status: DC | PRN

## 2014-03-14 MED ORDER — ACETAMINOPHEN 325 MG PO TABS
650.0000 mg | ORAL_TABLET | Freq: Once | ORAL | Status: AC
  Administered 2014-03-14: 650 mg via ORAL
  Filled 2014-03-14: qty 2

## 2014-03-14 NOTE — Discharge Instructions (Signed)
As discussed, it is important that you follow up as soon as possible with your physicians for continued management of your condition. ° °If you develop any new, or concerning changes in your condition, please return to the emergency department immediately. ° ° ° °

## 2014-03-14 NOTE — ED Notes (Signed)
Patient with no complaints at this time. Respirations even and unlabored. Skin warm/dry. Discharge instructions reviewed with patient at this time. Patient given opportunity to voice concerns/ask questions. IV removed per policy and band-aid applied to site. Patient discharged at this time and left Emergency Department with steady gait.  

## 2014-03-14 NOTE — ED Notes (Signed)
Patient with c/o fever since last night and lower back pain with right flank pain. Alert/oriented. Nausea with dry heaves. Reports dysuria. Dx'd with urosepsis with hospitalization x 1 month ago. Urology appointment next week with Dr Jasmine December in [place].

## 2014-03-14 NOTE — ED Notes (Signed)
Per daughter ibuprofen given at 1250 today for fever.

## 2014-03-14 NOTE — ED Provider Notes (Signed)
CSN: [id number]     Arrival date & time 03/14/14  1331 History  This chart was scribed for Carmin Muskrat, MD by Elby Beck, ED Scribe. This patient was seen in room APA10/APA10 and the patient's care was started at 2:00 PM.   Chief Complaint  Patient presents with  . Fever    The history is provided by the patient. No language interpreter was used.    HPI Comments: [name] is a 68 y.o. Male with a history of DM, HTN, prostate/bladder CA who presents to the Emergency Department complaining of an intermittent fever onset last night.His temperature in the ED was taken to be 102.3 F. He reports associated chills, nausea with dry heaves since last night. He also states that he has noticed a "knot" on his back, which has been painful. He states that he has also been constipated for the past 2-3 days. Pt was hospitalized for urosepsis about 1 month ago. Pt reports that he has a Urology appointment next week with Dr Jasmine December in [place]. He denies any confusion, difficulty speaking or syncope.   Past Medical History  Diagnosis Date  . Hyperlipidemia   . Type 2 diabetes mellitus   . History of prostate cancer   . Hypertension   . Coronary artery disease     CARDIOLOGIST-- DR Rockey Situ [name])  . OA (osteoarthritis)   . Cervical fusion syndrome     LEFT ARM NUMBNESS--  CONTROLLED W/ GABAPENTIN  . OSA on CPAP     BiPAP  . DDD (degenerative disc disease), cervical   . Spondylosis, cervical   . DDD (degenerative disc disease), lumbar   . Occlusion and stenosis of carotid artery without mention of cerebral infarction CARDIOLOGIST  -- DR Rockey Situ    DUPLEX  08-05-2012  RICA  54-27%/   LICA 0-62%  . Nocturia   . Urgency of urination   . Bladder cancer     hx turbt in 2004  . Meningitis    Past Surgical History  Procedure Laterality Date  . Cervical fusion  05-11-2005    C3  --  C7  . Septoplasty  1998  . Colonoscopy  04/2011  . Yag laser application  37/62/8315     Procedure: YAG LASER APPLICATION;  Surgeon: Elta Guadeloupe T. Gershon Crane, MD;  Location: [facility];  Service: Ophthalmology;  Laterality: Right;  . Radioactive prostate seed implants  08-19-2003  . Lumbar fusion  03-26-2012    L3 --  L5  . Cataract extraction w/ intraocular lens  implant, bilateral  2013  . Appendectomy  1990'S  . Anterior removal cage and plate c3-c7/ corpectomy c7 (fx)/  allograft and fusion c3 -- t1/  posterior decompression bilateral laminectomy c4 -- 6 & partial c3/  posterolateral arthrodesis c3 - t1  06-05-2005  . Intraoperative arteriogram  CATH LAB  01-05-2009  DR Einar Gip    RICA   ACUTE ANGLE 80-85% STENOSIS  . Endoscopic repair csf leak via nasal passage  2011  . Cystoscopy w/ retrogrades Bilateral 08/19/2013    Procedure: CYSTOSCOPY WITH RETROGRADE PYELOGRAM;  Surgeon: Molli Hazard, MD;  Location: [facility];  Service: Urology;  Laterality: Bilateral;  . Transurethral resection of bladder tumor N/A 08/19/2013    Procedure: TRANSURETHRAL RESECTION OF BLADDER TUMOR (TURBT);  Surgeon: Molli Hazard, MD;  Location: [facility];  Service: Urology;  Laterality: N/A;   Family History  Problem Relation Age of Onset  . Cancer Other  Family hx  . Diabetes Other     family hx  . Arthritis Other     family hx  . Cancer Mother   . Heart disease Father     MI  . Colon cancer Brother    History  Substance Use Topics  . Smoking status: Current Every Day Smoker -- 1.00 packs/day for 40 years    Types: Cigarettes  . Smokeless tobacco: Never Used     Comment: pt quits smoking periodically,   states has stopped smoking 08-06-2013 (approx) this time  . Alcohol Use: No    Review of Systems  Constitutional:       Per HPI, otherwise negative  HENT:       Per HPI, otherwise negative  Respiratory:       Per HPI, otherwise negative  Cardiovascular:       Per HPI, otherwise negative  Gastrointestinal: Negative for vomiting.  Endocrine:        Negative aside from HPI  Genitourinary:       Neg aside from HPI   Musculoskeletal:       Per HPI, otherwise negative  Skin: Negative.   Neurological: Negative for syncope.     Allergies  Toradol and Pregabalin  Home Medications   Prior to Admission medications   Medication Sig Start Date End Date Taking? Authorizing Seab Axel  allopurinol (ZYLOPRIM) 300 MG tablet Take 300 mg by mouth at bedtime.     Historical Deagan Sevin, MD  aspirin 81 MG EC tablet Take 81 mg by mouth at bedtime.     Historical Blanca Carreon, MD  celecoxib (CELEBREX) 200 MG capsule Take 200 mg by mouth every morning.    Historical Willowdean Luhmann, MD  cholecalciferol (VITAMIN D) 1000 UNITS tablet Take 1,000 Units by mouth daily.    Historical Jissell Trafton, MD  ciprofloxacin (CIPRO) 500 MG tablet Take 1 tablet (500 mg total) by mouth 2 (two) times daily. 02/07/14   Kathie Dike, MD  clopidogrel (PLAVIX) 75 MG tablet Take 75 mg by mouth daily with breakfast.  07/31/13   Historical Pinkey Mcjunkin, MD  colchicine 0.6 MG tablet Take 0.6 mg by mouth daily as needed. For gout    Historical Gabriella Guile, MD  docusate sodium (COLACE) 100 MG capsule Take 200 mg by mouth at bedtime.    Historical Toran Murch, MD  finasteride (PROSCAR) 5 MG tablet Take 5 mg by mouth at bedtime.    Historical Rondi Ivy, MD  furosemide (LASIX) 40 MG tablet Take 40 mg by mouth every morning. 1 tablet po prn for SOB or swelling    Historical Dennise Bamber, MD  gabapentin (NEURONTIN) 300 MG capsule Take 300 mg by mouth 3 (three) times daily.      Historical Shell Blanchette, MD  HYDROcodone-acetaminophen (NORCO/VICODIN) 5-325 MG per tablet Take 1-2 tablets by mouth every 4 (four) hours as needed for moderate pain. 08/19/13   Sharyn Creamer, MD  insulin detemir (LEVEMIR) 100 UNIT/ML injection Inject 14 Units into the skin at bedtime.     Historical Brittiney Dicostanzo, MD  labetalol (NORMODYNE) 100 MG tablet Take 300 mg by mouth 2 (two) times daily. Take 3 (100 mg) tablets twice a day.    Historical  Pearley Millington, MD  losartan (COZAAR) 100 MG tablet Take 100 mg by mouth every morning. 07/20/13   Minna Merritts, MD  niacin (NIASPAN) 1000 MG CR tablet Take 2,000 mg by mouth at bedtime.     Historical Sabriyah Wilcher, MD  omega-3 acid ethyl esters (LOVAZA) 1 G capsule Take 1 g  by mouth 2 (two) times daily.     Historical Darrell Hauk, MD  Potassium Gluconate 595 MG CAPS Take 1 capsule by mouth daily.    Historical Shellsea Borunda, MD  rosuvastatin (CRESTOR) 10 MG tablet Take 10 mg by mouth every morning.     Historical Ashvik Grundman, MD  Tamsulosin HCl (FLOMAX) 0.4 MG CAPS Take 0.4 mg by mouth at bedtime.    Historical Tell Rozelle, MD   Triage Vitals: BP 136/74  Pulse 101  Resp 25  Ht 5\' 9"  (1.753 m)  Wt 215 lb (97.523 kg)  BMI 31.74 kg/m2  SpO2 93%  Physical Exam  Nursing note and vitals reviewed. Constitutional: He is oriented to person, place, and time. He appears well-developed. No distress.  HENT:  Head: Normocephalic and atraumatic.  Eyes: Conjunctivae and EOM are normal.  Cardiovascular: Normal rate and regular rhythm.   Pulmonary/Chest: Effort normal. No stridor. No respiratory distress.  Abdominal: He exhibits no distension. There is tenderness.  Lower abdominal tenderness. No peritoneal signs.   Musculoskeletal: He exhibits no edema.  Neurological: He is alert and oriented to person, place, and time.  Skin: Skin is warm and dry.  Psychiatric: He has a normal mood and affect.    ED Course  Procedures (including critical care time)  DIAGNOSTIC STUDIES: Oxygen Saturation is 93% on RA, normal by my interpretation.    COORDINATION OF CARE: 2:07 PM- Pt advised of plan for treatment and pt agrees.  Labs Review Labs Reviewed  CBC WITH DIFFERENTIAL - Abnormal; Notable for the following:    Platelets 131 (*)    Lymphocytes Relative 11 (*)    All other components within normal limits  BASIC METABOLIC PANEL - Abnormal; Notable for the following:    Sodium 136 (*)    Glucose, Bld 140 (*)    GFR  calc non Af Amer 85 (*)    All other components within normal limits  URINALYSIS, ROUTINE W REFLEX MICROSCOPIC - Abnormal; Notable for the following:    Specific Gravity, Urine <1.005 (*)    Hgb urine dipstick TRACE (*)    All other components within normal limits  LACTIC ACID, PLASMA  URINE MICROSCOPIC-ADD ON    Imaging Review Ct Abdomen Pelvis W Contrast  03/14/2014   CLINICAL DATA:  Intermittent fever and lower abdominal pain. History of prostate and bladder cancer.  EXAM: CT ABDOMEN AND PELVIS WITH CONTRAST  TECHNIQUE: Multidetector CT imaging of the abdomen and pelvis was performed using the standard protocol following bolus administration of intravenous contrast.  CONTRAST:  59mL OMNIPAQUE IOHEXOL 300 MG/ML SOLN, 177mL OMNIPAQUE IOHEXOL 300 MG/ML SOLN  COMPARISON:  CT 12/04/2011.  FINDINGS: Bones: Long segment posterior lumbar interbody fusion. Retrolisthesis of L1 on L2. No aggressive osseous lesions.  Lung Bases: Lungs clear. Coronary artery atherosclerosis is present. If office based assessment of coronary risk factors has not been performed, it is now recommended.  Liver: Tiny subcapsular calcified granuloma along the dorsal aspect of the RIGHT hepatic lobe consistent with old granulomatous disease.  Spleen:  Normal.  Gallbladder:  Distended.  No inflammatory changes by CT.  Common bile duct:  Normal.  Pancreas:  Normal.  Adrenal glands:  Normal bilaterally.  Kidneys: 36 mm LEFT inferior pole renal cyst is unchanged. 2 cm RIGHT inferior pole renal cyst is unchanged. Normal enhancement and excretion of contrast. Both ureters are normal.  Stomach:  Distended with oral contrast.  No inflammatory changes.  Small bowel: Normal appearance of the duodenum. There is no bowel obstruction.  No mesenteric adenopathy.  Colon: Appendectomy. Oral contrast has not yet reached the cecum, degrading evaluation of the colon. There appears to be mild mural thickening of the cecum, which can be associated with  enteric infection. Large stool burden is present in the ascending and transverse colon. Colonic diverticulosis. No diverticulitis.  Pelvic Genitourinary: Prostate brachytherapy seeds. Urinary bladder appears normal. No free fluid. No pelvic adenopathy identified.  Vasculature: Atherosclerosis without an acute vascular abnormality.  Body Wall: Normal.  IMPRESSION: 1. Probable thickening of the cecum. Evaluation degraded by lack of oral contrast in the colon. Findings may be associated with enteric infection. Ischemia is considered less likely. Inflammatory bowel disease is also unlikely. Neoplasm could produce this appearance. Consider follow-up colonoscopy if not recently performed. 2. Appendectomy. 3. Prostate brachytherapy seeds. 4. Bilateral renal cysts. 5. Old granulomatous disease. 6. Atherosclerosis and coronary artery disease.   Electronically Signed   By: Dereck Ligas M.D.   On: 03/14/2014 16:48   5:14 PM On re-exam the patient is awake and alert, in no distress.  He states that his pain is better.  I discussed all findings, with his family present. With conservative enteritis the patient was started on antibiotics, followup with GI as well as urology.  MDM    I personally performed the services described in this documentation, which was scribed in my presence. The recorded information has been reviewed and is accurate.   Patient presents with new fever, lower abdominal pain.  On exam patient is awake alert, tachycardic, with lower abdominal pain.  Given the patient's history of malignancy, as well as his fever, pain, CT scan was performed. CT scan suggests enteritis. No evidence for obstruction or sepsis.  The patient started on antibiotics, analgesics and discharged to followup as an outpatient.   Carmin Muskrat, MD 03/14/14 1715

## 2014-03-22 ENCOUNTER — Telehealth: Payer: Self-pay | Admitting: Internal Medicine

## 2014-03-22 NOTE — Telephone Encounter (Signed)
Pt scheduled to see Dr. Olevia Perches 04/06/14@10am . Pt aware. States he was seen in the ER For an infection in his colon and was told to follow-up with GI.

## 2014-03-25 ENCOUNTER — Encounter: Payer: Self-pay | Admitting: *Deleted

## 2014-04-06 ENCOUNTER — Ambulatory Visit (INDEPENDENT_AMBULATORY_CARE_PROVIDER_SITE_OTHER): Payer: Medicare Other | Admitting: Internal Medicine

## 2014-04-06 ENCOUNTER — Encounter: Payer: Self-pay | Admitting: Internal Medicine

## 2014-04-06 VITALS — BP 140/72 | HR 72 | Ht 65.5 in | Wt 214.5 lb

## 2014-04-06 DIAGNOSIS — R933 Abnormal findings on diagnostic imaging of other parts of digestive tract: Secondary | ICD-10-CM

## 2014-04-06 DIAGNOSIS — K559 Vascular disorder of intestine, unspecified: Secondary | ICD-10-CM

## 2014-04-06 MED ORDER — MOVIPREP 100 G PO SOLR: 1.0000 | Freq: Once | ORAL | Status: DC

## 2014-04-06 NOTE — Patient Instructions (Addendum)
You have been given a separate informational sheet regarding your tobacco use, the importance of quitting and local resources to help you quit.  You have been scheduled for a colonoscopy. Please follow written instructions given to you at your visit today.  Please pick up your prep kit at the pharmacy within the next 1-3 days. If you use inhalers (even only as needed), please bring them with you on the day of your procedure. Your physician has requested that you go to www.startemmi.com and enter the access code given to you at your visit today. This web site gives a general overview about your procedure. However, you should still follow specific instructions given to you by our office regarding your preparation for the procedure.  Please continue Plavix for procedure.  CC:Dr [employer]

## 2014-04-06 NOTE — Progress Notes (Signed)
[name] Aug 06, 1946 [id number]  Note: This dictation was prepared with Dragon digital system. Any transcriptional errors that result from this procedure are unintentional.   History of Present Illness:  This is a 68 year old white male  Seen post ED visit for acute lower abdominal pain and fever of 102.3 combined with chills. A CT scan of the abdomen on 03/14/2014 showed mild thickening at the cecum consistent with acute colitis ischemic or infectious. Inflammatory bowel disease or cancer could not be ruled out. He has had chronic constipation. His last colonoscopy in August 2012 showed the polyp which was a leimyeloma. There is a family history of colon cancer in his sister. He had prior colonoscopies in 2001 and 2005. He has completed a course of Cipro 250 twice a day and Flagyl 250 3 times a day. The pain initially got better but is now continuous. It is worse several hours after eating. He denies having any fever. She denies having any blood per rectum. He has chronic low back pain for which he takes hydrocodone. He feels that he is bloated and constipated. He took magnesium citrate one bottle last night and claims very little results.    Past Medical History  Diagnosis Date  . Hyperlipidemia   . Type 2 diabetes mellitus   . History of prostate cancer   . Hypertension   . Coronary artery disease     CARDIOLOGIST-- DR Rockey Situ [name])  . OA (osteoarthritis)   . Cervical fusion syndrome     LEFT ARM NUMBNESS--  CONTROLLED W/ GABAPENTIN  . OSA on CPAP     BiPAP  . DDD (degenerative disc disease), cervical   . Spondylosis, cervical   . DDD (degenerative disc disease), lumbar   . Occlusion and stenosis of carotid artery without mention of cerebral infarction CARDIOLOGIST  -- DR Rockey Situ    DUPLEX  08-05-2012  RICA  27-06%/   LICA 2-37%  . Nocturia   . Urgency of urination   . Bladder cancer     hx turbt in 2004  . Meningitis   . Diverticulosis   . Leiomyoma     colon     Past Surgical History  Procedure Laterality Date  . Cervical fusion  05-11-2005    C3  --  C7  . Septoplasty  1998  . Colonoscopy  04/2011  . Yag laser application  62/83/1517    Procedure: YAG LASER APPLICATION;  Surgeon: Elta Guadeloupe T. Gershon Crane, MD;  Location: [facility];  Service: Ophthalmology;  Laterality: Right;  . Radioactive prostate seed implants  08-19-2003  . Lumbar fusion  03-26-2012    L3 --  L5  . Cataract extraction w/ intraocular lens  implant, bilateral  2013  . Appendectomy  1990'S  . Anterior removal cage and plate c3-c7/ corpectomy c7 (fx)/  allograft and fusion c3 -- t1/  posterior decompression bilateral laminectomy c4 -- 6 & partial c3/  posterolateral arthrodesis c3 - t1  06-05-2005  . Intraoperative arteriogram  CATH LAB  01-05-2009  DR Einar Gip    RICA   ACUTE ANGLE 80-85% STENOSIS  . Endoscopic repair csf leak via nasal passage  2011  . Cystoscopy w/ retrogrades Bilateral 08/19/2013    Procedure: CYSTOSCOPY WITH RETROGRADE PYELOGRAM;  Surgeon: Molli Hazard, MD;  Location: [facility];  Service: Urology;  Laterality: Bilateral;  . Transurethral resection of bladder tumor N/A 08/19/2013    Procedure: TRANSURETHRAL RESECTION OF BLADDER TUMOR (TURBT);  Surgeon: Molli Hazard, MD;  Location: [place];  Service: Urology;  Laterality: N/A;    Allergies  Allergen Reactions  . Dilaudid [Hydromorphone Hcl]   . Toradol [Ketorolac Tromethamine] Other (See Comments)    Causes hallucinations  . Lyrica [Pregabalin] Nausea Only and Other (See Comments)    Make me feel "bad"    Family history and social history have been reviewed.  Review of Systems: Low back pain, constipation.  The remainder of the 10 point ROS is negative except as outlined in the H&P  Physical Exam: General Appearance Well developed, in mild distress due to low back pain. Difficulty and ambulating Eyes  Non icteric  HEENT  Non traumatic, normocephalic   Mouth No lesion, tongue papillated, no cheilosis Neck Supple without adenopathy, thyroid not enlarged, no carotid bruits, no JVD Lungs Clear to auscultation bilaterally COR Normal S1, normal S2, regular rhythm, no murmur, quiet precordium Abdomen protuberant soft with diffuse tenderness. Marked tenderness in right lower quadrant but no rebound. No palpable mass. No CVA tenderness  Rectal soft Hemoccult negative stool Extremities  No pedal edema Skin No lesions Neurological Alert and oriented x 3 Psychological Normal mood and affect  Assessment and Plan:   Problem #28 68 year old, white male with acute colitis localized to the right lower quadrant in the area of  the cecum, documented on a recent CT scan of the abdomen. The pain still continuous and he feels bloated  Due to colonic ileal. It is not clear if this was ischemic colitis, infectious colitis or inflammatory bowel disease. He has completed a course of Flagyl and Cipro for 10 days without any significant improvement. We will go ahead with a colonoscopy as soon as possible. He will stay on Plavix. He will likely need to stay on a liquid diet and bowel rest for some time until colitis has resolved. He will eventually need a bowel regimen for chronic constipation which would include MiraLax and magnesium.   Delfin Edis 04/06/2014

## 2014-04-07 ENCOUNTER — Encounter: Payer: Self-pay | Admitting: Internal Medicine

## 2014-04-07 ENCOUNTER — Ambulatory Visit (AMBULATORY_SURGERY_CENTER): Payer: Medicare Other | Admitting: Internal Medicine

## 2014-04-07 VITALS — BP 143/88 | HR 65 | Resp 37

## 2014-04-07 DIAGNOSIS — K559 Vascular disorder of intestine, unspecified: Secondary | ICD-10-CM

## 2014-04-07 DIAGNOSIS — R933 Abnormal findings on diagnostic imaging of other parts of digestive tract: Secondary | ICD-10-CM

## 2014-04-07 DIAGNOSIS — D126 Benign neoplasm of colon, unspecified: Secondary | ICD-10-CM

## 2014-04-07 LAB — GLUCOSE, CAPILLARY
GLUCOSE-CAPILLARY: 112 mg/dL — AB (ref 70–99)
GLUCOSE-CAPILLARY: 104 mg/dL — AB (ref 70–99)

## 2014-04-07 MED ORDER — POLYETHYLENE GLYCOL 3350 17 GM/SCOOP PO POWD: 17.0000 g | Freq: Every day | ORAL | Status: DC

## 2014-04-07 MED ORDER — DICYCLOMINE HCL 10 MG PO CAPS: 10.0000 mg | ORAL_CAPSULE | Freq: Three times a day (TID) | ORAL | Status: DC

## 2014-04-07 MED ORDER — SODIUM CHLORIDE 0.9 % IV SOLN: 500.0000 mL | INTRAVENOUS | Status: DC

## 2014-04-07 MED ORDER — DICYCLOMINE HCL 10 MG PO CAPS: 10.0000 mg | ORAL_CAPSULE | Freq: Three times a day (TID) | ORAL | Status: DC | PRN

## 2014-04-07 NOTE — Op Note (Signed)
[facility]  [address] [place], [address]   COLONOSCOPY PROCEDURE REPORT  PATIENT: [name], [name]  MR#: [id number] BIRTHDATE: [birth year] , 68  yrs. old GENDER: Male ENDOSCOPIST: Lafayette Dragon, MD REFERRED GB:TDVV Hall, M.D. PROCEDURE DATE:  04/07/2014 PROCEDURE:   Colonoscopy with biopsy First Screening Colonoscopy - Avg.  risk and is 50 yrs.  old or older - No.  Prior Negative Screening - Now for repeat screening. N/A  History of Adenoma - Now for follow-up colonoscopy & has been > or = to 3 yrs.  N/A  Polyps Removed Today? No.  Recommend repeat exam, <10 yrs? Yes.  High risk (family or personal hx). ASA CLASS:   Class III INDICATIONS:thank you Pilar Plate Will quadrant abdominal pain.  CT scan shows inflammatory changes in the cecum.  Family history of colon cancer in a direct relative.  Prior colonoscopies in 2001, 2005 [place] in August 2012.  He had late EOMI EOMI removed from the colon in 2012.  Patient complaining of lower abdominal pain fever. Completed 10 day course of Flagyl and Cipro. MEDICATIONS: MAC sedation, administered by CRNA and propofol (Diprivan) 300mg  IV  DESCRIPTION OF PROCEDURE:   After the risks benefits and alternatives of the procedure were thoroughly explained, informed consent was obtained.  A digital rectal exam revealed no abnormalities of the rectum.   The LB [id number]  endoscope was introduced through the anus and advanced to the cecum, which was identified by both the appendix and ileocecal valve. No adverse events experienced.   The quality of the prep was good, using MoviPrep  The instrument was then slowly withdrawn as the colon was fully examined.      COLON FINDINGS: Two sessile polyps ranging between 3-105mm in size were found at the cecum- 23mm  and in the rectum.- 78mm  A polypectomy was performed with cold forceps.of the rectal polyp. The cecal polyp was biopsied but not removed due to the patient being on  Plavix. Malady on CT scan did not correlate with endoscopic findings. The right colon and the cecum were completely normal as far as the mucosal inflammation is concerned that was moderately severe diverticulosis of the sigmoid colon. There were no diverticula in the right colon  Retroflexed views revealed no abnormalities. The time to cecum=15 minutes 25 seconds.  Withdrawal time=7 minutes 24 seconds.  The scope was withdrawn and the procedure completed. COMPLICATIONS: There were no complications.  ENDOSCOPIC IMPRESSION: Two sessile polyps ranging between 3-40mm in size were found at the cecum and in the rectum; polypectomy was performed with cold forceps of rectal polyp but the cecal polyp was not removed due to pt being on Plavix- biopsies obtained moderate to severe diverticulosis of the sigmoid colon Complete resolution of the inflammatory changes documented on CT scan of the abdomen  RECOMMENDATIONS: 1.  Await pathology results 2.  begin MiraLax 17 g twice a day Bentyl 10 mg twice a day when necessary cramping abdominal pain Recall colonoscopy pending pathology Continue Plavix   eSigned:  Lafayette Dragon, MD 04/07/2014 4:06 PM   cc:   PATIENT NAME:  [name] MR#: [id number]

## 2014-04-07 NOTE — Progress Notes (Signed)
Procedure ends, to recovery, report given and VSS. 

## 2014-04-07 NOTE — Patient Instructions (Signed)
Discharge instructions given with verbal understanding. Handouts on polyps and diverticulosis. Resume previous medications. YOU HAD AN ENDOSCOPIC PROCEDURE TODAY AT THE  ENDOSCOPY CENTER: Refer to the procedure report that was given to you for any specific questions about what was found during the examination.  If the procedure report does not answer your questions, please call your gastroenterologist to clarify.  If you requested that your care partner not be given the details of your procedure findings, then the procedure report has been included in a sealed envelope for you to review at your convenience later.  YOU SHOULD EXPECT: Some feelings of bloating in the abdomen. Passage of more gas than usual.  Walking can help get rid of the air that was put into your GI tract during the procedure and reduce the bloating. If you had a lower endoscopy (such as a colonoscopy or flexible sigmoidoscopy) you may notice spotting of blood in your stool or on the toilet paper. If you underwent a bowel prep for your procedure, then you may not have a normal bowel movement for a few days.  DIET: Your first meal following the procedure should be a light meal and then it is ok to progress to your normal diet.  A half-sandwich or bowl of soup is an example of a good first meal.  Heavy or fried foods are harder to digest and may make you feel nauseous or bloated.  Likewise meals heavy in dairy and vegetables can cause extra gas to form and this can also increase the bloating.  Drink plenty of fluids but you should avoid alcoholic beverages for 24 hours.  ACTIVITY: Your care partner should take you home directly after the procedure.  You should plan to take it easy, moving slowly for the rest of the day.  You can resume normal activity the day after the procedure however you should NOT DRIVE or use heavy machinery for 24 hours (because of the sedation medicines used during the test).    SYMPTOMS TO REPORT  IMMEDIATELY: A gastroenterologist can be reached at any hour.  During normal business hours, 8:30 AM to 5:00 PM Monday through Friday, call [phone].  After hours and on weekends, please call the GI answering service at [phone] who will take a message and have the physician on call contact you.   Following lower endoscopy (colonoscopy or flexible sigmoidoscopy):  Excessive amounts of blood in the stool  Significant tenderness or worsening of abdominal pains  Swelling of the abdomen that is new, acute  Fever of 100F or higher  FOLLOW UP: If any biopsies were taken you will be contacted by phone or by letter within the next 1-3 weeks.  Call your gastroenterologist if you have not heard about the biopsies in 3 weeks.  Our staff will call the home number listed on your records the next business day following your procedure to check on you and address any questions or concerns that you may have at that time regarding the information given to you following your procedure. This is a courtesy call and so if there is no answer at the home number and we have not heard from you through the emergency physician on call, we will assume that you have returned to your regular daily activities without incident.  SIGNATURES/CONFIDENTIALITY: You and/or your care partner have signed paperwork which will be entered into your electronic medical record.  These signatures attest to the fact that that the information above on your After Visit Summary   has been reviewed and is understood.  Full responsibility of the confidentiality of this discharge information lies with you and/or your care-partner. 

## 2014-04-07 NOTE — Progress Notes (Signed)
Called to room to assist during endoscopic procedure.  Patient ID and intended procedure confirmed with present staff. Received instructions for my participation in the procedure from the performing physician.  

## 2014-04-08 ENCOUNTER — Telehealth: Payer: Self-pay

## 2014-04-08 NOTE — Telephone Encounter (Signed)
Left message on answering machine. 

## 2014-04-13 ENCOUNTER — Encounter: Payer: Self-pay | Admitting: Internal Medicine

## 2014-05-12 ENCOUNTER — Ambulatory Visit: Payer: Medicare Other | Admitting: Cardiovascular Disease

## 2014-05-12 ENCOUNTER — Encounter: Payer: Self-pay | Admitting: *Deleted

## 2014-05-13 ENCOUNTER — Telehealth: Payer: Self-pay | Admitting: *Deleted

## 2014-05-13 NOTE — Telephone Encounter (Signed)
Lmom to sched an appt w/ Dr.Gollan and Carotid u/s.

## 2014-05-26 ENCOUNTER — Other Ambulatory Visit: Payer: Self-pay | Admitting: Cardiovascular Disease

## 2014-05-28 ENCOUNTER — Ambulatory Visit: Payer: Medicare Other | Admitting: Cardiovascular Disease

## 2014-06-03 ENCOUNTER — Encounter: Payer: Self-pay | Admitting: Cardiovascular Disease

## 2014-06-03 ENCOUNTER — Ambulatory Visit (INDEPENDENT_AMBULATORY_CARE_PROVIDER_SITE_OTHER): Payer: Medicare Other | Admitting: Cardiovascular Disease

## 2014-06-03 ENCOUNTER — Encounter (INDEPENDENT_AMBULATORY_CARE_PROVIDER_SITE_OTHER): Payer: Medicare Other

## 2014-06-03 VITALS — BP 136/77 | HR 77 | Ht 69.0 in | Wt 212.0 lb

## 2014-06-03 DIAGNOSIS — I6529 Occlusion and stenosis of unspecified carotid artery: Secondary | ICD-10-CM

## 2014-06-03 DIAGNOSIS — I6521 Occlusion and stenosis of right carotid artery: Secondary | ICD-10-CM

## 2014-06-03 DIAGNOSIS — Z720 Tobacco use: Secondary | ICD-10-CM

## 2014-06-03 DIAGNOSIS — F17200 Nicotine dependence, unspecified, uncomplicated: Secondary | ICD-10-CM

## 2014-06-03 DIAGNOSIS — I10 Essential (primary) hypertension: Secondary | ICD-10-CM

## 2014-06-03 DIAGNOSIS — E1159 Type 2 diabetes mellitus with other circulatory complications: Secondary | ICD-10-CM

## 2014-06-03 DIAGNOSIS — I2510 Atherosclerotic heart disease of native coronary artery without angina pectoris: Secondary | ICD-10-CM

## 2014-06-03 DIAGNOSIS — E785 Hyperlipidemia, unspecified: Secondary | ICD-10-CM

## 2014-06-03 DIAGNOSIS — I739 Peripheral vascular disease, unspecified: Secondary | ICD-10-CM

## 2014-06-03 NOTE — Assessment & Plan Note (Signed)
Goal LDL less than 70. Most recent lipid panel not available

## 2014-06-03 NOTE — Assessment & Plan Note (Signed)
Blood pressure is well controlled on today's visit. No changes made to the medications. 

## 2014-06-03 NOTE — Assessment & Plan Note (Signed)
Stable 60-79% disease on the right. We'll repeat in 6 months time. Encouraged him to stop smoking

## 2014-06-03 NOTE — Assessment & Plan Note (Signed)
We have encouraged continued exercise, careful diet management in an effort to lose weight. 

## 2014-06-03 NOTE — Progress Notes (Signed)
Patient ID: [name], male    DOB: [birth year], 68 y.o.   MRN: [id number]  HPI Comments: 68 year old male with a history of obstructive sleep apnea, hypertension, peripheral vascular disease with severe right internal carotid arterial stenosis estimated at 60-70%, diabetes, hypertension, hyperlipidemia, obesity, history of cervical spine surgery and prostate cancer with treatment who presents for routine followup. He has a long history of smoking and continues to smoke.  In followup today, he continues to have problems with his back pain. He has not been able to get into see his neurosurgeon in [place]. He does not like taking the pain medication as it has side effects He has started smoking again, one pack per day. He is going to restart Chantix in an effort to quit.  He had a carotid ultrasound. Prelim Shows 60-70% disease on the right which is stable from prior study He is tolerating Crestor 10 mg daily  CT scan of the chest July 2013 showed underlying coronary artery disease.  EKG shows normal sinus rhythm with rate of 77 beats per minute, no significant ST or T wave changes  Outpatient Encounter Prescriptions as of 06/03/2014  Medication Sig  . allopurinol (ZYLOPRIM) 300 MG tablet Take 300 mg by mouth at bedtime.   Marland Kitchen amLODipine (NORVASC) 10 MG tablet Take 10 mg by mouth daily.  Marland Kitchen aspirin 81 MG EC tablet Take 81 mg by mouth at bedtime.   . celecoxib (CELEBREX) 200 MG capsule Take 200 mg by mouth every morning.  . cholecalciferol (VITAMIN D) 1000 UNITS tablet Take 1,000 Units by mouth daily.  . clopidogrel (PLAVIX) 75 MG tablet Take 75 mg by mouth daily with breakfast.   . colchicine 0.6 MG tablet Take 0.6 mg by mouth daily as needed. For gout  . dutasteride (AVODART) 0.5 MG capsule Take 0.5 mg by mouth daily.  . furosemide (LASIX) 40 MG tablet Take 40 mg by mouth daily.   Marland Kitchen gabapentin (NEURONTIN) 300 MG capsule Take 300 mg by mouth 3 (three) times daily.    Marland Kitchen ibuprofen  (ADVIL,MOTRIN) 200 MG tablet Take 800 mg by mouth every 8 (eight) hours as needed for moderate pain.  Marland Kitchen insulin detemir (LEVEMIR) 100 UNIT/ML injection Inject 14 Units into the skin at bedtime.   Marland Kitchen labetalol (NORMODYNE) 100 MG tablet Take 300 mg by mouth 2 (two) times daily. Take 3 (100 mg) tablets twice a day.  . losartan (COZAAR) 100 MG tablet TAKE 1 TABLET BY MOUTH EVERY DAY  . niacin (NIASPAN) 1000 MG CR tablet Take 2,000 mg by mouth at bedtime.   Marland Kitchen omega-3 acid ethyl esters (LOVAZA) 1 G capsule Take 1 g by mouth 2 (two) times daily.   . polyethylene glycol powder (GLYCOLAX/MIRALAX) powder Take 17 g by mouth daily.  . Potassium Gluconate 595 MG CAPS Take 1 capsule by mouth daily.  . rosuvastatin (CRESTOR) 10 MG tablet Take 10 mg by mouth every morning.   . Tamsulosin HCl (FLOMAX) 0.4 MG CAPS Take 0.4 mg by mouth at bedtime.  . finasteride (PROSCAR) 5 MG tablet Take 5 mg by mouth at bedtime.   Review of Systems  Constitutional: Negative.   HENT: Negative.   Eyes: Negative.   Respiratory: Negative.   Cardiovascular: Negative.   Gastrointestinal: Negative.   Endocrine: Negative.   Musculoskeletal: Positive for back pain and gait problem.  Skin: Negative.   Allergic/Immunologic: Negative.   Neurological: Negative.   Hematological: Negative.   Psychiatric/Behavioral: Negative.   All other systems reviewed  and are negative.  BP 136/77  Pulse 77  Ht 5\' 9"  (1.753 m)  Wt 212 lb (96.163 kg)  BMI 31.29 kg/m2  Physical Exam  Nursing note and vitals reviewed. Constitutional: He is oriented to person, place, and time. He appears well-developed and well-nourished.  Obese  HENT:  Head: Normocephalic.  Right Ear: External ear normal.  Left Ear: External ear normal.  Nose: Nose normal.  Mouth/Throat: Oropharynx is clear and moist.  Eyes: Conjunctivae are normal. Pupils are equal, round, and reactive to light. Right eye exhibits no discharge. No scleral icterus.  Neck: Normal range of  motion. Neck supple. No JVD present. Carotid bruit is present.  Cardiovascular: Normal rate, regular rhythm, S1 normal, S2 normal and intact distal pulses.  Exam reveals no gallop and no friction rub.   Murmur heard.  Crescendo systolic murmur is present with a grade of 2/6  Pulmonary/Chest: Effort normal and breath sounds normal. No respiratory distress. He has no wheezes. He has no rales. He exhibits no tenderness.  Abdominal: Soft. Bowel sounds are normal. He exhibits no distension. There is no tenderness.  Musculoskeletal: Normal range of motion. He exhibits no edema and no tenderness.  Lymphadenopathy:    He has no cervical adenopathy.  Neurological: He is alert and oriented to person, place, and time. Coordination normal.  Skin: Skin is warm and dry. No rash noted. No erythema.  Psychiatric: He has a normal mood and affect. His behavior is normal. Judgment and thought content normal.      Assessment and Plan

## 2014-06-03 NOTE — Assessment & Plan Note (Signed)
We have encouraged him to continue to work on weaning his cigarettes and smoking cessation. He will continue to work on this and does not want any assistance with chantix.  

## 2014-06-03 NOTE — Assessment & Plan Note (Signed)
Currently with no symptoms of angina. No further workup at this time. Continue current medication regimen. 

## 2014-06-03 NOTE — Patient Instructions (Signed)
You are doing well. No medication changes were made.  Please call us if you have new issues that need to be addressed before your next appt.  Your physician wants you to follow-up in: 6 months.  You will receive a reminder letter in the mail two months in advance. If you don't receive a letter, please call our office to schedule the follow-up appointment.   

## 2014-06-29 ENCOUNTER — Other Ambulatory Visit: Payer: Self-pay | Admitting: Cardiovascular Disease

## 2014-08-27 ENCOUNTER — Other Ambulatory Visit (HOSPITAL_COMMUNITY): Payer: Self-pay | Admitting: Internal Medicine

## 2014-08-27 DIAGNOSIS — M545 Low back pain: Secondary | ICD-10-CM

## 2014-09-01 ENCOUNTER — Other Ambulatory Visit (HOSPITAL_COMMUNITY): Payer: Self-pay | Admitting: Internal Medicine

## 2014-09-01 ENCOUNTER — Ambulatory Visit (HOSPITAL_COMMUNITY)
Admission: RE | Admit: 2014-09-01 | Discharge: 2014-09-01 | Disposition: A | Discharge status: Home / Self Care | Payer: Medicare Other | Source: Ambulatory Visit | Attending: Internal Medicine | Admitting: Internal Medicine

## 2014-09-01 DIAGNOSIS — Z8551 Personal history of malignant neoplasm of bladder: Secondary | ICD-10-CM | POA: Insufficient documentation

## 2014-09-01 DIAGNOSIS — Z981 Arthrodesis status: Secondary | ICD-10-CM | POA: Insufficient documentation

## 2014-09-01 DIAGNOSIS — M5126 Other intervertebral disc displacement, lumbar region: Secondary | ICD-10-CM | POA: Diagnosis not present

## 2014-09-01 DIAGNOSIS — M160 Bilateral primary osteoarthritis of hip: Secondary | ICD-10-CM | POA: Insufficient documentation

## 2014-09-01 DIAGNOSIS — M545 Low back pain: Secondary | ICD-10-CM | POA: Diagnosis present

## 2014-09-01 DIAGNOSIS — M25552 Pain in left hip: Secondary | ICD-10-CM | POA: Insufficient documentation

## 2014-09-01 DIAGNOSIS — M4806 Spinal stenosis, lumbar region: Secondary | ICD-10-CM | POA: Diagnosis not present

## 2014-09-01 LAB — POCT I-STAT CREATININE: CREATININE: 1.2 mg/dL (ref 0.50–1.35)

## 2014-09-01 MED ORDER — GADOBENATE DIMEGLUMINE 529 MG/ML IV SOLN
20.0000 mL | Freq: Once | INTRAVENOUS | Status: AC | PRN
  Administered 2014-09-01: 20 mL via INTRAVENOUS

## 2014-10-04 ENCOUNTER — Other Ambulatory Visit: Payer: Self-pay | Admitting: Internal Medicine

## 2014-10-04 ENCOUNTER — Other Ambulatory Visit: Payer: Self-pay | Admitting: Cardiovascular Disease

## 2014-10-29 ENCOUNTER — Telehealth: Payer: Self-pay | Admitting: *Deleted

## 2014-10-29 NOTE — Telephone Encounter (Signed)
Please have patient schedule Lexiscan for pre-op eval.

## 2014-10-29 NOTE — Telephone Encounter (Signed)
Pt sched to see Dr. Rockey Situ 11/01/14 @ 11:15, as he is due for 6 mo f/u.

## 2014-10-29 NOTE — Telephone Encounter (Signed)
Request for surgical clearance:  1. What type of surgery is being performed? Back surgery   2. When is this surgery scheduled? Yes its 2/29   3. Are there any medications that need to be held prior to surgery and how long? May need to hold Plavix and Asprin   4. Name of physician performing surgery? Dr Carloyn Manner in [place] [facility])   5. What is your office phone and fax number? [phone] and fax number is [phone]

## 2014-11-01 ENCOUNTER — Ambulatory Visit (INDEPENDENT_AMBULATORY_CARE_PROVIDER_SITE_OTHER): Payer: Medicare Other | Admitting: Cardiovascular Disease

## 2014-11-01 ENCOUNTER — Encounter: Payer: Self-pay | Admitting: Cardiovascular Disease

## 2014-11-01 ENCOUNTER — Other Ambulatory Visit: Payer: Self-pay | Admitting: Cardiovascular Disease

## 2014-11-01 VITALS — BP 140/72 | HR 73 | Ht 69.0 in | Wt 221.2 lb

## 2014-11-01 DIAGNOSIS — Z720 Tobacco use: Secondary | ICD-10-CM

## 2014-11-01 DIAGNOSIS — I6521 Occlusion and stenosis of right carotid artery: Secondary | ICD-10-CM

## 2014-11-01 DIAGNOSIS — I2510 Atherosclerotic heart disease of native coronary artery without angina pectoris: Secondary | ICD-10-CM

## 2014-11-01 DIAGNOSIS — I739 Peripheral vascular disease, unspecified: Secondary | ICD-10-CM

## 2014-11-01 DIAGNOSIS — F17200 Nicotine dependence, unspecified, uncomplicated: Secondary | ICD-10-CM

## 2014-11-01 DIAGNOSIS — I10 Essential (primary) hypertension: Secondary | ICD-10-CM

## 2014-11-01 DIAGNOSIS — Z01810 Encounter for preprocedural cardiovascular examination: Secondary | ICD-10-CM

## 2014-11-01 NOTE — Assessment & Plan Note (Signed)
Blood pressure is well controlled on today's visit. No changes made to the medications. 

## 2014-11-01 NOTE — Progress Notes (Signed)
Patient ID: [name], male    DOB: 12-07-1945, 69 y.o.   MRN: [id number]  HPI Comments: 69 year-old male with a history of obstructive sleep apnea, hypertension, peripheral vascular disease with severe right internal carotid arterial stenosis estimated at 60-70%, diabetes, hypertension, hyperlipidemia, obesity, history of cervical spine surgery and prostate cancer with treatment who presents for routine followup  of his PAD. He has a long history of smoking and continues to smoke. He stopped 2 days ago  In follow today, he has back surgery scheduled with Dr. Christy Sartorius at the end of February 2016. Notes indicate he has a right L1 to L2 MASTLIF scheduled Fabry 29th 2016 with subsequent left-sided pedicle screw fixation He is very desperate to have this fixed as he has stabbing back pain 24 hours per day. He denies any significant chest pain or shortness of breath with exertion. He has never had angina in the past. No recent cardiac catheterization. Carotid disease has been relatively stable.  EKG on today's visit shows normal sinus rhythm with rate 73 bpm, no significant ST or T-wave changes  Other past medical history Long history of smoking, tried Chantix in the past, stopped smoking 2 days ago per the patient  carotid ultrasound. Showing 60-70% disease on the right which is stable from prior study He is tolerating Crestor 10 mg daily  CT scan of the chest July 2013 showed underlying coronary artery disease.  Allergies  Allergen Reactions  . Dilaudid [Hydromorphone Hcl]   . Toradol [Ketorolac Tromethamine] Other (See Comments)    Causes hallucinations  . Lyrica [Pregabalin] Nausea Only and Other (See Comments)    Make me feel "bad"    Outpatient Encounter Prescriptions as of 11/01/2014  Medication Sig  . allopurinol (ZYLOPRIM) 300 MG tablet Take 300 mg by mouth at bedtime.   Marland Kitchen amLODipine (NORVASC) 10 MG tablet Take 10 mg by mouth daily.  Marland Kitchen aspirin 81 MG EC tablet Take 81 mg by mouth  at bedtime.   . celecoxib (CELEBREX) 200 MG capsule Take 200 mg by mouth every morning.  . cholecalciferol (VITAMIN D) 1000 UNITS tablet Take 1,000 Units by mouth daily.  . clopidogrel (PLAVIX) 75 MG tablet Take 75 mg by mouth daily with breakfast.   . colchicine 0.6 MG tablet Take 0.6 mg by mouth daily as needed. For gout  . dutasteride (AVODART) 0.5 MG capsule Take 0.5 mg by mouth daily.  . finasteride (PROSCAR) 5 MG tablet Take 5 mg by mouth at bedtime.  . furosemide (LASIX) 40 MG tablet Take 40 mg by mouth daily.   Marland Kitchen gabapentin (NEURONTIN) 300 MG capsule Take 300 mg by mouth 3 (three) times daily.    Marland Kitchen ibuprofen (ADVIL,MOTRIN) 200 MG tablet Take 800 mg by mouth every 8 (eight) hours as needed for moderate pain.  Marland Kitchen insulin detemir (LEVEMIR) 100 UNIT/ML injection Inject 14 Units into the skin at bedtime.   Marland Kitchen labetalol (NORMODYNE) 100 MG tablet Take 300 mg by mouth 2 (two) times daily. Take 3 (100 mg) tablets twice a day.  . losartan (COZAAR) 100 MG tablet TAKE 1 TABLET BY MOUTH DAILY  . niacin (NIASPAN) 1000 MG CR tablet Take 2,000 mg by mouth at bedtime.   Marland Kitchen omega-3 acid ethyl esters (LOVAZA) 1 G capsule Take 1 g by mouth 2 (two) times daily.   . polyethylene glycol powder (GLYCOLAX/MIRALAX) powder TAKE 17GM BY MOUTH DAILY  . Potassium Gluconate 595 MG CAPS Take 1 capsule by mouth daily.  Marland Kitchen  rosuvastatin (CRESTOR) 10 MG tablet Take 10 mg by mouth every morning.   . Tamsulosin HCl (FLOMAX) 0.4 MG CAPS Take 0.4 mg by mouth at bedtime.  . [DISCONTINUED] amLODipine (NORVASC) 10 MG tablet TAKE 1 TABLET BY MOUTH EVERY DAY (Patient not taking: Reported on 11/01/2014)    Past Medical History  Diagnosis Date  . Hyperlipidemia   . Type 2 diabetes mellitus   . History of prostate cancer   . Hypertension   . Coronary artery disease     CARDIOLOGIST-- DR Rockey Situ [name])  . OA (osteoarthritis)   . Cervical fusion syndrome     LEFT ARM NUMBNESS--  CONTROLLED W/ GABAPENTIN  . OSA on  CPAP     BiPAP  . DDD (degenerative disc disease), cervical   . Spondylosis, cervical   . DDD (degenerative disc disease), lumbar   . Occlusion and stenosis of carotid artery without mention of cerebral infarction CARDIOLOGIST  -- DR Rockey Situ    DUPLEX  08-05-2012  RICA  67-34%/   LICA 1-93%  . Nocturia   . Urgency of urination   . Bladder cancer     hx turbt in 2004  . Meningitis   . Diverticulosis   . Leiomyoma     colon    Past Surgical History  Procedure Laterality Date  . Cervical fusion  05-11-2005    C3  --  C7  . Septoplasty  1998  . Colonoscopy  04/2011  . Yag laser application  79/10/4095    Procedure: YAG LASER APPLICATION;  Surgeon: Elta Guadeloupe T. Gershon Crane, MD;  Location: [facility];  Service: Ophthalmology;  Laterality: Right;  . Radioactive prostate seed implants  08-19-2003  . Lumbar fusion  03-26-2012    L3 --  L5  . Cataract extraction w/ intraocular lens  implant, bilateral  2013  . Appendectomy  1990'S  . Anterior removal cage and plate c3-c7/ corpectomy c7 (fx)/  allograft and fusion c3 -- t1/  posterior decompression bilateral laminectomy c4 -- 6 & partial c3/  posterolateral arthrodesis c3 - t1  06-05-2005  . Intraoperative arteriogram  CATH LAB  01-05-2009  DR Einar Gip    RICA   ACUTE ANGLE 80-85% STENOSIS  . Endoscopic repair csf leak via nasal passage  2011  . Cystoscopy w/ retrogrades Bilateral 08/19/2013    Procedure: CYSTOSCOPY WITH RETROGRADE PYELOGRAM;  Surgeon: Molli Hazard, MD;  Location: [facility];  Service: Urology;  Laterality: Bilateral;  . Transurethral resection of bladder tumor N/A 08/19/2013    Procedure: TRANSURETHRAL RESECTION OF BLADDER TUMOR (TURBT);  Surgeon: Molli Hazard, MD;  Location: [facility];  Service: Urology;  Laterality: N/A;    Social History  reports that he quit smoking 2 days ago. His smoking use included Cigarettes. He has a 40 pack-year smoking history. He has never used smokeless  tobacco. He reports that he does not drink alcohol or use illicit drugs.  Family History family history includes Arthritis in his other; Breast cancer in his mother; Cancer in his sister; Colon cancer in his brother; Diabetes in his brother, mother, and sister; Heart disease in his father.       Review of Systems  Constitutional: Negative.   Respiratory: Negative.   Cardiovascular: Negative.   Gastrointestinal: Negative.   Musculoskeletal: Positive for back pain and gait problem.  Skin: Negative.   Neurological: Negative.   Hematological: Negative.   Psychiatric/Behavioral: Negative.   All other systems reviewed and are negative.  BP  140/72 mmHg  Pulse 73  Ht 5\' 9"  (1.753 m)  Wt 221 lb 4 oz (100.358 kg)  BMI 32.66 kg/m2  Physical Exam  Constitutional: He is oriented to person, place, and time. He appears well-developed and well-nourished.  Obese  HENT:  Head: Normocephalic.  Right Ear: External ear normal.  Left Ear: External ear normal.  Nose: Nose normal.  Mouth/Throat: Oropharynx is clear and moist.  Eyes: Conjunctivae are normal. Pupils are equal, round, and reactive to light. Right eye exhibits no discharge. No scleral icterus.  Neck: Normal range of motion. Neck supple. No JVD present. Carotid bruit is present.  Cardiovascular: Normal rate, regular rhythm, S1 normal, S2 normal and intact distal pulses.  Exam reveals no gallop and no friction rub.   Murmur heard.  Crescendo systolic murmur is present with a grade of 2/6  Pulmonary/Chest: Effort normal and breath sounds normal. No respiratory distress. He has no wheezes. He has no rales. He exhibits no tenderness.  Abdominal: Soft. Bowel sounds are normal. He exhibits no distension. There is no tenderness.  Musculoskeletal: Normal range of motion. He exhibits no edema or tenderness.  Lymphadenopathy:    He has no cervical adenopathy.  Neurological: He is alert and oriented to person, place, and time. Coordination  normal.  Skin: Skin is warm and dry. No rash noted. No erythema.  Psychiatric: He has a normal mood and affect. His behavior is normal. Judgment and thought content normal.      Assessment and Plan   Nursing note and vitals reviewed.

## 2014-11-01 NOTE — Assessment & Plan Note (Signed)
Currently with no symptoms of angina. No further workup at this time. Continue current medication regimen. 

## 2014-11-01 NOTE — Assessment & Plan Note (Signed)
Routine annual surveillance of carotid arteries, stable Recommended smoking cessation, aggressive cholesterol control

## 2014-11-01 NOTE — Patient Instructions (Addendum)
You are doing well. No medication changes were made.  Ok to stop the plavix before the surgery  Please call us if you have new issues that need to be addressed before your next appt.  Your physician wants you to follow-up in: 6 months.  You will receive a reminder letter in the mail two months in advance. If you don't receive a letter, please call our office to schedule the follow-up appointment.

## 2014-11-01 NOTE — Assessment & Plan Note (Signed)
He would be acceptable risk for upcoming surgery on his back with Dr. Carloyn Manner. No recent symptoms concerning for angina. EKG essentially normal. Would recommend minimizing IV fluids as he would be high risk of diastolic CHF and arrhythmia in the perioperative period.

## 2014-11-01 NOTE — Assessment & Plan Note (Signed)
We have encouraged him to continue to work on weaning his cigarettes and smoking cessation. He will continue to work on this and does not want any assistance with chantix.  

## 2014-11-01 NOTE — Assessment & Plan Note (Signed)
Stable disease on the right. Repeat carotid later in 2016 on an annual basis

## 2014-11-02 ENCOUNTER — Telehealth: Payer: Self-pay | Admitting: *Deleted

## 2014-11-02 NOTE — Telephone Encounter (Signed)
Faxed Dr. Donivan Scull office note and clearance letter to Dr. Rex Kras attention.

## 2014-11-02 NOTE — Telephone Encounter (Signed)
[facility] asking for [place] to send a letter of clearance so patient can have surgery, pt came yesterday.  Please fax to:  [phone]

## 2015-01-05 ENCOUNTER — Other Ambulatory Visit: Payer: Self-pay | Admitting: Internal Medicine

## 2015-02-23 ENCOUNTER — Telehealth: Payer: Self-pay | Admitting: *Deleted

## 2015-02-23 NOTE — Telephone Encounter (Signed)
Lmom to call our office. Time to schedule a carotid doppler (6 mth f/u).

## 2015-03-01 ENCOUNTER — Other Ambulatory Visit: Payer: Self-pay | Admitting: Cardiovascular Disease

## 2015-03-04 ENCOUNTER — Encounter: Payer: Self-pay | Admitting: Gastroenterology

## 2015-04-27 ENCOUNTER — Ambulatory Visit (INDEPENDENT_AMBULATORY_CARE_PROVIDER_SITE_OTHER): Payer: Medicare Other | Admitting: Cardiovascular Disease

## 2015-04-27 ENCOUNTER — Encounter: Payer: Self-pay | Admitting: Cardiovascular Disease

## 2015-04-27 VITALS — BP 128/66 | HR 73 | Ht 69.0 in | Wt 224.8 lb

## 2015-04-27 DIAGNOSIS — Z720 Tobacco use: Secondary | ICD-10-CM

## 2015-04-27 DIAGNOSIS — I10 Essential (primary) hypertension: Secondary | ICD-10-CM | POA: Diagnosis not present

## 2015-04-27 DIAGNOSIS — E785 Hyperlipidemia, unspecified: Secondary | ICD-10-CM

## 2015-04-27 DIAGNOSIS — I2510 Atherosclerotic heart disease of native coronary artery without angina pectoris: Secondary | ICD-10-CM | POA: Diagnosis not present

## 2015-04-27 DIAGNOSIS — I6521 Occlusion and stenosis of right carotid artery: Secondary | ICD-10-CM

## 2015-04-27 DIAGNOSIS — E1159 Type 2 diabetes mellitus with other circulatory complications: Secondary | ICD-10-CM

## 2015-04-27 DIAGNOSIS — F17200 Nicotine dependence, unspecified, uncomplicated: Secondary | ICD-10-CM

## 2015-04-27 NOTE — Assessment & Plan Note (Signed)
We have encouraged him to continue to work on weaning his cigarettes and smoking cessation. He will continue to work on this and does not want any assistance with chantix.  

## 2015-04-27 NOTE — Patient Instructions (Signed)
You are doing well. No medication changes were made.  We will try to obtain your old labs for out records  We will schedule a carotid u/s in [place]  Please call us if you have new issues that need to be addressed before your next appt.  Your physician wants you to follow-up in: 6 months in [place] will receive a reminder letter in the mail two months in advance. If you don't receive a letter, please call our office to schedule the follow-up appointment.

## 2015-04-27 NOTE — Assessment & Plan Note (Signed)
Poorly controlled diabetes, likely diet indiscretion. Recommended a strict diet

## 2015-04-27 NOTE — Assessment & Plan Note (Signed)
Most recent lipid panel not available. Goal LDL less than 70. 

## 2015-04-27 NOTE — Progress Notes (Signed)
Patient ID: [name], male    DOB: [birth year], 68 y.o.   MRN: [id number]  HPI Comments: 69 year-old male with a history of obstructive sleep apnea, hypertension, peripheral vascular disease with severe right internal carotid arterial stenosis estimated at 60-70%, diabetes, hypertension, hyperlipidemia, obesity, history of cervical spine surgery and prostate cancer with treatment who presents for routine followup  of his PAD. He has a long history of smoking  He reports that he stop smoking 3 months ago Reports his sugar levels are up despite increasing his insulin from 14 units up to 25 units Thinks he needs to cut back on his food Missed his carotid appointment for 6 month follow-up  Does have a history of urinary tract infection May 2015 Neck surgery September 2007 Low Back surgery July 2013 and repeat in February 2016  Continues to be limited secondary to back issues. Very deconditioned, no regular exercise program  EKG on today's visit shows normal sinus rhythm with rate 73 bpm, no significant ST or T-wave changes  Other past medical history Long history of smoking, tried Chantix in the past, stopped smoking 2 days ago per the patient  carotid ultrasound. Showing 60-70% disease on the right which is stable from prior study He is tolerating Crestor 10 mg daily  CT scan of the chest July 2013 showed underlying coronary artery disease.  Allergies  Allergen Reactions  . Dilaudid [Hydromorphone Hcl]   . Toradol [Ketorolac Tromethamine] Other (See Comments)    Causes hallucinations  . Lyrica [Pregabalin] Nausea Only and Other (See Comments)    Make me feel "bad"    Outpatient Encounter Prescriptions as of 04/27/2015  Medication Sig  . allopurinol (ZYLOPRIM) 300 MG tablet Take 300 mg by mouth at bedtime.   Marland Kitchen amLODipine (NORVASC) 10 MG tablet Take 10 mg by mouth daily.  Marland Kitchen aspirin 81 MG EC tablet Take 81 mg by mouth at bedtime.   . celecoxib (CELEBREX) 200 MG capsule Take 200 mg  by mouth every morning.  . cholecalciferol (VITAMIN D) 1000 UNITS tablet Take 1,000 Units by mouth daily.  . clopidogrel (PLAVIX) 75 MG tablet Take 75 mg by mouth daily with breakfast.   . colchicine 0.6 MG tablet Take 0.6 mg by mouth daily as needed. For gout  . docusate sodium (COLACE) 100 MG capsule Take 200 mg by mouth daily.  Marland Kitchen dutasteride (AVODART) 0.5 MG capsule Take 0.5 mg by mouth daily.  . finasteride (PROSCAR) 5 MG tablet Take 5 mg by mouth at bedtime.  . furosemide (LASIX) 40 MG tablet Take 40 mg by mouth daily.   Marland Kitchen gabapentin (NEURONTIN) 300 MG capsule Take 300 mg by mouth 3 (three) times daily.    Marland Kitchen HYDROcodone-acetaminophen (NORCO/VICODIN) 5-325 MG per tablet Take 1 tablet by mouth every 6 (six) hours as needed for moderate pain.  Marland Kitchen ibuprofen (ADVIL,MOTRIN) 200 MG tablet Take 800 mg by mouth every 8 (eight) hours as needed for moderate pain.  Marland Kitchen insulin detemir (LEVEMIR) 100 UNIT/ML injection Inject 14 Units into the skin at bedtime.   Marland Kitchen labetalol (NORMODYNE) 100 MG tablet Take 300 mg by mouth 2 (two) times daily. Take 3 (100 mg) tablets twice a day.  . losartan (COZAAR) 100 MG tablet TAKE 1 TABLET BY MOUTH EVERY DAY  . niacin (NIASPAN) 1000 MG CR tablet Take 2,000 mg by mouth at bedtime.   Marland Kitchen omega-3 acid ethyl esters (LOVAZA) 1 G capsule Take 1 g by mouth 2 (two) times daily.   Marland Kitchen  polyethylene glycol powder (GLYCOLAX/MIRALAX) powder TAKE 17GM(1 CAPFUL) BY MOUTH(MIXED WITH WATER/JUICE) DAILY  . Potassium Gluconate 595 MG CAPS Take 1 capsule by mouth daily.  . rosuvastatin (CRESTOR) 10 MG tablet Take 10 mg by mouth every morning.   . Tamsulosin HCl (FLOMAX) 0.4 MG CAPS Take 0.4 mg by mouth at bedtime.  Marland Kitchen tiZANidine (ZANAFLEX) 4 MG tablet Take 4 mg by mouth every 6 (six) hours as needed for muscle spasms.   No facility-administered encounter medications on file as of 04/27/2015.    Past Medical History  Diagnosis Date  . Hyperlipidemia   . Type 2 diabetes mellitus   . History  of prostate cancer   . Hypertension   . Coronary artery disease     CARDIOLOGIST-- DR Rockey Situ [name])  . OA (osteoarthritis)   . Cervical fusion syndrome     LEFT ARM NUMBNESS--  CONTROLLED W/ GABAPENTIN  . OSA on CPAP     BiPAP  . DDD (degenerative disc disease), cervical   . Spondylosis, cervical   . DDD (degenerative disc disease), lumbar   . Occlusion and stenosis of carotid artery without mention of cerebral infarction CARDIOLOGIST  -- DR Rockey Situ    DUPLEX  08-05-2012  RICA  46-96%/   LICA 2-95%  . Nocturia   . Urgency of urination   . Bladder cancer     hx turbt in 2004  . Meningitis   . Diverticulosis   . Leiomyoma     colon    Past Surgical History  Procedure Laterality Date  . Cervical fusion  05-11-2005    C3  --  C7  . Septoplasty  1998  . Colonoscopy  04/2011  . Yag laser application  28/41/3244    Procedure: YAG LASER APPLICATION;  Surgeon: Elta Guadeloupe T. Gershon Crane, MD;  Location: [facility];  Service: Ophthalmology;  Laterality: Right;  . Radioactive prostate seed implants  08-19-2003  . Lumbar fusion  03-26-2012    L3 --  L5  . Cataract extraction w/ intraocular lens  implant, bilateral  2013  . Appendectomy  1990'S  . Anterior removal cage and plate c3-c7/ corpectomy c7 (fx)/  allograft and fusion c3 -- t1/  posterior decompression bilateral laminectomy c4 -- 6 & partial c3/  posterolateral arthrodesis c3 - t1  06-05-2005  . Intraoperative arteriogram  CATH LAB  01-05-2009  DR Einar Gip    RICA   ACUTE ANGLE 80-85% STENOSIS  . Endoscopic repair csf leak via nasal passage  2011  . Cystoscopy w/ retrogrades Bilateral 08/19/2013    Procedure: CYSTOSCOPY WITH RETROGRADE PYELOGRAM;  Surgeon: Molli Hazard, MD;  Location: [facility];  Service: Urology;  Laterality: Bilateral;  . Transurethral resection of bladder tumor N/A 08/19/2013    Procedure: TRANSURETHRAL RESECTION OF BLADDER TUMOR (TURBT);  Surgeon: Molli Hazard, MD;  [facility];  Service: Urology;  Laterality: N/A;    Social History  reports that he quit smoking about 5 months ago. His smoking use included Cigarettes. He has a 40 pack-year smoking history. He has never used smokeless tobacco. He reports that he does not drink alcohol or use illicit drugs.  Family History family history includes Arthritis in his other; Breast cancer in his mother; Cancer in his sister; Colon cancer in his brother; Diabetes in his brother, mother, and sister; Heart disease in his father.       Review of Systems  Constitutional: Negative.   Respiratory: Negative.   Cardiovascular: Negative.  Gastrointestinal: Negative.   Musculoskeletal: Positive for back pain and gait problem.  Skin: Negative.   Neurological: Negative.   Hematological: Negative.   Psychiatric/Behavioral: Negative.   All other systems reviewed and are negative.  BP 128/66 mmHg  Pulse 73  Ht 5\' 9"  (1.753 m)  Wt 224 lb 12 oz (101.946 kg)  BMI 33.17 kg/m2  Physical Exam  Constitutional: He is oriented to person, place, and time. He appears well-developed and well-nourished.  Obese  HENT:  Head: Normocephalic.  Right Ear: External ear normal.  Left Ear: External ear normal.  Nose: Nose normal.  Mouth/Throat: Oropharynx is clear and moist.  Eyes: Conjunctivae are normal. Pupils are equal, round, and reactive to light. Right eye exhibits no discharge. No scleral icterus.  Neck: Normal range of motion. Neck supple. No JVD present. Carotid bruit is present.  Cardiovascular: Normal rate, regular rhythm, S1 normal, S2 normal and intact distal pulses.  Exam reveals no gallop and no friction rub.   Murmur heard.  Crescendo systolic murmur is present with a grade of 2/6  Pulmonary/Chest: Effort normal and breath sounds normal. No respiratory distress. He has no wheezes. He has no rales. He exhibits no tenderness.  Abdominal: Soft. Bowel sounds are normal. He exhibits no distension.  There is no tenderness.  Musculoskeletal: Normal range of motion. He exhibits no edema or tenderness.  Lymphadenopathy:    He has no cervical adenopathy.  Neurological: He is alert and oriented to person, place, and time. Coordination normal.  Skin: Skin is warm and dry. No rash noted. No erythema.  Psychiatric: He has a normal mood and affect. His behavior is normal. Judgment and thought content normal.      Assessment and Plan   Nursing note and vitals reviewed.

## 2015-04-27 NOTE — Assessment & Plan Note (Signed)
We have recommended that we schedule him for a carotid ultrasound. Perhaps this could be done in [place] for his convenience

## 2015-04-27 NOTE — Assessment & Plan Note (Signed)
Blood pressure is well controlled on today's visit. No changes made to the medications. 

## 2015-04-27 NOTE — Assessment & Plan Note (Signed)
High risk of CAD progression Heavy smoker, hyperlipidemia Currently denies having anginal symptoms. Will need to be watched closely He is considering transfering his care up to [place] as this would be closer for him

## 2015-04-29 ENCOUNTER — Other Ambulatory Visit: Payer: Self-pay | Admitting: Internal Medicine

## 2015-06-28 ENCOUNTER — Encounter: Payer: Self-pay | Admitting: *Deleted

## 2015-06-28 ENCOUNTER — Telehealth: Payer: Self-pay | Admitting: *Deleted

## 2015-06-28 NOTE — Telephone Encounter (Signed)
No answer. Sent letter. °

## 2015-07-07 ENCOUNTER — Telehealth: Payer: Self-pay | Admitting: Pulmonary Disease

## 2015-07-07 NOTE — Telephone Encounter (Signed)
Spoke with pt, needs new cpap mask.  Pt last seen 09/2013 with [facility].   Pt scheduled for Monday at 12:00 with TP to be seen in order for mask to be ordered.  Nothing further needed at this time.

## 2015-07-07 NOTE — Telephone Encounter (Signed)
Patient called to ask [place] to call [phone] to get in touch with him.  Pharmacy is [facility] in [place].

## 2015-07-11 ENCOUNTER — Ambulatory Visit (INDEPENDENT_AMBULATORY_CARE_PROVIDER_SITE_OTHER): Payer: Medicare Other | Admitting: Adult Health

## 2015-07-11 ENCOUNTER — Encounter: Payer: Self-pay | Admitting: Adult Health

## 2015-07-11 ENCOUNTER — Other Ambulatory Visit: Payer: Self-pay | Admitting: Cardiovascular Disease

## 2015-07-11 VITALS — BP 112/72 | HR 57 | Temp 98.3°F | Ht 69.0 in | Wt 212.0 lb

## 2015-07-11 DIAGNOSIS — G4733 Obstructive sleep apnea (adult) (pediatric): Secondary | ICD-10-CM | POA: Diagnosis not present

## 2015-07-11 NOTE — Patient Instructions (Signed)
Will send an order to your home care company to get new supplies. Work on weight loss Do not drive if sleepy.  Followup with Dr. Halford Chessman in 1 year and As needed

## 2015-07-17 NOTE — Progress Notes (Signed)
   Subjective:    Patient ID: [name], male    DOB: Sep 26, 1945, 69 y.o.   MRN: [id number]  HPI 69 yo male with OSA previously followed by Dr. [name]   TEST  NPSG 2003 [facility] 16/hr   07/11/15 Follow up : OSA  Pt returns for annual follow up for sleep apnea. Pt says he needs a new mask as it is old and is leaking.  Wears Bilevel on EPAP 11 /IPAP 14   , does well with it.  Wears for 5-7hrs each night. Download shows good compliance  Feels rested with no sign daytime sleepiness .     Review of Systems Constitutional:   No  weight loss, night sweats,  Fevers, chills, fatigue, or  lassitude.  HEENT:   No headaches,  Difficulty swallowing,  Tooth/dental problems, or  Sore throat,                No sneezing, itching, ear ache, nasal congestion, post nasal drip,   CV:  No chest pain,  Orthopnea, PND, swelling in lower extremities, anasarca, dizziness, palpitations, syncope.   GI  No heartburn, indigestion, abdominal pain, nausea, vomiting, diarrhea, change in bowel habits, loss of appetite, bloody stools.   Resp: No shortness of breath with exertion or at rest.  No excess mucus, no productive cough,  No non-productive cough,  No coughing up of blood.  No change in color of mucus.  No wheezing.  No chest wall deformity  Skin: no rash or lesions.  GU: no dysuria, change in color of urine, no urgency or frequency.  No flank pain, no hematuria   MS:  No joint pain or swelling.  No decreased range of motion.  No back pain.  Psych:  No change in mood or affect. No depression or anxiety.  No memory loss.         Objective:   Physical Exam  GEN: A/Ox3; pleasant , NAD,obese   HEENT:  /AT,  EACs-clear, TMs-wnl, NOSE-clear, THROAT-clear, no lesions, no postnasal drip or exudate noted. Class 2-3 MP airway   NECK:  Supple w/ fair ROM; no JVD; normal carotid impulses w/o bruits; no thyromegaly or nodules palpated; no lymphadenopathy.  RESP  Clear  P & A; w/o, wheezes/ rales/ or  rhonchi.no accessory muscle use, no dullness to percussion  CARD:  RRR, no m/r/g  , no peripheral edema, pulses intact, no cyanosis or clubbing.  GI:   Soft & nt; nml bowel sounds; no organomegaly or masses detected.  Musco: Warm bil, no deformities or joint swelling noted.   Neuro: alert, no focal deficits noted.    Skin: Warm, no lesions or rashes        Assessment & Plan:

## 2015-07-17 NOTE — Assessment & Plan Note (Addendum)
Pt is doing well with Bilevel  Order sent to DME for new supplies , encouraged him to replace mask /tubing when old.   Plan  Will send an order to your home care company to get new supplies. Work on weight loss Do not drive if sleepy.  Followup with Dr. Halford Chessman in 1 year and As needed

## 2015-08-01 ENCOUNTER — Telehealth: Payer: Self-pay | Admitting: Adult Health

## 2015-08-01 NOTE — Telephone Encounter (Signed)
Per TP after reviewing CPAP Compliance Report Usage shows 5 hours a day Good control No changes  Attempted to call pt at [phone] but was unable to LVM. LVM at [phone] for pt to return our call.

## 2015-08-02 NOTE — Telephone Encounter (Signed)
LMTCB on named VM of "[name]"

## 2015-08-03 NOTE — Telephone Encounter (Signed)
Spoke with the pt and notified of results per TP  He verbalized understanding and denied any questions

## 2015-10-06 ENCOUNTER — Other Ambulatory Visit: Payer: Self-pay | Admitting: Cardiovascular Disease

## 2015-10-17 DIAGNOSIS — Z23 Encounter for immunization: Secondary | ICD-10-CM | POA: Diagnosis not present

## 2015-10-31 ENCOUNTER — Encounter: Payer: Self-pay | Admitting: Cardiovascular Disease

## 2015-10-31 ENCOUNTER — Telehealth: Payer: Self-pay | Admitting: Cardiovascular Disease

## 2015-10-31 NOTE — Telephone Encounter (Signed)
3 attempts to schedule fu from recall list. Number not in service no alt #.   Deleting recall.  Will mail reminder letter.

## 2015-11-15 DIAGNOSIS — M4306 Spondylolysis, lumbar region: Secondary | ICD-10-CM | POA: Diagnosis not present

## 2015-11-15 DIAGNOSIS — M4800 Spinal stenosis, site unspecified: Secondary | ICD-10-CM | POA: Diagnosis not present

## 2015-11-16 ENCOUNTER — Ambulatory Visit (INDEPENDENT_AMBULATORY_CARE_PROVIDER_SITE_OTHER): Payer: Medicare HMO | Admitting: Cardiology

## 2015-11-16 ENCOUNTER — Encounter: Payer: Self-pay | Admitting: Cardiology

## 2015-11-16 VITALS — BP 164/80 | HR 77 | Ht 69.0 in | Wt 225.0 lb

## 2015-11-16 DIAGNOSIS — E785 Hyperlipidemia, unspecified: Secondary | ICD-10-CM | POA: Diagnosis not present

## 2015-11-16 DIAGNOSIS — I2510 Atherosclerotic heart disease of native coronary artery without angina pectoris: Secondary | ICD-10-CM | POA: Diagnosis not present

## 2015-11-16 DIAGNOSIS — I10 Essential (primary) hypertension: Secondary | ICD-10-CM

## 2015-11-16 DIAGNOSIS — I6523 Occlusion and stenosis of bilateral carotid arteries: Secondary | ICD-10-CM | POA: Diagnosis not present

## 2015-11-16 NOTE — Progress Notes (Signed)
Patient ID: MIKIEL BERGMAN, male   DOB: 25-Mar-1946, 70 y.o.   MRN: [id number]     Clinical Summary Mr. Dromgoole is a 70 y.o.male last seen by Dr Rockey Situ, this is our first visit together. He is seen for the following medical problems.   1. HTN - does not check at home - compliant with meds. Started on solumedrol for back pain recently, he thinks current high bp is related.   2. Carotid stenosis - A999333 RICA, LICA [id number]. No history of stroke - has been on ASA and plavix, presume due to his history cerebrovascular disease   3. Hyperlipidemia - compliant with crestor - 08/2015 TC 127 TG 288 HDL 27 LDL 42  4. OSA - compliant with CPAP  5. Prostate CA - history of prior seed implant, reports cancer free  6. CAD - incidental findings form prior chest CT in 2013. Do not see a previous functional assessment for ischemia. The patient has not had symptoms of angina - denies any history of heart cath or stents. - denies any chest pain or SOB.   Past Medical History  Diagnosis Date  . Hyperlipidemia   . Type 2 diabetes mellitus ([place])   . History of prostate cancer   . Hypertension   . Coronary artery disease     CARDIOLOGIST-- DR Rockey Situ [name])  . OA (osteoarthritis)   . Cervical fusion syndrome     LEFT ARM NUMBNESS--  CONTROLLED W/ GABAPENTIN  . OSA on CPAP     BiPAP  . DDD (degenerative disc disease), cervical   . Spondylosis, cervical   . DDD (degenerative disc disease), lumbar   . Occlusion and stenosis of carotid artery without mention of cerebral infarction CARDIOLOGIST  -- DR Rockey Situ    DUPLEX  08-05-2012  RICA  123456   LICA [id number]  . Nocturia   . Urgency of urination   . Bladder cancer ([place])     hx turbt in 2004  . Meningitis   . Diverticulosis   . Leiomyoma     colon     Allergies  Allergen Reactions  . Dilaudid [Hydromorphone Hcl]   . Toradol [Ketorolac Tromethamine] Other (See Comments)    Causes hallucinations  . Lyrica [Pregabalin] Nausea  Only and Other (See Comments)    Make me feel "bad"     Current Outpatient Prescriptions  Medication Sig Dispense Refill  . allopurinol (ZYLOPRIM) 300 MG tablet Take 300 mg by mouth at bedtime.     Marland Kitchen amLODipine (NORVASC) 10 MG tablet Take 10 mg by mouth daily.    Marland Kitchen amLODipine (NORVASC) 10 MG tablet TAKE 1 TABLET BY MOUTH EVERY DAY 90 tablet 1  . aspirin 81 MG EC tablet Take 81 mg by mouth at bedtime.     . celecoxib (CELEBREX) 200 MG capsule Take 200 mg by mouth every morning.    . cholecalciferol (VITAMIN D) 1000 UNITS tablet Take 1,000 Units by mouth daily.    . clopidogrel (PLAVIX) 75 MG tablet Take 75 mg by mouth daily with breakfast.     . colchicine 0.6 MG tablet Take 0.6 mg by mouth daily as needed. For gout    . docusate sodium (COLACE) 100 MG capsule Take 200 mg by mouth daily.    Marland Kitchen dutasteride (AVODART) 0.5 MG capsule Take 0.5 mg by mouth daily.    . finasteride (PROSCAR) 5 MG tablet Take 5 mg by mouth at bedtime.    . furosemide (LASIX) 40 MG tablet Take  40 mg by mouth daily.     Marland Kitchen gabapentin (NEURONTIN) 300 MG capsule Take 300 mg by mouth 3 (three) times daily.      Marland Kitchen HYDROcodone-acetaminophen (NORCO/VICODIN) 5-325 MG per tablet Take 1 tablet by mouth every 6 (six) hours as needed for moderate pain.    Marland Kitchen ibuprofen (ADVIL,MOTRIN) 200 MG tablet Take 800 mg by mouth every 8 (eight) hours as needed for moderate pain.    Marland Kitchen insulin detemir (LEVEMIR) 100 UNIT/ML injection Inject 25 Units into the skin at bedtime.     Marland Kitchen labetalol (NORMODYNE) 100 MG tablet Take 300 mg by mouth 2 (two) times daily. Take 3 (100 mg) tablets twice a day.    . losartan (COZAAR) 100 MG tablet TAKE 1 TABLET BY MOUTH EVERY DAY 30 tablet 9  . Naproxen Sodium (ALEVE) 220 MG CAPS Take 1 capsule by mouth daily as needed.    . niacin (NIASPAN) 1000 MG CR tablet Take 2,000 mg by mouth at bedtime.     Marland Kitchen omega-3 acid ethyl esters (LOVAZA) 1 G capsule Take 1 g by mouth 2 (two) times daily.     . polyethylene glycol  powder (GLYCOLAX/MIRALAX) powder TAKE 17MG (1CAPFUL) BY MOUTH(MIXED WITH WATER/JUICE) DAILY 527 g 0  . Potassium Gluconate 595 MG CAPS Take 1 capsule by mouth daily.    . rosuvastatin (CRESTOR) 10 MG tablet Take 10 mg by mouth every morning.     . Tamsulosin HCl (FLOMAX) 0.4 MG CAPS Take 0.4 mg by mouth at bedtime.    Marland Kitchen tiZANidine (ZANAFLEX) 4 MG tablet Take 4 mg by mouth every 6 (six) hours as needed for muscle spasms.    . [DISCONTINUED] diphenhydramine-acetaminophen (TYLENOL PM) 25-500 MG TABS Take 1 tablet by mouth at bedtime as needed.      . [DISCONTINUED] potassium chloride (K-DUR) 10 MEQ tablet Take 2 tablets (20 mEq total) by mouth daily. 30 tablet 6   No current facility-administered medications for this visit.     Past Surgical History  Procedure Laterality Date  . Cervical fusion  05-11-2005    C3  --  C7  . Septoplasty  1998  . Colonoscopy  04/2011  . Yag laser application  [id number]    Procedure: YAG LASER APPLICATION;  Surgeon: Elta Guadeloupe T. Gershon Crane, MD;  Location: [facility];  Service: Ophthalmology;  Laterality: Right;  . Radioactive prostate seed implants  08-19-2003  . Lumbar fusion  03-26-2012    L3 --  L5  . Cataract extraction w/ intraocular lens  implant, bilateral  2013  . Appendectomy  1990'S  . Anterior removal cage and plate c3-c7/ corpectomy c7 (fx)/  allograft and fusion c3 -- t1/  posterior decompression bilateral laminectomy c4 -- 6 & partial c3/  posterolateral arthrodesis c3 - t1  06-05-2005  . Intraoperative arteriogram  CATH LAB  01-05-2009  DR Einar Gip    RICA   ACUTE ANGLE 80-85% STENOSIS  . Endoscopic repair csf leak via nasal passage  2011  . Cystoscopy w/ retrogrades Bilateral 08/19/2013    Procedure: CYSTOSCOPY WITH RETROGRADE PYELOGRAM;  Surgeon: Molli Hazard, MD;  [facility];  Service: Urology;  Laterality: Bilateral;  . Transurethral resection of bladder tumor N/A 08/19/2013    Procedure: TRANSURETHRAL RESECTION OF BLADDER  TUMOR (TURBT);  Surgeon: Molli Hazard, MD;  Location: [facility];  Service: Urology;  Laterality: N/A;     Allergies  Allergen Reactions  . Dilaudid [Hydromorphone Hcl]   . Toradol [Ketorolac Tromethamine]  Other (See Comments)    Causes hallucinations  . Lyrica [Pregabalin] Nausea Only and Other (See Comments)    Make me feel "bad"      Family History  Problem Relation Age of Onset  . Cancer Sister     male organs  . Diabetes Sister     family hx  . Arthritis Other     entire family  . Breast cancer Mother     mets  . Heart disease Father     MI  . Colon cancer Brother   . Diabetes Brother     x 2  . Diabetes Mother      Social History Mr. Balthaser reports that he has been smoking Cigarettes.  He has a 40 pack-year smoking history. He has never used smokeless tobacco. Mr. Roelofs reports that he does not drink alcohol.   Review of Systems CONSTITUTIONAL: No weight loss, fever, chills, weakness or fatigue.  HEENT: Eyes: No visual loss, blurred vision, double vision or yellow sclerae.No hearing loss, sneezing, congestion, runny nose or sore throat.  SKIN: No rash or itching.  CARDIOVASCULAR: per hpi RESPIRATORY: No shortness of breath, cough or sputum.  GASTROINTESTINAL: No anorexia, nausea, vomiting or diarrhea. No abdominal pain or blood.  GENITOURINARY: No burning on urination, no polyuria NEUROLOGICAL: No headache, dizziness, syncope, paralysis, ataxia, numbness or tingling in the extremities. No change in bowel or bladder control.  MUSCULOSKELETAL: No muscle, back pain, joint pain or stiffness.  LYMPHATICS: No enlarged nodes. No history of splenectomy.  PSYCHIATRIC: No history of depression or anxiety.  ENDOCRINOLOGIC: No reports of sweating, cold or heat intolerance. No polyuria or polydipsia.  Marland Kitchen   Physical Examination Filed Vitals:   11/16/15 1016  BP: 164/80  Pulse: 77   Filed Vitals:   11/16/15 1016  Height: 5\' 9"  (1.753 m)    Weight: 225 lb (102.059 kg)    Gen: resting comfortably, no acute distress HEENT: no scleral icterus, pupils equal round and reactive, no palptable cervical adenopathy,  CV: RRR, no m/r/g, no jvd Resp: Clear to auscultation bilaterally GI: abdomen is soft, non-tender, non-distended, normal bowel sounds, no hepatosplenomegaly MSK: extremities are warm, no edema.  Skin: warm, no rash Neuro:  no focal deficits Psych: appropriate affect      Assessment and Plan  1. HTN - elevated in clinic, patient recently started on solumedrol for severe back pain. Prior visits show bp controlled. - will contiue to monitor  2. Carotid stenosis - will repeat carotid US  3. Hyperlipidemia - continue crestor, stop niacin due to lack of outcome benefits  4. CAD - incidental finding on CT scan, he has not had any cardiac symptoms - continue risk factor modificatoin - on ASA and plavix, I presume due to his history of cerebrovascular disease. From records no cardiac indication.   F/u 6 months      Arnoldo Lenis, M.D., F.A.C.C.

## 2015-11-16 NOTE — Patient Instructions (Signed)
Medication Instructions:  Stop niacin   Labwork: none  Testing/Procedures: Your physician has requested that you have a carotid duplex. This test is an ultrasound of the carotid arteries in your neck. It looks at blood flow through these arteries that supply the brain with blood. Allow one hour for this exam. There are no restrictions or special instructions.    Follow-Up: Your physician wants you to follow-up in: 6 months .  You will receive a reminder letter in the mail two months in advance. If you don't receive a letter, please call our office to schedule the follow-up appointment.   Any Other Special Instructions Will Be Listed Below (If Applicable).     If you need a refill on your cardiac medications before your next appointment, please call your pharmacy.

## 2015-11-23 ENCOUNTER — Ambulatory Visit (HOSPITAL_COMMUNITY)
Admission: RE | Admit: 2015-11-23 | Discharge: 2015-11-23 | Disposition: A | Discharge status: Home / Self Care | Payer: Medicare HMO | Source: Ambulatory Visit | Attending: Cardiology | Admitting: Cardiology

## 2015-11-23 DIAGNOSIS — I6523 Occlusion and stenosis of bilateral carotid arteries: Secondary | ICD-10-CM | POA: Diagnosis not present

## 2015-11-25 ENCOUNTER — Telehealth: Payer: Self-pay

## 2015-11-25 ENCOUNTER — Other Ambulatory Visit: Payer: Self-pay

## 2015-11-25 DIAGNOSIS — I6521 Occlusion and stenosis of right carotid artery: Secondary | ICD-10-CM

## 2015-11-25 NOTE — Telephone Encounter (Signed)
Spoke to pt's wife and let her know the results to his carotid US. She voiced understanding, and agreed that her husband needs to see [facility] and would make sure he did. I put the referral in and added Dr. Nelly Laurence request for pt to see the dr. He saw prior to Dr. Kellie Simmering.

## 2016-01-13 ENCOUNTER — Encounter: Payer: Self-pay | Admitting: Vascular Surgery

## 2016-01-19 ENCOUNTER — Other Ambulatory Visit: Payer: Self-pay | Admitting: *Deleted

## 2016-01-19 DIAGNOSIS — I6521 Occlusion and stenosis of right carotid artery: Secondary | ICD-10-CM

## 2016-01-20 ENCOUNTER — Ambulatory Visit (INDEPENDENT_AMBULATORY_CARE_PROVIDER_SITE_OTHER): Payer: Medicare HMO | Admitting: Vascular Surgery

## 2016-01-20 ENCOUNTER — Ambulatory Visit (HOSPITAL_COMMUNITY)
Admission: RE | Admit: 2016-01-20 | Discharge: 2016-01-20 | Disposition: A | Discharge status: Home / Self Care | Payer: Medicare HMO | Source: Ambulatory Visit | Attending: Vascular Surgery | Admitting: Vascular Surgery

## 2016-01-20 ENCOUNTER — Encounter: Payer: Self-pay | Admitting: Vascular Surgery

## 2016-01-20 VITALS — BP 144/73 | HR 79 | Temp 98.0°F | Resp 16 | Ht 69.0 in | Wt 216.0 lb

## 2016-01-20 DIAGNOSIS — I6521 Occlusion and stenosis of right carotid artery: Secondary | ICD-10-CM

## 2016-01-20 DIAGNOSIS — E785 Hyperlipidemia, unspecified: Secondary | ICD-10-CM | POA: Insufficient documentation

## 2016-01-20 DIAGNOSIS — I10 Essential (primary) hypertension: Secondary | ICD-10-CM | POA: Diagnosis not present

## 2016-01-20 DIAGNOSIS — I779 Disorder of arteries and arterioles, unspecified: Secondary | ICD-10-CM

## 2016-01-20 DIAGNOSIS — E119 Type 2 diabetes mellitus without complications: Secondary | ICD-10-CM | POA: Diagnosis not present

## 2016-01-20 DIAGNOSIS — G4733 Obstructive sleep apnea (adult) (pediatric): Secondary | ICD-10-CM | POA: Diagnosis not present

## 2016-01-20 DIAGNOSIS — I739 Peripheral vascular disease, unspecified: Principal | ICD-10-CM

## 2016-01-20 NOTE — Progress Notes (Signed)
New Carotid Patient  Referred by:  [name], MD [address] [place], [place] [address]  Reason for referral: right high grade carotid stenosis  History of Present Illness  [name] is a 70 y.o. ([birth year]) male who presents with chief complaint: known right carotid stenosis.  This patient was previously seen by Dr. Justine Null and diagnosed with a high grade right carotid stenosis.  He thought he was poor surgical risk due to multi-level cervical fusion and poor carotid stenting risk due to morphology of his plaque.  Previous carotid studies demonstrated: RICA A999333 stenosis, LICA 123456 stenosis.  Patient has no history of TIA or stroke symptom.  The patient has never had amaurosis fugax or monocular blindness.  The patient has never had facial drooping or hemiplegia.  The patient has never had receptive or expressive aphasia.   The patient's risks factors for carotid disease include: HLD, DM, HTN, and prior smoker.  Past Medical History  Diagnosis Date  . Hyperlipidemia   . Type 2 diabetes mellitus ([place])   . History of prostate cancer   . Hypertension   . Coronary artery disease     CARDIOLOGIST-- DR Rockey Situ [name])  . OA (osteoarthritis)   . Cervical fusion syndrome     LEFT ARM NUMBNESS--  CONTROLLED W/ GABAPENTIN  . OSA on CPAP     BiPAP  . DDD (degenerative disc disease), cervical   . Spondylosis, cervical   . DDD (degenerative disc disease), lumbar   . Occlusion and stenosis of carotid artery without mention of cerebral infarction CARDIOLOGIST  -- DR Rockey Situ    DUPLEX  08-05-2012  RICA  123456   LICA [id number]  . Nocturia   . Urgency of urination   . Bladder cancer ([place])     hx turbt in 2004  . Meningitis   . Diverticulosis   . Leiomyoma     colon    Past Surgical History  Procedure Laterality Date  . Cervical fusion  05-11-2005    C3  --  C7  . Septoplasty  1998  . Colonoscopy  04/2011  . Yag laser application  [id number]    Procedure: YAG  LASER APPLICATION;  Surgeon: Elta Guadeloupe T. Gershon Crane, MD;  Location: [facility];  Service: Ophthalmology;  Laterality: Right;  . Radioactive prostate seed implants  08-19-2003  . Lumbar fusion  03-26-2012    L3 --  L5  . Cataract extraction w/ intraocular lens  implant, bilateral  2013  . Appendectomy  1990'S  . Anterior removal cage and plate c3-c7/ corpectomy c7 (fx)/  allograft and fusion c3 -- t1/  posterior decompression bilateral laminectomy c4 -- 6 & partial c3/  posterolateral arthrodesis c3 - t1  06-05-2005  . Intraoperative arteriogram  CATH LAB  01-05-2009  DR Einar Gip    RICA   ACUTE ANGLE 80-85% STENOSIS  . Endoscopic repair csf leak via nasal passage  2011  . Cystoscopy w/ retrogrades Bilateral 08/19/2013    Procedure: CYSTOSCOPY WITH RETROGRADE PYELOGRAM;  Surgeon: Molli Hazard, MD;  Location: [facility];  Service: Urology;  Laterality: Bilateral;  . Transurethral resection of bladder tumor N/A 08/19/2013    Procedure: TRANSURETHRAL RESECTION OF BLADDER TUMOR (TURBT);  Surgeon: Molli Hazard, MD;  Location: [facility];  Service: Urology;  Laterality: N/A;    Social History   Social History  . Marital Status: Married    Spouse Name: N/A  . Number of Children: 2  .  Years of Education: N/A   Occupational History  . retired//disabled    Social History Main Topics  . Smoking status: Former Smoker -- 1.00 packs/day for 40 years    Types: Cigarettes    Quit date: 10/19/2015  . Smokeless tobacco: Never Used     Comment: pt quits smoking periodically,   states has stopped smoking 08-06-2013 (approx) this time  . Alcohol Use: No  . Drug Use: No  . Sexual Activity: Not on file   Other Topics Concern  . Not on file   Social History Narrative     Family History  Problem Relation Age of Onset  . Cancer Sister     male organs  . Diabetes Sister     family hx  . Arthritis Other     entire family  . Breast cancer Mother     mets  .  Heart disease Father     MI  . Colon cancer Brother   . Diabetes Brother     x 2  . Diabetes Mother     Current Outpatient Prescriptions  Medication Sig Dispense Refill  . allopurinol (ZYLOPRIM) 300 MG tablet Take 300 mg by mouth at bedtime.     Marland Kitchen amLODipine (NORVASC) 10 MG tablet Take 10 mg by mouth daily.    Marland Kitchen amLODipine (NORVASC) 10 MG tablet TAKE 1 TABLET BY MOUTH EVERY DAY 90 tablet 1  . aspirin 81 MG EC tablet Take 81 mg by mouth at bedtime.     . celecoxib (CELEBREX) 200 MG capsule Take 200 mg by mouth every morning.    . cholecalciferol (VITAMIN D) 1000 UNITS tablet Take 1,000 Units by mouth daily.    . clopidogrel (PLAVIX) 75 MG tablet Take 75 mg by mouth daily with breakfast.     . colchicine 0.6 MG tablet Take 0.6 mg by mouth daily as needed. For gout    . docusate sodium (COLACE) 100 MG capsule Take 200 mg by mouth daily.    Marland Kitchen dutasteride (AVODART) 0.5 MG capsule Take 0.5 mg by mouth daily.    . finasteride (PROSCAR) 5 MG tablet Take 5 mg by mouth at bedtime.    . furosemide (LASIX) 40 MG tablet Take 40 mg by mouth daily.     Marland Kitchen gabapentin (NEURONTIN) 300 MG capsule Take 300 mg by mouth 3 (three) times daily.      . insulin detemir (LEVEMIR) 100 UNIT/ML injection Inject 25 Units into the skin at bedtime.     Marland Kitchen labetalol (NORMODYNE) 100 MG tablet Take 300 mg by mouth 2 (two) times daily. Take 3 (100 mg) tablets twice a day.    . losartan (COZAAR) 100 MG tablet TAKE 1 TABLET BY MOUTH EVERY DAY 30 tablet 9  . Naproxen Sodium (ALEVE) 220 MG CAPS Take 1 capsule by mouth daily as needed.    Marland Kitchen omega-3 acid ethyl esters (LOVAZA) 1 G capsule Take 1 g by mouth 2 (two) times daily.     . polyethylene glycol powder (GLYCOLAX/MIRALAX) powder TAKE 17MG (1CAPFUL) BY MOUTH(MIXED WITH WATER/JUICE) DAILY 527 g 0  . Potassium Gluconate 595 MG CAPS Take 1 capsule by mouth daily.    . rosuvastatin (CRESTOR) 10 MG tablet Take 10 mg by mouth every morning.     Marland Kitchen HYDROcodone-acetaminophen  (NORCO/VICODIN) 5-325 MG per tablet Take 1 tablet by mouth every 6 (six) hours as needed for moderate pain. Reported on 01/20/2016    . ibuprofen (ADVIL,MOTRIN) 200 MG tablet Take 800 mg by  mouth every 8 (eight) hours as needed for moderate pain. Reported on 01/20/2016    . Tamsulosin HCl (FLOMAX) 0.4 MG CAPS Take 0.4 mg by mouth at bedtime. Reported on 01/20/2016    . tiZANidine (ZANAFLEX) 4 MG tablet Take 4 mg by mouth every 6 (six) hours as needed for muscle spasms. Reported on 01/20/2016    . [DISCONTINUED] diphenhydramine-acetaminophen (TYLENOL PM) 25-500 MG TABS Take 1 tablet by mouth at bedtime as needed.      . [DISCONTINUED] potassium chloride (K-DUR) 10 MEQ tablet Take 2 tablets (20 mEq total) by mouth daily. 30 tablet 6   No current facility-administered medications for this visit.    Allergies  Allergen Reactions  . Dilaudid [Hydromorphone Hcl]   . Toradol [Ketorolac Tromethamine] Other (See Comments)    Causes hallucinations  . Lyrica [Pregabalin] Nausea Only and Other (See Comments)    Make me feel "bad"    REVIEW OF SYSTEMS:  (Positives checked otherwise negative)  CARDIOVASCULAR:   [ ]  chest pain,  [ ]  chest pressure,  [ ]  palpitations,  [ ]  shortness of breath when laying flat,  [ ]  shortness of breath with exertion,   [x]  pain in feet when walking,  [ ]  pain in feet when laying flat, [ ]  history of blood clot in veins (DVT),  [ ]  history of phlebitis,  [ ]  swelling in legs,  [ ]  varicose veins  PULMONARY:   [ ]  productive cough,  [ ]  asthma,  [ ]  wheezing  NEUROLOGIC:   [x]  weakness in arms or legs,  [ ]  numbness in arms or legs,  [ ]  difficulty speaking or slurred speech,  [ ]  temporary loss of vision in one eye,  [x]  dizziness  HEMATOLOGIC:   [ ]  bleeding problems,  [ ]  problems with blood clotting too easily  MUSCULOSKEL:   [ ]  joint pain, [ ]  joint swelling  GASTROINTEST:   [ ]  vomiting blood,  [ ]  blood in stool     GENITOURINARY:   [ ]   burning with urination,  [ ]  blood in urine  PSYCHIATRIC:   [ ]  history of major depression  INTEGUMENTARY:   [ ]  rashes,  [ ]  ulcers  CONSTITUTIONAL:   [ ]  fever,  [ ]  chills   For VQI Use Only  PRE-ADM LIVING: Home  AMB STATUS: Ambulatory  CAD Sx: None  PRIOR CHF: None  STRESS TEST: [x]  No, [ ]  Normal, [ ]  + ischemia, [ ]  + MI, [ ]  Both   Physical Examination  Filed Vitals:   01/20/16 1254 01/20/16 1302 01/20/16 1303  BP: 151/77 152/74 144/73  Pulse: 82 79 79  Temp: 98 F (36.7 C)    Resp: 16    Height: 5\' 9"  (1.753 m)    Weight: 216 lb (97.977 kg)    SpO2: 96%     Body mass index is 31.88 kg/(m^2).  General: A&O x 3, WD, mildly obese  Head: Volo/AT  Ear/Nose/Throat: Hearing grossly intact, nares w/o erythema or drainage, oropharynx w/o Erythema/Exudate, Mallampati score: 3  Eyes: PERRLA, EOMI  Neck: Supple, no nuchal rigidity, no palpable LAD  Pulmonary: Sym exp, good air movt, CTAB, no rales, rhonchi, & wheezing  Cardiac: RRR, Nl S1, S2, no Murmurs, rubs or gallops  Vascular: Vessel Right Left  Radial Palpable Palpable  Brachial Palpable Palpable  Carotid Palpable, without bruit Palpable, without bruit  Aorta  Not palpable N/A  Femoral Palpable Palpable  Popliteal Not palpable  Not palpable  PT Faintly Palpable Faintly Palpable  DP Palpable Palpable   Gastrointestinal: soft, NTND, -G/R, - HSM, - masses, - CVAT B  Musculoskeletal: M/S 5/5 throughout , Extremities without ischemic changes, limited ROM in neck (limited axial rotation)  Neurologic: CN 2-12 intact , Pain and light touch intact in extremities , Motor exam as listed above  Psychiatric: Judgment intact, Mood & affect appropriate for pt's clinical situation  Dermatologic: See M/S exam for extremity exam, no rashes otherwise noted  Lymph : No Cervical, Axillary, or Inguinal lymphadenopathy   Non-Invasive Vascular Imaging  R CAROTID DUPLEX (Date: 01/20/2016):   [name] stenosis:  60-79% (30/83 c/s)  R VA: patent and antegrade   Outside Studies/Documentation 10 pages of outside documents were reviewed including: prior [facility] chart.   Medical Decision Making  COUREY MOTA is a 70 y.o. male who presents with: R asx ICA stenosis 60-79%., history of L asx ICA stenosis <40%   Based on the patient's vascular studies and examination, I have offered the patient: q6 month B carotid duplex.  At this point, pt does not meet the >80% threshold for intervention.  His limited neck rotation will likely make him an anatomic high risk for carotid endarterectomy.  Repeat carotid angiography would likely be needed to evaluate his risk for CAS.  I discussed in depth with the patient the nature of atherosclerosis, and emphasized the importance of maximal medical management including strict control of blood pressure, blood glucose, and lipid levels, obtaining regular exercise, antiplatelet agents, and cessation of smoking.   The patient is currently on a statin: Crestor.  The patient is currently on an anti-platelet: ASA, Plavix  The patient is aware that without maximal medical management the underlying atherosclerotic disease process will progress, limiting the benefit of any interventions.  Thank you for allowing [place] to participate in this patient's care.   Adele Barthel, MD [facility] Office: [phone] Pager: [phone]  01/20/2016, 1:34 PM

## 2016-01-23 DIAGNOSIS — E782 Mixed hyperlipidemia: Secondary | ICD-10-CM | POA: Diagnosis not present

## 2016-01-23 DIAGNOSIS — E119 Type 2 diabetes mellitus without complications: Secondary | ICD-10-CM | POA: Diagnosis not present

## 2016-01-23 DIAGNOSIS — E559 Vitamin D deficiency, unspecified: Secondary | ICD-10-CM | POA: Diagnosis not present

## 2016-01-25 DIAGNOSIS — E119 Type 2 diabetes mellitus without complications: Secondary | ICD-10-CM | POA: Diagnosis not present

## 2016-01-25 DIAGNOSIS — B353 Tinea pedis: Secondary | ICD-10-CM | POA: Diagnosis not present

## 2016-01-25 DIAGNOSIS — E782 Mixed hyperlipidemia: Secondary | ICD-10-CM | POA: Diagnosis not present

## 2016-01-25 DIAGNOSIS — I10 Essential (primary) hypertension: Secondary | ICD-10-CM | POA: Diagnosis not present

## 2016-02-07 ENCOUNTER — Telehealth: Payer: Self-pay | Admitting: Adult Health

## 2016-02-07 DIAGNOSIS — G4733 Obstructive sleep apnea (adult) (pediatric): Secondary | ICD-10-CM

## 2016-02-07 NOTE — Telephone Encounter (Signed)
Left message for patient to call back  

## 2016-02-08 NOTE — Telephone Encounter (Signed)
LM for pt that mask has been ordered. Nothing further needed.

## 2016-02-08 NOTE — Telephone Encounter (Signed)
Okay to send order. 

## 2016-02-08 NOTE — Telephone Encounter (Signed)
Spoke with pt, states his current bipap mask is leaking, requesting a new rx for one be sent to [facility].  Pt is a former [place] pt, scheduled to follow up with VS later this month.   VS ok to order?  Thanks!

## 2016-02-09 DIAGNOSIS — G4733 Obstructive sleep apnea (adult) (pediatric): Secondary | ICD-10-CM | POA: Diagnosis not present

## 2016-03-07 NOTE — Addendum Note (Signed)
Addended by: Mena Goes on: 03/07/2016 04:58 PM   Modules accepted: Orders

## 2016-04-04 ENCOUNTER — Other Ambulatory Visit: Payer: Self-pay | Admitting: Cardiovascular Disease

## 2016-04-04 NOTE — Telephone Encounter (Signed)
Review for refill, Thank you. 

## 2016-05-08 ENCOUNTER — Other Ambulatory Visit: Payer: Self-pay | Admitting: Cardiovascular Disease

## 2016-05-08 ENCOUNTER — Telehealth: Payer: Self-pay | Admitting: Cardiology

## 2016-05-08 NOTE — Telephone Encounter (Signed)
I spoke with wife,pt has f/u with Dr Harl Bowie scheduld

## 2016-05-08 NOTE — Telephone Encounter (Signed)
Patient wants to know if he needs to keep seeing Dr.Branch since he has been referred to [facility]. / tg

## 2016-05-08 NOTE — Telephone Encounter (Signed)
Pt should continue with cardiologist as we sent him to vascular surgeon for carotid artery disease and possible intervention. LM for pt to call back-cc

## 2016-05-09 NOTE — Telephone Encounter (Signed)
Please review for refill. Thanks!  

## 2016-05-31 ENCOUNTER — Encounter: Payer: Self-pay | Admitting: Cardiology

## 2016-05-31 ENCOUNTER — Ambulatory Visit (INDEPENDENT_AMBULATORY_CARE_PROVIDER_SITE_OTHER): Payer: Medicare HMO | Admitting: Cardiology

## 2016-05-31 VITALS — BP 158/78 | HR 90 | Ht 69.0 in | Wt 211.0 lb

## 2016-05-31 DIAGNOSIS — I6523 Occlusion and stenosis of bilateral carotid arteries: Secondary | ICD-10-CM

## 2016-05-31 DIAGNOSIS — I2510 Atherosclerotic heart disease of native coronary artery without angina pectoris: Secondary | ICD-10-CM | POA: Diagnosis not present

## 2016-05-31 DIAGNOSIS — E785 Hyperlipidemia, unspecified: Secondary | ICD-10-CM | POA: Diagnosis not present

## 2016-05-31 DIAGNOSIS — I10 Essential (primary) hypertension: Secondary | ICD-10-CM | POA: Diagnosis not present

## 2016-05-31 NOTE — Patient Instructions (Addendum)
Your physician wants you to follow-up in: 1 year Dr Bryna Colander will receive a reminder letter in the mail two months in advance. If you don't receive a letter, please call our office to schedule the follow-up appointment.   Your physician has requested that you have a carotid duplex in MAY 2018. This test is an ultrasound of the carotid arteries in your neck. It looks at blood flow through these arteries that supply the brain with blood. Allow one hour for this exam. There are no restrictions or special instructions.       Thank you for choosing [facility] !

## 2016-05-31 NOTE — Progress Notes (Signed)
Clinical Summary [name] is a 70 y.o.male seen today for follow up of the following medical problems.   1. HTN - does not check at home. He reports he is compliant with meds   2. Carotid stenosis - [id number] RICA, LICA [id number]. No history of stroke - has been on ASA and plavix, presume due to his history cerebrovascular disease - followed by vascular. He wants to follow here for serial imaging.   3. Hyperlipidemia - compliant with crestor - 08/2015 TC 127 TG 288 HDL 27 LDL 42  4. OSA - compliant with CPAP  5. Prostate CA - history of prior seed implant, reports cancer free  6. CAD - incidental findings form prior chest CT in 2013. Do not see a previous functional assessment for ischemia. The patient has not had symptoms of angina - no recent chest pain   7. Leg pains - occurs at rest or with activity.  - he has not been interested in ABIS  8. AAA screen - male over 55 former smoker - 02/2014 CT showed no aneurysm Past Medical History:  Diagnosis Date  . Bladder cancer ([place])    hx turbt in 2004  . Cervical fusion syndrome    LEFT ARM NUMBNESS--  CONTROLLED W/ GABAPENTIN  . Coronary artery disease    CARDIOLOGIST-- DR Rockey Situ [name])  . DDD (degenerative disc disease), cervical   . DDD (degenerative disc disease), lumbar   . Diverticulosis   . History of prostate cancer   . Hyperlipidemia   . Hypertension   . Leiomyoma    colon  . Meningitis   . Nocturia   . OA (osteoarthritis)   . Occlusion and stenosis of carotid artery without mention of cerebral infarction CARDIOLOGIST  -- DR Rockey Situ   DUPLEX  08-05-2012  RICA  [id number]   LICA [id number]  . OSA on CPAP    BiPAP  . Spondylosis, cervical   . Type 2 diabetes mellitus ([place])   . Urgency of urination      Allergies  Allergen Reactions  . Dilaudid [Hydromorphone Hcl]   . Toradol [Ketorolac Tromethamine] Other (See Comments)    Causes hallucinations  . Lyrica [Pregabalin] Nausea Only and  Other (See Comments)    Make me feel "bad"     Current Outpatient Prescriptions  Medication Sig Dispense Refill  . allopurinol (ZYLOPRIM) 300 MG tablet Take 300 mg by mouth at bedtime.     Marland Kitchen amLODipine (NORVASC) 10 MG tablet Take 10 mg by mouth daily.    Marland Kitchen amLODipine (NORVASC) 10 MG tablet TAKE 1 TABLET BY MOUTH EVERY DAY 90 tablet 1  . aspirin 81 MG EC tablet Take 81 mg by mouth at bedtime.     . celecoxib (CELEBREX) 200 MG capsule Take 200 mg by mouth every morning.    . cholecalciferol (VITAMIN D) 1000 UNITS tablet Take 1,000 Units by mouth daily.    . clopidogrel (PLAVIX) 75 MG tablet Take 75 mg by mouth daily with breakfast.     . colchicine 0.6 MG tablet Take 0.6 mg by mouth daily as needed. For gout    . docusate sodium (COLACE) 100 MG capsule Take 200 mg by mouth daily.    Marland Kitchen dutasteride (AVODART) 0.5 MG capsule Take 0.5 mg by mouth daily.    . finasteride (PROSCAR) 5 MG tablet Take 5 mg by mouth at bedtime.    . furosemide (LASIX) 40 MG tablet Take 40 mg by mouth daily.     Marland Kitchen  gabapentin (NEURONTIN) 300 MG capsule Take 300 mg by mouth 3 (three) times daily.      Marland Kitchen HYDROcodone-acetaminophen (NORCO/VICODIN) 5-325 MG per tablet Take 1 tablet by mouth every 6 (six) hours as needed for moderate pain. Reported on 01/20/2016    . ibuprofen (ADVIL,MOTRIN) 200 MG tablet Take 800 mg by mouth every 8 (eight) hours as needed for moderate pain. Reported on 01/20/2016    . insulin detemir (LEVEMIR) 100 UNIT/ML injection Inject 25 Units into the skin at bedtime.     Marland Kitchen labetalol (NORMODYNE) 100 MG tablet Take 300 mg by mouth 2 (two) times daily. Take 3 (100 mg) tablets twice a day.    . losartan (COZAAR) 100 MG tablet TAKE 1 TABLET BY MOUTH EVERY DAY 30 tablet 3  . Naproxen Sodium (ALEVE) 220 MG CAPS Take 1 capsule by mouth daily as needed.    Marland Kitchen omega-3 acid ethyl esters (LOVAZA) 1 G capsule Take 1 g by mouth 2 (two) times daily.     . polyethylene glycol powder (GLYCOLAX/MIRALAX) powder TAKE  17MG (1CAPFUL) BY MOUTH(MIXED WITH WATER/JUICE) DAILY 527 g 0  . Potassium Gluconate 595 MG CAPS Take 1 capsule by mouth daily.    . rosuvastatin (CRESTOR) 10 MG tablet Take 10 mg by mouth every morning.     . Tamsulosin HCl (FLOMAX) 0.4 MG CAPS Take 0.4 mg by mouth at bedtime. Reported on 01/20/2016    . tiZANidine (ZANAFLEX) 4 MG tablet Take 4 mg by mouth every 6 (six) hours as needed for muscle spasms. Reported on 01/20/2016     No current facility-administered medications for this visit.      Past Surgical History:  Procedure Laterality Date  . ANTERIOR REMOVAL CAGE AND PLATE C3-C7/ CORPECTOMY C7 (FX)/  ALLOGRAFT AND FUSION C3 -- T1/  POSTERIOR DECOMPRESSION BILATERAL LAMINECTOMY C4 -- 6 & PARTIAL C3/  POSTEROLATERAL ARTHRODESIS C3 - T1  06-05-2005  . APPENDECTOMY  1990'S  . CATARACT EXTRACTION W/ INTRAOCULAR LENS  IMPLANT, BILATERAL  2013  . CERVICAL FUSION  05-11-2005   C3  --  C7  . COLONOSCOPY  04/2011  . CYSTOSCOPY W/ RETROGRADES Bilateral 08/19/2013   Procedure: CYSTOSCOPY WITH RETROGRADE PYELOGRAM;  Surgeon: Molli Hazard, MD;  Location: [facility];  Service: Urology;  Laterality: Bilateral;  . ENDOSCOPIC REPAIR CSF LEAK VIA NASAL PASSAGE  2011  . INTRAOPERATIVE ARTERIOGRAM  CATH LAB  01-05-2009  DR Einar Gip   RICA   ACUTE ANGLE 80-85% STENOSIS  . LUMBAR FUSION  03-26-2012   L3 --  L5  . RADIOACTIVE PROSTATE SEED IMPLANTS  08-19-2003  . SEPTOPLASTY  1998  . TRANSURETHRAL RESECTION OF BLADDER TUMOR N/A 08/19/2013   Procedure: TRANSURETHRAL RESECTION OF BLADDER TUMOR (TURBT);  Surgeon: Molli Hazard, MD;  [facility];  Service: Urology;  Laterality: N/A;  . YAG LASER APPLICATION  [id number]   Procedure: YAG LASER APPLICATION;  Surgeon: Elta Guadeloupe T. Gershon Crane, MD;  Location: [facility];  Service: Ophthalmology;  Laterality: Right;     Allergies  Allergen Reactions  . Dilaudid [Hydromorphone Hcl]   . Toradol [Ketorolac Tromethamine] Other  (See Comments)    Causes hallucinations  . Lyrica [Pregabalin] Nausea Only and Other (See Comments)    Make me feel "bad"      Family History  Problem Relation Age of Onset  . Cancer Sister     male organs  . Diabetes Sister     family hx  . Arthritis Other  entire family  . Breast cancer Mother     mets  . Heart disease Father     MI  . Colon cancer Brother   . Diabetes Brother     x 2  . Diabetes Mother      Social History [name] reports that he quit smoking about 7 months ago. His smoking use included Cigarettes. He has a 40.00 pack-year smoking history. He has never used smokeless tobacco. [name] reports that he does not drink alcohol.   Review of Systems CONSTITUTIONAL: No weight loss, fever, chills, weakness or fatigue.  HEENT: Eyes: No visual loss, blurred vision, double vision or yellow sclerae.No hearing loss, sneezing, congestion, runny nose or sore throat.  SKIN: No rash or itching.  CARDIOVASCULAR: per HPI RESPIRATORY: No shortness of breath, cough or sputum.  GASTROINTESTINAL: No anorexia, nausea, vomiting or diarrhea. No abdominal pain or blood.  GENITOURINARY: No burning on urination, no polyuria NEUROLOGICAL: No headache, dizziness, syncope, paralysis, ataxia, numbness or tingling in the extremities. No change in bowel or bladder control.  MUSCULOSKELETAL: +leg pains LYMPHATICS: No enlarged nodes. No history of splenectomy.  PSYCHIATRIC: No history of depression or anxiety.  ENDOCRINOLOGIC: No reports of sweating, cold or heat intolerance. No polyuria or polydipsia.  Marland Kitchen   Physical Examination Vitals:   05/31/16 1344  BP: (!) 158/78  Pulse: 90   Vitals:   05/31/16 1344  Weight: 211 lb (95.7 kg)  Height: 5\' 9"  (1.753 m)   Manual bp: 138/75  Gen: resting comfortably, no acute distress HEENT: no scleral icterus, pupils equal round and reactive, no palptable cervical adenopathy,  CV: RRR, no m/r/g, no jvd Resp: Clear to auscultation  bilaterally GI: abdomen is soft, non-tender, non-distended, normal bowel sounds, no hepatosplenomegaly MSK: extremities are warm, no edema.  Skin: warm, no rash Neuro:  no focal deficits Psych: appropriate affect     Assessment and Plan  1. HTN - manual bp in clinic at goal, we will conitnue current meds  2. Carotid stenosis - continue serial imaging, repeat 01/2017  3. Hyperlipidemia - continue crestor, request pcp labs  4. CAD - incidental finding on CT scan, he has not had any cardiac symptoms - continue risk factor modificatoin - on ASA and plavix, I presume due to his history of cerebrovascular disease. From records no cardiac indication. - continue current meds. EKG in clinic shows SR, no ischemic changes   F/u 6 months      Arnoldo Lenis, M.D., F.A.C.C.

## 2016-07-12 ENCOUNTER — Other Ambulatory Visit: Payer: Self-pay | Admitting: Urology

## 2016-07-12 DIAGNOSIS — C670 Malignant neoplasm of trigone of bladder: Secondary | ICD-10-CM | POA: Diagnosis not present

## 2016-07-12 DIAGNOSIS — N401 Enlarged prostate with lower urinary tract symptoms: Secondary | ICD-10-CM | POA: Diagnosis not present

## 2016-07-12 DIAGNOSIS — C61 Malignant neoplasm of prostate: Secondary | ICD-10-CM | POA: Diagnosis not present

## 2016-07-12 DIAGNOSIS — R3915 Urgency of urination: Secondary | ICD-10-CM | POA: Diagnosis not present

## 2016-07-13 ENCOUNTER — Encounter (HOSPITAL_BASED_OUTPATIENT_CLINIC_OR_DEPARTMENT_OTHER): Payer: Self-pay | Admitting: *Deleted

## 2016-07-13 NOTE — Progress Notes (Addendum)
SPOKE W/ PT WIFE,  PT HOH.  NPO AFTER MN.  ARRIVE AT 1015.  NEEDS ISTAT 8.  CURRENT EKG IN CHART AND EPIC.  WILL TAKE GABAPENTIN, CRESTOR, NORVASC, AND LABETALOL AM DOS W/ SIPS OF WATER.  WILL DO HALF DOSE LEVEMIR INSULIN HS BEFORE DOS.  ADDENDUM:  REVIEWED CHART W/ DR OSSEY MDA, OK TO PROCEED.

## 2016-07-18 ENCOUNTER — Ambulatory Visit (HOSPITAL_BASED_OUTPATIENT_CLINIC_OR_DEPARTMENT_OTHER): Payer: Medicare HMO | Admitting: Anesthesiology

## 2016-07-18 ENCOUNTER — Encounter (HOSPITAL_BASED_OUTPATIENT_CLINIC_OR_DEPARTMENT_OTHER)
Admission: RE | Disposition: A | Discharge status: Home / Self Care | Payer: Self-pay | Source: Ambulatory Visit | Attending: Urology

## 2016-07-18 ENCOUNTER — Ambulatory Visit (HOSPITAL_BASED_OUTPATIENT_CLINIC_OR_DEPARTMENT_OTHER)
Admission: RE | Admit: 2016-07-18 | Discharge: 2016-07-18 | Disposition: A | Discharge status: Home / Self Care | Payer: Medicare HMO | Source: Ambulatory Visit | Attending: Urology | Admitting: Urology

## 2016-07-18 ENCOUNTER — Encounter (HOSPITAL_BASED_OUTPATIENT_CLINIC_OR_DEPARTMENT_OTHER): Payer: Self-pay

## 2016-07-18 DIAGNOSIS — Z833 Family history of diabetes mellitus: Secondary | ICD-10-CM | POA: Insufficient documentation

## 2016-07-18 DIAGNOSIS — M5136 Other intervertebral disc degeneration, lumbar region: Secondary | ICD-10-CM | POA: Diagnosis not present

## 2016-07-18 DIAGNOSIS — Z800 Family history of malignant neoplasm of digestive organs: Secondary | ICD-10-CM | POA: Diagnosis not present

## 2016-07-18 DIAGNOSIS — N3289 Other specified disorders of bladder: Secondary | ICD-10-CM | POA: Diagnosis not present

## 2016-07-18 DIAGNOSIS — I739 Peripheral vascular disease, unspecified: Secondary | ICD-10-CM | POA: Diagnosis not present

## 2016-07-18 DIAGNOSIS — G4730 Sleep apnea, unspecified: Secondary | ICD-10-CM | POA: Insufficient documentation

## 2016-07-18 DIAGNOSIS — C670 Malignant neoplasm of trigone of bladder: Secondary | ICD-10-CM | POA: Insufficient documentation

## 2016-07-18 DIAGNOSIS — I10 Essential (primary) hypertension: Secondary | ICD-10-CM | POA: Diagnosis not present

## 2016-07-18 DIAGNOSIS — Z981 Arthrodesis status: Secondary | ICD-10-CM | POA: Diagnosis not present

## 2016-07-18 DIAGNOSIS — G4733 Obstructive sleep apnea (adult) (pediatric): Secondary | ICD-10-CM | POA: Insufficient documentation

## 2016-07-18 DIAGNOSIS — E119 Type 2 diabetes mellitus without complications: Secondary | ICD-10-CM | POA: Insufficient documentation

## 2016-07-18 DIAGNOSIS — I2510 Atherosclerotic heart disease of native coronary artery without angina pectoris: Secondary | ICD-10-CM | POA: Insufficient documentation

## 2016-07-18 DIAGNOSIS — M109 Gout, unspecified: Secondary | ICD-10-CM | POA: Diagnosis not present

## 2016-07-18 DIAGNOSIS — M5030 Other cervical disc degeneration, unspecified cervical region: Secondary | ICD-10-CM | POA: Diagnosis not present

## 2016-07-18 DIAGNOSIS — H9190 Unspecified hearing loss, unspecified ear: Secondary | ICD-10-CM | POA: Diagnosis not present

## 2016-07-18 DIAGNOSIS — M1990 Unspecified osteoarthritis, unspecified site: Secondary | ICD-10-CM | POA: Insufficient documentation

## 2016-07-18 DIAGNOSIS — E785 Hyperlipidemia, unspecified: Secondary | ICD-10-CM | POA: Diagnosis not present

## 2016-07-18 DIAGNOSIS — Z803 Family history of malignant neoplasm of breast: Secondary | ICD-10-CM | POA: Insufficient documentation

## 2016-07-18 DIAGNOSIS — Z87891 Personal history of nicotine dependence: Secondary | ICD-10-CM | POA: Diagnosis not present

## 2016-07-18 DIAGNOSIS — Z8546 Personal history of malignant neoplasm of prostate: Secondary | ICD-10-CM | POA: Diagnosis not present

## 2016-07-18 DIAGNOSIS — N400 Enlarged prostate without lower urinary tract symptoms: Secondary | ICD-10-CM | POA: Insufficient documentation

## 2016-07-18 DIAGNOSIS — Z888 Allergy status to other drugs, medicaments and biological substances status: Secondary | ICD-10-CM | POA: Insufficient documentation

## 2016-07-18 DIAGNOSIS — Z9841 Cataract extraction status, right eye: Secondary | ICD-10-CM | POA: Insufficient documentation

## 2016-07-18 DIAGNOSIS — D494 Neoplasm of unspecified behavior of bladder: Secondary | ICD-10-CM | POA: Diagnosis not present

## 2016-07-18 DIAGNOSIS — Z86010 Personal history of colonic polyps: Secondary | ICD-10-CM | POA: Insufficient documentation

## 2016-07-18 DIAGNOSIS — C679 Malignant neoplasm of bladder, unspecified: Secondary | ICD-10-CM | POA: Diagnosis not present

## 2016-07-18 DIAGNOSIS — Z9842 Cataract extraction status, left eye: Secondary | ICD-10-CM | POA: Diagnosis not present

## 2016-07-18 DIAGNOSIS — Z8261 Family history of arthritis: Secondary | ICD-10-CM | POA: Diagnosis not present

## 2016-07-18 HISTORY — PX: CYSTOSCOPY W/ RETROGRADES: SHX1426

## 2016-07-18 HISTORY — DX: Diverticulosis of large intestine without perforation or abscess without bleeding: K57.30

## 2016-07-18 HISTORY — DX: Unspecified hearing loss, unspecified ear: H91.90

## 2016-07-18 HISTORY — DX: Personal history of other diseases of the musculoskeletal system and connective tissue: Z87.39

## 2016-07-18 HISTORY — DX: Personal history of adenomatous and serrated colon polyps: Z86.0101

## 2016-07-18 HISTORY — DX: Cyst of kidney, acquired: N28.1

## 2016-07-18 HISTORY — DX: Presence of spectacles and contact lenses: Z97.3

## 2016-07-18 HISTORY — DX: Benign prostatic hyperplasia without lower urinary tract symptoms: N40.0

## 2016-07-18 HISTORY — PX: TRANSURETHRAL RESECTION OF BLADDER TUMOR: SHX2575

## 2016-07-18 HISTORY — DX: Personal history of colonic polyps: Z86.010

## 2016-07-18 LAB — POCT I-STAT, CHEM 8
BUN: 24 mg/dL — AB (ref 6–20)
CALCIUM ION: 1.27 mmol/L (ref 1.15–1.40)
CHLORIDE: 104 mmol/L (ref 101–111)
Creatinine, Ser: 1 mg/dL (ref 0.61–1.24)
Glucose, Bld: 156 mg/dL — ABNORMAL HIGH (ref 65–99)
HEMATOCRIT: 39 % (ref 39.0–52.0)
Hemoglobin: 13.3 g/dL (ref 13.0–17.0)
POTASSIUM: 4.4 mmol/L (ref 3.5–5.1)
SODIUM: 142 mmol/L (ref 135–145)
TCO2: 27 mmol/L (ref 0–100)

## 2016-07-18 LAB — GLUCOSE, CAPILLARY: Glucose-Capillary: 173 mg/dL — ABNORMAL HIGH (ref 65–99)

## 2016-07-18 SURGERY — TURBT (TRANSURETHRAL RESECTION OF BLADDER TUMOR)
Anesthesia: General | Site: Renal

## 2016-07-18 MED ORDER — PROPOFOL 10 MG/ML IV BOLUS
INTRAVENOUS | Status: DC | PRN
  Administered 2016-07-18: 170 mg via INTRAVENOUS

## 2016-07-18 MED ORDER — FENTANYL CITRATE (PF) 100 MCG/2ML IJ SOLN
INTRAMUSCULAR | Status: AC
  Filled 2016-07-18: qty 2

## 2016-07-18 MED ORDER — DEXAMETHASONE SODIUM PHOSPHATE 10 MG/ML IJ SOLN
INTRAMUSCULAR | Status: AC
  Filled 2016-07-18: qty 1

## 2016-07-18 MED ORDER — SODIUM CHLORIDE 0.9 % IR SOLN
Status: DC | PRN
  Administered 2016-07-18: 6000 mL

## 2016-07-18 MED ORDER — GENTAMICIN SULFATE 40 MG/ML IJ SOLN
5.0000 mg/kg | INTRAVENOUS | Status: AC
  Administered 2016-07-18: 410 mg via INTRAVENOUS
  Filled 2016-07-18 (×2): qty 10.25

## 2016-07-18 MED ORDER — LIDOCAINE 2% (20 MG/ML) 5 ML SYRINGE
INTRAMUSCULAR | Status: AC
  Filled 2016-07-18: qty 5

## 2016-07-18 MED ORDER — OXYCODONE-ACETAMINOPHEN 5-325 MG PO TABS
ORAL_TABLET | ORAL | Status: AC
  Filled 2016-07-18: qty 1

## 2016-07-18 MED ORDER — EPHEDRINE SULFATE-NACL 50-0.9 MG/10ML-% IV SOSY
PREFILLED_SYRINGE | INTRAVENOUS | Status: DC | PRN
  Administered 2016-07-18 (×4): 10 mg via INTRAVENOUS

## 2016-07-18 MED ORDER — LACTATED RINGERS IV SOLN
INTRAVENOUS | Status: DC
  Administered 2016-07-18: 11:00:00 via INTRAVENOUS
  Filled 2016-07-18: qty 1000

## 2016-07-18 MED ORDER — IOHEXOL 300 MG/ML  SOLN
INTRAMUSCULAR | Status: DC | PRN
  Administered 2016-07-18: 9 mL

## 2016-07-18 MED ORDER — DEXAMETHASONE SODIUM PHOSPHATE 4 MG/ML IJ SOLN
INTRAMUSCULAR | Status: DC | PRN
  Administered 2016-07-18: 5 mg via INTRAVENOUS

## 2016-07-18 MED ORDER — OXYCODONE-ACETAMINOPHEN 5-325 MG PO TABS
1.0000 | ORAL_TABLET | Freq: Once | ORAL | Status: DC
Start: 2016-07-18 — End: 2016-07-18
  Filled 2016-07-18: qty 1

## 2016-07-18 MED ORDER — FENTANYL CITRATE (PF) 100 MCG/2ML IJ SOLN
INTRAMUSCULAR | Status: DC | PRN
Start: 2016-07-18 — End: 2016-07-18
  Administered 2016-07-18: 50 ug via INTRAVENOUS

## 2016-07-18 MED ORDER — FENTANYL CITRATE (PF) 100 MCG/2ML IJ SOLN
25.0000 ug | INTRAMUSCULAR | Status: DC | PRN
  Administered 2016-07-18: 25 ug via INTRAVENOUS
  Filled 2016-07-18: qty 1

## 2016-07-18 MED ORDER — OXYCODONE-ACETAMINOPHEN 5-325 MG PO TABS: 1.0000 | ORAL_TABLET | Freq: Four times a day (QID) | ORAL | 0 refills | Status: DC | PRN

## 2016-07-18 MED ORDER — LIDOCAINE HCL (CARDIAC) 20 MG/ML IV SOLN
INTRAVENOUS | Status: DC | PRN
  Administered 2016-07-18: 100 mg via INTRAVENOUS

## 2016-07-18 MED ORDER — GENTAMICIN IN SALINE 1.6-0.9 MG/ML-% IV SOLN
80.0000 mg | INTRAVENOUS | Status: DC
  Filled 2016-07-18: qty 50

## 2016-07-18 MED ORDER — ONDANSETRON HCL 4 MG/2ML IJ SOLN
INTRAMUSCULAR | Status: AC
  Filled 2016-07-18: qty 2

## 2016-07-18 MED ORDER — PROPOFOL 10 MG/ML IV BOLUS
INTRAVENOUS | Status: AC
  Filled 2016-07-18: qty 40

## 2016-07-18 MED ORDER — SENNOSIDES-DOCUSATE SODIUM 8.6-50 MG PO TABS: 1.0000 | ORAL_TABLET | Freq: Two times a day (BID) | ORAL | 0 refills | Status: DC

## 2016-07-18 MED ORDER — CEPHALEXIN 500 MG PO CAPS: 500.0000 mg | ORAL_CAPSULE | Freq: Two times a day (BID) | ORAL | 0 refills | Status: AC

## 2016-07-18 SURGICAL SUPPLY — 35 items
BAG DRAIN URO-CYSTO SKYTR STRL (DRAIN) ×3 IMPLANT
BAG URINE DRAINAGE (UROLOGICAL SUPPLIES) IMPLANT
BAG URINE LEG 19OZ MD ST LTX (BAG) ×3 IMPLANT
BAG URINE LEG 500ML (DRAIN) ×3 IMPLANT
BASKET ZERO TIP NITINOL 2.4FR (BASKET) IMPLANT
CATH FOLEY 2WAY SLVR  5CC 22FR (CATHETERS)
CATH FOLEY 2WAY SLVR 30CC 20FR (CATHETERS) IMPLANT
CATH FOLEY 2WAY SLVR 5CC 22FR (CATHETERS) IMPLANT
CATH HEMA 3WAY 30CC 24FR COUDE (CATHETERS) ×3 IMPLANT
CATH INTERMIT  6FR 70CM (CATHETERS) IMPLANT
CLOTH BEACON ORANGE TIMEOUT ST (SAFETY) ×3 IMPLANT
ELECT REM PT RETURN 9FT ADLT (ELECTROSURGICAL)
ELECTRODE REM PT RTRN 9FT ADLT (ELECTROSURGICAL) IMPLANT
EVACUATOR MICROVAS BLADDER (UROLOGICAL SUPPLIES) IMPLANT
GLOVE BIO SURGEON STRL SZ 6.5 (GLOVE) ×3 IMPLANT
GLOVE BIO SURGEON STRL SZ7.5 (GLOVE) ×3 IMPLANT
GOWN STRL REUS W/ TWL LRG LVL3 (GOWN DISPOSABLE) ×4 IMPLANT
GOWN STRL REUS W/TWL LRG LVL3 (GOWN DISPOSABLE) ×8 IMPLANT
GUIDEWIRE ANG ZIPWIRE 038X150 (WIRE) IMPLANT
GUIDEWIRE STR DUAL SENSOR (WIRE) ×3 IMPLANT
HOLDER FOLEY CATH W/STRAP (MISCELLANEOUS) ×3 IMPLANT
IV NS 1000ML (IV SOLUTION) ×1
IV NS 1000ML BAXH (IV SOLUTION) ×2 IMPLANT
IV NS IRRIG 3000ML ARTHROMATIC (IV SOLUTION) ×3 IMPLANT
KIT ROOM TURNOVER WOR (KITS) ×3 IMPLANT
LOOP CUT BIPOLAR 24F LRG (ELECTROSURGICAL) ×3 IMPLANT
MANIFOLD NEPTUNE II (INSTRUMENTS) ×3 IMPLANT
NS IRRIG 500ML POUR BTL (IV SOLUTION) ×3 IMPLANT
PACK CYSTO (CUSTOM PROCEDURE TRAY) ×3 IMPLANT
SET ASPIRATION TUBING (TUBING) IMPLANT
SYR 30ML LL (SYRINGE) ×3 IMPLANT
SYRINGE 10CC LL (SYRINGE) ×3 IMPLANT
SYRINGE IRR TOOMEY STRL 70CC (SYRINGE) IMPLANT
TUBE CONNECTING 12X1/4 (SUCTIONS) IMPLANT
TUBE FEEDING 8FR 16IN STR KANG (MISCELLANEOUS) IMPLANT

## 2016-07-18 NOTE — Transfer of Care (Signed)
Immediate Anesthesia Transfer of Care Note  Patient: [name]  Procedure(s) Performed: Procedure(s): TRANSURETHRAL RESECTION OF BLADDER TUMOR (TURBT) (N/A) CYSTOSCOPY WITH RETROGRADE PYELOGRAM (Bilateral)  Patient Location: PACU  Anesthesia Type:General  Level of Consciousness: awake, alert  and oriented  Airway & Oxygen Therapy: Patient Spontanous Breathing and Patient connected to nasal cannula oxygen  Post-op Assessment: Report given to RN  Post vital signs: Reviewed and stable  Last Vitals: 151/76, 68, 16, 99%, 98.5  Vitals:   07/18/16 1024  BP: (!) 163/83  Pulse: 79  Resp: 16  Temp: 36.8 C    Last Pain:  Vitals:   07/18/16 1024  TempSrc: Oral      Patients Stated Pain Goal: 7 ([phone] [id number])  Complications: No apparent anesthesia complications

## 2016-07-18 NOTE — Anesthesia Postprocedure Evaluation (Signed)
Anesthesia Post Note  Patient: [name]  Procedure(s) Performed: Procedure(s) (LRB): TRANSURETHRAL RESECTION OF BLADDER TUMOR (TURBT) (N/A) CYSTOSCOPY WITH RETROGRADE PYELOGRAM (Bilateral)  Patient location during evaluation: PACU Anesthesia Type: General Level of consciousness: awake Pain management: pain level controlled Vital Signs Assessment: post-procedure vital signs reviewed and stable Cardiovascular status: stable Anesthetic complications: no    Last Vitals:  Vitals:   07/18/16 1245 07/18/16 1300  BP: (!) 158/80 (!) 171/89  Pulse: 66 73  Resp: 12 13  Temp:      Last Pain:  Vitals:   07/18/16 1300  TempSrc:   PainSc: 3                  EDWARDS,Javionna Leder

## 2016-07-18 NOTE — H&P (Signed)
[name] is an 70 y.o. male.    Chief Complaint: Pre-op Transurethral Resection of BLadder TUmor  HPI:   1 -Recurrent Low Grade Bladder Cancer -   2004 - TaG1 TURBT  08/2013 TaG1 TURBT, CT Urogram clinically localized; 06/2014 cysto NED  12/2014 cysto NED; 06/2015 cysto NED  06/2016 - cysto --> small volume recurrent near Rt trigone / neck and Left wall. Total area likely 3cm.    PMH sig for CAD/Plavix, Brain surgery x2, back surgery x 5. His PCP is [facility] in [place].    Today [name] is seen to proceed with TURBT for his bladder cancer recurrence. Most recent UA without infectious parameters.    Past Medical History:  Diagnosis Date  . Bilateral renal cysts   . Bladder cancer ([employer]) UROLOGIST-  DR [facility]   RECURRENT 11/ 2017 --  hx turbt in 2004 and 08-19-2013  . BPH without obstruction/lower urinary tract symptoms   . Cervical fusion syndrome    LEFT ARM NUMBNESS--  CONTROLLED W/ GABAPENTIN  . Coronary artery disease    CARDIOLOGIST-- DR BRANCH [name])  . DDD (degenerative disc disease), cervical   . DDD (degenerative disc disease), lumbar   . Diverticulosis of sigmoid colon   . History of acute gouty arthritis   . History of adenomatous polyp of colon    2001;  2012- non-malignant leiomyoma;  04-07-2014  tubualr adenoma and hyperplastic polyp's  . History of prostate cancer UROLOGIST-  DR [facility]   S/P  RADIOACTIVE SEED IMPLANTS 2004  . HOH (hard of hearing)    no hearing aids  . Hyperlipidemia   . Hypertension   . OA (osteoarthritis)   . Occlusion and stenosis of carotid artery without mention of cerebral infarction CARDIOLOGIST  -- DR  [name]   LAST DUPLEX  11-23-2015  RICA  (DUPLEX DOPPLER 01-20-2016 RICA [id number] PROXIMAL) /   LICA [id number] (HAD CONSULT W/ [name] , VASCULAR SURGEON 01-20-2016)  . OSA on CPAP    BiPAP  . Spondylosis, cervical   . Type 2 diabetes mellitus ([place])   . Wears glasses     Past Surgical History:  Procedure  Laterality Date  . ANTERIOR REMOVAL CAGE AND PLATE C3-C7/ CORPECTOMY C7 (FX)/  ALLOGRAFT AND FUSION C3 -- T1/  POSTERIOR DECOMPRESSION BILATERAL LAMINECTOMY C4 -- 6 & PARTIAL C3/  POSTEROLATERAL ARTHRODESIS C3 - T1  06/05/2005  . APPENDECTOMY  1990'S  . CATARACT EXTRACTION W/ INTRAOCULAR LENS  IMPLANT, BILATERAL  2013  . CERVICAL FUSION  05/11/2005   C3  --  C7/  due to post op quadriparesis same day s/p  anterior C 4,5,6, colpectomy, decompression, removal epidural hematoma, foraminnotomy, cage and plate  . COLONOSCOPY  last one 04-07-2014  . CYSTOSCOPY W/ RETROGRADES Bilateral 08/19/2013   Procedure: CYSTOSCOPY WITH RETROGRADE PYELOGRAM;  Surgeon: Molli Hazard, MD;  Location: [facility];  Service: Urology;  Laterality: Bilateral;  . ENDOSCOPIC REPAIR CSF LEAK VIA NASAL PASSAGE  2011  . INTRAOPERATIVE ARTERIOGRAM  CATH LAB  01-05-2009  DR Einar Gip   RICA   ACUTE ANGLE 80-85% STENOSIS  . LUMBAR FUSION  03-26-2012   L3 --  L5  . RADIOACTIVE PROSTATE SEED IMPLANTS  08-19-2003  . SEPTOPLASTY  1998  . TRANSURETHRAL RESECTION OF BLADDER TUMOR N/A 08/19/2013   Procedure: TRANSURETHRAL RESECTION OF BLADDER TUMOR (TURBT);  Surgeon: Molli Hazard, MD;  Location: [facility];  Service: Urology;  Laterality: N/A;  . YAG  LASER APPLICATION  [id number]   Procedure: YAG LASER APPLICATION;  Surgeon: Elta Guadeloupe T. Gershon Crane, MD;  Location: [facility];  Service: Ophthalmology;  Laterality: Right;    Family History  Problem Relation Age of Onset  . Breast cancer Mother     mets  . Diabetes Mother   . Heart disease Father     MI  . Colon cancer Brother   . Cancer Sister     male organs  . Diabetes Sister     family hx  . Arthritis Other     entire family  . Diabetes Brother     x 2   Social History:  reports that he quit smoking about 2 months ago. His smoking use included Cigarettes. He has a 94.00 pack-year smoking history. He has never used smokeless tobacco. He  reports that he does not drink alcohol or use drugs.  Allergies:  Allergies  Allergen Reactions  . Dilaudid [Hydromorphone Hcl] Other (See Comments)    "acts a little off"  . Toradol [Ketorolac Tromethamine] Other (See Comments)    Causes hallucinations  . Lyrica [Pregabalin] Nausea Only and Other (See Comments)    Make me feel "bad"    No prescriptions prior to admission.    No results found for this or any previous visit (from the past 48 hour(s)). No results found.  Review of Systems  Constitutional: Negative.   HENT: Negative.   Eyes: Negative.   Respiratory: Negative.   Cardiovascular: Negative.   Gastrointestinal: Negative.   Genitourinary: Negative.  Negative for flank pain and hematuria.  Musculoskeletal: Negative.   Skin: Negative.   Neurological: Negative.   Endo/Heme/Allergies: Negative.   Psychiatric/Behavioral: Negative.     Height 5\' 9"  (1.753 m), weight 95.7 kg (211 lb). Physical Exam  Constitutional: He appears well-developed.  HENT:  Head: Normocephalic.  Eyes: Pupils are equal, round, and reactive to light.  Neck: Normal range of motion.  Cardiovascular: Normal rate.   Respiratory: Effort normal.  GI: Soft.  Genitourinary:  Genitourinary Comments: No CVAT  Musculoskeletal: Normal range of motion.  Neurological: He is alert.  Skin: Skin is warm.  Psychiatric: He has a normal mood and affect.     Assessment/Plan  1 -Recurrent Low Grade Bladder Cancer - proceed as planned with TURBT / retrogrades. Risks, benefits, need for possible peri-op ureteral stents and catheters discussed as well as likely peri-op course.    Alexis Frock, MD 07/18/2016, 8:03 AM

## 2016-07-18 NOTE — Discharge Instructions (Addendum)
1 - You may have urinary urgency (bladder spasms) and bloody urine on / off with catheter in place. This is normal.  2 - Call MD or go to ER for fever >102, severe pain / nausea / vomiting not relieved by medications, or acute change in medical status  3.   Resume plavix   on Tuesday.  Post Anesthesia Home Care Instructions  Activity: Get plenty of rest for the remainder of the day. A responsible adult should stay with you for 24 hours following the procedure.  For the next 24 hours, DO NOT: -Drive a car -Paediatric nurse -Drink alcoholic beverages -Take any medication unless instructed by your physician -Make any legal decisions or sign important papers.  Meals: Start with liquid foods such as gelatin or soup. Progress to regular foods as tolerated. Avoid greasy, spicy, heavy foods. If nausea and/or vomiting occur, drink only clear liquids until the nausea and/or vomiting subsides. Call your physician if vomiting continues.  Special Instructions/Symptoms: Your throat may feel dry or sore from the anesthesia or the breathing tube placed in your throat during surgery. If this causes discomfort, gargle with warm salt water. The discomfort should disappear within 24 hours.  If you had a scopolamine patch placed behind your ear for the management of post- operative nausea and/or vomiting:  1. The medication in the patch is effective for 72 hours, after which it should be removed.  Wrap patch in a tissue and discard in the trash. Wash hands thoroughly with soap and water. 2. You may remove the patch earlier than 72 hours if you experience unpleasant side effects which may include dry mouth, dizziness or visual disturbances. 3. Avoid touching the patch. Wash your hands with soap and water after contact with the patch.    Foley Catheter Care, Adult A Foley catheter is a soft, flexible tube. This tube is placed into your bladder to drain pee (urine). If you go home with this catheter in  place, follow the instructions below. TAKING CARE OF THE CATHETER 1. Wash your hands with soap and water. 2. Put soap and water on a clean washcloth.  Clean the skin where the tube goes into your body.  Clean away from the tube site.  Never wipe toward the tube.  Clean the area using a circular motion.  Remove all the soap. Pat the area dry with a clean towel. For males, reposition the skin that covers the end of the penis (foreskin). 3. Attach the tube to your leg with tape or a leg strap. Do not stretch the tube tight. If you are using tape, remove any stickiness left behind by past tape you used. 4. Keep the drainage bag below your hips. Keep it off the floor. 5. Check your tube during the day. Make sure it is working and draining. Make sure the tube does not curl, twist, or bend. 6. Do not pull on the tube or try to take it out. TAKING CARE OF THE DRAINAGE BAGS You will have a large overnight drainage bag and a small leg bag. You may wear the overnight bag any time. Never wear the small bag at night. Follow the directions below. Emptying the Drainage Bag Empty your drainage bag when it is  - full or at least 2-3 times a day. 1. Wash your hands with soap and water. 2. Keep the drainage bag below your hips. 3. Hold the dirty bag over the toilet or clean container. 4. Open the pour spout at the  bottom of the bag. Empty the pee into the toilet or container. Do not let the pour spout touch anything. 5. Clean the pour spout with a gauze pad or cotton ball that has rubbing alcohol on it. 6. Close the pour spout. 7. Attach the bag to your leg with tape or a leg strap. 8. Wash your hands well. Changing the Drainage Bag Change your bag once a month or sooner if it starts to smell or look dirty.  1. Wash your hands with soap and water. 2. Pinch the rubber tube so that pee does not spill out. 3. Disconnect the catheter tube from the drainage tube at the connection valve. Do not let the  tubes touch anything. 4. Clean the end of the catheter tube with an alcohol wipe. Clean the end of a the drainage tube with a different alcohol wipe. 5. Connect the catheter tube to the drainage tube of the clean drainage bag. 6. Attach the new bag to the leg with tape or a leg strap. Avoid attaching the new bag too tightly. 7. Wash your hands well. Cleaning the Drainage Bag 1. Wash your hands with soap and water. 2. Wash the bag in warm, soapy water. 3. Rinse the bag with warm water. 4. Fill the bag with a mixture of white vinegar and water (1 cup vinegar to 1 quart warm water [.2 liter vinegar to 1 liter warm water]). Close the bag and soak it for 30 minutes in the solution. 5. Rinse the bag with warm water. 6. Hang the bag to dry with the pour spout open and hanging downward. 7. Store the clean bag (once it is dry) in a clean plastic bag. 8. Wash your hands well. PREVENT INFECTION  Wash your hands before and after touching your tube.  Take showers every day. Wash the skin where the tube enters your body. Do not take baths. Replace wet leg straps with dry ones, if this applies.  Do not use powders, sprays, or lotions on the genital area. Only use creams, lotions, or ointments as told by your doctor.  For females, wipe from front to back after going to the bathroom.  Drink enough fluids to keep your pee clear or pale yellow unless you are told not to have too much fluid (fluid restriction).  Do not let the drainage bag or tubing touch or lie on the floor.  Wear cotton underwear to keep the area dry. GET HELP IF:  Your pee is cloudy or smells unusually bad.  Your tube becomes clogged.  You are not draining pee into the bag or your bladder feels full.  Your tube starts to leak. GET HELP RIGHT AWAY IF:  You have pain, puffiness (swelling), redness, or yellowish-white fluid (pus) where the tube enters the body.  You have pain in the belly (abdomen), legs, lower back, or  bladder.  You have a fever.  You see blood fill the tube, or your pee is pink or red.  You feel sick to your stomach (nauseous), throw up (vomit), or have chills.  Your tube gets pulled out. MAKE SURE YOU:   Understand these instructions.  Will watch your condition.  Will get help right away if you are not doing well or get worse.   This information is not intended to replace advice given to you by your health care provider. Make sure you discuss any questions you have with your health care provider.   Document Released: 12/29/2012 Document Revised: 09/24/2014 Document Reviewed:  12/29/2012 Elsevier Interactive Patient Education [employer].

## 2016-07-18 NOTE — Anesthesia Procedure Notes (Signed)
Procedure Name: LMA Insertion Date/Time: 07/18/2016 11:44 AM Performed by: Wanita Chamberlain Pre-anesthesia Checklist: Patient identified, Timeout performed, Emergency Drugs available, Suction available and Patient being monitored Patient Re-evaluated:Patient Re-evaluated prior to inductionOxygen Delivery Method: Circle system utilized Preoxygenation: Pre-oxygenation with 100% oxygen Intubation Type: IV induction Ventilation: Mask ventilation without difficulty LMA: LMA inserted LMA Size: 5.0 Number of attempts: 1 Placement Confirmation: positive ETCO2 and breath sounds checked- equal and bilateral Tube secured with: Tape Dental Injury: Teeth and Oropharynx as per pre-operative assessment

## 2016-07-18 NOTE — Brief Op Note (Signed)
07/18/2016  12:14 PM  PATIENT:  [name]  70 y.o. male  PRE-OPERATIVE DIAGNOSIS:  RECURRENT BLADDER CANCER  POST-OPERATIVE DIAGNOSIS:  RECURRENT BLADDER CANCER  PROCEDURE:  Procedure(s): TRANSURETHRAL RESECTION OF BLADDER TUMOR (TURBT) (N/A) CYSTOSCOPY WITH RETROGRADE PYELOGRAM (Bilateral)  SURGEON:  Surgeon(s) and Role:    * Alexis Frock, MD - Primary  PHYSICIAN ASSISTANT:   ASSISTANTS: none   ANESTHESIA:   general  EBL:  Total I/O In: 250 [I.V.:250] Out: 25 [Blood:25]  BLOOD ADMINISTERED:none  DRAINS: 3way foley to gravity, irrigation port plugged   LOCAL MEDICATIONS USED:  NONE  SPECIMEN:  Source of Specimen:  1 - trigone tumor, 2 - left wall tumor  DISPOSITION OF SPECIMEN:  PATHOLOGY  COUNTS:  YES  TOURNIQUET:  * No tourniquets in log *  DICTATION: .Other Dictation: Dictation Number [id number]  PLAN OF CARE: Discharge to home after PACU  PATIENT DISPOSITION:  PACU - hemodynamically stable.   Delay start of Pharmacological VTE agent (>24hrs) due to surgical blood loss or risk of bleeding: yes

## 2016-07-18 NOTE — Anesthesia Preprocedure Evaluation (Addendum)
Anesthesia Evaluation  Patient identified by MRN, date of birth, ID band Patient awake    Reviewed: Allergy & Precautions, NPO status   History of Anesthesia Complications (+) POST - OP SPINAL HEADACHE  Airway Mallampati: II       Dental   Pulmonary sleep apnea , former smoker,    breath sounds clear to auscultation       Cardiovascular hypertension, + CAD and + Peripheral Vascular Disease   Rhythm:Regular Rate:Normal     Neuro/Psych    GI/Hepatic   Endo/Other  diabetes  Renal/GU Renal disease     Musculoskeletal   Abdominal   Peds  Hematology   Anesthesia Other Findings   Reproductive/Obstetrics                           Anesthesia Physical Anesthesia Plan  ASA: III  Anesthesia Plan: General   Post-op Pain Management:    Induction: Intravenous  Airway Management Planned: Oral ETT and LMA  Additional Equipment:   Intra-op Plan:   Post-operative Plan: Possible Post-op intubation/ventilation  Informed Consent: I have reviewed the patients History and Physical, chart, labs and discussed the procedure including the risks, benefits and alternatives for the proposed anesthesia with the patient or authorized representative who has indicated his/her understanding and acceptance.   Dental advisory given  Plan Discussed with: CRNA and Anesthesiologist  Anesthesia Plan Comments:        Anesthesia Quick Evaluation

## 2016-07-19 ENCOUNTER — Encounter (HOSPITAL_BASED_OUTPATIENT_CLINIC_OR_DEPARTMENT_OTHER): Payer: Self-pay | Admitting: Urology

## 2016-07-19 NOTE — Op Note (Signed)
[name], Ricky NO.:  [id number]  MEDICAL RECORD NO.:  [id number]  LOCATION:                                FACILITY:  [facility]  PHYSICIAN:  Alexis Frock, MD     DATE OF BIRTH:  [birth year]  DATE OF PROCEDURE: 07/18/2016                               OPERATIVE REPORT   DIAGNOSIS:  Recurrent bladder tumor.  PROCEDURES: 1. Cystoscopy with bilateral pyelogram and interpretation. 2. Transurethral resection of bladder tumor, volume medium.  ESTIMATED BLOOD LOSS:  Nil.  COMPLICATION:  None.  SPECIMENS: 1. Trigone bladder tumor. 2. Left wall bladder tumor for pathology.  FINDINGS: 1. Moderate volume multifocal bladder tumor recurrence including a     trigone tumor approximately 3 cm small volume left wall tumor in     prior resection site approximately 1 cm. 2. Complete resolution of all the tumor following transurethral     resection. 3. Unremarkable bilateral retrograde pyelograms. 4. Successful placement of final catheter.  INDICATION:  [name] is a very pleasant 70 year old gentleman, who has a long history of recurrent low-grade bladder tumor.  He has been very compliant with this surveillance and has not had recurrence in several years on his most recent surveillance cystoscopy.  Unfortunately, he was noted to have multifocal recurrence including numerous trigone and another focus in the midst of an old resection site of the left wall. There was no obvious ureteral orifice involvement.  Options were discussed including transient resection, and he wished to proceed. Given his history of low-grade pathology, it was not felt that restaging imaging was warranted preoperatively.  Informed consent was obtained and placed in the medical record.  PROCEDURE IN DETAIL:  The patient being [name], was verified. Procedure being transurethral resection of bladder tumor was confirmed. Procedure was carried out.  Time-out was performed.   Intravenous antibiotics were administered.  General LMA anesthesia was introduced. The patient was placed into a low lithotomy position and sterile field was created by prepping and draping the patient's penis, perineum and proximal thighs using iodine x3.  Next, cystourethroscopy was performed using a 21-French cystoscope with offset lens.  Inspection of anterior and posterior urethra were unremarkable.  Inspection of the urinary bladder revealed multifocal moderate volume bladder tumor including papillary tumor in the inter-trigone area and bladder neck posteriorly approximate volume of 3 cm and another focus approximately 1 cm in diameter in the midst of an old left wall resection site.  Both of these were not visible involving the ureteral orifice.  Attention was directed at retrograde pyelography.  First, the right ureteral orifice was cannulated with a 6-French end-hole catheter and right retrograde pyelogram was obtained.  Right retrograde pyelogram demonstrated a single right ureter with single-system right kidney.  No filling defects or narrowing noted. Similarly, left retrograde pyelogram seen.  Left retrograde pyelogram demonstrated a single left ureter with single- system left kidney.  No filling defects or narrowing noted.  Next, the cystoscope was exchanged for 26-French continuous flow resectoscope sheath with visual obturator and using medium-sized resectoscope loop, very careful systematic resection was performed of the entire trigone tumor taking exquisite care not to damage the ureteral  orifices or perforate urinary bladder, neither of which occurred.  These and bladder tumor fragments were set aside for permanent pathology, labeled as such. Cold cup biopsy forceps were used to obtain representative biopsy of the small focus of left wall recurrent tumor set aside labeled as such. Next, coagulation current was applied to the base of all resected areas, again taking  great care not to injure the ureteral orifices which did not occur.  Given the resection of the area of the trigone, it was felt that the patient would be at significant risk for a perioperative retention as such.  The decision was made to leave a preoperative catheter in place.  A new 24-French three-way hematuria type catheter with coude was advanced at level of the urinary bladder, 10 mL of sterile water placed in the balloon, this irrigated quantitatively to clear.  An irrigation port was plugged.  This was connected to straight drain and procedure terminated.  The patient tolerated procedure well. There were no immediate periprocedural complications.  The patient was taken to the postanesthesia care in stable condition.    ______________________________ Alexis Frock, MD   ______________________________ Alexis Frock, MD    TM/MEDQ  D:  07/18/2016  T:  07/19/2016  Job:  [id number]

## 2016-07-19 NOTE — Op Note (Deleted)
  The note originally documented on this encounter has been moved the the encounter in which it belongs.  

## 2016-07-27 ENCOUNTER — Ambulatory Visit: Payer: Medicare HMO | Admitting: Vascular Surgery

## 2016-07-27 ENCOUNTER — Encounter (HOSPITAL_COMMUNITY): Payer: Medicare HMO

## 2016-08-02 DIAGNOSIS — C61 Malignant neoplasm of prostate: Secondary | ICD-10-CM | POA: Diagnosis not present

## 2016-08-02 DIAGNOSIS — E559 Vitamin D deficiency, unspecified: Secondary | ICD-10-CM | POA: Diagnosis not present

## 2016-08-02 DIAGNOSIS — E782 Mixed hyperlipidemia: Secondary | ICD-10-CM | POA: Diagnosis not present

## 2016-08-02 DIAGNOSIS — E119 Type 2 diabetes mellitus without complications: Secondary | ICD-10-CM | POA: Diagnosis not present

## 2016-08-02 DIAGNOSIS — R3915 Urgency of urination: Secondary | ICD-10-CM | POA: Diagnosis not present

## 2016-08-02 DIAGNOSIS — C670 Malignant neoplasm of trigone of bladder: Secondary | ICD-10-CM | POA: Diagnosis not present

## 2016-08-06 DIAGNOSIS — L989 Disorder of the skin and subcutaneous tissue, unspecified: Secondary | ICD-10-CM | POA: Diagnosis not present

## 2016-08-06 DIAGNOSIS — I10 Essential (primary) hypertension: Secondary | ICD-10-CM | POA: Diagnosis not present

## 2016-08-06 DIAGNOSIS — M069 Rheumatoid arthritis, unspecified: Secondary | ICD-10-CM | POA: Diagnosis not present

## 2016-08-06 DIAGNOSIS — M109 Gout, unspecified: Secondary | ICD-10-CM | POA: Diagnosis not present

## 2016-08-06 DIAGNOSIS — Z6832 Body mass index (BMI) 32.0-32.9, adult: Secondary | ICD-10-CM | POA: Diagnosis not present

## 2016-08-06 DIAGNOSIS — E782 Mixed hyperlipidemia: Secondary | ICD-10-CM | POA: Diagnosis not present

## 2016-08-06 DIAGNOSIS — Z23 Encounter for immunization: Secondary | ICD-10-CM | POA: Diagnosis not present

## 2016-08-06 DIAGNOSIS — E1121 Type 2 diabetes mellitus with diabetic nephropathy: Secondary | ICD-10-CM | POA: Diagnosis not present

## 2016-08-06 DIAGNOSIS — Z0001 Encounter for general adult medical examination with abnormal findings: Secondary | ICD-10-CM | POA: Diagnosis not present

## 2016-08-06 DIAGNOSIS — R251 Tremor, unspecified: Secondary | ICD-10-CM | POA: Diagnosis not present

## 2016-08-22 DIAGNOSIS — D225 Melanocytic nevi of trunk: Secondary | ICD-10-CM | POA: Diagnosis not present

## 2016-08-22 DIAGNOSIS — L570 Actinic keratosis: Secondary | ICD-10-CM | POA: Diagnosis not present

## 2016-08-22 DIAGNOSIS — X32XXXD Exposure to sunlight, subsequent encounter: Secondary | ICD-10-CM | POA: Diagnosis not present

## 2016-08-22 DIAGNOSIS — L820 Inflamed seborrheic keratosis: Secondary | ICD-10-CM | POA: Diagnosis not present

## 2016-09-05 ENCOUNTER — Other Ambulatory Visit: Payer: Self-pay | Admitting: Cardiology

## 2016-09-29 ENCOUNTER — Other Ambulatory Visit: Payer: Self-pay | Admitting: Cardiology

## 2016-10-02 DIAGNOSIS — Z5111 Encounter for antineoplastic chemotherapy: Secondary | ICD-10-CM | POA: Diagnosis not present

## 2016-10-02 DIAGNOSIS — C670 Malignant neoplasm of trigone of bladder: Secondary | ICD-10-CM | POA: Diagnosis not present

## 2016-10-08 ENCOUNTER — Inpatient Hospital Stay (HOSPITAL_COMMUNITY)
Admission: EM | Admit: 2016-10-08 | Discharge: 2016-10-11 | DRG: 871 | Disposition: A | Discharge status: Home / Self Care | Payer: Medicare HMO | Attending: Internal Medicine | Admitting: Internal Medicine

## 2016-10-08 ENCOUNTER — Emergency Department (HOSPITAL_COMMUNITY): Payer: Medicare HMO

## 2016-10-08 ENCOUNTER — Encounter (HOSPITAL_COMMUNITY): Payer: Self-pay

## 2016-10-08 DIAGNOSIS — R0602 Shortness of breath: Secondary | ICD-10-CM

## 2016-10-08 DIAGNOSIS — N390 Urinary tract infection, site not specified: Secondary | ICD-10-CM | POA: Diagnosis present

## 2016-10-08 DIAGNOSIS — E119 Type 2 diabetes mellitus without complications: Secondary | ICD-10-CM

## 2016-10-08 DIAGNOSIS — J189 Pneumonia, unspecified organism: Secondary | ICD-10-CM | POA: Diagnosis not present

## 2016-10-08 DIAGNOSIS — Z9842 Cataract extraction status, left eye: Secondary | ICD-10-CM

## 2016-10-08 DIAGNOSIS — Z87891 Personal history of nicotine dependence: Secondary | ICD-10-CM

## 2016-10-08 DIAGNOSIS — Z885 Allergy status to narcotic agent status: Secondary | ICD-10-CM

## 2016-10-08 DIAGNOSIS — I2510 Atherosclerotic heart disease of native coronary artery without angina pectoris: Secondary | ICD-10-CM | POA: Diagnosis present

## 2016-10-08 DIAGNOSIS — Z79899 Other long term (current) drug therapy: Secondary | ICD-10-CM

## 2016-10-08 DIAGNOSIS — I7789 Other specified disorders of arteries and arterioles: Secondary | ICD-10-CM | POA: Diagnosis present

## 2016-10-08 DIAGNOSIS — A419 Sepsis, unspecified organism: Principal | ICD-10-CM | POA: Diagnosis present

## 2016-10-08 DIAGNOSIS — Z794 Long term (current) use of insulin: Secondary | ICD-10-CM

## 2016-10-08 DIAGNOSIS — Z981 Arthrodesis status: Secondary | ICD-10-CM

## 2016-10-08 DIAGNOSIS — Z888 Allergy status to other drugs, medicaments and biological substances status: Secondary | ICD-10-CM

## 2016-10-08 DIAGNOSIS — E1151 Type 2 diabetes mellitus with diabetic peripheral angiopathy without gangrene: Secondary | ICD-10-CM | POA: Diagnosis present

## 2016-10-08 DIAGNOSIS — Z8249 Family history of ischemic heart disease and other diseases of the circulatory system: Secondary | ICD-10-CM

## 2016-10-08 DIAGNOSIS — E785 Hyperlipidemia, unspecified: Secondary | ICD-10-CM | POA: Diagnosis present

## 2016-10-08 DIAGNOSIS — I779 Disorder of arteries and arterioles, unspecified: Secondary | ICD-10-CM | POA: Diagnosis present

## 2016-10-08 DIAGNOSIS — I739 Peripheral vascular disease, unspecified: Secondary | ICD-10-CM

## 2016-10-08 DIAGNOSIS — Z9221 Personal history of antineoplastic chemotherapy: Secondary | ICD-10-CM

## 2016-10-08 DIAGNOSIS — J181 Lobar pneumonia, unspecified organism: Secondary | ICD-10-CM

## 2016-10-08 DIAGNOSIS — G4733 Obstructive sleep apnea (adult) (pediatric): Secondary | ICD-10-CM | POA: Diagnosis present

## 2016-10-08 DIAGNOSIS — Z7982 Long term (current) use of aspirin: Secondary | ICD-10-CM

## 2016-10-08 DIAGNOSIS — Z961 Presence of intraocular lens: Secondary | ICD-10-CM | POA: Diagnosis present

## 2016-10-08 DIAGNOSIS — N400 Enlarged prostate without lower urinary tract symptoms: Secondary | ICD-10-CM | POA: Diagnosis present

## 2016-10-08 DIAGNOSIS — Z833 Family history of diabetes mellitus: Secondary | ICD-10-CM

## 2016-10-08 DIAGNOSIS — H9190 Unspecified hearing loss, unspecified ear: Secondary | ICD-10-CM | POA: Diagnosis present

## 2016-10-08 DIAGNOSIS — B9689 Other specified bacterial agents as the cause of diseases classified elsewhere: Secondary | ICD-10-CM | POA: Diagnosis present

## 2016-10-08 DIAGNOSIS — Z9841 Cataract extraction status, right eye: Secondary | ICD-10-CM

## 2016-10-08 DIAGNOSIS — C679 Malignant neoplasm of bladder, unspecified: Secondary | ICD-10-CM | POA: Diagnosis present

## 2016-10-08 DIAGNOSIS — D696 Thrombocytopenia, unspecified: Secondary | ICD-10-CM | POA: Diagnosis present

## 2016-10-08 DIAGNOSIS — I10 Essential (primary) hypertension: Secondary | ICD-10-CM | POA: Diagnosis present

## 2016-10-08 DIAGNOSIS — M109 Gout, unspecified: Secondary | ICD-10-CM | POA: Diagnosis present

## 2016-10-08 DIAGNOSIS — R918 Other nonspecific abnormal finding of lung field: Secondary | ICD-10-CM | POA: Diagnosis not present

## 2016-10-08 LAB — COMPREHENSIVE METABOLIC PANEL
ALBUMIN: 4.5 g/dL (ref 3.5–5.0)
ALT: 21 U/L (ref 17–63)
AST: 19 U/L (ref 15–41)
Alkaline Phosphatase: 56 U/L (ref 38–126)
Anion gap: 9 (ref 5–15)
BUN: 16 mg/dL (ref 6–20)
CHLORIDE: 104 mmol/L (ref 101–111)
CO2: 25 mmol/L (ref 22–32)
Calcium: 9.2 mg/dL (ref 8.9–10.3)
Creatinine, Ser: 1.06 mg/dL (ref 0.61–1.24)
GFR calc Af Amer: >60 mL/min (ref >60)
Glucose, Bld: 136 mg/dL — ABNORMAL HIGH (ref 65–99)
POTASSIUM: 3.6 mmol/L (ref 3.5–5.1)
Sodium: 138 mmol/L (ref 135–145)
Total Bilirubin: 0.9 mg/dL (ref 0.3–1.2)
Total Protein: 7.8 g/dL (ref 6.5–8.1)

## 2016-10-08 LAB — CBC WITH DIFFERENTIAL/PLATELET
Basophils Absolute: 0 10*3/uL (ref 0.0–0.1)
Basophils Relative: 0 %
EOS PCT: 3 %
Eosinophils Absolute: 0.3 10*3/uL (ref 0.0–0.7)
HEMATOCRIT: 39.5 % (ref 39.0–52.0)
HEMOGLOBIN: 13.5 g/dL (ref 13.0–17.0)
LYMPHS PCT: 10 %
Lymphs Abs: 1.1 10*3/uL (ref 0.7–4.0)
MCH: 31.3 pg (ref 26.0–34.0)
MCHC: 34.2 g/dL (ref 30.0–36.0)
MCV: 91.4 fL (ref 78.0–100.0)
Monocytes Absolute: 1.4 10*3/uL — ABNORMAL HIGH (ref 0.1–1.0)
Monocytes Relative: 13 %
NEUTROS PCT: 74 %
Neutro Abs: 8.4 10*3/uL — ABNORMAL HIGH (ref 1.7–7.7)
Platelets: 143 10*3/uL — ABNORMAL LOW (ref 150–400)
RBC: 4.32 MIL/uL (ref 4.22–5.81)
RDW: 13.3 % (ref 11.5–15.5)
WBC: 11.3 10*3/uL — AB (ref 4.0–10.5)

## 2016-10-08 LAB — BRAIN NATRIURETIC PEPTIDE: B NATRIURETIC PEPTIDE 5: 25 pg/mL (ref 0.0–100.0)

## 2016-10-08 LAB — I-STAT CG4 LACTIC ACID, ED: LACTIC ACID, VENOUS: 1.07 mmol/L (ref 0.5–1.9)

## 2016-10-08 LAB — I-STAT TROPONIN, ED: TROPONIN I, POC: 0.01 ng/mL (ref 0.00–0.08)

## 2016-10-08 MED ORDER — VANCOMYCIN HCL IN DEXTROSE 1-5 GM/200ML-% IV SOLN
1000.0000 mg | Freq: Once | INTRAVENOUS | Status: AC
  Administered 2016-10-09: 1000 mg via INTRAVENOUS
  Filled 2016-10-08: qty 200

## 2016-10-08 MED ORDER — IPRATROPIUM-ALBUTEROL 0.5-2.5 (3) MG/3ML IN SOLN
3.0000 mL | Freq: Once | RESPIRATORY_TRACT | Status: AC
  Administered 2016-10-08: 3 mL via RESPIRATORY_TRACT
  Filled 2016-10-08: qty 3

## 2016-10-08 MED ORDER — IBUPROFEN 800 MG PO TABS
800.0000 mg | ORAL_TABLET | Freq: Once | ORAL | Status: AC
  Administered 2016-10-08: 800 mg via ORAL

## 2016-10-08 MED ORDER — ACETAMINOPHEN 500 MG PO TABS
ORAL_TABLET | ORAL | Status: AC
  Administered 2016-10-08: 1000 mg via ORAL
  Filled 2016-10-08: qty 2

## 2016-10-08 MED ORDER — IBUPROFEN 800 MG PO TABS
ORAL_TABLET | ORAL | Status: AC
  Filled 2016-10-08: qty 1

## 2016-10-08 MED ORDER — PIPERACILLIN-TAZOBACTAM 3.375 G IVPB 30 MIN
3.3750 g | Freq: Once | INTRAVENOUS | Status: AC
  Administered 2016-10-09: 3.375 g via INTRAVENOUS
  Filled 2016-10-08: qty 50

## 2016-10-08 MED ORDER — ACETAMINOPHEN 500 MG PO TABS
1000.0000 mg | ORAL_TABLET | Freq: Once | ORAL | Status: AC
  Administered 2016-10-08: 1000 mg via ORAL

## 2016-10-08 NOTE — ED Triage Notes (Signed)
He is having pain all over, fever, and he is shaking.  He has bladder cancer and they are using Bacillus Calmette-Guerin therapy to treat his bladder cancer.  The last time he was shaking like this he was septic per daughter-in-law.

## 2016-10-08 NOTE — Progress Notes (Signed)
ANTIBIOTIC CONSULT NOTE-Preliminary  Pharmacy Consult for Vancomycin and Zosyn Indication: Sepsis  Allergies  Allergen Reactions  . Dilaudid [Hydromorphone Hcl] Other (See Comments)    "acts a little off"  . Toradol [Ketorolac Tromethamine] Other (See Comments)    CauseD  hallucination ?-states not sure/ avoids per his MD  . Lyrica [Pregabalin] Nausea Only and Other (See Comments)    Make me feel "bad"    Patient Measurements: Height: 5\' 9"  (175.3 cm) Weight: 230 lb (104.3 kg) IBW/kg (Calculated) : 70.7  Vital Signs: Temp: 102.3 F (39.1 C) (01/22 2253) Temp Source: Rectal (01/22 2253) BP: 157/82 (01/22 2308) Pulse Rate: 94 (01/22 2308)  Labs:  Recent Labs  10/08/16 2251  WBC 11.3*  HGB 13.5  PLT 143*    CrCl cannot be calculated (Patient's most recent lab result is older than the maximum 21 days allowed.).  No results for input(s): VANCOTROUGH, VANCOPEAK, VANCORANDOM, GENTTROUGH, GENTPEAK, GENTRANDOM, TOBRATROUGH, TOBRAPEAK, TOBRARND, AMIKACINPEAK, AMIKACINTROU, AMIKACIN in the last 72 hours.   Microbiology: No results found for this or any previous visit (from the past 720 hour(s)).  Medical History: Past Medical History:  Diagnosis Date  . Bilateral renal cysts   . Bladder cancer ([place]) UROLOGIST-  [facility]   RECURRENT 11/ 2017 --  hx turbt in 2004 and 08-19-2013  . BPH without obstruction/lower urinary tract symptoms   . Cervical fusion syndrome    LEFT ARM NUMBNESS--  CONTROLLED W/ GABAPENTIN  . Coronary artery disease    CARDIOLOGIST-- DR BRANCH [name])  . DDD (degenerative disc disease), cervical   . DDD (degenerative disc disease), lumbar   . Diverticulosis of sigmoid colon   . History of acute gouty arthritis   . History of adenomatous polyp of colon    2001;  2012- non-malignant leiomyoma;  04-07-2014  tubualr adenoma and hyperplastic polyp's  . History of prostate cancer UROLOGIST-  DR [facility]   S/P  RADIOACTIVE SEED IMPLANTS 2004  .  HOH (hard of hearing)    no hearing aids  . Hyperlipidemia   . Hypertension   . OA (osteoarthritis)   . Occlusion and stenosis of carotid artery without mention of cerebral infarction CARDIOLOGIST  -- DR  [name]   LAST DUPLEX  11-23-2015  RICA  (DUPLEX DOPPLER 01-20-2016 RICA [id number] PROXIMAL) /   LICA [id number] (HAD CONSULT W/ [name] , VASCULAR SURGEON 01-20-2016)  . OSA on CPAP    BiPAP  . Spondylosis, cervical   . Type 2 diabetes mellitus ([place])   . Wears glasses     Medications: Zosyn 3.375Gm IV x 1 dose ordered in the ED Vancomycin 1 Gm IV x 1 dose ordered in the ED   Assessment: 71 yo male seen in the ED with shaking chills, fever, and cough. Pt is currently receiving treatment for bladder cancer; last given Bacillus Calmette-Guerin within this past week. Pt to be given empiric antibiotics for presumed sepsis. Estimated CrCl corrected for obesity is @87  ml/min.  Goal of Therapy:  Vancomycin trough 15-20 mcg/ml Eradicate infection  Plan:  Preliminary review of pertinent patient information completed.  Protocol will be initiated with a one-time dose of Vancomycin 1 Gm IV in addition to the 1 Gm dose orderd in the ED, for a total loading dose of 2 Gm IV.  Forestine Na clinical pharmacist will complete review during morning rounds to assess patient and finalize treatment regimen.  Norberto Sorenson, [facility] 10/08/2016,11:39 PM

## 2016-10-08 NOTE — ED Provider Notes (Signed)
[facility] DEPT Provider Note   CSN: [id number] Arrival date & time: 10/08/16  2216   By signing my name below, I, Eunice Blase, attest that this documentation has been prepared under the direction and in the presence of Ezequiel Essex, MD. Electronically signed, Eunice Blase, ED Scribe. 10/08/16. 2:02 AM.   History   Chief Complaint Chief Complaint  Patient presents with  . Fever   The history is provided by the patient and medical records. No language interpreter was used.    HPI Comments: [name] is a 70 y.o. male who presents to the Emergency Department complaining of fever onset 10/08/2016. He reports generalized pain, dry cough, headache and diarrhea. Family notes bilateral shakiness that is increased with sickness and decreased ROM of the neck since having metal placed in his neck. Hx of prostate bladder cancer and sepsis treated last with Bacillus Calmette-Guerin ~1 week ago. Hx of brain surgeries, meningitis and DM noted. Pt notes he takes plavix,and lasix at home. He further notes he took Ibuprofen this morning. Pt denies SOB, chest pain, N/V, dysuria, hematuria or Hx of heart attack or stent. PCP Delphina Cahill.  Past Medical History:  Diagnosis Date  . Bilateral renal cysts   . Bladder cancer ([place]) UROLOGIST-  DR [facility]   RECURRENT 11/ 2017 --  hx turbt in 2004 and 08-19-2013  . BPH without obstruction/lower urinary tract symptoms   . Cervical fusion syndrome    LEFT ARM NUMBNESS--  CONTROLLED W/ GABAPENTIN  . Coronary artery disease    CARDIOLOGIST-- DR BRANCH [name])  . DDD (degenerative disc disease), cervical   . DDD (degenerative disc disease), lumbar   . Diverticulosis of sigmoid colon   . History of acute gouty arthritis   . History of adenomatous polyp of colon    2001;  2012- non-malignant leiomyoma;  04-07-2014  tubualr adenoma and hyperplastic polyp's  . History of prostate cancer UROLOGIST-  DR [facility]   S/P  RADIOACTIVE SEED IMPLANTS  2004  . HOH (hard of hearing)    no hearing aids  . Hyperlipidemia   . Hypertension   . OA (osteoarthritis)   . Occlusion and stenosis of carotid artery without mention of cerebral infarction CARDIOLOGIST  -- DR  [name]   LAST DUPLEX  11-23-2015  RICA  (DUPLEX DOPPLER 01-20-2016 RICA [id number] PROXIMAL) /   LICA [id number] (HAD CONSULT W/ [name] , VASCULAR SURGEON 01-20-2016)  . OSA on CPAP    BiPAP  . Spondylosis, cervical   . Type 2 diabetes mellitus ([place])   . Wears glasses     Patient Active Problem List   Diagnosis Date Noted  . Fever 10/09/2016  . PNA (pneumonia) 10/09/2016  . Gram negative sepsis ([place]) 02/05/2014  . UTI (urinary tract infection) 02/04/2014  . Sepsis ([place]) 02/04/2014  . DM type 2 (diabetes mellitus, type 2) ([place]) 02/04/2014  . Tobacco abuse 02/04/2014  . Preop cardiovascular exam 08/05/2013  . CAD (coronary artery disease) 08/05/2012  . Degenerative lumbar spinal stenosis 04/03/2012  . Chest pain 04/03/2012  . Anemia 04/03/2012  . Smoking 06/13/2011  . JOINT EFFUSION, KNEE 01/11/2010  . ARTHRITIS, RIGHT KNEE 12/12/2009  . PAIN IN JOINT, LOWER LEG 12/12/2009  . PROSTATE CANCER 11/08/2009  . GOUTY ARTHRITIS, CHRONIC 11/08/2009  . OVERWEIGHT/OBESITY 11/08/2009  . HYPERTENSION, BENIGN 11/08/2009  . Carotid artery disease without cerebral infarction ([place]) 11/08/2009  . Malignant neoplasm of bladder ([place]) 03/22/2008  . Hyperlipidemia 03/22/2008  . PAD (peripheral artery  disease) ([place]) 03/22/2008  . CONSTIPATION, CHRONIC 03/22/2008  . DEGENERATIVE JOINT DISEASE 03/22/2008  . OSA (obstructive sleep apnea) 03/22/2008  . PROSTATE CANCER, HX OF 03/22/2008  . DIVERTICULITIS, HX OF 03/22/2008    Past Surgical History:  Procedure Laterality Date  . ANTERIOR REMOVAL CAGE AND PLATE C3-C7/ CORPECTOMY C7 (FX)/  ALLOGRAFT AND FUSION C3 -- T1/  POSTERIOR DECOMPRESSION BILATERAL LAMINECTOMY C4 -- 6 & PARTIAL C3/  POSTEROLATERAL ARTHRODESIS C3 - T1  06/05/2005  .  APPENDECTOMY  1990'S  . CATARACT EXTRACTION W/ INTRAOCULAR LENS  IMPLANT, BILATERAL  2013  . CERVICAL FUSION  05/11/2005   C3  --  C7/  due to post op quadriparesis same day s/p  anterior C 4,5,6, colpectomy, decompression, removal epidural hematoma, foraminnotomy, cage and plate  . COLONOSCOPY  last one 04-07-2014  . CYSTOSCOPY W/ RETROGRADES Bilateral 08/19/2013   Procedure: CYSTOSCOPY WITH RETROGRADE PYELOGRAM;  Surgeon: Molli Hazard, MD;  Location[facility];  Service: Urology;  Laterality: Bilateral;  . CYSTOSCOPY W/ RETROGRADES Bilateral 07/18/2016   Procedure: CYSTOSCOPY WITH RETROGRADE PYELOGRAM;  Surgeon: Alexis Frock, MD;  Location[facility];  Service: Urology;  Laterality: Bilateral;  . ENDOSCOPIC REPAIR CSF LEAK VIA NASAL PASSAGE  2011  . INTRAOPERATIVE ARTERIOGRAM  CATH LAB  01-05-2009  DR Einar Gip   RICA   ACUTE ANGLE 80-85% STENOSIS  . LUMBAR FUSION  03-26-2012   L3 --  L5  . RADIOACTIVE PROSTATE SEED IMPLANTS  08-19-2003  . SEPTOPLASTY  1998  . TRANSURETHRAL RESECTION OF BLADDER TUMOR N/A 08/19/2013   Procedure: TRANSURETHRAL RESECTION OF BLADDER TUMOR (TURBT);  Surgeon: Molli Hazard, MD;  Location: [facility];  Service: Urology;  Laterality: N/A;  . TRANSURETHRAL RESECTION OF BLADDER TUMOR N/A 07/18/2016   Procedure: TRANSURETHRAL RESECTION OF BLADDER TUMOR (TURBT);  Surgeon: Alexis Frock, MD;  Location: [facility];  Service: Urology;  Laterality: N/A;  . YAG LASER APPLICATION  [id number]   Procedure: YAG LASER APPLICATION;  Surgeon: Elta Guadeloupe T. Gershon Crane, MD;  Location: [facility];  Service: Ophthalmology;  Laterality: Right;       Home Medications    Prior to Admission medications   Medication Sig Start Date End Date Taking? Authorizing Provider  allopurinol (ZYLOPRIM) 300 MG tablet Take 300 mg by mouth at bedtime.     Historical Provider, MD  amLODipine (NORVASC) 10 MG tablet TAKE 1 TABLET BY  MOUTH EVERY DAY Patient taking differently: TAKE 1 TABLET BY MOUTH EVERY DAY-- takes in am 04/04/16   Minna Merritts, MD  aspirin 81 MG EC tablet Take 81 mg by mouth at bedtime.     Historical Provider, MD  celecoxib (CELEBREX) 200 MG capsule Take 200 mg by mouth every morning.    Historical Provider, MD  colchicine 0.6 MG tablet Take 0.6 mg by mouth daily as needed. For gout    Historical Provider, MD  docusate sodium (COLACE) 100 MG capsule Take 200 mg by mouth every evening.     Historical Provider, MD  dutasteride (AVODART) 0.5 MG capsule Take 0.5 mg by mouth every morning.     Historical Provider, MD  finasteride (PROSCAR) 5 MG tablet Take 5 mg by mouth at bedtime.    Historical Provider, MD  furosemide (LASIX) 40 MG tablet Take 40 mg by mouth every morning.     Historical Provider, MD  gabapentin (NEURONTIN) 300 MG capsule Take 300 mg by mouth 3 (three) times daily.  Historical Provider, MD  ibuprofen (ADVIL,MOTRIN) 200 MG tablet Take 800 mg by mouth every 8 (eight) hours as needed for moderate pain. Reported on 01/20/2016    Historical Provider, MD  insulin detemir (LEVEMIR) 100 UNIT/ML injection Inject 25 Units into the skin at bedtime.     Historical Provider, MD  labetalol (NORMODYNE) 100 MG tablet Take 300 mg by mouth 2 (two) times daily. Take 3 (100 mg) tablets twice a day.    Historical Provider, MD  losartan (COZAAR) 100 MG tablet TAKE 1 TABLET BY MOUTH DAILY 09/05/16   Arnoldo Lenis, MD  losartan (COZAAR) 100 MG tablet TAKE 1 TABLET BY MOUTH DAILY 10/01/16   Arnoldo Lenis, MD  Naproxen Sodium (ALEVE) 220 MG CAPS Take 1 capsule by mouth daily as needed.    Historical Provider, MD  omega-3 acid ethyl esters (LOVAZA) 1 G capsule Take 1 g by mouth 2 (two) times daily.     Historical Provider, MD  oxyCODONE-acetaminophen (ROXICET) 5-325 MG tablet Take 1 tablet by mouth every 6 (six) hours as needed for severe pain. Post-operatively 07/18/16   Alexis Frock, MD  polyethylene  glycol powder (GLYCOLAX/MIRALAX) powder TAKE 17MG (1CAPFUL) BY MOUTH(MIXED WITH WATER/JUICE) DAILY Patient taking differently: TAKE 17MG (1CAPFUL) BY MOUTH(MIXED WITH WATER/JUICE) DAILY--  takes in pm 05/02/15   Lafayette Dragon, MD  rosuvastatin (CRESTOR) 10 MG tablet Take 10 mg by mouth every morning.     Historical Provider, MD  senna-docusate (SENOKOT-S) 8.6-50 MG tablet Take 1 tablet by mouth 2 (two) times daily. While taking pain meds to prevent constipation. 07/18/16   Alexis Frock, MD  Tamsulosin HCl (FLOMAX) 0.4 MG CAPS Take 0.4 mg by mouth at bedtime. Reported on 01/20/2016    Historical Provider, MD  tiZANidine (ZANAFLEX) 4 MG tablet Take 4 mg by mouth every 6 (six) hours as needed for muscle spasms. Reported on 01/20/2016    Historical Provider, MD    Family History Family History  Problem Relation Age of Onset  . Breast cancer Mother     mets  . Diabetes Mother   . Heart disease Father     MI  . Colon cancer Brother   . Cancer Sister     male organs  . Diabetes Sister     family hx  . Arthritis Other     entire family  . Diabetes Brother     x 2    Social History Social History  Substance Use Topics  . Smoking status: Former Smoker    Packs/day: 2.00    Years: 47.00    Types: Cigarettes    Quit date: 05/18/2016  . Smokeless tobacco: Never Used     Comment: pt quits smoking periodically  . Alcohol use No     Allergies   Dilaudid [hydromorphone hcl]; Toradol [ketorolac tromethamine]; and Lyrica [pregabalin]   Review of Systems Review of Systems A complete 10 system review of systems was obtained and all systems are negative except as noted in the HPI and PMH.    Physical Exam Updated Vital Signs BP 145/79   Pulse 82   Temp 99.3 F (37.4 C) (Oral)   Resp 21   Ht 5\' 9"  (1.753 m)   Wt 230 lb (104.3 kg)   SpO2 97%   BMI 33.97 kg/m   Physical Exam  Constitutional: He is oriented to person, place, and time. He appears ill.  Shaking chills  HENT:  Head:  Normocephalic and atraumatic.  Mouth/Throat: Oropharynx is clear and  moist. No oropharyngeal exudate.  Eyes: Conjunctivae and EOM are normal. Pupils are equal, round, and reactive to light.  Neck: Normal range of motion. Neck supple.  No meningismus.  Cardiovascular: Regular rhythm, normal heart sounds and intact distal pulses.  Tachycardia present.   No murmur heard. Tachycardic to the 100's  Pulmonary/Chest: Effort normal. No respiratory distress. He has wheezes (scattered bilaterally). He has rhonchi (bilaterally).  Abdominal: Soft. There is no tenderness. There is no rebound and no guarding.  Musculoskeletal: Normal range of motion. He exhibits no edema or tenderness.  Neurological: He is alert and oriented to person, place, and time. No cranial nerve deficit. He exhibits normal muscle tone. Coordination normal.   5/5 strength throughout. CN 2-12 intact.Equal grip strength. Moving all extremities.  Skin: Skin is warm.  Psychiatric: He has a normal mood and affect. His behavior is normal.  Nursing note and vitals reviewed.    Ill appear shaking chills rhonchi and scat wheeze bilat moving all extremities tachy to 100's  ED Treatments / Results  DIAGNOSTIC STUDIES: Oxygen Saturation is 97% on RA, normal by my interpretation.    COORDINATION OF CARE: 2:02 AM Discussed treatment plan with pt at bedside and pt agreed to plan.  Labs (all labs ordered are listed, but only abnormal results are displayed) Labs Reviewed  COMPREHENSIVE METABOLIC PANEL - Abnormal; Notable for the following:       Result Value   Glucose, Bld 136 (*)    All other components within normal limits  CBC WITH DIFFERENTIAL/PLATELET - Abnormal; Notable for the following:    WBC 11.3 (*)    Platelets 143 (*)    Neutro Abs 8.4 (*)    Monocytes Absolute 1.4 (*)    All other components within normal limits  BASIC METABOLIC PANEL - Abnormal; Notable for the following:    Glucose, Bld 141 (*)    Calcium 8.7 (*)     All other components within normal limits  CBC WITH DIFFERENTIAL/PLATELET - Abnormal; Notable for the following:    RBC 4.21 (*)    HCT 38.6 (*)    Platelets 129 (*)    All other components within normal limits  GLUCOSE, CAPILLARY - Abnormal; Notable for the following:    Glucose-Capillary 179 (*)    All other components within normal limits  CULTURE, BLOOD (ROUTINE X 2)  CULTURE, BLOOD (ROUTINE X 2)  URINE CULTURE  MRSA PCR SCREENING  CULTURE, EXPECTORATED SPUTUM-ASSESSMENT  GRAM STAIN  BRAIN NATRIURETIC PEPTIDE  INFLUENZA PANEL BY PCR (TYPE A & B)  PROCALCITONIN  URINALYSIS, ROUTINE W REFLEX MICROSCOPIC  STREP PNEUMONIAE URINARY ANTIGEN  I-STAT CG4 LACTIC ACID, ED  I-STAT CG4 LACTIC ACID, ED  I-STAT TROPOININ, ED  I-STAT CG4 LACTIC ACID, ED    EKG  EKG Interpretation  Date/Time:  Monday October 08 2016 22:49:23 EST Ventricular Rate:  98 PR Interval:    QRS Duration: 85 QT Interval:  356 QTC Calculation: 455 R Axis:   2 Text Interpretation:  Sinus rhythm Borderline T wave abnormalities Baseline wander in lead(s) V3 V6 No significant change was found Confirmed by Wyvonnia Dusky  MD, Trea Latner [phone]) on 10/08/2016 10:59:17 PM       Radiology Dg Chest 2 View  Result Date: 10/09/2016 CLINICAL DATA:  Acute onset of generalized body pain and fever. Uncontrollable shaking. Initial encounter. EXAM: CHEST  2 VIEW COMPARISON:  Chest radiograph performed 02/04/2014 FINDINGS: The lungs are well-aerated. Minimal left basilar opacity raises concern for infection, depending on the  patient's symptoms. There is no evidence of pleural effusion or pneumothorax. The heart is normal in size; the mediastinal contour is within normal limits. No acute osseous abnormalities are seen. Cervical spinal fusion hardware is partially imaged. IMPRESSION: Minimal left basilar airspace opacity raises concern for infection, depending on the patient's symptoms. Electronically Signed   By: Garald Balding M.D.   On:  10/09/2016 00:08    Procedures Procedures (including critical care time)  Medications Ordered in ED Medications  vancomycin (VANCOCIN) IVPB 1000 mg/200 mL premix (1,000 mg Intravenous New Bag/Given 10/09/16 0112)  sodium chloride 0.9 % bolus 1,000 mL (1,000 mLs Intravenous New Bag/Given 10/09/16 0027)  0.9 %  sodium chloride infusion (not administered)  acetaminophen (TYLENOL) tablet 1,000 mg (1,000 mg Oral Given 10/08/16 2320)  ibuprofen (ADVIL,MOTRIN) tablet 800 mg (800 mg Oral Given 10/08/16 2320)  piperacillin-tazobactam (ZOSYN) IVPB 3.375 g (0 g Intravenous Stopped 10/09/16 0036)  ipratropium-albuterol (DUONEB) 0.5-2.5 (3) MG/3ML nebulizer solution 3 mL (3 mLs Nebulization Given 10/08/16 2353)  vancomycin (VANCOCIN) IVPB 1000 mg/200 mL premix (0 mg Intravenous Stopped 10/09/16 0113)     Initial Impression / Assessment and Plan / ED Course  I have reviewed the triage vital signs and the nursing notes.  Pertinent labs & imaging results that were available during my care of the patient were reviewed by me and considered in my medical decision making (see chart for details).     Patient with one day history of diffuse body aches, fever, shaking chills and rigor. Has history of bladder cancer currently receiving chemotherapy. Last treatment was 6 days ago.   Patient febrile and tachycardic. Meet sepsis criteria. Patient given broad-spectrum antibiotics, IV fluids, labs and cultures obtained.  Probable infiltrate on CXR.  Treat broadly for HCAP. Flu swab sent.   BP stable in the ED.  Fever improving.  Not hypoxic.   Admission d/w Dr. Shanon Brow for HCAP and sepsis.   CRITICAL CARE Performed by: Ezequiel Essex Total critical care time: 35 minutes Critical care time was exclusive of separately billable procedures and treating other patients. Critical care was necessary to treat or prevent imminent or life-threatening deterioration. Critical care was time spent personally by me on the  following activities: development of treatment plan with patient and/or surrogate as well as nursing, discussions with consultants, evaluation of patient's response to treatment, examination of patient, obtaining history from patient or surrogate, ordering and performing treatments and interventions, ordering and review of laboratory studies, ordering and review of radiographic studies, pulse oximetry and re-evaluation of patient's condition.  \ Final Clinical Impressions(s) / ED Diagnoses   Final diagnoses:  Sepsis, due to unspecified organism [facility])  Community acquired pneumonia of left lower lobe of lung ([place])    New Prescriptions New Prescriptions   No medications on file    I personally performed the services described in this documentation, which was scribed in my presence. The recorded information has been reviewed and is accurate.    Ezequiel Essex, MD 10/09/16 [phone]

## 2016-10-09 ENCOUNTER — Encounter (HOSPITAL_COMMUNITY): Payer: Self-pay | Admitting: *Deleted

## 2016-10-09 DIAGNOSIS — A419 Sepsis, unspecified organism: Secondary | ICD-10-CM | POA: Diagnosis not present

## 2016-10-09 DIAGNOSIS — J181 Lobar pneumonia, unspecified organism: Secondary | ICD-10-CM

## 2016-10-09 DIAGNOSIS — G4733 Obstructive sleep apnea (adult) (pediatric): Secondary | ICD-10-CM | POA: Diagnosis present

## 2016-10-09 DIAGNOSIS — Z8249 Family history of ischemic heart disease and other diseases of the circulatory system: Secondary | ICD-10-CM | POA: Diagnosis not present

## 2016-10-09 DIAGNOSIS — Z833 Family history of diabetes mellitus: Secondary | ICD-10-CM | POA: Diagnosis not present

## 2016-10-09 DIAGNOSIS — I2510 Atherosclerotic heart disease of native coronary artery without angina pectoris: Secondary | ICD-10-CM | POA: Diagnosis not present

## 2016-10-09 DIAGNOSIS — E785 Hyperlipidemia, unspecified: Secondary | ICD-10-CM | POA: Diagnosis present

## 2016-10-09 DIAGNOSIS — H9190 Unspecified hearing loss, unspecified ear: Secondary | ICD-10-CM | POA: Diagnosis present

## 2016-10-09 DIAGNOSIS — Z981 Arthrodesis status: Secondary | ICD-10-CM | POA: Diagnosis not present

## 2016-10-09 DIAGNOSIS — C679 Malignant neoplasm of bladder, unspecified: Secondary | ICD-10-CM

## 2016-10-09 DIAGNOSIS — Z79899 Other long term (current) drug therapy: Secondary | ICD-10-CM | POA: Diagnosis not present

## 2016-10-09 DIAGNOSIS — J189 Pneumonia, unspecified organism: Secondary | ICD-10-CM | POA: Diagnosis present

## 2016-10-09 DIAGNOSIS — R509 Fever, unspecified: Secondary | ICD-10-CM | POA: Insufficient documentation

## 2016-10-09 DIAGNOSIS — D696 Thrombocytopenia, unspecified: Secondary | ICD-10-CM | POA: Diagnosis not present

## 2016-10-09 DIAGNOSIS — Z885 Allergy status to narcotic agent status: Secondary | ICD-10-CM | POA: Diagnosis not present

## 2016-10-09 DIAGNOSIS — R0602 Shortness of breath: Secondary | ICD-10-CM | POA: Diagnosis not present

## 2016-10-09 DIAGNOSIS — M109 Gout, unspecified: Secondary | ICD-10-CM | POA: Diagnosis present

## 2016-10-09 DIAGNOSIS — N390 Urinary tract infection, site not specified: Secondary | ICD-10-CM | POA: Diagnosis not present

## 2016-10-09 DIAGNOSIS — N400 Enlarged prostate without lower urinary tract symptoms: Secondary | ICD-10-CM | POA: Diagnosis present

## 2016-10-09 DIAGNOSIS — Z87891 Personal history of nicotine dependence: Secondary | ICD-10-CM | POA: Diagnosis not present

## 2016-10-09 DIAGNOSIS — Z888 Allergy status to other drugs, medicaments and biological substances status: Secondary | ICD-10-CM | POA: Diagnosis not present

## 2016-10-09 DIAGNOSIS — Z794 Long term (current) use of insulin: Secondary | ICD-10-CM | POA: Diagnosis not present

## 2016-10-09 DIAGNOSIS — Z9221 Personal history of antineoplastic chemotherapy: Secondary | ICD-10-CM | POA: Diagnosis not present

## 2016-10-09 DIAGNOSIS — I10 Essential (primary) hypertension: Secondary | ICD-10-CM | POA: Diagnosis present

## 2016-10-09 DIAGNOSIS — Z7982 Long term (current) use of aspirin: Secondary | ICD-10-CM | POA: Diagnosis not present

## 2016-10-09 DIAGNOSIS — B9689 Other specified bacterial agents as the cause of diseases classified elsewhere: Secondary | ICD-10-CM | POA: Diagnosis present

## 2016-10-09 DIAGNOSIS — E1151 Type 2 diabetes mellitus with diabetic peripheral angiopathy without gangrene: Secondary | ICD-10-CM | POA: Diagnosis not present

## 2016-10-09 LAB — BASIC METABOLIC PANEL
ANION GAP: 8 (ref 5–15)
BUN: 15 mg/dL (ref 6–20)
CHLORIDE: 104 mmol/L (ref 101–111)
CO2: 26 mmol/L (ref 22–32)
CREATININE: 1.01 mg/dL (ref 0.61–1.24)
Calcium: 8.7 mg/dL — ABNORMAL LOW (ref 8.9–10.3)
GFR calc non Af Amer: >60 mL/min (ref >60)
Glucose, Bld: 141 mg/dL — ABNORMAL HIGH (ref 65–99)
Potassium: 3.5 mmol/L (ref 3.5–5.1)
SODIUM: 138 mmol/L (ref 135–145)

## 2016-10-09 LAB — I-STAT CG4 LACTIC ACID, ED: LACTIC ACID, VENOUS: 1.27 mmol/L (ref 0.5–1.9)

## 2016-10-09 LAB — CBC WITH DIFFERENTIAL/PLATELET
BASOS PCT: 0 %
Basophils Absolute: 0 10*3/uL (ref 0.0–0.1)
EOS ABS: 0.2 10*3/uL (ref 0.0–0.7)
Eosinophils Relative: 2 %
HEMATOCRIT: 38.6 % — AB (ref 39.0–52.0)
HEMOGLOBIN: 13.3 g/dL (ref 13.0–17.0)
LYMPHS ABS: 1.1 10*3/uL (ref 0.7–4.0)
Lymphocytes Relative: 12 %
MCH: 31.6 pg (ref 26.0–34.0)
MCHC: 34.5 g/dL (ref 30.0–36.0)
MCV: 91.7 fL (ref 78.0–100.0)
MONOS PCT: 6 %
Monocytes Absolute: 0.6 10*3/uL (ref 0.1–1.0)
NEUTROS ABS: 7 10*3/uL (ref 1.7–7.7)
NEUTROS PCT: 80 %
Platelets: 129 10*3/uL — ABNORMAL LOW (ref 150–400)
RBC: 4.21 MIL/uL — ABNORMAL LOW (ref 4.22–5.81)
RDW: 13.5 % (ref 11.5–15.5)
WBC: 8.8 10*3/uL (ref 4.0–10.5)

## 2016-10-09 LAB — BLOOD CULTURE ID PANEL (REFLEXED)
Acinetobacter baumannii: NOT DETECTED
CANDIDA PARAPSILOSIS: NOT DETECTED
CANDIDA TROPICALIS: NOT DETECTED
CARBAPENEM RESISTANCE: NOT DETECTED
Candida albicans: NOT DETECTED
Candida glabrata: NOT DETECTED
Candida krusei: NOT DETECTED
Enterobacter cloacae complex: NOT DETECTED
Enterobacteriaceae species: DETECTED — AB
Enterococcus species: NOT DETECTED
Escherichia coli: NOT DETECTED
HAEMOPHILUS INFLUENZAE: NOT DETECTED
KLEBSIELLA PNEUMONIAE: NOT DETECTED
Klebsiella oxytoca: NOT DETECTED
Listeria monocytogenes: NOT DETECTED
NEISSERIA MENINGITIDIS: NOT DETECTED
PROTEUS SPECIES: DETECTED — AB
Pseudomonas aeruginosa: NOT DETECTED
STAPHYLOCOCCUS AUREUS BCID: NOT DETECTED
STAPHYLOCOCCUS SPECIES: NOT DETECTED
STREPTOCOCCUS SPECIES: NOT DETECTED
Serratia marcescens: NOT DETECTED
Streptococcus agalactiae: NOT DETECTED
Streptococcus pneumoniae: NOT DETECTED
Streptococcus pyogenes: NOT DETECTED

## 2016-10-09 LAB — URINALYSIS, ROUTINE W REFLEX MICROSCOPIC
BILIRUBIN URINE: NEGATIVE
GLUCOSE, UA: NEGATIVE mg/dL
KETONES UR: NEGATIVE mg/dL
Nitrite: POSITIVE — AB
PROTEIN: 100 mg/dL — AB
Specific Gravity, Urine: <1.005 — ABNORMAL LOW (ref 1.005–1.030)
pH: >9 — ABNORMAL HIGH (ref 5.0–8.0)

## 2016-10-09 LAB — STREP PNEUMONIAE URINARY ANTIGEN: Strep Pneumo Urinary Antigen: NEGATIVE

## 2016-10-09 LAB — URINALYSIS, MICROSCOPIC (REFLEX): SQUAMOUS EPITHELIAL / LPF: NONE SEEN

## 2016-10-09 LAB — GLUCOSE, CAPILLARY
GLUCOSE-CAPILLARY: 179 mg/dL — AB (ref 65–99)
GLUCOSE-CAPILLARY: 155 mg/dL — AB (ref 65–99)
GLUCOSE-CAPILLARY: 133 mg/dL — AB (ref 65–99)
Glucose-Capillary: 164 mg/dL — ABNORMAL HIGH (ref 65–99)

## 2016-10-09 LAB — PROCALCITONIN: Procalcitonin: <0.1 ng/mL

## 2016-10-09 LAB — INFLUENZA PANEL BY PCR (TYPE A & B)
INFLAPCR: NEGATIVE
INFLBPCR: NEGATIVE

## 2016-10-09 LAB — MRSA PCR SCREENING: MRSA by PCR: NEGATIVE

## 2016-10-09 MED ORDER — ASPIRIN EC 81 MG PO TBEC
81.0000 mg | DELAYED_RELEASE_TABLET | Freq: Every day | ORAL | Status: DC
  Administered 2016-10-09 – 2016-10-11 (×3): 81 mg via ORAL
  Filled 2016-10-09 (×3): qty 1

## 2016-10-09 MED ORDER — LABETALOL HCL 200 MG PO TABS
100.0000 mg | ORAL_TABLET | Freq: Two times a day (BID) | ORAL | Status: DC
  Administered 2016-10-09 – 2016-10-11 (×5): 100 mg via ORAL
  Filled 2016-10-09 (×5): qty 1

## 2016-10-09 MED ORDER — VANCOMYCIN HCL IN DEXTROSE 1-5 GM/200ML-% IV SOLN
1000.0000 mg | Freq: Two times a day (BID) | INTRAVENOUS | Status: DC
  Administered 2016-10-09: 1000 mg via INTRAVENOUS
  Filled 2016-10-09: qty 200

## 2016-10-09 MED ORDER — AMLODIPINE BESYLATE 5 MG PO TABS
10.0000 mg | ORAL_TABLET | Freq: Every day | ORAL | Status: DC
  Administered 2016-10-09 – 2016-10-11 (×3): 10 mg via ORAL
  Filled 2016-10-09 (×3): qty 2

## 2016-10-09 MED ORDER — FINASTERIDE 5 MG PO TABS
5.0000 mg | ORAL_TABLET | Freq: Every day | ORAL | Status: DC
  Administered 2016-10-09 – 2016-10-11 (×3): 5 mg via ORAL
  Filled 2016-10-09 (×4): qty 1

## 2016-10-09 MED ORDER — PIPERACILLIN-TAZOBACTAM 3.375 G IVPB
3.3750 g | Freq: Three times a day (TID) | INTRAVENOUS | Status: DC
  Administered 2016-10-09 – 2016-10-11 (×6): 3.375 g via INTRAVENOUS
  Filled 2016-10-09 (×6): qty 50

## 2016-10-09 MED ORDER — ONDANSETRON HCL 4 MG/2ML IJ SOLN
4.0000 mg | Freq: Three times a day (TID) | INTRAMUSCULAR | Status: DC | PRN
Start: 2016-10-09 — End: 2016-10-11
  Administered 2016-10-09: 4 mg via INTRAVENOUS
  Filled 2016-10-09: qty 2

## 2016-10-09 MED ORDER — ROSUVASTATIN CALCIUM 10 MG PO TABS
10.0000 mg | ORAL_TABLET | Freq: Every day | ORAL | Status: DC
  Administered 2016-10-09 – 2016-10-10 (×2): 10 mg via ORAL
  Filled 2016-10-09 (×2): qty 1

## 2016-10-09 MED ORDER — LEVOFLOXACIN IN D5W 750 MG/150ML IV SOLN
750.0000 mg | INTRAVENOUS | Status: DC
  Administered 2016-10-09: 750 mg via INTRAVENOUS
  Filled 2016-10-09: qty 150

## 2016-10-09 MED ORDER — SODIUM CHLORIDE 0.9 % IV SOLN
INTRAVENOUS | Status: DC
Start: 2016-10-09 — End: 2016-10-09

## 2016-10-09 MED ORDER — PIPERACILLIN-TAZOBACTAM 3.375 G IVPB
3.3750 g | Freq: Three times a day (TID) | INTRAVENOUS | Status: DC
  Administered 2016-10-09: 3.375 g via INTRAVENOUS
  Filled 2016-10-09: qty 50

## 2016-10-09 MED ORDER — PIPERACILLIN-TAZOBACTAM 3.375 G IVPB 30 MIN: 3.3750 g | Freq: Three times a day (TID) | INTRAVENOUS | Status: DC

## 2016-10-09 MED ORDER — LEVOFLOXACIN IN D5W 750 MG/150ML IV SOLN: 750.0000 mg | INTRAVENOUS | Status: DC

## 2016-10-09 MED ORDER — ACETAMINOPHEN 325 MG PO TABS
650.0000 mg | ORAL_TABLET | Freq: Four times a day (QID) | ORAL | Status: DC | PRN
  Administered 2016-10-09 (×2): 650 mg via ORAL
  Filled 2016-10-09 (×2): qty 2

## 2016-10-09 MED ORDER — SODIUM CHLORIDE 0.9 % IV SOLN
INTRAVENOUS | Status: AC
  Administered 2016-10-09: 03:00:00 via INTRAVENOUS

## 2016-10-09 MED ORDER — SODIUM CHLORIDE 0.9 % IV BOLUS (SEPSIS)
1000.0000 mL | Freq: Once | INTRAVENOUS | Status: AC
  Administered 2016-10-09: 1000 mL via INTRAVENOUS

## 2016-10-09 MED ORDER — INSULIN ASPART 100 UNIT/ML ~~LOC~~ SOLN
0.0000 [IU] | Freq: Three times a day (TID) | SUBCUTANEOUS | Status: DC
  Administered 2016-10-09 (×3): 2 [IU] via SUBCUTANEOUS
  Administered 2016-10-10: 1 [IU] via SUBCUTANEOUS
  Administered 2016-10-10 (×2): 2 [IU] via SUBCUTANEOUS
  Administered 2016-10-11: 1 [IU] via SUBCUTANEOUS

## 2016-10-09 MED ORDER — ALLOPURINOL 300 MG PO TABS
300.0000 mg | ORAL_TABLET | Freq: Every day | ORAL | Status: DC
  Administered 2016-10-09 – 2016-10-10 (×2): 300 mg via ORAL
  Filled 2016-10-09 (×2): qty 1

## 2016-10-09 NOTE — H&P (Signed)
History and Physical    [name] [id number] DOB: [birth year] DOA: 10/08/2016  PCP: Wende Neighbors, MD  Patient coming from: home  Chief Complaint:  Sob, cough, chills, rigors for one day  HPI: [name] is a 70 y.o. male with medical history significant of bladder cancer getting chemo , CAD, PVD comes in with one day of feeling awful with cough, sob, fever, chills, shaking uncontrollably at home.  No sick contacts.  Feels much better since arrival to ED, he said earlier he was shaking so much.  His temp was over 102.  Given ivf and APAP and abx and feels better.  Pt found to have likely pna, referred for admission for such.   Review of Systems: As per HPI otherwise 10 point review of systems negative.   Past Medical History:  Diagnosis Date  . Bilateral renal cysts   . Bladder cancer ([place]) UROLOGIST-  DR [facility]   RECURRENT 11/ 2017 --  hx turbt in 2004 and 08-19-2013  . BPH without obstruction/lower urinary tract symptoms   . Cervical fusion syndrome    LEFT ARM NUMBNESS--  CONTROLLED W/ GABAPENTIN  . Coronary artery disease    CARDIOLOGIST-- DR BRANCH [name])  . DDD (degenerative disc disease), cervical   . DDD (degenerative disc disease), lumbar   . Diverticulosis of sigmoid colon   . History of acute gouty arthritis   . History of adenomatous polyp of colon    2001;  2012- non-malignant leiomyoma;  04-07-2014  tubualr adenoma and hyperplastic polyp's  . History of prostate cancer UROLOGIST-  DR [facility]   S/P  RADIOACTIVE SEED IMPLANTS 2004  . HOH (hard of hearing)    no hearing aids  . Hyperlipidemia   . Hypertension   . OA (osteoarthritis)   . Occlusion and stenosis of carotid artery without mention of cerebral infarction CARDIOLOGIST  -- DR  [name]   LAST DUPLEX  11-23-2015  RICA  (DUPLEX DOPPLER 01-20-2016 RICA [id number] PROXIMAL) /   LICA [id number] (HAD CONSULT W/ [name] , VASCULAR SURGEON 01-20-2016)  . OSA on CPAP    BiPAP  . Spondylosis, cervical    . Type 2 diabetes mellitus ([place])   . Wears glasses     Past Surgical History:  Procedure Laterality Date  . ANTERIOR REMOVAL CAGE AND PLATE C3-C7/ CORPECTOMY C7 (FX)/  ALLOGRAFT AND FUSION C3 -- T1/  POSTERIOR DECOMPRESSION BILATERAL LAMINECTOMY C4 -- 6 & PARTIAL C3/  POSTEROLATERAL ARTHRODESIS C3 - T1  06/05/2005  . APPENDECTOMY  1990'S  . CATARACT EXTRACTION W/ INTRAOCULAR LENS  IMPLANT, BILATERAL  2013  . CERVICAL FUSION  05/11/2005   C3  --  C7/  due to post op quadriparesis same day s/p  anterior C 4,5,6, colpectomy, decompression, removal epidural hematoma, foraminnotomy, cage and plate  . COLONOSCOPY  last one 04-07-2014  . CYSTOSCOPY W/ RETROGRADES Bilateral 08/19/2013   Procedure: CYSTOSCOPY WITH RETROGRADE PYELOGRAM;  Surgeon: Molli Hazard, MD;  Location[facility];  Service: Urology;  Laterality: Bilateral;  . CYSTOSCOPY W/ RETROGRADES Bilateral 07/18/2016   Procedure: CYSTOSCOPY WITH RETROGRADE PYELOGRAM;  Surgeon: Alexis Frock, MD;  Location: [facility];  Service: Urology;  Laterality: Bilateral;  . ENDOSCOPIC REPAIR CSF LEAK VIA NASAL PASSAGE  2011  . INTRAOPERATIVE ARTERIOGRAM  CATH LAB  01-05-2009  DR Einar Gip   RICA   ACUTE ANGLE 80-85% STENOSIS  . LUMBAR FUSION  03-26-2012   L3 --  L5  .  RADIOACTIVE PROSTATE SEED IMPLANTS  08-19-2003  . SEPTOPLASTY  1998  . TRANSURETHRAL RESECTION OF BLADDER TUMOR N/A 08/19/2013   Procedure: TRANSURETHRAL RESECTION OF BLADDER TUMOR (TURBT);  Surgeon: Molli Hazard, MD;  Location: [facility];  Service: Urology;  Laterality: N/A;  . TRANSURETHRAL RESECTION OF BLADDER TUMOR N/A 07/18/2016   Procedure: TRANSURETHRAL RESECTION OF BLADDER TUMOR (TURBT);  Surgeon: Alexis Frock, MD;  Location: [facility];  Service: Urology;  Laterality: N/A;  . YAG LASER APPLICATION  [id number]   Procedure: YAG LASER APPLICATION;  Surgeon: Elta Guadeloupe T. Gershon Crane, MD;  Location: [facility];   Service: Ophthalmology;  Laterality: Right;     reports that he quit smoking about 4 months ago. His smoking use included Cigarettes. He has a 94.00 pack-year smoking history. He has never used smokeless tobacco. He reports that he does not drink alcohol or use drugs.  Allergies  Allergen Reactions  . Dilaudid [Hydromorphone Hcl] Other (See Comments)    "acts a little off"  . Toradol [Ketorolac Tromethamine] Other (See Comments)    CauseD  hallucination ?-states not sure/ avoids per his MD  . Lyrica [Pregabalin] Nausea Only and Other (See Comments)    Make me feel "bad"    Family History  Problem Relation Age of Onset  . Breast cancer Mother     mets  . Diabetes Mother   . Heart disease Father     MI  . Colon cancer Brother   . Cancer Sister     male organs  . Diabetes Sister     family hx  . Arthritis Other     entire family  . Diabetes Brother     x 2    Prior to Admission medications   Medication Sig Start Date End Date Taking? Authorizing Provider  allopurinol (ZYLOPRIM) 300 MG tablet Take 300 mg by mouth at bedtime.     Historical Provider, MD  amLODipine (NORVASC) 10 MG tablet TAKE 1 TABLET BY MOUTH EVERY DAY Patient taking differently: TAKE 1 TABLET BY MOUTH EVERY DAY-- takes in am 04/04/16   Minna Merritts, MD  aspirin 81 MG EC tablet Take 81 mg by mouth at bedtime.     Historical Provider, MD  celecoxib (CELEBREX) 200 MG capsule Take 200 mg by mouth every morning.    Historical Provider, MD  colchicine 0.6 MG tablet Take 0.6 mg by mouth daily as needed. For gout    Historical Provider, MD  docusate sodium (COLACE) 100 MG capsule Take 200 mg by mouth every evening.     Historical Provider, MD  dutasteride (AVODART) 0.5 MG capsule Take 0.5 mg by mouth every morning.     Historical Provider, MD  finasteride (PROSCAR) 5 MG tablet Take 5 mg by mouth at bedtime.    Historical Provider, MD  furosemide (LASIX) 40 MG tablet Take 40 mg by mouth every morning.      Historical Provider, MD  gabapentin (NEURONTIN) 300 MG capsule Take 300 mg by mouth 3 (three) times daily.      Historical Provider, MD  ibuprofen (ADVIL,MOTRIN) 200 MG tablet Take 800 mg by mouth every 8 (eight) hours as needed for moderate pain. Reported on 01/20/2016    Historical Provider, MD  insulin detemir (LEVEMIR) 100 UNIT/ML injection Inject 25 Units into the skin at bedtime.     Historical Provider, MD  labetalol (NORMODYNE) 100 MG tablet Take 300 mg by mouth 2 (two) times daily. Take 3 (100 mg) tablets  twice a day.    Historical Provider, MD  losartan (COZAAR) 100 MG tablet TAKE 1 TABLET BY MOUTH DAILY 09/05/16   Arnoldo Lenis, MD  losartan (COZAAR) 100 MG tablet TAKE 1 TABLET BY MOUTH DAILY 10/01/16   Arnoldo Lenis, MD  Naproxen Sodium (ALEVE) 220 MG CAPS Take 1 capsule by mouth daily as needed.    Historical Provider, MD  omega-3 acid ethyl esters (LOVAZA) 1 G capsule Take 1 g by mouth 2 (two) times daily.     Historical Provider, MD  oxyCODONE-acetaminophen (ROXICET) 5-325 MG tablet Take 1 tablet by mouth every 6 (six) hours as needed for severe pain. Post-operatively 07/18/16   Alexis Frock, MD  polyethylene glycol powder (GLYCOLAX/MIRALAX) powder TAKE 17MG (1CAPFUL) BY MOUTH(MIXED WITH WATER/JUICE) DAILY Patient taking differently: TAKE 17MG (1CAPFUL) BY MOUTH(MIXED WITH WATER/JUICE) DAILY--  takes in pm 05/02/15   [place] Dragon, MD  rosuvastatin (CRESTOR) 10 MG tablet Take 10 mg by mouth every morning.     Historical Provider, MD  senna-docusate (SENOKOT-S) 8.6-50 MG tablet Take 1 tablet by mouth 2 (two) times daily. While taking pain meds to prevent constipation. 07/18/16   Alexis Frock, MD  Tamsulosin HCl (FLOMAX) 0.4 MG CAPS Take 0.4 mg by mouth at bedtime. Reported on 01/20/2016    Historical Provider, MD  tiZANidine (ZANAFLEX) 4 MG tablet Take 4 mg by mouth every 6 (six) hours as needed for muscle spasms. Reported on 01/20/2016    Historical Provider, MD    Physical  Exam: Vitals:   10/09/16 0017 10/09/16 0030 10/09/16 0100 10/09/16 0113  BP: 125/72 141/66 125/63   Pulse: 85 83 81   Resp: 16 26 21    Temp:    99.3 F (37.4 C)  TempSrc:    Oral  SpO2: 93% 92% 96%   Weight:      Height:        Constitutional: NAD, calm, comfortable Vitals:   10/09/16 0017 10/09/16 0030 10/09/16 0100 10/09/16 0113  BP: 125/72 141/66 125/63   Pulse: 85 83 81   Resp: 16 26 21    Temp:    99.3 F (37.4 C)  TempSrc:    Oral  SpO2: 93% 92% 96%   Weight:      Height:       Eyes: PERRL, lids and conjunctivae normal ENMT: Mucous membranes are moist. Posterior pharynx clear of any exudate or lesions.Normal dentition.  Neck: normal, supple, no masses, no thyromegaly Respiratory: clear to auscultation bilaterally, no wheezing, no crackles. Normal respiratory effort. No accessory muscle use.  Cardiovascular: Regular rate and rhythm, no murmurs / rubs / gallops. No extremity edema. 2+ pedal pulses. No carotid bruits.  Abdomen: no tenderness, no masses palpated. No hepatosplenomegaly. Bowel sounds positive.  Musculoskeletal: no clubbing / cyanosis. No joint deformity upper and lower extremities. Good ROM, no contractures. Normal muscle tone.  Skin: no rashes, lesions, ulcers. No induration Neurologic: CN 2-12 grossly intact. Sensation intact, DTR normal. Strength 5/5 in all 4.  Psychiatric: Normal judgment and insight. Alert and oriented x 3. Normal mood.    Labs on Admission: I have personally reviewed following labs and imaging studies  CBC:  Recent Labs Lab 10/08/16 2251  WBC 11.3*  NEUTROABS 8.4*  HGB 13.5  HCT 39.5  MCV 91.4  PLT [id number]*   Basic Metabolic Panel:  Recent Labs Lab 10/08/16 2251  NA 138  K 3.6  CL 104  CO2 25  GLUCOSE 136*  BUN 16  CREATININE 1.06  CALCIUM  9.2   GFR: Estimated Creatinine Clearance: 76 mL/min (by C-G formula based on SCr of 1.06 mg/dL). Liver Function Tests:  Recent Labs Lab 10/08/16 2251  AST 19  ALT 21   ALKPHOS 56  BILITOT 0.9  PROT 7.8  ALBUMIN 4.5      Radiological Exams on Admission: Dg Chest 2 View  Result Date: 10/09/2016 CLINICAL DATA:  Acute onset of generalized body pain and fever. Uncontrollable shaking. Initial encounter. EXAM: CHEST  2 VIEW COMPARISON:  Chest radiograph performed 02/04/2014 FINDINGS: The lungs are well-aerated. Minimal left basilar opacity raises concern for infection, depending on the patient's symptoms. There is no evidence of pleural effusion or pneumothorax. The heart is normal in size; the mediastinal contour is within normal limits. No acute osseous abnormalities are seen. Cervical spinal fusion hardware is partially imaged. IMPRESSION: Minimal left basilar airspace opacity raises concern for infection, depending on the patient's symptoms. Electronically Signed   By: Garald Balding M.D.   On: 10/09/2016 00:08    EKG: Independently reviewed.  nsr no acute issues cxr reviewed possible lll haziness or early developing infiltrate, no edema  Assessment/Plan 71 yo male h/o bladder cancer undergoing BCG treatments comes in with one day of rigors, chills, cough, fever has CAP  Principal Problem:   PNA (pneumonia)- with SIRS criteria- treat broadly due to chemo status.  Iv vanc and zosyn.  Flu neg.  Feeling better with treating his high fever.  Blood and sputum cx obtained.    Active Problems:   Sepsis (HCC)/SIRS - given boluses in ED, will cont with gentle ivf overnight as his bp is normal   Malignant neoplasm of bladder ([place])- noted   Carotid artery disease without cerebral infarction ([place])- stable and noted   CAD (coronary artery disease)- stable   DM type 2 (diabetes mellitus, type 2) ([place])- place on SSI    DVT prophylaxis:  scds  Code Status:  full Family Communication:none Disposition Plan:  Per day team Consults called:  none Admission status:  observation   Esiah Bazinet A MD [facility]  If 7PM-7AM, please contact  night-coverage [email or web address] Password [email or web address]  10/09/2016, 1:36 AM

## 2016-10-09 NOTE — Progress Notes (Addendum)
Pharmacy Antibiotic Note  [name] is a 71 y.o. male admitted on 10/08/2016 with CAP.  Pharmacy has been consulted for [facility] dosing.  Plan:  Levaquin 750mg  IV q24hrs, switch to PO when improved / appropriate Monitor labs, renal fxn, progress and c/s  Height: 5\' 8"  (172.7 cm) Weight: 215 lb 6.2 oz (97.7 kg) IBW/kg (Calculated) : 68.4  Temp (24hrs), Avg:99.8 F (37.7 C), Min:98.6 F (37 C), Max:102.3 F (39.1 C)   Recent Labs Lab 10/08/16 2251 10/08/16 2302 10/09/16 0201 10/09/16 0404  WBC 11.3*  --   --  8.8  CREATININE 1.06  --   --  1.01  LATICACIDVEN  --  1.07 1.27  --     Estimated Creatinine Clearance: 76 mL/min (by C-G formula based on SCr of 1.01 mg/dL).    Allergies  Allergen Reactions  . Dilaudid [Hydromorphone Hcl] Other (See Comments)    "acts a little off"  . Toradol [Ketorolac Tromethamine] Other (See Comments)    CauseD  hallucination ?-states not sure/ avoids per his MD  . Lyrica [Pregabalin] Nausea Only and Other (See Comments)    Make me feel "bad"   Antimicrobials this admission: Vancomycin 1/22 >> 1/23 Zosyn 1/22 >> 1/23 Levaquin 1/23 >>  Dose adjustments this admission:  Microbiology results:  BCx: pending  UCx: pending   Sputum:    MRSA PCR:   Thank you for allowing pharmacy to be a part of this patient's care.  Hart Robinsons A 10/09/2016 9:16 AM

## 2016-10-09 NOTE — Progress Notes (Addendum)
PROGRESS NOTE    [name]  [id number] DOB: 01-Aug-1946 DOA: 10/08/2016 PCP: Wende Neighbors, MD   Brief Narrative: 71 y.o. male with medical history significant of bladder cancer getting chemo , CAD, PVD comes in with one day of feeling awful with cough, sob, fever, chills, shaking uncontrollably at home. Patient was admitted earlier today, refer to H&P from today for detail.   Assessment & Plan:  # Possible left lower lobe pneumonia/SIRS:  - Left basilar airspace opacity. Patient with shortness of breath, cough, fever and leukocytosis on admission. -Influenza negative. -She is currently on vancomycin and Zosyn. MRSA screen negative. I will discontinue both antibiotics and switch to Levaquin. -Continue to monitor clinical response -Follow-up culture results. Patient reported clinically improving.  #Type 2 diabetes: Continue sliding scale. Monitor blood sugar level.  Resume some of the home medications including aspirin, labetalol, allopurinol, Proscar, Crestor. Monitor blood pressure.  Principal Problem:   PNA (pneumonia) Active Problems:   Malignant neoplasm of bladder [facility]): Recommended to follow-up with oncologist outpatient.   Carotid artery disease without cerebral infarction (HCC)   CAD (coronary artery disease)    DM type 2 (diabetes mellitus, type 2) (HCC)  DVT prophylaxis: SCD. Patient has mild thrombocytopenia. Code Status: Full code Family Communication: No family present at bedside Disposition Plan: Likely discharge home in 1 day  Consultants:   None  Procedures: None Antimicrobials: Discontinue vancomycin and Zosyn and switch to oral Levaquin.  Subjective: Patient was seen and examined at bedside. Reported feeling better but is still has dry cough and mild shortness of breath. Denied headache, nausea, vomiting, chest pain.   Objective: Vitals:   10/09/16 0239 10/09/16 0400 10/09/16 0801 10/09/16 0859  BP: (!) 151/63   (!) 116/51  Pulse: 71   73  Resp:  19   17  Temp: 98.6 F (37 C) 99.2 F (37.3 C) 99.3 F (37.4 C)   TempSrc: Oral Oral Oral   SpO2: 97%   98%  Weight: 97.7 kg (215 lb 6.2 oz)     Height: 5\' 8"  (1.727 m)       Intake/Output Summary (Last 24 hours) at 10/09/16 1130 Last data filed at 10/09/16 0941  Gross per 24 hour  Intake              840 ml  Output              675 ml  Net              165 ml   Filed Weights   10/08/16 2238 10/09/16 0239  Weight: 104.3 kg (230 lb) 97.7 kg (215 lb 6.2 oz)    Examination:  General exam: Appears calm and comfortable  Respiratory system: Bibasal crackles, no wheezing. Respiratory effort normal.  Cardiovascular system: S1 & S2 heard, RRR.  Trace pedal edema. Gastrointestinal system: Abdomen is nondistended, soft and nontender. Normal bowel sounds heard. Central nervous system: Alert and oriented. No focal neurological deficits. Extremities: Symmetric 5 x 5 power. Skin: No rashes, lesions or ulcers Psychiatry: Judgement and insight appear normal. Mood & affect appropriate.     Data Reviewed: I have personally reviewed following labs and imaging studies  CBC:  Recent Labs Lab 10/08/16 2251 10/09/16 0404  WBC 11.3* 8.8  NEUTROABS 8.4* 7.0  HGB 13.5 13.3  HCT 39.5 38.6*  MCV 91.4 91.7  PLT 143* Q000111Q*   Basic Metabolic Panel:  Recent Labs Lab 10/08/16 2251 10/09/16 0404  NA 138 138  K 3.6  3.5  CL 104 104  CO2 25 26  GLUCOSE 136* 141*  BUN 16 15  CREATININE 1.06 1.01  CALCIUM 9.2 8.7*   GFR: Estimated Creatinine Clearance: 76 mL/min (by C-G formula based on SCr of 1.01 mg/dL). Liver Function Tests:  Recent Labs Lab 10/08/16 2251  AST 19  ALT 21  ALKPHOS 56  BILITOT 0.9  PROT 7.8  ALBUMIN 4.5   No results for input(s): LIPASE, AMYLASE in the last 168 hours. No results for input(s): AMMONIA in the last 168 hours. Coagulation Profile: No results for input(s): INR, PROTIME in the last 168 hours. Cardiac Enzymes: No results for input(s): CKTOTAL,  CKMB, CKMBINDEX, TROPONINI in the last 168 hours. BNP (last 3 results) No results for input(s): PROBNP in the last 8760 hours. HbA1C: No results for input(s): HGBA1C in the last 72 hours. CBG:  Recent Labs Lab 10/09/16 0800 10/09/16 1104  GLUCAP 179* 155*   Lipid Profile: No results for input(s): CHOL, HDL, LDLCALC, TRIG, CHOLHDL, LDLDIRECT in the last 72 hours. Thyroid Function Tests: No results for input(s): TSH, T4TOTAL, FREET4, T3FREE, THYROIDAB in the last 72 hours. Anemia Panel: No results for input(s): VITAMINB12, FOLATE, FERRITIN, TIBC, IRON, RETICCTPCT in the last 72 hours. Sepsis Labs:  Recent Labs Lab 10/08/16 2251 10/08/16 2302 10/09/16 0201  PROCALCITON <0.10  --   --   LATICACIDVEN  --  1.07 1.27    Recent Results (from the past 240 hour(s))  Blood Culture (routine x 2)     Status: None (Preliminary result)   Collection Time: 10/08/16 10:51 PM  Result Value Ref Range Status   Specimen Description BLOOD RIGHT ANTECUBITAL  Final   Special Requests BOTTLES DRAWN AEROBIC AND ANAEROBIC 6CC EACH  Final   Culture NO GROWTH < 12 HOURS  Final   Report Status PENDING  Incomplete  Blood Culture (routine x 2)     Status: None (Preliminary result)   Collection Time: 10/08/16 11:05 PM  Result Value Ref Range Status   Specimen Description BLOOD RIGHT FOREARM  Final   Special Requests BOTTLES DRAWN AEROBIC AND ANAEROBIC 8CC EACH  Final   Culture NO GROWTH < 12 HOURS  Final   Report Status PENDING  Incomplete  MRSA PCR Screening     Status: None   Collection Time: 10/09/16  2:20 AM  Result Value Ref Range Status   MRSA by PCR NEGATIVE NEGATIVE Final    Comment:        The GeneXpert MRSA Assay (FDA approved for NASAL specimens only), is one component of a comprehensive MRSA colonization surveillance program. It is not intended to diagnose MRSA infection nor to guide or monitor treatment for MRSA infections.          Radiology Studies: Dg Chest 2  View  Result Date: 10/09/2016 CLINICAL DATA:  Acute onset of generalized body pain and fever. Uncontrollable shaking. Initial encounter. EXAM: CHEST  2 VIEW COMPARISON:  Chest radiograph performed 02/04/2014 FINDINGS: The lungs are well-aerated. Minimal left basilar opacity raises concern for infection, depending on the patient's symptoms. There is no evidence of pleural effusion or pneumothorax. The heart is normal in size; the mediastinal contour is within normal limits. No acute osseous abnormalities are seen. Cervical spinal fusion hardware is partially imaged. IMPRESSION: Minimal left basilar airspace opacity raises concern for infection, depending on the patient's symptoms. Electronically Signed   By: Garald Balding M.D.   On: 10/09/2016 00:08        Scheduled Meds: .  insulin aspart  0-9 Units Subcutaneous TID WC  . piperacillin-tazobactam (ZOSYN)  IV  3.375 g Intravenous Q8H  . vancomycin  1,000 mg Intravenous Q12H   Continuous Infusions: . sodium chloride 75 mL/hr at 10/09/16 0244     LOS: 0 days    Keyandra Swenson Tanna Furry, MD [facility] Pager [phone]  If 7PM-7AM, please contact night-coverage [email or web address] Password [email or web address] 10/09/2016, 11:30 AM

## 2016-10-09 NOTE — Progress Notes (Signed)
Patient began to have chills, checked temp orally and was 99.55F. Patient still having chills so checked rectally and was 101.574F,gave tylenol PRN order. Rechecked temp orally and was 102.74F put ice packs under armpits and made MD aware via text page. Temp then 103.56F orally. Completed MD orders temp 99.3 oral

## 2016-10-09 NOTE — Progress Notes (Signed)
Blood culture resulted, positive for enterobacteriaceae and proteus. MD made aware, will continue to monitor.

## 2016-10-09 NOTE — Progress Notes (Signed)
Addendum:   Received culture result.   One out of two bottles of blood culture positive for gram negative rod.  We will restart IV zosyn.  Follow up final culture.  Pt is febrile, BP elevated.  Tylenol prn,  Close monitoring, d/w Rn. Change to inpatient status.

## 2016-10-10 ENCOUNTER — Inpatient Hospital Stay (HOSPITAL_COMMUNITY): Payer: Medicare HMO

## 2016-10-10 DIAGNOSIS — I2510 Atherosclerotic heart disease of native coronary artery without angina pectoris: Secondary | ICD-10-CM

## 2016-10-10 LAB — GLUCOSE, CAPILLARY
Glucose-Capillary: 150 mg/dL — ABNORMAL HIGH (ref 65–99)
Glucose-Capillary: 166 mg/dL — ABNORMAL HIGH (ref 65–99)
Glucose-Capillary: 160 mg/dL — ABNORMAL HIGH (ref 65–99)
Glucose-Capillary: 124 mg/dL — ABNORMAL HIGH (ref 65–99)

## 2016-10-10 MED ORDER — CELECOXIB 100 MG PO CAPS
200.0000 mg | ORAL_CAPSULE | Freq: Every day | ORAL | Status: DC
  Administered 2016-10-10 – 2016-10-11 (×2): 200 mg via ORAL
  Filled 2016-10-10 (×2): qty 2

## 2016-10-10 MED ORDER — IBUPROFEN 400 MG PO TABS
400.0000 mg | ORAL_TABLET | Freq: Four times a day (QID) | ORAL | Status: DC | PRN
  Administered 2016-10-10 – 2016-10-11 (×3): 400 mg via ORAL
  Filled 2016-10-10 (×3): qty 1

## 2016-10-10 NOTE — Progress Notes (Signed)
PROGRESS NOTE                                                                                                                                                                                                             Patient Demographics:    [name], is a 71 y.o. male, DOB - [birth year], NP:[id number]  Admit date - 10/08/2016   Admitting Physician Phillips Grout, MD  Outpatient Primary MD for the patient is Wende Neighbors, MD  LOS - 1  Chief Complaint  Patient presents with  . Fever       Brief Narrative   71 y.o.malewith medical history significant of bladder cancer getting chemo , CAD, PVD comes in with one day of feeling awful with cough, sob, fever, chills, shaking uncontrollably at home.He appears to have gram-negative bacteria due to UTI. No clinical signs of pneumonia.   Subjective:    [name] today has, No headache, No chest pain, No abdominal pain - No Nausea, No new weakness tingling or numbness, No Cough - SOB.     Assessment  & Plan :      1.UTI with gram-negative bacteremia and sepsis. Sepsis physiology has resolved, he appears nontoxic and is currently fever free, final culture sensitivity pending. Continue Zosyn. Continue supportive care. No pneumonia clinically.  2. History of gout. On allopurinol continue.  3. Essential hypertension. Currently on Norvasc and beta blocker continue  4. Dyslipidemia. On statin stable.  5. CAD/PAD. No acute issues continue combination of aspirin, statin and beta blocker.  6. BPH. Continue alpha blocker.   7. OSA. On CPAP daily at bedtime.   8. DM type II. On sliding scale.  CBG (last 3)   Recent Labs  10/09/16 1640 10/09/16 2051 10/10/16 0727  GLUCAP 164* 133* 150*      Family Communication  :  None  Code Status :  Full  Diet : Diet heart healthy/carb modified Room service appropriate? Yes; Fluid consistency: Thin   Disposition Plan  :   TBD  Consults  :  None  Procedures  :  None  DVT Prophylaxis  : SCDs    Lab Results  Component Value Date   PLT 129 (L) 10/09/2016    Inpatient Medications  Scheduled Meds: . allopurinol  300 mg Oral QHS  . amLODipine  10 mg Oral  Daily  . aspirin EC  81 mg Oral Daily  . finasteride  5 mg Oral Daily  . insulin aspart  0-9 Units Subcutaneous TID WC  . labetalol  100 mg Oral BID  . piperacillin-tazobactam (ZOSYN)  IV  3.375 g Intravenous Q8H  . rosuvastatin  10 mg Oral q1800   Continuous Infusions: PRN Meds:.acetaminophen, ondansetron (ZOFRAN) IV  Antibiotics  :    Anti-infectives    Start     Dose/Rate Route Frequency Ordered Stop   10/10/16 1200  levofloxacin (LEVAQUIN) IVPB 750 mg  Status:  Discontinued     750 mg 100 mL/hr over 90 Minutes Intravenous Every 24 hours 10/09/16 1539 10/09/16 1541   10/09/16 1545  piperacillin-tazobactam (ZOSYN) IVPB 3.375 g  Status:  Discontinued     3.375 g 100 mL/hr over 30 Minutes Intravenous Every 8 hours 10/09/16 1530 10/09/16 1537   10/09/16 1545  piperacillin-tazobactam (ZOSYN) IVPB 3.375 g     3.375 g 12.5 mL/hr over 240 Minutes Intravenous Every 8 hours 10/09/16 1538     10/09/16 1200  levofloxacin (LEVAQUIN) IVPB 750 mg  Status:  Discontinued     750 mg 100 mL/hr over 90 Minutes Intravenous Every 24 hours 10/09/16 1145 10/09/16 1530   10/09/16 1000  vancomycin (VANCOCIN) IVPB 1000 mg/200 mL premix  Status:  Discontinued     1,000 mg 200 mL/hr over 60 Minutes Intravenous Every 12 hours 10/09/16 0738 10/09/16 1140   10/09/16 0800  piperacillin-tazobactam (ZOSYN) IVPB 3.375 g  Status:  Discontinued     3.375 g 12.5 mL/hr over 240 Minutes Intravenous Every 8 hours 10/09/16 0738 10/09/16 1140   10/08/16 2345  vancomycin (VANCOCIN) IVPB 1000 mg/200 mL premix     1,000 mg 200 mL/hr over 60 Minutes Intravenous  Once 10/08/16 2335 10/09/16 0113   10/08/16 2330  piperacillin-tazobactam (ZOSYN) IVPB 3.375 g     3.375 g 100 mL/hr  over 30 Minutes Intravenous  Once 10/08/16 2322 10/09/16 0036   10/08/16 2330  vancomycin (VANCOCIN) IVPB 1000 mg/200 mL premix     1,000 mg 200 mL/hr over 60 Minutes Intravenous  Once 10/08/16 2322 10/09/16 0215         Objective:   Vitals:   10/09/16 1740 10/09/16 2000 10/09/16 2133 10/10/16 0641  BP: (!) 143/81 (!) 167/63 (!) 148/79 132/77  Pulse:  87 84 86  Resp:  18 20 20   Temp:  98.7 F (37.1 C) 97.9 F (36.6 C) 98.2 F (36.8 C)  TempSrc:  Oral Oral Oral  SpO2:  96% 95% 95%  Weight:      Height:        Wt Readings from Last 3 Encounters:  10/09/16 97.7 kg (215 lb 6.2 oz)  07/18/16 99.3 kg (219 lb)  05/31/16 95.7 kg (211 lb)     Intake/Output Summary (Last 24 hours) at 10/10/16 1023 Last data filed at 10/10/16 0641  Gross per 24 hour  Intake              765 ml  Output              800 ml  Net              -35 ml     Physical Exam  Awake Alert, Oriented X 3, No new F.N deficits, Normal affect [place].AT,PERRAL Supple Neck,No JVD, No cervical lymphadenopathy appriciated.  Symmetrical Chest wall movement, Good air movement bilaterally, CTAB RRR,No Gallops,Rubs or new Murmurs, No Parasternal Heave +ve  B.Sounds, Abd Soft, No tenderness, No organomegaly appriciated, No rebound - guarding or rigidity. No Cyanosis, Clubbing or edema, No new Rash or bruise      Data Review:    CBC  Recent Labs Lab 10/08/16 2251 10/09/16 0404  WBC 11.3* 8.8  HGB 13.5 13.3  HCT 39.5 38.6*  PLT 143* 129*  MCV 91.4 91.7  MCH 31.3 31.6  MCHC 34.2 34.5  RDW 13.3 13.5  LYMPHSABS 1.1 1.1  MONOABS 1.4* 0.6  EOSABS 0.3 0.2  BASOSABS 0.0 0.0    Chemistries   Recent Labs Lab 10/08/16 2251 10/09/16 0404  NA 138 138  K 3.6 3.5  CL 104 104  CO2 25 26  GLUCOSE 136* 141*  BUN 16 15  CREATININE 1.06 1.01  CALCIUM 9.2 8.7*  AST 19  --   ALT 21  --   ALKPHOS 56  --   BILITOT 0.9  --     ------------------------------------------------------------------------------------------------------------------ No results for input(s): CHOL, HDL, LDLCALC, TRIG, CHOLHDL, LDLDIRECT in the last 72 hours.  No results found for: HGBA1C ------------------------------------------------------------------------------------------------------------------ No results for input(s): TSH, T4TOTAL, T3FREE, THYROIDAB in the last 72 hours.  Invalid input(s): FREET3 ------------------------------------------------------------------------------------------------------------------ No results for input(s): VITAMINB12, FOLATE, FERRITIN, TIBC, IRON, RETICCTPCT in the last 72 hours.  Coagulation profile No results for input(s): INR, PROTIME in the last 168 hours.  No results for input(s): DDIMER in the last 72 hours.  Cardiac Enzymes No results for input(s): CKMB, TROPONINI, MYOGLOBIN in the last 168 hours.  Invalid input(s): CK ------------------------------------------------------------------------------------------------------------------    Component Value Date/Time   BNP 25.0 10/08/2016 2251    Micro Results Recent Results (from the past 240 hour(s))  Blood Culture (routine x 2)     Status: None (Preliminary result)   Collection Time: 10/08/16 10:51 PM  Result Value Ref Range Status   Specimen Description BLOOD RIGHT ANTECUBITAL  Final   Special Requests BOTTLES DRAWN AEROBIC AND ANAEROBIC [place]  Final   Culture NO GROWTH 2 DAYS  Final   Report Status PENDING  Incomplete  Blood Culture (routine x 2)     Status: None (Preliminary result)   Collection Time: 10/08/16 11:05 PM  Result Value Ref Range Status   Specimen Description BLOOD RIGHT FOREARM  Final   Special Requests BOTTLES DRAWN AEROBIC AND ANAEROBIC 8CC EACH  Final   Culture  Setup Time   Final    GRAM NEGATIVE RODS ANAEROBIC BOTTLE ONLY Gram Stain Report Called to,Read Back By and Verified With: FREEMAN L. AT [id number] ON [id number]  BY THOMPSON S. Organism ID to follow CRITICAL RESULT CALLED TO, READ BACK BY AND VERIFIED WITH: H TETREAULT RN 2052 10/09/16 A BROWNING GRAM NEGATIVE RODS AEB BOTTLE AT [facility] CRITICAL VALUE NOTED.  VALUE IS CONSISTENT WITH PREVIOUSLY REPORTED AND CALLED VALUE.    Culture NO GROWTH 2 DAYS  Final   Report Status PENDING  Incomplete  Blood Culture ID Panel (Reflexed)     Status: Abnormal   Collection Time: 10/08/16 11:05 PM  Result Value Ref Range Status   Enterococcus species NOT DETECTED NOT DETECTED Final   Listeria monocytogenes NOT DETECTED NOT DETECTED Final   Staphylococcus species NOT DETECTED NOT DETECTED Final   Staphylococcus aureus NOT DETECTED NOT DETECTED Final   Streptococcus species NOT DETECTED NOT DETECTED Final   Streptococcus agalactiae NOT DETECTED NOT DETECTED Final   Streptococcus pneumoniae NOT DETECTED NOT DETECTED Final   Streptococcus pyogenes NOT DETECTED NOT DETECTED Final   Acinetobacter baumannii NOT  DETECTED NOT DETECTED Final   Enterobacteriaceae species DETECTED (A) NOT DETECTED Final    Comment: CRITICAL RESULT CALLED TO, READ BACK BY AND VERIFIED WITH: H TETREAULT RN 2052 10/09/16 A BROWNING    Enterobacter cloacae complex NOT DETECTED NOT DETECTED Final   Escherichia coli NOT DETECTED NOT DETECTED Final   Klebsiella oxytoca NOT DETECTED NOT DETECTED Final   Klebsiella pneumoniae NOT DETECTED NOT DETECTED Final   Proteus species DETECTED (A) NOT DETECTED Final    Comment: CRITICAL RESULT CALLED TO, READ BACK BY AND VERIFIED WITH: H TETREAULT RN 2052 10/09/16 A BROWNING    Serratia marcescens NOT DETECTED NOT DETECTED Final   Carbapenem resistance NOT DETECTED NOT DETECTED Final   Haemophilus influenzae NOT DETECTED NOT DETECTED Final   Neisseria meningitidis NOT DETECTED NOT DETECTED Final   Pseudomonas aeruginosa NOT DETECTED NOT DETECTED Final   Candida albicans NOT DETECTED NOT DETECTED Final   Candida glabrata NOT DETECTED NOT DETECTED Final    Candida krusei NOT DETECTED NOT DETECTED Final   Candida parapsilosis NOT DETECTED NOT DETECTED Final   Candida tropicalis NOT DETECTED NOT DETECTED Final    Comment: Performed at [facility], [address], [place], [place] [address]  MRSA PCR Screening     Status: None   Collection Time: 10/09/16  2:20 AM  Result Value Ref Range Status   MRSA by PCR NEGATIVE NEGATIVE Final    Comment:        The GeneXpert MRSA Assay (FDA approved for NASAL specimens only), is one component of a comprehensive MRSA colonization surveillance program. It is not intended to diagnose MRSA infection nor to guide or monitor treatment for MRSA infections.     Radiology Reports Dg Chest 2 View  Result Date: 10/09/2016 CLINICAL DATA:  Acute onset of generalized body pain and fever. Uncontrollable shaking. Initial encounter. EXAM: CHEST  2 VIEW COMPARISON:  Chest radiograph performed 02/04/2014 FINDINGS: The lungs are well-aerated. Minimal left basilar opacity raises concern for infection, depending on the patient's symptoms. There is no evidence of pleural effusion or pneumothorax. The heart is normal in size; the mediastinal contour is within normal limits. No acute osseous abnormalities are seen. Cervical spinal fusion hardware is partially imaged. IMPRESSION: Minimal left basilar airspace opacity raises concern for infection, depending on the patient's symptoms. Electronically Signed   By: Garald Balding M.D.   On: 10/09/2016 00:08   Dg Chest Port 1 View  Result Date: 10/10/2016 CLINICAL DATA:  Shortness of breath. EXAM: PORTABLE CHEST 1 VIEW COMPARISON:  10/08/2016 FINDINGS: The cardiac silhouette, mediastinal and hilar contours are within normal limits and stable. Mild tortuosity and calcification of the thoracic aorta. No acute pulmonary findings. No pleural effusion. The bony thorax is intact. IMPRESSION: No acute cardiopulmonary findings. Electronically Signed   By: Marijo Sanes M.D.   On:  10/10/2016 08:21    Time Spent in minutes  30   Elmarie Devlin K M.D on 10/10/2016 at 10:23 AM  Between 7am to 7pm - Pager - [phone]  After 7pm go to [email or web address] - password [facility]  [facility] -  Office  [phone]

## 2016-10-11 DIAGNOSIS — J181 Lobar pneumonia, unspecified organism: Secondary | ICD-10-CM

## 2016-10-11 LAB — CULTURE, BLOOD (ROUTINE X 2)

## 2016-10-11 LAB — GLUCOSE, CAPILLARY: Glucose-Capillary: 138 mg/dL — ABNORMAL HIGH (ref 65–99)

## 2016-10-11 MED ORDER — CEFPODOXIME PROXETIL 200 MG PO TABS: 200.0000 mg | ORAL_TABLET | Freq: Two times a day (BID) | ORAL | 0 refills | Status: DC

## 2016-10-11 NOTE — Discharge Instructions (Signed)
Follow with Primary MD Wende Neighbors, MD in 7 days   Get CBC, CMP, 2 view Chest X ray checked  by Primary MD or SNF MD in 5-7 days ( we routinely change or add medications that can affect your baseline labs and fluid status, therefore we recommend that you get the mentioned basic workup next visit with your PCP, your PCP may decide not to get them or add new tests based on their clinical decision)   Activity: As tolerated with Full fall precautions use walker/cane & assistance as needed   Disposition Home     Diet:   Diet heart healthy/carb modified  .  For Heart failure patients - Check your Weight same time everyday, if you gain over 2 pounds, or you develop in leg swelling, experience more shortness of breath or chest pain, call your Primary MD immediately. Follow Cardiac Low Salt Diet and 1.5 lit/day fluid restriction.   On your next visit with your primary care physician please Get Medicines reviewed and adjusted.   Please request your Prim.MD to go over all Hospital Tests and Procedure/Radiological results at the follow up, please get all Hospital records sent to your Prim MD by signing hospital release before you go home.   If you experience worsening of your admission symptoms, develop shortness of breath, life threatening emergency, suicidal or homicidal thoughts you must seek medical attention immediately by calling 911 or calling your MD immediately  if symptoms less severe.  You Must read complete instructions/literature along with all the possible adverse reactions/side effects for all the Medicines you take and that have been prescribed to you. Take any new Medicines after you have completely understood and accpet all the possible adverse reactions/side effects.   Do not drive, operate heavy machinery, perform activities at heights, swimming or participation in water activities or provide baby sitting services if your were admitted for syncope or siezures until you have seen by  Primary MD or a Neurologist and advised to do so again.  Do not drive when taking Pain medications.    Do not take more than prescribed Pain, Sleep and Anxiety Medications  Special Instructions: If you have smoked or chewed Tobacco  in the last 2 yrs please stop smoking, stop any regular Alcohol  and or any Recreational drug use.  Wear Seat belts while driving.   Please note  You were cared for by a hospitalist during your hospital stay. If you have any questions about your discharge medications or the care you received while you were in the hospital after you are discharged, you can call the unit and asked to speak with the hospitalist on call if the hospitalist that took care of you is not available. Once you are discharged, your primary care physician will handle any further medical issues. Please note that NO REFILLS for any discharge medications will be authorized once you are discharged, as it is imperative that you return to your primary care physician (or establish a relationship with a primary care physician if you do not have one) for your aftercare needs so that they can reassess your need for medications and monitor your lab values.

## 2016-10-11 NOTE — Discharge Summary (Signed)
[name] [id number] DOB: March 12, 1946 DOA: 10/08/2016  PCP: Wende Neighbors, MD  Admit date: 10/08/2016  Discharge date: 10/11/2016  Admitted From: Home  Disposition:  Home   Recommendations for Outpatient Follow-up:   Follow up with PCP in 1-2 weeks  PCP Please obtain BMP/CBC, 2 view CXR in 1week,  (see Discharge instructions)   PCP Please follow up on the following pending results: None   Home Health: None   Equipment/Devices: None  Consultations: None Discharge Condition: Stable   CODE STATUS: Full   Diet Recommendation: Diet heart healthy/carb modified    Chief Complaint  Patient presents with  . Fever     Brief history of present illness from the day of admission and additional interim summary    71 y.o.malewith medical history significant of bladder cancer getting chemo , CAD, PVD comes in with one day of feeling awful with cough, sob, fever, chills, shaking uncontrollably at home.He appears to have gram-negative bacteria due to UTI. No clinical signs of pneumonia.                                                     Hospital issues addressed     1.UTI with gram-negative bacteremia and sepsis. Sepsis physiology has resolved, he appears nontoxic and is currently fever free, And a blood culture suggestive of Proteus infection, will be placed on 8 more days of oral Vantin to complete a total of 10 day course, request PCP to check CBC, BMP next visit along with a UA.   Does have history of bladder cancer and request PCP to make sure he follows with his urologist within week to 10 days as well. No pneumonia clinically.   2. History of gout. On allopurinol continue.  3. Essential hypertension. Currently on Norvasc and beta blocker continue  4. Dyslipidemia. On statin stable.  5. CAD/PAD. No  acute issues continue combination of aspirin, statin and beta blocker.  6. BPH. Continue alpha blocker.   7. OSA. On CPAP daily at bedtime.   8. DM type II. Continue home regimen follow with PCP.   Discharge diagnosis     Principal Problem:   Community acquired pneumonia of left lower lobe of lung ([place]) Active Problems:   Malignant neoplasm of bladder ([place])   Carotid artery disease without cerebral infarction ([facility])   CAD (coronary artery disease)   Sepsis ([place])   DM type 2 (diabetes mellitus, type 2) [facility])    Discharge instructions    Discharge Instructions    Discharge instructions    Complete by:  As directed    Follow with Primary MD Wende Neighbors, MD in 7 days   Get CBC, CMP, 2 view Chest X ray checked  by Primary MD or SNF MD in 5-7 days ( we routinely change or add medications that can affect your baseline labs and fluid status,  therefore we recommend that you get the mentioned basic workup next visit with your PCP, your PCP may decide not to get them or add new tests based on their clinical decision)   Activity: As tolerated with Full fall precautions use walker/cane & assistance as needed   Disposition Home     Diet:   Diet heart healthy/carb modified  .  For Heart failure patients - Check your Weight same time everyday, if you gain over 2 pounds, or you develop in leg swelling, experience more shortness of breath or chest pain, call your Primary MD immediately. Follow Cardiac Low Salt Diet and 1.5 lit/day fluid restriction.   On your next visit with your primary care physician please Get Medicines reviewed and adjusted.   Please request your Prim.MD to go over all Hospital Tests and Procedure/Radiological results at the follow up, please get all Hospital records sent to your Prim MD by signing hospital release before you go home.   If you experience worsening of your admission symptoms, develop shortness of breath, life threatening emergency, suicidal or  homicidal thoughts you must seek medical attention immediately by calling 911 or calling your MD immediately  if symptoms less severe.  You Must read complete instructions/literature along with all the possible adverse reactions/side effects for all the Medicines you take and that have been prescribed to you. Take any new Medicines after you have completely understood and accpet all the possible adverse reactions/side effects.   Do not drive, operate heavy machinery, perform activities at heights, swimming or participation in water activities or provide baby sitting services if your were admitted for syncope or siezures until you have seen by Primary MD or a Neurologist and advised to do so again.  Do not drive when taking Pain medications.    Do not take more than prescribed Pain, Sleep and Anxiety Medications  Special Instructions: If you have smoked or chewed Tobacco  in the last 2 yrs please stop smoking, stop any regular Alcohol  and or any Recreational drug use.  Wear Seat belts while driving.   Please note  You were cared for by a hospitalist during your hospital stay. If you have any questions about your discharge medications or the care you received while you were in the hospital after you are discharged, you can call the unit and asked to speak with the hospitalist on call if the hospitalist that took care of you is not available. Once you are discharged, your primary care physician will handle any further medical issues. Please note that NO REFILLS for any discharge medications will be authorized once you are discharged, as it is imperative that you return to your primary care physician (or establish a relationship with a primary care physician if you do not have one) for your aftercare needs so that they can reassess your need for medications and monitor your lab values.   Increase activity slowly    Complete by:  As directed       Discharge Medications   Allergies as of  10/11/2016      Reactions   Dilaudid [hydromorphone Hcl] Other (See Comments)   "acts a little off"   Toradol [ketorolac Tromethamine] Other (See Comments)   CauseD  hallucination ?-states not sure/ avoids per his MD   Lyrica [pregabalin] Nausea Only, Other (See Comments)   Make me feel "bad"      Medication List    TAKE these medications   acetaminophen 500 MG tablet Commonly known as:  TYLENOL Take 1,000 mg by mouth every 6 (six) hours as needed for moderate pain.   ALEVE 220 MG Caps Generic drug:  Naproxen Sodium Take 1 capsule by mouth daily as needed.   allopurinol 300 MG tablet Commonly known as:  ZYLOPRIM Take 300 mg by mouth at bedtime.   amLODipine 10 MG tablet Commonly known as:  NORVASC TAKE 1 TABLET BY MOUTH EVERY DAY What changed:  See the new instructions.   aspirin 81 MG EC tablet Take 81 mg by mouth at bedtime.   cefpodoxime 200 MG tablet Commonly known as:  VANTIN Take 1 tablet (200 mg total) by mouth 2 (two) times daily.   celecoxib 200 MG capsule Commonly known as:  CELEBREX Take 200 mg by mouth every morning.   clopidogrel 75 MG tablet Commonly known as:  PLAVIX Take 1 tablet by mouth daily.   colchicine 0.6 MG tablet Take 0.6 mg by mouth daily as needed (gout). For gout   docusate sodium 100 MG capsule Commonly known as:  COLACE Take 200 mg by mouth at bedtime.   dutasteride 0.5 MG capsule Commonly known as:  AVODART Take 0.5 mg by mouth every morning.   finasteride 5 MG tablet Commonly known as:  PROSCAR Take 5 mg by mouth at bedtime.   furosemide 40 MG tablet Commonly known as:  LASIX Take 40 mg by mouth every morning.   gabapentin 300 MG capsule Commonly known as:  NEURONTIN Take 300 mg by mouth 3 (three) times daily.   ibuprofen 200 MG tablet Commonly known as:  ADVIL,MOTRIN Take 400-600 mg by mouth every 8 (eight) hours as needed for moderate pain. Reported on 01/20/2016   insulin detemir 100 UNIT/ML injection Commonly  known as:  LEVEMIR Inject 25 Units into the skin at bedtime.   labetalol 100 MG tablet Commonly known as:  NORMODYNE Take 300 mg by mouth 2 (two) times daily.   losartan 100 MG tablet Commonly known as:  COZAAR TAKE 1 TABLET BY MOUTH DAILY   omega-3 acid ethyl esters 1 g capsule Commonly known as:  LOVAZA Take 1 g by mouth 2 (two) times daily.   polyethylene glycol powder powder Commonly known as:  GLYCOLAX/MIRALAX TAKE 17MG (1CAPFUL) BY MOUTH(MIXED WITH WATER/JUICE) DAILY What changed:  See the new instructions.   rosuvastatin 10 MG tablet Commonly known as:  CRESTOR Take 10 mg by mouth every morning.   tamsulosin 0.4 MG Caps capsule Commonly known as:  FLOMAX Take 0.4 mg by mouth at bedtime. Reported on 01/20/2016       Follow-up Information    Wende Neighbors, MD. Schedule an appointment as soon as possible for a visit in 1 week(s).   Specialty:  Internal Medicine Contact information: [address] [place] [address] [phone]           Major procedures and Radiology Reports - PLEASE review detailed and final reports thoroughly  -         Dg Chest 2 View  Result Date: 10/09/2016 CLINICAL DATA:  Acute onset of generalized body pain and fever. Uncontrollable shaking. Initial encounter. EXAM: CHEST  2 VIEW COMPARISON:  Chest radiograph performed 02/04/2014 FINDINGS: The lungs are well-aerated. Minimal left basilar opacity raises concern for infection, depending on the patient's symptoms. There is no evidence of pleural effusion or pneumothorax. The heart is normal in size; the mediastinal contour is within normal limits. No acute osseous abnormalities are seen. Cervical spinal fusion hardware is partially imaged. IMPRESSION: Minimal left basilar  airspace opacity raises concern for infection, depending on the patient's symptoms. Electronically Signed   By: Garald Balding M.D.   On: 10/09/2016 00:08   Dg Chest Port 1 View  Result Date: 10/10/2016 CLINICAL DATA:   Shortness of breath. EXAM: PORTABLE CHEST 1 VIEW COMPARISON:  10/08/2016 FINDINGS: The cardiac silhouette, mediastinal and hilar contours are within normal limits and stable. Mild tortuosity and calcification of the thoracic aorta. No acute pulmonary findings. No pleural effusion. The bony thorax is intact. IMPRESSION: No acute cardiopulmonary findings. Electronically Signed   By: Marijo Sanes M.D.   On: 10/10/2016 08:21    Micro Results     Recent Results (from the past 240 hour(s))  Blood Culture (routine x 2)     Status: None (Preliminary result)   Collection Time: 10/08/16 10:51 PM  Result Value Ref Range Status   Specimen Description BLOOD RIGHT ANTECUBITAL  Final   Special Requests BOTTLES DRAWN AEROBIC AND ANAEROBIC 6CC EACH  Final   Culture NO GROWTH 2 DAYS  Final   Report Status PENDING  Incomplete  Blood Culture (routine x 2)     Status: Abnormal (Preliminary result)   Collection Time: 10/08/16 11:05 PM  Result Value Ref Range Status   Specimen Description BLOOD RIGHT FOREARM  Final   Special Requests BOTTLES DRAWN AEROBIC AND ANAEROBIC 8CC EACH  Final   Culture  Setup Time   Final    GRAM NEGATIVE RODS ANAEROBIC BOTTLE ONLY Gram Stain Report Called to,Read Back By and Verified With: FREEMAN L. AT [id number] ON [id number] BY THOMPSON S. CRITICAL RESULT CALLED TO, READ BACK BY AND VERIFIED WITH: H TETREAULT RN 2052 10/09/16 A BROWNING GRAM NEGATIVE RODS AEB BOTTLE AT [facility] CRITICAL VALUE NOTED.  VALUE IS CONSISTENT WITH PREVIOUSLY REPORTED AND CALLED VALUE.    Culture (A)  Final    PROTEUS MIRABILIS SUSCEPTIBILITIES TO FOLLOW Performed at [facility], [place] [address] [place], [place] [address]    Report Status PENDING  Incomplete  Blood Culture ID Panel (Reflexed)     Status: Abnormal   Collection Time: 10/08/16 11:05 PM  Result Value Ref Range Status   Enterococcus species NOT DETECTED NOT DETECTED Final   Listeria monocytogenes NOT DETECTED NOT DETECTED Final    Staphylococcus species NOT DETECTED NOT DETECTED Final   Staphylococcus aureus NOT DETECTED NOT DETECTED Final   Streptococcus species NOT DETECTED NOT DETECTED Final   Streptococcus agalactiae NOT DETECTED NOT DETECTED Final   Streptococcus pneumoniae NOT DETECTED NOT DETECTED Final   Streptococcus pyogenes NOT DETECTED NOT DETECTED Final   Acinetobacter baumannii NOT DETECTED NOT DETECTED Final   Enterobacteriaceae species DETECTED (A) NOT DETECTED Final    Comment: CRITICAL RESULT CALLED TO, READ BACK BY AND VERIFIED WITH: H TETREAULT RN 2052 10/09/16 A BROWNING    Enterobacter cloacae complex NOT DETECTED NOT DETECTED Final   Escherichia coli NOT DETECTED NOT DETECTED Final   Klebsiella oxytoca NOT DETECTED NOT DETECTED Final   Klebsiella pneumoniae NOT DETECTED NOT DETECTED Final   Proteus species DETECTED (A) NOT DETECTED Final    Comment: CRITICAL RESULT CALLED TO, READ BACK BY AND VERIFIED WITH: H TETREAULT RN 2052 10/09/16 A BROWNING    Serratia marcescens NOT DETECTED NOT DETECTED Final   Carbapenem resistance NOT DETECTED NOT DETECTED Final   Haemophilus influenzae NOT DETECTED NOT DETECTED Final   Neisseria meningitidis NOT DETECTED NOT DETECTED Final   Pseudomonas aeruginosa NOT DETECTED NOT DETECTED Final   Candida albicans NOT  DETECTED NOT DETECTED Final   Candida glabrata NOT DETECTED NOT DETECTED Final   Candida krusei NOT DETECTED NOT DETECTED Final   Candida parapsilosis NOT DETECTED NOT DETECTED Final   Candida tropicalis NOT DETECTED NOT DETECTED Final    Comment: Performed at [facility], [place] [address] [place], [place] [address]  Urine culture     Status: Abnormal (Preliminary result)   Collection Time: 10/08/16 11:22 PM  Result Value Ref Range Status   Specimen Description URINE, RANDOM  Final   Special Requests NONE  Final   Culture 20,000 COLONIES/mL GRAM NEGATIVE RODS (A)  Final   Report Status PENDING  Incomplete  MRSA PCR Screening     Status:  None   Collection Time: 10/09/16  2:20 AM  Result Value Ref Range Status   MRSA by PCR NEGATIVE NEGATIVE Final    Comment:        The GeneXpert MRSA Assay (FDA approved for NASAL specimens only), is one component of a comprehensive MRSA colonization surveillance program. It is not intended to diagnose MRSA infection nor to guide or monitor treatment for MRSA infections.     Today   Subjective    [name] today has no headache,no chest abdominal pain,no new weakness tingling or numbness, feels much better wants to go home today.    Objective   Blood pressure (!) 148/69, pulse 71, temperature 97.5 F (36.4 C), temperature source Oral, resp. rate 20, height 5\' 8"  (1.727 m), weight 97.7 kg (215 lb 6.2 oz), SpO2 99 %.   Intake/Output Summary (Last 24 hours) at 10/11/16 0937 Last data filed at 10/11/16 0918  Gross per 24 hour  Intake              960 ml  Output             1450 ml  Net             -490 ml    Exam Awake Alert, Oriented x 3, No new F.N deficits, Normal affect [place].AT,PERRAL Supple Neck,No JVD, No cervical lymphadenopathy appriciated.  Symmetrical Chest wall movement, Good air movement bilaterally, CTAB RRR,No Gallops,Rubs or new Murmurs, No Parasternal Heave +ve B.Sounds, Abd Soft, Non tender, No organomegaly appriciated, No rebound -guarding or rigidity. No Cyanosis, Clubbing or edema, No new Rash or bruise   Data Review   CBC w Diff: Lab Results  Component Value Date   WBC 8.8 10/09/2016   HGB 13.3 10/09/2016   HCT 38.6 (L) 10/09/2016   PLT 129 (L) 10/09/2016   LYMPHOPCT 12 10/09/2016   MONOPCT 6 10/09/2016   EOSPCT 2 10/09/2016   BASOPCT 0 10/09/2016    CMP: Lab Results  Component Value Date   NA 138 10/09/2016   K 3.5 10/09/2016   CL 104 10/09/2016   CO2 26 10/09/2016   BUN 15 10/09/2016   CREATININE 1.01 10/09/2016   PROT 7.8 10/08/2016   ALBUMIN 4.5 10/08/2016   BILITOT 0.9 10/08/2016   ALKPHOS 56 10/08/2016   AST 19 10/08/2016    ALT 21 10/08/2016  .   Total Time in preparing paper work, data evaluation and todays exam - 35 minutes  Thurnell Lose M.D on 10/11/2016 at 9:37 AM  [facility]   Office  [phone]

## 2016-10-11 NOTE — Progress Notes (Signed)
Patient discharged home with prescriptions and personal belongings. Patient sent home with discharge instructions and IV removed and site intact.

## 2016-10-12 LAB — URINE CULTURE: Culture: 20000 — AB

## 2016-10-13 LAB — CULTURE, BLOOD (ROUTINE X 2): Culture: NO GROWTH

## 2016-10-16 DIAGNOSIS — Z5111 Encounter for antineoplastic chemotherapy: Secondary | ICD-10-CM | POA: Diagnosis not present

## 2016-10-16 DIAGNOSIS — C670 Malignant neoplasm of trigone of bladder: Secondary | ICD-10-CM | POA: Diagnosis not present

## 2016-10-18 DIAGNOSIS — E782 Mixed hyperlipidemia: Secondary | ICD-10-CM | POA: Diagnosis not present

## 2016-10-18 DIAGNOSIS — I10 Essential (primary) hypertension: Secondary | ICD-10-CM | POA: Diagnosis not present

## 2016-10-22 DIAGNOSIS — M069 Rheumatoid arthritis, unspecified: Secondary | ICD-10-CM | POA: Diagnosis not present

## 2016-10-22 DIAGNOSIS — E1140 Type 2 diabetes mellitus with diabetic neuropathy, unspecified: Secondary | ICD-10-CM | POA: Diagnosis not present

## 2016-10-22 DIAGNOSIS — Z720 Tobacco use: Secondary | ICD-10-CM | POA: Diagnosis not present

## 2016-10-22 DIAGNOSIS — E782 Mixed hyperlipidemia: Secondary | ICD-10-CM | POA: Diagnosis not present

## 2016-10-22 DIAGNOSIS — M109 Gout, unspecified: Secondary | ICD-10-CM | POA: Diagnosis not present

## 2016-10-22 DIAGNOSIS — I10 Essential (primary) hypertension: Secondary | ICD-10-CM | POA: Diagnosis not present

## 2016-10-23 DIAGNOSIS — Z5111 Encounter for antineoplastic chemotherapy: Secondary | ICD-10-CM | POA: Diagnosis not present

## 2016-10-23 DIAGNOSIS — C670 Malignant neoplasm of trigone of bladder: Secondary | ICD-10-CM | POA: Diagnosis not present

## 2016-10-30 DIAGNOSIS — Z5111 Encounter for antineoplastic chemotherapy: Secondary | ICD-10-CM | POA: Diagnosis not present

## 2016-10-30 DIAGNOSIS — C670 Malignant neoplasm of trigone of bladder: Secondary | ICD-10-CM | POA: Diagnosis not present

## 2016-10-31 ENCOUNTER — Other Ambulatory Visit: Payer: Self-pay | Admitting: Cardiovascular Disease

## 2016-11-06 DIAGNOSIS — Z5111 Encounter for antineoplastic chemotherapy: Secondary | ICD-10-CM | POA: Diagnosis not present

## 2016-11-06 DIAGNOSIS — C670 Malignant neoplasm of trigone of bladder: Secondary | ICD-10-CM | POA: Diagnosis not present

## 2016-11-13 DIAGNOSIS — Z5111 Encounter for antineoplastic chemotherapy: Secondary | ICD-10-CM | POA: Diagnosis not present

## 2016-11-13 DIAGNOSIS — C670 Malignant neoplasm of trigone of bladder: Secondary | ICD-10-CM | POA: Diagnosis not present

## 2017-01-01 DIAGNOSIS — C61 Malignant neoplasm of prostate: Secondary | ICD-10-CM | POA: Diagnosis not present

## 2017-01-01 DIAGNOSIS — C670 Malignant neoplasm of trigone of bladder: Secondary | ICD-10-CM | POA: Diagnosis not present

## 2017-01-01 DIAGNOSIS — R3915 Urgency of urination: Secondary | ICD-10-CM | POA: Diagnosis not present

## 2017-01-01 DIAGNOSIS — N401 Enlarged prostate with lower urinary tract symptoms: Secondary | ICD-10-CM | POA: Diagnosis not present

## 2017-01-28 ENCOUNTER — Ambulatory Visit (HOSPITAL_COMMUNITY): Admission: RE | Admit: 2017-01-28 | Payer: Medicare HMO | Source: Ambulatory Visit

## 2017-01-31 ENCOUNTER — Other Ambulatory Visit: Payer: Self-pay | Admitting: Cardiovascular Disease

## 2017-01-31 NOTE — Telephone Encounter (Signed)
Refill Request.  

## 2017-02-03 ENCOUNTER — Encounter (HOSPITAL_COMMUNITY): Payer: Self-pay

## 2017-02-03 ENCOUNTER — Emergency Department (HOSPITAL_COMMUNITY): Payer: Medicare HMO

## 2017-02-03 ENCOUNTER — Emergency Department (HOSPITAL_COMMUNITY)
Admission: EM | Admit: 2017-02-03 | Discharge: 2017-02-03 | Disposition: A | Discharge status: Home / Self Care | Payer: Medicare HMO | Attending: Emergency Medicine | Admitting: Emergency Medicine

## 2017-02-03 DIAGNOSIS — Z7982 Long term (current) use of aspirin: Secondary | ICD-10-CM | POA: Diagnosis not present

## 2017-02-03 DIAGNOSIS — E119 Type 2 diabetes mellitus without complications: Secondary | ICD-10-CM | POA: Insufficient documentation

## 2017-02-03 DIAGNOSIS — I10 Essential (primary) hypertension: Secondary | ICD-10-CM | POA: Diagnosis not present

## 2017-02-03 DIAGNOSIS — Z79899 Other long term (current) drug therapy: Secondary | ICD-10-CM | POA: Diagnosis not present

## 2017-02-03 DIAGNOSIS — I2510 Atherosclerotic heart disease of native coronary artery without angina pectoris: Secondary | ICD-10-CM | POA: Diagnosis not present

## 2017-02-03 DIAGNOSIS — K0889 Other specified disorders of teeth and supporting structures: Secondary | ICD-10-CM | POA: Diagnosis present

## 2017-02-03 DIAGNOSIS — Z8673 Personal history of transient ischemic attack (TIA), and cerebral infarction without residual deficits: Secondary | ICD-10-CM | POA: Insufficient documentation

## 2017-02-03 DIAGNOSIS — Z8546 Personal history of malignant neoplasm of prostate: Secondary | ICD-10-CM | POA: Diagnosis not present

## 2017-02-03 DIAGNOSIS — G501 Atypical facial pain: Secondary | ICD-10-CM | POA: Diagnosis not present

## 2017-02-03 DIAGNOSIS — R220 Localized swelling, mass and lump, head: Secondary | ICD-10-CM | POA: Diagnosis not present

## 2017-02-03 DIAGNOSIS — Z87891 Personal history of nicotine dependence: Secondary | ICD-10-CM | POA: Diagnosis not present

## 2017-02-03 DIAGNOSIS — K047 Periapical abscess without sinus: Secondary | ICD-10-CM | POA: Diagnosis not present

## 2017-02-03 DIAGNOSIS — R51 Headache: Secondary | ICD-10-CM | POA: Insufficient documentation

## 2017-02-03 DIAGNOSIS — R519 Headache, unspecified: Secondary | ICD-10-CM

## 2017-02-03 DIAGNOSIS — R509 Fever, unspecified: Secondary | ICD-10-CM | POA: Diagnosis not present

## 2017-02-03 LAB — URINALYSIS, ROUTINE W REFLEX MICROSCOPIC
BACTERIA UA: NONE SEEN
BILIRUBIN URINE: NEGATIVE
Glucose, UA: NEGATIVE mg/dL
KETONES UR: NEGATIVE mg/dL
Leukocytes, UA: NEGATIVE
Nitrite: NEGATIVE
PROTEIN: NEGATIVE mg/dL
SQUAMOUS EPITHELIAL / LPF: NONE SEEN
Specific Gravity, Urine: 1.012 (ref 1.005–1.030)
pH: 6 (ref 5.0–8.0)

## 2017-02-03 LAB — LACTIC ACID, PLASMA
Lactic Acid, Venous: 1.5 mmol/L (ref 0.5–1.9)
Lactic Acid, Venous: 1.1 mmol/L (ref 0.5–1.9)

## 2017-02-03 LAB — COMPREHENSIVE METABOLIC PANEL
ALBUMIN: 4.4 g/dL (ref 3.5–5.0)
ALT: 20 U/L (ref 17–63)
AST: 20 U/L (ref 15–41)
Alkaline Phosphatase: 62 U/L (ref 38–126)
Anion gap: 8 (ref 5–15)
BUN: 14 mg/dL (ref 6–20)
CHLORIDE: 104 mmol/L (ref 101–111)
CO2: 26 mmol/L (ref 22–32)
CREATININE: 0.98 mg/dL (ref 0.61–1.24)
Calcium: 9.4 mg/dL (ref 8.9–10.3)
GFR calc Af Amer: >60 mL/min (ref >60)
GFR calc non Af Amer: >60 mL/min (ref >60)
Glucose, Bld: 166 mg/dL — ABNORMAL HIGH (ref 65–99)
POTASSIUM: 4 mmol/L (ref 3.5–5.1)
SODIUM: 138 mmol/L (ref 135–145)
Total Bilirubin: 0.7 mg/dL (ref 0.3–1.2)
Total Protein: 7.8 g/dL (ref 6.5–8.1)

## 2017-02-03 LAB — CBC WITH DIFFERENTIAL/PLATELET
BASOS ABS: 0.1 10*3/uL (ref 0.0–0.1)
Basophils Relative: 1 %
Eosinophils Absolute: 0.1 10*3/uL (ref 0.0–0.7)
Eosinophils Relative: 1 %
HEMATOCRIT: 40.7 % (ref 39.0–52.0)
HEMOGLOBIN: 14 g/dL (ref 13.0–17.0)
LYMPHS PCT: 10 %
Lymphs Abs: 0.9 10*3/uL (ref 0.7–4.0)
MCH: 31.6 pg (ref 26.0–34.0)
MCHC: 34.4 g/dL (ref 30.0–36.0)
MCV: 91.9 fL (ref 78.0–100.0)
MONO ABS: 0.9 10*3/uL (ref 0.1–1.0)
MONOS PCT: 10 %
NEUTROS ABS: 7.3 10*3/uL (ref 1.7–7.7)
NEUTROS PCT: 78 %
Platelets: 153 10*3/uL (ref 150–400)
RBC: 4.43 MIL/uL (ref 4.22–5.81)
RDW: 13.5 % (ref 11.5–15.5)
WBC: 9.3 10*3/uL (ref 4.0–10.5)

## 2017-02-03 MED ORDER — HYDROCODONE-ACETAMINOPHEN 5-325 MG PO TABS
1.0000 | ORAL_TABLET | Freq: Once | ORAL | Status: AC
  Administered 2017-02-03: 1 via ORAL
  Filled 2017-02-03: qty 1

## 2017-02-03 MED ORDER — ONDANSETRON HCL 4 MG PO TABS
4.0000 mg | ORAL_TABLET | Freq: Once | ORAL | Status: AC
  Administered 2017-02-03: 4 mg via ORAL
  Filled 2017-02-03: qty 1

## 2017-02-03 MED ORDER — CLINDAMYCIN HCL 300 MG PO CAPS: 300.0000 mg | ORAL_CAPSULE | Freq: Three times a day (TID) | ORAL | 0 refills | Status: DC

## 2017-02-03 MED ORDER — CLINDAMYCIN HCL 150 MG PO CAPS
300.0000 mg | ORAL_CAPSULE | Freq: Once | ORAL | Status: AC
  Administered 2017-02-03: 300 mg via ORAL
  Filled 2017-02-03: qty 2

## 2017-02-03 MED ORDER — IOPAMIDOL (ISOVUE-300) INJECTION 61%
75.0000 mL | Freq: Once | INTRAVENOUS | Status: AC | PRN
  Administered 2017-02-03: 75 mL via INTRAVENOUS

## 2017-02-03 MED ORDER — IBUPROFEN 400 MG PO TABS
400.0000 mg | ORAL_TABLET | Freq: Once | ORAL | Status: AC
  Administered 2017-02-03: 400 mg via ORAL
  Filled 2017-02-03: qty 1

## 2017-02-03 MED ORDER — HYDROCODONE-ACETAMINOPHEN 5-325 MG PO TABS: 1.0000 | ORAL_TABLET | ORAL | 0 refills | Status: DC | PRN

## 2017-02-03 NOTE — Discharge Instructions (Signed)
Your CT scan reveals a small abscess at the root of a left molar. You have a chronic sinus condition. Please use clindamycin three times daily with food. Use tylenol or ibuprofen for mild pain.  Use norco for more severe pain.I have reviewed nursing notes, vital signs, and all appropriate lab and imaging results for this patient. Please see Dr Earnest Conroy. Hall for follow up of your pain if not seen by the dentist soon.

## 2017-02-03 NOTE — ED Triage Notes (Signed)
Pt complaining of dental pain. Had a procedure Thursday of last week done to remove a different tooth but now pt.'s left side of his mouth is sore. Left side of face is swollen and pt has trembling in arms. Says this happens whenever he has an infection

## 2017-02-03 NOTE — ED Notes (Signed)
Pt. Reports taking 2 Tylenol around 8 am and has taken his morning home medications before arrival to hospital.

## 2017-02-03 NOTE — ED Notes (Signed)
Pt. Back from CT.

## 2017-02-03 NOTE — ED Provider Notes (Signed)
Medical screening examination/treatment/procedure(s) were conducted as a shared visit with non-physician practitioner(s) and myself.  I personally evaluated the patient during the encounter.   EKG Interpretation  Date/Time:  Sunday Feb 03 2017 09:06:08 EDT Ventricular Rate:  76 PR Interval:    QRS Duration: 96 QT Interval:  384 QTC Calculation: 432 R Axis:   9 Text Interpretation:  Sinus rhythm Baseline wander in lead(s) V1 V4 Confirmed by Fredia Sorrow [phone]) on 02/03/2017 9:19:29 AM       Results for orders placed or performed during the hospital encounter of 02/03/17  Comprehensive metabolic panel  Result Value Ref Range   Sodium 138 135 - 145 mmol/L   Potassium 4.0 3.5 - 5.1 mmol/L   Chloride 104 101 - 111 mmol/L   CO2 26 22 - 32 mmol/L   Glucose, Bld 166 (H) 65 - 99 mg/dL   BUN 14 6 - 20 mg/dL   Creatinine, Ser 0.98 0.61 - 1.24 mg/dL   Calcium 9.4 8.9 - 10.3 mg/dL   Total Protein 7.8 6.5 - 8.1 g/dL   Albumin 4.4 3.5 - 5.0 g/dL   AST 20 15 - 41 U/L   ALT 20 17 - 63 U/L   Alkaline Phosphatase 62 38 - 126 U/L   Total Bilirubin 0.7 0.3 - 1.2 mg/dL   GFR calc non Af Amer >60 >60 mL/min   GFR calc Af Amer >60 >60 mL/min   Anion gap 8 5 - 15  Lactic acid, plasma  Result Value Ref Range   Lactic Acid, Venous 1.5 0.5 - 1.9 mmol/L  Lactic acid, plasma  Result Value Ref Range   Lactic Acid, Venous 1.1 0.5 - 1.9 mmol/L  CBC with Differential  Result Value Ref Range   WBC 9.3 4.0 - 10.5 K/uL   RBC 4.43 4.22 - 5.81 MIL/uL   Hemoglobin 14.0 13.0 - 17.0 g/dL   HCT 40.7 39.0 - 52.0 %   MCV 91.9 78.0 - 100.0 fL   MCH 31.6 26.0 - 34.0 pg   MCHC 34.4 30.0 - 36.0 g/dL   RDW 13.5 11.5 - 15.5 %   Platelets 153 150 - 400 K/uL   Neutrophils Relative % 78 %   Neutro Abs 7.3 1.7 - 7.7 K/uL   Lymphocytes Relative 10 %   Lymphs Abs 0.9 0.7 - 4.0 K/uL   Monocytes Relative 10 %   Monocytes Absolute 0.9 0.1 - 1.0 K/uL   Eosinophils Relative 1 %   Eosinophils Absolute 0.1 0.0 - 0.7  K/uL   Basophils Relative 1 %   Basophils Absolute 0.1 0.0 - 0.1 K/uL  Urinalysis, Routine w reflex microscopic  Result Value Ref Range   Color, Urine YELLOW YELLOW   APPearance CLEAR CLEAR   Specific Gravity, Urine 1.012 1.005 - 1.030   pH 6.0 5.0 - 8.0   Glucose, UA NEGATIVE NEGATIVE mg/dL   Hgb urine dipstick SMALL (A) NEGATIVE   Bilirubin Urine NEGATIVE NEGATIVE   Ketones, ur NEGATIVE NEGATIVE mg/dL   Protein, ur NEGATIVE NEGATIVE mg/dL   Nitrite NEGATIVE NEGATIVE   Leukocytes, UA NEGATIVE NEGATIVE   RBC / HPF 0-5 0 - 5 RBC/hpf   WBC, UA 0-5 0 - 5 WBC/hpf   Bacteria, UA NONE SEEN NONE SEEN   Squamous Epithelial / LPF NONE SEEN NONE SEEN   Mucous PRESENT    Hyaline Casts, UA PRESENT    Dg Chest 2 View  Result Date: 02/03/2017 CLINICAL DATA:  Chills and fever beginning yesterday. EXAM:  CHEST  2 VIEW COMPARISON:  10/10/2016 FINDINGS: Cardiac silhouette is normal in size. Normal mediastinal and hilar contours. Clear lungs.  No pleural effusion or pneumothorax. Status post anterior cervical spine fusion and posterior lumbar spine fusion. Skeletal structures are demineralized. IMPRESSION: No acute cardiopulmonary disease. Electronically Signed   By: Lajean Manes M.D.   On: 02/03/2017 11:30   Ct Maxillofacial W Contrast  Result Date: 02/03/2017 CLINICAL DATA:  Soreness at the left maxillary teeth. Left facial swelling. Dental pain. EXAM: CT MAXILLOFACIAL WITH CONTRAST TECHNIQUE: Multidetector CT imaging of the maxillofacial structures was performed. Multiplanar CT image reconstructions were also generated. A small metallic BB was placed on the right temple in order to reliably differentiate right from left. CONTRAST:  24mL ISOVUE-300 IOPAMIDOL (ISOVUE-300) INJECTION 61% COMPARISON:  None. FINDINGS: Osseous: There is minimal lucency about the posterior roots of the residual left maxillary molar. A metallic crown is in place. No dental caries is evident. A large dental caries is present in  the median right maxillary incisor with extensive air lucency. No other acute osseous abnormalities present. Facial bones are intact. Nasal bones are normal. The mandible is intact and located. Orbits: Bilateral lens replacements are present. The globes and orbits are within normal limits. The cavernous sinus is normal. The optic chiasm is within normal limits. Sinuses: Bilateral maxillary antrostomies noted. Left greater than right ethmoidectomies are present. Extensive residual maxillary sinus disease is present, left greater than right. Chronic paranasal sinus wall thickening is again seen. No fluid levels are present. Mucosal disease extends into the right frontal sinus. The sphenoid sinuses are clear. The mastoid air cells are clear. Soft tissues: No significant facial soft tissue swelling is present. No significant adenopathy is noted. Limited intracranial: The visualized intracranial contents are normal. IMPRESSION: 1. Minimal periapical lucency about the posterior roots of the residual left maxillary molar without associated soft tissue swelling or abscess. 2. Prominent dental caries involving the right paramedian maxillary incisor. Associated periapical lucency. 3. Residual diffuse bilateral maxillary ethmoid sinus disease despite prior maxillary antrostomy and ethmoidectomies. Electronically Signed   By: San Morelle M.D.   On: 02/03/2017 13:00     Patient seen by me along with physician assistant. Patient with a lot of dental caries. Being followed by Simona Huh in the [place] area. Was seen there Thursday of last week and had some teeth removed. Now with complaint of left-sided face swelling. And has some trembling. Patient states that when he has a trembling usually has a infection.  Today's workup raises some concerns perhaps for maxillary sinus disease or infection certainly has evidence of a lot of old sinus disease. No evidence of any significant maxillofacial facial abscess. But certainly  could have some tooth abscess.  Lactic acid not elevated. No significant leukocytosis. Not febrile patient nontoxic no acute distress.  Recommend close follow-up with [facility]ona Huh will start on antibiotics for teeth and sinus. And treat the pain.  Patient on exam oral cavity other than multiple caries no significant swelling. No tongue swelling. Uvula midline. Some swelling to the left cheek area of the face.  Patient does state he has pain on both sides.   Fredia Sorrow, MD 02/03/17 1346

## 2017-02-03 NOTE — ED Provider Notes (Signed)
[facility] DEPT Provider Note   CSN: [id number] Arrival date & time: 02/03/17  0854     History   Chief Complaint Chief Complaint  Patient presents with  . Dental Pain    HPI [name] is a 71 y.o. male.  Patient is a 71 year old male who presents to the emergency department with complaint of pain and tremor.  The patient and the patient wife states that Thursday, May 10 the patient had dental work done. They state that he has a lot of dental caries and dental problems. In the last 2 days he's been having left-sided facial pain. His mouth is sore. Today the left side of his face was swollen, and he began to have a tremor in his upper extremities. His wife states that when he has an infection that tremors is one of the effects that he has. They have not actually measured a temperature elevation, but have noticed that he has felt warm last night and today. They've been giving him Tylenol for these particular episodes. The patient states that the pain is getting worse. He complains of generally not feeling well and having some body aching. The wife states they attempted to see a dentist, but none of the dentist would do anything for his particular situation. He presents now for assistance with these issues.      Past Medical History:  Diagnosis Date  . Bilateral renal cysts   . Bladder cancer ([place]) UROLOGIST-  DR [facility]   RECURRENT 11/ 2017 --  hx turbt in 2004 and 08-19-2013  . BPH without obstruction/lower urinary tract symptoms   . Cervical fusion syndrome    LEFT ARM NUMBNESS--  CONTROLLED W/ GABAPENTIN  . Coronary artery disease    CARDIOLOGIST-- DR BRANCH [name])  . DDD (degenerative disc disease), cervical   . DDD (degenerative disc disease), lumbar   . Diverticulosis of sigmoid colon   . History of acute gouty arthritis   . History of adenomatous polyp of colon    2001;  2012- non-malignant leiomyoma;  04-07-2014  tubualr adenoma and hyperplastic  polyp's  . History of prostate cancer UROLOGIST-  DR [facility]   S/P  RADIOACTIVE SEED IMPLANTS 2004  . HOH (hard of hearing)    no hearing aids  . Hyperlipidemia   . Hypertension   . OA (osteoarthritis)   . Occlusion and stenosis of carotid artery without mention of cerebral infarction CARDIOLOGIST  -- DR  [name]   LAST DUPLEX  11-23-2015  RICA  (DUPLEX DOPPLER 01-20-2016 RICA 62-70% PROXIMAL) /   LICA <35% (HAD CONSULT W/ [name] , VASCULAR SURGEON 01-20-2016)  . OSA on CPAP    BiPAP  . Spondylosis, cervical   . Type 2 diabetes mellitus ([place])   . Wears glasses     Patient Active Problem List   Diagnosis Date Noted  . Fever 10/09/2016  . Community acquired pneumonia of left lower lobe of lung ([place]) 10/09/2016  . Gram negative sepsis ([place]) 02/05/2014  . UTI (urinary tract infection) 02/04/2014  . Sepsis ([place]) 02/04/2014  . DM type 2 (diabetes mellitus, type 2) ([place]) 02/04/2014  . Tobacco abuse 02/04/2014  . Preop cardiovascular exam 08/05/2013  . CAD (coronary artery disease) 08/05/2012  . Degenerative lumbar spinal stenosis 04/03/2012  . Chest pain 04/03/2012  . Anemia 04/03/2012  . Smoking 06/13/2011  . JOINT EFFUSION, KNEE 01/11/2010  . ARTHRITIS, RIGHT KNEE 12/12/2009  . PAIN IN JOINT, LOWER LEG 12/12/2009  . PROSTATE CANCER  11/08/2009  . GOUTY ARTHRITIS, CHRONIC 11/08/2009  . OVERWEIGHT/OBESITY 11/08/2009  . HYPERTENSION, BENIGN 11/08/2009  . Carotid artery disease without cerebral infarction ([place]) 11/08/2009  . Malignant neoplasm of bladder ([place]) 03/22/2008  . Hyperlipidemia 03/22/2008  . PAD (peripheral artery disease) ([place]) 03/22/2008  . CONSTIPATION, CHRONIC 03/22/2008  . DEGENERATIVE JOINT DISEASE 03/22/2008  . OSA (obstructive sleep apnea) 03/22/2008  . PROSTATE CANCER, HX OF 03/22/2008  . DIVERTICULITIS, HX OF 03/22/2008    Past Surgical History:  Procedure Laterality Date  . ANTERIOR REMOVAL CAGE AND PLATE C3-C7/ CORPECTOMY C7 (FX)/  ALLOGRAFT AND  FUSION C3 -- T1/  POSTERIOR DECOMPRESSION BILATERAL LAMINECTOMY C4 -- 6 & PARTIAL C3/  POSTEROLATERAL ARTHRODESIS C3 - T1  06/05/2005  . APPENDECTOMY  1990'S  . CATARACT EXTRACTION W/ INTRAOCULAR LENS  IMPLANT, BILATERAL  2013  . CERVICAL FUSION  05/11/2005   C3  --  C7/  due to post op quadriparesis same day s/p  anterior C 4,5,6, colpectomy, decompression, removal epidural hematoma, foraminnotomy, cage and plate  . COLONOSCOPY  last one 04-07-2014  . CYSTOSCOPY W/ RETROGRADES Bilateral 08/19/2013   Procedure: CYSTOSCOPY WITH RETROGRADE PYELOGRAM;  Surgeon: Molli Hazard, MD;  Location: [facility];  Service: Urology;  Laterality: Bilateral;  . CYSTOSCOPY W/ RETROGRADES Bilateral 07/18/2016   Procedure: CYSTOSCOPY WITH RETROGRADE PYELOGRAM;  Surgeon: Alexis Frock, MD;  Location: [facility];  Service: Urology;  Laterality: Bilateral;  . ENDOSCOPIC REPAIR CSF LEAK VIA NASAL PASSAGE  2011  . INTRAOPERATIVE ARTERIOGRAM  CATH LAB  01-05-2009  DR Einar Gip   RICA   ACUTE ANGLE 80-85% STENOSIS  . LUMBAR FUSION  03-26-2012   L3 --  L5  . RADIOACTIVE PROSTATE SEED IMPLANTS  08-19-2003  . SEPTOPLASTY  1998  . TRANSURETHRAL RESECTION OF BLADDER TUMOR N/A 08/19/2013   Procedure: TRANSURETHRAL RESECTION OF BLADDER TUMOR (TURBT);  Surgeon: Molli Hazard, MD;  Location[facility];  Service: Urology;  Laterality: N/A;  . TRANSURETHRAL RESECTION OF BLADDER TUMOR N/A 07/18/2016   Procedure: TRANSURETHRAL RESECTION OF BLADDER TUMOR (TURBT);  Surgeon: Alexis Frock, MD;  Location: [name];  Service: Urology;  Laterality: N/A;  . YAG LASER APPLICATION  71/69/6789   Procedure: YAG LASER APPLICATION;  Surgeon: Elta Guadeloupe T. Gershon Crane, MD;  Location: [facility];  Service: Ophthalmology;  Laterality: Right;       Home Medications    Prior to Admission medications   Medication Sig Start Date End Date Taking? Authorizing Provider  acetaminophen  (TYLENOL) 500 MG tablet Take 1,000 mg by mouth every 6 (six) hours as needed for moderate pain.    [provider]  allopurinol (ZYLOPRIM) 300 MG tablet Take 300 mg by mouth at bedtime.     [provider]  amLODipine (NORVASC) 10 MG tablet TAKE 1 TABLET BY MOUTH EVERY DAY 01/31/17   Arnoldo Lenis, MD  aspirin 81 MG EC tablet Take 81 mg by mouth at bedtime.     [provider]  cefpodoxime (VANTIN) 200 MG tablet Take 1 tablet (200 mg total) by mouth 2 (two) times daily. 10/11/16   Thurnell Lose, MD  celecoxib (CELEBREX) 200 MG capsule Take 200 mg by mouth every morning.    [provider]  clopidogrel (PLAVIX) 75 MG tablet Take 1 tablet by mouth daily. 07/11/16   [provider]  colchicine 0.6 MG tablet Take 0.6 mg by mouth daily as needed (gout). For gout    [provider]  docusate sodium (COLACE) 100 MG capsule Take 200 mg by mouth at bedtime.     [provider]  dutasteride (AVODART) 0.5 MG capsule Take 0.5 mg by mouth every morning.     [provider]  finasteride (PROSCAR) 5 MG tablet Take 5 mg by mouth at bedtime.    [provider]  furosemide (LASIX) 40 MG tablet Take 40 mg by mouth every morning.     [provider]  gabapentin (NEURONTIN) 300 MG capsule Take 300 mg by mouth 3 (three) times daily.      [provider]  ibuprofen (ADVIL,MOTRIN) 200 MG tablet Take 400-600 mg by mouth every 8 (eight) hours as needed for moderate pain. Reported on 01/20/2016    [provider]  insulin detemir (LEVEMIR) 100 UNIT/ML injection Inject 25 Units into the skin at bedtime.     [provider]  labetalol (NORMODYNE) 100 MG tablet Take 300 mg by mouth 2 (two) times daily.     [provider]  losartan (COZAAR) 100 MG tablet TAKE 1 TABLET BY MOUTH DAILY 09/05/16   Arnoldo Lenis, MD  Naproxen Sodium (ALEVE) 220 MG CAPS Take 1 capsule by mouth daily as needed.     [provider]  omega-3 acid ethyl esters (LOVAZA) 1 G capsule Take 1 g by mouth 2 (two) times daily.     [provider]  polyethylene glycol powder (GLYCOLAX/MIRALAX) powder TAKE 17MG (1CAPFUL) BY MOUTH(MIXED WITH WATER/JUICE) DAILY Patient taking differently: TAKE 17MG (1CAPFUL) BY MOUTH(MIXED WITH WATER/JUICE) EVERY EVENING. 05/02/15   Lafayette Dragon, MD  rosuvastatin (CRESTOR) 10 MG tablet Take 10 mg by mouth every morning.     [provider]  Tamsulosin HCl (FLOMAX) 0.4 MG CAPS Take 0.4 mg by mouth at bedtime. Reported on 01/20/2016    [provider]    Family History Family History  Problem Relation Age of Onset  . Breast cancer Mother        mets  . Diabetes Mother   . Heart disease Father        MI  . Colon cancer Brother   . Cancer Sister        male organs  . Diabetes Sister        family hx  . Arthritis Other        entire family  . Diabetes Brother        x 2    Social History Social History  Substance Use Topics  . Smoking status: Former Smoker    Packs/day: 2.00    Years: 47.00    Types: Cigarettes    Quit date: 05/18/2016  . Smokeless tobacco: Never Used     Comment: pt quits smoking periodically  . Alcohol use No     Allergies   Dilaudid [hydromorphone hcl]; Toradol [ketorolac tromethamine]; and Lyrica [pregabalin]   Review of Systems Review of Systems  Constitutional: Positive for activity change, chills and fever. Negative for appetite change.  HENT: Positive for dental problem and facial swelling. Negative for congestion, ear discharge, ear pain, nosebleeds, rhinorrhea, sneezing and tinnitus.   Eyes: Negative for photophobia, pain and discharge.  Respiratory: Negative for cough, choking, shortness of breath and wheezing.   Cardiovascular: Negative for chest pain, palpitations and leg swelling.  Gastrointestinal: Negative for abdominal pain, blood in stool, constipation, diarrhea, nausea and vomiting.    Genitourinary: Negative for difficulty urinating, dysuria, flank pain, frequency and hematuria.  Musculoskeletal: Negative for back pain,  gait problem, myalgias and neck pain.  Skin: Negative for color change, rash and wound.  Neurological: Positive for tremors. Negative for dizziness, seizures, syncope, facial asymmetry, speech difficulty, weakness and numbness.  Hematological: Negative for adenopathy. Does not bruise/bleed easily.  Psychiatric/Behavioral: Negative for agitation, confusion, hallucinations, self-injury and suicidal ideas. The patient is not nervous/anxious.      Physical Exam Updated Vital Signs Temp 99 F (37.2 C) (Rectal)   Physical Exam  Constitutional: He is oriented to person, place, and time. He appears well-developed and well-nourished.  Non-toxic appearance.  HENT:  Head: Normocephalic.  Right Ear: Tympanic membrane and external ear normal.  Left Ear: Tympanic membrane and external ear normal.  The face is flushed. The left greater than the right. There is tenderness just under the left orbit and into the left maxilla. There is also tenderness in the submental area. There couple of palpable nodes in the submental area.  There are multiple dental caries present. Multiple teeth missing present. There is some swelling of the gum on the left. The airway is patent. There is no swelling under the tongue. No visible abscess is appreciated.  Eyes: EOM and lids are normal. Pupils are equal, round, and reactive to light.  Neck: Normal range of motion. Neck supple. Carotid bruit is not present.  Cardiovascular: Normal rate, regular rhythm, normal heart sounds, intact distal pulses and normal pulses.   Pulmonary/Chest: Breath sounds normal. No respiratory distress.  Abdominal: Soft. Bowel sounds are normal. There is no tenderness. There is no guarding.  Musculoskeletal: Normal range of motion.  Lymphadenopathy:       Head (right side): No submandibular adenopathy present.        Head (left side): No submandibular adenopathy present.    He has no cervical adenopathy.  Neurological: He is alert and oriented to person, place, and time. He has normal strength. No cranial nerve deficit or sensory deficit.  Skin: Skin is warm and dry.  Psychiatric: He has a normal mood and affect. His speech is normal.  Nursing note and vitals reviewed.    ED Treatments / Results  Labs (all labs ordered are listed, but only abnormal results are displayed) Labs Reviewed  COMPREHENSIVE METABOLIC PANEL  LACTIC ACID, PLASMA  LACTIC ACID, PLASMA  CBC WITH DIFFERENTIAL/PLATELET  URINALYSIS, ROUTINE W REFLEX MICROSCOPIC    EKG  EKG Interpretation  Date/Time:  Sunday Feb 03 2017 09:06:08 EDT Ventricular Rate:  76 PR Interval:    QRS Duration: 96 QT Interval:  384 QTC Calculation: 432 R Axis:   9 Text Interpretation:  Sinus rhythm Baseline wander in lead(s) V1 V4 Confirmed by Fredia Sorrow [phone]) on 02/03/2017 9:19:29 AM       Radiology No results found.  Procedures Procedures (including critical care time)  Medications Ordered in ED Medications  HYDROcodone-acetaminophen (NORCO/VICODIN) 5-325 MG per tablet 1 tablet (1 tablet Oral Given 02/03/17 1021)  ondansetron (ZOFRAN) tablet 4 mg (4 mg Oral Given 02/03/17 1021)     Initial Impression / Assessment and Plan / ED Course Patient seen with me by Dr. Rogene Houston   I have reviewed the triage vital signs and the nursing notes.  Pertinent labs & imaging results that were available during my care of the patient were reviewed by me and considered in my medical decision making (see chart for details).       Final Clinical Impressions(s) / ED Diagnoses MDM Temperature is 99. Patient is awake and alert. There is a tremor involving the upper  extremities.  Complete blood count is well within normal limits.  The lactic acid is normal at 1.5, the competence of metabolic panel is well within normal limits. The  complete blood count is normal. Urinalysis is normal. The CT scan of the maxillofacial structures shows a periapical lucency about the posterior roots of the left maxillary molar without associated soft tissue swelling. There is a prominent dental caries involving the right paramedial maxillary incisor also associated with lucency. There is diffuse maxillary ethmoid sinus disease present.  Patient treated with clindamycin and pain medication. The patient is strongly advised to see his dentist next week as sone as possible. Patient and the spouse are in agreement with this discharge plan.    Final diagnoses:  Dental abscess  Left facial pressure and pain    New Prescriptions New Prescriptions   No medications on file     Lily Kocher, Hershal Coria 02/04/17 1329    Fredia Sorrow, MD 02/05/17 2321

## 2017-02-15 DIAGNOSIS — I10 Essential (primary) hypertension: Secondary | ICD-10-CM | POA: Diagnosis not present

## 2017-02-15 DIAGNOSIS — E559 Vitamin D deficiency, unspecified: Secondary | ICD-10-CM | POA: Diagnosis not present

## 2017-02-15 DIAGNOSIS — E1140 Type 2 diabetes mellitus with diabetic neuropathy, unspecified: Secondary | ICD-10-CM | POA: Diagnosis not present

## 2017-02-19 DIAGNOSIS — J0100 Acute maxillary sinusitis, unspecified: Secondary | ICD-10-CM | POA: Diagnosis not present

## 2017-02-19 DIAGNOSIS — I10 Essential (primary) hypertension: Secondary | ICD-10-CM | POA: Diagnosis not present

## 2017-02-19 DIAGNOSIS — M069 Rheumatoid arthritis, unspecified: Secondary | ICD-10-CM | POA: Diagnosis not present

## 2017-02-19 DIAGNOSIS — E782 Mixed hyperlipidemia: Secondary | ICD-10-CM | POA: Diagnosis not present

## 2017-02-19 DIAGNOSIS — M109 Gout, unspecified: Secondary | ICD-10-CM | POA: Diagnosis not present

## 2017-02-19 DIAGNOSIS — E1140 Type 2 diabetes mellitus with diabetic neuropathy, unspecified: Secondary | ICD-10-CM | POA: Diagnosis not present

## 2017-02-19 DIAGNOSIS — G629 Polyneuropathy, unspecified: Secondary | ICD-10-CM | POA: Diagnosis not present

## 2017-02-20 DIAGNOSIS — B078 Other viral warts: Secondary | ICD-10-CM | POA: Diagnosis not present

## 2017-02-20 DIAGNOSIS — L570 Actinic keratosis: Secondary | ICD-10-CM | POA: Diagnosis not present

## 2017-02-20 DIAGNOSIS — X32XXXD Exposure to sunlight, subsequent encounter: Secondary | ICD-10-CM | POA: Diagnosis not present

## 2017-02-25 ENCOUNTER — Telehealth: Payer: Self-pay | Admitting: Adult Health

## 2017-02-25 NOTE — Telephone Encounter (Signed)
Spoke with Doroteo Bradford at [facility]  She states we had called to request a DL for tomorrow's ov but they do not service his CPAP  They are unsure where CPAP came from [facility] informing him to bring his chip or CPAP so we may obtain a DL

## 2017-02-25 NOTE — Telephone Encounter (Signed)
Pt has pending OV on 02/26/17 with TP. lmtcb x1 for Mariann Laster at [facility]

## 2017-02-26 ENCOUNTER — Encounter: Payer: Self-pay | Admitting: Adult Health

## 2017-02-26 ENCOUNTER — Ambulatory Visit (INDEPENDENT_AMBULATORY_CARE_PROVIDER_SITE_OTHER): Payer: Medicare HMO | Admitting: Adult Health

## 2017-02-26 DIAGNOSIS — G4733 Obstructive sleep apnea (adult) (pediatric): Secondary | ICD-10-CM | POA: Diagnosis not present

## 2017-02-26 DIAGNOSIS — E663 Overweight: Secondary | ICD-10-CM

## 2017-02-26 NOTE — Progress Notes (Signed)
@Patient  ID: [name], male    DOB: July 25, 1946, 71 y.o.   MRN: [id number]  Chief Complaint  Patient presents with  . Follow-up    OSA    Referring provider: [name], MD  HPI: 71 yo male with OSA (prevoiuos pt of [name] )   TEST  NPSG 2003 [facility] 16/hr   02/26/2017 Follow up : OSA  Patient presents for annual follow-up for sleep apnea. He was last seen in October 2016. Patient remains on BiPAP at bedtime. He says he is doing well. He wears it for about 6-8 hours each night. Patient says he never misses a night. Download was requested. Patient feels rested with no significant daytime sleepiness. Patient needs new supplies order for nasal mask and supplies were sent.    Allergies  Allergen Reactions  . Dilaudid [Hydromorphone Hcl] Other (See Comments)    "acts a little off"  . Toradol [Ketorolac Tromethamine] Other (See Comments)    CauseD  hallucination ?-states not sure/ avoids per his MD  . Lyrica [Pregabalin] Nausea Only and Other (See Comments)    Make me feel "bad"    Immunization History  Administered Date(s) Administered  . Influenza Split 08/31/2012  . Influenza, High Dose Seasonal PF 06/17/2016  . Influenza,inj,Quad PF,36+ Mos 07/20/2013  . Pneumococcal Conjugate-13 08/31/2012    Past Medical History:  Diagnosis Date  . Bilateral renal cysts   . Bladder cancer ([name]) UROLOGIST-  [name]   RECURRENT 11/ 2017 --  hx turbt in 2004 and 08-19-2013  . BPH without obstruction/lower urinary tract symptoms   . Cervical fusion syndrome    LEFT ARM NUMBNESS--  CONTROLLED W/ GABAPENTIN  . Coronary artery disease    CARDIOLOGIST-- [name] [name])  . DDD (degenerative disc disease), cervical   . DDD (degenerative disc disease), lumbar   . Diverticulosis of sigmoid colon   . History of acute gouty arthritis   . History of adenomatous polyp of colon    2001;  2012- non-malignant leiomyoma;  04-07-2014  tubualr adenoma and hyperplastic  polyp's  . History of prostate cancer UROLOGIST-  [name]   S/P  RADIOACTIVE SEED IMPLANTS 2004  . HOH (hard of hearing)    no hearing aids  . Hyperlipidemia   . Hypertension   . OA (osteoarthritis)   . Occlusion and stenosis of carotid artery without mention of cerebral infarction CARDIOLOGIST  -- Ricky  Roderic Palau Miles   LAST DUPLEX  11-23-2015  RICA  (DUPLEX DOPPLER 01-20-2016 RICA 28-41% PROXIMAL) /   LICA <32% (HAD CONSULT W/ [name] , VASCULAR SURGEON 01-20-2016)  . OSA on CPAP    BiPAP  . Spondylosis, cervical   . Type 2 diabetes mellitus ([name])   . Wears glasses     Tobacco History: History  Smoking Status  . Current Every Day Smoker  . Packs/day: 2.00  . Years: 47.00  . Types: Cigarettes  . Last attempt to quit: 05/18/2016  Smokeless Tobacco  . Never Used    Comment: pt quits smoking periodically :: started smoking again Jan 2018 1ppd   Ready to quit: No Counseling given: Yes   Outpatient Encounter Prescriptions as of 02/26/2017  Medication Sig  . acetaminophen (TYLENOL) 500 MG tablet Take 1,000 mg by mouth every 6 (six) hours as needed for moderate pain.  Marland Kitchen allopurinol (ZYLOPRIM) 300 MG tablet Take 300 mg by mouth at bedtime.   Marland Kitchen amLODipine (NORVASC) 10 MG tablet TAKE 1 TABLET  BY MOUTH EVERY DAY  . aspirin 81 MG EC tablet Take 81 mg by mouth at bedtime.   . celecoxib (CELEBREX) 200 MG capsule Take 200 mg by mouth every morning.  . clindamycin (CLEOCIN) 300 MG capsule Take 1 capsule (300 mg total) by mouth 3 (three) times daily.  . clopidogrel (PLAVIX) 75 MG tablet Take 1 tablet by mouth daily.  . colchicine 0.6 MG tablet Take 0.6 mg by mouth daily as needed (gout). For gout  . docusate sodium (COLACE) 100 MG capsule Take 200 mg by mouth at bedtime.   . dutasteride (AVODART) 0.5 MG capsule Take 0.5 mg by mouth every morning.   . furosemide (LASIX) 40 MG tablet Take 40 mg by mouth every morning.   . gabapentin (NEURONTIN) 300 MG capsule Take 300 mg by mouth 3 (three)  times daily.    Marland Kitchen HYDROcodone-acetaminophen (NORCO/VICODIN) 5-325 MG tablet Take 1 tablet by mouth every 4 (four) hours as needed.  Marland Kitchen ibuprofen (ADVIL,MOTRIN) 200 MG tablet Take 400-600 mg by mouth every 8 (eight) hours as needed for moderate pain. Reported on 01/20/2016  . insulin detemir (LEVEMIR) 100 UNIT/ML injection Inject 20 Units into the skin at bedtime.   Marland Kitchen labetalol (NORMODYNE) 100 MG tablet Take 200 mg by mouth 2 (two) times daily.   Marland Kitchen losartan (COZAAR) 100 MG tablet TAKE 1 TABLET BY MOUTH DAILY  . Naproxen Sodium (ALEVE) 220 MG CAPS Take 1 capsule by mouth daily as needed.  Marland Kitchen omega-3 acid ethyl esters (LOVAZA) 1 G capsule Take 1 g by mouth 2 (two) times daily.   . polyethylene glycol powder (GLYCOLAX/MIRALAX) powder TAKE 17MG (1CAPFUL) BY MOUTH(MIXED WITH WATER/JUICE) DAILY (Patient taking differently: TAKE 17MG (1CAPFUL) BY MOUTH(MIXED WITH WATER/JUICE) EVERY EVENING.)  . Potassium Gluconate 595 MG CAPS Take 1 capsule by mouth daily.  . rosuvastatin (CRESTOR) 10 MG tablet Take 10 mg by mouth every morning.   Marland Kitchen tiZANidine (ZANAFLEX) 4 MG tablet Take 4 mg by mouth every 8 (eight) hours as needed for muscle spasms.   No facility-administered encounter medications on file as of 02/26/2017.      Review of Systems  Constitutional:   No  weight loss, night sweats,  Fevers, chills, fatigue, or  lassitude.  HEENT:   No headaches,  Difficulty swallowing,  Tooth/dental problems, or  Sore throat,                No sneezing, itching, ear ache, nasal congestion, post nasal drip,   CV:  No chest pain,  Orthopnea, PND, swelling in lower extremities, anasarca, dizziness, palpitations, syncope.   GI  No heartburn, indigestion, abdominal pain, nausea, vomiting, diarrhea, change in bowel habits, loss of appetite, bloody stools.   Resp: No shortness of breath with exertion or at rest.  No excess mucus, no productive cough,  No non-productive cough,  No coughing up of blood.  No change in color of  mucus.  No wheezing.  No chest wall deformity  Skin: no rash or lesions.  GU: no dysuria, change in color of urine, no urgency or frequency.  No flank pain, no hematuria   MS:  No joint pain or swelling.  No decreased range of motion.  No back pain.    Physical Exam  BP 132/84 (BP Location: Left Arm, Cuff Size: Normal)   Pulse 80   Ht 5\' 9"  (1.753 m)   Wt 213 lb 3.2 oz (96.7 kg)   SpO2 98%   BMI 31.48 kg/m   GEN: A/Ox3; pleasant ,  NAD, obese    HEENT:  [place]/AT,  EACs-clear, TMs-wnl, NOSE-clear, THROAT-clear, no lesions, no postnasal drip or exudate noted. Poor dentition , class 2-3 MP airway   NECK:  Supple w/ fair ROM; no JVD; normal carotid impulses w/o bruits; no thyromegaly or nodules palpated; no lymphadenopathy.    RESP  Clear  P & A; w/o, wheezes/ rales/ or rhonchi. no accessory muscle use, no dullness to percussion  CARD:  RRR, no m/r/g, no peripheral edema, pulses intact, no cyanosis or clubbing.  GI:   Soft & nt; nml bowel sounds; no organomegaly or masses detected.   Musco: Warm bil, no deformities or joint swelling noted.   Neuro: alert, no focal deficits noted.    Skin: Warm, no lesions or rashes    Lab Results:  CBC  No results found for: PROBNP  Imaging: Dg Chest 2 View  Result Date: 02/03/2017 CLINICAL DATA:  Chills and fever beginning yesterday. EXAM: CHEST  2 VIEW COMPARISON:  10/10/2016 FINDINGS: Cardiac silhouette is normal in size. Normal mediastinal and hilar contours. Clear lungs.  No pleural effusion or pneumothorax. Status post anterior cervical spine fusion and posterior lumbar spine fusion. Skeletal structures are demineralized. IMPRESSION: No acute cardiopulmonary disease. Electronically Signed   By: Lajean Manes M.D.   On: 02/03/2017 11:30   Ct Maxillofacial W Contrast  Result Date: 02/03/2017 CLINICAL DATA:  Soreness at the left maxillary teeth. Left facial swelling. Dental pain. EXAM: CT MAXILLOFACIAL WITH CONTRAST TECHNIQUE:  Multidetector CT imaging of the maxillofacial structures was performed. Multiplanar CT image reconstructions were also generated. A small metallic BB was placed on the right temple in order to reliably differentiate right from left. CONTRAST:  63mL ISOVUE-300 IOPAMIDOL (ISOVUE-300) INJECTION 61% COMPARISON:  None. FINDINGS: Osseous: There is minimal lucency about the posterior roots of the residual left maxillary molar. A metallic crown is in place. No dental caries is evident. A large dental caries is present in the median right maxillary incisor with extensive air lucency. No other acute osseous abnormalities present. Facial bones are intact. Nasal bones are normal. The mandible is intact and located. Orbits: Bilateral lens replacements are present. The globes and orbits are within normal limits. The cavernous sinus is normal. The optic chiasm is within normal limits. Sinuses: Bilateral maxillary antrostomies noted. Left greater than right ethmoidectomies are present. Extensive residual maxillary sinus disease is present, left greater than right. Chronic paranasal sinus wall thickening is again seen. No fluid levels are present. Mucosal disease extends into the right frontal sinus. The sphenoid sinuses are clear. The mastoid air cells are clear. Soft tissues: No significant facial soft tissue swelling is present. No significant adenopathy is noted. Limited intracranial: The visualized intracranial contents are normal. IMPRESSION: 1. Minimal periapical lucency about the posterior roots of the residual left maxillary molar without associated soft tissue swelling or abscess. 2. Prominent dental caries involving the right paramedian maxillary incisor. Associated periapical lucency. 3. Residual diffuse bilateral maxillary ethmoid sinus disease despite prior maxillary antrostomy and ethmoidectomies. Electronically Signed   By: San Morelle M.D.   On: 02/03/2017 13:00     Assessment & Plan:   OSA  (obstructive sleep apnea) Good control  Check download  Order for supplies   Plan  Patient Instructions  Will send an order to your home care company to get new supplies. Continue on CPAP At bedtime  .  Keep up good work .  Work on weight loss Do not drive if sleepy.  Followup with Ricky.  Sood in 1 year and As needed      Overweight Wt loss      Rexene Edison, NP 02/26/2017

## 2017-02-26 NOTE — Assessment & Plan Note (Signed)
Wt loss  

## 2017-02-26 NOTE — Assessment & Plan Note (Signed)
Good control  Check download  Order for supplies   Plan  Patient Instructions  Will send an order to your home care company to get new supplies. Continue on CPAP At bedtime  .  Keep up good work .  Work on weight loss Do not drive if sleepy.  Followup with Dr. Halford Chessman in 1 year and As needed

## 2017-02-26 NOTE — Addendum Note (Signed)
Addended by: Parke Poisson E on: 02/26/2017 11:55 AM   Modules accepted: Orders

## 2017-02-26 NOTE — Patient Instructions (Signed)
Will send an order to your home care company to get new supplies. Continue on CPAP At bedtime  .  Keep up good work .  Work on weight loss Do not drive if sleepy.  Followup with Dr. Halford Chessman in 1 year and As needed

## 2017-02-27 NOTE — Progress Notes (Signed)
I have reviewed and agree with assessment/plan.  Chesley Mires, MD [facility] Pulmonary/Critical Care 02/27/2017, 10:01 AM Pager:  [phone]

## 2017-03-12 ENCOUNTER — Ambulatory Visit: Payer: Medicare HMO

## 2017-03-12 ENCOUNTER — Ambulatory Visit (INDEPENDENT_AMBULATORY_CARE_PROVIDER_SITE_OTHER): Payer: Medicare HMO | Admitting: Orthopedic Surgery

## 2017-03-12 ENCOUNTER — Ambulatory Visit (INDEPENDENT_AMBULATORY_CARE_PROVIDER_SITE_OTHER): Payer: Medicare HMO

## 2017-03-12 DIAGNOSIS — M79672 Pain in left foot: Secondary | ICD-10-CM | POA: Diagnosis not present

## 2017-03-12 DIAGNOSIS — M7052 Other bursitis of knee, left knee: Secondary | ICD-10-CM | POA: Diagnosis not present

## 2017-03-12 DIAGNOSIS — M722 Plantar fascial fibromatosis: Secondary | ICD-10-CM

## 2017-03-12 NOTE — Progress Notes (Signed)
NEW PATIENT OFFICE VISIT    Chief complaint pain left foot  71 year old male presents with pain on the inferior aspect of his left foot with no history of trauma. He's had it for 1-2 weeks and initially it radiated up into his calf. Most of that has resolved.  The pain on the bottom of the heel is sharp and worse when he first puts his foot down although it is constant    Review of Systems  Musculoskeletal: Positive for joint pain.       Pain medial aspect left knee at the pes tendon insertion     Past Medical History:  Diagnosis Date  . Bilateral renal cysts   . Bladder cancer ([place]) UROLOGIST-  DR [facility]   RECURRENT 11/ 2017 --  hx turbt in 2004 and 08-19-2013  . BPH without obstruction/lower urinary tract symptoms   . Cervical fusion syndrome    LEFT ARM NUMBNESS--  CONTROLLED W/ GABAPENTIN  . Coronary artery disease    CARDIOLOGIST-- DR BRANCH [name])  . DDD (degenerative disc disease), cervical   . DDD (degenerative disc disease), lumbar   . Diverticulosis of sigmoid colon   . History of acute gouty arthritis   . History of adenomatous polyp of colon    2001;  2012- non-malignant leiomyoma;  04-07-2014  tubualr adenoma and hyperplastic polyp's  . History of prostate cancer UROLOGIST-  DR [facility]   S/P  RADIOACTIVE SEED IMPLANTS 2004  . HOH (hard of hearing)    no hearing aids  . Hyperlipidemia   . Hypertension   . OA (osteoarthritis)   . Occlusion and stenosis of carotid artery without mention of cerebral infarction CARDIOLOGIST  -- DR  [name]   LAST DUPLEX  11-23-2015  RICA  (DUPLEX DOPPLER 01-20-2016 RICA 50-09% PROXIMAL) /   LICA <38% (HAD CONSULT W/ [name] , VASCULAR SURGEON 01-20-2016)  . OSA on CPAP    BiPAP  . Spondylosis, cervical   . Type 2 diabetes mellitus ([place])   . Wears glasses     Past Surgical History:  Procedure Laterality Date  . ANTERIOR REMOVAL CAGE AND PLATE C3-C7/ CORPECTOMY C7 (FX)/  ALLOGRAFT AND FUSION C3 -- T1/   POSTERIOR DECOMPRESSION BILATERAL LAMINECTOMY C4 -- 6 & PARTIAL C3/  POSTEROLATERAL ARTHRODESIS C3 - T1  06/05/2005  . APPENDECTOMY  1990'S  . CATARACT EXTRACTION W/ INTRAOCULAR LENS  IMPLANT, BILATERAL  2013  . CERVICAL FUSION  05/11/2005   C3  --  C7/  due to post op quadriparesis same day s/p  anterior C 4,5,6, colpectomy, decompression, removal epidural hematoma, foraminnotomy, cage and plate  . COLONOSCOPY  last one 04-07-2014  . CYSTOSCOPY W/ RETROGRADES Bilateral 08/19/2013   Procedure: CYSTOSCOPY WITH RETROGRADE PYELOGRAM;  Surgeon: Molli Hazard, MD;  Location: [facility];  Service: Urology;  Laterality: Bilateral;  . CYSTOSCOPY W/ RETROGRADES Bilateral 07/18/2016   Procedure: CYSTOSCOPY WITH RETROGRADE PYELOGRAM;  Surgeon: Alexis Frock, MD;  Location: [facility];  Service: Urology;  Laterality: Bilateral;  . ENDOSCOPIC REPAIR CSF LEAK VIA NASAL PASSAGE  2011  . INTRAOPERATIVE ARTERIOGRAM  CATH LAB  01-05-2009  DR Einar Gip   RICA   ACUTE ANGLE 80-85% STENOSIS  . LUMBAR FUSION  03-26-2012   L3 --  L5  . RADIOACTIVE PROSTATE SEED IMPLANTS  08-19-2003  . SEPTOPLASTY  1998  . TRANSURETHRAL RESECTION OF BLADDER TUMOR N/A 08/19/2013   Procedure: TRANSURETHRAL RESECTION OF BLADDER TUMOR (TURBT);  Surgeon: Dennard Schaumann  Jasmine December, MD;  Location[facility];  Service: Urology;  Laterality: N/A;  . TRANSURETHRAL RESECTION OF BLADDER TUMOR N/A 07/18/2016   Procedure: TRANSURETHRAL RESECTION OF BLADDER TUMOR (TURBT);  Surgeon: Alexis Frock, MD;  Location: [facility];  Service: Urology;  Laterality: N/A;  . YAG LASER APPLICATION  [id number]   Procedure: YAG LASER APPLICATION;  Surgeon: Elta Guadeloupe T. Gershon Crane, MD;  Location: [facility];  Service: Ophthalmology;  Laterality: Right;    Family History  Problem Relation Age of Onset  . Breast cancer Mother        mets  . Diabetes Mother   . Heart disease Father        MI  . Colon cancer  Brother   . Cancer Sister        male organs  . Diabetes Sister        family hx  . Arthritis Other        entire family  . Diabetes Brother        x 2   Social History  Substance Use Topics  . Smoking status: Current Every Day Smoker    Packs/day: 2.00    Years: 47.00    Types: Cigarettes    Last attempt to quit: 05/18/2016  . Smokeless tobacco: Never Used     Comment: pt quits smoking periodically :: started smoking again Jan 2018 1ppd  . Alcohol use No    There were no vitals taken for this visit.  Physical Exam  Constitutional: He is oriented to person, place, and time. He appears well-developed and well-nourished.  Vital signs have been reviewed and are stable. Gen. appearance the patient is well-developed and well-nourished with normal grooming and hygiene.   Musculoskeletal:  GAIT IS slightly antalgic  Neurological: He is alert and oriented to person, place, and time.  Skin: Skin is warm and dry. No erythema.  Psychiatric: He has a normal mood and affect.  Vitals reviewed.  Normal dorsalis pedis pulse Normal sensation in the left foot  Left Ankle Exam  Swelling: None.  Tenderness  The patient is experiencing tenderness in the Plantar fascial insertion.  Range of Motion  Dorsiflexion:     Normal Plantar flexion: Normal Inversion:         Normal Eversion:         Normal  Muscle Strength  Dorsiflexion:      5/5 Plantar flexion:      5/5 Anterior tibial:        Posterior tibial:      Gastrosoleus:       Peroneal muscle:  Tests  Anterior drawer: Negative.  Left Knee Exam  Swelling: None Effusion: No  Tenderness  The patient is experiencing tenderness in the pes anserinus.  Range of Motion  Extension: -5 Flexion:     130  Tests  Drawer:       Anterior - Negative     Posterior - Negative  Right Knee Exam   Tenderness  None  Range of Motion  Normal right knee ROM  Muscle Strength  Normal right knee strength     Encounter Diagnoses   Name Primary?  . Left foot pain   . Pes anserinus bursitis of left knee   . Plantar fasciitis Yes   Procedure note Inject plantar fascia left foot   Timeout was completed to confirm the site of injection  The medications used were 40 mg of Depo-Medrol and 1% lidocaine 3 cc  Anesthesia was provided  by ethyl chloride and the skin was prepped with alcohol.  After cleaning the skin with alcohol a 25-gauge needle was used to inject the plantar fascia, no complications were noted sterile bandage was applied Procedure note left knee injection for bursitis  verbal consent was obtained to inject left knee PES BURSA  Timeout was completed to confirm the site of injection  The medications used were 40 mg of Depo-Medrol and 1% lidocaine 3 cc  Anesthesia was provided by ethyl chloride and the skin was prepped with alcohol.  After cleaning the skin with alcohol a 25-gauge needle was used to inject the right knee bursa.  There were no complications and a sterile bandage was applied   PLAN:  Recommend stretching exercises at home Injection plantar fascia and pes bursitis  Fu as needed

## 2017-03-12 NOTE — Patient Instructions (Addendum)
You have received an injection of steroids into the joint. 15% of patients will have increased pain within the 24 hours postinjection.   This is transient and will go away.   We recommend that you use ice packs on the injection site for 20 minutes every 2 hours and extra strength Tylenol 2 tablets every 8 as needed until the pain resolves.  If you continue to have pain after taking the Tylenol and using the ice please call the office for further instructions.    Heel Spur A heel spur is a bony growth that forms on the bottom of your heel bone (calcaneus). Heel spurs are common and do not always cause pain. However, heel spurs often cause inflammation in the strong band of tissue that runs underneath the bone of your foot (plantar fascia). When this happens, you may feel pain on the bottom of your foot, near your heel. What are the causes? The cause of heel spurs is not completely understood. They may be caused by pressure on the heel. Or, they may stem from the muscle attachments (tendons) near the spur pulling on the heel. What increases the risk? You may be at risk for a heel spur if you:  Are older than 40.  Are overweight.  Have wear and tear arthritis (osteoarthritis).  Have plantar fascia inflammation.  What are the signs or symptoms? Some people have heel spurs but no symptoms. If you do have symptoms, they may include:  Pain in the bottom of your heel.  Pain that is worse when you first get out of bed.  Pain that gets worse after walking or standing.  How is this diagnosed? Your health care provider may diagnose a heel spur based on your symptoms and a physical exam. You may also have an X-ray of your foot to check for a bony growth coming from the calcaneus. How is this treated? Treatment aims to relieve the pain from the heel spur. This may include:  Stretching exercises.  Losing weight.  Wearing specific shoes, inserts, or orthotics for comfort and  support.  Wearing splints at night to properly position your feet.  Taking over-the-counter medicine to relieve pain.  Being treated with high-intensity sound waves to break up the heel spur (extracorporeal shock wave therapy).  Getting steroid injections in your heel to reduce swelling and ease pain.  Having surgery if your heel spur causes long-term (chronic) pain.  Follow these instructions at home:  Take medicines only as directed by your health care provider.  Ask your health care provider if you should use ice or cold packs on the painful areas of your heel or foot.  Avoid activities that cause you pain until you recover or as directed by your health care provider.  Stretch before exercising or being physically active.  Wear supportive shoes that fit well as directed by your health care provider. You might need to buy new shoes. Wearing old shoes or shoes that do not fit correctly may not provide the support that you need.  Lose weight if your health care provider thinks you should. This can relieve pressure on your foot that may be causing pain and discomfort. Contact a health care provider if:  Your pain continues or gets worse. This information is not intended to replace advice given to you by your health care provider. Make sure you discuss any questions you have with your health care provider. Document Released: 10/10/2005 Document Revised: 02/09/2016 Document Reviewed: 11/04/2013 Elsevier Interactive Patient Education  2018 [employer].  Plantar Fasciitis Rehab Ask your health care provider which exercises are safe for you. Do exercises exactly as told by your health care provider and adjust them as directed. It is normal to feel mild stretching, pulling, tightness, or discomfort as you do these exercises, but you should stop right away if you feel sudden pain or your pain gets worse. Do not begin these exercises until told by your health care provider. Stretching and  range of motion exercises These exercises warm up your muscles and joints and improve the movement and flexibility of your foot. These exercises also help to relieve pain. Exercise A: Plantar fascia stretch  1. Sit with your left / right leg crossed over your opposite knee. 2. Hold your heel with one hand with that thumb near your arch. With your other hand, hold your toes and gently pull them back toward the top of your foot. You should feel a stretch on the bottom of your toes or your foot or both. 3. Hold this stretch for______2____ seconds. 4. Slowly release your toes and return to the starting position. Repeat ______15____ times. Complete this exercise ___2_______ times a day. Exercise B: Gastroc, standing  1. Stand with your hands against a wall. 2. Extend your left / right leg behind you, and bend your front knee slightly. 3. Keeping your heels on the floor and keeping your back knee straight, shift your weight toward the wall without arching your back. You should feel a gentle stretch in your left / right calf. 4. Hold this position for _____2_____ seconds. Repeat _____15_____ times. Complete this exercise _______2___ times a day. Exercise C: Soleus, standing 1. Stand with your hands against a wall. 2. Extend your left / right leg behind you, and bend your front knee slightly. 3. Keeping your heels on the floor, bend your back knee and slightly shift your weight over the back leg. You should feel a gentle stretch deep in your calf. 4. Hold this position for ___2_______ seconds. Repeat __15________ times. Complete this exercise ____2______ times a day. Exercise D: Gastrocsoleus, standing 1. Stand with the ball of your left / right foot on a step. The ball of your foot is on the walking surface, right under your toes. 2. Keep your other foot firmly on the same step. 3. Hold onto the wall or a railing for balance. 4. Slowly lift your other foot, allowing your body weight to press your  heel down over the edge of the step. You should feel a stretch in your left / right calf. 5. Hold this position for ______2____ seconds. 6. Return both feet to the step. 7. Repeat this exercise with a slight bend in your left / right knee. Repeat ____15______ times with your left / right knee straight and ________15__ times with your left / right knee bent. Complete this exercise ______2____ times a day.

## 2017-03-15 ENCOUNTER — Telehealth: Payer: Self-pay | Admitting: Adult Health

## 2017-03-15 NOTE — Telephone Encounter (Signed)
Pt seen 6.12.18 for OSA follow up VPAP download received from [facility] and reviewed by TP:  Great compliance.  +leaks - ?needs a new mask.  Control not so good - AHI 8/hr.  What pressure is he on?   Auto set?  LMOM TCB x1 for pt/spouse   Called [employer] and spoke with Mariann Laster in respiratory - she reported that patient did not originally get his machine from that company so they are unable to see what the auto range is on pt's machine; she can only see the pressures listed on the download.  [place] and left message with Tan for the respiratory therapist to call me back.

## 2017-03-18 NOTE — Telephone Encounter (Signed)
lmtcb x2 for RT with [employer]

## 2017-03-22 NOTE — Telephone Encounter (Signed)
[facility] and the RT stated that he will fax over the last download on the patient that shows his settings. The last download they have is from Oct 2016 and it was a auto setting. Download received and given to [place]. Will hold this message in her box

## 2017-04-04 NOTE — Telephone Encounter (Signed)
[name] is there something that you need help with on this phone message?  Are we able to close this message now?  Please advise. thanks

## 2017-04-04 NOTE — Telephone Encounter (Signed)
Will close message and addend if TP has any additional recommendations or if we are able to receive an updated download.

## 2017-04-09 DIAGNOSIS — N401 Enlarged prostate with lower urinary tract symptoms: Secondary | ICD-10-CM | POA: Diagnosis not present

## 2017-04-09 DIAGNOSIS — C670 Malignant neoplasm of trigone of bladder: Secondary | ICD-10-CM | POA: Diagnosis not present

## 2017-04-09 DIAGNOSIS — C61 Malignant neoplasm of prostate: Secondary | ICD-10-CM | POA: Diagnosis not present

## 2017-04-09 DIAGNOSIS — R3915 Urgency of urination: Secondary | ICD-10-CM | POA: Diagnosis not present

## 2017-04-29 ENCOUNTER — Encounter: Payer: Self-pay | Admitting: Orthopedic Surgery

## 2017-04-29 ENCOUNTER — Ambulatory Visit (INDEPENDENT_AMBULATORY_CARE_PROVIDER_SITE_OTHER): Payer: Medicare HMO | Admitting: Orthopedic Surgery

## 2017-04-29 ENCOUNTER — Ambulatory Visit (INDEPENDENT_AMBULATORY_CARE_PROVIDER_SITE_OTHER): Payer: Medicare HMO

## 2017-04-29 DIAGNOSIS — M25562 Pain in left knee: Secondary | ICD-10-CM

## 2017-04-29 DIAGNOSIS — M23322 Other meniscus derangements, posterior horn of medial meniscus, left knee: Secondary | ICD-10-CM | POA: Diagnosis not present

## 2017-04-29 DIAGNOSIS — M1712 Unilateral primary osteoarthritis, left knee: Secondary | ICD-10-CM

## 2017-04-29 NOTE — Progress Notes (Signed)
New problem OFFICE VISIT    Chief Complaint  Patient presents with  . Knee Pain    has increased left knee pain worse with ROM     71 year old male presents for evaluation of left knee pain  The patient is 71 years old complains of severe pain medial aspect left knee radiating down towards the left foot. This is worsening and is associated with decreased range of motion and mild swelling    Review of Systems  Musculoskeletal: Positive for back pain, joint pain and myalgias.  Neurological: Negative for tingling and sensory change.    Past Medical History:  Diagnosis Date  . Bilateral renal cysts   . Bladder cancer ([place]) UROLOGIST-  DR [facility]   RECURRENT 11/ 2017 --  hx turbt in 2004 and 08-19-2013  . BPH without obstruction/lower urinary tract symptoms   . Cervical fusion syndrome    LEFT ARM NUMBNESS--  CONTROLLED W/ GABAPENTIN  . Coronary artery disease    CARDIOLOGIST-- DR BRANCH [name])  . DDD (degenerative disc disease), cervical   . DDD (degenerative disc disease), lumbar   . Diverticulosis of sigmoid colon   . History of acute gouty arthritis   . History of adenomatous polyp of colon    2001;  2012- non-malignant leiomyoma;  04-07-2014  tubualr adenoma and hyperplastic polyp's  . History of prostate cancer UROLOGIST-  DR [facility]   S/P  RADIOACTIVE SEED IMPLANTS 2004  . HOH (hard of hearing)    no hearing aids  . Hyperlipidemia   . Hypertension   . OA (osteoarthritis)   . Occlusion and stenosis of carotid artery without mention of cerebral infarction CARDIOLOGIST  -- DR  [name]   LAST DUPLEX  11-23-2015  RICA  (DUPLEX DOPPLER 01-20-2016 RICA 25-95% PROXIMAL) /   LICA <63% (HAD CONSULT W/ [name] , VASCULAR SURGEON 01-20-2016)  . OSA on CPAP    BiPAP  . Spondylosis, cervical   . Type 2 diabetes mellitus ([place])   . Wears glasses     Past Surgical History:  Procedure Laterality Date  . ANTERIOR REMOVAL CAGE AND PLATE C3-C7/ CORPECTOMY C7  (FX)/  ALLOGRAFT AND FUSION C3 -- T1/  POSTERIOR DECOMPRESSION BILATERAL LAMINECTOMY C4 -- 6 & PARTIAL C3/  POSTEROLATERAL ARTHRODESIS C3 - T1  06/05/2005  . APPENDECTOMY  1990'S  . CATARACT EXTRACTION W/ INTRAOCULAR LENS  IMPLANT, BILATERAL  2013  . CERVICAL FUSION  05/11/2005   C3  --  C7/  due to post op quadriparesis same day s/p  anterior C 4,5,6, colpectomy, decompression, removal epidural hematoma, foraminnotomy, cage and plate  . COLONOSCOPY  last one 04-07-2014  . CYSTOSCOPY W/ RETROGRADES Bilateral 08/19/2013   Procedure: CYSTOSCOPY WITH RETROGRADE PYELOGRAM;  Surgeon: Molli Hazard, MD;  Location: [facility];  Service: Urology;  Laterality: Bilateral;  . CYSTOSCOPY W/ RETROGRADES Bilateral 07/18/2016   Procedure: CYSTOSCOPY WITH RETROGRADE PYELOGRAM;  Surgeon: Alexis Frock, MD;  Location: [facility];  Service: Urology;  Laterality: Bilateral;  . ENDOSCOPIC REPAIR CSF LEAK VIA NASAL PASSAGE  2011  . INTRAOPERATIVE ARTERIOGRAM  CATH LAB  01-05-2009  DR Einar Gip   RICA   ACUTE ANGLE 80-85% STENOSIS  . LUMBAR FUSION  03-26-2012   L3 --  L5  . RADIOACTIVE PROSTATE SEED IMPLANTS  08-19-2003  . SEPTOPLASTY  1998  . TRANSURETHRAL RESECTION OF BLADDER TUMOR N/A 08/19/2013   Procedure: TRANSURETHRAL RESECTION OF BLADDER TUMOR (TURBT);  Surgeon: Molli Hazard, MD;  Location: [facility];  Service: Urology;  Laterality: N/A;  . TRANSURETHRAL RESECTION OF BLADDER TUMOR N/A 07/18/2016   Procedure: TRANSURETHRAL RESECTION OF BLADDER TUMOR (TURBT);  Surgeon: Alexis Frock, MD;  Location: [facility];  Service: Urology;  Laterality: N/A;  . YAG LASER APPLICATION  64/[id number]/3474   Procedure: YAG LASER APPLICATION;  Surgeon: Elta Guadeloupe T. Gershon Crane, MD;  Location: [facility];  Service: Ophthalmology;  Laterality: Right;    Family History  Problem Relation Age of Onset  . Breast cancer Mother        mets  . Diabetes Mother   . Heart  disease Father        MI  . Colon cancer Brother   . Cancer Sister        male organs  . Diabetes Sister        family hx  . Arthritis Other        entire family  . Diabetes Brother        x 2   Social History  Substance Use Topics  . Smoking status: Former Smoker    Packs/day: 2.00    Years: 47.00    Types: Cigarettes    Quit date: 03/17/2017  . Smokeless tobacco: Never Used     Comment: pt quits smoking periodically :: started smoking again Jan 2018 1ppd  . Alcohol use No    There were no vitals taken for this visit.  Physical Exam  Constitutional: He is oriented to person, place, and time. He appears well-developed and well-nourished.  Vital signs have been reviewed and are stable. Gen. appearance the patient is well-developed and well-nourished with normal grooming and hygiene.   Cardiovascular:  Pulses:      Dorsalis pedis pulses are 2+ on the right side, and 2+ on the left side.       Posterior tibial pulses are 2+ on the right side, and 2+ on the left side.  Musculoskeletal:       Left knee: Medial joint line tenderness noted.  GAIT, he has a slight limp favors  Neurological: He is alert and oriented to person, place, and time. He displays no atrophy. He exhibits normal muscle tone. He displays no seizure activity.  Sensation normal both lower extremities  Skin: Skin is warm and dry. No erythema.  Psychiatric: He has a normal mood and affect.  Vitals reviewed.   Left Knee Exam  Swelling: None Effusion: No  Tenderness  The patient is experiencing tenderness in the medial joint line.  Range of Motion  Extension: -5 Flexion:     120  Muscle Strength  Normal left knee strength  Tests  McMurrays:  Medial - Positive       Lachman:  Anterior - Negative     Drawer:       Anterior - Negative     Posterior - Negative Varus:  Negative Valgus: Negative     Encounter Diagnoses  Name Primary?  . Left knee pain, unspecified chronicity Yes  . Primary  osteoarthritis of left knee   . Derangement of posterior horn of medial meniscus of left knee     3 x-rays were obtained and they show Read the following [facility] lateral and patellofemoral view left knee Alignment 4 valgus Medial joint space joint space narrowing approximately 50% with mild sclerosis subchondral bone of the tibia no osteophytes no cyst formation Lateral joint space normal Patellofemoral joint space normal joint space with lateral spur Effusion none present  Impression moderate OA left knee PLAN:  The patient had one injection in his left knee it did not help him past 2 weeks  Recommend MRI to plan for arthroscopy for medial meniscal tear left knee

## 2017-05-07 ENCOUNTER — Other Ambulatory Visit: Payer: Self-pay | Admitting: Cardiology

## 2017-05-08 ENCOUNTER — Telehealth: Payer: Self-pay | Admitting: Orthopedic Surgery

## 2017-05-08 NOTE — Telephone Encounter (Signed)
MRI AND FOLLOW UP APPT SCHEDULED, PATIENT AWARE

## 2017-05-08 NOTE — Telephone Encounter (Signed)
Patient called, states had been advised at time of office visit on 04/29/17 that Dr Aline Brochure would order MRI?  Please advise, patient's best contact ph# [phone]

## 2017-05-14 ENCOUNTER — Ambulatory Visit (HOSPITAL_COMMUNITY)
Admission: RE | Admit: 2017-05-14 | Discharge: 2017-05-14 | Disposition: A | Discharge status: Home / Self Care | Payer: Medicare HMO | Source: Ambulatory Visit | Attending: Orthopedic Surgery | Admitting: Orthopedic Surgery

## 2017-05-14 DIAGNOSIS — X58XXXA Exposure to other specified factors, initial encounter: Secondary | ICD-10-CM | POA: Diagnosis not present

## 2017-05-14 DIAGNOSIS — M25562 Pain in left knee: Secondary | ICD-10-CM | POA: Diagnosis not present

## 2017-05-14 DIAGNOSIS — S83242A Other tear of medial meniscus, current injury, left knee, initial encounter: Secondary | ICD-10-CM | POA: Insufficient documentation

## 2017-05-14 DIAGNOSIS — M949 Disorder of cartilage, unspecified: Secondary | ICD-10-CM | POA: Diagnosis not present

## 2017-05-23 ENCOUNTER — Telehealth: Payer: Self-pay | Admitting: Orthopedic Surgery

## 2017-05-23 ENCOUNTER — Encounter: Payer: Self-pay | Admitting: Orthopedic Surgery

## 2017-05-23 NOTE — Telephone Encounter (Signed)
Patient called today stating he wanted to cancel his appointment for the 12th.  I asked him if he went for his MRI and he said he did.  I reminded him that the appointment on the 12th was for he and Dr. Aline Brochure to go over the MRI results.  He said he understood but would call and reschedule some time later.

## 2017-05-29 ENCOUNTER — Ambulatory Visit: Payer: Medicare HMO | Admitting: Orthopedic Surgery

## 2017-06-04 NOTE — Telephone Encounter (Signed)
Patient called back today, 06/04/17; referred to the follow up appointment which he had called to cancel. States he needs a copy of his films and report.  Relayed that we can provide, with the appropriate signed authorization form, copy of reports/notes, and provided patient with [facility] radiology contact information if he needs to obtain copy of films.  I offered to re-schedule his MRI follow up appointment with Dr Aline Brochure, and he relayed that he does not need to re-schedule.

## 2017-06-05 ENCOUNTER — Ambulatory Visit (INDEPENDENT_AMBULATORY_CARE_PROVIDER_SITE_OTHER): Payer: Medicare HMO | Admitting: Cardiology

## 2017-06-05 VITALS — BP 174/90 | HR 75 | Ht 69.0 in | Wt 215.0 lb

## 2017-06-05 DIAGNOSIS — I2510 Atherosclerotic heart disease of native coronary artery without angina pectoris: Secondary | ICD-10-CM

## 2017-06-05 DIAGNOSIS — Z01810 Encounter for preprocedural cardiovascular examination: Secondary | ICD-10-CM | POA: Diagnosis not present

## 2017-06-05 DIAGNOSIS — I6523 Occlusion and stenosis of bilateral carotid arteries: Secondary | ICD-10-CM | POA: Diagnosis not present

## 2017-06-05 DIAGNOSIS — R0602 Shortness of breath: Secondary | ICD-10-CM | POA: Diagnosis not present

## 2017-06-05 NOTE — Progress Notes (Signed)
Clinical Summary [name] is a 71 y.o.male seen today for follow up of the following medical problems.   1. HTN - elevated in clinic today, he reports due to ongoing knee pain.  - compliant with meds  2. Carotid stenosis - 40-98% RICA, LICA 1-19%. No history of stroke - has been on ASA and plavix, presume due to his history cerebrovascular disease - followed by vascular. He wants to follow here for serial imaging.   No recent neuro symptoms   3. Hyperlipidemia - he is compliant with statin  4. OSA - compliant with CPAP  5. Prostate CA - history of prior seed implant, reports cancer free  6. CAD - incidental findings form prior chest CT in 2013. Do not see a previous functional assessment for ischemia. The patient has not had symptoms of angina  - no chest pain. No SOB or DOE - highest level of activity is using push mower x30 minutes. Has to stop at times due to knee pains, not SOB.  - overall activity is limited by knee pains.   7. Leg pains - occurs at rest or with activity.  - he has not been interested in ABIs  8. AAA screen - male over 76 former smoker - 02/2014 CT showed no aneurysm  9. Preoperative evaluation - being considered for knee surgery per patient report    Past Medical History:  Diagnosis Date  . Bilateral renal cysts   . Bladder cancer ([place]) UROLOGIST-  DR [facility]   RECURRENT 11/ 2017 --  hx turbt in 2004 and 08-19-2013  . BPH without obstruction/lower urinary tract symptoms   . Cervical fusion syndrome    LEFT ARM NUMBNESS--  CONTROLLED W/ GABAPENTIN  . Coronary artery disease    CARDIOLOGIST-- DR Morrie Daywalt [name])  . DDD (degenerative disc disease), cervical   . DDD (degenerative disc disease), lumbar   . Diverticulosis of sigmoid colon   . History of acute gouty arthritis   . History of adenomatous polyp of colon    2001;  2012- non-malignant leiomyoma;  04-07-2014  tubualr adenoma and hyperplastic polyp's  .  History of prostate cancer UROLOGIST-  DR [facility]   S/P  RADIOACTIVE SEED IMPLANTS 2004  . HOH (hard of hearing)    no hearing aids  . Hyperlipidemia   . Hypertension   . OA (osteoarthritis)   . Occlusion and stenosis of carotid artery without mention of cerebral infarction CARDIOLOGIST  -- DR  Roderic Palau Tariq Pernell   LAST DUPLEX  11-23-2015  RICA  (DUPLEX DOPPLER 01-20-2016 RICA 14-78% PROXIMAL) /   LICA <29% (HAD CONSULT W/ [name] , VASCULAR SURGEON 01-20-2016)  . OSA on CPAP    BiPAP  . Spondylosis, cervical   . Type 2 diabetes mellitus ([place])   . Wears glasses      Allergies  Allergen Reactions  . Dilaudid [Hydromorphone Hcl] Other (See Comments)    "acts a little off"  . Toradol [Ketorolac Tromethamine] Other (See Comments)    CauseD  hallucination ?-states not sure/ avoids per his MD  . Lyrica [Pregabalin] Nausea Only and Other (See Comments)    Make me feel "bad"     Current Outpatient Prescriptions  Medication Sig Dispense Refill  . acetaminophen (TYLENOL) 500 MG tablet Take 1,000 mg by mouth every 6 (six) hours as needed for moderate pain.    Marland Kitchen allopurinol (ZYLOPRIM) 300 MG tablet Take 300 mg by mouth at bedtime.     Marland Kitchen  amLODipine (NORVASC) 10 MG tablet TAKE 1 TABLET BY MOUTH EVERY DAY 90 tablet 3  . aspirin 81 MG EC tablet Take 81 mg by mouth at bedtime.     . celecoxib (CELEBREX) 200 MG capsule Take 200 mg by mouth every morning.    . clindamycin (CLEOCIN) 300 MG capsule Take 1 capsule (300 mg total) by mouth 3 (three) times daily. 15 capsule 0  . clopidogrel (PLAVIX) 75 MG tablet Take 1 tablet by mouth daily.  6  . colchicine 0.6 MG tablet Take 0.6 mg by mouth daily as needed (gout). For gout    . docusate sodium (COLACE) 100 MG capsule Take 200 mg by mouth at bedtime.     . dutasteride (AVODART) 0.5 MG capsule Take 0.5 mg by mouth every morning.     . furosemide (LASIX) 40 MG tablet Take 40 mg by mouth every morning.     . gabapentin (NEURONTIN) 300 MG capsule Take 300 mg  by mouth 3 (three) times daily.      Marland Kitchen HYDROcodone-acetaminophen (NORCO/VICODIN) 5-325 MG tablet Take 1 tablet by mouth every 4 (four) hours as needed. (Patient not taking: Reported on 04/29/2017) 15 tablet 0  . ibuprofen (ADVIL,MOTRIN) 200 MG tablet Take 400-600 mg by mouth every 8 (eight) hours as needed for moderate pain. Reported on 01/20/2016    . insulin detemir (LEVEMIR) 100 UNIT/ML injection Inject 20 Units into the skin at bedtime.     Marland Kitchen labetalol (NORMODYNE) 100 MG tablet Take 200 mg by mouth 2 (two) times daily.     Marland Kitchen losartan (COZAAR) 100 MG tablet TAKE 1 TABLET BY MOUTH DAILY 30 tablet 0  . losartan (COZAAR) 100 MG tablet TAKE 1 TABLET BY MOUTH DAILY 30 tablet 3  . Naproxen Sodium (ALEVE) 220 MG CAPS Take 1 capsule by mouth daily as needed.    Marland Kitchen omega-3 acid ethyl esters (LOVAZA) 1 G capsule Take 1 g by mouth 2 (two) times daily.     . polyethylene glycol powder (GLYCOLAX/MIRALAX) powder TAKE 17MG (1CAPFUL) BY MOUTH(MIXED WITH WATER/JUICE) DAILY (Patient taking differently: TAKE 17MG (1CAPFUL) BY MOUTH(MIXED WITH WATER/JUICE) EVERY EVENING.) 527 g 0  . Potassium Gluconate 595 MG CAPS Take 1 capsule by mouth daily.    . rosuvastatin (CRESTOR) 10 MG tablet Take 10 mg by mouth every morning.     Marland Kitchen tiZANidine (ZANAFLEX) 4 MG tablet Take 4 mg by mouth every 8 (eight) hours as needed for muscle spasms.     No current facility-administered medications for this visit.      Past Surgical History:  Procedure Laterality Date  . ANTERIOR REMOVAL CAGE AND PLATE C3-C7/ CORPECTOMY C7 (FX)/  ALLOGRAFT AND FUSION C3 -- T1/  POSTERIOR DECOMPRESSION BILATERAL LAMINECTOMY C4 -- 6 & PARTIAL C3/  POSTEROLATERAL ARTHRODESIS C3 - T1  06/05/2005  . APPENDECTOMY  1990'S  . CATARACT EXTRACTION W/ INTRAOCULAR LENS  IMPLANT, BILATERAL  2013  . CERVICAL FUSION  05/11/2005   C3  --  C7/  due to post op quadriparesis same day s/p  anterior C 4,5,6, colpectomy, decompression, removal epidural hematoma, foraminnotomy,  cage and plate  . COLONOSCOPY  last one 04-07-2014  . CYSTOSCOPY W/ RETROGRADES Bilateral 08/19/2013   Procedure: CYSTOSCOPY WITH RETROGRADE PYELOGRAM;  Surgeon: Molli Hazard, MD;  [facility];  Service: Urology;  Laterality: Bilateral;  . CYSTOSCOPY W/ RETROGRADES Bilateral 07/18/2016   Procedure: CYSTOSCOPY WITH RETROGRADE PYELOGRAM;  Surgeon: Alexis Frock, MD;  Location: [facility];  Service:  Urology;  Laterality: Bilateral;  . ENDOSCOPIC REPAIR CSF LEAK VIA NASAL PASSAGE  2011  . INTRAOPERATIVE ARTERIOGRAM  CATH LAB  01-05-2009  DR Einar Gip   RICA   ACUTE ANGLE 80-85% STENOSIS  . LUMBAR FUSION  03-26-2012   L3 --  L5  . RADIOACTIVE PROSTATE SEED IMPLANTS  08-19-2003  . SEPTOPLASTY  1998  . TRANSURETHRAL RESECTION OF BLADDER TUMOR N/A 08/19/2013   Procedure: TRANSURETHRAL RESECTION OF BLADDER TUMOR (TURBT);  Surgeon: Molli Hazard, MD;  Location[facility];  Service: Urology;  Laterality: N/A;  . TRANSURETHRAL RESECTION OF BLADDER TUMOR N/A 07/18/2016   Procedure: TRANSURETHRAL RESECTION OF BLADDER TUMOR (TURBT);  Surgeon: Alexis Frock, MD;  Location: [facility];  Service: Urology;  Laterality: N/A;  . YAG LASER APPLICATION  74/08/8785   Procedure: YAG LASER APPLICATION;  Surgeon: Elta Guadeloupe T. Gershon Crane, MD;  Location: [facility];  Service: Ophthalmology;  Laterality: Right;     Allergies  Allergen Reactions  . Dilaudid [Hydromorphone Hcl] Other (See Comments)    "acts a little off"  . Toradol [Ketorolac Tromethamine] Other (See Comments)    CauseD  hallucination ?-states not sure/ avoids per his MD  . Lyrica [Pregabalin] Nausea Only and Other (See Comments)    Make me feel "bad"      Family History  Problem Relation Age of Onset  . Breast cancer Mother        mets  . Diabetes Mother   . Heart disease Father        MI  . Colon cancer Brother   . Cancer Sister        male organs  . Diabetes  Sister        family hx  . Arthritis Other        entire family  . Diabetes Brother        x 2     Social History Mr. Propes reports that he quit smoking about 2 months ago. His smoking use included Cigarettes. He has a 94.00 pack-year smoking history. He has never used smokeless tobacco. Mr. Pfiffner reports that he does not drink alcohol.   Review of Systems CONSTITUTIONAL: No weight loss, fever, chills, weakness or fatigue.  HEENT: Eyes: No visual loss, blurred vision, double vision or yellow sclerae.No hearing loss, sneezing, congestion, runny nose or sore throat.  SKIN: No rash or itching.  CARDIOVASCULAR: per hpi RESPIRATORY:occas SOB with activiites  GASTROINTESTINAL: No anorexia, nausea, vomiting or diarrhea. No abdominal pain or blood.  GENITOURINARY: No burning on urination, no polyuria NEUROLOGICAL: No headache, dizziness, syncope, paralysis, ataxia, numbness or tingling in the extremities. No change in bowel or bladder control.  MUSCULOSKELETAL: +knee pain  LYMPHATICS: No enlarged nodes. No history of splenectomy.  PSYCHIATRIC: No history of depression or anxiety.  ENDOCRINOLOGIC: No reports of sweating, cold or heat intolerance. No polyuria or polydipsia.  Marland Kitchen   Physical Examination Vitals:   06/05/17 0915  BP: (!) 174/90  Pulse: 75  SpO2: 96%   Vitals:   06/05/17 0915  Weight: 215 lb (97.5 kg)  Height: 5\' 9"  (1.753 m)    Gen: resting comfortably, no acute distress HEENT: no scleral icterus, pupils equal round and reactive, no palptable cervical adenopathy,  CV: RRR, no m/r/g, n ojvd Resp: Clear to auscultation bilaterally GI: abdomen is soft, non-tender, non-distended, normal bowel sounds, no hepatosplenomegaly MSK: extremities are warm, no edema.  Skin: warm, no rash Neuro:  no focal deficits Psych: appropriate affect  Diagnostic Studies     Assessment and Plan   1. HTN - elevated clinic, perhaps due to ongoing knee pain. Has been controlled at  prior recent visits - continue to monitor.   2. Carotid stenosis - continue to monitor with serial carotid US, will order repeat study  3. Hyperlipidemia - continue statin  4. CAD - incidental finding on CT scan, he has not had any cardiac symptoms - no recent symptoms  5. Preoperative evaluatoin - patient reports being considered for possible knee surgery - exercise capacity is limited by severe knee pain. Known CAD by prior CT imaging - we will obtain a lexiscan MPI to further risk stratify prior ot surgery.       Arnoldo Lenis, M.D.

## 2017-06-05 NOTE — Patient Instructions (Signed)
Medication Instructions:  Your physician recommends that you continue on your current medications as directed. Please refer to the Current Medication list given to you today.   Labwork: none  Testing/Procedures: Your physician has requested that you have a carotid duplex. This test is an ultrasound of the carotid arteries in your neck. It looks at blood flow through these arteries that supply the brain with blood. Allow one hour for this exam. There are no restrictions or special instructions.  Your physician has requested that you have a lexiscan myoview. For further information please visit [email or web address]. Please follow instruction sheet, as given.    Follow-Up: Your physician recommends that you schedule a follow-up appointment in:  To be determined   Any Other Special Instructions Will Be Listed Below (If Applicable).     If you need a refill on your cardiac medications before your next appointment, please call your pharmacy.

## 2017-06-06 DIAGNOSIS — S83242A Other tear of medial meniscus, current injury, left knee, initial encounter: Secondary | ICD-10-CM | POA: Diagnosis not present

## 2017-06-06 DIAGNOSIS — M25562 Pain in left knee: Secondary | ICD-10-CM | POA: Diagnosis not present

## 2017-06-06 DIAGNOSIS — M94262 Chondromalacia, left knee: Secondary | ICD-10-CM | POA: Diagnosis not present

## 2017-06-07 ENCOUNTER — Encounter (HOSPITAL_BASED_OUTPATIENT_CLINIC_OR_DEPARTMENT_OTHER)
Admission: RE | Admit: 2017-06-07 | Discharge: 2017-06-07 | Disposition: A | Discharge status: Home / Self Care | Payer: Medicare HMO | Source: Ambulatory Visit | Attending: Cardiology | Admitting: Cardiology

## 2017-06-07 ENCOUNTER — Ambulatory Visit (HOSPITAL_COMMUNITY)
Admission: RE | Admit: 2017-06-07 | Discharge: 2017-06-07 | Disposition: A | Discharge status: Home / Self Care | Payer: Medicare HMO | Source: Ambulatory Visit | Attending: Cardiology | Admitting: Cardiology

## 2017-06-07 ENCOUNTER — Encounter (HOSPITAL_COMMUNITY)
Admission: RE | Admit: 2017-06-07 | Discharge: 2017-06-07 | Disposition: A | Discharge status: Home / Self Care | Payer: Medicare HMO | Source: Ambulatory Visit | Attending: Cardiology | Admitting: Cardiology

## 2017-06-07 ENCOUNTER — Encounter (HOSPITAL_COMMUNITY): Payer: Self-pay

## 2017-06-07 DIAGNOSIS — R0602 Shortness of breath: Secondary | ICD-10-CM | POA: Insufficient documentation

## 2017-06-07 DIAGNOSIS — R079 Chest pain, unspecified: Secondary | ICD-10-CM

## 2017-06-07 DIAGNOSIS — I6523 Occlusion and stenosis of bilateral carotid arteries: Secondary | ICD-10-CM | POA: Diagnosis not present

## 2017-06-07 LAB — NM MYOCAR MULTI W/SPECT W/WALL MOTION / EF
CHL CUP RESTING HR STRESS: 65 {beats}/min
LVDIAVOL: 108 mL (ref 62–150)
LVSYSVOL: 39 mL
Peak HR: 84 {beats}/min
RATE: 0.31
SDS: 2
SRS: 1
SSS: 3
TID: 0.98

## 2017-06-07 MED ORDER — SODIUM CHLORIDE 0.9% FLUSH
INTRAVENOUS | Status: AC
  Administered 2017-06-07: 10 mL via INTRAVENOUS
  Filled 2017-06-07: qty 10

## 2017-06-07 MED ORDER — TECHNETIUM TC 99M TETROFOSMIN IV KIT
10.0000 | PACK | Freq: Once | INTRAVENOUS | Status: AC | PRN
  Administered 2017-06-07: 10.9 via INTRAVENOUS

## 2017-06-07 MED ORDER — TECHNETIUM TC 99M TETROFOSMIN IV KIT
30.0000 | PACK | Freq: Once | INTRAVENOUS | Status: AC | PRN
  Administered 2017-06-07: 30 via INTRAVENOUS

## 2017-06-07 MED ORDER — REGADENOSON 0.4 MG/5ML IV SOLN
INTRAVENOUS | Status: AC
  Administered 2017-06-07: 0.4 mg via INTRAVENOUS
  Filled 2017-06-07: qty 5

## 2017-06-27 NOTE — Progress Notes (Signed)
Please place orders in EPIC as patient is being scheduled for a pre-op appointment! Thank you! 

## 2017-07-08 NOTE — Patient Instructions (Addendum)
[name]  07/08/2017   Your procedure is scheduled on: 07-15-17  Report to [facility] Main  Entrance    Report to admitting at 2:00PM   Call this number if you have problems the morning of [phone]   Remember: ONLY 1 PERSON MAY GO WITH YOU TO SHORT STAY TO GET  READY MORNING OF YOUR SURGERY.    Do not eat food After Midnight. You may have clear liquids from midnight until 1030am day of surgery. Nothing by mouth after 1030am!     Take these medicines the morning of surgery with A SIP OF WATER: amlodipine(norvasc), gabapentin, labetalol, rosuvastatin(crestor), dutasteride(avodart), tylenol as needed                                You may not have any metal on your body including hair pins and              piercings  Do not wear jewelry, make-up, lotions, powders or perfumes, deodorant                    Men may shave face and neck.   Do not bring valuables to the hospital. [place].  Contacts, dentures or bridgework may not be worn into surgery.      Patients discharged the day of surgery will not be allowed to drive home.  Name and phone number of your driver:  Special Instructions: N/A              Please read over the following fact sheets you were given: _____________________________________________________________________  CLEAR LIQUID DIET   Foods Allowed                                                                     Foods Excluded  Coffee and tea, regular and decaf                             liquids that you cannot  Plain Jell-O in any flavor                                             see through such as: Fruit ices (not with fruit pulp)                                     milk, soups, orange juice  Iced Popsicles                                    All solid food Carbonated beverages, regular and diet  Cranberry, grape and apple  juices Sports drinks like Gatorade Lightly seasoned clear broth or consume(fat free) Sugar, honey syrup  Sample Menu Breakfast                                Lunch                                     Supper Cranberry juice                    Beef broth                            Chicken broth Jell-O                                     Grape juice                           Apple juice Coffee or tea                        Jell-O                                      Popsicle                                                Coffee or tea                        Coffee or tea  _____________________________________________________________________ How to Manage Your Diabetes Before and After Surgery  Why is it important to control my blood sugar before and after surgery? . Improving blood sugar levels before and after surgery helps healing and can limit problems. . A way of improving blood sugar control is eating a healthy diet by: o  Eating less sugar and carbohydrates o  Increasing activity/exercise o  Talking with your doctor about reaching your blood sugar goals . High blood sugars (greater than 180 mg/dL) can raise your risk of infections and slow your recovery, so you will need to focus on controlling your diabetes during the weeks before surgery. . Make sure that the doctor who takes care of your diabetes knows about your planned surgery including the date and location.  How do I manage my blood sugar before surgery? . Check your blood sugar at least 4 times a day, starting 2 days before surgery, to make sure that the level is not too high or low. o Check your blood sugar the morning of your surgery when you wake up and every 2 hours until you get to the Short Stay unit. . If your blood sugar is less than 70 mg/dL, you will need to treat for low blood sugar: o Do not take insulin. o Treat a low blood sugar (less than 70 mg/dL) with  cup of clear juice (cranberry or apple), 4 glucose  tablets, OR glucose gel. o  Recheck blood sugar in 15 minutes after treatment (to make sure it is greater than 70 mg/dL). If your blood sugar is not greater than 70 mg/dL on recheck, call [phone] for further instructions. . Report your blood sugar to the short stay nurse when you get to Short Stay.  . If you are admitted to the hospital after surgery: o Your blood sugar will be checked by the staff and you will probably be given insulin after surgery (instead of oral diabetes medicines) to make sure you have good blood sugar levels. o The goal for blood sugar control after surgery is 80-180 mg/dL.   WHAT DO I DO ABOUT MY DIABETES MEDICATION?   . THE NIGHT BEFORE SURGERY, take 10 units of  LEVEMIR INSULIN.      . THE MORNING OF SURGERY, take 10 units  units of LEVEMIR INSULIN.   Patient Signature:  Date:   Nurse Signature:  Date:   Reviewed and Endorsed by [facility] Patient Education Committee, August 2015    [facility] - Preparing for Surgery Before surgery, you can play an important role.  Because skin is not sterile, your skin needs to be as free of germs as possible.  You can reduce the number of germs on your skin by washing with CHG (chlorahexidine gluconate) soap before surgery.  CHG is an antiseptic cleaner which kills germs and bonds with the skin to continue killing germs even after washing. Please DO NOT use if you have an allergy to CHG or antibacterial soaps.  If your skin becomes reddened/irritated stop using the CHG and inform your nurse when you arrive at Short Stay. Do not shave (including legs and underarms) for at least 48 hours prior to the first CHG shower.  You may shave your face/neck. Please follow these instructions carefully:  1.  Shower with CHG Soap the night before surgery and the  morning of Surgery.  2.  If you choose to wash your hair, wash your hair first as usual with your  normal  shampoo.  3.  After you shampoo, rinse your hair and body  thoroughly to remove the  shampoo.                           4.  Use CHG as you would any other liquid soap.  You can apply chg directly  to the skin and wash                       Gently with a scrungie or clean washcloth.  5.  Apply the CHG Soap to your body ONLY FROM THE NECK DOWN.   Do not use on face/ open                           Wound or open sores. Avoid contact with eyes, ears mouth and genitals (private parts).                       Wash face,  Genitals (private parts) with your normal soap.             6.  Wash thoroughly, paying special attention to the area where your surgery  will be performed.  7.  Thoroughly rinse your body with warm water from the neck down.  8.  DO NOT shower/wash with your normal soap after using and rinsing off  the CHG Soap.                9.  Pat yourself dry with a clean towel.            10.  Wear clean pajamas.            11.  Place clean sheets on your bed the night of your first shower and do not  sleep with pets. Day of Surgery : Do not apply any lotions/deodorants the morning of surgery.  Please wear clean clothes to the hospital/surgery center.  FAILURE TO FOLLOW THESE INSTRUCTIONS MAY RESULT IN THE CANCELLATION OF YOUR SURGERY PATIENT SIGNATURE_________________________________  NURSE SIGNATURE__________________________________  ________________________________________________________________________   Adam Phenix  An incentive spirometer is a tool that can help keep your lungs clear and active. This tool measures how well you are filling your lungs with each breath. Taking long deep breaths may help reverse or decrease the chance of developing breathing (pulmonary) problems (especially infection) following:  A long period of time when you are unable to move or be active. BEFORE THE PROCEDURE   If the spirometer includes an indicator to show your best effort, your nurse or respiratory therapist will set it to a desired goal.  If  possible, sit up straight or lean slightly forward. Try not to slouch.  Hold the incentive spirometer in an upright position. INSTRUCTIONS FOR USE  1. Sit on the edge of your bed if possible, or sit up as far as you can in bed or on a chair. 2. Hold the incentive spirometer in an upright position. 3. Breathe out normally. 4. Place the mouthpiece in your mouth and seal your lips tightly around it. 5. Breathe in slowly and as deeply as possible, raising the piston or the ball toward the top of the column. 6. Hold your breath for 3-5 seconds or for as long as possible. Allow the piston or ball to fall to the bottom of the column. 7. Remove the mouthpiece from your mouth and breathe out normally. 8. Rest for a few seconds and repeat Steps 1 through 7 at least 10 times every 1-2 hours when you are awake. Take your time and take a few normal breaths between deep breaths. 9. The spirometer may include an indicator to show your best effort. Use the indicator as a goal to work toward during each repetition. 10. After each set of 10 deep breaths, practice coughing to be sure your lungs are clear. If you have an incision (the cut made at the time of surgery), support your incision when coughing by placing a pillow or rolled up towels firmly against it. Once you are able to get out of bed, walk around indoors and cough well. You may stop using the incentive spirometer when instructed by your caregiver.  RISKS AND COMPLICATIONS  Take your time so you do not get dizzy or light-headed.  If you are in pain, you may need to take or ask for pain medication before doing incentive spirometry. It is harder to take a deep breath if you are having pain. AFTER USE  Rest and breathe slowly and easily.  It can be helpful to keep track of a log of your progress. Your caregiver can provide you with a simple table to help with this. If you are using the spirometer at home, follow these instructions: [place]  IF:   You are having difficultly using the spirometer.  You have trouble using the spirometer as  often as instructed.  Your pain medication is not giving enough relief while using the spirometer.  You develop fever of 100.5 F (38.1 C) or higher. SEEK IMMEDIATE MEDICAL CARE IF:   You cough up bloody sputum that had not been present before.  You develop fever of 102 F (38.9 C) or greater.  You develop worsening pain at or near the incision site. MAKE SURE YOU:   Understand these instructions.  Will watch your condition.  Will get help right away if you are not doing well or get worse. Document Released: 01/14/2007 Document Revised: 11/26/2011 Document Reviewed: 03/17/2007 [facility] Patient Information 2014 [place], [place].   ________________________________________________________________________

## 2017-07-08 NOTE — Progress Notes (Signed)
Cardiology clearance Dr Harl Bowie 06-07-17 epic (mentions in [place] test note )  LOV Cardiology Dr Harl Bowie 06-05-17 epic  STRESS TEST 06-07-17 epic (includes clearance)   EKG 02-03-17 epic   US carotid bilateral 06-07-17    CXR 02-03-17 epic

## 2017-07-09 ENCOUNTER — Encounter (HOSPITAL_COMMUNITY): Payer: Self-pay

## 2017-07-09 ENCOUNTER — Encounter (HOSPITAL_COMMUNITY)
Admission: RE | Admit: 2017-07-09 | Discharge: 2017-07-09 | Disposition: A | Discharge status: Home / Self Care | Payer: Medicare HMO | Source: Ambulatory Visit | Attending: Orthopedic Surgery | Admitting: Orthopedic Surgery

## 2017-07-09 DIAGNOSIS — E119 Type 2 diabetes mellitus without complications: Secondary | ICD-10-CM | POA: Insufficient documentation

## 2017-07-09 DIAGNOSIS — Z01812 Encounter for preprocedural laboratory examination: Secondary | ICD-10-CM | POA: Diagnosis not present

## 2017-07-09 LAB — BASIC METABOLIC PANEL
Anion gap: 9 (ref 5–15)
BUN: 18 mg/dL (ref 6–20)
CALCIUM: 9.4 mg/dL (ref 8.9–10.3)
CO2: 26 mmol/L (ref 22–32)
CREATININE: 0.92 mg/dL (ref 0.61–1.24)
Chloride: 107 mmol/L (ref 101–111)
GFR calc non Af Amer: >60 mL/min (ref >60)
Glucose, Bld: 157 mg/dL — ABNORMAL HIGH (ref 65–99)
Potassium: 4 mmol/L (ref 3.5–5.1)
SODIUM: 142 mmol/L (ref 135–145)

## 2017-07-09 LAB — CBC
HEMATOCRIT: 39.4 % (ref 39.0–52.0)
Hemoglobin: 13.4 g/dL (ref 13.0–17.0)
MCH: 31.1 pg (ref 26.0–34.0)
MCHC: 34 g/dL (ref 30.0–36.0)
MCV: 91.4 fL (ref 78.0–100.0)
Platelets: 167 10*3/uL (ref 150–400)
RBC: 4.31 MIL/uL (ref 4.22–5.81)
RDW: 13.5 % (ref 11.5–15.5)
WBC: 6.8 10*3/uL (ref 4.0–10.5)

## 2017-07-09 LAB — ABO/RH: ABO/RH(D): O POS

## 2017-07-09 LAB — GLUCOSE, CAPILLARY: GLUCOSE-CAPILLARY: 162 mg/dL — AB (ref 65–99)

## 2017-07-10 LAB — HEMOGLOBIN A1C
Hgb A1c MFr Bld: 6.9 % — ABNORMAL HIGH (ref 4.8–5.6)
Mean Plasma Glucose: 151 mg/dL

## 2017-07-15 ENCOUNTER — Ambulatory Visit (HOSPITAL_COMMUNITY): Payer: Medicare HMO | Admitting: Certified Registered Nurse Anesthetist

## 2017-07-15 ENCOUNTER — Encounter (HOSPITAL_COMMUNITY): Payer: Self-pay | Admitting: *Deleted

## 2017-07-15 ENCOUNTER — Encounter (HOSPITAL_COMMUNITY)
Admission: RE | Disposition: A | Discharge status: Home / Self Care | Payer: Self-pay | Source: Ambulatory Visit | Attending: Orthopedic Surgery

## 2017-07-15 ENCOUNTER — Ambulatory Visit (HOSPITAL_COMMUNITY)
Admission: RE | Admit: 2017-07-15 | Discharge: 2017-07-15 | Disposition: A | Discharge status: Home / Self Care | Payer: Medicare HMO | Source: Ambulatory Visit | Attending: Orthopedic Surgery | Admitting: Orthopedic Surgery

## 2017-07-15 DIAGNOSIS — Z8551 Personal history of malignant neoplasm of bladder: Secondary | ICD-10-CM | POA: Diagnosis not present

## 2017-07-15 DIAGNOSIS — F17200 Nicotine dependence, unspecified, uncomplicated: Secondary | ICD-10-CM | POA: Diagnosis not present

## 2017-07-15 DIAGNOSIS — Z981 Arthrodesis status: Secondary | ICD-10-CM | POA: Insufficient documentation

## 2017-07-15 DIAGNOSIS — Z888 Allergy status to other drugs, medicaments and biological substances status: Secondary | ICD-10-CM | POA: Insufficient documentation

## 2017-07-15 DIAGNOSIS — N400 Enlarged prostate without lower urinary tract symptoms: Secondary | ICD-10-CM | POA: Diagnosis not present

## 2017-07-15 DIAGNOSIS — S83242A Other tear of medial meniscus, current injury, left knee, initial encounter: Secondary | ICD-10-CM | POA: Diagnosis not present

## 2017-07-15 DIAGNOSIS — Z7902 Long term (current) use of antithrombotics/antiplatelets: Secondary | ICD-10-CM | POA: Diagnosis not present

## 2017-07-15 DIAGNOSIS — Z794 Long term (current) use of insulin: Secondary | ICD-10-CM | POA: Diagnosis not present

## 2017-07-15 DIAGNOSIS — M2242 Chondromalacia patellae, left knee: Secondary | ICD-10-CM | POA: Insufficient documentation

## 2017-07-15 DIAGNOSIS — M94262 Chondromalacia, left knee: Secondary | ICD-10-CM | POA: Diagnosis not present

## 2017-07-15 DIAGNOSIS — S83241A Other tear of medial meniscus, current injury, right knee, initial encounter: Secondary | ICD-10-CM | POA: Diagnosis not present

## 2017-07-15 DIAGNOSIS — Z86010 Personal history of colonic polyps: Secondary | ICD-10-CM | POA: Diagnosis not present

## 2017-07-15 DIAGNOSIS — M109 Gout, unspecified: Secondary | ICD-10-CM | POA: Insufficient documentation

## 2017-07-15 DIAGNOSIS — G4733 Obstructive sleep apnea (adult) (pediatric): Secondary | ICD-10-CM | POA: Diagnosis not present

## 2017-07-15 DIAGNOSIS — Z9889 Other specified postprocedural states: Secondary | ICD-10-CM | POA: Diagnosis not present

## 2017-07-15 DIAGNOSIS — I10 Essential (primary) hypertension: Secondary | ICD-10-CM | POA: Insufficient documentation

## 2017-07-15 DIAGNOSIS — Z7982 Long term (current) use of aspirin: Secondary | ICD-10-CM | POA: Insufficient documentation

## 2017-07-15 DIAGNOSIS — I2510 Atherosclerotic heart disease of native coronary artery without angina pectoris: Secondary | ICD-10-CM | POA: Insufficient documentation

## 2017-07-15 DIAGNOSIS — Z885 Allergy status to narcotic agent status: Secondary | ICD-10-CM | POA: Diagnosis not present

## 2017-07-15 DIAGNOSIS — Z8546 Personal history of malignant neoplasm of prostate: Secondary | ICD-10-CM | POA: Diagnosis not present

## 2017-07-15 DIAGNOSIS — Z9989 Dependence on other enabling machines and devices: Secondary | ICD-10-CM | POA: Insufficient documentation

## 2017-07-15 DIAGNOSIS — E1151 Type 2 diabetes mellitus with diabetic peripheral angiopathy without gangrene: Secondary | ICD-10-CM | POA: Insufficient documentation

## 2017-07-15 DIAGNOSIS — M1712 Unilateral primary osteoarthritis, left knee: Secondary | ICD-10-CM | POA: Insufficient documentation

## 2017-07-15 DIAGNOSIS — Z79899 Other long term (current) drug therapy: Secondary | ICD-10-CM | POA: Diagnosis not present

## 2017-07-15 DIAGNOSIS — M23222 Derangement of posterior horn of medial meniscus due to old tear or injury, left knee: Secondary | ICD-10-CM | POA: Diagnosis present

## 2017-07-15 DIAGNOSIS — E785 Hyperlipidemia, unspecified: Secondary | ICD-10-CM | POA: Insufficient documentation

## 2017-07-15 HISTORY — PX: KNEE ARTHROSCOPY: SHX127

## 2017-07-15 LAB — GLUCOSE, CAPILLARY
GLUCOSE-CAPILLARY: 136 mg/dL — AB (ref 65–99)
Glucose-Capillary: 164 mg/dL — ABNORMAL HIGH (ref 65–99)

## 2017-07-15 LAB — TYPE AND SCREEN
ABO/RH(D): O POS
ANTIBODY SCREEN: NEGATIVE

## 2017-07-15 SURGERY — ARTHROSCOPY, KNEE
Anesthesia: General | Site: Knee | Laterality: Left

## 2017-07-15 MED ORDER — GLYCOPYRROLATE 0.2 MG/ML IV SOSY
PREFILLED_SYRINGE | INTRAVENOUS | Status: DC | PRN
Start: 2017-07-15 — End: 2017-07-15
  Administered 2017-07-15: 0.4 mg via INTRAVENOUS

## 2017-07-15 MED ORDER — CEFAZOLIN SODIUM-DEXTROSE 2-4 GM/100ML-% IV SOLN
INTRAVENOUS | Status: AC
  Filled 2017-07-15: qty 100

## 2017-07-15 MED ORDER — HYDROCODONE-ACETAMINOPHEN 5-325 MG PO TABS: 1.0000 | ORAL_TABLET | Freq: Four times a day (QID) | ORAL | 0 refills | Status: DC | PRN

## 2017-07-15 MED ORDER — METHOCARBAMOL 500 MG PO TABS: 500.0000 mg | ORAL_TABLET | Freq: Four times a day (QID) | ORAL | Status: DC | PRN

## 2017-07-15 MED ORDER — FENTANYL CITRATE (PF) 100 MCG/2ML IJ SOLN
INTRAMUSCULAR | Status: DC | PRN
  Administered 2017-07-15 (×2): 50 ug via INTRAVENOUS

## 2017-07-15 MED ORDER — BUPIVACAINE-EPINEPHRINE 0.25% -1:200000 IJ SOLN
INTRAMUSCULAR | Status: DC | PRN
  Administered 2017-07-15: 30 mL

## 2017-07-15 MED ORDER — LIDOCAINE-EPINEPHRINE 2 %-1:100000 IJ SOLN
INTRAMUSCULAR | Status: AC
  Filled 2017-07-15: qty 1

## 2017-07-15 MED ORDER — GLYCOPYRROLATE 0.2 MG/ML IV SOSY
PREFILLED_SYRINGE | INTRAVENOUS | Status: AC
  Filled 2017-07-15: qty 5

## 2017-07-15 MED ORDER — FENTANYL CITRATE (PF) 100 MCG/2ML IJ SOLN
INTRAMUSCULAR | Status: AC
  Administered 2017-07-15: 50 ug via INTRAVENOUS
  Filled 2017-07-15: qty 2

## 2017-07-15 MED ORDER — CEFAZOLIN SODIUM-DEXTROSE 2-4 GM/100ML-% IV SOLN
2.0000 g | INTRAVENOUS | Status: AC
  Administered 2017-07-15: 2 g via INTRAVENOUS

## 2017-07-15 MED ORDER — HYDROCODONE-ACETAMINOPHEN 5-325 MG PO TABS
ORAL_TABLET | ORAL | Status: AC
  Administered 2017-07-15: 2 via ORAL
  Filled 2017-07-15: qty 2

## 2017-07-15 MED ORDER — CHLORHEXIDINE GLUCONATE 4 % EX LIQD: 60.0000 mL | Freq: Once | CUTANEOUS | Status: DC

## 2017-07-15 MED ORDER — EPHEDRINE 5 MG/ML INJ
INTRAVENOUS | Status: AC
  Filled 2017-07-15: qty 10

## 2017-07-15 MED ORDER — PROPOFOL 10 MG/ML IV BOLUS
INTRAVENOUS | Status: AC
  Filled 2017-07-15: qty 20

## 2017-07-15 MED ORDER — FENTANYL CITRATE (PF) 100 MCG/2ML IJ SOLN
INTRAMUSCULAR | Status: AC
  Filled 2017-07-15: qty 2

## 2017-07-15 MED ORDER — EPHEDRINE SULFATE-NACL 50-0.9 MG/10ML-% IV SOSY
PREFILLED_SYRINGE | INTRAVENOUS | Status: DC | PRN
  Administered 2017-07-15 (×2): 15 mg via INTRAVENOUS
  Administered 2017-07-15: 10 mg via INTRAVENOUS

## 2017-07-15 MED ORDER — ONDANSETRON HCL 4 MG/2ML IJ SOLN: 4.0000 mg | Freq: Once | INTRAMUSCULAR | Status: DC | PRN

## 2017-07-15 MED ORDER — FENTANYL CITRATE (PF) 250 MCG/5ML IJ SOLN
INTRAMUSCULAR | Status: AC
  Filled 2017-07-15: qty 5

## 2017-07-15 MED ORDER — DEXAMETHASONE SODIUM PHOSPHATE 10 MG/ML IJ SOLN
INTRAMUSCULAR | Status: AC
  Filled 2017-07-15: qty 1

## 2017-07-15 MED ORDER — HYDROCODONE-ACETAMINOPHEN 5-325 MG PO TABS
1.0000 | ORAL_TABLET | Freq: Four times a day (QID) | ORAL | Status: DC | PRN
  Administered 2017-07-15: 2 via ORAL

## 2017-07-15 MED ORDER — LIDOCAINE 2% (20 MG/ML) 5 ML SYRINGE
INTRAMUSCULAR | Status: DC | PRN
  Administered 2017-07-15: 100 mg via INTRAVENOUS

## 2017-07-15 MED ORDER — LIDOCAINE 2% (20 MG/ML) 5 ML SYRINGE
INTRAMUSCULAR | Status: AC
  Filled 2017-07-15: qty 5

## 2017-07-15 MED ORDER — ONDANSETRON HCL 4 MG/2ML IJ SOLN
INTRAMUSCULAR | Status: DC | PRN
  Administered 2017-07-15: 4 mg via INTRAVENOUS

## 2017-07-15 MED ORDER — LACTATED RINGERS IV SOLN
INTRAVENOUS | Status: DC
  Administered 2017-07-15 (×3): via INTRAVENOUS

## 2017-07-15 MED ORDER — METHOCARBAMOL 1000 MG/10ML IJ SOLN
500.0000 mg | Freq: Four times a day (QID) | INTRAVENOUS | Status: DC | PRN
  Administered 2017-07-15: 500 mg via INTRAVENOUS
  Filled 2017-07-15: qty 550

## 2017-07-15 MED ORDER — LABETALOL HCL 300 MG PO TABS
300.0000 mg | ORAL_TABLET | Freq: Once | ORAL | Status: AC
  Administered 2017-07-15: 300 mg via ORAL
  Filled 2017-07-15: qty 1

## 2017-07-15 MED ORDER — BUPIVACAINE-EPINEPHRINE 0.25% -1:200000 IJ SOLN
INTRAMUSCULAR | Status: AC
  Filled 2017-07-15: qty 1

## 2017-07-15 MED ORDER — DEXAMETHASONE SODIUM PHOSPHATE 10 MG/ML IJ SOLN
INTRAMUSCULAR | Status: DC | PRN
  Administered 2017-07-15: 10 mg via INTRAVENOUS

## 2017-07-15 MED ORDER — ONDANSETRON HCL 4 MG/2ML IJ SOLN
INTRAMUSCULAR | Status: AC
  Filled 2017-07-15: qty 2

## 2017-07-15 MED ORDER — FENTANYL CITRATE (PF) 100 MCG/2ML IJ SOLN
25.0000 ug | INTRAMUSCULAR | Status: DC | PRN
  Administered 2017-07-15 (×2): 50 ug via INTRAVENOUS

## 2017-07-15 MED ORDER — PROPOFOL 10 MG/ML IV BOLUS
INTRAVENOUS | Status: DC | PRN
  Administered 2017-07-15: 160 mg via INTRAVENOUS

## 2017-07-15 SURGICAL SUPPLY — 30 items
BANDAGE ACE 6X5 VEL STRL LF (GAUZE/BANDAGES/DRESSINGS) ×2 IMPLANT
BLADE CUDA SHAVER 3.5 (BLADE) ×2 IMPLANT
BLADE SAW SGTL 11.0X1.19X90.0M (BLADE) IMPLANT
BLADE SURG SZ11 CARB STEEL (BLADE) IMPLANT
CLOTH BEACON ORANGE TIMEOUT ST (SAFETY) ×2 IMPLANT
COUNTER NEEDLE 20 DBL MAG RED (NEEDLE) ×2 IMPLANT
DRAPE U-SHAPE 47X51 STRL (DRAPES) IMPLANT
DRSG EMULSION OIL 3X3 NADH (GAUZE/BANDAGES/DRESSINGS) ×2 IMPLANT
DRSG PAD ABDOMINAL 8X10 ST (GAUZE/BANDAGES/DRESSINGS) ×2 IMPLANT
DURAPREP 26ML APPLICATOR (WOUND CARE) ×2 IMPLANT
GAUZE SPONGE 4X4 12PLY STRL (GAUZE/BANDAGES/DRESSINGS) ×2 IMPLANT
GAUZE SPONGE 4X4 16PLY XRAY LF (GAUZE/BANDAGES/DRESSINGS) ×2 IMPLANT
GLOVE BIOGEL M 7.0 STRL (GLOVE) IMPLANT
GLOVE BIOGEL PI IND STRL 7.5 (GLOVE) ×1 IMPLANT
GLOVE BIOGEL PI INDICATOR 7.5 (GLOVE) ×1
GLOVE ORTHO TXT STRL SZ7.5 (GLOVE) ×2 IMPLANT
GOWN STRL REUS W/TWL LRG LVL3 (GOWN DISPOSABLE) ×2 IMPLANT
GOWN STRL REUS W/TWL XL LVL3 (GOWN DISPOSABLE) IMPLANT
KIT BASIN OR (CUSTOM PROCEDURE TRAY) ×2 IMPLANT
LEGGING LITHOTOMY PAIR STRL (DRAPES) ×2 IMPLANT
MANIFOLD NEPTUNE II (INSTRUMENTS) ×2 IMPLANT
MARKER PEN SURG W/LABELS BLK (STERILIZATION PRODUCTS) ×2 IMPLANT
PACK ARTHROSCOPY WL (CUSTOM PROCEDURE TRAY) ×2 IMPLANT
PAD MASON LEG HOLDER (PIN) ×2 IMPLANT
PADDING CAST COTTON 6X4 STRL (CAST SUPPLIES) ×2 IMPLANT
SUT ETHILON 4 0 PS 2 18 (SUTURE) ×2 IMPLANT
SYR 30ML LL (SYRINGE) IMPLANT
TOWEL OR 17X26 10 PK STRL BLUE (TOWEL DISPOSABLE) ×2 IMPLANT
TUBING ARTHRO INFLOW-ONLY STRL (TUBING) ×2 IMPLANT
WRAP KNEE MAXI GEL POST OP (GAUZE/BANDAGES/DRESSINGS) ×2 IMPLANT

## 2017-07-15 NOTE — Anesthesia Preprocedure Evaluation (Addendum)
Anesthesia Evaluation  Patient identified by MRN, date of birth, ID band Patient awake    Reviewed: Allergy & Precautions, NPO status , Patient's Chart, lab work & pertinent test results, reviewed documented beta blocker date and time   Airway Mallampati: III  TM Distance: >3 FB Neck ROM: Full    Dental  (+) Dental Advisory Given   Pulmonary sleep apnea , Current Smoker,    breath sounds clear to auscultation       Cardiovascular hypertension, Pt. on medications and Pt. on home beta blockers + CAD and + Peripheral Vascular Disease   Rhythm:Regular Rate:Normal     Neuro/Psych negative neurological ROS     GI/Hepatic negative GI ROS, Neg liver ROS,   Endo/Other  diabetes, Type 2  Renal/GU negative Renal ROS     Musculoskeletal  (+) Arthritis ,   Abdominal   Peds  Hematology negative hematology ROS (+)   Anesthesia Other Findings   Reproductive/Obstetrics                            Lab Results  Component Value Date   WBC 6.8 07/09/2017   HGB 13.4 07/09/2017   HCT 39.4 07/09/2017   MCV 91.4 07/09/2017   PLT 167 07/09/2017   Lab Results  Component Value Date   CREATININE 0.92 07/09/2017   BUN 18 07/09/2017   NA 142 07/09/2017   K 4.0 07/09/2017   CL 107 07/09/2017   CO2 26 07/09/2017    Anesthesia Physical Anesthesia Plan  ASA: III  Anesthesia Plan: General   Post-op Pain Management:    Induction: Intravenous  PONV Risk Score and Plan: 1 and Ondansetron, Dexamethasone and Treatment may vary due to age or medical condition  Airway Management Planned: LMA  Additional Equipment:   Intra-op Plan:   Post-operative Plan: Extubation in OR  Informed Consent: I have reviewed the patients History and Physical, chart, labs and discussed the procedure including the risks, benefits and alternatives for the proposed anesthesia with the patient or authorized representative who has  indicated his/her understanding and acceptance.   Dental advisory given  Plan Discussed with: CRNA  Anesthesia Plan Comments:        Anesthesia Quick Evaluation

## 2017-07-15 NOTE — Anesthesia Procedure Notes (Signed)
Procedure Name: LMA Insertion Date/Time: 07/15/2017 5:01 PM Performed by: Lind Covert Pre-anesthesia Checklist: Patient identified, Emergency Drugs available, Suction available, Patient being monitored and Timeout performed Patient Re-evaluated:Patient Re-evaluated prior to induction Oxygen Delivery Method: Circle system utilized Preoxygenation: Pre-oxygenation with 100% oxygen Induction Type: IV induction LMA: LMA inserted LMA Size: 5.0 Placement Confirmation: positive ETCO2 and breath sounds checked- equal and bilateral Tube secured with: Tape Dental Injury: Teeth and Oropharynx as per pre-operative assessment

## 2017-07-15 NOTE — Transfer of Care (Signed)
Immediate Anesthesia Transfer of Care Note  Patient: [name]  Procedure(s) Performed: ARTHROSCOPY LEFT  KNEE AND DEBRIDEMENT, medial meniscal tear and chondromalaita (Left Knee)  Patient Location: PACU  Anesthesia Type:General  Level of Consciousness: sedated  Airway & Oxygen Therapy: Patient Spontanous Breathing and Patient connected to face mask oxygen  Post-op Assessment: Report given to RN and Post -op Vital signs reviewed and stable  Post vital signs: Reviewed and stable  Last Vitals:  Vitals:   07/15/17 1354  BP: (!) 178/87  Pulse: 65  Resp: 18  Temp: 36.8 C  SpO2: 99%    Last Pain:  Vitals:   07/15/17 1354  TempSrc: Oral      Patients Stated Pain Goal: 3 (22/44/97 5300)  Complications: No apparent anesthesia complications

## 2017-07-15 NOTE — Progress Notes (Signed)
Pt discharged to home after BP 152/75, HR 86.  Pt reminded not to take night time labetalol.(wife aware).

## 2017-07-15 NOTE — Brief Op Note (Signed)
07/15/2017  3:08 PM  PATIENT:  [name]  71 y.o. male  PRE-OPERATIVE DIAGNOSIS:  1. Left knee medial meniscus tear  POST-OPERATIVE DIAGNOSIS:  1. Left knee medial meniscus tear, 2. Grade III-IV chondromalacia medially (MFC), 3. Grade II-III chondromalacia undersurface of patella and trochlea  PROCEDURE:  Procedure(s) with comments: ARTHROSCOPY LEFT  KNEE AND DEBRIDEMENT (Left) - 60 MINS  See dictated op note 1. Left knee partial meniscectomy 2. Medial and patellofemoral chondroplasty, debridement of unstable cartilage, no abrasion or microfracture   SURGEON:  Surgeon(s) and Role:    Paralee Cancel, MD - Primary  PHYSICIAN ASSISTANT: None  ANESTHESIA:   general  EBL:  minimal  BLOOD ADMINISTERED:none  DRAINS: none   LOCAL MEDICATIONS USED:  MARCAINE     SPECIMEN:  No Specimen  DISPOSITION OF SPECIMEN:  N/A  COUNTS:  YES  TOURNIQUET:  * No tourniquets in log *  DICTATION: .Other Dictation: Dictation Number [id number]  PLAN OF CARE: Discharge to home after PACU  PATIENT DISPOSITION:  PACU - hemodynamically stable.   Delay start of Pharmacological VTE agent (>24hrs) due to surgical blood loss or risk of bleeding: no

## 2017-07-15 NOTE — H&P (Signed)
CC- [name] is a 71 y.o. male who presents with left knee pain.  HPI- . Knee Pain: Patient presents for follow up on a knee problem involving the  left knee. Onset of the symptoms was several months ago. Inciting event: none known. Current symptoms include giving out, pain located medially, stiffness and swelling. Pain is aggravated by going up and down stairs, lateral movements, pivoting and squatting.  Patient has had no prior knee problems. Evaluation to date: plain films: normal and MRI: abnormal medial meniscus tear. Treatment to date: avoidance of offending activity, corticosteroid injection which was not very effective and OTC analgesics which are not very effective.  Past Medical History:  Diagnosis Date  . Bilateral renal cysts   . Bladder cancer ([place]) UROLOGIST-  DR [facility]   RECURRENT 11/ 2017 --  hx turbt in 2004 and 08-19-2013  . BPH without obstruction/lower urinary tract symptoms   . Cervical fusion syndrome    LEFT ARM NUMBNESS--  CONTROLLED W/ GABAPENTIN  . Coronary artery disease    CARDIOLOGIST-- DR BRANCH [name])  . DDD (degenerative disc disease), cervical   . DDD (degenerative disc disease), lumbar   . Diverticulosis of sigmoid colon   . History of acute gouty arthritis   . History of adenomatous polyp of colon    2001;  2012- non-malignant leiomyoma;  04-07-2014  tubualr adenoma and hyperplastic polyp's  . History of prostate cancer UROLOGIST-  DR [facility]   S/P  RADIOACTIVE SEED IMPLANTS 2004  . HOH (hard of hearing)    no hearing aids  . Hyperlipidemia   . Hypertension   . OA (osteoarthritis)   . Occlusion and stenosis of carotid artery without mention of cerebral infarction CARDIOLOGIST  -- DR  [name]   LAST DUPLEX  11-23-2015  RICA  (DUPLEX DOPPLER 01-20-2016 RICA 35-57% PROXIMAL) /   LICA <32% (HAD CONSULT W/ [name] , VASCULAR SURGEON 01-20-2016)  . OSA on CPAP    BiPAP  . Spondylosis, cervical   . Type 2 diabetes mellitus ([place])    . Wears glasses     Past Surgical History:  Procedure Laterality Date  . ANTERIOR REMOVAL CAGE AND PLATE C3-C7/ CORPECTOMY C7 (FX)/  ALLOGRAFT AND FUSION C3 -- T1/  POSTERIOR DECOMPRESSION BILATERAL LAMINECTOMY C4 -- 6 & PARTIAL C3/  POSTEROLATERAL ARTHRODESIS C3 - T1  06/05/2005  . APPENDECTOMY  1990'S  . CATARACT EXTRACTION W/ INTRAOCULAR LENS  IMPLANT, BILATERAL  2013  . CERVICAL FUSION  05/11/2005   C3  --  C7/  due to post op quadriparesis same day s/p  anterior C 4,5,6, colpectomy, decompression, removal epidural hematoma, foraminnotomy, cage and plate  . COLONOSCOPY  last one 04-07-2014  . CYSTOSCOPY W/ RETROGRADES Bilateral 08/19/2013   Procedure: CYSTOSCOPY WITH RETROGRADE PYELOGRAM;  Surgeon: Molli Hazard, MD;  Location: [facility];  Service: Urology;  Laterality: Bilateral;  . CYSTOSCOPY W/ RETROGRADES Bilateral 07/18/2016   Procedure: CYSTOSCOPY WITH RETROGRADE PYELOGRAM;  Surgeon: Alexis Frock, MD;  Location: [facility];  Service: Urology;  Laterality: Bilateral;  . ENDOSCOPIC REPAIR CSF LEAK VIA NASAL PASSAGE  2011  . INTRAOPERATIVE ARTERIOGRAM  CATH LAB  01-05-2009  DR Einar Gip   RICA   ACUTE ANGLE 80-85% STENOSIS  . LUMBAR FUSION  03-26-2012   L3 --  L5  . RADIOACTIVE PROSTATE SEED IMPLANTS  08-19-2003  . SEPTOPLASTY  1998  . TRANSURETHRAL RESECTION OF BLADDER TUMOR N/A 08/19/2013   Procedure: TRANSURETHRAL RESECTION  OF BLADDER TUMOR (TURBT);  Surgeon: Molli Hazard, MD;  Location: [facility];  Service: Urology;  Laterality: N/A;  . TRANSURETHRAL RESECTION OF BLADDER TUMOR N/A 07/18/2016   Procedure: TRANSURETHRAL RESECTION OF BLADDER TUMOR (TURBT);  Surgeon: Alexis Frock, MD;  Location: [facility];  Service: Urology;  Laterality: N/A;  . YAG LASER APPLICATION  73/22/0254   Procedure: YAG LASER APPLICATION;  Surgeon: Elta Guadeloupe T. Gershon Crane, MD;  Location: [facility];  Service: Ophthalmology;  Laterality:  Right;    Prior to Admission medications   Medication Sig Start Date End Date Taking? Authorizing Provider  acetaminophen (TYLENOL) 500 MG tablet Take 1,000 mg by mouth every 6 (six) hours as needed for moderate pain.   Yes [provider]  allopurinol (ZYLOPRIM) 300 MG tablet Take 300 mg by mouth at bedtime.    Yes [provider]  amLODipine (NORVASC) 10 MG tablet TAKE 1 TABLET BY MOUTH EVERY DAY 01/31/17  Yes Branch, Alphonse Guild, MD  aspirin 81 MG EC tablet Take 81 mg by mouth at bedtime.    Yes [provider]  celecoxib (CELEBREX) 200 MG capsule Take 200 mg by mouth 2 (two) times daily.    Yes [provider]  clopidogrel (PLAVIX) 75 MG tablet Take 75 mg by mouth daily.  07/11/16  Yes [provider]  colchicine 0.6 MG tablet Take 0.6 mg by mouth 2 (two) times daily as needed (gout). For gout   Yes [provider]  docusate sodium (COLACE) 100 MG capsule Take 200 mg by mouth at bedtime.    Yes [provider]  dutasteride (AVODART) 0.5 MG capsule Take 0.5 mg by mouth every morning.    Yes [provider]  furosemide (LASIX) 40 MG tablet Take 40 mg by mouth every morning.    Yes [provider]  gabapentin (NEURONTIN) 300 MG capsule Take 300 mg by mouth 3 (three) times daily.     Yes [provider]  HYDROcodone-acetaminophen (NORCO/VICODIN) 5-325 MG tablet Take 1 tablet by mouth every 4 (four) hours as needed. Patient taking differently: Take 1 tablet by mouth every 4 (four) hours as needed for moderate pain.  02/03/17  Yes Lily Kocher, PA-C  insulin detemir (LEVEMIR) 100 UNIT/ML injection Inject 20 Units into the skin at bedtime.    Yes [provider]  labetalol (NORMODYNE) 100 MG tablet Take 300 mg by mouth 2 (two) times daily.    Yes [provider]  omega-3 acid ethyl esters (LOVAZA) 1 G capsule Take 1 g by mouth 2 (two) times daily.    Yes [provider]  polyethylene  glycol powder (GLYCOLAX/MIRALAX) powder TAKE 17MG (1CAPFUL) BY MOUTH(MIXED WITH WATER/JUICE) DAILY Patient taking differently: TAKE 17MG (1CAPFUL) BY MOUTH(MIXED WITH WATER/JUICE) EVERY EVENING. 05/02/15  Yes Lafayette Dragon, MD  rosuvastatin (CRESTOR) 10 MG tablet Take 10 mg by mouth every morning.    Yes [provider]  clindamycin (CLEOCIN) 300 MG capsule Take 1 capsule (300 mg total) by mouth 3 (three) times daily. Patient not taking: Reported on 07/02/2017 02/03/17   Lily Kocher, PA-C  losartan (COZAAR) 100 MG tablet TAKE 1 TABLET BY MOUTH DAILY Patient not taking: Reported on 07/02/2017 09/05/16   Arnoldo Lenis, MD  losartan (COZAAR) 100 MG tablet TAKE 1 TABLET BY MOUTH DAILY 05/08/17   Arnoldo Lenis, MD    soft tissue tenderness over medial joint line, effusion, reduced range of motion, collateral ligaments intact, normal ipsilateral hip exam, normal  contralateral knee exam  Physical Examination: General appearance - alert, well appearing, and in no distress Mental status - alert, oriented to person, place, and time Lymphatics - no palpable lymphadenopathy Chest - no tachypnea, retractions or cyanosis Heart - normal rate and regular rhythm Abdomen - soft, nontender, nondistended, no masses or organomegaly Musculoskeletal - see above Extremities - peripheral pulses normal, no pedal edema, no clubbing or cyanosis Skin - normal coloration and turgor, no rashes, no suspicious skin lesions noted   Asessment/Plan--- Left knee medial meniscal tear and degenerative joint disease  Plan left knee arthroscopy with partial medial meniscectomy and chondroplasty. Procedure risks and potential comps discussed with patient who elects to proceed. Goals are decreased pain and increased function with a high likelihood of achieving both

## 2017-07-16 ENCOUNTER — Encounter (HOSPITAL_COMMUNITY): Payer: Self-pay | Admitting: Orthopedic Surgery

## 2017-07-16 NOTE — Anesthesia Postprocedure Evaluation (Signed)
Anesthesia Post Note  Patient: Clem R Medders  Procedure(s) Performed: ARTHROSCOPY LEFT  KNEE AND DEBRIDEMENT, medial meniscal tear and chondromalaita (Left Knee)     Patient location during evaluation: PACU Anesthesia Type: General Level of consciousness: awake and alert Pain management: pain level controlled Vital Signs Assessment: post-procedure vital signs reviewed and stable Respiratory status: spontaneous breathing, nonlabored ventilation, respiratory function stable and patient connected to nasal cannula oxygen Cardiovascular status: blood pressure returned to baseline and stable Postop Assessment: no apparent nausea or vomiting Anesthetic complications: no    Last Vitals:  Vitals:   07/15/17 2000 07/15/17 2002  BP: 137/72 (!) 152/75  Pulse:    Resp:    Temp:    SpO2:      Last Pain:  Vitals:   07/15/17 1812  TempSrc:   PainSc: 10-Worst pain ever                 Tiajuana Amass

## 2017-07-16 NOTE — Op Note (Signed)
[name], [name] NO.:  [id number]  MEDICAL RECORD NO.:  [id number]  LOCATION:                                 FACILITY:  PHYSICIAN:  Pietro Cassis. Alvan Dame, M.D.  DATE OF BIRTH:  1946-08-07  DATE OF PROCEDURE:  07/15/2017 DATE OF DISCHARGE:                              OPERATIVE REPORT   PREOPERATIVE DIAGNOSIS:  Left knee medial meniscal tear associated with degenerative joint changes.  POSTOPERATIVE DIAGNOSES/FINDINGS: 1. Complex tear involving the posterior horn through the midportion of     the medial meniscus. 2. Grade 3 to 4 chondromalacia in the medial compartment of the knee. 3. Grade 2 to 3 chondromalacia in the patellofemoral compartment.  PROCEDURES: 1. Diagnostic left knee arthroscopy with partial medial meniscectomy. 2. Medial and patellofemoral chondroplasty, debridement of     cartilaginous surfaces, no microfracture or abrasion carried out.  SURGEON:  Pietro Cassis. Alvan Dame, M.D.  ASSISTANT:  Surgical team.  ANESTHESIA:  General.  SPECIMENS:  None.  COMPLICATIONS:  None.  BLOOD LOSS:  Minimal.  INDICATIONS FOR PROCEDURE:  [name] is a pleasant 71 year old male who presented to the office for recurring left knee problems.  He had previous radiographic workup that indicated meniscal pathologies.  Plain radiographs indicated some preserved joint spaces.  He had failed conservative measures and wished to proceed with more definitive measures from surgical standpoint.  He is not ready for total knee arthroplasty.  He wished to try conservative treatment including arthroscopic surgery for debridement and mechanical source of symptoms. I reviewed the limitations of arthroscopic surgery in terms of managing arthritis.  Thus, the risks of potential progressive degenerative changes and need for future surgery were discussed and reviewed.  We discussed the possibility of recurrent meniscal pathology and standard risks of infection and DVT was  reviewed.  Consent was obtained for benefit of attempted pain relief.  PROCEDURE IN DETAIL:  The patient was brought to the operative theater. Once adequate anesthesia, preoperative antibiotics, Ancef administered, he was positioned supine.  His left leg was placed in the leg holder to allow for arthroscopic management.  The left lower extremity was then prepped and draped in sterile fashion.  Time-out was performed, identifying the patient, planned procedure, and extremity.  Standard inferior medial, inferior lateral, superior lateral portals were utilized.  Diagnostic evaluation of the knee revealed the above findings.  The inferior medial portal was utilized as a sole diagnostic and working portal.  I used a straight-biting basket to debride the meniscus back to a stable level from the posterior horn to the portion of the medial midbody.  I then inserted 3.5 Cuda shaver and removed meniscal fragments and blended and contoured the remaining meniscus back to a stable level within the junction of the periphery and central portion of the meniscus.  I used the same shaver to debride unstable chondral flaps noted on the medial femoral condyle.  There was noted to be an area of about a cm in diameter of eburnated bone on the medial aspect of the medial femoral condyle.  There was no associated defect in the tibial plateau surface.  Once I had stabilized the medial femoral cartilage surface  as much as possible, I then evaluated the ACL, noted to be intact.  His lateral compartment was intact.  Anteriorly, he was noted to have diffuse grade 2 to 3 chondromalacia. The areas that were unstable, I debrided back to stable level using a 3.5 Cuda shaver.  He was noted to have a fairly hyperemic synovial reaction to his pathology.  Once I had performed the chondroplasty anterior, I re-examined the knee to make certain I was satisfied with the remaining stable levels of articular meniscal  cartilage.  The instrumentation was then removed from the knee.  The portal sites were reapproximated using 4-0 nylon.  The knee was injected at the end of the case with 30 mL of 0.25% Marcaine with epinephrine.  The knee was then wrapped into a sterile bulky Jones dressing.  He was then brought to the recovery room in stable condition, tolerating the procedure well.  Findings were reviewed with the family.  He will be weightbearing as tolerated.  Crutches as needed, icing the knee 20 to 30 minutes an hour for the first 3 days, and medications as noted.  Resume Plavix for his general medical health and also DVT prophylaxis.     Pietro Cassis Alvan Dame, M.D.     MDO/MEDQ  D:  07/15/2017  T:  07/16/2017  Job:  [id number]

## 2017-07-29 DIAGNOSIS — M25562 Pain in left knee: Secondary | ICD-10-CM | POA: Diagnosis not present

## 2017-08-07 DIAGNOSIS — I10 Essential (primary) hypertension: Secondary | ICD-10-CM | POA: Diagnosis not present

## 2017-08-07 DIAGNOSIS — E1140 Type 2 diabetes mellitus with diabetic neuropathy, unspecified: Secondary | ICD-10-CM | POA: Diagnosis not present

## 2017-08-07 DIAGNOSIS — E782 Mixed hyperlipidemia: Secondary | ICD-10-CM | POA: Diagnosis not present

## 2017-08-09 DIAGNOSIS — I10 Essential (primary) hypertension: Secondary | ICD-10-CM | POA: Diagnosis not present

## 2017-08-09 DIAGNOSIS — E1140 Type 2 diabetes mellitus with diabetic neuropathy, unspecified: Secondary | ICD-10-CM | POA: Diagnosis not present

## 2017-08-09 DIAGNOSIS — M109 Gout, unspecified: Secondary | ICD-10-CM | POA: Diagnosis not present

## 2017-08-09 DIAGNOSIS — Z87891 Personal history of nicotine dependence: Secondary | ICD-10-CM | POA: Diagnosis not present

## 2017-08-09 DIAGNOSIS — B353 Tinea pedis: Secondary | ICD-10-CM | POA: Diagnosis not present

## 2017-08-09 DIAGNOSIS — Z0000 Encounter for general adult medical examination without abnormal findings: Secondary | ICD-10-CM | POA: Diagnosis not present

## 2017-08-09 DIAGNOSIS — M1990 Unspecified osteoarthritis, unspecified site: Secondary | ICD-10-CM | POA: Diagnosis not present

## 2017-08-09 DIAGNOSIS — E782 Mixed hyperlipidemia: Secondary | ICD-10-CM | POA: Diagnosis not present

## 2017-08-11 DIAGNOSIS — R69 Illness, unspecified: Secondary | ICD-10-CM | POA: Diagnosis not present

## 2017-09-03 DIAGNOSIS — L570 Actinic keratosis: Secondary | ICD-10-CM | POA: Diagnosis not present

## 2017-09-03 DIAGNOSIS — D225 Melanocytic nevi of trunk: Secondary | ICD-10-CM | POA: Diagnosis not present

## 2017-09-03 DIAGNOSIS — B078 Other viral warts: Secondary | ICD-10-CM | POA: Diagnosis not present

## 2017-09-03 DIAGNOSIS — X32XXXD Exposure to sunlight, subsequent encounter: Secondary | ICD-10-CM | POA: Diagnosis not present

## 2017-09-18 ENCOUNTER — Other Ambulatory Visit: Payer: Self-pay | Admitting: Cardiology

## 2017-09-19 DIAGNOSIS — H65199 Other acute nonsuppurative otitis media, unspecified ear: Secondary | ICD-10-CM | POA: Diagnosis not present

## 2017-09-19 DIAGNOSIS — Z6832 Body mass index (BMI) 32.0-32.9, adult: Secondary | ICD-10-CM | POA: Diagnosis not present

## 2017-09-19 DIAGNOSIS — R05 Cough: Secondary | ICD-10-CM | POA: Diagnosis not present

## 2017-10-14 ENCOUNTER — Other Ambulatory Visit: Payer: Self-pay | Admitting: Cardiology

## 2017-10-15 DIAGNOSIS — N401 Enlarged prostate with lower urinary tract symptoms: Secondary | ICD-10-CM | POA: Diagnosis not present

## 2017-10-15 DIAGNOSIS — C670 Malignant neoplasm of trigone of bladder: Secondary | ICD-10-CM | POA: Diagnosis not present

## 2017-10-21 DIAGNOSIS — M94262 Chondromalacia, left knee: Secondary | ICD-10-CM | POA: Diagnosis not present

## 2017-10-21 DIAGNOSIS — M1712 Unilateral primary osteoarthritis, left knee: Secondary | ICD-10-CM | POA: Diagnosis not present

## 2017-10-21 DIAGNOSIS — M25562 Pain in left knee: Secondary | ICD-10-CM | POA: Diagnosis not present

## 2017-10-28 DIAGNOSIS — M94262 Chondromalacia, left knee: Secondary | ICD-10-CM | POA: Diagnosis not present

## 2017-10-28 DIAGNOSIS — M25562 Pain in left knee: Secondary | ICD-10-CM | POA: Diagnosis not present

## 2017-11-04 DIAGNOSIS — M94262 Chondromalacia, left knee: Secondary | ICD-10-CM | POA: Diagnosis not present

## 2017-11-04 DIAGNOSIS — M1712 Unilateral primary osteoarthritis, left knee: Secondary | ICD-10-CM | POA: Diagnosis not present

## 2017-11-04 DIAGNOSIS — M25562 Pain in left knee: Secondary | ICD-10-CM | POA: Diagnosis not present

## 2017-12-02 DIAGNOSIS — I10 Essential (primary) hypertension: Secondary | ICD-10-CM | POA: Diagnosis not present

## 2017-12-02 DIAGNOSIS — E1140 Type 2 diabetes mellitus with diabetic neuropathy, unspecified: Secondary | ICD-10-CM | POA: Diagnosis not present

## 2017-12-02 DIAGNOSIS — E782 Mixed hyperlipidemia: Secondary | ICD-10-CM | POA: Diagnosis not present

## 2017-12-04 DIAGNOSIS — R69 Illness, unspecified: Secondary | ICD-10-CM | POA: Diagnosis not present

## 2017-12-04 DIAGNOSIS — I10 Essential (primary) hypertension: Secondary | ICD-10-CM | POA: Diagnosis not present

## 2017-12-04 DIAGNOSIS — E1140 Type 2 diabetes mellitus with diabetic neuropathy, unspecified: Secondary | ICD-10-CM | POA: Diagnosis not present

## 2017-12-04 DIAGNOSIS — M109 Gout, unspecified: Secondary | ICD-10-CM | POA: Diagnosis not present

## 2017-12-04 DIAGNOSIS — Z6831 Body mass index (BMI) 31.0-31.9, adult: Secondary | ICD-10-CM | POA: Diagnosis not present

## 2017-12-04 DIAGNOSIS — E782 Mixed hyperlipidemia: Secondary | ICD-10-CM | POA: Diagnosis not present

## 2017-12-04 DIAGNOSIS — M1380 Other specified arthritis, unspecified site: Secondary | ICD-10-CM | POA: Diagnosis not present

## 2017-12-20 DIAGNOSIS — M94262 Chondromalacia, left knee: Secondary | ICD-10-CM | POA: Diagnosis not present

## 2017-12-20 DIAGNOSIS — M25562 Pain in left knee: Secondary | ICD-10-CM | POA: Diagnosis not present

## 2018-01-16 DIAGNOSIS — C61 Malignant neoplasm of prostate: Secondary | ICD-10-CM | POA: Diagnosis not present

## 2018-01-16 DIAGNOSIS — C670 Malignant neoplasm of trigone of bladder: Secondary | ICD-10-CM | POA: Diagnosis not present

## 2018-01-17 DIAGNOSIS — M1712 Unilateral primary osteoarthritis, left knee: Secondary | ICD-10-CM | POA: Diagnosis not present

## 2018-01-17 DIAGNOSIS — Z9889 Other specified postprocedural states: Secondary | ICD-10-CM | POA: Diagnosis not present

## 2018-01-17 DIAGNOSIS — M94262 Chondromalacia, left knee: Secondary | ICD-10-CM | POA: Diagnosis not present

## 2018-01-29 ENCOUNTER — Telehealth: Payer: Self-pay | Admitting: *Deleted

## 2018-01-29 ENCOUNTER — Other Ambulatory Visit: Payer: Self-pay | Admitting: Cardiology

## 2018-01-29 NOTE — Telephone Encounter (Signed)
Wife informed

## 2018-01-29 NOTE — Telephone Encounter (Signed)
-----   Message from Arnoldo Lenis, MD sent at 01/29/2018  4:22 PM EDT ----- F/u 2 months  Zandra Abts MD ----- Message ----- From: Merlene Laughter, LPN Sent: 9/44/9675   1:19 PM To: Arnoldo Lenis, MD  Did you ever see this? Does he need follow up with you?

## 2018-02-12 DIAGNOSIS — M1712 Unilateral primary osteoarthritis, left knee: Secondary | ICD-10-CM | POA: Diagnosis not present

## 2018-02-12 DIAGNOSIS — I10 Essential (primary) hypertension: Secondary | ICD-10-CM | POA: Diagnosis not present

## 2018-02-12 DIAGNOSIS — E1140 Type 2 diabetes mellitus with diabetic neuropathy, unspecified: Secondary | ICD-10-CM | POA: Diagnosis not present

## 2018-02-12 DIAGNOSIS — M1380 Other specified arthritis, unspecified site: Secondary | ICD-10-CM | POA: Diagnosis not present

## 2018-02-12 DIAGNOSIS — Z87891 Personal history of nicotine dependence: Secondary | ICD-10-CM | POA: Diagnosis not present

## 2018-02-12 DIAGNOSIS — M1990 Unspecified osteoarthritis, unspecified site: Secondary | ICD-10-CM | POA: Diagnosis not present

## 2018-02-12 DIAGNOSIS — E782 Mixed hyperlipidemia: Secondary | ICD-10-CM | POA: Diagnosis not present

## 2018-02-12 DIAGNOSIS — M109 Gout, unspecified: Secondary | ICD-10-CM | POA: Diagnosis not present

## 2018-02-12 DIAGNOSIS — R69 Illness, unspecified: Secondary | ICD-10-CM | POA: Diagnosis not present

## 2018-02-12 DIAGNOSIS — Z6831 Body mass index (BMI) 31.0-31.9, adult: Secondary | ICD-10-CM | POA: Diagnosis not present

## 2018-02-17 ENCOUNTER — Encounter: Payer: Self-pay | Admitting: Cardiology

## 2018-02-17 ENCOUNTER — Other Ambulatory Visit (HOSPITAL_COMMUNITY): Payer: Self-pay | Admitting: Emergency Medicine

## 2018-02-17 ENCOUNTER — Ambulatory Visit: Payer: Medicare HMO | Admitting: Cardiology

## 2018-02-17 ENCOUNTER — Encounter: Payer: Self-pay | Admitting: *Deleted

## 2018-02-17 VITALS — BP 182/80 | HR 74 | Ht 69.0 in | Wt 213.0 lb

## 2018-02-17 DIAGNOSIS — E782 Mixed hyperlipidemia: Secondary | ICD-10-CM

## 2018-02-17 DIAGNOSIS — I2510 Atherosclerotic heart disease of native coronary artery without angina pectoris: Secondary | ICD-10-CM

## 2018-02-17 DIAGNOSIS — I6523 Occlusion and stenosis of bilateral carotid arteries: Secondary | ICD-10-CM

## 2018-02-17 DIAGNOSIS — I10 Essential (primary) hypertension: Secondary | ICD-10-CM | POA: Diagnosis not present

## 2018-02-17 DIAGNOSIS — Z01810 Encounter for preprocedural cardiovascular examination: Secondary | ICD-10-CM | POA: Diagnosis not present

## 2018-02-17 NOTE — Patient Instructions (Signed)
DEANGLEO PASSAGE  02/17/2018   Your procedure is scheduled on: 02-25-18   Report to [facility] Main  Entrance    Report to admitting at 10:00AM    Call this number if you have problems the morning of surgery [phone]     Remember: Do not eat food or drink liquids :After Midnight.    PLEASE BRING CPAP MASK AND TUBING ONLY    Take these medicines the morning of surgery with A SIP OF WATER: AMLODIPINE, LABETALOL, GABAPENTIN, ROSUVASTATIN, COLCHICINE IF NEEDED                                You may not have any metal on your body including hair pins and              piercings  Do not wear jewelry, make-up, lotions, powders or perfumes, deodorant                  Men may shave face and neck.   Do not bring valuables to the hospital. [place].  Contacts, dentures or bridgework may not be worn into surgery.  Leave suitcase in the car. After surgery it may be brought to your room.                Please read over the following fact sheets you were given: _____________________________________________________________________             How to Manage Your Diabetes Before and After Surgery  Why is it important to control my blood sugar before and after surgery? . Improving blood sugar levels before and after surgery helps healing and can limit problems. . A way of improving blood sugar control is eating a healthy diet by: o  Eating less sugar and carbohydrates o  Increasing activity/exercise o  Talking with your doctor about reaching your blood sugar goals . High blood sugars (greater than 180 mg/dL) can raise your risk of infections and slow your recovery, so you will need to focus on controlling your diabetes during the weeks before surgery. . Make sure that the doctor who takes care of your diabetes knows about your planned surgery including the date and location.  How do I manage my blood sugar before  surgery? . Check your blood sugar at least 4 times a day, starting 2 days before surgery, to make sure that the level is not too high or low. o Check your blood sugar the morning of your surgery when you wake up and every 2 hours until you get to the Short Stay unit. . If your blood sugar is less than 70 mg/dL, you will need to treat for low blood sugar: o Do not take insulin. o Treat a low blood sugar (less than 70 mg/dL) with  cup of clear juice (cranberry or apple), 4 glucose tablets, OR glucose gel. o Recheck blood sugar in 15 minutes after treatment (to make sure it is greater than 70 mg/dL). If your blood sugar is not greater than 70 mg/dL on recheck, call [phone] for further instructions. . Report your blood sugar to the short stay nurse when you get to Short Stay.  . If you are admitted to the hospital after surgery: o Your  blood sugar will be checked by the staff and you will probably be given insulin after surgery (instead of oral diabetes medicines) to make sure you have good blood sugar levels. o The goal for blood sugar control after surgery is 80-180 mg/dL.   WHAT DO I DO ABOUT MY DIABETES MEDICATION?   . THE NIGHT BEFORE SURGERY, take  12   units of   LEVEMIR     insulin.         Patient Signature:  Date:   Nurse Signature:  Date:   Reviewed and Endorsed by [facility] Patient Education Committee, August 2015   [facility] - Preparing for Surgery Before surgery, you can play an important role.  Because skin is not sterile, your skin needs to be as free of germs as possible.  You can reduce the number of germs on your skin by washing with CHG (chlorahexidine gluconate) soap before surgery.  CHG is an antiseptic cleaner which kills germs and bonds with the skin to continue killing germs even after washing. Please DO NOT use if you have an allergy to CHG or antibacterial soaps.  If your skin becomes reddened/irritated stop using the CHG and inform your nurse when you  arrive at Short Stay. Do not shave (including legs and underarms) for at least 48 hours prior to the first CHG shower.  You may shave your face/neck. Please follow these instructions carefully:  1.  Shower with CHG Soap the night before surgery and the  morning of Surgery.  2.  If you choose to wash your hair, wash your hair first as usual with your  normal  shampoo.  3.  After you shampoo, rinse your hair and body thoroughly to remove the  shampoo.                           4.  Use CHG as you would any other liquid soap.  You can apply chg directly  to the skin and wash                       Gently with a scrungie or clean washcloth.  5.  Apply the CHG Soap to your body ONLY FROM THE NECK DOWN.   Do not use on face/ open                           Wound or open sores. Avoid contact with eyes, ears mouth and genitals (private parts).                       Wash face,  Genitals (private parts) with your normal soap.             6.  Wash thoroughly, paying special attention to the area where your surgery  will be performed.  7.  Thoroughly rinse your body with warm water from the neck down.  8.  DO NOT shower/wash with your normal soap after using and rinsing off  the CHG Soap.                9.  Pat yourself dry with a clean towel.            10.  Wear clean pajamas.            11.  Place clean sheets on your bed the night of your first shower  and do not  sleep with pets. Day of Surgery : Do not apply any lotions/deodorants the morning of surgery.  Please wear clean clothes to the hospital/surgery center.  FAILURE TO FOLLOW THESE INSTRUCTIONS MAY RESULT IN THE CANCELLATION OF YOUR SURGERY PATIENT SIGNATURE_________________________________  NURSE SIGNATURE__________________________________  ________________________________________________________________________   [name]  An incentive spirometer is a tool that can help keep your lungs clear and active. This tool measures how  well you are filling your lungs with each breath. Taking long deep breaths may help reverse or decrease the chance of developing breathing (pulmonary) problems (especially infection) following:  A long period of time when you are unable to move or be active. BEFORE THE PROCEDURE   If the spirometer includes an indicator to show your best effort, your nurse or respiratory therapist will set it to a desired goal.  If possible, sit up straight or lean slightly forward. Try not to slouch.  Hold the incentive spirometer in an upright position. INSTRUCTIONS FOR USE  1. Sit on the edge of your bed if possible, or sit up as far as you can in bed or on a chair. 2. Hold the incentive spirometer in an upright position. 3. Breathe out normally. 4. Place the mouthpiece in your mouth and seal your lips tightly around it. 5. Breathe in slowly and as deeply as possible, raising the piston or the ball toward the top of the column. 6. Hold your breath for 3-5 seconds or for as long as possible. Allow the piston or ball to fall to the bottom of the column. 7. Remove the mouthpiece from your mouth and breathe out normally. 8. Rest for a few seconds and repeat Steps 1 through 7 at least 10 times every 1-2 hours when you are awake. Take your time and take a few normal breaths between deep breaths. 9. The spirometer may include an indicator to show your best effort. Use the indicator as a goal to work toward during each repetition. 10. After each set of 10 deep breaths, practice coughing to be sure your lungs are clear. If you have an incision (the cut made at the time of surgery), support your incision when coughing by placing a pillow or rolled up towels firmly against it. Once you are able to get out of bed, walk around indoors and cough well. You may stop using the incentive spirometer when instructed by your caregiver.  RISKS AND COMPLICATIONS  Take your time so you do not get dizzy or light-headed.  If you  are in pain, you may need to take or ask for pain medication before doing incentive spirometry. It is harder to take a deep breath if you are having pain. AFTER USE  Rest and breathe slowly and easily.  It can be helpful to keep track of a log of your progress. Your caregiver can provide you with a simple table to help with this. If you are using the spirometer at home, follow these instructions: [place] IF:   You are having difficultly using the spirometer.  You have trouble using the spirometer as often as instructed.  Your pain medication is not giving enough relief while using the spirometer.  You develop fever of 100.5 F (38.1 C) or higher. SEEK IMMEDIATE MEDICAL CARE IF:   You cough up bloody sputum that had not been present before.  You develop fever of 102 F (38.9 C) or greater.  You develop worsening pain at or near the incision site. MAKE  SURE YOU:   Understand these instructions.  Will watch your condition.  Will get help right away if you are not doing well or get worse. Document Released: 01/14/2007 Document Revised: 11/26/2011 Document Reviewed: 03/17/2007 [employer] Patient Information 2014 [employer], [place].   ________________________________________________________________________  WHAT IS A BLOOD TRANSFUSION? Blood Transfusion Information  A transfusion is the replacement of blood or some of its parts. Blood is made up of multiple cells which provide different functions.  Red blood cells carry oxygen and are used for blood loss replacement.  White blood cells fight against infection.  Platelets control bleeding.  Plasma helps clot blood.  Other blood products are available for specialized needs, such as hemophilia or other clotting disorders. BEFORE THE TRANSFUSION  Who gives blood for transfusions?   Healthy volunteers who are fully evaluated to make sure their blood is safe. This is blood bank blood. Transfusion therapy is the safest  it has ever been in the practice of medicine. Before blood is taken from a donor, a complete history is taken to make sure that person has no history of diseases nor engages in risky social behavior (examples are intravenous drug use or sexual activity with multiple partners). The donor's travel history is screened to minimize risk of transmitting infections, such as malaria. The donated blood is tested for signs of infectious diseases, such as HIV and hepatitis. The blood is then tested to be sure it is compatible with you in order to minimize the chance of a transfusion reaction. If you or a relative donates blood, this is often done in anticipation of surgery and is not appropriate for emergency situations. It takes many days to process the donated blood. RISKS AND COMPLICATIONS Although transfusion therapy is very safe and saves many lives, the main dangers of transfusion include:   Getting an infectious disease.  Developing a transfusion reaction. This is an allergic reaction to something in the blood you were given. Every precaution is taken to prevent this. The decision to have a blood transfusion has been considered carefully by your caregiver before blood is given. Blood is not given unless the benefits outweigh the risks. AFTER THE TRANSFUSION  Right after receiving a blood transfusion, you will usually feel much better and more energetic. This is especially true if your red blood cells have gotten low (anemic). The transfusion raises the level of the red blood cells which carry oxygen, and this usually causes an energy increase.  The nurse administering the transfusion will monitor you carefully for complications. HOME CARE INSTRUCTIONS  No special instructions are needed after a transfusion. You may find your energy is better. Speak with your caregiver about any limitations on activity for underlying diseases you may have. SEEK MEDICAL CARE IF:   Your condition is not improving after  your transfusion.  You develop redness or irritation at the intravenous (IV) site. SEEK IMMEDIATE MEDICAL CARE IF:  Any of the following symptoms occur over the next 12 hours:  Shaking chills.  You have a temperature by mouth above 102 F (38.9 C), not controlled by medicine.  Chest, back, or muscle pain.  People around you feel you are not acting correctly or are confused.  Shortness of breath or difficulty breathing.  Dizziness and fainting.  You get a rash or develop hives.  You have a decrease in urine output.  Your urine turns a dark color or changes to pink, red, or brown. Any of the following symptoms occur over the next 10 days:  You have a temperature by mouth above 102 F (38.9 C), not controlled by medicine.  Shortness of breath.  Weakness after normal activity.  The white part of the eye turns yellow (jaundice).  You have a decrease in the amount of urine or are urinating less often.  Your urine turns a dark color or changes to pink, red, or brown. Document Released: 08/31/2000 Document Revised: 11/26/2011 Document Reviewed: 04/19/2008 [facility] Patient Information 2014 [place], [place].  _______________________________________________________________________

## 2018-02-17 NOTE — H&P (Signed)
TOTAL KNEE ADMISSION H&P  Patient is being admitted for left total knee arthroplasty.  Subjective:  Chief Complaint:     Left knee primary OA / pain  HPI: [name], 72 y.o. male, has a history of pain and functional disability in the left knee due to arthritis and has failed non-surgical conservative treatments for greater than 12 weeks to includeNSAID's and/or analgesics, corticosteriod injections, viscosupplementation injections and activity modification.  Onset of symptoms was gradual, starting ~1 years ago with gradually worsening course since that time. The patient noted prior procedures on the knee to include  arthroscopy on the left knee(s).  Patient currently rates pain in the left knee(s) at 9 out of 10 with activity. Patient has night pain, worsening of pain with activity and weight bearing, pain that interferes with activities of daily living, pain with passive range of motion, crepitus and joint swelling.  Patient has evidence of periarticular osteophytes and joint space narrowing by imaging studies. There is no active infection.  Risks, benefits and expectations were discussed with the patient.  Risks including but not limited to the risk of anesthesia, blood clots, nerve damage, blood vessel damage, failure of the prosthesis, infection and up to and including death.  Patient understand the risks, benefits and expectations and wishes to proceed with surgery.    PCP: [name], MD  D/C Plans:       Home  Post-op Meds:       No Rx given   Tranexamic Acid:      To be given - IV   Decadron:      Is to be given  FYI:     Plavix & ASA  Norco  CPAP  Fentanyl ok    DME:   Pt already has equipment   PT:   HHPT   Patient Active Problem List   Diagnosis Date Noted  . Fever 10/09/2016  . Community acquired pneumonia of left lower lobe of lung ([place]) 10/09/2016  . Gram negative sepsis ([place]) 02/05/2014  . UTI (urinary tract infection) 02/04/2014  . Sepsis ([place]) 02/04/2014   . DM type 2 (diabetes mellitus, type 2) ([place]) 02/04/2014  . Tobacco abuse 02/04/2014  . Preop cardiovascular exam 08/05/2013  . CAD (coronary artery disease) 08/05/2012  . Degenerative lumbar spinal stenosis 04/03/2012  . Chest pain 04/03/2012  . Anemia 04/03/2012  . Smoking 06/13/2011  . JOINT EFFUSION, KNEE 01/11/2010  . ARTHRITIS, RIGHT KNEE 12/12/2009  . PAIN IN JOINT, LOWER LEG 12/12/2009  . PROSTATE CANCER 11/08/2009  . GOUTY ARTHRITIS, CHRONIC 11/08/2009  . Overweight 11/08/2009  . HYPERTENSION, BENIGN 11/08/2009  . Carotid artery disease without cerebral infarction ([place]) 11/08/2009  . Malignant neoplasm of bladder ([place]) 03/22/2008  . Hyperlipidemia 03/22/2008  . PAD (peripheral artery disease) ([place]) 03/22/2008  . CONSTIPATION, CHRONIC 03/22/2008  . DEGENERATIVE JOINT DISEASE 03/22/2008  . OSA (obstructive sleep apnea) 03/22/2008  . PROSTATE CANCER, HX OF 03/22/2008  . DIVERTICULITIS, HX OF 03/22/2008   Past Medical History:  Diagnosis Date  . Bilateral renal cysts   . Bladder cancer ([place]) UROLOGIST-  DR [facility]   RECURRENT 11/ 2017 --  hx turbt in 2004 and 08-19-2013  . BPH without obstruction/lower urinary tract symptoms   . Cervical fusion syndrome    LEFT ARM NUMBNESS--  CONTROLLED W/ GABAPENTIN  . Coronary artery disease    CARDIOLOGIST-- DR BRANCH [name])  . DDD (degenerative disc disease), cervical   . DDD (degenerative disc disease), lumbar   .  Diverticulosis of sigmoid colon   . History of acute gouty arthritis   . History of adenomatous polyp of colon    2001;  2012- non-malignant leiomyoma;  04-07-2014  tubualr adenoma and hyperplastic polyp's  . History of prostate cancer UROLOGIST-  DR [facility]   S/P  RADIOACTIVE SEED IMPLANTS 2004  . HOH (hard of hearing)    no hearing aids  . Hyperlipidemia   . Hypertension   . OA (osteoarthritis)   . Occlusion and stenosis of carotid artery without mention of cerebral infarction CARDIOLOGIST  -- DR   [name]   LAST DUPLEX  11-23-2015  RICA  (DUPLEX DOPPLER 01-20-2016 RICA 00-86% PROXIMAL) /   LICA <76% (HAD CONSULT W/ [name] , VASCULAR SURGEON 01-20-2016)  . OSA on CPAP    BiPAP  . Spondylosis, cervical   . Type 2 diabetes mellitus ([place])   . Wears glasses     Past Surgical History:  Procedure Laterality Date  . ANTERIOR REMOVAL CAGE AND PLATE C3-C7/ CORPECTOMY C7 (FX)/  ALLOGRAFT AND FUSION C3 -- T1/  POSTERIOR DECOMPRESSION BILATERAL LAMINECTOMY C4 -- 6 & PARTIAL C3/  POSTEROLATERAL ARTHRODESIS C3 - T1  06/05/2005  . APPENDECTOMY  1990'S  . CATARACT EXTRACTION W/ INTRAOCULAR LENS  IMPLANT, BILATERAL  2013  . CERVICAL FUSION  05/11/2005   C3  --  C7/  due to post op quadriparesis same day s/p  anterior C 4,5,6, colpectomy, decompression, removal epidural hematoma, foraminnotomy, cage and plate  . COLONOSCOPY  last one 04-07-2014  . CYSTOSCOPY W/ RETROGRADES Bilateral 08/19/2013   Procedure: CYSTOSCOPY WITH RETROGRADE PYELOGRAM;  Surgeon: Molli Hazard, MD;  Location: [facility];  Service: Urology;  Laterality: Bilateral;  . CYSTOSCOPY W/ RETROGRADES Bilateral 07/18/2016   Procedure: CYSTOSCOPY WITH RETROGRADE PYELOGRAM;  Surgeon: Alexis Frock, MD;  Location: [facility];  Service: Urology;  Laterality: Bilateral;  . ENDOSCOPIC REPAIR CSF LEAK VIA NASAL PASSAGE  2011  . INTRAOPERATIVE ARTERIOGRAM  CATH LAB  01-05-2009  DR Einar Gip   RICA   ACUTE ANGLE 80-85% STENOSIS  . KNEE ARTHROSCOPY Left 07/15/2017   Procedure: ARTHROSCOPY LEFT  KNEE AND DEBRIDEMENT, medial meniscal tear and chondromalaita;  Surgeon: Paralee Cancel, MD;  Location: [facility];  Service: Orthopedics;  Laterality: Left;  60 MINS  . LUMBAR FUSION  03-26-2012   L3 --  L5  . RADIOACTIVE PROSTATE SEED IMPLANTS  08-19-2003  . SEPTOPLASTY  1998  . TRANSURETHRAL RESECTION OF BLADDER TUMOR N/A 08/19/2013   Procedure: TRANSURETHRAL RESECTION OF BLADDER TUMOR (TURBT);  Surgeon: Molli Hazard, MD;  Location[facility];  Service: Urology;  Laterality: N/A;  . TRANSURETHRAL RESECTION OF BLADDER TUMOR N/A 07/18/2016   Procedure: TRANSURETHRAL RESECTION OF BLADDER TUMOR (TURBT);  Surgeon: Alexis Frock, MD;  Location[facility];  Service: Urology;  Laterality: N/A;  . YAG LASER APPLICATION  19/50/9326   Procedure: YAG LASER APPLICATION;  Surgeon: Elta Guadeloupe T. Gershon Crane, MD;  Location: [facility];  Service: Ophthalmology;  Laterality: Right;    No current facility-administered medications for this encounter.    Current Outpatient Medications  Medication Sig Dispense Refill Last Dose  . acetaminophen (TYLENOL) 500 MG tablet Take 1,000 mg by mouth 2 (two) times daily as needed for moderate pain or headache.   Taking  . allopurinol (ZYLOPRIM) 300 MG tablet Take 300 mg by mouth at bedtime.    Taking  . amLODipine (NORVASC) 10 MG tablet TAKE 1 TABLET BY  MOUTH EVERY DAY 90 tablet 0 Taking  . ASPERCREME LIDOCAINE EX Apply 1 application topically daily as needed (pain).   Taking  . aspirin 81 MG EC tablet Take 81 mg by mouth at bedtime.    Taking  . celecoxib (CELEBREX) 200 MG capsule Take 200 mg by mouth 2 (two) times daily.    Taking  . clopidogrel (PLAVIX) 75 MG tablet Take 75 mg by mouth daily.   6 Taking  . colchicine 0.6 MG tablet Take 0.6 mg by mouth 2 (two) times daily as needed (gout).    Taking  . docusate sodium (COLACE) 100 MG capsule Take 200 mg by mouth at bedtime.    Taking  . dutasteride (AVODART) 0.5 MG capsule Take 0.5 mg by mouth at bedtime.    Taking  . furosemide (LASIX) 40 MG tablet Take 40 mg by mouth every morning.    Taking  . gabapentin (NEURONTIN) 300 MG capsule Take 300 mg by mouth 3 (three) times daily.     Taking  . insulin detemir (LEVEMIR) 100 UNIT/ML injection Inject 25 Units into the skin at bedtime.    Taking  . labetalol (NORMODYNE) 300 MG tablet Take 300 mg by mouth 2 (two) times daily.   Taking  . losartan (COZAAR) 100  MG tablet TAKE 1 TABLET BY MOUTH DAILY 30 tablet 6 Taking  . Omega-3 Fatty Acids (FISH OIL) 1200 MG CAPS Take 1,200 mg by mouth 2 (two) times daily.   Taking  . polyethylene glycol powder (GLYCOLAX/MIRALAX) powder TAKE 17MG (1CAPFUL) BY MOUTH(MIXED WITH WATER/JUICE) DAILY (Patient taking differently: TAKE 17MG (1CAPFUL) BY MOUTH(MIXED WITH WATER/JUICE) EVERY EVENING.) 527 g 0 Taking  . rosuvastatin (CRESTOR) 10 MG tablet Take 10 mg by mouth every morning.    Taking  . finasteride (PROSCAR) 5 MG tablet Take 5 mg by mouth at bedtime.   Taking   Allergies  Allergen Reactions  . Dilaudid [Hydromorphone Hcl] Other (See Comments)    "acts a little off"  . Toradol [Ketorolac Tromethamine] Other (See Comments)    CauseD  hallucination ?-states not sure/ avoids per his MD  . Lyrica [Pregabalin] Nausea Only and Other (See Comments)    Make me feel "bad"    Social History   Tobacco Use  . Smoking status: Current Some Day Smoker    Packs/day: 2.00    Years: 47.00    Pack years: 94.00    Types: Cigarettes    Last attempt to quit: 03/17/2017    Years since quitting: 0.9  . Smokeless tobacco: Never Used  . Tobacco comment: pt quits smoking periodically :: started smoking again Jan 2018 1ppd  Substance Use Topics  . Alcohol use: No    Alcohol/week: 0.0 oz    Family History  Problem Relation Age of Onset  . Breast cancer Mother        mets  . Diabetes Mother   . Heart disease Father        MI  . Colon cancer Brother   . Cancer Sister        male organs  . Diabetes Sister        family hx  . Arthritis Other        entire family  . Diabetes Brother        x 2     Review of Systems  Constitutional: Negative.   HENT: Negative.   Eyes: Negative.   Respiratory: Negative.   Cardiovascular: Negative.   Gastrointestinal: Negative.   Genitourinary:  Positive for frequency.  Musculoskeletal: Positive for back pain and joint pain.  Skin: Negative.   Neurological: Negative.    Endo/Heme/Allergies: Negative.   Psychiatric/Behavioral: Negative.     Objective:  Physical Exam  Constitutional: He is oriented to person, place, and time. He appears well-developed.  HENT:  Head: Normocephalic.  Mouth/Throat: He has dentures.  Eyes: Pupils are equal, round, and reactive to light.  Neck: Neck supple. No JVD present. No tracheal deviation present. No thyromegaly present.  Cardiovascular: Normal rate, regular rhythm and intact distal pulses.  Respiratory: Effort normal and breath sounds normal. No respiratory distress. He has no wheezes.  GI: Soft. There is no tenderness. There is no guarding.  Musculoskeletal:       Left knee: He exhibits decreased range of motion, swelling and bony tenderness. He exhibits no ecchymosis, no deformity, no laceration and no erythema. Tenderness found.  Lymphadenopathy:    He has no cervical adenopathy.  Neurological: He is alert and oriented to person, place, and time.  Skin: Skin is warm and dry.  Psychiatric: He has a normal mood and affect.    Vital signs in last 24 hours: Pulse Rate:  [74] 74 (06/03 0816) BP: (182)/(80) 182/80 (06/03 0816) SpO2:  [97 %] 97 % (06/03 0816) Weight:  [96.6 kg (213 lb)] 96.6 kg (213 lb) (06/03 0816)  Labs:   Estimated body mass index is 31.45 kg/m as calculated from the following:   Height as of 02/17/18: 5\' 9"  (1.753 m).   Weight as of 02/17/18: 96.6 kg (213 lb).   Imaging Review Plain radiographs demonstrate severe degenerative joint disease of the left knee.  The bone quality appears to be good for age and reported activity level.   Preoperative templating of the joint replacement has been completed, documented, and submitted to the Operating Room personnel in order to optimize intra-operative equipment management.   Anticipated LOS equal to or greater than 2 midnights due to - Age 83 and older with one or more of the following:  - Obesity  - Expected need for hospital services (PT,  OT, Nursing) required for safe  discharge  - Anticipated need for postoperative skilled nursing care or inpatient rehab  - Active co-morbidities: Diabetes and Coronary Artery Disease      Assessment/Plan:  End stage arthritis, left knee   The patient history, physical examination, clinical judgment of the provider and imaging studies are consistent with end stage degenerative joint disease of the left knee(s) and total knee arthroplasty is deemed medically necessary. The treatment options including medical management, injection therapy arthroscopy and arthroplasty were discussed at length. The risks and benefits of total knee arthroplasty were presented and reviewed. The risks due to aseptic loosening, infection, stiffness, patella tracking problems, thromboembolic complications and other imponderables were discussed. The patient acknowledged the explanation, agreed to proceed with the plan and consent was signed. Patient is being admitted for inpatient treatment for surgery, pain control, PT, OT, prophylactic antibiotics, VTE prophylaxis, progressive ambulation and ADL's and discharge planning. The patient is planning to be discharged home.      West Pugh Mamoudou [place]ve[place]   PA-C  02/17/2018, 8:40 AM

## 2018-02-17 NOTE — Progress Notes (Signed)
Clinical Summary [name] is a 72 y.o.male seen today for follow up of the following medical problems.   1. HTN - compliant with meds, he just took meds prior to appointment.   2. Carotid stenosis - RICA >95%, LICA <63%.  - has been on ASA and plavix, presume due to his history cerebrovascular disease - followed by vascular. He wants to follow here for serial imaging.   - no recent neuro symptoms   3. Hyperlipidemia - compliant with statin - reports recent labs with pcp  4. OSA - compliant with CPAP  5. Prostate CA - history of prior seed implant, reports cancer free  6. CAD - incidental findings form prior chest CT in 2013. Do not see a previous functional assessment for ischemia. The patient has not had symptoms of angina - 05/2017 nuclear stress: possible mild inferoseptal ischemia vs artifact, low risk  - no recent chest pain. No SOB or DOE - exertion limited by knee pain.   7. AAA screen - male over 71 former smoker - 02/2014 CT showed no aneurysm  8. Preoperative evaluation - being considered for knee replacement.  - recent low risk stress test.      Past Medical History:  Diagnosis Date  . Bilateral renal cysts   . Bladder cancer ([place]) UROLOGIST-  DR [facility]   RECURRENT 11/ 2017 --  hx turbt in 2004 and 08-19-2013  . BPH without obstruction/lower urinary tract symptoms   . Cervical fusion syndrome    LEFT ARM NUMBNESS--  CONTROLLED W/ GABAPENTIN  . Coronary artery disease    CARDIOLOGIST-- DR Zakaria Sedor [name])  . DDD (degenerative disc disease), cervical   . DDD (degenerative disc disease), lumbar   . Diverticulosis of sigmoid colon   . History of acute gouty arthritis   . History of adenomatous polyp of colon    2001;  2012- non-malignant leiomyoma;  04-07-2014  tubualr adenoma and hyperplastic polyp's  . History of prostate cancer UROLOGIST-  DR [facility]   S/P  RADIOACTIVE SEED IMPLANTS 2004  . HOH (hard of hearing)    no  hearing aids  . Hyperlipidemia   . Hypertension   . OA (osteoarthritis)   . Occlusion and stenosis of carotid artery without mention of cerebral infarction CARDIOLOGIST  -- DR  Roderic Palau [place]   LAST DUPLEX  11-23-2015  RICA  (DUPLEX DOPPLER 01-20-2016 RICA 87-56% PROXIMAL) /   LICA <43% (HAD CONSULT W/ [name] , VASCULAR SURGEON 01-20-2016)  . OSA on CPAP    BiPAP  . Spondylosis, cervical   . Type 2 diabetes mellitus ([place])   . Wears glasses      Allergies  Allergen Reactions  . Dilaudid [Hydromorphone Hcl] Other (See Comments)    "acts a little off"  . Toradol [Ketorolac Tromethamine] Other (See Comments)    CauseD  hallucination ?-states not sure/ avoids per his MD  . Lyrica [Pregabalin] Nausea Only and Other (See Comments)    Make me feel "bad"     Current Outpatient Medications  Medication Sig Dispense Refill  . acetaminophen (TYLENOL) 500 MG tablet Take 1,000 mg by mouth 2 (two) times daily as needed for moderate pain or headache.    . allopurinol (ZYLOPRIM) 300 MG tablet Take 300 mg by mouth at bedtime.     Marland Kitchen amLODipine (NORVASC) 10 MG tablet TAKE 1 TABLET BY MOUTH EVERY DAY 90 tablet 0  . ASPERCREME LIDOCAINE EX Apply 1 application topically daily as needed (  pain).    . aspirin 81 MG EC tablet Take 81 mg by mouth at bedtime.     . celecoxib (CELEBREX) 200 MG capsule Take 200 mg by mouth 2 (two) times daily.     . clopidogrel (PLAVIX) 75 MG tablet Take 75 mg by mouth daily.   6  . colchicine 0.6 MG tablet Take 0.6 mg by mouth 2 (two) times daily as needed (gout).     Marland Kitchen docusate sodium (COLACE) 100 MG capsule Take 200 mg by mouth at bedtime.     . dutasteride (AVODART) 0.5 MG capsule Take 0.5 mg by mouth at bedtime.     . finasteride (PROSCAR) 5 MG tablet Take 5 mg by mouth at bedtime.    . furosemide (LASIX) 40 MG tablet Take 40 mg by mouth every morning.     . gabapentin (NEURONTIN) 300 MG capsule Take 300 mg by mouth 3 (three) times daily.      . insulin detemir  (LEVEMIR) 100 UNIT/ML injection Inject 25 Units into the skin at bedtime.     Marland Kitchen labetalol (NORMODYNE) 300 MG tablet Take 300 mg by mouth 2 (two) times daily.    Marland Kitchen losartan (COZAAR) 100 MG tablet TAKE 1 TABLET BY MOUTH DAILY 30 tablet 6  . Omega-3 Fatty Acids (FISH OIL) 1200 MG CAPS Take 1,200 mg by mouth 2 (two) times daily.    . polyethylene glycol powder (GLYCOLAX/MIRALAX) powder TAKE 17MG (1CAPFUL) BY MOUTH(MIXED WITH WATER/JUICE) DAILY (Patient taking differently: TAKE 17MG (1CAPFUL) BY MOUTH(MIXED WITH WATER/JUICE) EVERY EVENING.) 527 g 0  . rosuvastatin (CRESTOR) 10 MG tablet Take 10 mg by mouth every morning.      No current facility-administered medications for this visit.      Past Surgical History:  Procedure Laterality Date  . ANTERIOR REMOVAL CAGE AND PLATE C3-C7/ CORPECTOMY C7 (FX)/  ALLOGRAFT AND FUSION C3 -- T1/  POSTERIOR DECOMPRESSION BILATERAL LAMINECTOMY C4 -- 6 & PARTIAL C3/  POSTEROLATERAL ARTHRODESIS C3 - T1  06/05/2005  . APPENDECTOMY  1990'S  . CATARACT EXTRACTION W/ INTRAOCULAR LENS  IMPLANT, BILATERAL  2013  . CERVICAL FUSION  05/11/2005   C3  --  C7/  due to post op quadriparesis same day s/p  anterior C 4,5,6, colpectomy, decompression, removal epidural hematoma, foraminnotomy, cage and plate  . COLONOSCOPY  last one 04-07-2014  . CYSTOSCOPY W/ RETROGRADES Bilateral 08/19/2013   Procedure: CYSTOSCOPY WITH RETROGRADE PYELOGRAM;  Surgeon: Molli Hazard, MD;  Location: [facility];  Service: Urology;  Laterality: Bilateral;  . CYSTOSCOPY W/ RETROGRADES Bilateral 07/18/2016   Procedure: CYSTOSCOPY WITH RETROGRADE PYELOGRAM;  Surgeon: Alexis Frock, MD;  Location: [facility];  Service: Urology;  Laterality: Bilateral;  . ENDOSCOPIC REPAIR CSF LEAK VIA NASAL PASSAGE  2011  . INTRAOPERATIVE ARTERIOGRAM  CATH LAB  01-05-2009  DR Einar Gip   RICA   ACUTE ANGLE 80-85% STENOSIS  . KNEE ARTHROSCOPY Left 07/15/2017   Procedure: ARTHROSCOPY  LEFT  KNEE AND DEBRIDEMENT, medial meniscal tear and chondromalaita;  Surgeon: Paralee Cancel, MD;  Location: [facility];  Service: Orthopedics;  Laterality: Left;  60 MINS  . LUMBAR FUSION  03-26-2012   L3 --  L5  . RADIOACTIVE PROSTATE SEED IMPLANTS  08-19-2003  . SEPTOPLASTY  1998  . TRANSURETHRAL RESECTION OF BLADDER TUMOR N/A 08/19/2013   Procedure: TRANSURETHRAL RESECTION OF BLADDER TUMOR (TURBT);  Surgeon: Molli Hazard, MD;  Location: [facility];  Service: Urology;  Laterality: N/A;  . TRANSURETHRAL RESECTION  OF BLADDER TUMOR N/A 07/18/2016   Procedure: TRANSURETHRAL RESECTION OF BLADDER TUMOR (TURBT);  Surgeon: Alexis Frock, MD;  Location: [facility];  Service: Urology;  Laterality: N/A;  . YAG LASER APPLICATION  08/65/7846   Procedure: YAG LASER APPLICATION;  Surgeon: Elta Guadeloupe T. Gershon Crane, MD;  Location: [facility];  Service: Ophthalmology;  Laterality: Right;     Allergies  Allergen Reactions  . Dilaudid [Hydromorphone Hcl] Other (See Comments)    "acts a little off"  . Toradol [Ketorolac Tromethamine] Other (See Comments)    CauseD  hallucination ?-states not sure/ avoids per his MD  . Lyrica [Pregabalin] Nausea Only and Other (See Comments)    Make me feel "bad"      Family History  Problem Relation Age of Onset  . Breast cancer Mother        mets  . Diabetes Mother   . Heart disease Father        MI  . Colon cancer Brother   . Cancer Sister        male organs  . Diabetes Sister        family hx  . Arthritis Other        entire family  . Diabetes Brother        x 2     Social History [name] reports that he has been smoking cigarettes.  He has a 94.00 pack-year smoking history. He has never used smokeless tobacco. [name] reports that he does not drink alcohol.   Review of Systems CONSTITUTIONAL: No weight loss, fever, chills, weakness or fatigue.  HEENT: Eyes: No visual loss, blurred vision, double vision or yellow  sclerae.No hearing loss, sneezing, congestion, runny nose or sore throat.  SKIN: No rash or itching.  CARDIOVASCULAR: per hpi RESPIRATORY: No shortness of breath, cough or sputum.  GASTROINTESTINAL: No anorexia, nausea, vomiting or diarrhea. No abdominal pain or blood.  GENITOURINARY: No burning on urination, no polyuria NEUROLOGICAL: No headache, dizziness, syncope, paralysis, ataxia, numbness or tingling in the extremities. No change in bowel or bladder control.  MUSCULOSKELETAL: knee pain LYMPHATICS: No enlarged nodes. No history of splenectomy.  PSYCHIATRIC: No history of depression or anxiety.  ENDOCRINOLOGIC: No reports of sweating, cold or heat intolerance. No polyuria or polydipsia.  Marland Kitchen   Physical Examination Vitals:   02/17/18 0816  BP: (!) 182/80  Pulse: 74  SpO2: 97%   Vitals:   02/17/18 0816  Weight: 213 lb (96.6 kg)  Height: 5\' 9"  (1.753 m)    Gen: resting comfortably, no acute distress HEENT: no scleral icterus, pupils equal round and reactive, no palptable cervical adenopathy,  CV: RRR, 2/6 systolic murmur rusb, right carotid bruit Resp: Clear to auscultation bilaterally GI: abdomen is soft, non-tender, non-distended, normal bowel sounds, no hepatosplenomegaly MSK: extremities are warm, no edema.  Skin: warm, no rash Neuro:  no focal deficits Psych: appropriate affect   Diagnostic Studies   05/2017 nuclear stress  No diagnostic ST segment changes indicating ischemia. No arrhythmias.  Moderate-sized, mild intensity, inferior and inferoseptal defects that are largely fixed with a partial region of reversibility in the mid inferoseptal zone. Normal wall motion in this area argues against scar. Although diaphragmatic attenuation is likely playing a role, consider a small region of inferoseptal ischemia.  This is a low risk study.  Nuclear stress EF: 64%.  Assessment and Plan  1. HTN - manual recheck 158/80, he just took his meds this AM - he is to repeat  his bp later this afternoon at home, pending results consider titrating up his labetalol  2. Carotid stenosis - no symptoms, continue medical therapy.   3. Hyperlipidemia -he will continue statin, request labs from pcop  4. CAD - incidental finding on CT scan, he has not had any cardiac symptoms - recent low risk stress test - continue medical therapy.  - EKG today shows SR, no ischemic changes  5. Preoperative evaluatoin - recent low risk stress test - ok to proceed with surgery. Hold aspirin 7 days and plavix 5 days prior to surgery.     F/u 6 months   Arnoldo Lenis, M.D.,

## 2018-02-17 NOTE — Patient Instructions (Addendum)
Medication Instructions:   Your physician recommends that you continue on your current medications as directed. Please refer to the Current Medication list given to you today.  Labwork:  NONE  Testing/Procedures:  NONE  Follow-Up:  Your physician recommends that you schedule a follow-up appointment in: 6 months. You will receive a reminder letter in the mail in about 4 months reminding you to call and schedule your appointment. If you don't receive this letter, please contact our office.  Any Other Special Instructions Will Be Listed Below (If Applicable).  Please recheck your blood pressure today, and call our office with the result.  You have been given your surgical clearance form for [facility].  We have requested your last lab work from your family doctor.  If you need a refill on your cardiac medications before your next appointment, please call your pharmacy.

## 2018-02-17 NOTE — Progress Notes (Signed)
LOV/CARDIAC CLEARANCE DR. Carlyle Dolly 02-17-18 Epic   EKG 02-17-18 Epic   STRESS TEST 06-07-17 Epic   LOV/CLEARANCE DR. ZACK HALL 02-12-18 WITH LABS DATED 12-02-17 ON CHART

## 2018-02-19 ENCOUNTER — Encounter (HOSPITAL_COMMUNITY)
Admission: RE | Admit: 2018-02-19 | Discharge: 2018-02-19 | Disposition: A | Discharge status: Home / Self Care | Payer: Medicare HMO | Source: Ambulatory Visit | Attending: Orthopedic Surgery | Admitting: Orthopedic Surgery

## 2018-02-19 ENCOUNTER — Other Ambulatory Visit: Payer: Self-pay

## 2018-02-19 ENCOUNTER — Encounter (HOSPITAL_COMMUNITY): Payer: Self-pay

## 2018-02-19 DIAGNOSIS — M25562 Pain in left knee: Secondary | ICD-10-CM | POA: Diagnosis not present

## 2018-02-19 DIAGNOSIS — Z01812 Encounter for preprocedural laboratory examination: Secondary | ICD-10-CM | POA: Diagnosis not present

## 2018-02-19 DIAGNOSIS — M1712 Unilateral primary osteoarthritis, left knee: Secondary | ICD-10-CM | POA: Insufficient documentation

## 2018-02-19 DIAGNOSIS — G4733 Obstructive sleep apnea (adult) (pediatric): Secondary | ICD-10-CM | POA: Insufficient documentation

## 2018-02-19 DIAGNOSIS — E119 Type 2 diabetes mellitus without complications: Secondary | ICD-10-CM | POA: Diagnosis not present

## 2018-02-19 DIAGNOSIS — E785 Hyperlipidemia, unspecified: Secondary | ICD-10-CM | POA: Diagnosis not present

## 2018-02-19 DIAGNOSIS — I10 Essential (primary) hypertension: Secondary | ICD-10-CM | POA: Diagnosis not present

## 2018-02-19 LAB — BASIC METABOLIC PANEL
Anion gap: 8 (ref 5–15)
BUN: 17 mg/dL (ref 6–20)
CO2: 27 mmol/L (ref 22–32)
Calcium: 9.4 mg/dL (ref 8.9–10.3)
Chloride: 105 mmol/L (ref 101–111)
Creatinine, Ser: 1.06 mg/dL (ref 0.61–1.24)
GFR calc Af Amer: >60 mL/min (ref >60)
GFR calc non Af Amer: >60 mL/min (ref >60)
Glucose, Bld: 157 mg/dL — ABNORMAL HIGH (ref 65–99)
Potassium: 4.3 mmol/L (ref 3.5–5.1)
SODIUM: 140 mmol/L (ref 135–145)

## 2018-02-19 LAB — CBC
HCT: 39.1 % (ref 39.0–52.0)
Hemoglobin: 13.3 g/dL (ref 13.0–17.0)
MCH: 31.1 pg (ref 26.0–34.0)
MCHC: 34 g/dL (ref 30.0–36.0)
MCV: 91.6 fL (ref 78.0–100.0)
Platelets: 177 10*3/uL (ref 150–400)
RBC: 4.27 MIL/uL (ref 4.22–5.81)
RDW: 13.5 % (ref 11.5–15.5)
WBC: 6.8 10*3/uL (ref 4.0–10.5)

## 2018-02-19 LAB — SURGICAL PCR SCREEN
MRSA, PCR: NEGATIVE
Staphylococcus aureus: POSITIVE — AB

## 2018-02-19 LAB — GLUCOSE, CAPILLARY: Glucose-Capillary: 170 mg/dL — ABNORMAL HIGH (ref 65–99)

## 2018-02-19 LAB — HEMOGLOBIN A1C
Hgb A1c MFr Bld: 6.7 % — ABNORMAL HIGH (ref 4.8–5.6)
Mean Plasma Glucose: 145.59 mg/dL

## 2018-02-19 NOTE — Progress Notes (Signed)
RN called and spoke to patient and his wife abut time change of surgery. Patient will arrive to admitting at 0530 for check in. Npo after midnight; sips of water with am medications

## 2018-02-20 ENCOUNTER — Other Ambulatory Visit (HOSPITAL_COMMUNITY): Payer: Self-pay | Admitting: Emergency Medicine

## 2018-02-24 MED ORDER — TRANEXAMIC ACID 1000 MG/10ML IV SOLN
1000.0000 mg | INTRAVENOUS | Status: AC
  Administered 2018-02-25: 1000 mg via INTRAVENOUS
  Filled 2018-02-24: qty 1100

## 2018-02-25 ENCOUNTER — Observation Stay (HOSPITAL_COMMUNITY)
Admission: RE | Admit: 2018-02-25 | Discharge: 2018-02-26 | Disposition: A | Discharge status: Home / Self Care | Payer: Medicare HMO | Source: Ambulatory Visit | Attending: Orthopedic Surgery | Admitting: Orthopedic Surgery

## 2018-02-25 ENCOUNTER — Encounter (HOSPITAL_COMMUNITY)
Admission: RE | Disposition: A | Discharge status: Home / Self Care | Payer: Self-pay | Source: Ambulatory Visit | Attending: Orthopedic Surgery

## 2018-02-25 ENCOUNTER — Other Ambulatory Visit: Payer: Self-pay

## 2018-02-25 ENCOUNTER — Inpatient Hospital Stay (HOSPITAL_COMMUNITY): Payer: Medicare HMO | Admitting: Certified Registered Nurse Anesthetist

## 2018-02-25 ENCOUNTER — Encounter (HOSPITAL_COMMUNITY): Payer: Self-pay | Admitting: Emergency Medicine

## 2018-02-25 DIAGNOSIS — I110 Hypertensive heart disease with heart failure: Secondary | ICD-10-CM | POA: Insufficient documentation

## 2018-02-25 DIAGNOSIS — G8918 Other acute postprocedural pain: Secondary | ICD-10-CM | POA: Diagnosis not present

## 2018-02-25 DIAGNOSIS — M1712 Unilateral primary osteoarthritis, left knee: Principal | ICD-10-CM | POA: Insufficient documentation

## 2018-02-25 DIAGNOSIS — Z794 Long term (current) use of insulin: Secondary | ICD-10-CM | POA: Diagnosis not present

## 2018-02-25 DIAGNOSIS — G4733 Obstructive sleep apnea (adult) (pediatric): Secondary | ICD-10-CM | POA: Diagnosis not present

## 2018-02-25 DIAGNOSIS — J449 Chronic obstructive pulmonary disease, unspecified: Secondary | ICD-10-CM | POA: Insufficient documentation

## 2018-02-25 DIAGNOSIS — Z6831 Body mass index (BMI) 31.0-31.9, adult: Secondary | ICD-10-CM | POA: Diagnosis not present

## 2018-02-25 DIAGNOSIS — Z7982 Long term (current) use of aspirin: Secondary | ICD-10-CM | POA: Diagnosis not present

## 2018-02-25 DIAGNOSIS — Z8546 Personal history of malignant neoplasm of prostate: Secondary | ICD-10-CM | POA: Insufficient documentation

## 2018-02-25 DIAGNOSIS — I252 Old myocardial infarction: Secondary | ICD-10-CM | POA: Insufficient documentation

## 2018-02-25 DIAGNOSIS — Z79899 Other long term (current) drug therapy: Secondary | ICD-10-CM | POA: Diagnosis not present

## 2018-02-25 DIAGNOSIS — I2510 Atherosclerotic heart disease of native coronary artery without angina pectoris: Secondary | ICD-10-CM | POA: Diagnosis not present

## 2018-02-25 DIAGNOSIS — Z96659 Presence of unspecified artificial knee joint: Secondary | ICD-10-CM

## 2018-02-25 DIAGNOSIS — I509 Heart failure, unspecified: Secondary | ICD-10-CM | POA: Diagnosis not present

## 2018-02-25 DIAGNOSIS — E785 Hyperlipidemia, unspecified: Secondary | ICD-10-CM | POA: Diagnosis not present

## 2018-02-25 DIAGNOSIS — E669 Obesity, unspecified: Secondary | ICD-10-CM | POA: Diagnosis present

## 2018-02-25 DIAGNOSIS — F17210 Nicotine dependence, cigarettes, uncomplicated: Secondary | ICD-10-CM | POA: Diagnosis not present

## 2018-02-25 DIAGNOSIS — N400 Enlarged prostate without lower urinary tract symptoms: Secondary | ICD-10-CM | POA: Insufficient documentation

## 2018-02-25 DIAGNOSIS — Z96652 Presence of left artificial knee joint: Secondary | ICD-10-CM

## 2018-02-25 DIAGNOSIS — I10 Essential (primary) hypertension: Secondary | ICD-10-CM | POA: Diagnosis not present

## 2018-02-25 DIAGNOSIS — E119 Type 2 diabetes mellitus without complications: Secondary | ICD-10-CM | POA: Insufficient documentation

## 2018-02-25 HISTORY — PX: TOTAL KNEE ARTHROPLASTY: SHX125

## 2018-02-25 LAB — TYPE AND SCREEN
ABO/RH(D): O POS
ANTIBODY SCREEN: NEGATIVE

## 2018-02-25 LAB — GLUCOSE, CAPILLARY
GLUCOSE-CAPILLARY: 166 mg/dL — AB (ref 65–99)
GLUCOSE-CAPILLARY: 211 mg/dL — AB (ref 65–99)
GLUCOSE-CAPILLARY: 162 mg/dL — AB (ref 65–99)
Glucose-Capillary: 157 mg/dL — ABNORMAL HIGH (ref 65–99)
Glucose-Capillary: 161 mg/dL — ABNORMAL HIGH (ref 65–99)

## 2018-02-25 LAB — PROTIME-INR
INR: 1.06
PROTHROMBIN TIME: 13.7 s (ref 11.4–15.2)

## 2018-02-25 SURGERY — ARTHROPLASTY, KNEE, TOTAL
Anesthesia: General | Site: Knee | Laterality: Left

## 2018-02-25 MED ORDER — CEFAZOLIN SODIUM-DEXTROSE 2-4 GM/100ML-% IV SOLN
2.0000 g | Freq: Four times a day (QID) | INTRAVENOUS | Status: AC
  Administered 2018-02-25 (×2): 2 g via INTRAVENOUS
  Filled 2018-02-25 (×2): qty 100

## 2018-02-25 MED ORDER — DEXAMETHASONE SODIUM PHOSPHATE 10 MG/ML IJ SOLN
INTRAMUSCULAR | Status: AC
  Filled 2018-02-25: qty 1

## 2018-02-25 MED ORDER — ROPIVACAINE HCL 7.5 MG/ML IJ SOLN
INTRAMUSCULAR | Status: DC | PRN
  Administered 2018-02-25: 20 mL via PERINEURAL

## 2018-02-25 MED ORDER — HYDROCODONE-ACETAMINOPHEN 7.5-325 MG PO TABS
1.0000 | ORAL_TABLET | ORAL | Status: DC | PRN
  Administered 2018-02-25 – 2018-02-26 (×4): 2 via ORAL
  Filled 2018-02-25 (×4): qty 2

## 2018-02-25 MED ORDER — METOCLOPRAMIDE HCL 5 MG PO TABS: 5.0000 mg | ORAL_TABLET | Freq: Three times a day (TID) | ORAL | Status: DC | PRN

## 2018-02-25 MED ORDER — FERROUS SULFATE 325 (65 FE) MG PO TABS
325.0000 mg | ORAL_TABLET | Freq: Three times a day (TID) | ORAL | Status: DC
  Administered 2018-02-26: 325 mg via ORAL
  Filled 2018-02-25: qty 1

## 2018-02-25 MED ORDER — BISACODYL 10 MG RE SUPP: 10.0000 mg | Freq: Every day | RECTAL | Status: DC | PRN

## 2018-02-25 MED ORDER — ONDANSETRON HCL 4 MG/2ML IJ SOLN
INTRAMUSCULAR | Status: DC | PRN
  Administered 2018-02-25: 4 mg via INTRAVENOUS

## 2018-02-25 MED ORDER — ALLOPURINOL 300 MG PO TABS
300.0000 mg | ORAL_TABLET | Freq: Every day | ORAL | Status: DC
  Administered 2018-02-25: 300 mg via ORAL
  Filled 2018-02-25: qty 1

## 2018-02-25 MED ORDER — BUPIVACAINE-EPINEPHRINE (PF) 0.25% -1:200000 IJ SOLN
INTRAMUSCULAR | Status: AC
  Filled 2018-02-25: qty 30

## 2018-02-25 MED ORDER — LABETALOL HCL 100 MG PO TABS
300.0000 mg | ORAL_TABLET | Freq: Two times a day (BID) | ORAL | Status: DC
  Administered 2018-02-25 – 2018-02-26 (×2): 300 mg via ORAL
  Filled 2018-02-25 (×2): qty 3

## 2018-02-25 MED ORDER — MORPHINE SULFATE (PF) 4 MG/ML IV SOLN
INTRAVENOUS | Status: AC
  Filled 2018-02-25: qty 1

## 2018-02-25 MED ORDER — INSULIN ASPART 100 UNIT/ML ~~LOC~~ SOLN
0.0000 [IU] | Freq: Three times a day (TID) | SUBCUTANEOUS | Status: DC
  Administered 2018-02-25: 5 [IU] via SUBCUTANEOUS
  Administered 2018-02-25 – 2018-02-26 (×2): 3 [IU] via SUBCUTANEOUS

## 2018-02-25 MED ORDER — EPHEDRINE 5 MG/ML INJ
INTRAVENOUS | Status: AC
  Filled 2018-02-25: qty 10

## 2018-02-25 MED ORDER — ASPIRIN 81 MG PO CHEW
81.0000 mg | CHEWABLE_TABLET | Freq: Every day | ORAL | Status: DC
  Administered 2018-02-25: 81 mg via ORAL
  Filled 2018-02-25: qty 1

## 2018-02-25 MED ORDER — ALUM & MAG HYDROXIDE-SIMETH 200-200-20 MG/5ML PO SUSP: 15.0000 mL | ORAL | Status: DC | PRN

## 2018-02-25 MED ORDER — COLCHICINE 0.6 MG PO TABS: 0.6000 mg | ORAL_TABLET | Freq: Two times a day (BID) | ORAL | Status: DC | PRN

## 2018-02-25 MED ORDER — DUTASTERIDE 0.5 MG PO CAPS
0.5000 mg | ORAL_CAPSULE | Freq: Every day | ORAL | Status: DC
  Administered 2018-02-25: 0.5 mg via ORAL
  Filled 2018-02-25: qty 1

## 2018-02-25 MED ORDER — MAGNESIUM CITRATE PO SOLN: 1.0000 | Freq: Once | ORAL | Status: DC | PRN

## 2018-02-25 MED ORDER — HYDROCODONE-ACETAMINOPHEN 7.5-325 MG PO TABS: 1.0000 | ORAL_TABLET | ORAL | 0 refills | Status: DC | PRN

## 2018-02-25 MED ORDER — TOBRAMYCIN SULFATE 1.2 G IJ SOLR
INTRAMUSCULAR | Status: DC | PRN
  Administered 2018-02-25: 1.2 g

## 2018-02-25 MED ORDER — DOCUSATE SODIUM 100 MG PO CAPS
100.0000 mg | ORAL_CAPSULE | Freq: Two times a day (BID) | ORAL | Status: DC
  Administered 2018-02-25 – 2018-02-26 (×2): 100 mg via ORAL
  Filled 2018-02-25 (×2): qty 1

## 2018-02-25 MED ORDER — CLOPIDOGREL BISULFATE 75 MG PO TABS
75.0000 mg | ORAL_TABLET | Freq: Every day | ORAL | Status: DC
  Administered 2018-02-26: 75 mg via ORAL
  Filled 2018-02-25: qty 1

## 2018-02-25 MED ORDER — KETOROLAC TROMETHAMINE 30 MG/ML IJ SOLN
INTRAMUSCULAR | Status: AC
  Filled 2018-02-25: qty 1

## 2018-02-25 MED ORDER — GABAPENTIN 300 MG PO CAPS
300.0000 mg | ORAL_CAPSULE | Freq: Three times a day (TID) | ORAL | Status: DC
  Administered 2018-02-25 – 2018-02-26 (×3): 300 mg via ORAL
  Filled 2018-02-25 (×3): qty 1

## 2018-02-25 MED ORDER — INSULIN DETEMIR 100 UNIT/ML ~~LOC~~ SOLN
25.0000 [IU] | Freq: Every day | SUBCUTANEOUS | Status: DC
  Administered 2018-02-25: 25 [IU] via SUBCUTANEOUS
  Filled 2018-02-25: qty 0.25

## 2018-02-25 MED ORDER — CHLORHEXIDINE GLUCONATE 4 % EX LIQD: 60.0000 mL | Freq: Once | CUTANEOUS | Status: DC

## 2018-02-25 MED ORDER — BUPIVACAINE-EPINEPHRINE (PF) 0.25% -1:200000 IJ SOLN
INTRAMUSCULAR | Status: DC | PRN
  Administered 2018-02-25: 30 mL

## 2018-02-25 MED ORDER — ACETAMINOPHEN 325 MG PO TABS: 325.0000 mg | ORAL_TABLET | Freq: Four times a day (QID) | ORAL | Status: DC | PRN

## 2018-02-25 MED ORDER — VANCOMYCIN HCL 1000 MG IV SOLR
INTRAVENOUS | Status: AC
  Filled 2018-02-25: qty 2000

## 2018-02-25 MED ORDER — TRANEXAMIC ACID 1000 MG/10ML IV SOLN
1000.0000 mg | Freq: Once | INTRAVENOUS | Status: AC
  Administered 2018-02-25: 1000 mg via INTRAVENOUS
  Filled 2018-02-25: qty 1100

## 2018-02-25 MED ORDER — PHENOL 1.4 % MT LIQD: 1.0000 | OROMUCOSAL | Status: DC | PRN

## 2018-02-25 MED ORDER — LACTATED RINGERS IV SOLN
INTRAVENOUS | Status: DC | PRN
  Administered 2018-02-25 (×2): via INTRAVENOUS

## 2018-02-25 MED ORDER — METHOCARBAMOL 500 MG PO TABS: 500.0000 mg | ORAL_TABLET | Freq: Four times a day (QID) | ORAL | 0 refills | Status: DC | PRN

## 2018-02-25 MED ORDER — FENTANYL CITRATE (PF) 100 MCG/2ML IJ SOLN
INTRAMUSCULAR | Status: AC
  Filled 2018-02-25: qty 2

## 2018-02-25 MED ORDER — PROPOFOL 10 MG/ML IV BOLUS
INTRAVENOUS | Status: AC
  Filled 2018-02-25: qty 20

## 2018-02-25 MED ORDER — MENTHOL 3 MG MT LOZG: 1.0000 | LOZENGE | OROMUCOSAL | Status: DC | PRN

## 2018-02-25 MED ORDER — METOCLOPRAMIDE HCL 5 MG/ML IJ SOLN: 5.0000 mg | Freq: Three times a day (TID) | INTRAMUSCULAR | Status: DC | PRN

## 2018-02-25 MED ORDER — HYDROCODONE-ACETAMINOPHEN 5-325 MG PO TABS
1.0000 | ORAL_TABLET | ORAL | Status: DC | PRN
  Administered 2018-02-25: 2 via ORAL
  Filled 2018-02-25: qty 2

## 2018-02-25 MED ORDER — DEXAMETHASONE SODIUM PHOSPHATE 10 MG/ML IJ SOLN
10.0000 mg | Freq: Once | INTRAMUSCULAR | Status: AC
  Administered 2018-02-25: 10 mg via INTRAVENOUS

## 2018-02-25 MED ORDER — POLYETHYLENE GLYCOL 3350 17 G PO PACK
17.0000 g | PACK | Freq: Two times a day (BID) | ORAL | Status: DC
  Administered 2018-02-25 – 2018-02-26 (×2): 17 g via ORAL
  Filled 2018-02-25 (×2): qty 1

## 2018-02-25 MED ORDER — ONDANSETRON HCL 4 MG/2ML IJ SOLN
4.0000 mg | Freq: Four times a day (QID) | INTRAMUSCULAR | Status: DC | PRN
Start: 2018-02-25 — End: 2018-02-26

## 2018-02-25 MED ORDER — SODIUM CHLORIDE 0.9 % IJ SOLN
INTRAMUSCULAR | Status: AC
  Filled 2018-02-25: qty 50

## 2018-02-25 MED ORDER — SODIUM CHLORIDE 0.9 % IV SOLN
INTRAVENOUS | Status: DC
  Administered 2018-02-25: 12:00:00 via INTRAVENOUS

## 2018-02-25 MED ORDER — AMLODIPINE BESYLATE 10 MG PO TABS
10.0000 mg | ORAL_TABLET | Freq: Every day | ORAL | Status: DC
  Administered 2018-02-26: 10 mg via ORAL
  Filled 2018-02-25: qty 1

## 2018-02-25 MED ORDER — TOBRAMYCIN SULFATE 1.2 G IJ SOLR
INTRAMUSCULAR | Status: AC
  Filled 2018-02-25: qty 1.2

## 2018-02-25 MED ORDER — PHENYLEPHRINE 40 MCG/ML (10ML) SYRINGE FOR IV PUSH (FOR BLOOD PRESSURE SUPPORT)
PREFILLED_SYRINGE | INTRAVENOUS | Status: AC
  Filled 2018-02-25: qty 10

## 2018-02-25 MED ORDER — 0.9 % SODIUM CHLORIDE (POUR BTL) OPTIME
TOPICAL | Status: DC | PRN
  Administered 2018-02-25: 1000 mL

## 2018-02-25 MED ORDER — FENTANYL CITRATE (PF) 100 MCG/2ML IJ SOLN
25.0000 ug | INTRAMUSCULAR | Status: DC | PRN
  Administered 2018-02-25 (×2): 50 ug via INTRAVENOUS
  Filled 2018-02-25 (×2): qty 2

## 2018-02-25 MED ORDER — FINASTERIDE 5 MG PO TABS
5.0000 mg | ORAL_TABLET | Freq: Every day | ORAL | Status: DC
  Administered 2018-02-25: 5 mg via ORAL
  Filled 2018-02-25: qty 1

## 2018-02-25 MED ORDER — SODIUM CHLORIDE 0.9 % IR SOLN
Status: DC | PRN
  Administered 2018-02-25: 1000 mL

## 2018-02-25 MED ORDER — ACETAMINOPHEN 10 MG/ML IV SOLN
INTRAVENOUS | Status: AC
  Filled 2018-02-25: qty 100

## 2018-02-25 MED ORDER — PROPOFOL 10 MG/ML IV BOLUS
INTRAVENOUS | Status: DC | PRN
  Administered 2018-02-25: 160 mg via INTRAVENOUS

## 2018-02-25 MED ORDER — DEXAMETHASONE SODIUM PHOSPHATE 10 MG/ML IJ SOLN
10.0000 mg | Freq: Once | INTRAMUSCULAR | Status: AC
  Administered 2018-02-26: 10 mg via INTRAVENOUS
  Filled 2018-02-25: qty 1

## 2018-02-25 MED ORDER — SODIUM CHLORIDE 0.9 % IJ SOLN
INTRAMUSCULAR | Status: DC | PRN
  Administered 2018-02-25: 30 mL

## 2018-02-25 MED ORDER — METHOCARBAMOL 1000 MG/10ML IJ SOLN
500.0000 mg | Freq: Four times a day (QID) | INTRAVENOUS | Status: DC | PRN
  Administered 2018-02-25: 500 mg via INTRAVENOUS
  Filled 2018-02-25: qty 550

## 2018-02-25 MED ORDER — LIDOCAINE 2% (20 MG/ML) 5 ML SYRINGE
INTRAMUSCULAR | Status: DC | PRN
  Administered 2018-02-25: 100 mg via INTRAVENOUS

## 2018-02-25 MED ORDER — METHOCARBAMOL 500 MG PO TABS
500.0000 mg | ORAL_TABLET | Freq: Four times a day (QID) | ORAL | Status: DC | PRN
  Administered 2018-02-26: 500 mg via ORAL
  Filled 2018-02-25: qty 1

## 2018-02-25 MED ORDER — ACETAMINOPHEN 10 MG/ML IV SOLN
INTRAVENOUS | Status: DC | PRN
  Administered 2018-02-25: 1000 mg via INTRAVENOUS

## 2018-02-25 MED ORDER — EPHEDRINE SULFATE 50 MG/ML IJ SOLN
INTRAMUSCULAR | Status: DC | PRN
  Administered 2018-02-25 (×6): 10 mg via INTRAVENOUS

## 2018-02-25 MED ORDER — PROMETHAZINE HCL 25 MG/ML IJ SOLN: 6.2500 mg | INTRAMUSCULAR | Status: DC | PRN

## 2018-02-25 MED ORDER — VANCOMYCIN HCL 1 G IV SOLR
INTRAVENOUS | Status: DC | PRN
  Administered 2018-02-25: 1000 mg

## 2018-02-25 MED ORDER — KETOROLAC TROMETHAMINE 30 MG/ML IJ SOLN
INTRAMUSCULAR | Status: DC | PRN
  Administered 2018-02-25: 30 mg

## 2018-02-25 MED ORDER — CELECOXIB 200 MG PO CAPS
200.0000 mg | ORAL_CAPSULE | Freq: Two times a day (BID) | ORAL | Status: DC
  Administered 2018-02-25 – 2018-02-26 (×2): 200 mg via ORAL
  Filled 2018-02-25 (×2): qty 1

## 2018-02-25 MED ORDER — STERILE WATER FOR IRRIGATION IR SOLN
Status: DC | PRN
  Administered 2018-02-25: 2000 mL

## 2018-02-25 MED ORDER — CEFAZOLIN SODIUM-DEXTROSE 2-4 GM/100ML-% IV SOLN
2.0000 g | INTRAVENOUS | Status: AC
  Administered 2018-02-25: 2 g via INTRAVENOUS
  Filled 2018-02-25: qty 100

## 2018-02-25 MED ORDER — FENTANYL CITRATE (PF) 100 MCG/2ML IJ SOLN
INTRAMUSCULAR | Status: DC | PRN
  Administered 2018-02-25: 50 ug via INTRAVENOUS
  Administered 2018-02-25 (×2): 25 ug via INTRAVENOUS

## 2018-02-25 MED ORDER — MIDAZOLAM HCL 2 MG/2ML IJ SOLN
INTRAMUSCULAR | Status: AC
  Filled 2018-02-25: qty 2

## 2018-02-25 MED ORDER — MORPHINE SULFATE (PF) 4 MG/ML IV SOLN
1.0000 mg | INTRAVENOUS | Status: DC | PRN
  Administered 2018-02-25: 2 mg via INTRAVENOUS
  Administered 2018-02-25: 1 mg via INTRAVENOUS
  Administered 2018-02-25: 2 mg via INTRAVENOUS
  Administered 2018-02-25: 1 mg via INTRAVENOUS
  Administered 2018-02-25 (×3): 2 mg via INTRAVENOUS

## 2018-02-25 MED ORDER — FERROUS SULFATE 325 (65 FE) MG PO TABS: 325.0000 mg | ORAL_TABLET | Freq: Three times a day (TID) | ORAL | 3 refills | Status: DC

## 2018-02-25 MED ORDER — ONDANSETRON HCL 4 MG/2ML IJ SOLN
INTRAMUSCULAR | Status: AC
  Filled 2018-02-25: qty 2

## 2018-02-25 MED ORDER — DIPHENHYDRAMINE HCL 12.5 MG/5ML PO ELIX: 12.5000 mg | ORAL_SOLUTION | ORAL | Status: DC | PRN

## 2018-02-25 MED ORDER — ONDANSETRON HCL 4 MG PO TABS: 4.0000 mg | ORAL_TABLET | Freq: Four times a day (QID) | ORAL | Status: DC | PRN

## 2018-02-25 MED ORDER — ROSUVASTATIN CALCIUM 10 MG PO TABS
10.0000 mg | ORAL_TABLET | Freq: Every morning | ORAL | Status: DC
  Administered 2018-02-26: 10 mg via ORAL
  Filled 2018-02-25: qty 1

## 2018-02-25 MED ORDER — FUROSEMIDE 40 MG PO TABS
40.0000 mg | ORAL_TABLET | Freq: Every morning | ORAL | Status: DC
  Administered 2018-02-25 – 2018-02-26 (×2): 40 mg via ORAL
  Filled 2018-02-25 (×2): qty 1

## 2018-02-25 MED ORDER — PHENYLEPHRINE 40 MCG/ML (10ML) SYRINGE FOR IV PUSH (FOR BLOOD PRESSURE SUPPORT)
PREFILLED_SYRINGE | INTRAVENOUS | Status: DC | PRN
  Administered 2018-02-25 (×10): 80 ug via INTRAVENOUS

## 2018-02-25 MED ORDER — LOSARTAN POTASSIUM 50 MG PO TABS
100.0000 mg | ORAL_TABLET | Freq: Every day | ORAL | Status: DC
Start: 2018-02-25 — End: 2018-02-26
  Administered 2018-02-25 – 2018-02-26 (×2): 100 mg via ORAL
  Filled 2018-02-25 (×2): qty 2

## 2018-02-25 SURGICAL SUPPLY — 57 items
BAG DECANTER FOR FLEXI CONT (MISCELLANEOUS) IMPLANT
BAG ZIPLOCK 12X15 (MISCELLANEOUS) IMPLANT
BANDAGE ACE 6X5 VEL STRL LF (GAUZE/BANDAGES/DRESSINGS) ×2 IMPLANT
BLADE SAW SGTL 11.0X1.19X90.0M (BLADE) IMPLANT
BLADE SAW SGTL 13.0X1.19X90.0M (BLADE) ×2 IMPLANT
BOWL SMART MIX CTS (DISPOSABLE) ×2 IMPLANT
CAPT KNEE TOTAL 3 ATTUNE ×2 IMPLANT
CEMENT HV SMART SET (Cement) ×4 IMPLANT
COVER SURGICAL LIGHT HANDLE (MISCELLANEOUS) ×2 IMPLANT
CUFF TOURN SGL QUICK 34 (TOURNIQUET CUFF) ×1
CUFF TRNQT CYL 34X4X40X1 (TOURNIQUET CUFF) ×1 IMPLANT
DECANTER SPIKE VIAL GLASS SM (MISCELLANEOUS) ×2 IMPLANT
DERMABOND ADVANCED (GAUZE/BANDAGES/DRESSINGS) ×1
DERMABOND ADVANCED .7 DNX12 (GAUZE/BANDAGES/DRESSINGS) ×1 IMPLANT
DRAPE U-SHAPE 47X51 STRL (DRAPES) ×2 IMPLANT
DRESSING AQUACEL AG SP 3.5X10 (GAUZE/BANDAGES/DRESSINGS) ×1 IMPLANT
DRSG AQUACEL AG SP 3.5X10 (GAUZE/BANDAGES/DRESSINGS) ×2
DURAPREP 26ML APPLICATOR (WOUND CARE) ×4 IMPLANT
ELECT REM PT RETURN 15FT ADLT (MISCELLANEOUS) ×2 IMPLANT
GLOVE BIOGEL M 7.0 STRL (GLOVE) IMPLANT
GLOVE BIOGEL PI IND STRL 6.5 (GLOVE) ×1 IMPLANT
GLOVE BIOGEL PI IND STRL 7.0 (GLOVE) ×3 IMPLANT
GLOVE BIOGEL PI IND STRL 7.5 (GLOVE) ×3 IMPLANT
GLOVE BIOGEL PI IND STRL 8.5 (GLOVE) ×1 IMPLANT
GLOVE BIOGEL PI IND STRL 9 (GLOVE) ×1 IMPLANT
GLOVE BIOGEL PI INDICATOR 6.5 (GLOVE) ×1
GLOVE BIOGEL PI INDICATOR 7.0 (GLOVE) ×3
GLOVE BIOGEL PI INDICATOR 7.5 (GLOVE) ×3
GLOVE BIOGEL PI INDICATOR 8.5 (GLOVE) ×1
GLOVE BIOGEL PI INDICATOR 9 (GLOVE) ×1
GLOVE ECLIPSE 8.0 STRL XLNG CF (GLOVE) ×2 IMPLANT
GLOVE ORTHO TXT STRL SZ7.5 (GLOVE) ×4 IMPLANT
GLOVE SURG SS PI 6.5 STRL IVOR (GLOVE) ×2 IMPLANT
GOWN STRL REUS W/ TWL LRG LVL3 (GOWN DISPOSABLE) ×2 IMPLANT
GOWN STRL REUS W/TWL 2XL LVL3 (GOWN DISPOSABLE) ×2 IMPLANT
GOWN STRL REUS W/TWL LRG LVL3 (GOWN DISPOSABLE) ×4 IMPLANT
GOWN STRL REUS W/TWL XL LVL3 (GOWN DISPOSABLE) ×2 IMPLANT
HANDPIECE INTERPULSE COAX TIP (DISPOSABLE) ×1
HOLDER FOLEY CATH W/STRAP (MISCELLANEOUS) ×2 IMPLANT
MANIFOLD NEPTUNE II (INSTRUMENTS) ×2 IMPLANT
NDL SAFETY ECLIPSE 18X1.5 (NEEDLE) ×1 IMPLANT
NEEDLE HYPO 18GX1.5 SHARP (NEEDLE) ×1
PACK TOTAL KNEE CUSTOM (KITS) ×2 IMPLANT
POSITIONER SURGICAL ARM (MISCELLANEOUS) ×2 IMPLANT
SET HNDPC FAN SPRY TIP SCT (DISPOSABLE) ×1 IMPLANT
SET PAD KNEE POSITIONER (MISCELLANEOUS) ×2 IMPLANT
SUT MNCRL AB 4-0 PS2 18 (SUTURE) ×2 IMPLANT
SUT STRATAFIX PDS+ 0 24IN (SUTURE) ×2 IMPLANT
SUT VIC AB 1 CT1 36 (SUTURE) ×2 IMPLANT
SUT VIC AB 2-0 CT1 27 (SUTURE) ×3
SUT VIC AB 2-0 CT1 TAPERPNT 27 (SUTURE) ×3 IMPLANT
SYR 3ML LL SCALE MARK (SYRINGE) ×2 IMPLANT
SYR 50ML LL SCALE MARK (SYRINGE) IMPLANT
TRAY FOLEY MTR SLVR 16FR STAT (SET/KITS/TRAYS/PACK) ×2 IMPLANT
WATER STERILE IRR 1000ML POUR (IV SOLUTION) ×2 IMPLANT
WRAP KNEE MAXI GEL POST OP (GAUZE/BANDAGES/DRESSINGS) ×2 IMPLANT
YANKAUER SUCT BULB TIP 10FT TU (MISCELLANEOUS) ×2 IMPLANT

## 2018-02-25 NOTE — Anesthesia Preprocedure Evaluation (Signed)
Anesthesia Evaluation  Patient identified by MRN, date of birth, ID band Patient awake    Reviewed: Allergy & Precautions, NPO status , Patient's Chart, lab work & pertinent test results  Airway Mallampati: II  TM Distance: >3 FB Neck ROM: Full    Dental no notable dental hx.    Pulmonary sleep apnea and Continuous Positive Airway Pressure Ventilation , former smoker,    Pulmonary exam normal breath sounds clear to auscultation       Cardiovascular hypertension, + Peripheral Vascular Disease  Normal cardiovascular exam Rhythm:Regular Rate:Normal     Neuro/Psych negative neurological ROS  negative psych ROS   GI/Hepatic negative GI ROS, Neg liver ROS,   Endo/Other  negative endocrine ROSdiabetes  Renal/GU negative Renal ROS  negative genitourinary   Musculoskeletal negative musculoskeletal ROS (+)   Abdominal   Peds negative pediatric ROS (+)  Hematology negative hematology ROS (+)   Anesthesia Other Findings   Reproductive/Obstetrics negative OB ROS                             Anesthesia Physical Anesthesia Plan  ASA: III  Anesthesia Plan: General   Post-op Pain Management:  Regional for Post-op pain   Induction: Intravenous  PONV Risk Score and Plan: 2 and Ondansetron, Dexamethasone and Treatment may vary due to age or medical condition  Airway Management Planned: LMA and Oral ETT  Additional Equipment:   Intra-op Plan:   Post-operative Plan: Extubation in OR  Informed Consent: I have reviewed the patients History and Physical, chart, labs and discussed the procedure including the risks, benefits and alternatives for the proposed anesthesia with the patient or authorized representative who has indicated his/her understanding and acceptance.   Dental advisory given  Plan Discussed with: CRNA and Surgeon  Anesthesia Plan Comments:         Anesthesia Quick  Evaluation

## 2018-02-25 NOTE — Addendum Note (Signed)
Addendum  created 02/25/18 1731 by West Pugh, CRNA   Intraprocedure Flowsheets edited

## 2018-02-25 NOTE — Op Note (Signed)
NAME:  [name]                      MEDICAL RECORD NO.:  [id number]                             FACILITY:  [facility]      PHYSICIAN:  Pietro Cassis. Alvan Dame, M.D.  DATE OF BIRTH:  05-15-1946      DATE OF PROCEDURE:  02/25/2018                                     OPERATIVE REPORT         PREOPERATIVE DIAGNOSIS:  Left knee osteoarthritis.      POSTOPERATIVE DIAGNOSIS:  Left knee osteoarthritis.      FINDINGS:  The patient was noted to have complete loss of cartilage and   bone-on-bone arthritis with associated osteophytes in the medial and patellofemoral compartments of   the knee with a significant synovitis and associated effusion.  The patient had failed months of conservative treatment including medications, injection therapy, activity modification.     PROCEDURE:  Left total knee replacement.      COMPONENTS USED:  DePuy Attune rotating platform posterior stabilized knee   system, a size 4 femur, 5 tibia, szie 5 mm PS AOX insert, and 38 anatomic patellar   button.      SURGEON:  Pietro Cassis. Alvan Dame, M.D.      ASSISTANT:  Danae Orleans, PA-C.      ANESTHESIA:  General and Regional.      SPECIMENS:  None.      COMPLICATION:  None.      DRAINS:  None.  EBL: <100cc      TOURNIQUET TIME:   Total Tourniquet Time Documented: Thigh (Left) - 37 minutes Total: Thigh (Left) - 37 minutes  .      The patient was stable to the recovery room.      INDICATION FOR PROCEDURE:  [name] is a 72 y.o. male patient of   mine.  The patient had been seen, evaluated, and treated for months conservatively in the   office with medication, activity modification, and injections.  The patient had   radiographic changes of bone-on-bone arthritis with endplate sclerosis and osteophytes noted.  Based on the radiographic changes and failed conservative measures, the patient   decided to proceed with definitive treatment, total knee replacement.  Risks of infection, DVT, component failure, need for  revision surgery, neurovascular injury were reviewed in the office setting.  The postop course was reviewed stressing the efforts to maximize post-operative satisfaction and function.  Consent was obtained for benefit of pain   relief.      PROCEDURE IN DETAIL:  The patient was brought to the operative theater.   Once adequate anesthesia, preoperative antibiotics, 2 gm of Ancef,1 gm of Tranexamic Acid, and 10 mg of Decadron administered, the patient was positioned supine with a left thigh tourniquet placed.  The  left lower extremity was prepped and draped in sterile fashion.  A time-   out was performed identifying the patient, planned procedure, and the appropriate extremity.      The left lower extremity was placed in the [facility] leg holder.  The leg was   exsanguinated, tourniquet elevated to 250 mmHg.  A midline incision was   made  followed by median parapatellar arthrotomy.  Following initial   exposure, attention was first directed to the patella.  Precut   measurement was noted to be 26 mm.  I resected down to 15 mm and used a   38 anatomic patellar button to restore patellar height as well as cover the cut surface.      The lug holes were drilled and a metal shim was placed to protect the   patella from retractors and saw blade during the procedure.      At this point, attention was now directed to the femur.  The femoral   canal was opened with a drill, irrigated to try to prevent fat emboli.  An   intramedullary rod was passed at 3 degrees valgus, 10 mm of bone was   resected off the distal femur due to pre-operative flexion contracture.  Following this resection, the tibia was   subluxated anteriorly.  Using the extramedullary guide, 2 mm of bone was resected off   the proximal medial tibia.  We confirmed the gap would be   stable medially and laterally with a size 5 spacer block as well as confirmed that the tibial cut was perpendicular in the coronal plane, checking with an  alignment rod.      Once this was done, I sized the femur to be a size 4 in the anterior-   posterior dimension, chose a standard component based on medial and   lateral dimension.  The size 4 rotation block was then pinned in   position anterior referenced using the C-clamp to set rotation.  The   anterior, posterior, and  chamfer cuts were made without difficulty nor   notching making certain that I was along the anterior cortex to help   with flexion gap stability.      The final box cut was made off the lateral aspect of distal femur.      At this point, the tibia was sized to be a size 5.  The size 5 tray was   then pinned in position through the medial third of the tubercle,   drilled, and keel punched.  Trial reduction was now carried with a 4 femur,  5 tibia, a size 5 mm PS insert, and the 38 anatomic patella botton.  The knee was brought to full extension with good flexion stability with the patella   tracking through the trochlea without application of pressure.  Given   all these findings the trial components removed.  Final components were   opened and cement was mixed.  The knee was irrigated with normal saline solution and pulse lavage.  The synovial lining was   then injected with 30 cc of 0.25% Marcaine with epinephrine, 1 cc of Toradol and 30 cc of NS for a total of 61 cc.     Final implants were then cemented onto cleaned and dried cut surfaces of bone with the knee brought to extension with a size 5 mm PS trial insert.      Once the cement had fully cured, excess cement was removed   throughout the knee.  I confirmed that I was satisfied with the range of   motion and stability, and the final size 5 mm PS AOX insert was chosen.  It was   placed into the knee.      The tourniquet had been let down at 37 minutes.  No significant   hemostasis was required.  The extensor mechanism was then  reapproximated using #1 Vicryl and #1 Stratafix sutures with the knee   in flexion.   The   remaining wound was closed with 2-0 Vicryl and running 4-0 Monocryl.   The knee was cleaned, dried, dressed sterilely using Dermabond and   Aquacel dressing.  The patient was then   brought to recovery room in stable condition, tolerating the procedure   well.   Please note that Physician Assistant, Danae Orleans, PA-C was present for the entirety of the case, and was utilized for pre-operative positioning, peri-operative retractor management, general facilitation of the procedure and for primary wound closure at the end of the case.              Pietro Cassis Alvan Dame, M.D.    02/25/2018 9:06 AM

## 2018-02-25 NOTE — Interval H&P Note (Signed)
History and Physical Interval Note:  02/25/2018 7:37 AM  [name] Patient  has presented today for surgery, with the diagnosis of Left knee osteoarthritis  The various methods of treatment have been discussed with the patient and family. After consideration of risks, benefits and other options for treatment, the patient has consented to  Procedure(s) with comments: LEFT TOTAL KNEE ARTHROPLASTY (Left) - 70 mins as a surgical intervention .  The patient's history has been reviewed, patient examined, no change in status, stable for surgery.  I have reviewed the patient's chart and labs.  Questions were answered to the patient's satisfaction.     Mauri Pole

## 2018-02-25 NOTE — Anesthesia Procedure Notes (Signed)
Anesthesia Procedure Image    

## 2018-02-25 NOTE — Evaluation (Signed)
Physical Therapy Evaluation Patient Details Name: [name] MRN: [id number] DOB: Nov 24, 1945 Today's Date: 02/25/2018   History of Present Illness  LTKA, multiple back/ neck surgeries  Clinical Impression  The patient ambulated x 120' today. Patient reports plans for OP PT at [facility]  But does not have appointment. Pt admitted with above diagnosis. Pt currently with functional limitations due to the deficits listed below (see PT Problem List). Pt will benefit from skilled PT to increase their independence and safety with mobility to allow discharge to the venue listed below.       Follow Up Recommendations Follow surgeon's recommendation for DC plan and follow-up therapies;Outpatient PT    Equipment Recommendations  None recommended by PT    Recommendations for Other Services       Precautions / Restrictions Precautions Precautions: Fall;Knee      Mobility  Bed Mobility Overal bed mobility: Needs Assistance Bed Mobility: Supine to Sit     Supine to sit: Min assist     General bed mobility comments: support left leg  Transfers Overall transfer level: Needs assistance Equipment used: Rolling walker (2 wheeled) Transfers: Sit to/from Stand Sit to Stand: Min assist         General transfer comment: cues for hand and left leg position  Ambulation/Gait Ambulation/Gait assistance: Min assist Ambulation Distance (Feet): 120 Feet Assistive device: Rolling walker (2 wheeled) Gait Pattern/deviations: Step-to pattern;Step-through pattern     General Gait Details: cues fopr sequence  Stairs            Wheelchair Mobility    Modified Rankin (Stroke Patients Only)       Balance                                             Pertinent Vitals/Pain Pain Assessment: 0-10 Pain Score: 5  Pain Location: left knee Pain Descriptors / Indicators: Discomfort;Tightness Pain Intervention(s): Ice applied;Monitored during session;Premedicated  before session    Home Living Family/patient expects to be discharged to:: Private residence Living Arrangements: Spouse/significant other Available Help at Discharge: Family Type of Home: House Home Access: Stairs to enter Entrance Stairs-Rails: None Technical brewer of Steps: 2 Home Layout: Multi-level Home Equipment: Environmental consultant - 2 wheels Additional Comments: wife unable to assist, daughter, RN to assist    Prior Function Level of Independence: Independent               Hand Dominance        Extremity/Trunk Assessment   Upper Extremity Assessment Upper Extremity Assessment: Overall WFL for tasks assessed    Lower Extremity Assessment Lower Extremity Assessment: LLE deficits/detail LLE Deficits / Details: able to  perform SLR    Cervical / Trunk Assessment Cervical / Trunk Assessment: Normal  Communication   Communication: No difficulties  Cognition Arousal/Alertness: Awake/alert Behavior During Therapy: WFL for tasks assessed/performed Overall Cognitive Status: Within Functional Limits for tasks assessed                                        General Comments      Exercises Total Joint Exercises Ankle Circles/Pumps: AROM;Both;10 reps Quad Sets: AROM;Both;10 reps   Assessment/Plan    PT Assessment Patient needs continued PT services  PT Problem List Decreased strength;Decreased range of motion;Decreased knowledge  of use of DME;Decreased activity tolerance;Decreased safety awareness;Decreased knowledge of precautions;Decreased mobility;Pain       PT Treatment Interventions DME instruction;Gait training;Stair training;Functional mobility training;Patient/family education;Therapeutic activities;Therapeutic exercise    PT Goals (Current goals can be found in the Care Plan section)  Acute Rehab PT Goals Patient Stated Goal: garden PT Goal Formulation: With patient/family Time For Goal Achievement: 02/28/18 Potential to Achieve  Goals: Good    Frequency 7X/week   Barriers to discharge        Co-evaluation               AM-PAC PT "6 Clicks" Daily Activity  Outcome Measure Difficulty turning over in bed (including adjusting bedclothes, sheets and blankets)?: A Little Difficulty moving from lying on back to sitting on the side of the bed? : A Little Difficulty sitting down on and standing up from a chair with arms (e.g., wheelchair, bedside commode, etc,.)?: A Little Help needed moving to and from a bed to chair (including a wheelchair)?: A Little Help needed walking in hospital room?: A Lot Help needed climbing 3-5 steps with a railing? : A Lot 6 Click Score: 16    End of Session Equipment Utilized During Treatment: Gait belt Activity Tolerance: Patient tolerated treatment well Patient left: in chair;with call bell/phone within reach;with chair alarm set Nurse Communication: Mobility status PT Visit Diagnosis: Unsteadiness on feet (R26.81);Pain Pain - Right/Left: Left Pain - part of body: Knee    Time: 1535-1600 PT Time Calculation (min) (ACUTE ONLY): 25 min   Charges:   PT Evaluation $PT Eval Low Complexity: 1 Low PT Treatments $Gait Training: 8-22 mins   PT G CodesTresa Endo PT [phone]   Claretha Cooper 02/25/2018, 5:48 PM

## 2018-02-25 NOTE — Anesthesia Procedure Notes (Signed)
Procedure Name: LMA Insertion Date/Time: 02/25/2018 7:52 AM Performed by: West Pugh, CRNA Pre-anesthesia Checklist: Patient identified, Emergency Drugs available, Suction available, Patient being monitored and Timeout performed Patient Re-evaluated:Patient Re-evaluated prior to induction Oxygen Delivery Method: Circle system utilized Preoxygenation: Pre-oxygenation with 100% oxygen Induction Type: IV induction Ventilation: Mask ventilation without difficulty LMA: LMA with gastric port inserted LMA Size: 5.0 Number of attempts: 1 Placement Confirmation: positive ETCO2 and breath sounds checked- equal and bilateral Tube secured with: Tape Dental Injury: Teeth and Oropharynx as per pre-operative assessment  Comments: Head, neck and spine maintained in neutral alignment during placement of LMA.

## 2018-02-25 NOTE — Discharge Instructions (Signed)

## 2018-02-25 NOTE — Anesthesia Procedure Notes (Signed)
Anesthesia Regional Block: Adductor canal block   Pre-Anesthetic Checklist: ,, timeout performed, Correct Patient, Correct Site, Correct Laterality, Correct Procedure, Correct Position, site marked, Risks and benefits discussed,  Surgical consent,  Pre-op evaluation,  At surgeon's request and post-op pain management  Laterality: Left  Prep: chloraprep       Needles:  Injection technique: Single-shot  Needle Type: Echogenic Needle     Needle Length: 9cm      Additional Needles:   Procedures:,,,, ultrasound used (permanent image in chart),,,,  Narrative:  Start time: 02/25/2018 7:01 AM End time: 02/25/2018 7:09 AM Injection made incrementally with aspirations every 5 mL.  Performed by: Personally   Additional Notes: Patient tolerated the procedure well without complications

## 2018-02-25 NOTE — Transfer of Care (Signed)
Immediate Anesthesia Transfer of Care Note  Patient: [name]  Procedure(s) Performed: LEFT TOTAL KNEE ARTHROPLASTY (Left Knee)  Patient Location: PACU  Anesthesia Type:General  Level of Consciousness: awake  Airway & Oxygen Therapy: Patient Spontanous Breathing and Patient connected to face mask oxygen  Post-op Assessment: Report given to RN and Post -op Vital signs reviewed and stable  Post vital signs: Reviewed and stable  Last Vitals:  Vitals Value Taken Time  BP    Temp    Pulse 65 02/25/2018  9:38 AM  Resp 22 02/25/2018  9:38 AM  SpO2 100 % 02/25/2018  9:38 AM  Vitals shown include unvalidated device data.  Last Pain:  Vitals:   02/25/18 0608  TempSrc:   PainSc: 5       Patients Stated Pain Goal: 4 (46/80/32 1224)  Complications: No apparent anesthesia complications

## 2018-02-25 NOTE — Anesthesia Postprocedure Evaluation (Signed)
Anesthesia Post Note  Patient: Wisdom R Boylen  Procedure(s) Performed: LEFT TOTAL KNEE ARTHROPLASTY (Left Knee)     Patient location during evaluation: PACU Anesthesia Type: General Level of consciousness: awake and alert Pain management: pain level controlled Vital Signs Assessment: post-procedure vital signs reviewed and stable Respiratory status: spontaneous breathing, nonlabored ventilation, respiratory function stable and patient connected to nasal cannula oxygen Cardiovascular status: blood pressure returned to baseline and stable Postop Assessment: no apparent nausea or vomiting Anesthetic complications: no    Last Vitals:  Vitals:   02/25/18 1015 02/25/18 1030  BP: (!) 151/77 (!) 147/73  Pulse: 73 66  Resp: 17 15  Temp:    SpO2: 97% 97%    Last Pain:  Vitals:   02/25/18 1030  TempSrc:   PainSc: Asleep    LLE Motor Response: Purposeful movement (02/25/18 1030) LLE Sensation: Full sensation (02/25/18 1030) RLE Motor Response: Purposeful movement (02/25/18 1030) RLE Sensation: Full sensation (02/25/18 1030)      Lovel Suazo S

## 2018-02-26 DIAGNOSIS — M1712 Unilateral primary osteoarthritis, left knee: Secondary | ICD-10-CM | POA: Diagnosis not present

## 2018-02-26 LAB — CBC
HEMATOCRIT: 34.7 % — AB (ref 39.0–52.0)
HEMOGLOBIN: 11.8 g/dL — AB (ref 13.0–17.0)
MCH: 31.1 pg (ref 26.0–34.0)
MCHC: 34 g/dL (ref 30.0–36.0)
MCV: 91.3 fL (ref 78.0–100.0)
Platelets: 148 10*3/uL — ABNORMAL LOW (ref 150–400)
RBC: 3.8 MIL/uL — AB (ref 4.22–5.81)
RDW: 13.5 % (ref 11.5–15.5)
WBC: 13.8 10*3/uL — AB (ref 4.0–10.5)

## 2018-02-26 LAB — BASIC METABOLIC PANEL
ANION GAP: 9 (ref 5–15)
BUN: 21 mg/dL — AB (ref 6–20)
CHLORIDE: 105 mmol/L (ref 101–111)
CO2: 25 mmol/L (ref 22–32)
Calcium: 8.8 mg/dL — ABNORMAL LOW (ref 8.9–10.3)
Creatinine, Ser: 0.96 mg/dL (ref 0.61–1.24)
GFR calc non Af Amer: >60 mL/min (ref >60)
Glucose, Bld: 176 mg/dL — ABNORMAL HIGH (ref 65–99)
POTASSIUM: 4.3 mmol/L (ref 3.5–5.1)
SODIUM: 139 mmol/L (ref 135–145)

## 2018-02-26 LAB — GLUCOSE, CAPILLARY: Glucose-Capillary: 168 mg/dL — ABNORMAL HIGH (ref 65–99)

## 2018-02-26 NOTE — Progress Notes (Signed)
     Subjective: 1 Day Post-Op Procedure(s) (LRB): LEFT TOTAL KNEE ARTHROPLASTY (Left)   Patient reports pain as mild, pain controlled.  No events throughout the night.  Looking forward to progressing with PT.    Patient's anticipated LOS is less than 2 midnights, meeting these requirements: - Lives within 1 hour of care - Has a competent adult at home to recover with post-op recover - NO history of  - Chronic pain requiring opiods  - Diabetes  - Heart failure  - Heart attack  - Stroke  - DVT/VTE  - Cardiac arrhythmia  - Respiratory Failure/COPD  - Renal failure  - Anemia  - Advanced Liver disease       Objective:   VITALS:   Vitals:   02/26/18 0632 02/26/18 0910  BP: (!) 155/73 (!) 162/74  Pulse: 66 74  Resp: 17 14  Temp: 98.3 F (36.8 C) 98.1 F (36.7 C)  SpO2: 96% 98%    Dorsiflexion/Plantar flexion intact Incision: dressing C/D/I No cellulitis present Compartment soft  LABS Recent Labs    02/26/18 0551  HGB 11.8*  HCT 34.7*  WBC 13.8*  PLT 148*    Recent Labs    02/26/18 0551  NA 139  K 4.3  BUN 21*  CREATININE 0.96  GLUCOSE 176*     Assessment/Plan: 1 Day Post-Op Procedure(s) (LRB): LEFT TOTAL KNEE ARTHROPLASTY (Left) Foley cath d/c'ed Advance diet Up with therapy D/C IV fluids Discharge home Follow up in 2 weeks [facility] Follow up with OLIN,Neville Pauls D in 2 weeks.  Contact information:  [facility]) [address] [place] [phone]    Obese (BMI 30-39.9) Estimated body mass index is 31.16 kg/m as calculated from the following:   Height as of this encounter: 5\' 9"  (1.753 m).   Weight as of this encounter: 95.7 kg (211 lb). Patient also counseled that weight may inhibit the healing process Patient counseled that losing weight will help with future health issues       West Pugh. Dinna Severs   PAC  02/26/2018, 9:58 AM

## 2018-02-26 NOTE — Progress Notes (Signed)
Patient and family educated on discharge instructions. No questions or concerns.

## 2018-02-26 NOTE — Progress Notes (Signed)
Physical Therapy Treatment Patient Details Name: [name] MRN: [id number] DOB: May 01, 1946 Today's Date: 02/26/2018    History of Present Illness LTKA    PT Comments    Pt progressing well; recommend close supervision/assist for OOB/mobility at home;   Follow Up Recommendations  Follow surgeon's recommendation for DC plan and follow-up therapies;Outpatient PT(OP at [facility]--does not have appt per pt)     Equipment Recommendations  None recommended by PT    Recommendations for Other Services       Precautions / Restrictions Precautions Precautions: Fall;Knee Restrictions Weight Bearing Restrictions: No    Mobility  Bed Mobility               General bed mobility comments: in recliner on arrival  Transfers Overall transfer level: Needs assistance Equipment used: Rolling walker (2 wheeled) Transfers: Sit to/from Stand Sit to Stand: Min guard         General transfer comment: cues for hand and left leg position and overall safety  Ambulation/Gait Ambulation/Gait assistance: Min guard Ambulation Distance (Feet): 75 Feet Assistive device: Rolling walker (2 wheeled) Gait Pattern/deviations: Step-to pattern;Step-through pattern     General Gait Details: cues for sequence   Stairs Stairs: Yes Stairs assistance: Min assist Stair Management: No rails;Step to pattern;Forwards;With walker Number of Stairs: 1(x2) General stair comments: cues for safety and technique   Wheelchair Mobility    Modified Rankin (Stroke Patients Only)       Balance                                            Cognition Arousal/Alertness: Awake/alert Behavior During Therapy: WFL for tasks assessed/performed Overall Cognitive Status: Within Functional Limits for tasks assessed                                        Exercises Total Joint Exercises Ankle Circles/Pumps: AROM;Both;10 reps Quad Sets: AROM;Both;10 reps Heel Slides:  AAROM;Left;10 reps Straight Leg Raises: AAROM;AROM;Left;10 reps Goniometric ROM: ~ 8* to 65* AAROM left knee flexion    General Comments        Pertinent Vitals/Pain Pain Assessment: 0-10 Pain Score: 2  Pain Location: left knee Pain Descriptors / Indicators: Discomfort;Tightness Pain Intervention(s): Monitored during session;Ice applied    Home Living                      Prior Function            PT Goals (current goals can now be found in the care plan section) Acute Rehab PT Goals Patient Stated Goal: garden PT Goal Formulation: With patient/family Time For Goal Achievement: 02/28/18 Potential to Achieve Goals: Good Progress towards PT goals: Progressing toward goals    Frequency    7X/week      PT Plan Current plan remains appropriate    Co-evaluation              AM-PAC PT "6 Clicks" Daily Activity  Outcome Measure  Difficulty turning over in bed (including adjusting bedclothes, sheets and blankets)?: A Little Difficulty moving from lying on back to sitting on the side of the bed? : A Little Difficulty sitting down on and standing up from a chair with arms (e.g., wheelchair, bedside commode, etc,.)?: A Little Help needed moving  to and from a bed to chair (including a wheelchair)?: A Little Help needed walking in hospital room?: A Little Help needed climbing 3-5 steps with a railing? : A Little 6 Click Score: 18    End of Session Equipment Utilized During Treatment: Gait belt Activity Tolerance: Patient tolerated treatment well Patient left: in chair;with call bell/phone within reach;with chair alarm set Nurse Communication: Mobility status PT Visit Diagnosis: Unsteadiness on feet (R26.81);Pain Pain - Right/Left: Left Pain - part of body: Knee     Time: 6440-3474 PT Time Calculation (min) (ACUTE ONLY): 23 min  Charges:  $Gait Training: 8-22 mins $Therapeutic Exercise: 8-22 mins                    G CodesKenyon Ana,  PT Pager: [phone] 02/26/2018    [facility] 02/26/2018, 11:42 AM

## 2018-02-26 NOTE — Progress Notes (Signed)
Spoke with patient at bedside. Confirmed plan for OP PT, plans to arrange at [facility]. Has RW and 3n1. [phone]

## 2018-02-28 DIAGNOSIS — E669 Obesity, unspecified: Secondary | ICD-10-CM | POA: Diagnosis present

## 2018-03-03 NOTE — Discharge Summary (Signed)
Physician Discharge Summary  Patient ID: [name] MRN: [id number] DOB/AGE: [birth year] 72 y.o.  Admit date: 02/25/2018 Discharge date: 02/26/2018   Procedures:  Procedure(s) (LRB): LEFT TOTAL KNEE ARTHROPLASTY (Left)  Attending Physician:  Dr. Paralee Cancel   Admission Diagnoses:   Left knee primary OA / pain  Discharge Diagnoses:  Principal Problem:   S/P left TKA Active Problems:   S/P total knee replacement   Obese  Past Medical History:  Diagnosis Date  . Bilateral renal cysts   . Bladder cancer ([place]) UROLOGIST-  DR [facility]   RECURRENT 11/ 2017 --  hx turbt in 2004 and 08-19-2013  . BPH without obstruction/lower urinary tract symptoms   . Cervical fusion syndrome    LEFT ARM NUMBNESS--  CONTROLLED W/ GABAPENTIN  . Coronary artery disease    CARDIOLOGIST-- DR BRANCH [name])  . DDD (degenerative disc disease), cervical   . DDD (degenerative disc disease), lumbar   . Diverticulosis of sigmoid colon   . History of acute gouty arthritis   . History of adenomatous polyp of colon    2001;  2012- non-malignant leiomyoma;  04-07-2014  tubualr adenoma and hyperplastic polyp's  . History of prostate cancer UROLOGIST-  DR [facility]   S/P  RADIOACTIVE SEED IMPLANTS 2004  . HOH (hard of hearing)    no hearing aids  . Hyperlipidemia   . Hypertension   . OA (osteoarthritis)   . Occlusion and stenosis of carotid artery without mention of cerebral infarction CARDIOLOGIST  -- DR  [name]   LAST DUPLEX  11-23-2015  RICA  (DUPLEX DOPPLER 01-20-2016 RICA 40-98% PROXIMAL) /   LICA <11% (HAD CONSULT W/ [name] , VASCULAR SURGEON 01-20-2016)  . OSA on CPAP    BiPAP  . Spondylosis, cervical   . Type 2 diabetes mellitus ([place])   . Wears glasses     HPI:     [name], 72 y.o. male, has a history of pain and functional disability in the left knee due to arthritis and has failed non-surgical conservative treatments for greater than 12 weeks to includeNSAID's  and/or analgesics, corticosteriod injections, viscosupplementation injections and activity modification.  Onset of symptoms was gradual, starting ~1 years ago with gradually worsening course since that time. The patient noted prior procedures on the knee to include  arthroscopy on the left knee(s).  Patient currently rates pain in the left knee(s) at 9 out of 10 with activity. Patient has night pain, worsening of pain with activity and weight bearing, pain that interferes with activities of daily living, pain with passive range of motion, crepitus and joint swelling.  Patient has evidence of periarticular osteophytes and joint space narrowing by imaging studies. There is no active infection.  Risks, benefits and expectations were discussed with the patient.  Risks including but not limited to the risk of anesthesia, blood clots, nerve damage, blood vessel damage, failure of the prosthesis, infection and up to and including death.  Patient understand the risks, benefits and expectations and wishes to proceed with surgery.   PCP: [name], MD   Discharged Condition: good  Hospital Course:  Patient underwent the above stated procedure on 02/25/2018. Patient tolerated the procedure well and brought to the recovery room in good condition and subsequently to the floor.  POD #1 BP: 162/74 ; Pulse: 74 ; Temp: 98.1 F (36.7 C) ; Resp: 14 Patient reports pain as mild, pain controlled.  No events throughout the night.  Looking forward  to progressing with PT.  Ready to e discharged home.  Dorsiflexion/plantar flexion intact, incision: dressing C/D/I, no cellulitis present and compartment soft.   LABS  Basename    HGB     11.8  HCT     34.7    Discharge Exam: General appearance: alert, cooperative and no distress Extremities: Homans sign is negative, no sign of DVT, no edema, redness or tenderness in the calves or thighs and no ulcers, gangrene or trophic changes  Disposition:  Home with follow up in 2  weeks   Follow-up Information    Paralee Cancel, MD. Schedule an appointment as soon as possible for a visit in 2 weeks.   Specialty:  Orthopedic Surgery Contact information: [address] [place] [address] [phone]           Discharge Instructions    Call MD / Call 911   Complete by:  As directed    If you experience chest pain or shortness of breath, CALL 911 and be transported to the hospital emergency room.  If you develope a fever above 101 F, pus (white drainage) or increased drainage or redness at the wound, or calf pain, call your surgeon's office.   Change dressing   Complete by:  As directed    Maintain surgical dressing until follow up in the clinic. If the edges start to pull up, may reinforce with tape. If the dressing is no longer working, may remove and cover with gauze and tape, but must keep the area dry and clean.  Call with any questions or concerns.   Constipation Prevention   Complete by:  As directed    Drink plenty of fluids.  Prune juice may be helpful.  You may use a stool softener, such as Colace (over the counter) 100 mg twice a day.  Use MiraLax (over the counter) for constipation as needed.   Diet - low sodium heart healthy   Complete by:  As directed    Discharge instructions   Complete by:  As directed    Maintain surgical dressing until follow up in the clinic. If the edges start to pull up, may reinforce with tape. If the dressing is no longer working, may remove and cover with gauze and tape, but must keep the area dry and clean.  Follow up in 2 weeks at [facility] Call with any questions or concerns.   Increase activity slowly as tolerated   Complete by:  As directed    Weight bearing as tolerated with assist device (walker, cane, etc) as directed, use it as long as suggested by your surgeon or therapist, typically at least 4-6 weeks.   TED hose   Complete by:  As directed    Use stockings (TED hose) for 2  weeks on both leg(s).  You may remove them at night for sleeping.      Allergies as of 02/26/2018      Reactions   Dilaudid [hydromorphone Hcl] Other (See Comments)   "acts a little off"   Toradol [ketorolac Tromethamine] Other (See Comments)   CauseD  hallucination ?-states not sure/ avoids per his MD   Lyrica [pregabalin] Nausea Only, Other (See Comments)   Make me feel "bad"      Medication List    STOP taking these medications   acetaminophen 500 MG tablet Commonly known as:  TYLENOL   ASPERCREME LIDOCAINE EX     TAKE these medications   allopurinol 300 MG tablet Commonly  known as:  ZYLOPRIM Take 300 mg by mouth at bedtime.   amLODipine 10 MG tablet Commonly known as:  NORVASC TAKE 1 TABLET BY MOUTH EVERY DAY   aspirin 81 MG EC tablet Take 81 mg by mouth at bedtime.   celecoxib 200 MG capsule Commonly known as:  CELEBREX Take 200 mg by mouth 2 (two) times daily.   clopidogrel 75 MG tablet Commonly known as:  PLAVIX Take 75 mg by mouth daily.   colchicine 0.6 MG tablet Take 0.6 mg by mouth 2 (two) times daily as needed (gout).   docusate sodium 100 MG capsule Commonly known as:  COLACE Take 200 mg by mouth at bedtime.   dutasteride 0.5 MG capsule Commonly known as:  AVODART Take 0.5 mg by mouth at bedtime.   ferrous sulfate 325 (65 FE) MG tablet Commonly known as:  FERROUSUL Take 1 tablet (325 mg total) by mouth 3 (three) times daily with meals.   finasteride 5 MG tablet Commonly known as:  PROSCAR Take 5 mg by mouth at bedtime.   Fish Oil 1200 MG Caps Take 1,200 mg by mouth 2 (two) times daily.   furosemide 40 MG tablet Commonly known as:  LASIX Take 40 mg by mouth every morning.   gabapentin 300 MG capsule Commonly known as:  NEURONTIN Take 300 mg by mouth 3 (three) times daily.   HYDROcodone-acetaminophen 7.5-325 MG tablet Commonly known as:  NORCO Take 1-2 tablets by mouth every 4 (four) hours as needed for moderate pain.   insulin  detemir 100 UNIT/ML injection Commonly known as:  LEVEMIR Inject 25 Units into the skin at bedtime.   labetalol 300 MG tablet Commonly known as:  NORMODYNE Take 300 mg by mouth 2 (two) times daily.   losartan 100 MG tablet Commonly known as:  COZAAR TAKE 1 TABLET BY MOUTH DAILY   methocarbamol 500 MG tablet Commonly known as:  ROBAXIN Take 1 tablet (500 mg total) by mouth every 6 (six) hours as needed for muscle spasms.   polyethylene glycol powder powder Commonly known as:  GLYCOLAX/MIRALAX TAKE 17MG (1CAPFUL) BY MOUTH(MIXED WITH WATER/JUICE) DAILY What changed:  See the new instructions.   rosuvastatin 10 MG tablet Commonly known as:  CRESTOR Take 10 mg by mouth every morning.            Discharge Care Instructions  (From admission, onward)        Start     Ordered   02/26/18 0000  Change dressing    Comments:  Maintain surgical dressing until follow up in the clinic. If the edges start to pull up, may reinforce with tape. If the dressing is no longer working, may remove and cover with gauze and tape, but must keep the area dry and clean.  Call with any questions or concerns.   02/26/18 [id number]       Signed: West Pugh. Cortney Mckinney   PA-C  03/03/2018, 1:09 PM

## 2018-03-04 ENCOUNTER — Other Ambulatory Visit: Payer: Self-pay

## 2018-03-04 ENCOUNTER — Ambulatory Visit (HOSPITAL_COMMUNITY): Payer: Medicare HMO | Attending: Orthopedic Surgery

## 2018-03-04 ENCOUNTER — Encounter (HOSPITAL_COMMUNITY): Payer: Self-pay

## 2018-03-04 DIAGNOSIS — M25662 Stiffness of left knee, not elsewhere classified: Secondary | ICD-10-CM | POA: Insufficient documentation

## 2018-03-04 DIAGNOSIS — R262 Difficulty in walking, not elsewhere classified: Secondary | ICD-10-CM | POA: Insufficient documentation

## 2018-03-04 DIAGNOSIS — M6281 Muscle weakness (generalized): Secondary | ICD-10-CM | POA: Diagnosis not present

## 2018-03-04 DIAGNOSIS — R600 Localized edema: Secondary | ICD-10-CM | POA: Diagnosis not present

## 2018-03-04 NOTE — Therapy (Signed)
[facility] [place], [place], [address] Phone: [phone]   Fax:  [phone]  Physical Therapy Evaluation  Patient Details  Name: [name] MRN: [id number] Date of Birth: [birth year] Referring Provider: Paralee Cancel, MD   Encounter Date: 03/04/2018  PT End of Session - 03/04/18 1345    Visit Number  1    Number of Visits  19    Date for PT Re-Evaluation  04/15/18 mini reassess on 03/25/18    Authorization Type  [employer]    Authorization Time Period  03/04/18 to 04/15/18    Authorization - Visit Number  1    Authorization - Number of Visits  10    PT Start Time  1300    PT Stop Time  1343    PT Time Calculation (min)  43 min    Activity Tolerance  Patient tolerated treatment well    Behavior During Therapy  [facility] for tasks assessed/performed       Past Medical History:  Diagnosis Date  . Bilateral renal cysts   . Bladder cancer ([place]) UROLOGIST-  DR [facility]   RECURRENT 11/ 2017 --  hx turbt in 2004 and 08-19-2013  . BPH without obstruction/lower urinary tract symptoms   . Cervical fusion syndrome    LEFT ARM NUMBNESS--  CONTROLLED W/ GABAPENTIN  . Coronary artery disease    CARDIOLOGIST-- DR BRANCH [name])  . DDD (degenerative disc disease), cervical   . DDD (degenerative disc disease), lumbar   . Diverticulosis of sigmoid colon   . History of acute gouty arthritis   . History of adenomatous polyp of colon    2001;  2012- non-malignant leiomyoma;  04-07-2014  tubualr adenoma and hyperplastic polyp's  . History of prostate cancer UROLOGIST-  DR [facility]   S/P  RADIOACTIVE SEED IMPLANTS 2004  . HOH (hard of hearing)    no hearing aids  . Hyperlipidemia   . Hypertension   . OA (osteoarthritis)   . Occlusion and stenosis of carotid artery without mention of cerebral infarction CARDIOLOGIST  -- DR  [name]   LAST DUPLEX  11-23-2015  RICA  (DUPLEX DOPPLER 01-20-2016 RICA 95-18% PROXIMAL) /    LICA <84% (HAD CONSULT W/ [name] , VASCULAR SURGEON 01-20-2016)  . OSA on CPAP    BiPAP  . Spondylosis, cervical   . Type 2 diabetes mellitus ([place])   . Wears glasses     Past Surgical History:  Procedure Laterality Date  . ANTERIOR REMOVAL CAGE AND PLATE C3-C7/ CORPECTOMY C7 (FX)/  ALLOGRAFT AND FUSION C3 -- T1/  POSTERIOR DECOMPRESSION BILATERAL LAMINECTOMY C4 -- 6 & PARTIAL C3/  POSTEROLATERAL ARTHRODESIS C3 - T1  06/05/2005  . APPENDECTOMY  1990'S  . CATARACT EXTRACTION W/ INTRAOCULAR LENS  IMPLANT, BILATERAL  2013  . CERVICAL FUSION  05/11/2005   C3  --  C7/  due to post op quadriparesis same day s/p  anterior C 4,5,6, colpectomy, decompression, removal epidural hematoma, foraminnotomy, cage and plate  . COLONOSCOPY  last one 04-07-2014  . CYSTOSCOPY W/ RETROGRADES Bilateral 08/19/2013   Procedure: CYSTOSCOPY WITH RETROGRADE PYELOGRAM;  Surgeon: Molli Hazard, MD;  [facility];  Service: Urology;  Laterality: Bilateral;  . CYSTOSCOPY W/ RETROGRADES Bilateral 07/18/2016   Procedure: CYSTOSCOPY WITH RETROGRADE PYELOGRAM;  Surgeon: Alexis Frock, MD;  Location: [facility];  Service: Urology;  Laterality: Bilateral;  . ENDOSCOPIC REPAIR CSF LEAK VIA NASAL PASSAGE  2011  . INTRAOPERATIVE ARTERIOGRAM  CATH LAB  01-05-2009  DR Einar Gip   RICA   ACUTE ANGLE 80-85% STENOSIS  . KNEE ARTHROSCOPY Left 07/15/2017   Procedure: ARTHROSCOPY LEFT  KNEE AND DEBRIDEMENT, medial meniscal tear and chondromalaita;  Surgeon: Paralee Cancel, MD;  Location: [facility];  Service: Orthopedics;  Laterality: Left;  60 MINS  . LUMBAR FUSION  03-26-2012   L3 --  L5  . RADIOACTIVE PROSTATE SEED IMPLANTS  08-19-2003  . SEPTOPLASTY  1998  . TOTAL KNEE ARTHROPLASTY Left 02/25/2018   Procedure: LEFT TOTAL KNEE ARTHROPLASTY;  Surgeon: Paralee Cancel, MD;  Location: [facility];  Service: Orthopedics;  Laterality: Left;  70 mins  . TRANSURETHRAL RESECTION OF BLADDER TUMOR N/A 08/19/2013    Procedure: TRANSURETHRAL RESECTION OF BLADDER TUMOR (TURBT);  Surgeon: Molli Hazard, MD;  Location: [facility];  Service: Urology;  Laterality: N/A;  . TRANSURETHRAL RESECTION OF BLADDER TUMOR N/A 07/18/2016   Procedure: TRANSURETHRAL RESECTION OF BLADDER TUMOR (TURBT);  Surgeon: Alexis Frock, MD;  Location: [facility];  Service: Urology;  Laterality: N/A;  . YAG LASER APPLICATION  34/19/3790   Procedure: YAG LASER APPLICATION;  Surgeon: Elta Guadeloupe T. Gershon Crane, MD;  Location: [facility];  Service: Ophthalmology;  Laterality: Right;    There were no vitals filed for this visit.   Subjective Assessment - 03/04/18 1306    Subjective  Pt reports undergiong L TKA on 02/25/18 by Dr. Paralee Cancel. He did not have any HHPT following his hospital discharge. He has L knee pain if he tries to do too much or does his exercises. He has the most difficulty bending his knee. He reports having a stiff knee stating that he had difficulty bending it prior to the TKA. PLOF he would very infrequently use a rollator but for the most part he did not use an AD prior to TKA. He wants to get back to house, yard, garden, and antique furniture work.     Limitations  Standing;Walking;House hold activities    How long can you sit comfortably?  40min in recliner; 15 mins with legs in dependent position    How long can you stand comfortably?  hasn't tested it, probably 15 mins    How long can you walk comfortably?  has only been walking in the house    Patient Stated Goals  get better in a hurry    Pain Score  6     Pain Location  Knee    Pain Orientation  Left    Pain Descriptors / Indicators  Aching;Dull    Pain Type  Surgical pain    Pain Onset  1 to 4 weeks ago    Pain Frequency  Constant    Aggravating Factors   bending it, being on it too long    Pain Relieving Factors  rest, pain meds    Effect of Pain on Daily Activities  increases         [facility] PT Assessment - 03/04/18 0001       Assessment   Medical Diagnosis  L TKA     Referring Provider  Paralee Cancel, MD    Onset Date/Surgical Date  02/25/18    Next MD Visit  6/27 or 03/14/18    Prior Therapy  none for current TKA, has had PT in past for neck and back surgeries      Balance Screen   Has the patient fallen in the past 6 months  No  Has the patient had a decrease in activity level because of a fear of falling?   No    Is the patient reluctant to leave their home because of a fear of falling?   No      Prior Function   Level of Independence  Independent    Vocation  Retired    Leisure  house/yard/garden work, Programmer, multimedia work      Cognition   Overall Cognitive Status  Within [employer] for tasks assessed      Observation/Other Assessments   Focus on Therapeutic Outcomes (FOTO)   67% limitation      Observation/Other Assessments-Edema    Edema  Circumferential      Circumferential Edema   Circumferential - Right  39cm joint line    Circumferential - Left   43.5cm joint line      ROM / Strength   AROM / PROM / Strength  AROM;Strength      AROM   AROM Assessment Site  Knee    Right/Left Knee  Left    Left Knee Extension  20    Left Knee Flexion  78      Strength   Strength Assessment Site  Hip;Knee;Ankle    Right Hip Flexion  5/5    Right Hip Extension  3+/5    Right Hip ABduction  4+/5    Left Hip Flexion  4+/5    Left Hip Extension  4-/5    Left Hip ABduction  4/5    Right Knee Flexion  5/5    Right Knee Extension  5/5    Left Knee Flexion  4/5 through available range    Left Knee Extension  3/5 through available range    Right Ankle Dorsiflexion  5/5    Left Ankle Dorsiflexion  5/5      Palpation   Patella mobility  hypomobile throughout      Ambulation/Gait   Ambulation Distance (Feet)  290 Feet 3MWT    Assistive device  Rolling walker    Gait Pattern  Step-through pattern;Decreased stance time - left;Decreased stride length;Decreased hip/knee flexion -  left;Antalgic;Trendelenburg;Lateral trunk lean to right;Decreased weight shift to left increased knee flexion during midstance      Balance   Balance Assessed  Yes      Static Standing Balance   Static Standing - Balance Support  No upper extremity supported    Static Standing Balance -  Activities   Single Leg Stance - Right Leg;Single Leg Stance - Left Leg    Static Standing - Comment/# of Minutes  R:15 sec L: 0 sec      Standardized Balance Assessment   Standardized Balance Assessment  Five Times Sit to Stand    Five times sit to stand comments   24.5 sec, chair, no UE, LLE extended           Objective measurements completed on examination: See above findings.        PT Education - 03/04/18 1344    Education Details  exam findings, POC, HEP    Person(s) Educated  Patient    Methods  Explanation;Demonstration;Handout    Comprehension  Verbalized understanding       PT Short Term Goals - 03/04/18 1629      PT SHORT TERM GOAL #1   Title  Pt will be independent with HEP and perform consistently in order to improve ROM and decrease pain.    Time  3    Period  Weeks    Status  New    Target Date  03/25/18      PT SHORT TERM GOAL #2   Title  Pt will have decreased joint line edema by 2cm or > in order to maximize ROM and decrease pain.    Time  3    Period  Weeks    Status  New      PT SHORT TERM GOAL #3   Title  Pt will have improved L knee AROM from 10-95 deg in order to maximize gait and decrease pain.    Time  3    Period  Weeks    Status  New      PT SHORT TERM GOAL #4   Title  Pt will have 1/2 grade throughout BLE MMT in order to maximize functional mobility and decrease pain.     Time  3    Period  Weeks    Status  New        PT Long Term Goals - 03/04/18 1630      PT LONG TERM GOAL #1   Title  Pt will have improved L knee AROM from 5-115deg in order to further maximize gait, stair ambulation, and allow him to return to gardening with greater  ease.    Time  6    Period  Weeks    Status  New    Target Date  04/15/18      PT LONG TERM GOAL #2   Title  Pt will have 1 grade improvement throughout BLE MMT in order to maximize functional mobility, gait on uneven ground, and allow him to perform household chores with greater ease.    Time  6    Period  Weeks    Status  New      PT LONG TERM GOAL #3   Title  Pt will be able to perform bil SLS for 20 sec or > in order to maximize stair ambulation and overall gait mechanics.    Time  6    Period  Weeks    Status  New      PT LONG TERM GOAL #4   Title  Pt will have 157ft improvement or > during 3MWT wtih LRAD in order to demo improved functional strength and maximize his ability to return to yardwork.    Time  6    Period  Weeks    Status  New      PT LONG TERM GOAL #5   Title  Pt will be able to perform 5xSTS  in 12 sec or < without UE and proper mechanics in order to demo improved functional strength and maximize pt's transfers.     Time  6    Period  Weeks    Status  New             Plan - 03/04/18 1627    Clinical Impression Statement  Pt is pleasant 72YO M who presents to OPPT s/p L TKA on 02/25/18 by Dr. Paralee Cancel. Pt presents with post-op deficits in edema, ROM, strength, balance, gait, functional strength and functional mobility. Pt's AROM 20-78 deg this date. Pt needs skilled PT intervention to address impairments in order to decrease pain, swelling, and maximize ROM and promote return to PLOF.     Clinical Presentation  Stable    Clinical Presentation due to:  edema, ROM, MMT, functional strength, functional mobility, SLS, 3MWT, 5xSTS    Clinical Decision Making  Low    Rehab Potential  Good    PT Frequency  3x / week    PT Duration  6 weeks    PT Treatment/Interventions  ADLs/Self Care Home Management;Biofeedback;Cryotherapy;Electrical Stimulation;Ultrasound;DME Instruction;Gait training;Stair training;Functional mobility training;Therapeutic  activities;Therapeutic exercise;Balance training;Neuromuscular re-education;Patient/family education;Orthotic Fit/Training;Manual techniques;Manual lymph drainage;Passive range of motion;Dry needling;Energy conservation;Taping    PT Next Visit Plan  review eval and goals; focus on ROM and edema management    PT Home Exercise Plan  eval: quad sets, heel slides    Consulted and Agree with Plan of Care  Patient       Patient will benefit from skilled therapeutic intervention in order to improve the following deficits and impairments:  Abnormal gait, Decreased activity tolerance, Decreased balance, Decreased endurance, Decreased mobility, Decreased range of motion, Decreased scar mobility, Decreased strength, Difficulty walking, Hypomobility, Increased edema, Increased fascial restricitons, Increased muscle spasms, Impaired flexibility, Pain  Visit Diagnosis: Stiffness of left knee, not elsewhere classified - Plan: PT plan of care cert/re-cert  Localized edema - Plan: PT plan of care cert/re-cert  Muscle weakness (generalized) - Plan: PT plan of care cert/re-cert  Difficulty in walking, not elsewhere classified - Plan: PT plan of care cert/re-cert     Problem List Patient Active Problem List   Diagnosis Date Noted  . Obese 02/28/2018  . S/P left TKA 02/25/2018  . S/P total knee replacement 02/25/2018  . Fever 10/09/2016  . Community acquired pneumonia of left lower lobe of lung ([place]) 10/09/2016  . Gram negative sepsis ([place]) 02/05/2014  . UTI (urinary tract infection) 02/04/2014  . Sepsis ([place]) 02/04/2014  . DM type 2 (diabetes mellitus, type 2) ([place]) 02/04/2014  . Tobacco abuse 02/04/2014  . Preop cardiovascular exam 08/05/2013  . CAD (coronary artery disease) 08/05/2012  . Degenerative lumbar spinal stenosis 04/03/2012  . Chest pain 04/03/2012  . Anemia 04/03/2012  . Smoking 06/13/2011  . JOINT EFFUSION, KNEE 01/11/2010  . ARTHRITIS, RIGHT KNEE 12/12/2009  . PAIN IN JOINT,  LOWER LEG 12/12/2009  . PROSTATE CANCER 11/08/2009  . GOUTY ARTHRITIS, CHRONIC 11/08/2009  . Overweight 11/08/2009  . HYPERTENSION, BENIGN 11/08/2009  . Carotid artery disease without cerebral infarction ([place]) 11/08/2009  . Malignant neoplasm of bladder ([place]) 03/22/2008  . Hyperlipidemia 03/22/2008  . PAD (peripheral artery disease) ([place]) 03/22/2008  . CONSTIPATION, CHRONIC 03/22/2008  . DEGENERATIVE JOINT DISEASE 03/22/2008  . OSA (obstructive sleep apnea) 03/22/2008  . PROSTATE CANCER, HX OF 03/22/2008  . DIVERTICULITIS, HX OF 03/22/2008        Geraldine Solar PT, DPT  [facility] [address] [place], [place], [address] Phone: [phone]   Fax:  [phone]  Name: [name] MRN: [id number] Date of Birth: Jan 17, 1946

## 2018-03-04 NOTE — Patient Instructions (Signed)
Access Code: [email or web address]  URL: [email or web address]  Date: 03/04/2018  Prepared by: Geraldine Solar   Exercises Long Sitting Quad Set - 10 reps - 3 sets - 5-10 hold - 1x daily - 7x weekly Supine Heel Slide with Strap - 10 reps - 3 sets - 5-10 hold - 1x daily - 7x weekly

## 2018-03-05 ENCOUNTER — Ambulatory Visit (HOSPITAL_COMMUNITY): Payer: Medicare HMO

## 2018-03-05 DIAGNOSIS — R262 Difficulty in walking, not elsewhere classified: Secondary | ICD-10-CM | POA: Diagnosis not present

## 2018-03-05 DIAGNOSIS — M25662 Stiffness of left knee, not elsewhere classified: Secondary | ICD-10-CM | POA: Diagnosis not present

## 2018-03-05 DIAGNOSIS — M6281 Muscle weakness (generalized): Secondary | ICD-10-CM

## 2018-03-05 DIAGNOSIS — R600 Localized edema: Secondary | ICD-10-CM

## 2018-03-05 NOTE — Therapy (Signed)
[place], [place], [address] Phone: [phone]   Fax:  [phone]  Physical Therapy Treatment  Patient Details  Name: [name] MRN: [id number] Date of Birth: 08-Aug-1946 Referring Provider: Paralee Cancel, MD   Encounter Date: 03/05/2018  PT End of Session - 03/05/18 0900    Visit Number  2    Number of Visits  19    Date for PT Re-Evaluation  04/15/18 mini reassess on 03/25/18    Authorization Type  [employer]    Authorization Time Period  03/04/18 to 04/15/18    Authorization - Visit Number  2    Authorization - Number of Visits  10    PT Start Time  0900    PT Stop Time  0944    PT Time Calculation (min)  44 min    Activity Tolerance  Patient tolerated treatment well    Behavior During Therapy  [facility] for tasks assessed/performed       Past Medical History:  Diagnosis Date  . Bilateral renal cysts   . Bladder cancer ([place]) UROLOGIST-  DR [facility]   RECURRENT 11/ 2017 --  hx turbt in 2004 and 08-19-2013  . BPH without obstruction/lower urinary tract symptoms   . Cervical fusion syndrome    LEFT ARM NUMBNESS--  CONTROLLED W/ GABAPENTIN  . Coronary artery disease    CARDIOLOGIST-- DR BRANCH [name])  . DDD (degenerative disc disease), cervical   . DDD (degenerative disc disease), lumbar   . Diverticulosis of sigmoid colon   . History of acute gouty arthritis   . History of adenomatous polyp of colon    2001;  2012- non-malignant leiomyoma;  04-07-2014  tubualr adenoma and hyperplastic polyp's  . History of prostate cancer UROLOGIST-  DR [facility]   S/P  RADIOACTIVE SEED IMPLANTS 2004  . HOH (hard of hearing)    no hearing aids  . Hyperlipidemia   . Hypertension   . OA (osteoarthritis)   . Occlusion and stenosis of carotid artery without mention of cerebral infarction CARDIOLOGIST  -- DR  [name]   LAST DUPLEX  11-23-2015  RICA  (DUPLEX DOPPLER 01-20-2016 RICA 07-62% PROXIMAL) /   LICA  <26% (HAD CONSULT W/ [name] , VASCULAR SURGEON 01-20-2016)  . OSA on CPAP    BiPAP  . Spondylosis, cervical   . Type 2 diabetes mellitus ([place])   . Wears glasses     Past Surgical History:  Procedure Laterality Date  . ANTERIOR REMOVAL CAGE AND PLATE C3-C7/ CORPECTOMY C7 (FX)/  ALLOGRAFT AND FUSION C3 -- T1/  POSTERIOR DECOMPRESSION BILATERAL LAMINECTOMY C4 -- 6 & PARTIAL C3/  POSTEROLATERAL ARTHRODESIS C3 - T1  06/05/2005  . APPENDECTOMY  1990'S  . CATARACT EXTRACTION W/ INTRAOCULAR LENS  IMPLANT, BILATERAL  2013  . CERVICAL FUSION  05/11/2005   C3  --  C7/  due to post op quadriparesis same day s/p  anterior C 4,5,6, colpectomy, decompression, removal epidural hematoma, foraminnotomy, cage and plate  . COLONOSCOPY  last one 04-07-2014  . CYSTOSCOPY W/ RETROGRADES Bilateral 08/19/2013   Procedure: CYSTOSCOPY WITH RETROGRADE PYELOGRAM;  Surgeon: Molli Hazard, MD;  Location[facility];  Service: Urology;  Laterality: Bilateral;  . CYSTOSCOPY W/ RETROGRADES Bilateral 07/18/2016   Procedure: CYSTOSCOPY WITH RETROGRADE PYELOGRAM;  Surgeon: Alexis Frock, MD;  Location: [facility];  Service: Urology;  Laterality: Bilateral;  . ENDOSCOPIC REPAIR CSF LEAK VIA NASAL PASSAGE  2011  . INTRAOPERATIVE ARTERIOGRAM  CATH LAB  01-05-2009  DR Einar Gip   RICA   ACUTE ANGLE 80-85% STENOSIS  . KNEE ARTHROSCOPY Left 07/15/2017   Procedure: ARTHROSCOPY LEFT  KNEE AND DEBRIDEMENT, medial meniscal tear and chondromalaita;  Surgeon: Paralee Cancel, MD;  Location: [facility];  Service: Orthopedics;  Laterality: Left;  60 MINS  . LUMBAR FUSION  03-26-2012   L3 --  L5  . RADIOACTIVE PROSTATE SEED IMPLANTS  08-19-2003  . SEPTOPLASTY  1998  . TOTAL KNEE ARTHROPLASTY Left 02/25/2018   Procedure: LEFT TOTAL KNEE ARTHROPLASTY;  Surgeon: Paralee Cancel, MD;  Location: [facility];  Service: Orthopedics;  Laterality: Left;  70 mins  . TRANSURETHRAL RESECTION OF BLADDER TUMOR N/A 08/19/2013    Procedure: TRANSURETHRAL RESECTION OF BLADDER TUMOR (TURBT);  Surgeon: Molli Hazard, MD;  Location: [facility];  Service: Urology;  Laterality: N/A;  . TRANSURETHRAL RESECTION OF BLADDER TUMOR N/A 07/18/2016   Procedure: TRANSURETHRAL RESECTION OF BLADDER TUMOR (TURBT);  Surgeon: Alexis Frock, MD;  Location: [facility];  Service: Urology;  Laterality: N/A;  . YAG LASER APPLICATION  29/79/8921   Procedure: YAG LASER APPLICATION;  Surgeon: Elta Guadeloupe T. Gershon Crane, MD;  Location: [facility];  Service: Ophthalmology;  Laterality: Right;    There were no vitals filed for this visit.  Subjective Assessment - 03/05/18 0901    Subjective  Pt reports that he was very sore following his evaluation yesterday. He took his pain pills this morning to try and decrease his pain following today's session.    Limitations  Standing;Walking;House hold activities    How long can you sit comfortably?  59min in recliner; 15 mins with legs in dependent position    How long can you stand comfortably?  hasn't tested it, probably 15 mins    How long can you walk comfortably?  has only been walking in the house    Patient Stated Goals  get better in a hurry    Currently in Pain?  Yes    Pain Score  6     Pain Location  Knee    Pain Orientation  Left    Pain Descriptors / Indicators  Sharp    Pain Type  Surgical pain    Pain Onset  1 to 4 weeks ago    Pain Frequency  Constant    Aggravating Factors   bending it, being on it too long    Pain Relieving Factors  rest, pain meds    Effect of Pain on Daily Activities  increases             [facility] Adult PT Treatment/Exercise - 03/05/18 0001      Exercises   Exercises  Knee/Hip      Knee/Hip Exercises: Stretches   Passive Hamstring Stretch  Left;3 reps;30 seconds    Passive Hamstring Stretch Limitations  supine with rope      Knee/Hip Exercises: Standing   Gait Training  x1 lap around gym with RW focusing on heel to toe gait       Knee/Hip Exercises: Supine   Quad Sets  Left;2 sets;10 reps    Quad Sets Limitations  5" holds    Short Arc [employer]  Left;2 sets;10 reps    Short Arc Quad Sets Limitations  bolster, 3-5" holds    Heel Slides  Left;2 sets;10 reps    Heel Slides Limitations  2-3" holds    Knee Extension Limitations  18  Knee Flexion Limitations  85      Manual Therapy   Manual Therapy  Edema management    Manual therapy comments  completed separate rest of treatment    Edema Management  retro massage with BLE elevated +ankle pumps to decrease swelling           PT Education - 03/05/18 0900    Education Details  reviewed goals and issued copy of eval, exercise technique, continue HEP    Person(s) Educated  Patient    Methods  Explanation;Demonstration;Handout    Comprehension  Verbalized understanding;Returned demonstration       PT Short Term Goals - 03/04/18 1629      PT SHORT TERM GOAL #1   Title  Pt will be independent with HEP and perform consistently in order to improve ROM and decrease pain.    Time  3    Period  Weeks    Status  New    Target Date  03/25/18      PT SHORT TERM GOAL #2   Title  Pt will have decreased joint line edema by 2cm or > in order to maximize ROM and decrease pain.    Time  3    Period  Weeks    Status  New      PT SHORT TERM GOAL #3   Title  Pt will have improved L knee AROM from 10-95 deg in order to maximize gait and decrease pain.    Time  3    Period  Weeks    Status  New      PT SHORT TERM GOAL #4   Title  Pt will have 1/2 grade throughout BLE MMT in order to maximize functional mobility and decrease pain.     Time  3    Period  Weeks    Status  New        PT Long Term Goals - 03/04/18 1630      PT LONG TERM GOAL #1   Title  Pt will have improved L knee AROM from 5-115deg in order to further maximize gait, stair ambulation, and allow him to return to gardening with greater ease.    Time  6    Period  Weeks    Status  New     Target Date  04/15/18      PT LONG TERM GOAL #2   Title  Pt will have 1 grade improvement throughout BLE MMT in order to maximize functional mobility, gait on uneven ground, and allow him to perform household chores with greater ease.    Time  6    Period  Weeks    Status  New      PT LONG TERM GOAL #3   Title  Pt will be able to perform bil SLS for 20 sec or > in order to maximize stair ambulation and overall gait mechanics.    Time  6    Period  Weeks    Status  New      PT LONG TERM GOAL #4   Title  Pt will have 117ft improvement or > during 3MWT wtih LRAD in order to demo improved functional strength and maximize his ability to return to yardwork.    Time  6    Period  Weeks    Status  New      PT LONG TERM GOAL #5   Title  Pt will be able to perform 5xSTS  in 12 sec  or < without UE and proper mechanics in order to demo improved functional strength and maximize pt's transfers.     Time  6    Period  Weeks    Status  New            Plan - 03/05/18 0945    Clinical Impression Statement  Began session by reviewing initial goals and issuing copy of eval; no f/u questions afterwards. Today's session focused on L knee mobility and addressing edema. Pt tolerated well but was slightly limited due to pain. AROM 18 to 85deg this date. Continue to focus on ROM and edema, progressing as tolerated.     Rehab Potential  Good    PT Frequency  3x / week    PT Duration  6 weeks    PT Treatment/Interventions  ADLs/Self Care Home Management;Biofeedback;Cryotherapy;Electrical Stimulation;Ultrasound;DME Instruction;Gait training;Stair training;Functional mobility training;Therapeutic activities;Therapeutic exercise;Balance training;Neuromuscular re-education;Patient/family education;Orthotic Fit/Training;Manual techniques;Manual lymph drainage;Passive range of motion;Dry needling;Energy conservation;Taping    PT Next Visit Plan  continue focus on ROM and edema management    PT Home Exercise  Plan  eval: quad sets, heel slides    Consulted and Agree with Plan of Care  Patient       Patient will benefit from skilled therapeutic intervention in order to improve the following deficits and impairments:  Abnormal gait, Decreased activity tolerance, Decreased balance, Decreased endurance, Decreased mobility, Decreased range of motion, Decreased scar mobility, Decreased strength, Difficulty walking, Hypomobility, Increased edema, Increased fascial restricitons, Increased muscle spasms, Impaired flexibility, Pain  Visit Diagnosis: Stiffness of left knee, not elsewhere classified  Localized edema  Muscle weakness (generalized)  Difficulty in walking, not elsewhere classified     Problem List Patient Active Problem List   Diagnosis Date Noted  . Obese 02/28/2018  . S/P left TKA 02/25/2018  . S/P total knee replacement 02/25/2018  . Fever 10/09/2016  . Community acquired pneumonia of left lower lobe of lung ([place]) 10/09/2016  . Gram negative sepsis ([place]) 02/05/2014  . UTI (urinary tract infection) 02/04/2014  . Sepsis ([place]) 02/04/2014  . DM type 2 (diabetes mellitus, type 2) ([place]) 02/04/2014  . Tobacco abuse 02/04/2014  . Preop cardiovascular exam 08/05/2013  . CAD (coronary artery disease) 08/05/2012  . Degenerative lumbar spinal stenosis 04/03/2012  . Chest pain 04/03/2012  . Anemia 04/03/2012  . Smoking 06/13/2011  . JOINT EFFUSION, KNEE 01/11/2010  . ARTHRITIS, RIGHT KNEE 12/12/2009  . PAIN IN JOINT, LOWER LEG 12/12/2009  . PROSTATE CANCER 11/08/2009  . GOUTY ARTHRITIS, CHRONIC 11/08/2009  . Overweight 11/08/2009  . HYPERTENSION, BENIGN 11/08/2009  . Carotid artery disease without cerebral infarction ([place]) 11/08/2009  . Malignant neoplasm of bladder ([place]) 03/22/2008  . Hyperlipidemia 03/22/2008  . PAD (peripheral artery disease) ([place]) 03/22/2008  . CONSTIPATION, CHRONIC 03/22/2008  . DEGENERATIVE JOINT DISEASE 03/22/2008  . OSA (obstructive sleep apnea)  03/22/2008  . PROSTATE CANCER, HX OF 03/22/2008  . DIVERTICULITIS, HX OF 03/22/2008      Geraldine Solar PT, DPT  [facility] [address] [place], [place], [address] Phone: [phone]   Fax:  [phone]  Name: [name] MRN: [id number] Date of Birth: [birth year]

## 2018-03-07 ENCOUNTER — Ambulatory Visit (HOSPITAL_COMMUNITY): Payer: Medicare HMO | Admitting: Physical Therapy

## 2018-03-07 ENCOUNTER — Encounter (HOSPITAL_COMMUNITY): Payer: Self-pay | Admitting: Physical Therapy

## 2018-03-07 DIAGNOSIS — R262 Difficulty in walking, not elsewhere classified: Secondary | ICD-10-CM | POA: Diagnosis not present

## 2018-03-07 DIAGNOSIS — M25662 Stiffness of left knee, not elsewhere classified: Secondary | ICD-10-CM | POA: Diagnosis not present

## 2018-03-07 DIAGNOSIS — M6281 Muscle weakness (generalized): Secondary | ICD-10-CM

## 2018-03-07 DIAGNOSIS — R600 Localized edema: Secondary | ICD-10-CM

## 2018-03-07 NOTE — Therapy (Signed)
[facility] [place], [place], [address] Phone: [phone]   Fax:  [phone]  Physical Therapy Treatment  Patient Details  Name: [name] MRN: [id number] Date of Birth: [birth year] Referring Provider: Paralee Cancel, MD   Encounter Date: 03/07/2018  PT End of Session - 03/07/18 0907    Visit Number  3    Number of Visits  19    Date for PT Re-Evaluation  04/15/18 mini reassess on 03/25/18    Authorization Type  [employer] Medicare    Authorization Time Period  03/04/18 to 04/15/18    Authorization - Visit Number  3    Authorization - Number of Visits  10    PT Start Time  0817    PT Stop Time  0900    PT Time Calculation (min)  43 min    Equipment Utilized During Treatment  Gait belt    Activity Tolerance  Patient tolerated treatment well    Behavior During Therapy  [facility] for tasks assessed/performed       Past Medical History:  Diagnosis Date  . Bilateral renal cysts   . Bladder cancer ([place]) UROLOGIST-  DR [facility]   RECURRENT 11/ 2017 --  hx turbt in 2004 and 08-19-2013  . BPH without obstruction/lower urinary tract symptoms   . Cervical fusion syndrome    LEFT ARM NUMBNESS--  CONTROLLED W/ GABAPENTIN  . Coronary artery disease    CARDIOLOGIST-- DR BRANCH [name])  . DDD (degenerative disc disease), cervical   . DDD (degenerative disc disease), lumbar   . Diverticulosis of sigmoid colon   . History of acute gouty arthritis   . History of adenomatous polyp of colon    2001;  2012- non-malignant leiomyoma;  04-07-2014  tubualr adenoma and hyperplastic polyp's  . History of prostate cancer UROLOGIST-  DR [facility]   S/P  RADIOACTIVE SEED IMPLANTS 2004  . HOH (hard of hearing)    no hearing aids  . Hyperlipidemia   . Hypertension   . OA (osteoarthritis)   . Occlusion and stenosis of carotid artery without mention of cerebral infarction CARDIOLOGIST  -- DR  [name]   LAST DUPLEX  11-23-2015  RICA   (DUPLEX DOPPLER 01-20-2016 RICA 93-81% PROXIMAL) /   LICA <82% (HAD CONSULT W/ [name] , VASCULAR SURGEON 01-20-2016)  . OSA on CPAP    BiPAP  . Spondylosis, cervical   . Type 2 diabetes mellitus ([place])   . Wears glasses     Past Surgical History:  Procedure Laterality Date  . ANTERIOR REMOVAL CAGE AND PLATE C3-C7/ CORPECTOMY C7 (FX)/  ALLOGRAFT AND FUSION C3 -- T1/  POSTERIOR DECOMPRESSION BILATERAL LAMINECTOMY C4 -- 6 & PARTIAL C3/  POSTEROLATERAL ARTHRODESIS C3 - T1  06/05/2005  . APPENDECTOMY  1990'S  . CATARACT EXTRACTION W/ INTRAOCULAR LENS  IMPLANT, BILATERAL  2013  . CERVICAL FUSION  05/11/2005   C3  --  C7/  due to post op quadriparesis same day s/p  anterior C 4,5,6, colpectomy, decompression, removal epidural hematoma, foraminnotomy, cage and plate  . COLONOSCOPY  last one 04-07-2014  . CYSTOSCOPY W/ RETROGRADES Bilateral 08/19/2013   Procedure: CYSTOSCOPY WITH RETROGRADE PYELOGRAM;  Surgeon: Molli Hazard, MD;  Location: [facility];  Service: Urology;  Laterality: Bilateral;  . CYSTOSCOPY W/ RETROGRADES Bilateral 07/18/2016   Procedure: CYSTOSCOPY WITH RETROGRADE PYELOGRAM;  Surgeon: Alexis Frock, MD;  Location: [facility];  Service: Urology;  Laterality: Bilateral;  .  ENDOSCOPIC REPAIR CSF LEAK VIA NASAL PASSAGE  2011  . INTRAOPERATIVE ARTERIOGRAM  CATH LAB  01-05-2009  DR Einar Gip   RICA   ACUTE ANGLE 80-85% STENOSIS  . KNEE ARTHROSCOPY Left 07/15/2017   Procedure: ARTHROSCOPY LEFT  KNEE AND DEBRIDEMENT, medial meniscal tear and chondromalaita;  Surgeon: Paralee Cancel, MD;  Location: [facility];  Service: Orthopedics;  Laterality: Left;  60 MINS  . LUMBAR FUSION  03-26-2012   L3 --  L5  . RADIOACTIVE PROSTATE SEED IMPLANTS  08-19-2003  . SEPTOPLASTY  1998  . TOTAL KNEE ARTHROPLASTY Left 02/25/2018   Procedure: LEFT TOTAL KNEE ARTHROPLASTY;  Surgeon: Paralee Cancel, MD;  Location: [facility];  Service: Orthopedics;  Laterality: Left;  70 mins  .  TRANSURETHRAL RESECTION OF BLADDER TUMOR N/A 08/19/2013   Procedure: TRANSURETHRAL RESECTION OF BLADDER TUMOR (TURBT);  Surgeon: Molli Hazard, MD;  Location[facility];  Service: Urology;  Laterality: N/A;  . TRANSURETHRAL RESECTION OF BLADDER TUMOR N/A 07/18/2016   Procedure: TRANSURETHRAL RESECTION OF BLADDER TUMOR (TURBT);  Surgeon: Alexis Frock, MD;  Location: [facility];  Service: Urology;  Laterality: N/A;  . YAG LASER APPLICATION  55/73/2202   Procedure: YAG LASER APPLICATION;  Surgeon: Elta Guadeloupe T. Gershon Crane, MD;  Location: [facility];  Service: Ophthalmology;  Laterality: Right;    There were no vitals filed for this visit.  Subjective Assessment - 03/07/18 5427    Subjective  Patient reported that he feels like he aggravated his knee yesterday by doing more than he should have. Patient stated that he is trying to do his exercises at home.     Limitations  Standing;Walking;House hold activities    How long can you sit comfortably?  65min in recliner; 15 mins with legs in dependent position    How long can you stand comfortably?  hasn't tested it, probably 15 mins    How long can you walk comfortably?  has only been walking in the house    Patient Stated Goals  get better in a hurry    Currently in Pain?  Yes    Pain Score  6     Pain Location  Knee    Pain Orientation  Left    Pain Descriptors / Indicators  Sharp    Pain Type  Surgical pain    Pain Onset  1 to 4 weeks ago                       [facility] Adult PT Treatment/Exercise - 03/07/18 0001      Knee/Hip Exercises: Stretches   Passive Hamstring Stretch  Left;3 reps;30 seconds    Passive Hamstring Stretch Limitations  Seated with foot on 6'' step      Knee/Hip Exercises: Standing   Gait Training  x1 lap (226 feet) around gym with RW focusing on heel to toe gait      Knee/Hip Exercises: Supine   Quad Sets  Left;2 sets;10 reps    Quad Sets Limitations  5'' holds    Short Arc  [employer]  Left;2 sets;10 reps    Short Arc Quad Sets Limitations  left knee over bolster with 5'' holds    Heel Slides  Left;2 sets;10 reps    Heel Slides Limitations  2-3" holds    Knee Extension Limitations  17    Knee Flexion Limitations  85    Other Supine Knee/Hip Exercises  Supine ankle pumps with bilateral lower extremities elevated  3 x 10 repetitions      Manual Therapy   Manual Therapy  Edema management    Manual therapy comments  completed separate rest of treatment    Edema Management  Retrograde massage to patient's left lower extremity with bilateral lower extremities elevated. Patient supine, due to noted edema in left lower extremity.              PT Education - 03/07/18 0906    Education Details  Discussed with patient that if he is still not cleared to drive per his physician he should not ride his lawnmower.     Person(s) Educated  Patient    Methods  Explanation    Comprehension  Verbalized understanding       PT Short Term Goals - 03/07/18 0908      PT SHORT TERM GOAL #1   Title  Pt will be independent with HEP and perform consistently in order to improve ROM and decrease pain.    Time  3    Period  Weeks    Status  On-going      PT SHORT TERM GOAL #2   Title  Pt will have decreased joint line edema by 2cm or > in order to maximize ROM and decrease pain.    Time  3    Period  Weeks    Status  On-going      PT SHORT TERM GOAL #3   Title  Pt will have improved L knee AROM from 10-95 deg in order to maximize gait and decrease pain.    Time  3    Period  Weeks    Status  On-going      PT SHORT TERM GOAL #4   Title  Pt will have 1/2 grade throughout BLE MMT in order to maximize functional mobility and decrease pain.     Time  3    Period  Weeks    Status  On-going        PT Long Term Goals - 03/07/18 0908      PT LONG TERM GOAL #1   Title  Pt will have improved L knee AROM from 5-115deg in order to further maximize gait, stair ambulation,  and allow him to return to gardening with greater ease.    Time  6    Period  Weeks    Status  On-going      PT LONG TERM GOAL #2   Title  Pt will have 1 grade improvement throughout BLE MMT in order to maximize functional mobility, gait on uneven ground, and allow him to perform household chores with greater ease.    Time  6    Period  Weeks    Status  On-going      PT LONG TERM GOAL #3   Title  Pt will be able to perform bil SLS for 20 sec or > in order to maximize stair ambulation and overall gait mechanics.    Time  6    Period  Weeks    Status  On-going      PT LONG TERM GOAL #4   Title  Pt will have 121ft improvement or > during 3MWT wtih LRAD in order to demo improved functional strength and maximize his ability to return to yardwork.    Time  6    Period  Weeks    Status  On-going      PT LONG TERM GOAL #5   Title  Pt  will be able to perform 5xSTS  in 12 sec or < without UE and proper mechanics in order to demo improved functional strength and maximize pt's transfers.     Time  6    Period  Weeks    Status  On-going            Plan - 03/07/18 0907    Clinical Impression Statement  This session continued to focus on improving patient's left knee ROM. This session changed hamstring stretch to a seated passive hamstring stretch. Patient required frequent verbal and tactile cueing to properly perform exercises particularly with quad sets and short arc quads to activate the left quadriceps. This session patient's left knee extension AROM was 17 degrees and flexion AROM was 85 degrees at end of session. Patient would benefit from continued skilled physical therapy in order to continue progress toward functional goals.     Rehab Potential  Good    PT Frequency  3x / week    PT Duration  6 weeks    PT Treatment/Interventions  ADLs/Self Care Home Management;Biofeedback;Cryotherapy;Electrical Stimulation;Ultrasound;DME Instruction;Gait training;Stair training;Functional  mobility training;Therapeutic activities;Therapeutic exercise;Balance training;Neuromuscular re-education;Patient/family education;Orthotic Fit/Training;Manual techniques;Manual lymph drainage;Passive range of motion;Dry needling;Energy conservation;Taping    PT Next Visit Plan  continue focus on ROM and edema management    PT Home Exercise Plan  eval: quad sets, heel slides    Consulted and Agree with Plan of Care  Patient       Patient will benefit from skilled therapeutic intervention in order to improve the following deficits and impairments:  Abnormal gait, Decreased activity tolerance, Decreased balance, Decreased endurance, Decreased mobility, Decreased range of motion, Decreased scar mobility, Decreased strength, Difficulty walking, Hypomobility, Increased edema, Increased fascial restricitons, Increased muscle spasms, Impaired flexibility, Pain  Visit Diagnosis: Stiffness of left knee, not elsewhere classified  Localized edema  Muscle weakness (generalized)  Difficulty in walking, not elsewhere classified     Problem List Patient Active Problem List   Diagnosis Date Noted  . Obese 02/28/2018  . S/P left TKA 02/25/2018  . S/P total knee replacement 02/25/2018  . Fever 10/09/2016  . Community acquired pneumonia of left lower lobe of lung ([place]) 10/09/2016  . Gram negative sepsis ([place]) 02/05/2014  . UTI (urinary tract infection) 02/04/2014  . Sepsis ([place]) 02/04/2014  . DM type 2 (diabetes mellitus, type 2) ([place]) 02/04/2014  . Tobacco abuse 02/04/2014  . Preop cardiovascular exam 08/05/2013  . CAD (coronary artery disease) 08/05/2012  . Degenerative lumbar spinal stenosis 04/03/2012  . Chest pain 04/03/2012  . Anemia 04/03/2012  . Smoking 06/13/2011  . JOINT EFFUSION, KNEE 01/11/2010  . ARTHRITIS, RIGHT KNEE 12/12/2009  . PAIN IN JOINT, LOWER LEG 12/12/2009  . PROSTATE CANCER 11/08/2009  . GOUTY ARTHRITIS, CHRONIC 11/08/2009  . Overweight 11/08/2009  . HYPERTENSION,  BENIGN 11/08/2009  . Carotid artery disease without cerebral infarction ([place]) 11/08/2009  . Malignant neoplasm of bladder ([place]) 03/22/2008  . Hyperlipidemia 03/22/2008  . PAD (peripheral artery disease) ([place]) 03/22/2008  . CONSTIPATION, CHRONIC 03/22/2008  . DEGENERATIVE JOINT DISEASE 03/22/2008  . OSA (obstructive sleep apnea) 03/22/2008  . PROSTATE CANCER, HX OF 03/22/2008  . DIVERTICULITIS, HX OF 03/22/2008   Clarene Critchley PT, DPT 9:11 AM, 03/07/18 [place], [place], [address] Phone: [phone]   Fax:  [phone]  Name: MANTAJ CHAMBERLIN MRN: [id number] Date of Birth: December 26, 1945

## 2018-03-10 ENCOUNTER — Ambulatory Visit (HOSPITAL_COMMUNITY): Payer: Medicare HMO | Admitting: Physical Therapy

## 2018-03-10 ENCOUNTER — Other Ambulatory Visit: Payer: Self-pay

## 2018-03-10 DIAGNOSIS — M6281 Muscle weakness (generalized): Secondary | ICD-10-CM

## 2018-03-10 DIAGNOSIS — R600 Localized edema: Secondary | ICD-10-CM | POA: Diagnosis not present

## 2018-03-10 DIAGNOSIS — R262 Difficulty in walking, not elsewhere classified: Secondary | ICD-10-CM

## 2018-03-10 DIAGNOSIS — M25662 Stiffness of left knee, not elsewhere classified: Secondary | ICD-10-CM

## 2018-03-10 NOTE — Therapy (Addendum)
[place], [place], [address] Phone: [phone]   Fax:  [phone]  Physical Therapy Treatment  Patient Details  Name: [name] MRN: [id number] Date of Birth: 1946-03-05 Referring Provider: Paralee Cancel, MD   Encounter Date: 03/10/2018  PT End of Session - 03/10/18 1341    Visit Number  4    Number of Visits  19    Date for PT Re-Evaluation  04/15/18 mini reassess on 03/25/18    Authorization Type  A[employer]na Medicare    Authorization Time Period  03/04/18 to 04/15/18    Authorization - Visit Number  4    Authorization - Number of Visits  10    PT Start Time  1300    PT Stop Time  1345    PT Time Calculation (min)  45 min    Equipment Utilized During Treatment  Gait belt    Activity Tolerance  Patient tolerated treatment well    Behavior During Therapy  [facility] for tasks assessed/performed       Past Medical History:  Diagnosis Date  . Bilateral renal cysts   . Bladder cancer ([place]) UROLOGIST-  DR [facility]   RECURRENT 11/ 2017 --  hx turbt in 2004 and 08-19-2013  . BPH without obstruction/lower urinary tract symptoms   . Cervical fusion syndrome    LEFT ARM NUMBNESS--  CONTROLLED W/ GABAPENTIN  . Coronary artery disease    CARDIOLOGIST-- DR BRANCH [name])  . DDD (degenerative disc disease), cervical   . DDD (degenerative disc disease), lumbar   . Diverticulosis of sigmoid colon   . History of acute gouty arthritis   . History of adenomatous polyp of colon    2001;  2012- non-malignant leiomyoma;  04-07-2014  tubualr adenoma and hyperplastic polyp's  . History of prostate cancer UROLOGIST-  DR [facility]   S/P  RADIOACTIVE SEED IMPLANTS 2004  . HOH (hard of hearing)    no hearing aids  . Hyperlipidemia   . Hypertension   . OA (osteoarthritis)   . Occlusion and stenosis of carotid artery without mention of cerebral infarction CARDIOLOGIST  -- DR  [name]   LAST DUPLEX  11-23-2015  RICA   (DUPLEX DOPPLER 01-20-2016 RICA 13-24% PROXIMAL) /   LICA <40% (HAD CONSULT W/ [name] , VASCULAR SURGEON 01-20-2016)  . OSA on CPAP    BiPAP  . Spondylosis, cervical   . Type 2 diabetes mellitus ([place])   . Wears glasses     Past Surgical History:  Procedure Laterality Date  . ANTERIOR REMOVAL CAGE AND PLATE C3-C7/ CORPECTOMY C7 (FX)/  ALLOGRAFT AND FUSION C3 -- T1/  POSTERIOR DECOMPRESSION BILATERAL LAMINECTOMY C4 -- 6 & PARTIAL C3/  POSTEROLATERAL ARTHRODESIS C3 - T1  06/05/2005  . APPENDECTOMY  1990'S  . CATARACT EXTRACTION W/ INTRAOCULAR LENS  IMPLANT, BILATERAL  2013  . CERVICAL FUSION  05/11/2005   C3  --  C7/  due to post op quadriparesis same day s/p  anterior C 4,5,6, colpectomy, decompression, removal epidural hematoma, foraminnotomy, cage and plate  . COLONOSCOPY  last one 04-07-2014  . CYSTOSCOPY W/ RETROGRADES Bilateral 08/19/2013   Procedure: CYSTOSCOPY WITH RETROGRADE PYELOGRAM;  Surgeon: Molli Hazard, MD;  Location: [facility];  Service: Urology;  Laterality: Bilateral;  . CYSTOSCOPY W/ RETROGRADES Bilateral 07/18/2016   Procedure: CYSTOSCOPY WITH RETROGRADE PYELOGRAM;  Surgeon: Alexis Frock, MD;  Location: [facility];  Service: Urology;  Laterality: Bilateral;  .  ENDOSCOPIC REPAIR CSF LEAK VIA NASAL PASSAGE  2011  . INTRAOPERATIVE ARTERIOGRAM  CATH LAB  01-05-2009  DR Einar Gip   RICA   ACUTE ANGLE 80-85% STENOSIS  . KNEE ARTHROSCOPY Left 07/15/2017   Procedure: ARTHROSCOPY LEFT  KNEE AND DEBRIDEMENT, medial meniscal tear and chondromalaita;  Surgeon: Paralee Cancel, MD;  [facility];  Service: Orthopedics;  Laterality: Left;  60 MINS  . LUMBAR FUSION  03-26-2012   L3 --  L5  . RADIOACTIVE PROSTATE SEED IMPLANTS  08-19-2003  . SEPTOPLASTY  1998  . TOTAL KNEE ARTHROPLASTY Left 02/25/2018   Procedure: LEFT TOTAL KNEE ARTHROPLASTY;  Surgeon: Paralee Cancel, MD;  Location: [facility];  Service: Orthopedics;  Laterality: Left;  70 mins  .  TRANSURETHRAL RESECTION OF BLADDER TUMOR N/A 08/19/2013   Procedure: TRANSURETHRAL RESECTION OF BLADDER TUMOR (TURBT);  Surgeon: Molli Hazard, MD;  Location: [facility];  Service: Urology;  Laterality: N/A;  . TRANSURETHRAL RESECTION OF BLADDER TUMOR N/A 07/18/2016   Procedure: TRANSURETHRAL RESECTION OF BLADDER TUMOR (TURBT);  Surgeon: Alexis Frock, MD;  Location: [facility];  Service: Urology;  Laterality: N/A;  . YAG LASER APPLICATION  62/69/4854   Procedure: YAG LASER APPLICATION;  Surgeon: Elta Guadeloupe T. Gershon Crane, MD;  Location: [facility];  Service: Ophthalmology;  Laterality: Right;    There were no vitals filed for this visit.  Subjective Assessment - 03/10/18 1306    Subjective  Pt states that he is still sore especially when he does his exericses.     Limitations  Standing;Walking;House hold activities    How long can you sit comfortably?  56min in recliner; 15 mins with legs in dependent position    How long can you stand comfortably?  hasn't tested it, probably 15 mins    How long can you walk comfortably?  has only been walking in the house    Patient Stated Goals  get better in a hurry    Pain Score  4     Pain Location  Knee    Pain Orientation  Left;Lateral;Anterior    Pain Descriptors / Indicators  Aching    Pain Onset  1 to 4 weeks ago    Pain Frequency  Intermittent    Aggravating Factors   exercises    Pain Relieving Factors  ice    Effect of Pain on Daily Activities  limits                       [facility] Adult PT Treatment/Exercise - 03/10/18 0001      Exercises   Exercises  Knee/Hip      Knee/Hip Exercises: Stretches   Active Hamstring Stretch  Left;3 reps;30 seconds    Gastroc Stretch Limitations  slant board 20" x 3       Knee/Hip Exercises: Standing   Heel Raises  10 reps    Knee Flexion  Left;10 reps    Terminal Knee Extension  Left;10 reps      Knee/Hip Exercises: Seated   Long Arc Quad  10 reps       Knee/Hip Exercises: Supine   Knee Extension  PROM    Knee Extension Limitations  15    Knee Flexion  PROM    Knee Flexion Limitations  104      Manual Therapy   Manual Therapy  Edema management    Manual therapy comments  completed separate rest of treatment    Edema Management  Retrograde  massage to patient's left lower extremity with bilateral lower extremities elevated. Patient supine, due to noted edema in left lower extremity.                PT Short Term Goals - 03/10/18 1344      PT SHORT TERM GOAL #1   Title  Pt will be independent with HEP and perform consistently in order to improve ROM and decrease pain.    Time  3    Period  Weeks    Status  On-going      PT SHORT TERM GOAL #2   Title  Pt will have decreased joint line edema by 2cm or > in order to maximize ROM and decrease pain.    Time  3    Period  Weeks    Status  On-going      PT SHORT TERM GOAL #3   Title  Pt will have improved L knee AROM from 10-95 deg in order to maximize gait and decrease pain.    Time  3    Period  Weeks    Status  On-going      PT SHORT TERM GOAL #4   Title  Pt will have 1/2 grade throughout BLE MMT in order to maximize functional mobility and decrease pain.     Time  3    Period  Weeks    Status  On-going        PT Long Term Goals - 03/10/18 1344      PT LONG TERM GOAL #1   Title  Pt will have improved L knee AROM from 5-115deg in order to further maximize gait, stair ambulation, and allow him to return to gardening with greater ease.    Time  6    Period  Weeks    Status  On-going      PT LONG TERM GOAL #2   Title  Pt will have 1 grade improvement throughout BLE MMT in order to maximize functional mobility, gait on uneven ground, and allow him to perform household chores with greater ease.    Time  6    Period  Weeks    Status  On-going      PT LONG TERM GOAL #3   Title  Pt will be able to perform bil SLS for 20 sec or > in order to maximize stair ambulation  and overall gait mechanics.    Time  6    Period  Weeks    Status  On-going      PT LONG TERM GOAL #4   Title  Pt will have 146ft improvement or > during 3MWT wtih LRAD in order to demo improved functional strength and maximize his ability to return to yardwork.    Time  6    Period  Weeks    Status  On-going      PT LONG TERM GOAL #5   Title  Pt will be able to perform 5xSTS  in 12 sec or < without UE and proper mechanics in order to demo improved functional strength and maximize pt's transfers.     Time  6    Period  Weeks    Status  On-going            Plan - 03/10/18 1342    Clinical Impression Statement  Continue to work on ROM.  Added closed chained exercises working of ROM.  Pt ROM increased to 15-107 today.  Rehab Potential  Good    PT Frequency  3x / week    PT Duration  6 weeks    PT Treatment/Interventions  ADLs/Self Care Home Management;Biofeedback;Cryotherapy;Electrical Stimulation;Ultrasound;DME Instruction;Gait training;Stair training;Functional mobility training;Therapeutic activities;Therapeutic exercise;Balance training;Neuromuscular re-education;Patient/family education;Orthotic Fit/Training;Manual techniques;Manual lymph drainage;Passive range of motion;Dry needling;Energy conservation;Taping    PT Next Visit Plan  focus will continue to be on ROM and edema until pt ROM is functional     PT Home Exercise Plan  eval: quad sets, heel slides    Consulted and Agree with Plan of Care  Patient       Patient will benefit from skilled therapeutic intervention in order to improve the following deficits and impairments:  Abnormal gait, Decreased activity tolerance, Decreased balance, Decreased endurance, Decreased mobility, Decreased range of motion, Decreased scar mobility, Decreased strength, Difficulty walking, Hypomobility, Increased edema, Increased fascial restricitons, Increased muscle spasms, Impaired flexibility, Pain  Visit Diagnosis: Stiffness of left  knee, not elsewhere classified  Localized edema  Muscle weakness (generalized)  Difficulty in walking, not elsewhere classified     Problem List Patient Active Problem List   Diagnosis Date Noted  . Obese 02/28/2018  . S/P left TKA 02/25/2018  . S/P total knee replacement 02/25/2018  . Fever 10/09/2016  . Community acquired pneumonia of left lower lobe of lung ([place]) 10/09/2016  . Gram negative sepsis ([place]) 02/05/2014  . UTI (urinary tract infection) 02/04/2014  . Sepsis ([place]) 02/04/2014  . DM type 2 (diabetes mellitus, type 2) ([place]) 02/04/2014  . Tobacco abuse 02/04/2014  . Preop cardiovascular exam 08/05/2013  . CAD (coronary artery disease) 08/05/2012  . Degenerative lumbar spinal stenosis 04/03/2012  . Chest pain 04/03/2012  . Anemia 04/03/2012  . Smoking 06/13/2011  . JOINT EFFUSION, KNEE 01/11/2010  . ARTHRITIS, RIGHT KNEE 12/12/2009  . PAIN IN JOINT, LOWER LEG 12/12/2009  . PROSTATE CANCER 11/08/2009  . GOUTY ARTHRITIS, CHRONIC 11/08/2009  . Overweight 11/08/2009  . HYPERTENSION, BENIGN 11/08/2009  . Carotid artery disease without cerebral infarction ([place]) 11/08/2009  . Malignant neoplasm of bladder ([place]) 03/22/2008  . Hyperlipidemia 03/22/2008  . PAD (peripheral artery disease) ([place]) 03/22/2008  . CONSTIPATION, CHRONIC 03/22/2008  . DEGENERATIVE JOINT DISEASE 03/22/2008  . OSA (obstructive sleep apnea) 03/22/2008  . PROSTATE CANCER, HX OF 03/22/2008  . DIVERTICULITIS, HX OF 03/22/2008    Rayetta Humphrey, PT CLT [phone] 03/10/2018, 1:46 PM  [facility] [address] [place], [place], [address] Phone: [phone]   Fax:  [phone]  Name: [name] MRN: [id number] Date of Birth: 05-10-1946

## 2018-03-12 ENCOUNTER — Ambulatory Visit (HOSPITAL_COMMUNITY): Payer: Medicare HMO

## 2018-03-12 DIAGNOSIS — R600 Localized edema: Secondary | ICD-10-CM | POA: Diagnosis not present

## 2018-03-12 DIAGNOSIS — M6281 Muscle weakness (generalized): Secondary | ICD-10-CM

## 2018-03-12 DIAGNOSIS — M25662 Stiffness of left knee, not elsewhere classified: Secondary | ICD-10-CM

## 2018-03-12 DIAGNOSIS — R262 Difficulty in walking, not elsewhere classified: Secondary | ICD-10-CM

## 2018-03-12 NOTE — Therapy (Signed)
[place], [place], [address] Phone: [phone]   Fax:  [phone]  Physical Therapy Treatment  Patient Details  Name: [name] MRN: [id number] Date of Birth: 07-03-1946 Referring Provider: Paralee Cancel, MD   Encounter Date: 03/12/2018  PT End of Session - 03/12/18 1425    Visit Number  5    Number of Visits  19    Date for PT Re-Evaluation  04/15/18 mini reassess on 03/25/18    Authorization Type  [employer]    Authorization Time Period  03/04/18 to 04/15/18    Authorization - Visit Number  5    Authorization - Number of Visits  10    PT Start Time  2637    PT Stop Time  1505    PT Time Calculation (min)  40 min    Equipment Utilized During Treatment  Gait belt    Activity Tolerance  Patient tolerated treatment well    Behavior During Therapy  [facility] for tasks assessed/performed       Past Medical History:  Diagnosis Date  . Bilateral renal cysts   . Bladder cancer ([place]) UROLOGIST-  DR [facility]   RECURRENT 11/ 2017 --  hx turbt in 2004 and 08-19-2013  . BPH without obstruction/lower urinary tract symptoms   . Cervical fusion syndrome    LEFT ARM NUMBNESS--  CONTROLLED W/ GABAPENTIN  . Coronary artery disease    CARDIOLOGIST-- DR BRANCH [name])  . DDD (degenerative disc disease), cervical   . DDD (degenerative disc disease), lumbar   . Diverticulosis of sigmoid colon   . History of acute gouty arthritis   . History of adenomatous polyp of colon    2001;  2012- non-malignant leiomyoma;  04-07-2014  tubualr adenoma and hyperplastic polyp's  . History of prostate cancer UROLOGIST-  DR [facility]   S/P  RADIOACTIVE SEED IMPLANTS 2004  . HOH (hard of hearing)    no hearing aids  . Hyperlipidemia   . Hypertension   . OA (osteoarthritis)   . Occlusion and stenosis of carotid artery without mention of cerebral infarction CARDIOLOGIST  -- DR  [name]   LAST DUPLEX  11-23-2015  RICA   (DUPLEX DOPPLER 01-20-2016 RICA 85-88% PROXIMAL) /   LICA <50% (HAD CONSULT W/ [name] , VASCULAR SURGEON 01-20-2016)  . OSA on CPAP    BiPAP  . Spondylosis, cervical   . Type 2 diabetes mellitus ([place])   . Wears glasses     Past Surgical History:  Procedure Laterality Date  . ANTERIOR REMOVAL CAGE AND PLATE C3-C7/ CORPECTOMY C7 (FX)/  ALLOGRAFT AND FUSION C3 -- T1/  POSTERIOR DECOMPRESSION BILATERAL LAMINECTOMY C4 -- 6 & PARTIAL C3/  POSTEROLATERAL ARTHRODESIS C3 - T1  06/05/2005  . APPENDECTOMY  1990'S  . CATARACT EXTRACTION W/ INTRAOCULAR LENS  IMPLANT, BILATERAL  2013  . CERVICAL FUSION  05/11/2005   C3  --  C7/  due to post op quadriparesis same day s/p  anterior C 4,5,6, colpectomy, decompression, removal epidural hematoma, foraminnotomy, cage and plate  . COLONOSCOPY  last one 04-07-2014  . CYSTOSCOPY W/ RETROGRADES Bilateral 08/19/2013   Procedure: CYSTOSCOPY WITH RETROGRADE PYELOGRAM;  Surgeon: Molli Hazard, MD;  Location[facility];  Service: Urology;  Laterality: Bilateral;  . CYSTOSCOPY W/ RETROGRADES Bilateral 07/18/2016   Procedure: CYSTOSCOPY WITH RETROGRADE PYELOGRAM;  Surgeon: Alexis Frock, MD;  Location[facility];  Service: Urology;  Laterality: Bilateral;  .  ENDOSCOPIC REPAIR CSF LEAK VIA NASAL PASSAGE  2011  . INTRAOPERATIVE ARTERIOGRAM  CATH LAB  01-05-2009  DR Einar Gip   RICA   ACUTE ANGLE 80-85% STENOSIS  . KNEE ARTHROSCOPY Left 07/15/2017   Procedure: ARTHROSCOPY LEFT  KNEE AND DEBRIDEMENT, medial meniscal tear and chondromalaita;  Surgeon: Paralee Cancel, MD;  Location: [facility];  Service: Orthopedics;  Laterality: Left;  60 MINS  . LUMBAR FUSION  03-26-2012   L3 --  L5  . RADIOACTIVE PROSTATE SEED IMPLANTS  08-19-2003  . SEPTOPLASTY  1998  . TOTAL KNEE ARTHROPLASTY Left 02/25/2018   Procedure: LEFT TOTAL KNEE ARTHROPLASTY;  Surgeon: Paralee Cancel, MD;  Location: [facility];  Service: Orthopedics;  Laterality: Left;  70 mins  .  TRANSURETHRAL RESECTION OF BLADDER TUMOR N/A 08/19/2013   Procedure: TRANSURETHRAL RESECTION OF BLADDER TUMOR (TURBT);  Surgeon: Molli Hazard, MD;  Location: [facility];  Service: Urology;  Laterality: N/A;  . TRANSURETHRAL RESECTION OF BLADDER TUMOR N/A 07/18/2016   Procedure: TRANSURETHRAL RESECTION OF BLADDER TUMOR (TURBT);  Surgeon: Alexis Frock, MD;  Location: [facility];  Service: Urology;  Laterality: N/A;  . YAG LASER APPLICATION  60/45/4098   Procedure: YAG LASER APPLICATION;  Surgeon: Elta Guadeloupe T. Gershon Crane, MD;  Location: [facility];  Service: Ophthalmology;  Laterality: Right;    There were no vitals filed for this visit.  Subjective Assessment - 03/12/18 1425    Subjective  Pt reports he saw his MD this monring who was pleased with his progress thus far. He goes back in 1 month.    Limitations  Standing;Walking;House hold activities    How long can you sit comfortably?  62min in recliner; 15 mins with legs in dependent position    How long can you stand comfortably?  hasn't tested it, probably 15 mins    How long can you walk comfortably?  has only been walking in the house    Patient Stated Goals  get better in a hurry    Currently in Pain?  Yes    Pain Score  4     Pain Location  Knee    Pain Orientation  Right;Lateral;Anterior    Pain Descriptors / Indicators  Sore    Pain Type  Surgical pain    Pain Onset  1 to 4 weeks ago    Pain Frequency  Intermittent    Aggravating Factors   exercises    Pain Relieving Factors  ice    Effect of Pain on Daily Activities  limits           [facility] Adult PT Treatment/Exercise - 03/12/18 0001      Knee/Hip Exercises: Stretches   Active Hamstring Stretch  Left;3 reps;30 seconds;Limitations    Active Hamstring Stretch Limitations  standing 12" step    Knee: Self-Stretch to increase Flexion  Left    Knee: Self-Stretch Limitations  10x10" on 12" step    Gastroc Stretch  Both;3 reps;30 seconds    Gastroc  Stretch Limitations  slant board      Knee/Hip Exercises: Standing   Heel Raises  Both;15 reps    Heel Raises Limitations  heel and toe    Knee Flexion  Left;10 reps    Terminal Knee Extension  Left;10 reps;Limitations    Terminal Knee Extension Limitations  RTB x5" holds    Rocker Board  2 minutes;Limitations    Rocker Board Limitations  R/L    Gait Training  x1 lap (226 feet) around  gym with RW focusing on heel to toe gait      Knee/Hip Exercises: Seated   Long Arc Quad  Left;10 reps      Knee/Hip Exercises: Supine   Quad Sets  Left;15 reps    [employer] Limitations  3-5" holds    Short Arc [employer]  Left;15 reps    Short Arc Quad Sets Limitations  bolster, 3-5" holds    Knee Extension Limitations  18    Knee Flexion Limitations  104      Manual Therapy   Manual Therapy  Edema management;Joint mobilization    Manual therapy comments  completed separate rest of treatment    Edema Management  Retrograde massage to patient's left lower extremity with bilateral lower extremities elevated. Patient supine, due to noted edema in left lower extremity.     Joint Mobilization  very light grade I patellar mobs for improved knee mobility             PT Education - 03/12/18 1425    Education Details  exercise technique    Person(s) Educated  Patient    Methods  Explanation;Demonstration    Comprehension  Verbalized understanding;Returned demonstration       PT Short Term Goals - 03/10/18 1344      PT SHORT TERM GOAL #1   Title  Pt will be independent with HEP and perform consistently in order to improve ROM and decrease pain.    Time  3    Period  Weeks    Status  On-going      PT SHORT TERM GOAL #2   Title  Pt will have decreased joint line edema by 2cm or > in order to maximize ROM and decrease pain.    Time  3    Period  Weeks    Status  On-going      PT SHORT TERM GOAL #3   Title  Pt will have improved L knee AROM from 10-95 deg in order to maximize gait and  decrease pain.    Time  3    Period  Weeks    Status  On-going      PT SHORT TERM GOAL #4   Title  Pt will have 1/2 grade throughout BLE MMT in order to maximize functional mobility and decrease pain.     Time  3    Period  Weeks    Status  On-going        PT Long Term Goals - 03/10/18 1344      PT LONG TERM GOAL #1   Title  Pt will have improved L knee AROM from 5-115deg in order to further maximize gait, stair ambulation, and allow him to return to gardening with greater ease.    Time  6    Period  Weeks    Status  On-going      PT LONG TERM GOAL #2   Title  Pt will have 1 grade improvement throughout BLE MMT in order to maximize functional mobility, gait on uneven ground, and allow him to perform household chores with greater ease.    Time  6    Period  Weeks    Status  On-going      PT LONG TERM GOAL #3   Title  Pt will be able to perform bil SLS for 20 sec or > in order to maximize stair ambulation and overall gait mechanics.    Time  6    Period  Weeks    Status  On-going      PT LONG TERM GOAL #4   Title  Pt will have 163ft improvement or > during 3MWT wtih LRAD in order to demo improved functional strength and maximize his ability to return to yardwork.    Time  6    Period  Weeks    Status  On-going      PT LONG TERM GOAL #5   Title  Pt will be able to perform 5xSTS  in 12 sec or < without UE and proper mechanics in order to demo improved functional strength and maximize pt's transfers.     Time  6    Period  Weeks    Status  On-going            Plan - 03/12/18 1506    Clinical Impression Statement  Pt states he saw his PA-C this morning who was pleased with his progress thus far. He is to return in 1 month. Continued with focus on ROM this date. Ended with manual for edema and patellar mobility. AROM 18to 104deg this date. Continue as planned, progressing as able    Rehab Potential  Good    PT Frequency  3x / week    PT Duration  6 weeks    PT  Treatment/Interventions  ADLs/Self Care Home Management;Biofeedback;Cryotherapy;Electrical Stimulation;Ultrasound;DME Instruction;Gait training;Stair training;Functional mobility training;Therapeutic activities;Therapeutic exercise;Balance training;Neuromuscular re-education;Patient/family education;Orthotic Fit/Training;Manual techniques;Manual lymph drainage;Passive range of motion;Dry needling;Energy conservation;Taping    PT Next Visit Plan  focus will continue to be on ROM and edema until pt ROM is functional; continue patellar mobility    PT Home Exercise Plan  eval: quad sets, heel slides    Consulted and Agree with Plan of Care  Patient       Patient will benefit from skilled therapeutic intervention in order to improve the following deficits and impairments:  Abnormal gait, Decreased activity tolerance, Decreased balance, Decreased endurance, Decreased mobility, Decreased range of motion, Decreased scar mobility, Decreased strength, Difficulty walking, Hypomobility, Increased edema, Increased fascial restricitons, Increased muscle spasms, Impaired flexibility, Pain  Visit Diagnosis: Stiffness of left knee, not elsewhere classified  Localized edema  Muscle weakness (generalized)  Difficulty in walking, not elsewhere classified     Problem List Patient Active Problem List   Diagnosis Date Noted  . Obese 02/28/2018  . S/P left TKA 02/25/2018  . S/P total knee replacement 02/25/2018  . Fever 10/09/2016  . Community acquired pneumonia of left lower lobe of lung ([place]) 10/09/2016  . Gram negative sepsis ([place]) 02/05/2014  . UTI (urinary tract infection) 02/04/2014  . Sepsis ([place]) 02/04/2014  . DM type 2 (diabetes mellitus, type 2) ([place]) 02/04/2014  . Tobacco abuse 02/04/2014  . Preop cardiovascular exam 08/05/2013  . CAD (coronary artery disease) 08/05/2012  . Degenerative lumbar spinal stenosis 04/03/2012  . Chest pain 04/03/2012  . Anemia 04/03/2012  . Smoking 06/13/2011  .  JOINT EFFUSION, KNEE 01/11/2010  . ARTHRITIS, RIGHT KNEE 12/12/2009  . PAIN IN JOINT, LOWER LEG 12/12/2009  . PROSTATE CANCER 11/08/2009  . GOUTY ARTHRITIS, CHRONIC 11/08/2009  . Overweight 11/08/2009  . HYPERTENSION, BENIGN 11/08/2009  . Carotid artery disease without cerebral infarction ([place]) 11/08/2009  . Malignant neoplasm of bladder ([place]) 03/22/2008  . Hyperlipidemia 03/22/2008  . PAD (peripheral artery disease) ([place]) 03/22/2008  . CONSTIPATION, CHRONIC 03/22/2008  . DEGENERATIVE JOINT DISEASE 03/22/2008  . OSA (obstructive sleep apnea) 03/22/2008  . PROSTATE CANCER, HX OF 03/22/2008  . DIVERTICULITIS,  HX OF 03/22/2008       Geraldine Solar PT, DPT  [place] [address] [place], [place], [address] Phone: [phone]   Fax:  [phone]  Name: LAMERE LIGHTNER MRN: [id number] Date of Birth: 24-May-1946

## 2018-03-14 ENCOUNTER — Ambulatory Visit (HOSPITAL_COMMUNITY): Payer: Medicare HMO

## 2018-03-14 ENCOUNTER — Encounter (HOSPITAL_COMMUNITY): Payer: Self-pay

## 2018-03-14 DIAGNOSIS — M6281 Muscle weakness (generalized): Secondary | ICD-10-CM

## 2018-03-14 DIAGNOSIS — R262 Difficulty in walking, not elsewhere classified: Secondary | ICD-10-CM | POA: Diagnosis not present

## 2018-03-14 DIAGNOSIS — M25662 Stiffness of left knee, not elsewhere classified: Secondary | ICD-10-CM

## 2018-03-14 DIAGNOSIS — R600 Localized edema: Secondary | ICD-10-CM

## 2018-03-14 NOTE — Therapy (Signed)
[place], [place], [address] Phone: [phone]   Fax:  [phone]  Physical Therapy Treatment  Patient Details  Name: SUMMER PARTHASARATHY MRN: [id number] Date of Birth: April 18, 1946 Referring Provider: Paralee Cancel, MD   Encounter Date: 03/14/2018  PT End of Session - 03/14/18 1604    Visit Number  6    Number of Visits  19    Date for PT Re-Evaluation  04/15/18 Minireassess 03/25/18    Authorization Type  [employer]    Authorization Time Period  03/04/18 to 04/15/18    Authorization - Visit Number  6    Authorization - Number of Visits  10    PT Start Time  2423    PT Stop Time  1646    PT Time Calculation (min)  43 min    Equipment Utilized During Treatment  Gait belt    Activity Tolerance  Patient tolerated treatment well    Behavior During Therapy  [facility] for tasks assessed/performed       Past Medical History:  Diagnosis Date  . Bilateral renal cysts   . Bladder cancer ([place]) UROLOGIST-  DR [facility]   RECURRENT 11/ 2017 --  hx turbt in 2004 and 08-19-2013  . BPH without obstruction/lower urinary tract symptoms   . Cervical fusion syndrome    LEFT ARM NUMBNESS--  CONTROLLED W/ GABAPENTIN  . Coronary artery disease    CARDIOLOGIST-- DR BRANCH [name])  . DDD (degenerative disc disease), cervical   . DDD (degenerative disc disease), lumbar   . Diverticulosis of sigmoid colon   . History of acute gouty arthritis   . History of adenomatous polyp of colon    2001;  2012- non-malignant leiomyoma;  04-07-2014  tubualr adenoma and hyperplastic polyp's  . History of prostate cancer UROLOGIST-  DR [facility]   S/P  RADIOACTIVE SEED IMPLANTS 2004  . HOH (hard of hearing)    no hearing aids  . Hyperlipidemia   . Hypertension   . OA (osteoarthritis)   . Occlusion and stenosis of carotid artery without mention of cerebral infarction CARDIOLOGIST  -- DR  [name]   LAST DUPLEX  11-23-2015  RICA  (DUPLEX  DOPPLER 01-20-2016 RICA 53-61% PROXIMAL) /   LICA <44% (HAD CONSULT W/ [name] , VASCULAR SURGEON 01-20-2016)  . OSA on CPAP    BiPAP  . Spondylosis, cervical   . Type 2 diabetes mellitus ([place])   . Wears glasses     Past Surgical History:  Procedure Laterality Date  . ANTERIOR REMOVAL CAGE AND PLATE C3-C7/ CORPECTOMY C7 (FX)/  ALLOGRAFT AND FUSION C3 -- T1/  POSTERIOR DECOMPRESSION BILATERAL LAMINECTOMY C4 -- 6 & PARTIAL C3/  POSTEROLATERAL ARTHRODESIS C3 - T1  06/05/2005  . APPENDECTOMY  1990'S  . CATARACT EXTRACTION W/ INTRAOCULAR LENS  IMPLANT, BILATERAL  2013  . CERVICAL FUSION  05/11/2005   C3  --  C7/  due to post op quadriparesis same day s/p  anterior C 4,5,6, colpectomy, decompression, removal epidural hematoma, foraminnotomy, cage and plate  . COLONOSCOPY  last one 04-07-2014  . CYSTOSCOPY W/ RETROGRADES Bilateral 08/19/2013   Procedure: CYSTOSCOPY WITH RETROGRADE PYELOGRAM;  Surgeon: Molli Hazard, MD;  Location: [facility];  Service: Urology;  Laterality: Bilateral;  . CYSTOSCOPY W/ RETROGRADES Bilateral 07/18/2016   Procedure: CYSTOSCOPY WITH RETROGRADE PYELOGRAM;  Surgeon: Alexis Frock, MD;  [facility];  Service: Urology;  Laterality: Bilateral;  .  ENDOSCOPIC REPAIR CSF LEAK VIA NASAL PASSAGE  2011  . INTRAOPERATIVE ARTERIOGRAM  CATH LAB  01-05-2009  DR Einar Gip   RICA   ACUTE ANGLE 80-85% STENOSIS  . KNEE ARTHROSCOPY Left 07/15/2017   Procedure: ARTHROSCOPY LEFT  KNEE AND DEBRIDEMENT, medial meniscal tear and chondromalaita;  Surgeon: Paralee Cancel, MD;  Location: [facility];  Service: Orthopedics;  Laterality: Left;  60 MINS  . LUMBAR FUSION  03-26-2012   L3 --  L5  . RADIOACTIVE PROSTATE SEED IMPLANTS  08-19-2003  . SEPTOPLASTY  1998  . TOTAL KNEE ARTHROPLASTY Left 02/25/2018   Procedure: LEFT TOTAL KNEE ARTHROPLASTY;  Surgeon: Paralee Cancel, MD;  Location: [facility];  Service: Orthopedics;  Laterality: Left;  70 mins  .  TRANSURETHRAL RESECTION OF BLADDER TUMOR N/A 08/19/2013   Procedure: TRANSURETHRAL RESECTION OF BLADDER TUMOR (TURBT);  Surgeon: Molli Hazard, MD;  Location: [facility];  Service: Urology;  Laterality: N/A;  . TRANSURETHRAL RESECTION OF BLADDER TUMOR N/A 07/18/2016   Procedure: TRANSURETHRAL RESECTION OF BLADDER TUMOR (TURBT);  Surgeon: Alexis Frock, MD;  Location: [facility];  Service: Urology;  Laterality: N/A;  . YAG LASER APPLICATION  98/33/8250   Procedure: YAG LASER APPLICATION;  Surgeon: Elta Guadeloupe T. Gershon Crane, MD;  Location: [facility];  Service: Ophthalmology;  Laterality: Right;    There were no vitals filed for this visit.  Subjective Assessment - 03/14/18 1604    Subjective  Pt stated knee is stiff today, pain scale 3/10.  Reports compliance with HEP and has began walking in driveway in morning.      Patient Stated Goals  get better in a hurry    Currently in Pain?  Yes    Pain Score  3     Pain Location  Knee    Pain Orientation  Left    Pain Descriptors / Indicators  Sore;Tightness Stiffness    Pain Type  Surgical pain    Pain Onset  1 to 4 weeks ago    Pain Frequency  Intermittent    Aggravating Factors   exercises    Pain Relieving Factors  ice    Effect of Pain on Daily Activities  limits                        [facility] Adult PT Treatment/Exercise - 03/14/18 0001      Ambulation/Gait   Ambulation Distance (Feet)  -- 226 x 2 with SPC    Assistive device  Straight cane    Gait Pattern  Step-through pattern;Decreased stance time - left;Decreased stride length;Decreased hip/knee flexion - left;Antalgic;Trendelenburg;Lateral trunk lean to right;Decreased weight shift to left cueing for heel to toe mechanics, equal stance and stride le    Gait Comments  cueing for heel to toe mechanics, equal stance and stride length      Knee/Hip Exercises: Stretches   Active Hamstring Stretch  Left;3 reps;30 seconds;Limitations    Active  Hamstring Stretch Limitations  standing 12" step    Knee: Self-Stretch to increase Flexion  Left    Knee: Self-Stretch Limitations  10x10" on 12" step    Gastroc Stretch  Both;3 reps;30 seconds    Gastroc Stretch Limitations  slant board      Knee/Hip Exercises: Standing   Heel Raises  Both;15 reps    Heel Raises Limitations  heel and toe    Terminal Knee Extension  15 reps    Terminal Knee Extension Limitations  RTB x5" holds  Rocker Board  2 minutes;Limitations    Diplomatic Services operational officer Limitations  R/L    Gait Training  565ft with SPC, 2 point sequence and cueing for equal stride length and heel to toe      Knee/Hip Exercises: Supine   Quad Sets  Left;15 reps    [employer] Limitations  3-5" holds    Short Arc [employer]  Left;15 reps    Short Arc Quad Sets Limitations  5" holds    Heel Slides  15 reps    Heel Slides Limitations  2-3" holds    Knee Extension  AROM    Knee Extension Limitations  11    Knee Flexion  AROM    Knee Flexion Limitations  108      Manual Therapy   Manual Therapy  Edema management;Joint mobilization    Manual therapy comments  completed separate rest of treatment    Edema Management  Retrograde massage to patient's left lower extremity with bilateral lower extremities elevated. Patient supine, due to noted edema in left lower extremity.     Joint Mobilization  very light grade I patellar mobs for improved knee mobility               PT Short Term Goals - 03/10/18 1344      PT SHORT TERM GOAL #1   Title  Pt will be independent with HEP and perform consistently in order to improve ROM and decrease pain.    Time  3    Period  Weeks    Status  On-going      PT SHORT TERM GOAL #2   Title  Pt will have decreased joint line edema by 2cm or > in order to maximize ROM and decrease pain.    Time  3    Period  Weeks    Status  On-going      PT SHORT TERM GOAL #3   Title  Pt will have improved L knee AROM from 10-95 deg in order to maximize gait and  decrease pain.    Time  3    Period  Weeks    Status  On-going      PT SHORT TERM GOAL #4   Title  Pt will have 1/2 grade throughout BLE MMT in order to maximize functional mobility and decrease pain.     Time  3    Period  Weeks    Status  On-going        PT Long Term Goals - 03/10/18 1344      PT LONG TERM GOAL #1   Title  Pt will have improved L knee AROM from 5-115deg in order to further maximize gait, stair ambulation, and allow him to return to gardening with greater ease.    Time  6    Period  Weeks    Status  On-going      PT LONG TERM GOAL #2   Title  Pt will have 1 grade improvement throughout BLE MMT in order to maximize functional mobility, gait on uneven ground, and allow him to perform household chores with greater ease.    Time  6    Period  Weeks    Status  On-going      PT LONG TERM GOAL #3   Title  Pt will be able to perform bil SLS for 20 sec or > in order to maximize stair ambulation and overall gait mechanics.    Time  6  Period  Weeks    Status  On-going      PT LONG TERM GOAL #4   Title  Pt will have 165ft improvement or > during 3MWT wtih LRAD in order to demo improved functional strength and maximize his ability to return to yardwork.    Time  6    Period  Weeks    Status  On-going      PT LONG TERM GOAL #5   Title  Pt will be able to perform 5xSTS  in 12 sec or < without UE and proper mechanics in order to demo improved functional strength and maximize pt's transfers.     Time  6    Period  Weeks    Status  On-going            Plan - 03/14/18 1657    Clinical Impression Statement  Pt arrived practically carrying RW, began session with gait training with SPC with good 2 point sequence demonstrated.  Cueing for equal stance phase and stride length with heel to toe mechanics.  Session focus on knee mobility with stretches and quad strengthening.  Improved AROM 11-108 degrees (was 18-104 degrees last session.)  EOS wiht manual technqiues  to address edema proximal knee and patella mobs for mobility.  EOS pain scale 3-4/10 reports of reduced stiffness.      Rehab Potential  Good    PT Frequency  3x / week    PT Duration  6 weeks    PT Treatment/Interventions  ADLs/Self Care Home Management;Biofeedback;Cryotherapy;Electrical Stimulation;Ultrasound;DME Instruction;Gait training;Stair training;Functional mobility training;Therapeutic activities;Therapeutic exercise;Balance training;Neuromuscular re-education;Patient/family education;Orthotic Fit/Training;Manual techniques;Manual lymph drainage;Passive range of motion;Dry needling;Energy conservation;Taping    PT Next Visit Plan  focus will continue to be on ROM and edema until pt ROM is functional; continue patellar mobility;  Begin prone knee hang next session with manual.      PT Home Exercise Plan  eval: quad sets, heel slides       Patient will benefit from skilled therapeutic intervention in order to improve the following deficits and impairments:  Abnormal gait, Decreased activity tolerance, Decreased balance, Decreased endurance, Decreased mobility, Decreased range of motion, Decreased scar mobility, Decreased strength, Difficulty walking, Hypomobility, Increased edema, Increased fascial restricitons, Increased muscle spasms, Impaired flexibility, Pain  Visit Diagnosis: Stiffness of left knee, not elsewhere classified  Localized edema  Muscle weakness (generalized)  Difficulty in walking, not elsewhere classified     Problem List Patient Active Problem List   Diagnosis Date Noted  . Obese 02/28/2018  . S/P left TKA 02/25/2018  . S/P total knee replacement 02/25/2018  . Fever 10/09/2016  . Community acquired pneumonia of left lower lobe of lung ([place]) 10/09/2016  . Gram negative sepsis ([place]) 02/05/2014  . UTI (urinary tract infection) 02/04/2014  . Sepsis ([place]) 02/04/2014  . DM type 2 (diabetes mellitus, type 2) ([place]) 02/04/2014  . Tobacco abuse 02/04/2014  .  Preop cardiovascular exam 08/05/2013  . CAD (coronary artery disease) 08/05/2012  . Degenerative lumbar spinal stenosis 04/03/2012  . Chest pain 04/03/2012  . Anemia 04/03/2012  . Smoking 06/13/2011  . JOINT EFFUSION, KNEE 01/11/2010  . ARTHRITIS, RIGHT KNEE 12/12/2009  . PAIN IN JOINT, LOWER LEG 12/12/2009  . PROSTATE CANCER 11/08/2009  . GOUTY ARTHRITIS, CHRONIC 11/08/2009  . Overweight 11/08/2009  . HYPERTENSION, BENIGN 11/08/2009  . Carotid artery disease without cerebral infarction ([place]) 11/08/2009  . Malignant neoplasm of bladder ([place]) 03/22/2008  . Hyperlipidemia 03/22/2008  . PAD (peripheral artery  disease) ([place]) 03/22/2008  . CONSTIPATION, CHRONIC 03/22/2008  . DEGENERATIVE JOINT DISEASE 03/22/2008  . OSA (obstructive sleep apnea) 03/22/2008  . PROSTATE CANCER, HX OF 03/22/2008  . DIVERTICULITIS, HX OF 03/22/2008   Ihor Austin, [place]; [place]  Aldona Lento 03/14/2018, 5:10 PM  [place], [place], [address] Phone: [phone]   Fax:  [phone]  Name: SAHID BORBA MRN: [id number] Date of Birth: Mar 03, 1946

## 2018-03-17 ENCOUNTER — Ambulatory Visit (HOSPITAL_COMMUNITY): Payer: Medicare HMO | Attending: Orthopedic Surgery

## 2018-03-17 ENCOUNTER — Encounter (HOSPITAL_COMMUNITY): Payer: Self-pay

## 2018-03-17 DIAGNOSIS — M25662 Stiffness of left knee, not elsewhere classified: Secondary | ICD-10-CM | POA: Insufficient documentation

## 2018-03-17 DIAGNOSIS — R600 Localized edema: Secondary | ICD-10-CM | POA: Diagnosis not present

## 2018-03-17 DIAGNOSIS — R262 Difficulty in walking, not elsewhere classified: Secondary | ICD-10-CM

## 2018-03-17 DIAGNOSIS — M6281 Muscle weakness (generalized): Secondary | ICD-10-CM | POA: Diagnosis not present

## 2018-03-17 NOTE — Therapy (Signed)
[place], [place], [address] Phone: [phone]   Fax:  [phone]  Physical Therapy Treatment  Patient Details  Name: [name] MRN: [id number] Date of Birth: 1945-10-29 Referring Provider: Paralee Cancel, MD   Encounter Date: 03/17/2018  PT End of Session - 03/17/18 1528    Visit Number  7    Number of Visits  19    Date for PT Re-Evaluation  04/15/18 Minireassess 03/25/18    Authorization Type  [employer]    Authorization Time Period  03/04/18 to 04/15/18    Authorization - Visit Number  7    Authorization - Number of Visits  10    PT Start Time  0973    PT Stop Time  1615    PT Time Calculation (min)  50 min    Equipment Utilized During Treatment  Gait belt    Activity Tolerance  Patient tolerated treatment well;No increased pain    Behavior During Therapy  WFL for tasks assessed/performed       Past Medical History:  Diagnosis Date  . Bilateral renal cysts   . Bladder cancer ([place]) UROLOGIST-  DR [facility]   RECURRENT 11/ 2017 --  hx turbt in 2004 and 08-19-2013  . BPH without obstruction/lower urinary tract symptoms   . Cervical fusion syndrome    LEFT ARM NUMBNESS--  CONTROLLED W/ GABAPENTIN  . Coronary artery disease    CARDIOLOGIST-- DR BRANCH [name])  . DDD (degenerative disc disease), cervical   . DDD (degenerative disc disease), lumbar   . Diverticulosis of sigmoid colon   . History of acute gouty arthritis   . History of adenomatous polyp of colon    2001;  2012- non-malignant leiomyoma;  04-07-2014  tubualr adenoma and hyperplastic polyp's  . History of prostate cancer UROLOGIST-  DR [facility]   S/P  RADIOACTIVE SEED IMPLANTS 2004  . HOH (hard of hearing)    no hearing aids  . Hyperlipidemia   . Hypertension   . OA (osteoarthritis)   . Occlusion and stenosis of carotid artery without mention of cerebral infarction CARDIOLOGIST  -- DR  [name]   LAST DUPLEX  11-23-2015   RICA  (DUPLEX DOPPLER 01-20-2016 RICA 53-29% PROXIMAL) /   LICA <92% (HAD CONSULT W/ [name] , VASCULAR SURGEON 01-20-2016)  . OSA on CPAP    BiPAP  . Spondylosis, cervical   . Type 2 diabetes mellitus ([place])   . Wears glasses     Past Surgical History:  Procedure Laterality Date  . ANTERIOR REMOVAL CAGE AND PLATE C3-C7/ CORPECTOMY C7 (FX)/  ALLOGRAFT AND FUSION C3 -- T1/  POSTERIOR DECOMPRESSION BILATERAL LAMINECTOMY C4 -- 6 & PARTIAL C3/  POSTEROLATERAL ARTHRODESIS C3 - T1  06/05/2005  . APPENDECTOMY  1990'S  . CATARACT EXTRACTION W/ INTRAOCULAR LENS  IMPLANT, BILATERAL  2013  . CERVICAL FUSION  05/11/2005   C3  --  C7/  due to post op quadriparesis same day s/p  anterior C 4,5,6, colpectomy, decompression, removal epidural hematoma, foraminnotomy, cage and plate  . COLONOSCOPY  last one 04-07-2014  . CYSTOSCOPY W/ RETROGRADES Bilateral 08/19/2013   Procedure: CYSTOSCOPY WITH RETROGRADE PYELOGRAM;  Surgeon: Molli Hazard, MD;  Location[facility];  Service: Urology;  Laterality: Bilateral;  . CYSTOSCOPY W/ RETROGRADES Bilateral 07/18/2016   Procedure: CYSTOSCOPY WITH RETROGRADE PYELOGRAM;  Surgeon: Alexis Frock, MD;  Location: [facility];  Service: Urology;  Laterality: Bilateral;  .  ENDOSCOPIC REPAIR CSF LEAK VIA NASAL PASSAGE  2011  . INTRAOPERATIVE ARTERIOGRAM  CATH LAB  01-05-2009  DR Einar Gip   RICA   ACUTE ANGLE 80-85% STENOSIS  . KNEE ARTHROSCOPY Left 07/15/2017   Procedure: ARTHROSCOPY LEFT  KNEE AND DEBRIDEMENT, medial meniscal tear and chondromalaita;  Surgeon: Paralee Cancel, MD;  Location: [facility];  Service: Orthopedics;  Laterality: Left;  60 MINS  . LUMBAR FUSION  03-26-2012   L3 --  L5  . RADIOACTIVE PROSTATE SEED IMPLANTS  08-19-2003  . SEPTOPLASTY  1998  . TOTAL KNEE ARTHROPLASTY Left 02/25/2018   Procedure: LEFT TOTAL KNEE ARTHROPLASTY;  Surgeon: Paralee Cancel, MD;  Location: [facility];  Service: Orthopedics;  Laterality: Left;  70 mins   . TRANSURETHRAL RESECTION OF BLADDER TUMOR N/A 08/19/2013   Procedure: TRANSURETHRAL RESECTION OF BLADDER TUMOR (TURBT);  Surgeon: Molli Hazard, MD;  Location: [facility];  Service: Urology;  Laterality: N/A;  . TRANSURETHRAL RESECTION OF BLADDER TUMOR N/A 07/18/2016   Procedure: TRANSURETHRAL RESECTION OF BLADDER TUMOR (TURBT);  Surgeon: Alexis Frock, MD;  Location: [facility];  Service: Urology;  Laterality: N/A;  . YAG LASER APPLICATION  41/66/0630   Procedure: YAG LASER APPLICATION;  Surgeon: Elta Guadeloupe T. Gershon Crane, MD;  Location: [facility];  Service: Ophthalmology;  Laterality: Right;    There were no vitals filed for this visit.  Subjective Assessment - 03/17/18 1522    Subjective  Pt stated knee is sore at knee cap today, pain scale 3/10.  Reports compliance HEP daily and walking in driveway.  Feels he overdid it yesterday walking.      Patient Stated Goals  get better in a hurry    Currently in Pain?  Yes    Pain Score  3     Pain Location  Knee    Pain Descriptors / Indicators  Sore    Pain Type  Surgical pain    Pain Onset  1 to 4 weeks ago    Pain Frequency  Intermittent    Aggravating Factors   exercises    Pain Relieving Factors  ice    Effect of Pain on Daily Activities  limits                       [facility] Adult PT Treatment/Exercise - 03/17/18 0001      Exercises   Exercises  Knee/Hip      Knee/Hip Exercises: Stretches   Active Hamstring Stretch  Left;3 reps;30 seconds;Limitations    Active Hamstring Stretch Limitations  standing 12" step with light pressure    Knee: Self-Stretch to increase Flexion  Left    Knee: Self-Stretch Limitations  10x10" on 12" step    Gastroc Stretch  Both;3 reps;30 seconds    Gastroc Stretch Limitations  slant board      Knee/Hip Exercises: Standing   Heel Raises  Both;15 reps    Heel Raises Limitations  heel and toe    Terminal Knee Extension  15 reps    Terminal Knee Extension  Limitations  RTB x5" holds    Rocker Board  2 minutes;Limitations    Rocker Board Limitations  R/L and Df/Pf    Gait Training  226 supervision with SPC, good 2 point sequence; cueing for heel to toe sequence and stride length      Knee/Hip Exercises: Supine   Short Arc [employer]  Left;15 reps    Short Arc Quad Sets Limitations  5" holds  Heel Slides  15 reps    Heel Slides Limitations  2-3" holds    Knee Extension  AROM    Knee Extension Limitations  8    Knee Flexion Limitations  113      Knee/Hip Exercises: Prone   Prone Knee Hang  2 minutes;Limitations    Prone Knee Hang Limitations  with manual to hamstrings      Manual Therapy   Manual Therapy  Edema management;Joint mobilization    Manual therapy comments  completed separate rest of treatment    Edema Management  Retrograde massage to patient's left lower extremity with bilateral lower extremities elevated. Patient supine, due to noted edema in left lower extremity.     Joint Mobilization  very light grade I patellar mobs for improved knee mobility               PT Short Term Goals - 03/10/18 1344      PT SHORT TERM GOAL #1   Title  Pt will be independent with HEP and perform consistently in order to improve ROM and decrease pain.    Time  3    Period  Weeks    Status  On-going      PT SHORT TERM GOAL #2   Title  Pt will have decreased joint line edema by 2cm or > in order to maximize ROM and decrease pain.    Time  3    Period  Weeks    Status  On-going      PT SHORT TERM GOAL #3   Title  Pt will have improved L knee AROM from 10-95 deg in order to maximize gait and decrease pain.    Time  3    Period  Weeks    Status  On-going      PT SHORT TERM GOAL #4   Title  Pt will have 1/2 grade throughout BLE MMT in order to maximize functional mobility and decrease pain.     Time  3    Period  Weeks    Status  On-going        PT Long Term Goals - 03/10/18 1344      PT LONG TERM GOAL #1   Title  Pt  will have improved L knee AROM from 5-115deg in order to further maximize gait, stair ambulation, and allow him to return to gardening with greater ease.    Time  6    Period  Weeks    Status  On-going      PT LONG TERM GOAL #2   Title  Pt will have 1 grade improvement throughout BLE MMT in order to maximize functional mobility, gait on uneven ground, and allow him to perform household chores with greater ease.    Time  6    Period  Weeks    Status  On-going      PT LONG TERM GOAL #3   Title  Pt will be able to perform bil SLS for 20 sec or > in order to maximize stair ambulation and overall gait mechanics.    Time  6    Period  Weeks    Status  On-going      PT LONG TERM GOAL #4   Title  Pt will have 143ft improvement or > during 3MWT wtih LRAD in order to demo improved functional strength and maximize his ability to return to yardwork.    Time  6    Period  Weeks  Status  On-going      PT LONG TERM GOAL #5   Title  Pt will be able to perform 5xSTS  in 12 sec or < without UE and proper mechanics in order to demo improved functional strength and maximize pt's transfers.     Time  6    Period  Weeks    Status  On-going            Plan - 03/17/18 1620    Clinical Impression Statement  Pt arrvied with RW though session complete with SPC.  Pt able to demonstrate good mechanics with cane, encouraged to bring his own into session next session.  Session focus with knee mobility.  Added  prone knee hang with manual to address soft tissue restrictions wiht hamstrings, pt able to tolerate 2 minute hanging.  Continued wiht gentle patella mobs all directions with reports of some pain medial and lateral though improved with reps and retro massage for edema control.  AROM at EOS 8-113 degrees (was 11-108 degrees last session.)    Rehab Potential  Good    PT Frequency  3x / week    PT Duration  6 weeks    PT Treatment/Interventions  ADLs/Self Care Home  Management;Biofeedback;Cryotherapy;Electrical Stimulation;Ultrasound;DME Instruction;Gait training;Stair training;Functional mobility training;Therapeutic activities;Therapeutic exercise;Balance training;Neuromuscular re-education;Patient/family education;Orthotic Fit/Training;Manual techniques;Manual lymph drainage;Passive range of motion;Dry needling;Energy conservation;Taping    PT Next Visit Plan  focus will continue to be on ROM and edema until pt ROM is functional; continue patellar mobility; Continue prone knee hang next session with manual.  Encouraged pt to bring in his cane next session to assure correct height and good mechanics before advancing to LRAD.    PT Home Exercise Plan  eval: quad sets, heel slides       Patient will benefit from skilled therapeutic intervention in order to improve the following deficits and impairments:  Abnormal gait, Decreased activity tolerance, Decreased balance, Decreased endurance, Decreased mobility, Decreased range of motion, Decreased scar mobility, Decreased strength, Difficulty walking, Hypomobility, Increased edema, Increased fascial restricitons, Increased muscle spasms, Impaired flexibility, Pain  Visit Diagnosis: Stiffness of left knee, not elsewhere classified  Localized edema  Muscle weakness (generalized)  Difficulty in walking, not elsewhere classified     Problem List Patient Active Problem List   Diagnosis Date Noted  . Obese 02/28/2018  . S/P left TKA 02/25/2018  . S/P total knee replacement 02/25/2018  . Fever 10/09/2016  . Community acquired pneumonia of left lower lobe of lung ([place]) 10/09/2016  . Gram negative sepsis ([place]) 02/05/2014  . UTI (urinary tract infection) 02/04/2014  . Sepsis ([place]) 02/04/2014  . DM type 2 (diabetes mellitus, type 2) ([place]) 02/04/2014  . Tobacco abuse 02/04/2014  . Preop cardiovascular exam 08/05/2013  . CAD (coronary artery disease) 08/05/2012  . Degenerative lumbar spinal stenosis 04/03/2012   . Chest pain 04/03/2012  . Anemia 04/03/2012  . Smoking 06/13/2011  . JOINT EFFUSION, KNEE 01/11/2010  . ARTHRITIS, RIGHT KNEE 12/12/2009  . PAIN IN JOINT, LOWER LEG 12/12/2009  . PROSTATE CANCER 11/08/2009  . GOUTY ARTHRITIS, CHRONIC 11/08/2009  . Overweight 11/08/2009  . HYPERTENSION, BENIGN 11/08/2009  . Carotid artery disease without cerebral infarction ([place]) 11/08/2009  . Malignant neoplasm of bladder ([place]) 03/22/2008  . Hyperlipidemia 03/22/2008  . PAD (peripheral artery disease) ([place]) 03/22/2008  . CONSTIPATION, CHRONIC 03/22/2008  . DEGENERATIVE JOINT DISEASE 03/22/2008  . OSA (obstructive sleep apnea) 03/22/2008  . PROSTATE CANCER, HX OF 03/22/2008  . DIVERTICULITIS, HX OF  03/22/2008   Ihor Austin, [place]; [place]  Aldona Lento 03/17/2018, 4:27 PM  [place] [address] [place], [place], [address] Phone: [phone]   Fax:  [phone]  Name: [name] MRN: [id number] Date of Birth: 07-20-1946

## 2018-03-19 ENCOUNTER — Ambulatory Visit (HOSPITAL_COMMUNITY): Payer: Medicare HMO

## 2018-03-19 ENCOUNTER — Encounter (HOSPITAL_COMMUNITY): Payer: Self-pay

## 2018-03-19 DIAGNOSIS — M25662 Stiffness of left knee, not elsewhere classified: Secondary | ICD-10-CM | POA: Diagnosis not present

## 2018-03-19 DIAGNOSIS — R262 Difficulty in walking, not elsewhere classified: Secondary | ICD-10-CM | POA: Diagnosis not present

## 2018-03-19 DIAGNOSIS — M6281 Muscle weakness (generalized): Secondary | ICD-10-CM | POA: Diagnosis not present

## 2018-03-19 DIAGNOSIS — R600 Localized edema: Secondary | ICD-10-CM | POA: Diagnosis not present

## 2018-03-19 NOTE — Therapy (Signed)
[place], [place], [address] Phone: [phone]   Fax:  [phone]  Physical Therapy Treatment  Patient Details  Name: [name] MRN: [id number] Date of Birth: 03-27-1946 Referring Provider: Paralee Cancel, MD   Encounter Date: 03/19/2018  PT End of Session - 03/19/18 1700    Visit Number  8    Number of Visits  19    Date for PT Re-Evaluation  04/15/18 Minireassess 03/25/18    Authorization Type  [employer]    Authorization Time Period  03/04/18 to 04/15/18    Authorization - Visit Number  8    Authorization - Number of Visits  10    PT Start Time  8466    PT Stop Time  1737    PT Time Calculation (min)  45 min    Equipment Utilized During Treatment  Gait belt    Activity Tolerance  Patient tolerated treatment well;No increased pain    Behavior During Therapy  WFL for tasks assessed/performed       Past Medical History:  Diagnosis Date  . Bilateral renal cysts   . Bladder cancer ([place]) UROLOGIST-  DR [facility]   RECURRENT 11/ 2017 --  hx turbt in 2004 and 08-19-2013  . BPH without obstruction/lower urinary tract symptoms   . Cervical fusion syndrome    LEFT ARM NUMBNESS--  CONTROLLED W/ GABAPENTIN  . Coronary artery disease    CARDIOLOGIST-- DR BRANCH [name])  . DDD (degenerative disc disease), cervical   . DDD (degenerative disc disease), lumbar   . Diverticulosis of sigmoid colon   . History of acute gouty arthritis   . History of adenomatous polyp of colon    2001;  2012- non-malignant leiomyoma;  04-07-2014  tubualr adenoma and hyperplastic polyp's  . History of prostate cancer UROLOGIST-  DR [facility]   S/P  RADIOACTIVE SEED IMPLANTS 2004  . HOH (hard of hearing)    no hearing aids  . Hyperlipidemia   . Hypertension   . OA (osteoarthritis)   . Occlusion and stenosis of carotid artery without mention of cerebral infarction CARDIOLOGIST  -- DR  [name]   LAST DUPLEX  11-23-2015   RICA  (DUPLEX DOPPLER 01-20-2016 RICA 59-93% PROXIMAL) /   LICA <57% (HAD CONSULT W/ [name] , VASCULAR SURGEON 01-20-2016)  . OSA on CPAP    BiPAP  . Spondylosis, cervical   . Type 2 diabetes mellitus ([place])   . Wears glasses     Past Surgical History:  Procedure Laterality Date  . ANTERIOR REMOVAL CAGE AND PLATE C3-C7/ CORPECTOMY C7 (FX)/  ALLOGRAFT AND FUSION C3 -- T1/  POSTERIOR DECOMPRESSION BILATERAL LAMINECTOMY C4 -- 6 & PARTIAL C3/  POSTEROLATERAL ARTHRODESIS C3 - T1  06/05/2005  . APPENDECTOMY  1990'S  . CATARACT EXTRACTION W/ INTRAOCULAR LENS  IMPLANT, BILATERAL  2013  . CERVICAL FUSION  05/11/2005   C3  --  C7/  due to post op quadriparesis same day s/p  anterior C 4,5,6, colpectomy, decompression, removal epidural hematoma, foraminnotomy, cage and plate  . COLONOSCOPY  last one 04-07-2014  . CYSTOSCOPY W/ RETROGRADES Bilateral 08/19/2013   Procedure: CYSTOSCOPY WITH RETROGRADE PYELOGRAM;  Surgeon: Molli Hazard, MD;  Location: [facility];  Service: Urology;  Laterality: Bilateral;  . CYSTOSCOPY W/ RETROGRADES Bilateral 07/18/2016   Procedure: CYSTOSCOPY WITH RETROGRADE PYELOGRAM;  Surgeon: Alexis Frock, MD;  Location: [facility];  Service: Urology;  Laterality: Bilateral;  .  ENDOSCOPIC REPAIR CSF LEAK VIA NASAL PASSAGE  2011  . INTRAOPERATIVE ARTERIOGRAM  CATH LAB  01-05-2009  DR Einar Gip   RICA   ACUTE ANGLE 80-85% STENOSIS  . KNEE ARTHROSCOPY Left 07/15/2017   Procedure: ARTHROSCOPY LEFT  KNEE AND DEBRIDEMENT, medial meniscal tear and chondromalaita;  Surgeon: Paralee Cancel, MD;  Location: [facility];  Service: Orthopedics;  Laterality: Left;  60 MINS  . LUMBAR FUSION  03-26-2012   L3 --  L5  . RADIOACTIVE PROSTATE SEED IMPLANTS  08-19-2003  . SEPTOPLASTY  1998  . TOTAL KNEE ARTHROPLASTY Left 02/25/2018   Procedure: LEFT TOTAL KNEE ARTHROPLASTY;  Surgeon: Paralee Cancel, MD;  Location: [facility];  Service: Orthopedics;  Laterality: Left;  70 mins   . TRANSURETHRAL RESECTION OF BLADDER TUMOR N/A 08/19/2013   Procedure: TRANSURETHRAL RESECTION OF BLADDER TUMOR (TURBT);  Surgeon: Molli Hazard, MD;  Location: [facility];  Service: Urology;  Laterality: N/A;  . TRANSURETHRAL RESECTION OF BLADDER TUMOR N/A 07/18/2016   Procedure: TRANSURETHRAL RESECTION OF BLADDER TUMOR (TURBT);  Surgeon: Alexis Frock, MD;  Location: [facility];  Service: Urology;  Laterality: N/A;  . YAG LASER APPLICATION  76/72/0947   Procedure: YAG LASER APPLICATION;  Surgeon: Elta Guadeloupe T. Gershon Crane, MD;  Location: [facility];  Service: Ophthalmology;  Laterality: Right;    There were no vitals filed for this visit.  Subjective Assessment - 03/19/18 1655    Subjective  Pt arrived with [facility] with him, stated he mowed yard this morning with riding mower and pain scale 2/10.  Stated he is stiff today following mowing.    Patient Stated Goals  get better in a hurry    Currently in Pain?  Yes    Pain Score  2     Pain Location  Knee    Pain Orientation  Left    Pain Descriptors / Indicators  Sore;Tightness stiffness    Pain Type  Surgical pain    Pain Onset  1 to 4 weeks ago    Pain Frequency  Intermittent    Aggravating Factors   exercises    Pain Relieving Factors  ice    Effect of Pain on Daily Activities  limits         [facility] PT Assessment - 03/19/18 0001      Assessment   Medical Diagnosis  L TKA     Referring Provider  Paralee Cancel, MD    Onset Date/Surgical Date  02/25/18    Next MD Visit  6/27 or 03/14/18    Prior Therapy  none for current TKA, has had PT in past for neck and back surgeries      Circumferential Edema   Circumferential - Left   38  was 43.5 joint line                   [facility] Adult PT Treatment/Exercise - 03/19/18 0001      Ambulation/Gait   Assistive device  Straight cane    Gait Pattern  Step-through pattern;Decreased stance time - left;Decreased stride length;Decreased hip/knee flexion -  left;Antalgic;Trendelenburg;Lateral trunk lean to right;Decreased weight shift to left    Gait Comments  Good mechanics noted      Exercises   Exercises  Knee/Hip      Knee/Hip Exercises: Stretches   Active Hamstring Stretch  Left;3 reps;30 seconds;Limitations    Active Hamstring Stretch Limitations  standing 12" step with light pressure    Quad Stretch  3 reps;30 seconds;Limitations  Quad Stretch Limitations  prone with rope    Knee: Self-Stretch to increase Flexion  Left    Knee: Self-Stretch Limitations  10x10" on 12" step    Gastroc Stretch  Both;3 reps;30 seconds    Gastroc Stretch Limitations  slant board      Knee/Hip Exercises: Standing   Heel Raises  Both;20 reps    Heel Raises Limitations  heel and toe on incline slope    Terminal Knee Extension  2 sets;10 reps    Terminal Knee Extension Limitations  RTB then with towel behind knee for HEP    Rocker Board  2 minutes;Limitations    Rocker Board Limitations  R/L and Df/Pf    Gait Training  226 supervision with SPC, good mechanics noted      Knee/Hip Exercises: Supine   Short Arc [employer]  Left;15 reps    Short Arc Quad Sets Limitations  5" holds    Heel Slides  15 reps    Heel Slides Limitations  2-3" holds    Knee Extension  AROM    Knee Extension Limitations  6    Knee Flexion  AROM    Knee Flexion Limitations  111      Knee/Hip Exercises: Prone   Prone Knee Hang  2 minutes;Limitations    Prone Knee Hang Limitations  with manual to hamstrings      Manual Therapy   Manual Therapy  Edema management;Joint mobilization    Manual therapy comments  completed separate rest of treatment    Edema Management  Retrograde massage to patient's left lower extremity with bilateral lower extremities elevated. Patient supine, due to noted edema in left lower extremity.     Joint Mobilization  very light grade I patellar mobs for improved knee mobility               PT Short Term Goals - 03/10/18 1344      PT SHORT  TERM GOAL #1   Title  Pt will be independent with HEP and perform consistently in order to improve ROM and decrease pain.    Time  3    Period  Weeks    Status  On-going      PT SHORT TERM GOAL #2   Title  Pt will have decreased joint line edema by 2cm or > in order to maximize ROM and decrease pain.    Time  3    Period  Weeks    Status  On-going      PT SHORT TERM GOAL #3   Title  Pt will have improved L knee AROM from 10-95 deg in order to maximize gait and decrease pain.    Time  3    Period  Weeks    Status  On-going      PT SHORT TERM GOAL #4   Title  Pt will have 1/2 grade throughout BLE MMT in order to maximize functional mobility and decrease pain.     Time  3    Period  Weeks    Status  On-going        PT Long Term Goals - 03/10/18 1344      PT LONG TERM GOAL #1   Title  Pt will have improved L knee AROM from 5-115deg in order to further maximize gait, stair ambulation, and allow him to return to gardening with greater ease.    Time  6    Period  Weeks    Status  On-going      PT LONG TERM GOAL #2   Title  Pt will have 1 grade improvement throughout BLE MMT in order to maximize functional mobility, gait on uneven ground, and allow him to perform household chores with greater ease.    Time  6    Period  Weeks    Status  On-going      PT LONG TERM GOAL #3   Title  Pt will be able to perform bil SLS for 20 sec or > in order to maximize stair ambulation and overall gait mechanics.    Time  6    Period  Weeks    Status  On-going      PT LONG TERM GOAL #4   Title  Pt will have 165ft improvement or > during 3MWT wtih LRAD in order to demo improved functional strength and maximize his ability to return to yardwork.    Time  6    Period  Weeks    Status  On-going      PT LONG TERM GOAL #5   Title  Pt will be able to perform 5xSTS  in 12 sec or < without UE and proper mechanics in order to demo improved functional strength and maximize pt's transfers.     Time   6    Period  Weeks    Status  On-going            Plan - 03/19/18 1857    Clinical Impression Statement  Pt brought his own cane, therapist arranged to proper height and session complete with SPC or no AD.  Pt demonstrated proper mechanics with cane, pt encouraged to continue wiht RW for long duration or uneven terrain but may progress to [facility] for indoor and short distance duration.  Continued session focus with knee mobility.  Pt tolerated well to all exercises with minimal difficulty and pain.  Pt continues to be tender with R/L patella mobs though reports improved tolerance following manual.  Improved extensin with AROM at 6-111 degrees  (was 8-113 degrees last session).  Pt with edema reduction following manual this session with knee measurements at 38 cm.  No reoprts of increased pain through session.      Rehab Potential  Good    PT Frequency  3x / week    PT Duration  6 weeks    PT Treatment/Interventions  ADLs/Self Care Home Management;Biofeedback;Cryotherapy;Electrical Stimulation;Ultrasound;DME Instruction;Gait training;Stair training;Functional mobility training;Therapeutic activities;Therapeutic exercise;Balance training;Neuromuscular re-education;Patient/family education;Orthotic Fit/Training;Manual techniques;Manual lymph drainage;Passive range of motion;Dry needling;Energy conservation;Taping    PT Next Visit Plan  focus will continue to be on ROM and edema until pt ROM is functional; continue patellar mobility; Continue prone knee hang next session with manual.      PT Home Exercise Plan  eval: quad sets, heel slides       Patient will benefit from skilled therapeutic intervention in order to improve the following deficits and impairments:  Abnormal gait, Decreased activity tolerance, Decreased balance, Decreased endurance, Decreased mobility, Decreased range of motion, Decreased scar mobility, Decreased strength, Difficulty walking, Hypomobility, Increased edema, Increased  fascial restricitons, Increased muscle spasms, Impaired flexibility, Pain  Visit Diagnosis: Stiffness of left knee, not elsewhere classified  Localized edema  Muscle weakness (generalized)  Difficulty in walking, not elsewhere classified     Problem List Patient Active Problem List   Diagnosis Date Noted  . Obese 02/28/2018  . S/P left TKA 02/25/2018  . S/P total knee replacement 02/25/2018  .  Fever 10/09/2016  . Community acquired pneumonia of left lower lobe of lung ([place]) 10/09/2016  . Gram negative sepsis ([place]) 02/05/2014  . UTI (urinary tract infection) 02/04/2014  . Sepsis ([place]) 02/04/2014  . DM type 2 (diabetes mellitus, type 2) ([place]) 02/04/2014  . Tobacco abuse 02/04/2014  . Preop cardiovascular exam 08/05/2013  . CAD (coronary artery disease) 08/05/2012  . Degenerative lumbar spinal stenosis 04/03/2012  . Chest pain 04/03/2012  . Anemia 04/03/2012  . Smoking 06/13/2011  . JOINT EFFUSION, KNEE 01/11/2010  . ARTHRITIS, RIGHT KNEE 12/12/2009  . PAIN IN JOINT, LOWER LEG 12/12/2009  . PROSTATE CANCER 11/08/2009  . GOUTY ARTHRITIS, CHRONIC 11/08/2009  . Overweight 11/08/2009  . HYPERTENSION, BENIGN 11/08/2009  . Carotid artery disease without cerebral infarction ([place]) 11/08/2009  . Malignant neoplasm of bladder ([place]) 03/22/2008  . Hyperlipidemia 03/22/2008  . PAD (peripheral artery disease) ([place]) 03/22/2008  . CONSTIPATION, CHRONIC 03/22/2008  . DEGENERATIVE JOINT DISEASE 03/22/2008  . OSA (obstructive sleep apnea) 03/22/2008  . PROSTATE CANCER, HX OF 03/22/2008  . DIVERTICULITIS, HX OF 03/22/2008   Ihor Austin, LPTA; [place]  Aldona Lento 03/19/2018, 7:05 PM  [place], [place], [address] Phone: [phone]   Fax:  [phone]  Name: [name] MRN: [id number] Date of Birth: [birth year]

## 2018-03-24 ENCOUNTER — Encounter (HOSPITAL_COMMUNITY): Payer: Self-pay

## 2018-03-24 ENCOUNTER — Ambulatory Visit (HOSPITAL_COMMUNITY): Payer: Medicare HMO

## 2018-03-24 DIAGNOSIS — R262 Difficulty in walking, not elsewhere classified: Secondary | ICD-10-CM

## 2018-03-24 DIAGNOSIS — M6281 Muscle weakness (generalized): Secondary | ICD-10-CM

## 2018-03-24 DIAGNOSIS — R600 Localized edema: Secondary | ICD-10-CM

## 2018-03-24 DIAGNOSIS — M25662 Stiffness of left knee, not elsewhere classified: Secondary | ICD-10-CM

## 2018-03-24 NOTE — Therapy (Signed)
[place], [place], [address] Phone: [phone]   Fax:  [phone]  Physical Therapy Treatment  Patient Details  Name: [name] MRN: [id number] Date of Birth: 1946/02/08 Referring Provider: Paralee Cancel, MD   Encounter Date: 03/24/2018  PT End of Session - 03/24/18 1304    Visit Number  9    Number of Visits  19    Date for PT Re-Evaluation  04/15/18 Minireassess 03/25/18    Authorization Type  [employer]    Authorization Time Period  03/04/18 to 04/15/18    Authorization - Visit Number  9    Authorization - Number of Visits  10    PT Start Time  1300    PT Stop Time  4268    PT Time Calculation (min)  43 min    Equipment Utilized During Treatment  Gait belt    Activity Tolerance  Patient tolerated treatment well;No increased pain    Behavior During Therapy  WFL for tasks assessed/performed       Past Medical History:  Diagnosis Date  . Bilateral renal cysts   . Bladder cancer ([place]) UROLOGIST-  DR [facility]   RECURRENT 11/ 2017 --  hx turbt in 2004 and 08-19-2013  . BPH without obstruction/lower urinary tract symptoms   . Cervical fusion syndrome    LEFT ARM NUMBNESS--  CONTROLLED W/ GABAPENTIN  . Coronary artery disease    CARDIOLOGIST-- DR BRANCH [name])  . DDD (degenerative disc disease), cervical   . DDD (degenerative disc disease), lumbar   . Diverticulosis of sigmoid colon   . History of acute gouty arthritis   . History of adenomatous polyp of colon    2001;  2012- non-malignant leiomyoma;  04-07-2014  tubualr adenoma and hyperplastic polyp's  . History of prostate cancer UROLOGIST-  DR [facility]   S/P  RADIOACTIVE SEED IMPLANTS 2004  . HOH (hard of hearing)    no hearing aids  . Hyperlipidemia   . Hypertension   . OA (osteoarthritis)   . Occlusion and stenosis of carotid artery without mention of cerebral infarction CARDIOLOGIST  -- DR  [name]   LAST DUPLEX  11-23-2015   RICA  (DUPLEX DOPPLER 01-20-2016 RICA 34-19% PROXIMAL) /   LICA <62% (HAD CONSULT W/ [name] , VASCULAR SURGEON 01-20-2016)  . OSA on CPAP    BiPAP  . Spondylosis, cervical   . Type 2 diabetes mellitus ([place])   . Wears glasses     Past Surgical History:  Procedure Laterality Date  . ANTERIOR REMOVAL CAGE AND PLATE C3-C7/ CORPECTOMY C7 (FX)/  ALLOGRAFT AND FUSION C3 -- T1/  POSTERIOR DECOMPRESSION BILATERAL LAMINECTOMY C4 -- 6 & PARTIAL C3/  POSTEROLATERAL ARTHRODESIS C3 - T1  06/05/2005  . APPENDECTOMY  1990'S  . CATARACT EXTRACTION W/ INTRAOCULAR LENS  IMPLANT, BILATERAL  2013  . CERVICAL FUSION  05/11/2005   C3  --  C7/  due to post op quadriparesis same day s/p  anterior C 4,5,6, colpectomy, decompression, removal epidural hematoma, foraminnotomy, cage and plate  . COLONOSCOPY  last one 04-07-2014  . CYSTOSCOPY W/ RETROGRADES Bilateral 08/19/2013   Procedure: CYSTOSCOPY WITH RETROGRADE PYELOGRAM;  Surgeon: Molli Hazard, MD;  Location: [facility];  Service: Urology;  Laterality: Bilateral;  . CYSTOSCOPY W/ RETROGRADES Bilateral 07/18/2016   Procedure: CYSTOSCOPY WITH RETROGRADE PYELOGRAM;  Surgeon: Alexis Frock, MD;  Location: [facility];  Service: Urology;  Laterality: Bilateral;  .  ENDOSCOPIC REPAIR CSF LEAK VIA NASAL PASSAGE  2011  . INTRAOPERATIVE ARTERIOGRAM  CATH LAB  01-05-2009  DR Einar Gip   RICA   ACUTE ANGLE 80-85% STENOSIS  . KNEE ARTHROSCOPY Left 07/15/2017   Procedure: ARTHROSCOPY LEFT  KNEE AND DEBRIDEMENT, medial meniscal tear and chondromalaita;  Surgeon: Paralee Cancel, MD;  Location: [facility];  Service: Orthopedics;  Laterality: Left;  60 MINS  . LUMBAR FUSION  03-26-2012   L3 --  L5  . RADIOACTIVE PROSTATE SEED IMPLANTS  08-19-2003  . SEPTOPLASTY  1998  . TOTAL KNEE ARTHROPLASTY Left 02/25/2018   Procedure: LEFT TOTAL KNEE ARTHROPLASTY;  Surgeon: Paralee Cancel, MD;  Location: [facility];  Service: Orthopedics;  Laterality: Left;  70 mins   . TRANSURETHRAL RESECTION OF BLADDER TUMOR N/A 08/19/2013   Procedure: TRANSURETHRAL RESECTION OF BLADDER TUMOR (TURBT);  Surgeon: Molli Hazard, MD;  Location: [facility];  Service: Urology;  Laterality: N/A;  . TRANSURETHRAL RESECTION OF BLADDER TUMOR N/A 07/18/2016   Procedure: TRANSURETHRAL RESECTION OF BLADDER TUMOR (TURBT);  Surgeon: Alexis Frock, MD;  Location: [facility];  Service: Urology;  Laterality: N/A;  . YAG LASER APPLICATION  08/67/6195   Procedure: YAG LASER APPLICATION;  Surgeon: Elta Guadeloupe T. Gershon Crane, MD;  Location: [facility];  Service: Ophthalmology;  Laterality: Right;    There were no vitals filed for this visit.  Subjective Assessment - 03/24/18 1259    Subjective  Pt went to [place] this weekend, stated he did a lot of standing in the casinos.  Reports he finished muscle relaxor and pain medication and knee is feeling good today.      Patient Stated Goals  get better in a hurry    Currently in Pain?  No/denies         [facility] PT Assessment - 03/24/18 0001      Assessment   Medical Diagnosis  Lt TKA    Referring Provider  Paralee Cancel, MD    Onset Date/Surgical Date  02/25/18    Next MD Visit  unsure beginning of August    Prior Therapy  none for current TKA, has had PT in past for neck and back surgeries                   [facility] Adult PT Treatment/Exercise - 03/24/18 0001      Knee/Hip Exercises: Stretches   Active Hamstring Stretch  Left;3 reps;30 seconds;Limitations    Active Hamstring Stretch Limitations  standing 12" step with light pressure    Knee: Self-Stretch to increase Flexion  Left    Knee: Self-Stretch Limitations  10x10" on 12" step      Knee/Hip Exercises: Standing   Heel Raises  Both;20 reps    Heel Raises Limitations  heel and toe on incline slope    Terminal Knee Extension  15 reps    Terminal Knee Extension Limitations  RTB    Rocker Board  2 minutes;Limitations    Rocker Board  Limitations  R/L and Df/Pf      Knee/Hip Exercises: Seated   Sit to Sand  10 reps;without UE support eccentric control      Knee/Hip Exercises: Supine   Quad Sets  Left;15 reps    [employer] Limitations  5"    Short Arc Quad Sets  Left;15 reps    Short Arc Quad Sets Limitations  5" holds    Heel Slides  15 reps    Heel Slides Limitations  2-3"  holds    Knee Extension  AROM    Knee Extension Limitations  4    Knee Flexion  AROM    Knee Flexion Limitations  115      Knee/Hip Exercises: Prone   Prone Knee Hang  2 minutes;Limitations    Prone Knee Hang Limitations  with manual to hamstrings    Other Prone Exercises  TKE 10x5"      Manual Therapy   Manual Therapy  Joint mobilization    Manual therapy comments  completed separate rest of treatment    Joint Mobilization  patella mobs all directions               PT Short Term Goals - 03/10/18 1344      PT SHORT TERM GOAL #1   Title  Pt will be independent with HEP and perform consistently in order to improve ROM and decrease pain.    Time  3    Period  Weeks    Status  On-going      PT SHORT TERM GOAL #2   Title  Pt will have decreased joint line edema by 2cm or > in order to maximize ROM and decrease pain.    Time  3    Period  Weeks    Status  On-going      PT SHORT TERM GOAL #3   Title  Pt will have improved L knee AROM from 10-95 deg in order to maximize gait and decrease pain.    Time  3    Period  Weeks    Status  On-going      PT SHORT TERM GOAL #4   Title  Pt will have 1/2 grade throughout BLE MMT in order to maximize functional mobility and decrease pain.     Time  3    Period  Weeks    Status  On-going        PT Long Term Goals - 03/10/18 1344      PT LONG TERM GOAL #1   Title  Pt will have improved L knee AROM from 5-115deg in order to further maximize gait, stair ambulation, and allow him to return to gardening with greater ease.    Time  6    Period  Weeks    Status  On-going      PT LONG  TERM GOAL #2   Title  Pt will have 1 grade improvement throughout BLE MMT in order to maximize functional mobility, gait on uneven ground, and allow him to perform household chores with greater ease.    Time  6    Period  Weeks    Status  On-going      PT LONG TERM GOAL #3   Title  Pt will be able to perform bil SLS for 20 sec or > in order to maximize stair ambulation and overall gait mechanics.    Time  6    Period  Weeks    Status  On-going      PT LONG TERM GOAL #4   Title  Pt will have 179ft improvement or > during 3MWT wtih LRAD in order to demo improved functional strength and maximize his ability to return to yardwork.    Time  6    Period  Weeks    Status  On-going      PT LONG TERM GOAL #5   Title  Pt will be able to perform 5xSTS  in 12 sec or <  without UE and proper mechanics in order to demo improved functional strength and maximize pt's transfers.     Time  6    Period  Weeks    Status  On-going            Plan - 03/24/18 1437    Clinical Impression Statement  Pt progressing well towards goals.  Session focus with knee mobility.  Continued prone knee hang with manual to hamstrings to address soft tissue restrictions.  Also added prone TKE to improve extension.  Pt presents with less edema proximal knee, joint mobs complete to improve mobility.  AROM 4-115 degrees (was 6-111 degrees last session.)  No reports of increased pain through session.    Rehab Potential  Good    PT Frequency  3x / week    PT Duration  6 weeks    PT Treatment/Interventions  ADLs/Self Care Home Management;Biofeedback;Cryotherapy;Electrical Stimulation;Ultrasound;DME Instruction;Gait training;Stair training;Functional mobility training;Therapeutic activities;Therapeutic exercise;Balance training;Neuromuscular re-education;Patient/family education;Orthotic Fit/Training;Manual techniques;Manual lymph drainage;Passive range of motion;Dry needling;Energy conservation;Taping    PT Next Visit Plan   focus will continue to be on ROM and edema until pt ROM is functional; continue patellar mobility; Continue prone knee hang next session with manual.      PT Home Exercise Plan  eval: quad sets, heel slides       Patient will benefit from skilled therapeutic intervention in order to improve the following deficits and impairments:  Abnormal gait, Decreased activity tolerance, Decreased balance, Decreased endurance, Decreased mobility, Decreased range of motion, Decreased scar mobility, Decreased strength, Difficulty walking, Hypomobility, Increased edema, Increased fascial restricitons, Increased muscle spasms, Impaired flexibility, Pain  Visit Diagnosis: Stiffness of left knee, not elsewhere classified  Localized edema  Muscle weakness (generalized)  Difficulty in walking, not elsewhere classified     Problem List Patient Active Problem List   Diagnosis Date Noted  . Obese 02/28/2018  . S/P left TKA 02/25/2018  . S/P total knee replacement 02/25/2018  . Fever 10/09/2016  . Community acquired pneumonia of left lower lobe of lung ([place]) 10/09/2016  . Gram negative sepsis ([place]) 02/05/2014  . UTI (urinary tract infection) 02/04/2014  . Sepsis ([place]) 02/04/2014  . DM type 2 (diabetes mellitus, type 2) ([place]) 02/04/2014  . Tobacco abuse 02/04/2014  . Preop cardiovascular exam 08/05/2013  . CAD (coronary artery disease) 08/05/2012  . Degenerative lumbar spinal stenosis 04/03/2012  . Chest pain 04/03/2012  . Anemia 04/03/2012  . Smoking 06/13/2011  . JOINT EFFUSION, KNEE 01/11/2010  . ARTHRITIS, RIGHT KNEE 12/12/2009  . PAIN IN JOINT, LOWER LEG 12/12/2009  . PROSTATE CANCER 11/08/2009  . GOUTY ARTHRITIS, CHRONIC 11/08/2009  . Overweight 11/08/2009  . HYPERTENSION, BENIGN 11/08/2009  . Carotid artery disease without cerebral infarction ([place]) 11/08/2009  . Malignant neoplasm of bladder ([place]) 03/22/2008  . Hyperlipidemia 03/22/2008  . PAD (peripheral artery disease) ([place]) 03/22/2008   . CONSTIPATION, CHRONIC 03/22/2008  . DEGENERATIVE JOINT DISEASE 03/22/2008  . OSA (obstructive sleep apnea) 03/22/2008  . PROSTATE CANCER, HX OF 03/22/2008  . DIVERTICULITIS, HX OF 03/22/2008   Ihor Austin, [place]; [place]  Aldona Lento 03/24/2018, 3:11 PM  [place], [place], [address] Phone: [phone]   Fax:  [phone]  Name: GASPAR FOWLE MRN: [id number] Date of Birth: 1946-01-06

## 2018-03-26 ENCOUNTER — Ambulatory Visit (HOSPITAL_COMMUNITY): Payer: Medicare HMO

## 2018-03-26 ENCOUNTER — Encounter (HOSPITAL_COMMUNITY): Payer: Self-pay

## 2018-03-26 DIAGNOSIS — M6281 Muscle weakness (generalized): Secondary | ICD-10-CM | POA: Diagnosis not present

## 2018-03-26 DIAGNOSIS — R600 Localized edema: Secondary | ICD-10-CM | POA: Diagnosis not present

## 2018-03-26 DIAGNOSIS — M25662 Stiffness of left knee, not elsewhere classified: Secondary | ICD-10-CM | POA: Diagnosis not present

## 2018-03-26 DIAGNOSIS — R262 Difficulty in walking, not elsewhere classified: Secondary | ICD-10-CM

## 2018-03-26 NOTE — Patient Instructions (Addendum)
SINGLE LIMB STANCE    Stance: single leg on floor. Raise leg. Hold 30 seconds. Repeat with other leg. 3 reps per set, 1-2 sets per day, ___ days per week  Copyright  VHI. All rights reserved.   Tandem Stance    Right foot in front of left, heel touching toe both feet "straight ahead". Stand on Foot Triangle of Support with both feet. Balance in this position 30 seconds. Do with left foot in front of right.  Copyright  VHI. All rights reserved.

## 2018-03-26 NOTE — Therapy (Addendum)
[facility] [place], [place], [address] Phone: [phone]   Fax:  [phone]   Progress Note Reporting Period 03/04/18 to 03/26/18  See note below for Objective Data and Assessment of Progress/Goals.      I have personally reviewed this note and agree with its findings.  Geraldine Solar PT, DPT   Physical Therapy Treatment  Patient Details  Name: [name] MRN: [id number] Date of Birth: Sep 04, 1946 Referring Provider: Paralee Cancel, MD   Encounter Date: 03/26/2018  PT End of Session - 03/26/18 1400    Visit Number  10    Number of Visits  19    Date for PT Re-Evaluation  04/15/18 MIn[place]asses complete 03/26/18    Authorization Type  [employer]    Authorization Time Period  03/04/18 to 04/15/18    Authorization - Visit Number  10    Authorization - Number of Visits  19    PT Start Time  1301    PT Stop Time  6568    PT Time Calculation (min)  51 min    Activity Tolerance  Patient tolerated treatment well;No increased pain    Behavior During Therapy  WFL for tasks assessed/performed       Past Medical History:  Diagnosis Date  . Bilateral renal cysts   . Bladder cancer ([place]) UROLOGIST-  DR [facility]   RECURRENT 11/ 2017 --  hx turbt in 2004 and 08-19-2013  . BPH without obstruction/lower urinary tract symptoms   . Cervical fusion syndrome    LEFT ARM NUMBNESS--  CONTROLLED W/ GABAPENTIN  . Coronary artery disease    CARDIOLOGIST-- DR BRANCH [name])  . DDD (degenerative disc disease), cervical   . DDD (degenerative disc disease), lumbar   . Diverticulosis of sigmoid colon   . History of acute gouty arthritis   . History of adenomatous polyp of colon    2001;  2012- non-malignant leiomyoma;  04-07-2014  tubualr adenoma and hyperplastic polyp's  . History of prostate cancer UROLOGIST-  DR [facility]   S/P  RADIOACTIVE SEED IMPLANTS 2004  . HOH (hard of hearing)    no hearing aids  . Hyperlipidemia    . Hypertension   . OA (osteoarthritis)   . Occlusion and stenosis of carotid artery without mention of cerebral infarction CARDIOLOGIST  -- DR  [name]   LAST DUPLEX  11-23-2015  RICA  (DUPLEX DOPPLER 01-20-2016 RICA 12-75% PROXIMAL) /   LICA <17% (HAD CONSULT W/ [name] , VASCULAR SURGEON 01-20-2016)  . OSA on CPAP    BiPAP  . Spondylosis, cervical   . Type 2 diabetes mellitus ([place])   . Wears glasses     Past Surgical History:  Procedure Laterality Date  . ANTERIOR REMOVAL CAGE AND PLATE C3-C7/ CORPECTOMY C7 (FX)/  ALLOGRAFT AND FUSION C3 -- T1/  POSTERIOR DECOMPRESSION BILATERAL LAMINECTOMY C4 -- 6 & PARTIAL C3/  POSTEROLATERAL ARTHRODESIS C3 - T1  06/05/2005  . APPENDECTOMY  1990'S  . CATARACT EXTRACTION W/ INTRAOCULAR LENS  IMPLANT, BILATERAL  2013  . CERVICAL FUSION  05/11/2005   C3  --  C7/  due to post op quadriparesis same day s/p  anterior C 4,5,6, colpectomy, decompression, removal epidural hematoma, foraminnotomy, cage and plate  . COLONOSCOPY  last one 04-07-2014  . CYSTOSCOPY W/ RETROGRADES Bilateral 08/19/2013   Procedure: CYSTOSCOPY WITH RETROGRADE PYELOGRAM;  Surgeon: Molli Hazard, MD;  Location: [facility];  Service: Urology;  Laterality: Bilateral;  . CYSTOSCOPY W/ RETROGRADES Bilateral 07/18/2016   Procedure: CYSTOSCOPY WITH RETROGRADE PYELOGRAM;  Surgeon: Alexis Frock, MD;  [facility];  Service: Urology;  Laterality: Bilateral;  . ENDOSCOPIC REPAIR CSF LEAK VIA NASAL PASSAGE  2011  . INTRAOPERATIVE ARTERIOGRAM  CATH LAB  01-05-2009  DR Einar Gip   RICA   ACUTE ANGLE 80-85% STENOSIS  . KNEE ARTHROSCOPY Left 07/15/2017   Procedure: ARTHROSCOPY LEFT  KNEE AND DEBRIDEMENT, medial meniscal tear and chondromalaita;  Surgeon: Paralee Cancel, MD;  Location: [facility];  Service: Orthopedics;  Laterality: Left;  60 MINS  . LUMBAR FUSION  03-26-2012   L3 --  L5  . RADIOACTIVE PROSTATE SEED IMPLANTS  08-19-2003  . SEPTOPLASTY   1998  . TOTAL KNEE ARTHROPLASTY Left 02/25/2018   Procedure: LEFT TOTAL KNEE ARTHROPLASTY;  Surgeon: Paralee Cancel, MD;  Location: [facility];  Service: Orthopedics;  Laterality: Left;  70 mins  . TRANSURETHRAL RESECTION OF BLADDER TUMOR N/A 08/19/2013   Procedure: TRANSURETHRAL RESECTION OF BLADDER TUMOR (TURBT);  Surgeon: Molli Hazard, MD;  Location: [facility];  Service: Urology;  Laterality: N/A;  . TRANSURETHRAL RESECTION OF BLADDER TUMOR N/A 07/18/2016   Procedure: TRANSURETHRAL RESECTION OF BLADDER TUMOR (TURBT);  Surgeon: Alexis Frock, MD;  [facility];  Service: Urology;  Laterality: N/A;  . YAG LASER APPLICATION  83/66/2947   Procedure: YAG LASER APPLICATION;  Surgeon: Elta Guadeloupe T. Gershon Crane, MD;  Location: [facility];  Service: Ophthalmology;  Laterality: Right;    There were no vitals filed for this visit.  Subjective Assessment - 03/26/18 1256    Subjective  Pt stated he feels great today, no reports of pain.  Reoprts he finished mowing yard this morning.      How long can you sit comfortably?  an hour or 2 (was 48mn in recliner; 15 mins with legs in dependent position)    How long can you stand comfortably?  able to cook and wash dishes this morning, probably an hour (was hasn't tested it, probably 15 mins)    How long can you walk comfortably?  [place], probably able to walk 35-45 minutes (was only been walking in the house)    Patient Stated Goals  get better in a hurry    Currently in Pain?  No/denies         [facility] - 03/26/18 0001      Assessment   Medical Diagnosis  Lt TKA    Referring Provider  MParalee Cancel MD    Onset Date/Surgical Date  02/25/18    Next MD Visit  unsure beginning of August    Prior Therapy  none for current TKA, has had PT in past for neck and back surgeries      Observation/Other Assessments   Focus on Therapeutic Outcomes (FOTO)   47%  limitation was 67% limitation      Observation/Other Assessments-Edema    Edema  Circumferential      Circumferential Edema   Circumferential - Right  39cm joint line    Circumferential - Left   41.5 joint line was 43.5 joint line      AROM   AROM Assessment Site  Knee    Right/Left Knee  Left    Left Knee Extension  4 was 20    Left Knee Flexion  117 was 78      Strength   Strength Assessment  Site  Hip;Knee;Ankle    Right Hip Flexion  5/5    Right Hip Extension  4-/5 was 3+/5    Right Hip ABduction  4+/5 was 4+/5    Left Hip Flexion  5/5 was 4+/5    Left Hip Extension  4-/5 was 4-/5    Left Hip ABduction  4+/5 was 4/5    Right Knee Flexion  5/5    Right Knee Extension  5/5    Left Knee Flexion  4/5 was 4/5    Left Knee Extension  4+/5 was 3/5    Right Ankle Dorsiflexion  5/5    Left Ankle Dorsiflexion  5/5      Static Standing Balance   Static Standing - Balance Support  No upper extremity supported    Static Standing Balance -  Activities   Single Leg Stance - Right Leg;Single Leg Stance - Left Leg    Static Standing - Comment/# of Minutes  03/26/18: Rt 2", 5", 3"; Lt 7", 4" 3"      Standardized Balance Assessment   Standardized Balance Assessment  Five Times Sit to Stand    Five times sit to stand comments   11.23" no UE A was 24.5 sec, chair, no UE, LLE extended                   [facility] Adult PT Treatment/Exercise - 03/26/18 0001      Knee/Hip Exercises: Stretches   Active Hamstring Stretch  Left;3 reps;30 seconds;Limitations    Active Hamstring Stretch Limitations  standing 12" step with light pressure    Knee: Self-Stretch to increase Flexion  Left    Knee: Self-Stretch Limitations  10x10" on 12" step    Gastroc Stretch  Both;3 reps;30 seconds    Gastroc Stretch Limitations  slant board      Knee/Hip Exercises: Standing   Heel Raises  Both;2 sets;10 reps    Heel Raises Limitations  squat then heel raise    Terminal Knee Extension  15 reps     Terminal Knee Extension Limitations  GTB    Stairs  assessed stair mechanics 3RT initially meet to then reciprocal pattern with 1 HR    Gait Training  3MWT 573f no AD      Knee/Hip Exercises: Supine   Heel Slides  10 reps    Heel Slides Limitations  5"holds    Knee Extension  AROM    Knee Extension Limitations  4    Knee Flexion  AROM    Knee Flexion Limitations  117      Manual Therapy   Manual Therapy  Edema management;Joint mobilization    Manual therapy comments  completed separate rest of treatment    Edema Management  Retrograde massage to patient's left lower extremity with bilateral lower extremities elevated. Patient supine, due to noted edema in left lower extremity.     Joint Mobilization  patella mobs all directions               PT Short Term Goals - 03/26/18 1305      PT SHORT TERM GOAL #1   Title  Pt will be independent with HEP and perform consistently in order to improve ROM and decrease pain.    Baseline  7/10: Reports compliance iwht HEP daily    Status  Achieved      PT SHORT TERM GOAL #2   Title  Pt will have decreased joint line edema by 2cm or > in order  to maximize ROM and decrease pain.    Baseline  03/26/2108: 41.5 cm was 43.5cm    Status  Achieved      PT SHORT TERM GOAL #3   Title  Pt will have improved L knee AROM from 10-95 deg in order to maximize gait and decrease pain.    Baseline  03/26/18: 4-117 degrees    Status  Achieved      PT SHORT TERM GOAL #4   Title  Pt will have 1/2 grade throughout BLE MMT in order to maximize functional mobility and decrease pain.     Baseline  03/26/18: see MMT    Status  Partially Met        PT Long Term Goals - 03/26/18 1350      PT LONG TERM GOAL #1   Title  Pt will have improved L knee AROM from 5-115deg in order to further maximize gait, stair ambulation, and allow him to return to gardening with greater ease.    Baseline  03/26/18: 4-117 degrees    Status  Achieved      PT LONG TERM GOAL #2    Title  Pt will have 1 grade improvement throughout BLE MMT in order to maximize functional mobility, gait on uneven ground, and allow him to perform household chores with greater ease.    Baseline  03/26/18    Status  On-going      PT LONG TERM GOAL #3   Title  Pt will be able to perform bil SLS for 20 sec or > in order to maximize stair ambulation and overall gait mechanics.    Baseline  03/26/18: Rt 2", 5", 3"; Lt 7", 4" 3"      PT LONG TERM GOAL #4   Title  Pt will have 156f improvement or > during 3MWT wtih LRAD in order to demo improved functional strength and maximize his ability to return to yardwork.    Baseline  03/26/18: 3MWT 5950fno AD      PT LONG TERM GOAL #5   Title  Pt will be able to perform 5xSTS  in 12 sec or < without UE and proper mechanics in order to demo improved functional strength and maximize pt's transfers.     Status  Achieved            Plan - 03/26/18 1401    Clinical Impression Statement  Reviewed goals with vast improvements noted.  Pt reports compliance with HEP daily, reports improvements with toleranced for sitting, standing and walking.  [facility] and long periods of time and no AD indoors.  Knee mobilty improved wiht AROM 4-117 degrees.  Reduced edema to 41.5cm was 43.5cm, pt has had smaller measurements during previous sessions but reports mowing yard and increased walking earlier today.  Pt does continue to have deficits wiith gluteal strengthening and balance.  Added SLS and tandem stance to HEP, encouraged to complete by sink or counter in home to reduce risk of falls.  No reports of pain through session.      Rehab Potential  Good    PT Frequency  3x / week    PT Duration  6 weeks    PT Treatment/Interventions  ADLs/Self Care Home Management;Biofeedback;Cryotherapy;Electrical Stimulation;Ultrasound;DME Instruction;Gait training;Stair training;Functional mobility training;Therapeutic activities;Therapeutic exercise;Balance  training;Neuromuscular re-education;Patient/family education;Orthotic Fit/Training;Manual techniques;Manual lymph drainage;Passive range of motion;Dry needling;Energy conservation;Taping    PT Next Visit Plan  Continue knee mobility for knee extension.  Add functional strengthening and  balance training.  Next session begin sidestep with RTB, lateral and forward step ups, STS and tandem stance    PT Home Exercise Plan  eval: quad sets, heel slides; 7/10: SLS and tandem stance       Patient will benefit from skilled therapeutic intervention in order to improve the following deficits and impairments:  Abnormal gait, Decreased activity tolerance, Decreased balance, Decreased endurance, Decreased mobility, Decreased range of motion, Decreased scar mobility, Decreased strength, Difficulty walking, Hypomobility, Increased edema, Increased fascial restricitons, Increased muscle spasms, Impaired flexibility, Pain  Visit Diagnosis: Stiffness of left knee, not elsewhere classified  Localized edema  Muscle weakness (generalized)  Difficulty in walking, not elsewhere classified     Problem List Patient Active Problem List   Diagnosis Date Noted  . Obese 02/28/2018  . S/P left TKA 02/25/2018  . S/P total knee replacement 02/25/2018  . Fever 10/09/2016  . Community acquired pneumonia of left lower lobe of lung ([place]) 10/09/2016  . Gram negative sepsis ([place]) 02/05/2014  . UTI (urinary tract infection) 02/04/2014  . Sepsis ([place]) 02/04/2014  . DM type 2 (diabetes mellitus, type 2) ([place]) 02/04/2014  . Tobacco abuse 02/04/2014  . Preop cardiovascular exam 08/05/2013  . CAD (coronary artery disease) 08/05/2012  . Degenerative lumbar spinal stenosis 04/03/2012  . Chest pain 04/03/2012  . Anemia 04/03/2012  . Smoking 06/13/2011  . JOINT EFFUSION, KNEE 01/11/2010  . ARTHRITIS, RIGHT KNEE 12/12/2009  . PAIN IN JOINT, LOWER LEG 12/12/2009  . PROSTATE CANCER 11/08/2009  . GOUTY ARTHRITIS, CHRONIC  11/08/2009  . Overweight 11/08/2009  . HYPERTENSION, BENIGN 11/08/2009  . Carotid artery disease without cerebral infarction ([place]) 11/08/2009  . Malignant neoplasm of bladder ([place]) 03/22/2008  . Hyperlipidemia 03/22/2008  . PAD (peripheral artery disease) ([place]) 03/22/2008  . CONSTIPATION, CHRONIC 03/22/2008  . DEGENERATIVE JOINT DISEASE 03/22/2008  . OSA (obstructive sleep apnea) 03/22/2008  . PROSTATE CANCER, HX OF 03/22/2008  . DIVERTICULITIS, HX OF 03/22/2008   Ihor Austin, LPTA; [place]  Aldona Lento 03/26/2018, 2:15 PM  [place], [place], [address] Phone: [phone]   Fax:  [phone]  Name: [name] MRN: [id number] Date of Birth: 1946-01-21

## 2018-03-30 DIAGNOSIS — Z0000 Encounter for general adult medical examination without abnormal findings: Secondary | ICD-10-CM | POA: Diagnosis not present

## 2018-03-30 DIAGNOSIS — E1140 Type 2 diabetes mellitus with diabetic neuropathy, unspecified: Secondary | ICD-10-CM | POA: Diagnosis not present

## 2018-03-30 DIAGNOSIS — E782 Mixed hyperlipidemia: Secondary | ICD-10-CM | POA: Diagnosis not present

## 2018-03-30 DIAGNOSIS — I10 Essential (primary) hypertension: Secondary | ICD-10-CM | POA: Diagnosis not present

## 2018-04-01 ENCOUNTER — Ambulatory Visit (HOSPITAL_COMMUNITY): Payer: Medicare HMO | Admitting: Physical Therapy

## 2018-04-01 DIAGNOSIS — M25662 Stiffness of left knee, not elsewhere classified: Secondary | ICD-10-CM

## 2018-04-01 DIAGNOSIS — R600 Localized edema: Secondary | ICD-10-CM | POA: Diagnosis not present

## 2018-04-01 DIAGNOSIS — M6281 Muscle weakness (generalized): Secondary | ICD-10-CM

## 2018-04-01 DIAGNOSIS — R262 Difficulty in walking, not elsewhere classified: Secondary | ICD-10-CM | POA: Diagnosis not present

## 2018-04-01 NOTE — Therapy (Signed)
[place], [place], [address] Phone: [phone]   Fax:  [phone]  Physical Therapy Treatment/Discharge  Patient Details  Name: [name] MRN: [id number] Date of Birth: Jul 14, 1946 Referring Provider: Paralee Cancel  PHYSICAL THERAPY DISCHARGE SUMMARY  Visits from Start of Care: 11  Current functional level related to goals / functional outcomes: PT has progressed from walking with a rolling walker to walking with no assistive device.  His ROM has improved considerably But he still has decreased extension.   Remaining deficits: Extension and strength.   Education / Equipment: HEP Plan: Patient agrees to discharge.  Patient goals were not met. Patient is being discharged due to meeting the stated rehab goals.  ?????     Encounter Date: 04/01/2018  PT End of Session - 04/01/18 1339    Visit Number  11    Number of Visits  11    Date for PT Re-Evaluation  04/15/18 MInireasses complete 03/26/18    Authorization Type  [employer]    Authorization Time Period  03/04/18 to 04/15/18    Authorization - Visit Number  11    Authorization - Number of Visits  11    PT Start Time  1300    PT Stop Time  1335    PT Time Calculation (min)  35 min    Activity Tolerance  Patient tolerated treatment well;No increased pain    Behavior During Therapy  WFL for tasks assessed/performed       Past Medical History:  Diagnosis Date  . Bilateral renal cysts   . Bladder cancer ([place]) UROLOGIST-  DR [facility]   RECURRENT 11/ 2017 --  hx turbt in 2004 and 08-19-2013  . BPH without obstruction/lower urinary tract symptoms   . Cervical fusion syndrome    LEFT ARM NUMBNESS--  CONTROLLED W/ GABAPENTIN  . Coronary artery disease    CARDIOLOGIST-- DR BRANCH [name])  . DDD (degenerative disc disease), cervical   . DDD (degenerative disc disease), lumbar   . Diverticulosis of sigmoid colon   . History of acute gouty  arthritis   . History of adenomatous polyp of colon    2001;  2012- non-malignant leiomyoma;  04-07-2014  tubualr adenoma and hyperplastic polyp's  . History of prostate cancer UROLOGIST-  DR [facility]   S/P  RADIOACTIVE SEED IMPLANTS 2004  . HOH (hard of hearing)    no hearing aids  . Hyperlipidemia   . Hypertension   . OA (osteoarthritis)   . Occlusion and stenosis of carotid artery without mention of cerebral infarction CARDIOLOGIST  -- DR  [name]   LAST DUPLEX  11-23-2015  RICA  (DUPLEX DOPPLER 01-20-2016 RICA 60-73% PROXIMAL) /   LICA <71% (HAD CONSULT W/ [name] , VASCULAR SURGEON 01-20-2016)  . OSA on CPAP    BiPAP  . Spondylosis, cervical   . Type 2 diabetes mellitus ([place])   . Wears glasses     Past Surgical History:  Procedure Laterality Date  . ANTERIOR REMOVAL CAGE AND PLATE C3-C7/ CORPECTOMY C7 (FX)/  ALLOGRAFT AND FUSION C3 -- T1/  POSTERIOR DECOMPRESSION BILATERAL LAMINECTOMY C4 -- 6 & PARTIAL C3/  POSTEROLATERAL ARTHRODESIS C3 - T1  06/05/2005  . APPENDECTOMY  1990'S  . CATARACT EXTRACTION W/ INTRAOCULAR LENS  IMPLANT, BILATERAL  2013  . CERVICAL FUSION  05/11/2005   C3  --  C7/  due to post op quadriparesis same day s/p  anterior C 4,5,6, colpectomy,  decompression, removal epidural hematoma, foraminnotomy, cage and plate  . COLONOSCOPY  last one 04-07-2014  . CYSTOSCOPY W/ RETROGRADES Bilateral 08/19/2013   Procedure: CYSTOSCOPY WITH RETROGRADE PYELOGRAM;  Surgeon: Molli Hazard, MD;  Location: [facility];  Service: Urology;  Laterality: Bilateral;  . CYSTOSCOPY W/ RETROGRADES Bilateral 07/18/2016   Procedure: CYSTOSCOPY WITH RETROGRADE PYELOGRAM;  Surgeon: Alexis Frock, MD;  Location: [facility];  Service: Urology;  Laterality: Bilateral;  . ENDOSCOPIC REPAIR CSF LEAK VIA NASAL PASSAGE  2011  . INTRAOPERATIVE ARTERIOGRAM  CATH LAB  01-05-2009  DR Einar Gip   RICA   ACUTE ANGLE 80-85% STENOSIS  . KNEE ARTHROSCOPY Left  07/15/2017   Procedure: ARTHROSCOPY LEFT  KNEE AND DEBRIDEMENT, medial meniscal tear and chondromalaita;  Surgeon: Paralee Cancel, MD;  Location: [facility];  Service: Orthopedics;  Laterality: Left;  60 MINS  . LUMBAR FUSION  03-26-2012   L3 --  L5  . RADIOACTIVE PROSTATE SEED IMPLANTS  08-19-2003  . SEPTOPLASTY  1998  . TOTAL KNEE ARTHROPLASTY Left 02/25/2018   Procedure: LEFT TOTAL KNEE ARTHROPLASTY;  Surgeon: Paralee Cancel, MD;  Location: [facility];  Service: Orthopedics;  Laterality: Left;  70 mins  . TRANSURETHRAL RESECTION OF BLADDER TUMOR N/A 08/19/2013   Procedure: TRANSURETHRAL RESECTION OF BLADDER TUMOR (TURBT);  Surgeon: Molli Hazard, MD;  Location: [facility];  Service: Urology;  Laterality: N/A;  . TRANSURETHRAL RESECTION OF BLADDER TUMOR N/A 07/18/2016   Procedure: TRANSURETHRAL RESECTION OF BLADDER TUMOR (TURBT);  Surgeon: Alexis Frock, MD;  Location: [facility];  Service: Urology;  Laterality: N/A;  . YAG LASER APPLICATION  58/52/7782   Procedure: YAG LASER APPLICATION;  Surgeon: Elta Guadeloupe T. Gershon Crane, MD;  Location: [facility];  Service: Ophthalmology;  Laterality: Right;    There were no vitals filed for this visit.  Subjective Assessment - 04/01/18 1303    Subjective  [name] states that he is doing well he still feels he needs to work on balance and walking but he is doing this on his own.   He would like today to be his last day.       How long can you sit comfortably?  an hour or 2 (was 79mn in recliner; 15 mins with legs in dependent position)    How long can you stand comfortably?  able to cook and wash dishes this morning, probably an hour (was hasn't tested it, probably 15 mins)    How long can you walk comfortably?  [place] then [place] house, probably able to walk 35-45 minutes (was only been walking in the house)    Patient Stated Goals  get better in a hurry    Currently in Pain?  Yes    Pain Score   1     Pain Location  Knee    Pain Orientation  Left    Pain Descriptors / Indicators  Sore    Pain Type  Acute pain    Pain Onset  More than a month ago    Pain Frequency  Intermittent    Aggravating Factors   activity          [facility] PT Assessment - 04/01/18 0001      Assessment   Medical Diagnosis  Lt TKA    Referring Provider  MParalee Cancel   Onset Date/Surgical Date  02/25/18    Next MD Visit  unsure beginning of August    Prior  Therapy  none for current TKA, has had PT in past for neck and back surgeries      Observation/Other Assessments   Focus on Therapeutic Outcomes (FOTO)   47% limitation was 67% limitation      Observation/Other Assessments-Edema    Edema  Circumferential      Circumferential Edema   Circumferential - Right  39cm joint line    Circumferential - Left   41.5 joint line was 43.5 joint line      AROM   Left Knee Extension  4 was 20    Left Knee Flexion  117 was 78      Strength   Right Hip Flexion  5/5    Right Hip Extension  4-/5 was 3+/5    Right Hip ABduction  4+/5 was 4+/5    Left Hip Flexion  5/5 was 4+/5    Left Hip Extension  4-/5 was 4-/5    Left Hip ABduction  4+/5 was 4/5    Right Knee Flexion  5/5    Right Knee Extension  5/5    Left Knee Flexion  4/5 was 4/5    Left Knee Extension  4+/5 was 3/5    Right Ankle Dorsiflexion  5/5    Left Ankle Dorsiflexion  5/5      Ambulation/Gait   Ambulation Distance (Feet)  436 Feet was 290 with walker in 3 minutes .    Assistive device  None    Gait Comments  3 '       Static Standing Balance   Static Standing - Balance Support  No upper extremity supported    Static Standing Balance -  Activities   Single Leg Stance - Right Leg;Single Leg Stance - Left Leg      Standardized Balance Assessment   Standardized Balance Assessment  Five Times Sit to Stand    Five times sit to stand comments   11.23" no UE A was 24.5 sec, chair, no UE, LLE extended                   [facility] Adult  PT Treatment/Exercise - 04/01/18 0001      Exercises   Exercises  Knee/Hip      Knee/Hip Exercises: Stretches   Active Hamstring Stretch  Left;3 reps;30 seconds      Knee/Hip Exercises: Standing   SLS  x5 B      Knee/Hip Exercises: Seated   Sit to Sand  15 reps      Knee/Hip Exercises: Supine   Knee Extension  AROM    Knee Extension Limitations  7    Knee Flexion  AROM    Knee Flexion Limitations  120               PT Short Term Goals - 04/01/18 1328      PT SHORT TERM GOAL #1   Title  Pt will be independent with HEP and perform consistently in order to improve ROM and decrease pain.    Baseline  7/10: Reports compliance iwht HEP daily    Status  Achieved      PT SHORT TERM GOAL #2   Title  Pt will have decreased joint line edema by 2cm or > in order to maximize ROM and decrease pain.    Baseline  03/26/2108: 41.5 cm was 43.5cm    Status  Achieved      PT SHORT TERM GOAL #3   Title  Pt will have improved  L knee AROM from 10-95 deg in order to maximize gait and decrease pain.    Baseline  03/26/18: 4-117 degrees    Status  Achieved      PT SHORT TERM GOAL #4   Title  Pt will have 1/2 grade throughout BLE MMT in order to maximize functional mobility and decrease pain.     Baseline  03/26/18: see MMT    Status  Partially Met        PT Long Term Goals - 04/01/18 1328      PT LONG TERM GOAL #1   Title  Pt will have improved L knee AROM from 5-115deg in order to further maximize gait, stair ambulation, and allow him to return to gardening with greater ease.    Baseline  03/26/18: 4-117 degrees; 03/31/2018 7-120 Pt cleaned his car and was so sore that he has stiffened up a bit.     Status  Achieved      PT LONG TERM GOAL #2   Title  Pt will have 1 grade improvement throughout BLE MMT in order to maximize functional mobility, gait on uneven ground, and allow him to perform household chores with greater ease.    Baseline  03/26/18    Status  On-going      PT LONG  TERM GOAL #3   Title  Pt will be able to perform bil SLS for 20 sec or > in order to maximize stair ambulation and overall gait mechanics.    Baseline  7/15; Rt 12 secods; Lt 8    Status  On-going      PT LONG TERM GOAL #4   Title  Pt will have 121f improvement or > during 3MWT wtih LRAD in order to demo improved functional strength and maximize his ability to return to yardwork.    Baseline  03/26/18: 3MWT 5972fno AD; 7/15 now sore from cleaning his car and only able to walk 436 in 3 minutes     Status  Achieved      PT LONG TERM GOAL #5   Title  Pt will be able to perform 5xSTS  in 12 sec or < without UE and proper mechanics in order to demo improved functional strength and maximize pt's transfers.     Status  Achieved            Plan - 04/01/18 1339    Clinical Impression Statement  Pt states that he feels that he can continue his rehabilitation at home.  He desires today to be his last treatment.     Rehab Potential  Good    PT Frequency  3x / week    PT Duration  6 weeks    PT Treatment/Interventions  ADLs/Self Care Home Management;Biofeedback;Cryotherapy;Electrical Stimulation;Ultrasound;DME Instruction;Gait training;Stair training;Functional mobility training;Therapeutic activities;Therapeutic exercise;Balance training;Neuromuscular re-education;Patient/family education;Orthotic Fit/Training;Manual techniques;Manual lymph drainage;Passive range of motion;Dry needling;Energy conservation;Taping    PT Next Visit Plan  Discharge patient.     PT Home Exercise Plan  eval: quad sets, heel slides; 7/10: SLS and tandem stance       Patient will benefit from skilled therapeutic intervention in order to improve the following deficits and impairments:  Abnormal gait, Decreased activity tolerance, Decreased balance, Decreased endurance, Decreased mobility, Decreased range of motion, Decreased scar mobility, Decreased strength, Difficulty walking, Hypomobility, Increased edema, Increased  fascial restricitons, Increased muscle spasms, Impaired flexibility, Pain  Visit Diagnosis: Stiffness of left knee, not elsewhere classified  Localized edema  Muscle weakness (generalized)  Difficulty  in walking, not elsewhere classified     Problem List Patient Active Problem List   Diagnosis Date Noted  . Obese 02/28/2018  . S/P left TKA 02/25/2018  . S/P total knee replacement 02/25/2018  . Fever 10/09/2016  . Community acquired pneumonia of left lower lobe of lung ([place]) 10/09/2016  . Gram negative sepsis ([place]) 02/05/2014  . UTI (urinary tract infection) 02/04/2014  . Sepsis ([place]) 02/04/2014  . DM type 2 (diabetes mellitus, type 2) ([place]) 02/04/2014  . Tobacco abuse 02/04/2014  . Preop cardiovascular exam 08/05/2013  . CAD (coronary artery disease) 08/05/2012  . Degenerative lumbar spinal stenosis 04/03/2012  . Chest pain 04/03/2012  . Anemia 04/03/2012  . Smoking 06/13/2011  . JOINT EFFUSION, KNEE 01/11/2010  . ARTHRITIS, RIGHT KNEE 12/12/2009  . PAIN IN JOINT, LOWER LEG 12/12/2009  . PROSTATE CANCER 11/08/2009  . GOUTY ARTHRITIS, CHRONIC 11/08/2009  . Overweight 11/08/2009  . HYPERTENSION, BENIGN 11/08/2009  . Carotid artery disease without cerebral infarction ([place]) 11/08/2009  . Malignant neoplasm of bladder ([place]) 03/22/2008  . Hyperlipidemia 03/22/2008  . PAD (peripheral artery disease) ([place]) 03/22/2008  . CONSTIPATION, CHRONIC 03/22/2008  . DEGENERATIVE JOINT DISEASE 03/22/2008  . OSA (obstructive sleep apnea) 03/22/2008  . PROSTATE CANCER, HX OF 03/22/2008  . Brett Fairy OF 03/22/2008    [name], PT CLT [phone] 04/01/2018, 1:41 PM  [facility] [address] [place], [place], [address] Phone: [phone]   Fax:  [phone]  Name: [name] MRN: [id number] Date of Birth: 1946/01/04

## 2018-04-04 DIAGNOSIS — Z6830 Body mass index (BMI) 30.0-30.9, adult: Secondary | ICD-10-CM | POA: Diagnosis not present

## 2018-04-04 DIAGNOSIS — R69 Illness, unspecified: Secondary | ICD-10-CM | POA: Diagnosis not present

## 2018-04-04 DIAGNOSIS — I10 Essential (primary) hypertension: Secondary | ICD-10-CM | POA: Diagnosis not present

## 2018-04-04 DIAGNOSIS — M109 Gout, unspecified: Secondary | ICD-10-CM | POA: Diagnosis not present

## 2018-04-04 DIAGNOSIS — E782 Mixed hyperlipidemia: Secondary | ICD-10-CM | POA: Diagnosis not present

## 2018-04-04 DIAGNOSIS — G9009 Other idiopathic peripheral autonomic neuropathy: Secondary | ICD-10-CM | POA: Diagnosis not present

## 2018-04-04 DIAGNOSIS — E1140 Type 2 diabetes mellitus with diabetic neuropathy, unspecified: Secondary | ICD-10-CM | POA: Diagnosis not present

## 2018-04-09 DIAGNOSIS — Z471 Aftercare following joint replacement surgery: Secondary | ICD-10-CM | POA: Diagnosis not present

## 2018-04-09 DIAGNOSIS — Z96652 Presence of left artificial knee joint: Secondary | ICD-10-CM | POA: Diagnosis not present

## 2018-04-16 DIAGNOSIS — X32XXXD Exposure to sunlight, subsequent encounter: Secondary | ICD-10-CM | POA: Diagnosis not present

## 2018-04-16 DIAGNOSIS — C44319 Basal cell carcinoma of skin of other parts of face: Secondary | ICD-10-CM | POA: Diagnosis not present

## 2018-04-16 DIAGNOSIS — L570 Actinic keratosis: Secondary | ICD-10-CM | POA: Diagnosis not present

## 2018-04-17 DIAGNOSIS — C670 Malignant neoplasm of trigone of bladder: Secondary | ICD-10-CM | POA: Diagnosis not present

## 2018-04-17 DIAGNOSIS — C61 Malignant neoplasm of prostate: Secondary | ICD-10-CM | POA: Diagnosis not present

## 2018-04-17 DIAGNOSIS — R3915 Urgency of urination: Secondary | ICD-10-CM | POA: Diagnosis not present

## 2018-04-30 ENCOUNTER — Other Ambulatory Visit: Payer: Self-pay | Admitting: Cardiology

## 2018-05-14 DIAGNOSIS — Z08 Encounter for follow-up examination after completed treatment for malignant neoplasm: Secondary | ICD-10-CM | POA: Diagnosis not present

## 2018-05-14 DIAGNOSIS — Z85828 Personal history of other malignant neoplasm of skin: Secondary | ICD-10-CM | POA: Diagnosis not present

## 2018-05-14 DIAGNOSIS — X32XXXD Exposure to sunlight, subsequent encounter: Secondary | ICD-10-CM | POA: Diagnosis not present

## 2018-05-14 DIAGNOSIS — L570 Actinic keratosis: Secondary | ICD-10-CM | POA: Diagnosis not present

## 2018-05-25 ENCOUNTER — Other Ambulatory Visit: Payer: Self-pay | Admitting: Cardiology

## 2018-06-04 DIAGNOSIS — M1712 Unilateral primary osteoarthritis, left knee: Secondary | ICD-10-CM | POA: Diagnosis not present

## 2018-06-04 DIAGNOSIS — E782 Mixed hyperlipidemia: Secondary | ICD-10-CM | POA: Diagnosis not present

## 2018-06-04 DIAGNOSIS — M1380 Other specified arthritis, unspecified site: Secondary | ICD-10-CM | POA: Diagnosis not present

## 2018-06-04 DIAGNOSIS — I10 Essential (primary) hypertension: Secondary | ICD-10-CM | POA: Diagnosis not present

## 2018-06-04 DIAGNOSIS — M109 Gout, unspecified: Secondary | ICD-10-CM | POA: Diagnosis not present

## 2018-06-04 DIAGNOSIS — M1990 Unspecified osteoarthritis, unspecified site: Secondary | ICD-10-CM | POA: Diagnosis not present

## 2018-06-04 DIAGNOSIS — E1140 Type 2 diabetes mellitus with diabetic neuropathy, unspecified: Secondary | ICD-10-CM | POA: Diagnosis not present

## 2018-06-24 ENCOUNTER — Other Ambulatory Visit: Payer: Self-pay | Admitting: Cardiology

## 2018-07-22 DIAGNOSIS — R3915 Urgency of urination: Secondary | ICD-10-CM | POA: Diagnosis not present

## 2018-07-22 DIAGNOSIS — C61 Malignant neoplasm of prostate: Secondary | ICD-10-CM | POA: Diagnosis not present

## 2018-07-22 DIAGNOSIS — C670 Malignant neoplasm of trigone of bladder: Secondary | ICD-10-CM | POA: Diagnosis not present

## 2018-07-24 ENCOUNTER — Other Ambulatory Visit: Payer: Self-pay | Admitting: Cardiology

## 2018-07-30 DIAGNOSIS — Z08 Encounter for follow-up examination after completed treatment for malignant neoplasm: Secondary | ICD-10-CM | POA: Diagnosis not present

## 2018-07-30 DIAGNOSIS — X32XXXD Exposure to sunlight, subsequent encounter: Secondary | ICD-10-CM | POA: Diagnosis not present

## 2018-07-30 DIAGNOSIS — L570 Actinic keratosis: Secondary | ICD-10-CM | POA: Diagnosis not present

## 2018-07-30 DIAGNOSIS — L820 Inflamed seborrheic keratosis: Secondary | ICD-10-CM | POA: Diagnosis not present

## 2018-07-30 DIAGNOSIS — Z85828 Personal history of other malignant neoplasm of skin: Secondary | ICD-10-CM | POA: Diagnosis not present

## 2018-07-31 DIAGNOSIS — Z23 Encounter for immunization: Secondary | ICD-10-CM | POA: Diagnosis not present

## 2018-08-25 DIAGNOSIS — G4733 Obstructive sleep apnea (adult) (pediatric): Secondary | ICD-10-CM | POA: Diagnosis not present

## 2018-09-05 DIAGNOSIS — J449 Chronic obstructive pulmonary disease, unspecified: Secondary | ICD-10-CM | POA: Diagnosis not present

## 2018-09-05 DIAGNOSIS — Z0000 Encounter for general adult medical examination without abnormal findings: Secondary | ICD-10-CM | POA: Diagnosis not present

## 2018-09-23 DIAGNOSIS — J060 Acute laryngopharyngitis: Secondary | ICD-10-CM | POA: Diagnosis not present

## 2018-09-23 DIAGNOSIS — M79605 Pain in left leg: Secondary | ICD-10-CM | POA: Diagnosis not present

## 2018-09-23 DIAGNOSIS — H1031 Unspecified acute conjunctivitis, right eye: Secondary | ICD-10-CM | POA: Diagnosis not present

## 2018-10-13 DIAGNOSIS — E782 Mixed hyperlipidemia: Secondary | ICD-10-CM | POA: Diagnosis not present

## 2018-10-13 DIAGNOSIS — E1140 Type 2 diabetes mellitus with diabetic neuropathy, unspecified: Secondary | ICD-10-CM | POA: Diagnosis not present

## 2018-10-13 DIAGNOSIS — Z6831 Body mass index (BMI) 31.0-31.9, adult: Secondary | ICD-10-CM | POA: Diagnosis not present

## 2018-10-13 DIAGNOSIS — I10 Essential (primary) hypertension: Secondary | ICD-10-CM | POA: Diagnosis not present

## 2018-10-17 DIAGNOSIS — E1140 Type 2 diabetes mellitus with diabetic neuropathy, unspecified: Secondary | ICD-10-CM | POA: Diagnosis not present

## 2018-10-17 DIAGNOSIS — M109 Gout, unspecified: Secondary | ICD-10-CM | POA: Diagnosis not present

## 2018-10-17 DIAGNOSIS — N400 Enlarged prostate without lower urinary tract symptoms: Secondary | ICD-10-CM | POA: Diagnosis not present

## 2018-10-17 DIAGNOSIS — I10 Essential (primary) hypertension: Secondary | ICD-10-CM | POA: Diagnosis not present

## 2018-10-17 DIAGNOSIS — M545 Low back pain: Secondary | ICD-10-CM | POA: Diagnosis not present

## 2018-10-17 DIAGNOSIS — E782 Mixed hyperlipidemia: Secondary | ICD-10-CM | POA: Diagnosis not present

## 2018-10-28 DIAGNOSIS — Z08 Encounter for follow-up examination after completed treatment for malignant neoplasm: Secondary | ICD-10-CM | POA: Diagnosis not present

## 2018-10-28 DIAGNOSIS — L820 Inflamed seborrheic keratosis: Secondary | ICD-10-CM | POA: Diagnosis not present

## 2018-10-28 DIAGNOSIS — Z85828 Personal history of other malignant neoplasm of skin: Secondary | ICD-10-CM | POA: Diagnosis not present

## 2018-11-04 DIAGNOSIS — M25551 Pain in right hip: Secondary | ICD-10-CM | POA: Diagnosis not present

## 2018-11-11 ENCOUNTER — Other Ambulatory Visit (HOSPITAL_COMMUNITY): Payer: Self-pay | Admitting: Internal Medicine

## 2018-11-11 ENCOUNTER — Other Ambulatory Visit: Payer: Self-pay | Admitting: Internal Medicine

## 2018-11-11 DIAGNOSIS — R109 Unspecified abdominal pain: Secondary | ICD-10-CM

## 2018-11-11 DIAGNOSIS — R34 Anuria and oliguria: Secondary | ICD-10-CM

## 2018-11-20 ENCOUNTER — Ambulatory Visit (HOSPITAL_COMMUNITY): Payer: Medicare HMO

## 2018-11-20 ENCOUNTER — Encounter (HOSPITAL_COMMUNITY): Payer: Self-pay

## 2018-11-20 DIAGNOSIS — N130 Hydronephrosis with ureteropelvic junction obstruction: Secondary | ICD-10-CM | POA: Diagnosis not present

## 2018-11-20 DIAGNOSIS — Z8546 Personal history of malignant neoplasm of prostate: Secondary | ICD-10-CM | POA: Diagnosis not present

## 2019-01-08 DIAGNOSIS — Z0000 Encounter for general adult medical examination without abnormal findings: Secondary | ICD-10-CM | POA: Diagnosis not present

## 2019-01-19 ENCOUNTER — Other Ambulatory Visit: Payer: Self-pay | Admitting: Cardiology

## 2019-01-19 DIAGNOSIS — R3915 Urgency of urination: Secondary | ICD-10-CM | POA: Diagnosis not present

## 2019-01-19 DIAGNOSIS — C61 Malignant neoplasm of prostate: Secondary | ICD-10-CM | POA: Diagnosis not present

## 2019-01-19 DIAGNOSIS — R1084 Generalized abdominal pain: Secondary | ICD-10-CM | POA: Diagnosis not present

## 2019-01-19 DIAGNOSIS — C670 Malignant neoplasm of trigone of bladder: Secondary | ICD-10-CM | POA: Diagnosis not present

## 2019-03-08 IMAGING — US US CAROTID DUPLEX BILAT
1 series · 13 of 24 positions shown · non-contrast
Comparison: 11/23/2015

CLINICAL DATA: Follow-up carotid stenosis

EXAM:
BILATERAL CAROTID DUPLEX ULTRASOUND
TECHNIQUE: Gray scale imaging, color Doppler and duplex ultrasound were
performed of bilateral carotid and vertebral arteries in the neck.

[Series 1: us carotid duplex bilat · 0.06mm/px · 13 of 68 slices shown]
[im 1/68]
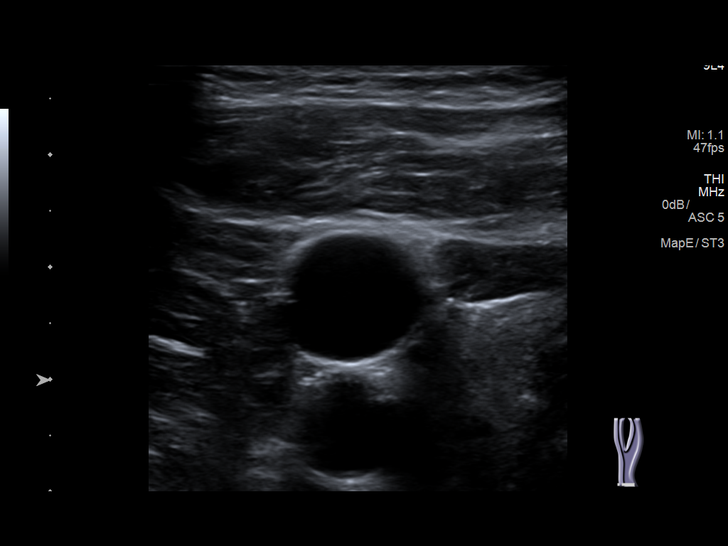
[im 6/68]
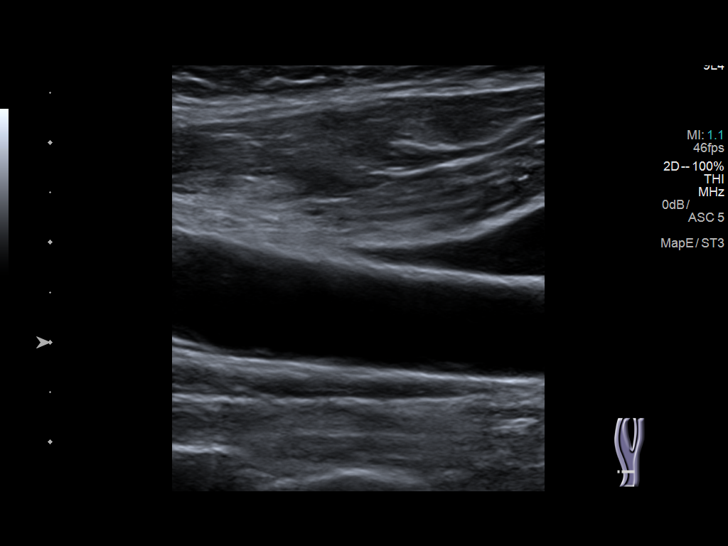
[im 12/68]
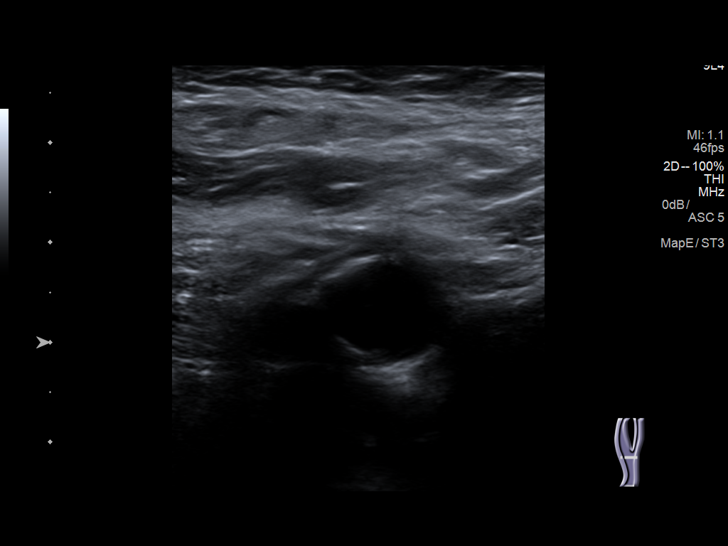
[im 18/68]
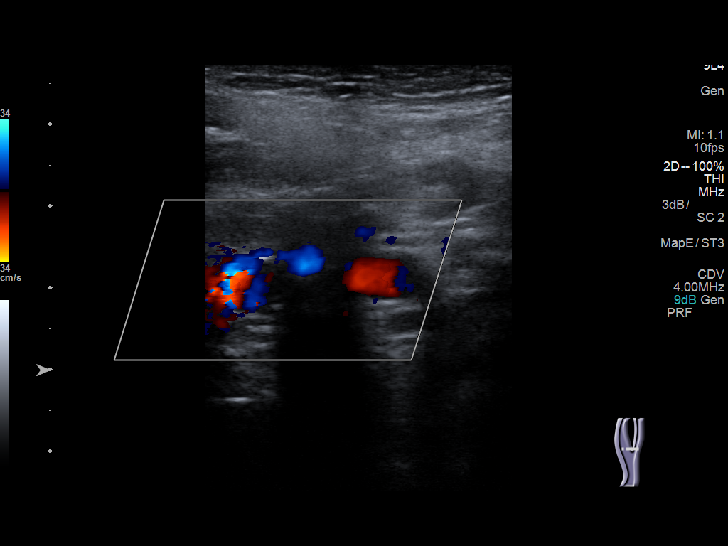
[im 24/68]
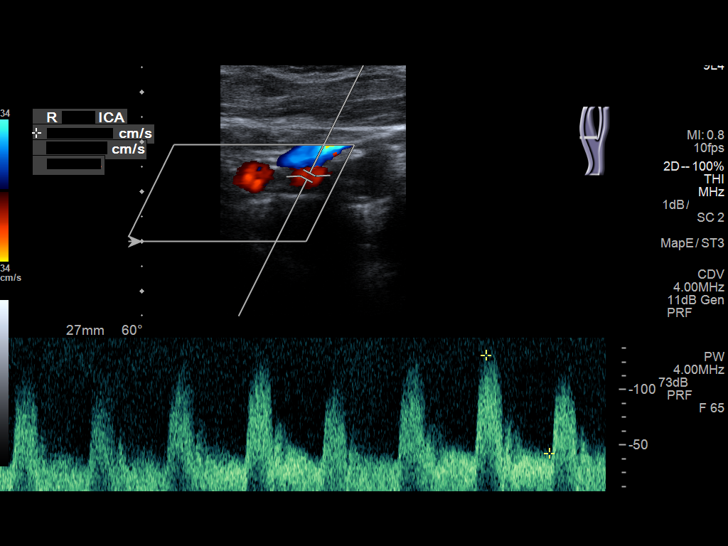
[im 30/68]
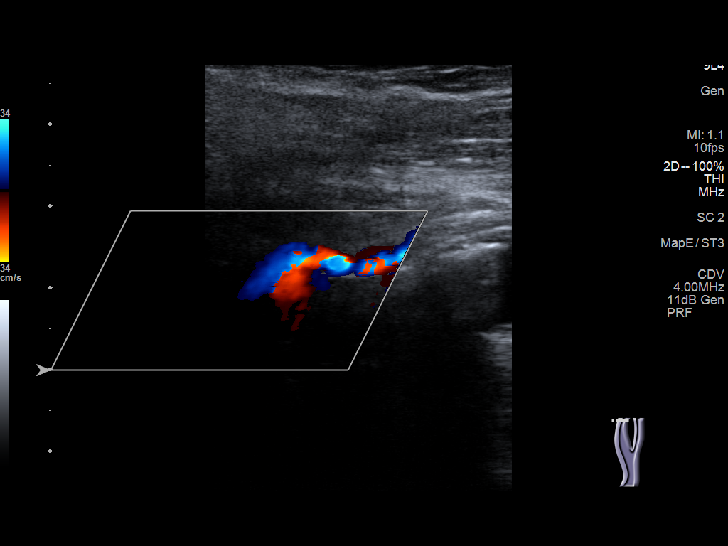
[im 35/68]
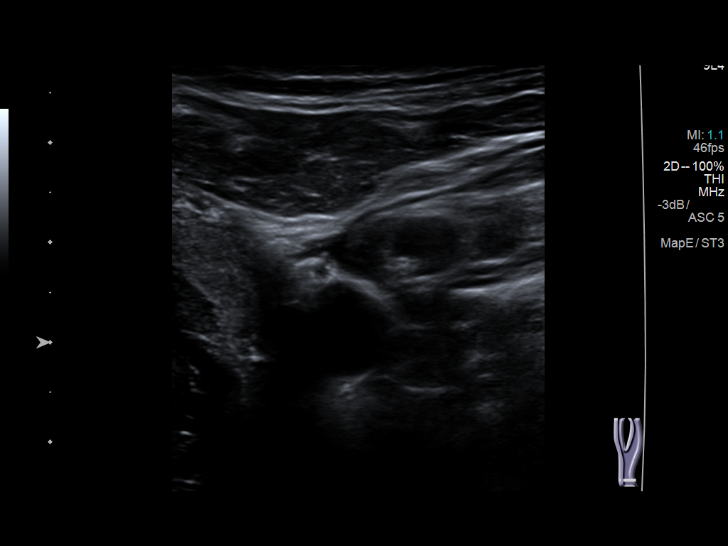
[im 38/68]
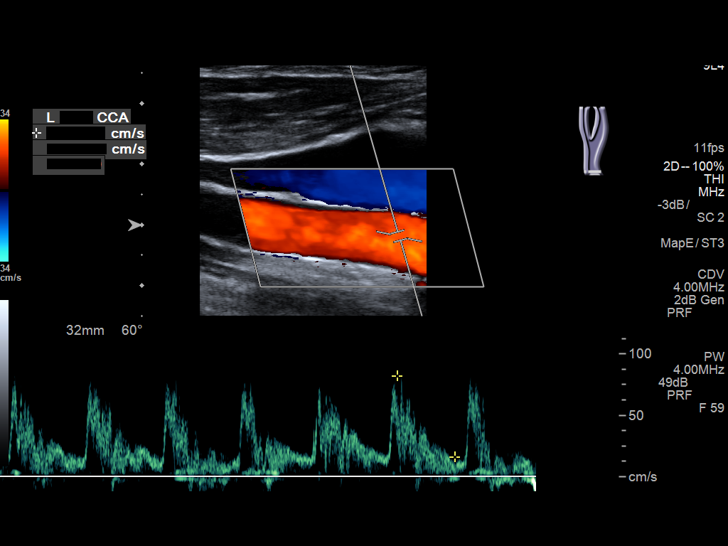
[im 44/68]
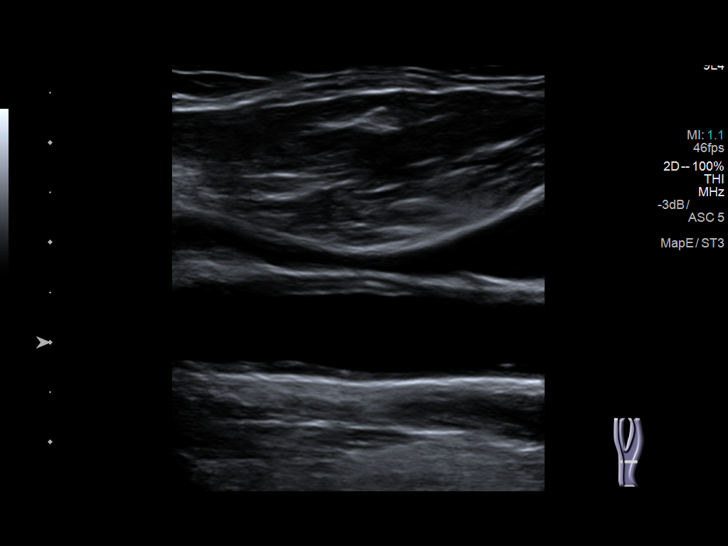
[im 50/68]
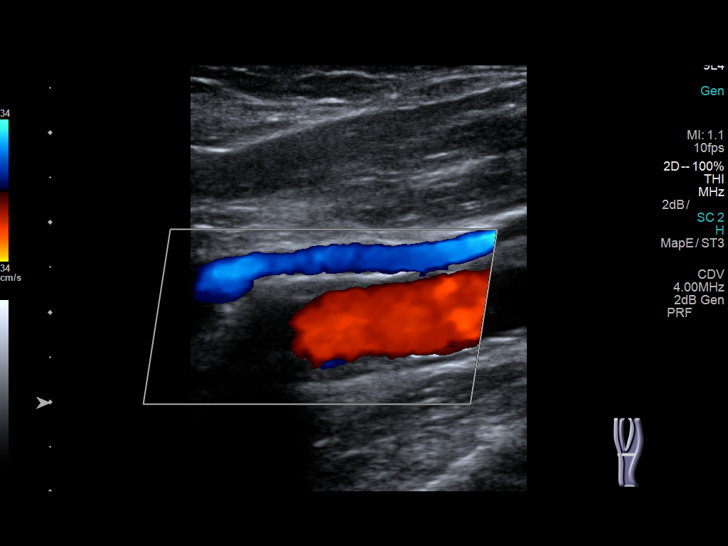
[im 56/68]
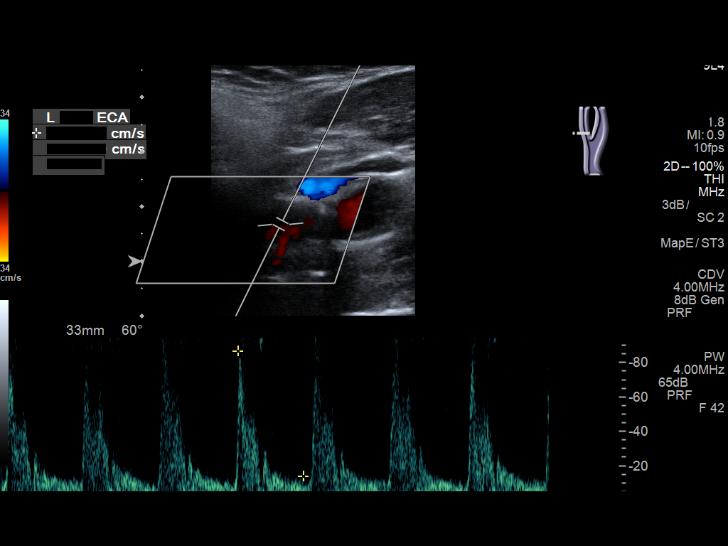
[im 62/68]
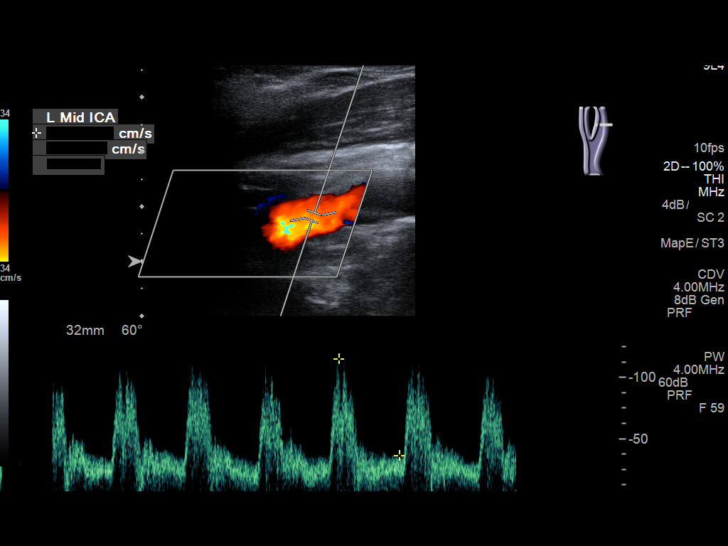
[im 68/68]
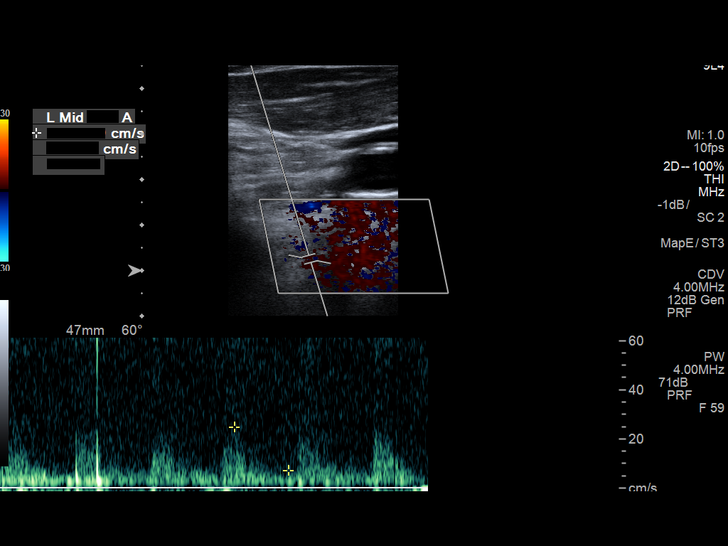

[13 of 24 positions shown; findings below may reference images not displayed]

FINDINGS: Criteria: Quantification of carotid stenosis is based on velocity
parameters that correlate the residual internal carotid diameter
with NASCET-based stenosis levels, using the diameter of the distal
internal carotid lumen as the denominator for stenosis measurement.

The following velocity measurements were obtained:

RIGHT

ICA:  401 cm/sec

CCA:  77 cm/sec

SYSTOLIC ICA/CCA RATIO:

DIASTOLIC ICA/CCA RATIO:

ECA:  71 cm/sec

LEFT

ICA:  115 cm/sec

CCA:  88 cm/sec

SYSTOLIC ICA/CCA RATIO:

DIASTOLIC ICA/CCA RATIO:

ECA:  87 cm/sec

RIGHT CAROTID ARTERY: Mild calcified plaque along the wall of the
bulb. There is severe calcified plaque in the lower internal carotid
artery, just above the bulb. Low resistance internal carotid Doppler
pattern.

RIGHT VERTEBRAL ARTERY:  Nonvisualized.

LEFT CAROTID ARTERY: The regular moderate mixed plaque in the bulb.
Low resistance internal carotid Doppler pattern.

LEFT VERTEBRAL ARTERY:  Antegrade
IMPRESSION: Greater than 70% stenosis in the right internal carotid artery.
Velocities have increased since the prior study.

Less than 50% stenosis in the left internal carotid artery. Plaque
is noted in the bulb.

Flow could not be visualized in the right vertebral artery. Normal
flow was noted in the right vertebral artery on the prior study.
This is a new finding, suggesting right vertebral artery occlusion,
and can be confirmed with a CT angiogram of the neck.

## 2019-03-11 DIAGNOSIS — I10 Essential (primary) hypertension: Secondary | ICD-10-CM | POA: Diagnosis not present

## 2019-03-11 DIAGNOSIS — B353 Tinea pedis: Secondary | ICD-10-CM | POA: Diagnosis not present

## 2019-03-11 DIAGNOSIS — E782 Mixed hyperlipidemia: Secondary | ICD-10-CM | POA: Diagnosis not present

## 2019-03-11 DIAGNOSIS — M1380 Other specified arthritis, unspecified site: Secondary | ICD-10-CM | POA: Diagnosis not present

## 2019-03-11 DIAGNOSIS — M1990 Unspecified osteoarthritis, unspecified site: Secondary | ICD-10-CM | POA: Diagnosis not present

## 2019-03-11 DIAGNOSIS — R69 Illness, unspecified: Secondary | ICD-10-CM | POA: Diagnosis not present

## 2019-03-11 DIAGNOSIS — Z87891 Personal history of nicotine dependence: Secondary | ICD-10-CM | POA: Diagnosis not present

## 2019-03-11 DIAGNOSIS — M109 Gout, unspecified: Secondary | ICD-10-CM | POA: Diagnosis not present

## 2019-03-11 DIAGNOSIS — E1140 Type 2 diabetes mellitus with diabetic neuropathy, unspecified: Secondary | ICD-10-CM | POA: Diagnosis not present

## 2019-03-11 DIAGNOSIS — Z6831 Body mass index (BMI) 31.0-31.9, adult: Secondary | ICD-10-CM | POA: Diagnosis not present

## 2019-04-09 ENCOUNTER — Encounter: Payer: Self-pay | Admitting: Gastroenterology

## 2019-04-13 DIAGNOSIS — R69 Illness, unspecified: Secondary | ICD-10-CM | POA: Diagnosis not present

## 2019-04-13 DIAGNOSIS — M109 Gout, unspecified: Secondary | ICD-10-CM | POA: Diagnosis not present

## 2019-04-13 DIAGNOSIS — I10 Essential (primary) hypertension: Secondary | ICD-10-CM | POA: Diagnosis not present

## 2019-04-13 DIAGNOSIS — M1380 Other specified arthritis, unspecified site: Secondary | ICD-10-CM | POA: Diagnosis not present

## 2019-04-13 DIAGNOSIS — G9009 Other idiopathic peripheral autonomic neuropathy: Secondary | ICD-10-CM | POA: Diagnosis not present

## 2019-04-13 DIAGNOSIS — Z6831 Body mass index (BMI) 31.0-31.9, adult: Secondary | ICD-10-CM | POA: Diagnosis not present

## 2019-04-13 DIAGNOSIS — E1140 Type 2 diabetes mellitus with diabetic neuropathy, unspecified: Secondary | ICD-10-CM | POA: Diagnosis not present

## 2019-04-13 DIAGNOSIS — Z6830 Body mass index (BMI) 30.0-30.9, adult: Secondary | ICD-10-CM | POA: Diagnosis not present

## 2019-04-13 DIAGNOSIS — M1990 Unspecified osteoarthritis, unspecified site: Secondary | ICD-10-CM | POA: Diagnosis not present

## 2019-04-13 DIAGNOSIS — E782 Mixed hyperlipidemia: Secondary | ICD-10-CM | POA: Diagnosis not present

## 2019-04-17 DIAGNOSIS — N400 Enlarged prostate without lower urinary tract symptoms: Secondary | ICD-10-CM | POA: Diagnosis not present

## 2019-04-17 DIAGNOSIS — M545 Low back pain: Secondary | ICD-10-CM | POA: Diagnosis not present

## 2019-04-17 DIAGNOSIS — M109 Gout, unspecified: Secondary | ICD-10-CM | POA: Diagnosis not present

## 2019-04-17 DIAGNOSIS — E663 Overweight: Secondary | ICD-10-CM | POA: Diagnosis not present

## 2019-04-17 DIAGNOSIS — E1140 Type 2 diabetes mellitus with diabetic neuropathy, unspecified: Secondary | ICD-10-CM | POA: Diagnosis not present

## 2019-04-17 DIAGNOSIS — D696 Thrombocytopenia, unspecified: Secondary | ICD-10-CM | POA: Diagnosis not present

## 2019-04-17 DIAGNOSIS — I10 Essential (primary) hypertension: Secondary | ICD-10-CM | POA: Diagnosis not present

## 2019-04-17 DIAGNOSIS — E782 Mixed hyperlipidemia: Secondary | ICD-10-CM | POA: Diagnosis not present

## 2019-04-17 DIAGNOSIS — R69 Illness, unspecified: Secondary | ICD-10-CM | POA: Diagnosis not present

## 2019-04-17 DIAGNOSIS — G9009 Other idiopathic peripheral autonomic neuropathy: Secondary | ICD-10-CM | POA: Diagnosis not present

## 2019-06-10 DIAGNOSIS — E782 Mixed hyperlipidemia: Secondary | ICD-10-CM | POA: Diagnosis not present

## 2019-06-10 DIAGNOSIS — Z87891 Personal history of nicotine dependence: Secondary | ICD-10-CM | POA: Diagnosis not present

## 2019-06-10 DIAGNOSIS — R69 Illness, unspecified: Secondary | ICD-10-CM | POA: Diagnosis not present

## 2019-06-10 DIAGNOSIS — E1140 Type 2 diabetes mellitus with diabetic neuropathy, unspecified: Secondary | ICD-10-CM | POA: Diagnosis not present

## 2019-06-10 DIAGNOSIS — M1380 Other specified arthritis, unspecified site: Secondary | ICD-10-CM | POA: Diagnosis not present

## 2019-06-10 DIAGNOSIS — B353 Tinea pedis: Secondary | ICD-10-CM | POA: Diagnosis not present

## 2019-06-10 DIAGNOSIS — Z6831 Body mass index (BMI) 31.0-31.9, adult: Secondary | ICD-10-CM | POA: Diagnosis not present

## 2019-06-10 DIAGNOSIS — I10 Essential (primary) hypertension: Secondary | ICD-10-CM | POA: Diagnosis not present

## 2019-06-10 DIAGNOSIS — M109 Gout, unspecified: Secondary | ICD-10-CM | POA: Diagnosis not present

## 2019-06-10 DIAGNOSIS — M1990 Unspecified osteoarthritis, unspecified site: Secondary | ICD-10-CM | POA: Diagnosis not present

## 2019-07-11 ENCOUNTER — Other Ambulatory Visit: Payer: Self-pay | Admitting: Cardiology

## 2019-07-20 DIAGNOSIS — C670 Malignant neoplasm of trigone of bladder: Secondary | ICD-10-CM | POA: Diagnosis not present

## 2019-07-29 DIAGNOSIS — I10 Essential (primary) hypertension: Secondary | ICD-10-CM | POA: Diagnosis not present

## 2019-07-29 DIAGNOSIS — Z6831 Body mass index (BMI) 31.0-31.9, adult: Secondary | ICD-10-CM | POA: Diagnosis not present

## 2019-07-29 DIAGNOSIS — M1990 Unspecified osteoarthritis, unspecified site: Secondary | ICD-10-CM | POA: Diagnosis not present

## 2019-07-29 DIAGNOSIS — M109 Gout, unspecified: Secondary | ICD-10-CM | POA: Diagnosis not present

## 2019-07-29 DIAGNOSIS — E782 Mixed hyperlipidemia: Secondary | ICD-10-CM | POA: Diagnosis not present

## 2019-07-29 DIAGNOSIS — R69 Illness, unspecified: Secondary | ICD-10-CM | POA: Diagnosis not present

## 2019-07-29 DIAGNOSIS — M1380 Other specified arthritis, unspecified site: Secondary | ICD-10-CM | POA: Diagnosis not present

## 2019-07-29 DIAGNOSIS — Z87891 Personal history of nicotine dependence: Secondary | ICD-10-CM | POA: Diagnosis not present

## 2019-07-29 DIAGNOSIS — B353 Tinea pedis: Secondary | ICD-10-CM | POA: Diagnosis not present

## 2019-07-29 DIAGNOSIS — E1140 Type 2 diabetes mellitus with diabetic neuropathy, unspecified: Secondary | ICD-10-CM | POA: Diagnosis not present

## 2019-08-14 DIAGNOSIS — R69 Illness, unspecified: Secondary | ICD-10-CM | POA: Diagnosis not present

## 2019-08-19 ENCOUNTER — Other Ambulatory Visit: Payer: Self-pay

## 2019-08-19 ENCOUNTER — Ambulatory Visit: Payer: Medicare HMO | Admitting: Pulmonary Disease

## 2019-08-19 ENCOUNTER — Encounter: Payer: Self-pay | Admitting: Pulmonary Disease

## 2019-08-19 VITALS — BP 142/78 | HR 71 | Ht 69.0 in | Wt 203.8 lb

## 2019-08-19 DIAGNOSIS — G4733 Obstructive sleep apnea (adult) (pediatric): Secondary | ICD-10-CM | POA: Diagnosis not present

## 2019-08-19 IMAGING — NM NM MYOCAR MULTI W/SPECT W/WALL MOTION & EF
2 series · 12 of 12 positions shown · non-contrast
Comparison: none

[Series 1: rest · 6.51mm/px · 6 of 64 frames shown]
[frame 6/64]
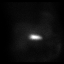
[frame 16/64]
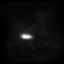
[frame 27/64]
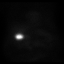
[frame 38/64]
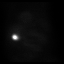
[frame 48/64]
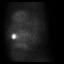
[frame 59/64]
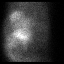

[Series 2: stress gated · 6.51mm/px · 6 of 64 frames shown]
[frame 6/64]
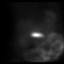
[frame 16/64]
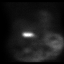
[frame 27/64]
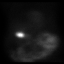
[frame 38/64]
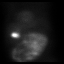
[frame 48/64]
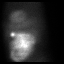
[frame 59/64]
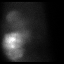

[12 of 12 positions shown; findings below may reference images not displayed]

Canned report from images found in remote index.

Refer to host system for actual result text.

## 2019-08-19 NOTE — Progress Notes (Signed)
[name]    [id number]    [birth year]  Primary Care Physician:Hall, Edwinna Areola, MD  Referring Physician: [name], MD [place],  [place] [address]  Chief complaint:   Patient being seen for obstructive sleep apnea  HPI:  Was diagnosed with obstructive sleep over 30 years ago Has been using CPAP regularly since  Was last seen by Tammy parrett in 2018 Diagnosed with moderate obstructive sleep apnea in 2003-from a study we have on record  Has been following up annually up until 2018  Currently on BiPAP of 14/11 He states that the machine works well Has been having some issues with the humidifier which he had replaced recently  He thinks his current machine is at least 48 to 74 years old, this is his third device  He sleeps well Feels well rested  Usually goes to bed between 10 and 11 PM, final awakening time between 5:30 AM and 7 Wakes up multiple times during the night-this is usually related to needing to use the bathroom, has had a history of bladder cancer  We were unable to get a download from his machine today  His DME company is [place] He states he is pain much more for materials with his current DME as it is not covered by his insurance    Outpatient Encounter Medications as of 08/19/2019  Medication Sig  . allopurinol (ZYLOPRIM) 300 MG tablet Take 300 mg by mouth at bedtime.   Marland Kitchen amLODipine (NORVASC) 10 MG tablet TAKE 1 TABLET BY MOUTH EVERY DAY  . aspirin 81 MG EC tablet Take 81 mg by mouth at bedtime.   . celecoxib (CELEBREX) 200 MG capsule Take 200 mg by mouth 2 (two) times daily.   . clopidogrel (PLAVIX) 75 MG tablet Take 75 mg by mouth daily.   . colchicine 0.6 MG tablet Take 0.6 mg by mouth 2 (two) times daily as needed (gout).   Marland Kitchen docusate sodium (COLACE) 100 MG capsule Take 200 mg by mouth at bedtime.   . dutasteride (AVODART) 0.5 MG capsule Take 0.5 mg by mouth at bedtime.   . ferrous sulfate (FERROUSUL) 325  (65 FE) MG tablet Take 1 tablet (325 mg total) by mouth 3 (three) times daily with meals.  . finasteride (PROSCAR) 5 MG tablet Take 5 mg by mouth at bedtime.  . furosemide (LASIX) 40 MG tablet Take 40 mg by mouth every morning.   . gabapentin (NEURONTIN) 300 MG capsule Take 300 mg by mouth 3 (three) times daily.    Marland Kitchen HYDROcodone-acetaminophen (NORCO) 7.5-325 MG tablet Take 1-2 tablets by mouth every 4 (four) hours as needed for moderate pain.  Marland Kitchen insulin detemir (LEVEMIR) 100 UNIT/ML injection Inject 25 Units into the skin at bedtime.   Marland Kitchen labetalol (NORMODYNE) 300 MG tablet Take 300 mg by mouth 2 (two) times daily.  Marland Kitchen losartan (COZAAR) 100 MG tablet TAKE 1 TABLET BY MOUTH DAILY  . methocarbamol (ROBAXIN) 500 MG tablet Take 1 tablet (500 mg total) by mouth every 6 (six) hours as needed for muscle spasms.  . Omega-3 Fatty Acids (FISH OIL) 1200 MG CAPS Take 1,200 mg by mouth 2 (two) times daily.  . polyethylene glycol powder (GLYCOLAX/MIRALAX) powder TAKE 17MG (1CAPFUL) BY MOUTH(MIXED WITH WATER/JUICE) DAILY (Patient taking differently: TAKE 17MG (1CAPFUL) BY MOUTH(MIXED WITH WATER/JUICE) EVERY EVENING.)  . rosuvastatin (CRESTOR) 10 MG tablet Take 10 mg by mouth every morning.   . [DISCONTINUED] diphenhydramine-acetaminophen (TYLENOL  PM) 25-500 MG TABS Take 1 tablet by mouth at bedtime as needed.    . [DISCONTINUED] potassium chloride (K-DUR) 10 MEQ tablet Take 2 tablets (20 mEq total) by mouth daily.   No facility-administered encounter medications on file as of 08/19/2019.     Allergies as of 08/19/2019 - Review Complete 08/19/2019  Allergen Reaction Noted  . Dilaudid [hydromorphone hcl] Other (See Comments) 04/06/2014  . Toradol [ketorolac tromethamine] Other (See Comments) 02/04/2014  . Lyrica [pregabalin] Nausea Only and Other (See Comments)     Past Medical History:  Diagnosis Date  . Bilateral renal cysts   . Bladder cancer ([place]) UROLOGIST-  DR [facility]   RECURRENT 11/ 2017 --  hx turbt in  2004 and 08-19-2013  . BPH without obstruction/lower urinary tract symptoms   . Cervical fusion syndrome    LEFT ARM NUMBNESS--  CONTROLLED W/ GABAPENTIN  . Coronary artery disease    CARDIOLOGIST-- DR BRANCH [name])  . DDD (degenerative disc disease), cervical   . DDD (degenerative disc disease), lumbar   . Diverticulosis of sigmoid colon   . History of acute gouty arthritis   . History of adenomatous polyp of colon    2001;  2012- non-malignant leiomyoma;  04-07-2014  tubualr adenoma and hyperplastic polyp's  . History of prostate cancer UROLOGIST-  DR [facility]   S/P  RADIOACTIVE SEED IMPLANTS 2004  . HOH (hard of hearing)    no hearing aids  . Hyperlipidemia   . Hypertension   . OA (osteoarthritis)   . Occlusion and stenosis of carotid artery without mention of cerebral infarction CARDIOLOGIST  -- DR  [name]   LAST DUPLEX  11-23-2015  RICA  (DUPLEX DOPPLER 01-20-2016 RICA [id number] PROXIMAL) /   LICA [id number] (HAD CONSULT W/ [name] , VASCULAR SURGEON 01-20-2016)  . OSA on CPAP    BiPAP  . Spondylosis, cervical   . Type 2 diabetes mellitus ([place])   . Wears glasses     Past Surgical History:  Procedure Laterality Date  . ANTERIOR REMOVAL CAGE AND PLATE C3-C7/ CORPECTOMY C7 (FX)/  ALLOGRAFT AND FUSION C3 -- T1/  POSTERIOR DECOMPRESSION BILATERAL LAMINECTOMY C4 -- 6 & PARTIAL C3/  POSTEROLATERAL ARTHRODESIS C3 - T1  06/05/2005  . APPENDECTOMY  1990'S  . CATARACT EXTRACTION W/ INTRAOCULAR LENS  IMPLANT, BILATERAL  2013  . CERVICAL FUSION  05/11/2005   C3  --  C7/  due to post op quadriparesis same day s/p  anterior C 4,5,6, colpectomy, decompression, removal epidural hematoma, foraminnotomy, cage and plate  . COLONOSCOPY  last one 04-07-2014  . CYSTOSCOPY W/ RETROGRADES Bilateral 08/19/2013   Procedure: CYSTOSCOPY WITH RETROGRADE PYELOGRAM;  Surgeon: Molli Hazard, MD;  Location: [facility];  Service: Urology;  Laterality: Bilateral;  .  CYSTOSCOPY W/ RETROGRADES Bilateral 07/18/2016   Procedure: CYSTOSCOPY WITH RETROGRADE PYELOGRAM;  Surgeon: Alexis Frock, MD;  Location: [facility];  Service: Urology;  Laterality: Bilateral;  . ENDOSCOPIC REPAIR CSF LEAK VIA NASAL PASSAGE  2011  . INTRAOPERATIVE ARTERIOGRAM  CATH LAB  01-05-2009  DR Einar Gip   RICA   ACUTE ANGLE 80-85% STENOSIS  . KNEE ARTHROSCOPY Left 07/15/2017   Procedure: ARTHROSCOPY LEFT  KNEE AND DEBRIDEMENT, medial meniscal tear and chondromalaita;  Surgeon: Paralee Cancel, MD;  Location: [facility];  Service: Orthopedics;  Laterality: Left;  60 MINS  . LUMBAR FUSION  03-26-2012   L3 --  L5  . RADIOACTIVE PROSTATE SEED IMPLANTS  08-19-2003  .  SEPTOPLASTY  1998  . TOTAL KNEE ARTHROPLASTY Left 02/25/2018   Procedure: LEFT TOTAL KNEE ARTHROPLASTY;  Surgeon: Paralee Cancel, MD;  Location: [facility];  Service: Orthopedics;  Laterality: Left;  70 mins  . TRANSURETHRAL RESECTION OF BLADDER TUMOR N/A 08/19/2013   Procedure: TRANSURETHRAL RESECTION OF BLADDER TUMOR (TURBT);  Surgeon: Molli Hazard, MD;  Location: [facility];  Service: Urology;  Laterality: N/A;  . TRANSURETHRAL RESECTION OF BLADDER TUMOR N/A 07/18/2016   Procedure: TRANSURETHRAL RESECTION OF BLADDER TUMOR (TURBT);  Surgeon: Alexis Frock, MD;  Location: [facility];  Service: Urology;  Laterality: N/A;  . YAG LASER APPLICATION  [id number]   Procedure: YAG LASER APPLICATION;  Surgeon: Elta Guadeloupe T. Gershon Crane, MD;  Location: [facility];  Service: Ophthalmology;  Laterality: Right;    Family History  Problem Relation Age of Onset  . Breast cancer Mother        mets  . Diabetes Mother   . Heart disease Father        MI  . Colon cancer Brother   . Cancer Sister        male organs  . Diabetes Sister        family hx  . Arthritis Other        entire family  . Diabetes Brother        x 2    Social History   Socioeconomic History  . Marital status: Married    Spouse  name: Not on file  . Number of children: 2  . Years of education: Not on file  . Highest education level: Not on file  Occupational History  . Occupation: retired//disabled  Social Needs  . Financial resource strain: Not on file  . Food insecurity    Worry: Not on file    Inability: Not on file  . Transportation needs    Medical: Not on file    Non-medical: Not on file  Tobacco Use  . Smoking status: Former Smoker    Packs/day: 2.00    Years: 47.00    Pack years: 94.00    Types: Cigarettes    Quit date: 01/20/2018    Years since quitting: 1.5  . Smokeless tobacco: Never Used  . Tobacco comment: pt quits smoking periodically :: started smoking again Jan 2018 1ppd  Substance and Sexual Activity  . Alcohol use: No    Alcohol/week: 0.0 standard drinks  . Drug use: No  . Sexual activity: Not on file  Lifestyle  . Physical activity    Days per week: Not on file    Minutes per session: Not on file  . Stress: Not on file  Relationships  . Social Herbalist on phone: Not on file    Gets together: Not on file    Attends religious service: Not on file    Active member of club or organization: Not on file    Attends meetings of clubs or organizations: Not on file    Relationship status: Not on file  . Intimate partner violence    Fear of current or ex partner: Not on file    Emotionally abused: Not on file    Physically abused: Not on file    Forced sexual activity: Not on file  Other Topics Concern  . Not on file  Social History Narrative  . Not on file    Review of Systems  Respiratory: Positive for apnea.   Musculoskeletal: Positive for arthralgias.  Psychiatric/Behavioral:  Positive for sleep disturbance.  All other systems reviewed and are negative.   Vitals:   08/19/19 1103 08/19/19 1104  BP:  (!) 142/78  Pulse: 71 71  SpO2: 98% 98%     Physical Exam  Constitutional: He appears well-developed and well-nourished.  HENT:  Head: Normocephalic and  atraumatic.  Eyes: Pupils are equal, round, and reactive to light. Conjunctivae and EOM are normal. Right eye exhibits no discharge. Left eye exhibits no discharge.  Neck: Normal range of motion. Neck supple. No tracheal deviation present. No thyromegaly present.  Cardiovascular: Normal rate and regular rhythm.  Pulmonary/Chest: Effort normal and breath sounds normal. No respiratory distress. He has no wheezes. He has no rales.  Abdominal: Soft. Bowel sounds are normal. He exhibits no distension.    No flowsheet data found.   Data Reviewed: Compliance download is not available  Assessment:  Moderate obstructive sleep apnea -On BiPAP therapy 11/14  He feels his machine still works relatively well  No daytime symptoms  Plan/Recommendations: Continue BiPAP use  We will send an order for CPAP supplies to [employer]  Encouraged to call with any significant concerns  Encouraged to stay active  I did discuss the fact that his machine is dated and the fact that we could not download his compliance data-may indicate need for new machine He states he is not having any issues with it and will continue using current device  I will see him back in the office in 3 months     Sherrilyn Rist MD [facility] 08/19/2019, 11:49 AM  CC: [name], MD

## 2019-08-19 NOTE — Patient Instructions (Signed)
History of obstructive sleep apnea  Continue BIPAP use  We will send an order for BiPAP supplies to [place]  If you start having difficulty/issues with your machine -We will need home sleep study to ascertain that you still have significant sleep disordered breathing and be able to set you up with a new machine  I will see you back in the office in about 3 months  Call with any significant concerns

## 2019-08-21 ENCOUNTER — Telehealth: Payer: Self-pay | Admitting: Pulmonary Disease

## 2019-08-21 DIAGNOSIS — G4733 Obstructive sleep apnea (adult) (pediatric): Secondary | ICD-10-CM

## 2019-08-21 NOTE — Telephone Encounter (Signed)
Spoke with pt, he would like for [place] to send an order for BIPAP supplies to [place]. I sent order and called pt to advise him. Nothing further is needed.       Patient Instructions by Laurin Coder, MD at 08/19/2019 11:15 AM Author: Laurin Coder, MD Author Type: Physician Filed: 08/19/2019 11:40 AM  Note Status: Signed Cosign: Cosign Not Required Encounter Date: 08/19/2019  Editor: Laurin Coder, MD (Physician)    History of obstructive sleep apnea  Continue BIPAP use  We will send an order for BiPAP supplies to [facility]  If you start having difficulty/issues with your machine -We will need home sleep study to ascertain that you still have significant sleep disordered breathing and be able to set you up with a new machine  I will see you back in the office in about 3 months  Call with any significant concerns

## 2019-08-27 DIAGNOSIS — G4733 Obstructive sleep apnea (adult) (pediatric): Secondary | ICD-10-CM | POA: Diagnosis not present

## 2019-09-27 DIAGNOSIS — G4733 Obstructive sleep apnea (adult) (pediatric): Secondary | ICD-10-CM | POA: Diagnosis not present

## 2019-10-09 DIAGNOSIS — E1140 Type 2 diabetes mellitus with diabetic neuropathy, unspecified: Secondary | ICD-10-CM | POA: Diagnosis not present

## 2019-10-09 DIAGNOSIS — I10 Essential (primary) hypertension: Secondary | ICD-10-CM | POA: Diagnosis not present

## 2019-10-09 DIAGNOSIS — E782 Mixed hyperlipidemia: Secondary | ICD-10-CM | POA: Diagnosis not present

## 2019-10-21 ENCOUNTER — Other Ambulatory Visit: Payer: Self-pay

## 2019-10-21 ENCOUNTER — Emergency Department (HOSPITAL_COMMUNITY)
Admission: EM | Admit: 2019-10-21 | Discharge: 2019-10-21 | Disposition: A | Discharge status: Home / Self Care | Payer: Medicare HMO | Attending: Emergency Medicine | Admitting: Emergency Medicine

## 2019-10-21 ENCOUNTER — Emergency Department (HOSPITAL_COMMUNITY): Payer: Medicare HMO

## 2019-10-21 ENCOUNTER — Encounter (HOSPITAL_COMMUNITY): Payer: Self-pay | Admitting: Emergency Medicine

## 2019-10-21 DIAGNOSIS — Z8546 Personal history of malignant neoplasm of prostate: Secondary | ICD-10-CM | POA: Diagnosis not present

## 2019-10-21 DIAGNOSIS — I2510 Atherosclerotic heart disease of native coronary artery without angina pectoris: Secondary | ICD-10-CM | POA: Insufficient documentation

## 2019-10-21 DIAGNOSIS — Z7901 Long term (current) use of anticoagulants: Secondary | ICD-10-CM | POA: Diagnosis not present

## 2019-10-21 DIAGNOSIS — Z87891 Personal history of nicotine dependence: Secondary | ICD-10-CM | POA: Insufficient documentation

## 2019-10-21 DIAGNOSIS — Z79899 Other long term (current) drug therapy: Secondary | ICD-10-CM | POA: Insufficient documentation

## 2019-10-21 DIAGNOSIS — Z7982 Long term (current) use of aspirin: Secondary | ICD-10-CM | POA: Diagnosis not present

## 2019-10-21 DIAGNOSIS — M436 Torticollis: Secondary | ICD-10-CM | POA: Insufficient documentation

## 2019-10-21 DIAGNOSIS — Z794 Long term (current) use of insulin: Secondary | ICD-10-CM | POA: Diagnosis not present

## 2019-10-21 DIAGNOSIS — E119 Type 2 diabetes mellitus without complications: Secondary | ICD-10-CM | POA: Diagnosis not present

## 2019-10-21 DIAGNOSIS — M542 Cervicalgia: Secondary | ICD-10-CM | POA: Diagnosis present

## 2019-10-21 DIAGNOSIS — I10 Essential (primary) hypertension: Secondary | ICD-10-CM | POA: Diagnosis not present

## 2019-10-21 MED ORDER — MORPHINE SULFATE (PF) 4 MG/ML IV SOLN
6.0000 mg | Freq: Once | INTRAVENOUS | Status: AC
  Administered 2019-10-21: 6 mg via INTRAVENOUS
  Filled 2019-10-21: qty 2

## 2019-10-21 MED ORDER — MORPHINE SULFATE (PF) 4 MG/ML IV SOLN
4.0000 mg | Freq: Once | INTRAVENOUS | Status: AC
  Administered 2019-10-21: 4 mg via INTRAVENOUS
  Filled 2019-10-21: qty 1

## 2019-10-21 MED ORDER — METHOCARBAMOL 1000 MG/10ML IJ SOLN
500.0000 mg | Freq: Once | INTRAVENOUS | Status: AC
  Administered 2019-10-21: 500 mg via INTRAVENOUS
  Filled 2019-10-21: qty 5

## 2019-10-21 MED ORDER — OXYCODONE-ACETAMINOPHEN 5-325 MG PO TABS: 1.0000 | ORAL_TABLET | Freq: Four times a day (QID) | ORAL | 0 refills | Status: DC | PRN

## 2019-10-21 MED ORDER — LORAZEPAM 2 MG/ML IJ SOLN
0.5000 mg | Freq: Once | INTRAMUSCULAR | Status: AC
  Administered 2019-10-21: 0.5 mg via INTRAVENOUS
  Filled 2019-10-21: qty 1

## 2019-10-21 NOTE — ED Provider Notes (Signed)
[facility] EMERGENCY DEPARTMENT Provider Note   CSN: [id number] Arrival date & time: 10/21/19  1017     History Chief Complaint  Patient presents with  . Neck Pain    [name] is a 74 y.o. male.  Patient complains of left lateral neck pain.  No history of trauma  The history is provided by the patient.  Neck Pain Pain location:  L side Quality:  Aching Pain radiates to:  Does not radiate Pain severity:  Moderate Pain is:  Worse during the day Onset quality:  Sudden Timing:  Constant Progression:  Worsening Chronicity:  New Context: not fall   Associated symptoms: no chest pain and no headaches        Past Medical History:  Diagnosis Date  . Bilateral renal cysts   . Bladder cancer ([place]) UROLOGIST-  DR [facility]   RECURRENT 11/ 2017 --  hx turbt in 2004 and 08-19-2013  . BPH without obstruction/lower urinary tract symptoms   . Cervical fusion syndrome    LEFT ARM NUMBNESS--  CONTROLLED W/ GABAPENTIN  . Coronary artery disease    CARDIOLOGIST-- DR BRANCH [name])  . DDD (degenerative disc disease), cervical   . DDD (degenerative disc disease), lumbar   . Diverticulosis of sigmoid colon   . History of acute gouty arthritis   . History of adenomatous polyp of colon    2001;  2012- non-malignant leiomyoma;  04-07-2014  tubualr adenoma and hyperplastic polyp's  . History of prostate cancer UROLOGIST-  DR [facility]   S/P  RADIOACTIVE SEED IMPLANTS 2004  . HOH (hard of hearing)    no hearing aids  . Hyperlipidemia   . Hypertension   . OA (osteoarthritis)   . Occlusion and stenosis of carotid artery without mention of cerebral infarction CARDIOLOGIST  -- DR  [name]   LAST DUPLEX  11-23-2015  RICA  (DUPLEX DOPPLER 01-20-2016 RICA [id number] PROXIMAL) /   LICA [id number] (HAD CONSULT W/ [name] , VASCULAR SURGEON 01-20-2016)  . OSA on CPAP    BiPAP  . Spondylosis, cervical   . Type 2 diabetes mellitus ([place])   . Wears glasses     Patient Active Problem  List   Diagnosis Date Noted  . Obese 02/28/2018  . S/P left TKA 02/25/2018  . S/P total knee replacement 02/25/2018  . Fever 10/09/2016  . Community acquired pneumonia of left lower lobe of lung 10/09/2016  . Gram negative sepsis ([place]) 02/05/2014  . UTI (urinary tract infection) 02/04/2014  . Sepsis ([place]) 02/04/2014  . DM type 2 (diabetes mellitus, type 2) ([place]) 02/04/2014  . Tobacco abuse 02/04/2014  . Preop cardiovascular exam 08/05/2013  . CAD (coronary artery disease) 08/05/2012  . Degenerative lumbar spinal stenosis 04/03/2012  . Chest pain 04/03/2012  . Anemia 04/03/2012  . Smoking 06/13/2011  . JOINT EFFUSION, KNEE 01/11/2010  . ARTHRITIS, RIGHT KNEE 12/12/2009  . PAIN IN JOINT, LOWER LEG 12/12/2009  . PROSTATE CANCER 11/08/2009  . GOUTY ARTHRITIS, CHRONIC 11/08/2009  . Overweight 11/08/2009  . HYPERTENSION, BENIGN 11/08/2009  . Carotid artery disease without cerebral infarction ([place]) 11/08/2009  . Malignant neoplasm of bladder ([place]) 03/22/2008  . Hyperlipidemia 03/22/2008  . PAD (peripheral artery disease) ([place]) 03/22/2008  . CONSTIPATION, CHRONIC 03/22/2008  . DEGENERATIVE JOINT DISEASE 03/22/2008  . OSA (obstructive sleep apnea) 03/22/2008  . PROSTATE CANCER, HX OF 03/22/2008  . DIVERTICULITIS, HX OF 03/22/2008    Past Surgical History:  Procedure Laterality Date  . ANTERIOR REMOVAL  CAGE AND PLATE C3-C7/ CORPECTOMY C7 (FX)/  ALLOGRAFT AND FUSION C3 -- T1/  POSTERIOR DECOMPRESSION BILATERAL LAMINECTOMY C4 -- 6 & PARTIAL C3/  POSTEROLATERAL ARTHRODESIS C3 - T1  06/05/2005  . APPENDECTOMY  1990'S  . CATARACT EXTRACTION W/ INTRAOCULAR LENS  IMPLANT, BILATERAL  2013  . CERVICAL FUSION  05/11/2005   C3  --  C7/  due to post op quadriparesis same day s/p  anterior C 4,5,6, colpectomy, decompression, removal epidural hematoma, foraminnotomy, cage and plate  . COLONOSCOPY  last one 04-07-2014  . CYSTOSCOPY W/ RETROGRADES Bilateral 08/19/2013   Procedure: CYSTOSCOPY WITH  RETROGRADE PYELOGRAM;  Surgeon: Molli Hazard, MD;  Location: [facility];  Service: Urology;  Laterality: Bilateral;  . CYSTOSCOPY W/ RETROGRADES Bilateral 07/18/2016   Procedure: CYSTOSCOPY WITH RETROGRADE PYELOGRAM;  Surgeon: Alexis Frock, MD;  Location[facility];  Service: Urology;  Laterality: Bilateral;  . ENDOSCOPIC REPAIR CSF LEAK VIA NASAL PASSAGE  2011  . INTRAOPERATIVE ARTERIOGRAM  CATH LAB  01-05-2009  DR Einar Gip   RICA   ACUTE ANGLE 80-85% STENOSIS  . KNEE ARTHROSCOPY Left 07/15/2017   Procedure: ARTHROSCOPY LEFT  KNEE AND DEBRIDEMENT, medial meniscal tear and chondromalaita;  Surgeon: Paralee Cancel, MD;  Location: [facility];  Service: Orthopedics;  Laterality: Left;  60 MINS  . LUMBAR FUSION  03-26-2012   L3 --  L5  . RADIOACTIVE PROSTATE SEED IMPLANTS  08-19-2003  . SEPTOPLASTY  1998  . TOTAL KNEE ARTHROPLASTY Left 02/25/2018   Procedure: LEFT TOTAL KNEE ARTHROPLASTY;  Surgeon: Paralee Cancel, MD;  Location: [facility];  Service: Orthopedics;  Laterality: Left;  70 mins  . TRANSURETHRAL RESECTION OF BLADDER TUMOR N/A 08/19/2013   Procedure: TRANSURETHRAL RESECTION OF BLADDER TUMOR (TURBT);  Surgeon: Molli Hazard, MD;  Location: [facility];  Service: Urology;  Laterality: N/A;  . TRANSURETHRAL RESECTION OF BLADDER TUMOR N/A 07/18/2016   Procedure: TRANSURETHRAL RESECTION OF BLADDER TUMOR (TURBT);  Surgeon: Alexis Frock, MD;  Location: [facility];  Service: Urology;  Laterality: N/A;  . YAG LASER APPLICATION  [id number]   Procedure: YAG LASER APPLICATION;  Surgeon: Elta Guadeloupe T. Gershon Crane, MD;  Location: [facility];  Service: Ophthalmology;  Laterality: Right;       Family History  Problem Relation Age of Onset  . Breast cancer Mother        mets  . Diabetes Mother   . Heart disease Father        MI  . Colon cancer Brother   . Cancer Sister        male organs  . Diabetes Sister        family hx  . Arthritis  Other        entire family  . Diabetes Brother        x 2    Social History   Tobacco Use  . Smoking status: Former Smoker    Packs/day: 2.00    Years: 47.00    Pack years: 94.00    Types: Cigarettes    Quit date: 01/20/2018    Years since quitting: 1.7  . Smokeless tobacco: Never Used  . Tobacco comment: pt quits smoking periodically :: started smoking again Jan 2018 1ppd  Substance Use Topics  . Alcohol use: No    Alcohol/week: 0.0 standard drinks  . Drug use: No    Home Medications Prior to Admission medications   Medication Sig Start Date End Date Taking? Authorizing Provider  allopurinol (  ZYLOPRIM) 300 MG tablet Take 300 mg by mouth at bedtime.    Yes [provider]  amLODipine (NORVASC) 10 MG tablet TAKE 1 TABLET BY MOUTH EVERY DAY 07/24/18  Yes Branch, Alphonse Guild, MD  aspirin 81 MG EC tablet Take 81 mg by mouth at bedtime.    Yes [provider]  celecoxib (CELEBREX) 200 MG capsule Take 200 mg by mouth 2 (two) times daily.    Yes [provider]  clopidogrel (PLAVIX) 75 MG tablet Take 75 mg by mouth daily.  07/11/16  Yes [provider]  colchicine 0.6 MG tablet Take 0.6 mg by mouth 2 (two) times daily as needed (gout).    Yes [provider]  cyclobenzaprine (FLEXERIL) 10 MG tablet Take 10 mg by mouth 3 (three) times daily as needed. 10/17/19  Yes [provider]  docusate sodium (COLACE) 100 MG capsule Take 200 mg by mouth at bedtime.    Yes [provider]  finasteride (PROSCAR) 5 MG tablet Take 5 mg by mouth at bedtime.   Yes [provider]  furosemide (LASIX) 40 MG tablet Take 40 mg by mouth every morning.    Yes [provider]  gabapentin (NEURONTIN) 300 MG capsule Take 300 mg by mouth 3 (three) times daily.     Yes [provider]  HYDROcodone-acetaminophen (NORCO) 7.5-325 MG tablet Take 1-2 tablets by mouth every 4 (four) hours as needed for moderate pain. 02/25/18  Yes Babish,  Rodman Key, PA-C  insulin detemir (LEVEMIR) 100 UNIT/ML injection Inject 25 Units into the skin at bedtime.    Yes [provider]  labetalol (NORMODYNE) 300 MG tablet Take 300 mg by mouth 2 (two) times daily.   Yes [provider]  losartan (COZAAR) 100 MG tablet TAKE 1 TABLET BY MOUTH DAILY 07/13/19  Yes Branch, Alphonse Guild, MD  polyethylene glycol powder (GLYCOLAX/MIRALAX) powder TAKE 17MG (1CAPFUL) BY MOUTH(MIXED WITH WATER/JUICE) DAILY Patient taking differently: TAKE 17MG (1CAPFUL) BY MOUTH(MIXED WITH WATER/JUICE) EVERY EVENING. 05/02/15  Yes Lafayette Dragon, MD  rosuvastatin (CRESTOR) 10 MG tablet Take 10 mg by mouth every morning.    Yes [provider]  traMADol (ULTRAM) 50 MG tablet Take 50 mg by mouth every 6 (six) hours as needed. 10/14/19  Yes [provider]  ferrous sulfate (FERROUSUL) 325 (65 FE) MG tablet Take 1 tablet (325 mg total) by mouth 3 (three) times daily with meals. Patient not taking: Reported on 10/21/2019 02/25/18   Danae Orleans, PA-C  methocarbamol (ROBAXIN) 500 MG tablet Take 1 tablet (500 mg total) by mouth every 6 (six) hours as needed for muscle spasms. Patient not taking: Reported on 10/21/2019 02/25/18   Danae Orleans, PA-C  Omega-3 Fatty Acids (FISH OIL) 1200 MG CAPS Take 1,200 mg by mouth 2 (two) times daily.    [provider]  oxyCODONE-acetaminophen (PERCOCET/ROXICET) 5-325 MG tablet Take 1 tablet by mouth every 6 (six) hours as needed. 10/21/19   Milton Ferguson, MD  diphenhydramine-acetaminophen (TYLENOL PM) 25-500 MG TABS Take 1 tablet by mouth at bedtime as needed.    12/04/11  [provider]  potassium chloride (K-DUR) 10 MEQ tablet Take 2 tablets (20 mEq total) by mouth daily. 09/17/11 02/17/18  Minna Merritts, MD    Allergies    Dilaudid [hydromorphone hcl], Toradol [ketorolac tromethamine], and Lyrica [pregabalin]  Review of Systems   Review of Systems  Constitutional: Negative for appetite change and  fatigue.  HENT: Negative for congestion, ear discharge and sinus pressure.  Eyes: Negative for discharge.  Respiratory: Negative for cough.   Cardiovascular: Negative for chest pain.  Gastrointestinal: Negative for abdominal pain and diarrhea.  Genitourinary: Negative for frequency and hematuria.  Musculoskeletal: Positive for neck pain. Negative for back pain.  Skin: Negative for rash.  Neurological: Negative for seizures and headaches.  Psychiatric/Behavioral: Negative for hallucinations.    Physical Exam Updated Vital Signs BP 101/77   Pulse 65   Temp 97.9 F (36.6 C) (Oral)   Resp 18   Ht 5\' 9"  (1.753 m)   Wt 94.8 kg   SpO2 95%   BMI 30.86 kg/m   Physical Exam Vitals and nursing note reviewed.  Constitutional:      Miles: He is in acute distress.     Appearance: He is well-developed.  HENT:     Head: Normocephalic.  Eyes:     Miles: No scleral icterus.    Conjunctiva/sclera: Conjunctivae normal.  Neck:     Thyroid: No thyromegaly.     Comments: Tender left lateral neck over the sternocleidomastoid  muscle Cardiovascular:     Rate and Rhythm: Normal rate and regular rhythm.     Heart sounds: No murmur. No friction rub. No gallop.   Pulmonary:     Breath sounds: No stridor. No wheezing or rales.  Chest:     Chest wall: No tenderness.  Abdominal:     Miles: There is no distension.     Tenderness: There is no abdominal tenderness. There is no rebound.  Musculoskeletal:        Miles: Normal range of motion.  Lymphadenopathy:     Cervical: No cervical adenopathy.  Skin:    Findings: No erythema or rash.  Neurological:     Mental Status: He is alert and oriented to person, place, and time.     Motor: No abnormal muscle tone.     Coordination: Coordination normal.  Psychiatric:        Behavior: Behavior normal.     ED Results / Procedures / Treatments   Labs (all labs ordered are listed, but only abnormal results are displayed) Labs Reviewed -  No data to display  EKG None  Radiology DG Cervical Spine 2-3 Views  Result Date: 10/21/2019 CLINICAL DATA:  Chronic neck pain which began severe last night. Previous anterior and posterior fusion from C3 to T1. EXAM: CERVICAL SPINE - 2-3 VIEW COMPARISON:  Radiograph dated 06/07/2005 FINDINGS: There is solid fusion anteriorly and posteriorly from C3-T1. Alignment is normal. Prevertebral soft tissues are normal. Minimal degenerative changes between the anterior arch of C1 and the odontoid process of C2. No acute abnormalities. No loosening of the hardware. The patient has had corpectomy from C4 to at least C6 with a graft in place. IMPRESSION: No acute abnormality. Solid fusion from C3-T1. Electronically Signed   By: Lorriane Shire M.D.   On: 10/21/2019 12:23    Procedures Procedures (including critical care time)  Medications Ordered in ED Medications  LORazepam (ATIVAN) injection 0.5 mg (0.5 mg Intravenous Given 10/21/19 1057)  morphine 4 MG/ML injection 6 mg (6 mg Intravenous Given 10/21/19 1058)  morphine 4 MG/ML injection 4 mg (4 mg Intravenous Given 10/21/19 1129)  methocarbamol (ROBAXIN) 500 mg in dextrose 5 % 50 mL IVPB (0 mg Intravenous Stopped 10/21/19 1433)    ED Course  I have reviewed the triage vital signs and the nursing notes.  Pertinent labs & imaging results that were available during my care of the patient were reviewed by  me and considered in my medical decision making (see chart for details).    MDM Rules/Calculators/A&P                     Patient with severe torticollis.  He was started on muscle relaxer yesterday and he will continue that.  We will start him on some pain medicine and have given him a soft collar and referred him to orthopedic specialist Final Clinical Impression(s) / ED Diagnoses Final diagnoses:  Torticollis, acute    Rx / DC Orders ED Discharge Orders         Ordered    oxyCODONE-acetaminophen (PERCOCET/ROXICET) 5-325 MG tablet  Every 6 hours  PRN     10/21/19 1441           Milton Ferguson, MD 10/21/19 1446

## 2019-10-21 NOTE — ED Triage Notes (Signed)
Pt reports left sided neck pain. Pt reports pain x2 months but reports became severe last night. Pt reports pain is in bilateral shoulders and left side of neck. Pt denies any known injury.

## 2019-10-21 NOTE — Discharge Instructions (Signed)
Follow-up with your family doctor or Dr. Aline Brochure next week for recheck

## 2019-11-26 ENCOUNTER — Ambulatory Visit: Payer: Medicare HMO | Admitting: Neurology

## 2019-12-24 ENCOUNTER — Ambulatory Visit: Payer: Medicare HMO | Admitting: Neurology

## 2019-12-24 ENCOUNTER — Encounter: Payer: Self-pay | Admitting: Neurology

## 2019-12-24 ENCOUNTER — Other Ambulatory Visit: Payer: Self-pay

## 2019-12-24 VITALS — BP 145/83 | HR 74 | Temp 97.5°F | Ht 67.0 in | Wt 205.0 lb

## 2019-12-24 DIAGNOSIS — G250 Essential tremor: Secondary | ICD-10-CM | POA: Diagnosis not present

## 2019-12-24 NOTE — Progress Notes (Signed)
Subjective:    Patient ID: [name] is a 74 y.o. male.  HPI     Ricky Age, MD, PhD [name] [address] [place], [place] [address]  Dear Ricky. Rolena Miles,   I saw your patient, [name], upon your kind request, in my Neurologic clinic today for initial consultation of his hand tremors.  The patient is unaccompanied today.  As you know, [name] is a 74 year old right-handed gentleman with an underlying complex medical history of gout, arthritis, degenerative spine disease, coronary artery disease, carotid artery disease, obstructive sleep apnea, diabetes, osteoarthritis, hypertension, hyperlipidemia, hearing loss, prostate cancer, history of bladder cancer, chronic pain, on chronic narcotic pain medication, and obesity, who reports a longstanding history of hand tremors, approximately 15 years, worse in the past year, particularly in the RUE.  I have reviewed your office records, including your note from 11/18/2019. He was noted to have a hand tremor b/l. He is status post neck surgery thrice, under Ricky. Joya Miles.  He has seen Ricky. Nelva Miles for pain management.  Of note, he is on multiple medications including potentially sedating medications including Gabapentin 300 mg 3 times daily, hydrocodone as needed.  He reports that he is no longer on oxycodone.  He reports that he does not take the tramadol.  He has on his list Robaxin and Flexeril but reports that he does not take these.  He has an older list of medications which has Zanaflex on it and he indicates that he does take 1 a day.  He reports a family history of tremors, his older brother has a tremor.  He is not aware of any family history of tremor otherwise.  His parents did not have a tremor his grandparents as he recalls but his grandparents did not live very long.  His father drink alcohol excessively as he indicates.  He had a total of 5 brothers, 1 passed away, he had a total of 2 sisters, one  passed away.  Patient lives with his wife, he received a recent neck injection but reports it only helped for about 3 days.  He takes Celebrex as needed and also ibuprofen and Tylenol as needed. He is also on a beta blocker.  He had a brain MRI wo contrast on 12/20/11, and I reviewed the results: IMPRESSION:   1.  No acute or focal abnormality explain visual disturbances. 2.  Scattered periventricular subcortical T2 and FLAIR hyperintensities are slightly exaggerated for Miles.  The finding is nonspecific but can be seen in the setting of chronic microvascular ischemia, a demyelinating process such as multiple sclerosis, vasculitis, complicated migraine headaches, or as the sequelae of a prior infectious or inflammatory process. 3.  Previous sinus and eye surgery bilaterally. 4.  Residual bilateral maxillary ethmoid sinus disease as described.  He reports that his biggest issue is his severe neck pain.  He reports that he has lived with his tremor.  It started about 15 years ago but has become worse in the past year.  It is more so in the right hand.  He denies any lower extremity tremor.  His tremor is primarily noticeable with activity, when he holds something or he feeds himself.  He typically uses a spoon and has a lid on his mug for coffee. He drinks 1 mug of coffee in the morning, 2 sodas per day, diet Ricky. Malachi Miles. He smokes 1 pack/day and drinks no alcohol.   His Past Medical History Is Significant  For: Past Medical History:  Diagnosis Date  . Bilateral renal cysts   . Bladder cancer ([place]) UROLOGIST-  [name]   RECURRENT 11/ 2017 --  hx turbt in 2004 and 08-19-2013  . BPH without obstruction/lower urinary tract symptoms   . Cervical fusion syndrome    LEFT ARM NUMBNESS--  CONTROLLED W/ GABAPENTIN  . Coronary artery disease    CARDIOLOGIST-- [name] [name])  . DDD (degenerative disc disease), cervical   . DDD (degenerative disc disease), lumbar   . Diverticulosis of  sigmoid colon   . History of acute gouty arthritis   . History of adenomatous polyp of colon    2001;  2012- non-malignant leiomyoma;  04-07-2014  tubualr adenoma and hyperplastic polyp's  . History of prostate cancer UROLOGIST-  [name]   S/P  RADIOACTIVE SEED IMPLANTS 2004  . HOH (hard of hearing)    no hearing aids  . Hyperlipidemia   . Hypertension   . OA (osteoarthritis)   . Occlusion and stenosis of carotid artery without mention of cerebral infarction CARDIOLOGIST  -- Ricky  Roderic Palau Miles   LAST DUPLEX  11-23-2015  RICA  (DUPLEX DOPPLER 01-20-2016 RICA [id number] PROXIMAL) /   LICA [id number] (HAD CONSULT W/ Ricky [name] , VASCULAR SURGEON 01-20-2016)  . OSA on CPAP    BiPAP  . Spondylosis, cervical   . Type 2 diabetes mellitus ([place])   . Wears glasses     His Past Surgical History Is Significant For: Past Surgical History:  Procedure Laterality Date  . ANTERIOR REMOVAL CAGE AND PLATE C3-C7/ CORPECTOMY C7 (FX)/  ALLOGRAFT AND FUSION C3 -- T1/  POSTERIOR DECOMPRESSION BILATERAL LAMINECTOMY C4 -- 6 & PARTIAL C3/  POSTEROLATERAL ARTHRODESIS C3 - T1  06/05/2005  . APPENDECTOMY  1990'S  . CATARACT EXTRACTION W/ INTRAOCULAR LENS  IMPLANT, BILATERAL  2013  . CERVICAL FUSION  05/11/2005   C3  --  C7/  due to post op quadriparesis same day s/p  anterior C 4,5,6, colpectomy, decompression, removal epidural hematoma, foraminnotomy, cage and plate  . COLONOSCOPY  last one 04-07-2014  . CYSTOSCOPY W/ RETROGRADES Bilateral 08/19/2013   Procedure: CYSTOSCOPY WITH RETROGRADE PYELOGRAM;  Surgeon: Molli Hazard, MD;  [facility];  Service: Urology;  Laterality: Bilateral;  . CYSTOSCOPY W/ RETROGRADES Bilateral 07/18/2016   Procedure: CYSTOSCOPY WITH RETROGRADE PYELOGRAM;  Surgeon: Alexis Frock, MD;  Location[facility];  Service: Urology;  Laterality: Bilateral;  . ENDOSCOPIC REPAIR CSF LEAK VIA NASAL PASSAGE  2011  . INTRAOPERATIVE ARTERIOGRAM  CATH LAB   01-05-2009  Ricky Einar Gip   RICA   ACUTE ANGLE 80-85% STENOSIS  . KNEE ARTHROSCOPY Left 07/15/2017   Procedure: ARTHROSCOPY LEFT  KNEE AND DEBRIDEMENT, medial meniscal tear and chondromalaita;  Surgeon: Paralee Cancel, MD;  Location: [facility];  Service: Orthopedics;  Laterality: Left;  60 MINS  . LUMBAR FUSION  03-26-2012   L3 --  L5  . RADIOACTIVE PROSTATE SEED IMPLANTS  08-19-2003  . SEPTOPLASTY  1998  . TOTAL KNEE ARTHROPLASTY Left 02/25/2018   Procedure: LEFT TOTAL KNEE ARTHROPLASTY;  Surgeon: Paralee Cancel, MD;  Location: [facility];  Service: Orthopedics;  Laterality: Left;  70 mins  . TRANSURETHRAL RESECTION OF BLADDER TUMOR N/A 08/19/2013   Procedure: TRANSURETHRAL RESECTION OF BLADDER TUMOR (TURBT);  Surgeon: Molli Hazard, MD;  Location: [facility];  Service: Urology;  Laterality: N/A;  . TRANSURETHRAL RESECTION OF BLADDER TUMOR N/A 07/18/2016   Procedure: TRANSURETHRAL  RESECTION OF BLADDER TUMOR (TURBT);  Surgeon: Alexis Frock, MD;  Location[facility];  Service: Urology;  Laterality: N/A;  . YAG LASER APPLICATION  [id number]   Procedure: YAG LASER APPLICATION;  Surgeon: Elta Guadeloupe T. Gershon Crane, MD;  Location: [facility];  Service: Ophthalmology;  Laterality: Right;    His Family History Is Significant For: Family History  Problem Relation Miles of Onset  . Breast cancer Mother        mets  . Diabetes Mother   . Heart disease Father        MI  . Colon cancer Brother   . Cancer Sister        male organs  . Diabetes Sister        family hx  . Arthritis Other        entire family  . Diabetes Brother        x 2    His Social History Is Significant For: Social History   Socioeconomic History  . Marital status: Married    Spouse name: Not on file  . Number of children: 2  . Years of education: Not on file  . Highest education level: Not on file  Occupational History  . Occupation: retired//disabled  Tobacco Use  . Smoking status: Former Smoker     Packs/day: 2.00    Years: 47.00    Pack years: 94.00    Types: Cigarettes    Quit date: 01/20/2018    Years since quitting: 1.9  . Smokeless tobacco: Never Used  . Tobacco comment: pt quits smoking periodically :: started smoking again Jan 2018 1ppd  Substance and Sexual Activity  . Alcohol use: No    Alcohol/week: 0.0 standard drinks  . Drug use: No  . Sexual activity: Not on file  Other Topics Concern  . Not on file  Social History Narrative  . Not on file   Social Determinants of Health   Financial Resource Strain:   . Difficulty of Paying Living Expenses:   Food Insecurity:   . Worried About Charity fundraiser in the Last Year:   . Arboriculturist in the Last Year:   Transportation Needs:   . Film/video editor (Medical):   Marland Kitchen Lack of Transportation (Non-Medical):   Physical Activity:   . Days of Exercise per Week:   . Minutes of Exercise per Session:   Stress:   . Feeling of Stress :   Social Connections:   . Frequency of Communication with Friends and Family:   . Frequency of Social Gatherings with Friends and Family:   . Attends Religious Services:   . Active Member of Clubs or Organizations:   . Attends Archivist Meetings:   Marland Kitchen Marital Status:     His Allergies Are:  Allergies  Allergen Reactions  . Dilaudid [Hydromorphone Hcl] Other (See Comments)    "acts a little off"  . Toradol [Ketorolac Tromethamine] Other (See Comments)    CauseD  hallucination ?-states not sure/ avoids per his MD  . Lyrica [Pregabalin] Nausea Only and Other (See Comments)    Make me feel "bad"  :   His Current Medications Are:  Outpatient Encounter Medications as of 12/24/2019  Medication Sig  . allopurinol (ZYLOPRIM) 300 MG tablet Take 300 mg by mouth at bedtime.   Marland Kitchen amLODipine (NORVASC) 10 MG tablet TAKE 1 TABLET BY MOUTH EVERY DAY  . aspirin 81 MG EC tablet Take 81 mg by mouth at bedtime.   Marland Kitchen  celecoxib (CELEBREX) 200 MG capsule Take 200 mg by mouth 2 (two) times  daily.   . clopidogrel (PLAVIX) 75 MG tablet Take 75 mg by mouth daily.   . colchicine 0.6 MG tablet Take 0.6 mg by mouth 2 (two) times daily as needed (gout).   . cyclobenzaprine (FLEXERIL) 10 MG tablet Take 10 mg by mouth 3 (three) times daily as needed.  . docusate sodium (COLACE) 100 MG capsule Take 200 mg by mouth at bedtime.   . ferrous sulfate (FERROUSUL) 325 (65 FE) MG tablet Take 1 tablet (325 mg total) by mouth 3 (three) times daily with meals.  . finasteride (PROSCAR) 5 MG tablet Take 5 mg by mouth at bedtime.  . furosemide (LASIX) 40 MG tablet Take 40 mg by mouth every morning.   . gabapentin (NEURONTIN) 300 MG capsule Take 300 mg by mouth 3 (three) times daily.    Marland Kitchen HYDROcodone-acetaminophen (NORCO) 7.5-325 MG tablet Take 1-2 tablets by mouth every 4 (four) hours as needed for moderate pain.  Marland Kitchen insulin detemir (LEVEMIR) 100 UNIT/ML injection Inject 25 Units into the skin at bedtime.   Marland Kitchen labetalol (NORMODYNE) 300 MG tablet Take 300 mg by mouth 2 (two) times daily.  Marland Kitchen losartan (COZAAR) 100 MG tablet TAKE 1 TABLET BY MOUTH DAILY  . methocarbamol (ROBAXIN) 500 MG tablet Take 1 tablet (500 mg total) by mouth every 6 (six) hours as needed for muscle spasms.  . Omega-3 Fatty Acids (FISH OIL) 1200 MG CAPS Take 1,200 mg by mouth 2 (two) times daily.  . polyethylene glycol powder (GLYCOLAX/MIRALAX) powder TAKE 17MG (1CAPFUL) BY MOUTH(MIXED WITH WATER/JUICE) DAILY (Patient taking differently: TAKE 17MG (1CAPFUL) BY MOUTH(MIXED WITH WATER/JUICE) EVERY EVENING.)  . rosuvastatin (CRESTOR) 10 MG tablet Take 10 mg by mouth every morning.   . [DISCONTINUED] diphenhydramine-acetaminophen (TYLENOL PM) 25-500 MG TABS Take 1 tablet by mouth at bedtime as needed.    . [DISCONTINUED] oxyCODONE-acetaminophen (PERCOCET/ROXICET) 5-325 MG tablet Take 1 tablet by mouth every 6 (six) hours as needed.  . [DISCONTINUED] potassium chloride (K-DUR) 10 MEQ tablet Take 2 tablets (20 mEq total) by mouth daily.  .  [DISCONTINUED] traMADol (ULTRAM) 50 MG tablet Take 50 mg by mouth every 6 (six) hours as needed.   No facility-administered encounter medications on file as of 12/24/2019.  : Review of Systems:  Out of a complete 14 point review of systems, all are reviewed and negative with the exception of these symptoms as listed below:  Review of Systems  Neurological:       Pt presents today to discuss his tremors. Pt has tremors in both of his hands but it is worse in his right. His tremors started a year ago and have gotten progressively worse. Pt is right handed.    Objective:  Neurological Exam  Physical Exam Physical Examination:   Vitals:   12/24/19 0959  BP: (!) 145/83  Pulse: 74  Temp: (!) 97.5 F (36.4 C)    General Examination: The patient is a very pleasant 74 y.o. male in no acute distress. He appears well-developed and well-nourished and well groomed.   HEENT: Normocephalic, atraumatic, pupils are equal, round and reactive to light.  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  He is status post cataract extractions.  He has no facial masking, he has increase in neck kyphosis, limited neck mobility, no evidence of cervical dystonia, he has an intermittent head tremor,  particularly side to side, no lip or jaw tremor.  No voice tremor.  No facial dyskinesias.  Speech is clear with no dysarthria noted. There is no hypophonia. There is no lip, neck/head, jaw or voice tremor. Neck is supple with full range of passive and active motion. There are no carotid bruits on auscultation. Oropharynx exam reveals: moderate mouth dryness, adequate dental hygiene. Tongue protrudes centrally and palate elevates symmetrically.   Chest: Clear to auscultation without wheezing, rhonchi or crackles noted.  Heart: S1+S2+0, regular with questionable slight systolic murmur.   Abdomen: Soft, non-tender and non-distended  with normal bowel sounds appreciated on auscultation.  Extremities: There is trace pitting edema in the distal lower extremities bilaterally. Pedal pulses are intact.  Skin: Warm and dry without trophic changes noted.  Musculoskeletal: exam reveals no obvious joint deformities, tenderness or joint swelling or erythema.   Neurologically:  Mental status: The patient is awake, alert and oriented in all 4 spheres. His immediate and remote memory, attention, language skills and fund of knowledge are appropriate. There is no evidence of aphasia, agnosia, apraxia or anomia. Speech is clear with normal prosody and enunciation. Thought process is linear. Mood is normal and affect is normal.  Cranial nerves II - XII are as described above under HEENT exam. In addition: shoulder shrug is normal with equal shoulder height noted. On 12/24/2019: On Archimedes spiral drawing he has moderate trembling with the right hand, significant insecurity but no obvious trembling with the left hand, handwriting is large, tremulous, legible, not micrographic. Motor exam: Normal bulk, strength and tone is noted. There is no drift, or rebound. He has a slight intermittent resting tremor in the right more than left upper extremity, he has a moderate postural tremor in the right upper extremity, milder in the left upper extremity, mild to moderate action tremor in the right and left upper extremities, no intention tremor.  No lower extremity tremors.  Reflexes are 1+ in the UEs and trace in the LEs.  Fine motor skills and coordination: intact with normal finger taps, normal hand movements, normal rapid alternating patting, normal foot taps and normal foot agility.  Cerebellar testing: No dysmetria or intention tremor on finger to nose testing. Heel to shin is unremarkable bilaterally. There is no truncal or gait ataxia.  Sensory exam: intact to light touch in the upper and lower extremities.  Gait, station and balance: He stands  with mild difficulty, has a slight upper body tilt to the right, walks slowly, no shuffling, no freezing, preserved arm swing is noted.   Assessment and Plan:  In summary, [name] is a very pleasant 74 y.o.-year old male with an underlying complex medical history of gout, arthritis, degenerative spine disease, coronary artery disease, carotid artery disease, obstructive sleep apnea, diabetes, osteoarthritis, hypertension, hyperlipidemia, hearing loss, prostate cancer, history of bladder cancer, chronic pain, on chronic narcotic pain medication, and obesity, who presents for evaluation of his hand tremor of several years duration, worse in the past year, worse on the right side.  His history and examination are supportive of essential tremor.  He reports a family history of tremor affecting his older brother.  I talked to the patient at length today.  Unfortunately, symptomatic treatment for tremor is limited and often difficult.  He is already on a high-dose beta-blocker, I would not recommend adding a second beta-blocker.  He is already on multiple medications including gabapentin which can help with tremor control sometimes.  I talked to him about  the limited symptomatic treatment options and the concern for medication interaction and side effects, particularly increase in sedation and balance problems.  I would not favor adding yet another medication.  I reassured him that I did not see any signs of parkinsonism.  He reports that he has learned to live with a tremor.  He is particularly bothered by his chronic neck pain.  Unfortunately, a recent neck injection was not very successful.  He is encouraged to continue with his scheduled follow-up with his pain specialist.  He is advised to stay well rested and well-hydrated and limit his caffeine intake which may help reduce the tremor on a day-to-day basis just a little bit.  Unfortunately, there is not a whole lot I can offer him.  He is advised to  follow-up with his primary care physician and specialists as scheduled.  I noticed a possible slight systolic murmur.  He has not seen his cardiologist recently, he is encouraged to make a follow-up appointment.  I answered all his questions today and he was in agreement.  Thank you very much for allowing me to participate in the care of this nice patient. If I can be of any further assistance to you please do not hesitate to call me at [phone].  Sincerely,   Ricky Age, MD, PhD

## 2019-12-24 NOTE — Patient Instructions (Addendum)
You have a hand tremor and to a lesser degree a head tremor; also a family history of tremor in your older brother. Most likely, you have what's called essential tremor.    I do not see any signs or symptoms of parkinson's like disease or what we call parkinsonism.   For your tremor, I would not recommend any new medication for fear of side effects (especially sleepiness, balance problem) or medication interactions, especially in light of your current list of medication. As discussed, you are already taking a beta blocker.  Unfortunately, there is not much I can offer you.  Please try to hydrate well with water, about 6 to 8 cups of water per day, 8 ounce each are recommended.  Please try to limit your caffeine intake as caffeine can trigger the tremor to be worse.  Try to get enough rest at night, 7 to 8 hours of sleep are recommended.  We do not have to make a follow-up appointment.  Please remember, that any kind of tremor may be exacerbated by anxiety, anger, nervousness, excitement, dehydration, sleep deprivation, by caffeine, and low blood sugar values or blood sugar fluctuations and thyroid dysfunction; please make sure you have a regular check up with your primary care.

## 2020-02-25 ENCOUNTER — Telehealth: Payer: Self-pay | Admitting: Pulmonary Disease

## 2020-02-25 DIAGNOSIS — G4733 Obstructive sleep apnea (adult) (pediatric): Secondary | ICD-10-CM

## 2020-02-25 NOTE — Telephone Encounter (Signed)
Marita Kansas is not listed on the pt's DPR. ATC her but the call would not connect.  ATC pt, I have left a message for him or his wife to return our call.

## 2020-02-26 NOTE — Telephone Encounter (Signed)
Spoke with the pt's daughter  Pt last seen Dec 2020 and advised f/u in 3 months  His CPAP is not working  Currently using [facility], but gets supplies from [facility] and wants to get new machine from there  Wants to switch to Dr Elsworth Soho in [place] clinic only due to location is closer to home  I have scheduled him appt for 03/31/20  Can we send order for CPAP? Thanks

## 2020-02-27 NOTE — Telephone Encounter (Signed)
Absolutely yes

## 2020-02-29 NOTE — Telephone Encounter (Signed)
Called [place]. Someone answered but did not say anything. Will call back later.

## 2020-03-01 NOTE — Addendum Note (Signed)
Addended by: Desmond Dike C on: 03/01/2020 12:17 PM   Modules accepted: Orders

## 2020-03-01 NOTE — Telephone Encounter (Signed)
Spoke with pt's daughter, Marita Kansas. She is aware that Dr. Ander Slade is okay with ordering new CPAP. Order has been placed. Nothing further was needed.

## 2020-03-02 ENCOUNTER — Encounter: Payer: Self-pay | Admitting: *Deleted

## 2020-03-02 ENCOUNTER — Ambulatory Visit: Payer: Medicare HMO | Admitting: Cardiovascular Disease

## 2020-03-02 ENCOUNTER — Ambulatory Visit: Payer: Medicare HMO | Admitting: Family Medicine

## 2020-03-02 ENCOUNTER — Encounter: Payer: Self-pay | Admitting: Family Medicine

## 2020-03-02 VITALS — BP 154/70 | HR 74 | Ht 69.0 in | Wt 200.6 lb

## 2020-03-02 DIAGNOSIS — I2510 Atherosclerotic heart disease of native coronary artery without angina pectoris: Secondary | ICD-10-CM | POA: Diagnosis not present

## 2020-03-02 DIAGNOSIS — E782 Mixed hyperlipidemia: Secondary | ICD-10-CM | POA: Diagnosis not present

## 2020-03-02 DIAGNOSIS — I10 Essential (primary) hypertension: Secondary | ICD-10-CM | POA: Diagnosis not present

## 2020-03-02 DIAGNOSIS — I779 Disorder of arteries and arterioles, unspecified: Secondary | ICD-10-CM | POA: Diagnosis not present

## 2020-03-02 NOTE — Patient Instructions (Signed)
Medication Instructions:   Your physician recommends that you continue on your current medications as directed. Please refer to the Current Medication list given to you today.  Labwork:  NONE  Testing/Procedures:  NONE  Follow-Up:  Your physician recommends that you schedule a follow-up appointment in: 1 year (office). You will receive a call in about 10 months reminding you to call and schedule your appointment. If you don't receive this call, please contact our office.  Any Other Special Instructions Will Be Listed Below (If Applicable).  If you need a refill on your cardiac medications before your next appointment, please call your pharmacy.

## 2020-03-02 NOTE — Progress Notes (Addendum)
Cardiology Office Note  Date: 03/02/2020   ID: Goodwin, [place] Jan 29, 1946, MRN [id number]  PCP:  [name], MD  Cardiologist:  Carlyle Dolly, MD Electrophysiologist:  None   Chief Complaint: F/U HTN  History of Present Illness: [name] is a 74 y.o. male with a history of hypertension, carotid artery stenosis, OSA, HLD, prostate CA, CAD  Last saw Dr. Harl Bowie on 02/17/2018.  He was compliant with antihypertensive medications.  Carotid artery stenosis last study showed RICA greater than 62%, LICA less than 50.  On aspirin and cerebrovascular disease followed by vascular.  No recent neuro symptoms.  Was compliant with his CPAP.  Nuclear stress test 06/06/2017 possible mild inferior septal ischemia versus artifact, low risk.  Previous AAA screen negative 2015 via imaging.  Was being considered for knee replacement.  He is here today for 1 year follow-up.  He denies any issues from a cardiac standpoint he denies any progressive anginal or exertional symptoms, palpitations or arrhythmias, orthostatic symptoms, stroke or TIA-like symptoms, PND, orthopnea, bleeding, claudication-like symptoms, lower extremity edema.  He states his primary problems recently have been related to his neck and back.  He has chronic neck pain and was told by by a surgeon recently that nothing could be done for his neck from a surgical standpoint.  Patient has had multiple orthopedic surgeries in the past.  Has abnormal anatomy and has carotid arteries with greater than 70% stenosis and right ICA.  Less than 22% in LICA.  Patient states this is another issue that surgery could not be performed unless he began having significant neurologic symptoms.  He has chronic pain mostly originating in his neck but has had multiple surgeries on his back.  States he has been under a lot of stress lately due to his wife being sick and he has to perform most activities at home including taking care of his wife.   Past Medical  History:  Diagnosis Date  . Bilateral renal cysts   . Bladder cancer ([place]) UROLOGIST-  DR [facility]   RECURRENT 11/ 2017 --  hx turbt in 2004 and 08-19-2013  . BPH without obstruction/lower urinary tract symptoms   . Cervical fusion syndrome    LEFT ARM NUMBNESS--  CONTROLLED W/ GABAPENTIN  . Coronary artery disease    CARDIOLOGIST-- DR BRANCH [name])  . DDD (degenerative disc disease), cervical   . DDD (degenerative disc disease), lumbar   . Diverticulosis of sigmoid colon   . History of acute gouty arthritis   . History of adenomatous polyp of colon    2001;  2012- non-malignant leiomyoma;  04-07-2014  tubualr adenoma and hyperplastic polyp's  . History of prostate cancer UROLOGIST-  DR [facility]   S/P  RADIOACTIVE SEED IMPLANTS 2004  . HOH (hard of hearing)    no hearing aids  . Hyperlipidemia   . Hypertension   . OA (osteoarthritis)   . Occlusion and stenosis of carotid artery without mention of cerebral infarction CARDIOLOGIST  -- DR  [name]   LAST DUPLEX  11-23-2015  RICA  (DUPLEX DOPPLER 01-20-2016 RICA 97-98% PROXIMAL) /   LICA <92% (HAD CONSULT W/ [name] , VASCULAR SURGEON 01-20-2016)  . OSA on CPAP    BiPAP  . Spondylosis, cervical   . Type 2 diabetes mellitus ([place])   . Wears glasses     Past Surgical History:  Procedure Laterality Date  . ANTERIOR REMOVAL CAGE AND PLATE C3-C7/ CORPECTOMY C7 (FX)/  ALLOGRAFT AND FUSION C3 -- T1/  POSTERIOR DECOMPRESSION BILATERAL LAMINECTOMY C4 -- 6 & PARTIAL C3/  POSTEROLATERAL ARTHRODESIS C3 - T1  06/05/2005  . APPENDECTOMY  1990'S  . CATARACT EXTRACTION W/ INTRAOCULAR LENS  IMPLANT, BILATERAL  2013  . CERVICAL FUSION  05/11/2005   C3  --  C7/  due to post op quadriparesis same day s/p  anterior C 4,5,6, colpectomy, decompression, removal epidural hematoma, foraminnotomy, cage and plate  . COLONOSCOPY  last one 04-07-2014  . CYSTOSCOPY W/ RETROGRADES Bilateral 08/19/2013   Procedure: CYSTOSCOPY WITH RETROGRADE  PYELOGRAM;  Surgeon: Molli Hazard, MD;  [facility];  Service: Urology;  Laterality: Bilateral;  . CYSTOSCOPY W/ RETROGRADES Bilateral 07/18/2016   Procedure: CYSTOSCOPY WITH RETROGRADE PYELOGRAM;  Surgeon: Alexis Frock, MD;  Location[facility];  Service: Urology;  Laterality: Bilateral;  . ENDOSCOPIC REPAIR CSF LEAK VIA NASAL PASSAGE  2011  . INTRAOPERATIVE ARTERIOGRAM  CATH LAB  01-05-2009  DR Einar Gip   RICA   ACUTE ANGLE 80-85% STENOSIS  . KNEE ARTHROSCOPY Left 07/15/2017   Procedure: ARTHROSCOPY LEFT  KNEE AND DEBRIDEMENT, medial meniscal tear and chondromalaita;  Surgeon: Paralee Cancel, MD;  Location: [facility];  Service: Orthopedics;  Laterality: Left;  60 MINS  . LUMBAR FUSION  03-26-2012   L3 --  L5  . RADIOACTIVE PROSTATE SEED IMPLANTS  08-19-2003  . SEPTOPLASTY  1998  . TOTAL KNEE ARTHROPLASTY Left 02/25/2018   Procedure: LEFT TOTAL KNEE ARTHROPLASTY;  Surgeon: Paralee Cancel, MD;  Location: [facility];  Service: Orthopedics;  Laterality: Left;  70 mins  . TRANSURETHRAL RESECTION OF BLADDER TUMOR N/A 08/19/2013   Procedure: TRANSURETHRAL RESECTION OF BLADDER TUMOR (TURBT);  Surgeon: Molli Hazard, MD;  [facility];  Service: Urology;  Laterality: N/A;  . TRANSURETHRAL RESECTION OF BLADDER TUMOR N/A 07/18/2016   Procedure: TRANSURETHRAL RESECTION OF BLADDER TUMOR (TURBT);  Surgeon: Alexis Frock, MD;  Location: [facility];  Service: Urology;  Laterality: N/A;  . YAG LASER APPLICATION  16/06/9603   Procedure: YAG LASER APPLICATION;  Surgeon: Elta Guadeloupe T. Gershon Crane, MD;  Location: [facility];  Service: Ophthalmology;  Laterality: Right;    Current Outpatient Medications  Medication Sig Dispense Refill  . allopurinol (ZYLOPRIM) 300 MG tablet Take 300 mg by mouth at bedtime.     Marland Kitchen amLODipine (NORVASC) 10 MG tablet TAKE 1 TABLET BY MOUTH EVERY DAY 90 tablet 3  . aspirin 81 MG EC tablet Take 81 mg by mouth at  bedtime.     . celecoxib (CELEBREX) 200 MG capsule Take 200 mg by mouth 2 (two) times daily.     . clopidogrel (PLAVIX) 75 MG tablet Take 75 mg by mouth daily.   6  . colchicine 0.6 MG tablet Take 0.6 mg by mouth 2 (two) times daily as needed (gout).     . cyclobenzaprine (FLEXERIL) 10 MG tablet Take 10 mg by mouth 3 (three) times daily as needed.    . docusate sodium (COLACE) 100 MG capsule Take 200 mg by mouth at bedtime.     . ferrous sulfate (FERROUSUL) 325 (65 FE) MG tablet Take 1 tablet (325 mg total) by mouth 3 (three) times daily with meals.  3  . finasteride (PROSCAR) 5 MG tablet Take 5 mg by mouth at bedtime.    . furosemide (LASIX) 40 MG tablet Take 40 mg by mouth every morning.     . gabapentin (NEURONTIN) 300 MG capsule Take 300  mg by mouth 3 (three) times daily.      Marland Kitchen HYDROcodone-acetaminophen (NORCO) 7.5-325 MG tablet Take 1-2 tablets by mouth every 4 (four) hours as needed for moderate pain. 60 tablet 0  . insulin detemir (LEVEMIR) 100 UNIT/ML injection Inject 25 Units into the skin at bedtime.     Marland Kitchen labetalol (NORMODYNE) 300 MG tablet Take 300 mg by mouth 2 (two) times daily.    Marland Kitchen losartan (COZAAR) 100 MG tablet TAKE 1 TABLET BY MOUTH DAILY 30 tablet 6  . methocarbamol (ROBAXIN) 500 MG tablet Take 1 tablet (500 mg total) by mouth every 6 (six) hours as needed for muscle spasms. 40 tablet 0  . Omega-3 Fatty Acids (FISH OIL) 1200 MG CAPS Take 1,200 mg by mouth 2 (two) times daily.    . polyethylene glycol powder (GLYCOLAX/MIRALAX) powder TAKE 17MG (1CAPFUL) BY MOUTH(MIXED WITH WATER/JUICE) DAILY (Patient taking differently: TAKE 17MG (1CAPFUL) BY MOUTH(MIXED WITH WATER/JUICE) EVERY EVENING.) 527 g 0  . rosuvastatin (CRESTOR) 10 MG tablet Take 10 mg by mouth every morning.      No current facility-administered medications for this visit.   Allergies:  Dilaudid [hydromorphone hcl], Toradol [ketorolac tromethamine], and Lyrica [pregabalin]   Social History: The patient  reports that  he has been smoking cigarettes. He has a 47.00 pack-year smoking history. He has never used smokeless tobacco. He reports that he does not drink alcohol and does not use drugs.   Family History: The patient's family history includes Arthritis in an other family member; Breast cancer in his mother; Cancer in his sister; Colon cancer in his brother; Diabetes in his brother, mother, and sister; Heart disease in his father.   ROS:  Please see the history of present illness. Otherwise, complete review of systems is positive for none.  All other systems are reviewed and negative.   Physical Exam: VS:  BP (!) 154/70   Pulse 74   Ht 5\' 9"  (1.753 m)   Wt 200 lb 4.8 oz (90.9 kg)   SpO2 98%   BMI 29.58 kg/m , BMI Body mass index is 29.58 kg/m.  Wt Readings from Last 3 Encounters:  03/02/20 200 lb 4.8 oz (90.9 kg)  12/24/19 205 lb (93 kg)  10/21/19 209 lb (94.8 kg)    General: Patient appears comfortable at rest. Neck: Supple, no elevated JVP or carotid bruits, no thyromegaly. Lungs: Clear to auscultation, nonlabored breathing at rest. Cardiac: Regular rate and rhythm, no S3 or significant systolic murmur, no pericardial rub. Extremities: No pitting edema, distal pulses 2+. Skin: Warm and dry. Musculoskeletal: No kyphosis. Neuropsychiatric: Alert and oriented x3, affect grossly appropriate.  ECG:  An ECG dated 03/02/2020 was personally reviewed today and demonstrated:  Normal sinus rhythm rate of 74.  Minimal voltage criteria for LVH, may be normal variant  Recent Labwork: Recent lab work from Dr. Juel Burrow office drawn on 10/09/2019: CBC unremarkable.  Complete metabolic panel unremarkable except for glucose 171 creatinine 0.94 GFR 80.  Hemoglobin A1c 6.8%.  Lab work will be scanned into the system for review.  No results found for requested labs within last 8760 hours.  No results found for: CHOL, TRIG, HDL, CHOLHDL, VLDL, LDLCALC, LDLDIRECT  Other Studies Reviewed Today: 05/2017 nuclear  stress  No diagnostic ST segment changes indicating ischemia. No arrhythmias.  Moderate-sized, mild intensity, inferior and inferoseptal defects that are largely fixed with a partial region of reversibility in the mid inferoseptal zone. Normal wall motion in this area argues against scar. Although diaphragmatic attenuation is  likely playing a role, consider a small region of inferoseptal ischemia.  This is a low risk study.  Nuclear stress EF: 64%.  Assessment and Plan:  1. Essential hypertension   2. Carotid artery disease without cerebral infarction ([place])   3. Mixed hyperlipidemia   4. CAD in native artery    1. Essential hypertension Blood pressures elevated today with systolic of 696.  Patient states he usually stays in chronic pain which may be the reason for elevated blood pressure.  Continue amlodipine 10 mg daily, labetalol 300 mg p.o. twice daily, losartan 100 mg daily.  2. Carotid artery disease without cerebral infarction (H[facility]) Greater than 70% right ICA stenosis and less than 50% stenosis and left ICA.  Patient states surgery and has told him in the past unless he begins to have significant neurological symptoms would not perform carotid artery surgery.  Denies any focal neurologic deficits, CVA or TIA-like symptoms, vision issues, dizziness.  Continue Plavix 75 mg daily.  3. Mixed hyperlipidemia Patient states he recently had lab work at PCP office.  We will attempt to obtain those results.  Continue Crestor 10 mg daily.  Recent lipid panel showed total cholesterol 135, triglycerides 320, HDL 35, LDL 51.  4. CAD in native artery Incidental findings from prior chest CT 2013.  Nuclear stress test 2018 possible mild inferior septal ischemia versus artifact, low risk.  No anginal or exertional symptoms or recent SOB or DOE.  Continue aspirin 81 mg.  Medication Adjustments/Labs and Tests Ordered: Current medicines are reviewed at length with the patient today.  Concerns  regarding medicines are outlined above.   Disposition: Follow-up with Dr. Harl Bowie or APP 1 year Signed, Levell July, NP 03/02/2020 9:18 AM    [facility] [place], [place], [place] [address] Phone: [phone]; Fax: [phone]

## 2020-03-09 ENCOUNTER — Telehealth: Payer: Self-pay | Admitting: Pulmonary Disease

## 2020-03-09 NOTE — Telephone Encounter (Signed)
Called and spoke with Rod Holler patient's wife to let her know that everything should now be squared away with [employer] and that they should be hearing something from them soon to get him a machine. Patient provided with [employer] customer service number [phone].  She expressed understanding. Nothing further needed at this time.

## 2020-03-31 ENCOUNTER — Other Ambulatory Visit: Payer: Self-pay

## 2020-03-31 ENCOUNTER — Encounter: Payer: Self-pay | Admitting: Pulmonary Disease

## 2020-03-31 ENCOUNTER — Ambulatory Visit: Payer: Medicare HMO | Admitting: Pulmonary Disease

## 2020-03-31 DIAGNOSIS — G4733 Obstructive sleep apnea (adult) (pediatric): Secondary | ICD-10-CM

## 2020-03-31 DIAGNOSIS — Z720 Tobacco use: Secondary | ICD-10-CM | POA: Diagnosis not present

## 2020-03-31 NOTE — Progress Notes (Signed)
   Subjective:    Patient ID: [name], male    DOB: 1946-03-22, 74 y.o.   MRN: [id number]  HPI 74 year old smoker presents to establish care for OSA  PMH -diabetes, prostate cancer ,tremors Diagnosed with moderate obstructive sleep apnea in 2003 Currently on BiPAP of 14/11  He was last seen 08/2019 by my partner Dr. Gala Murdoch , prescription was sent for new BiPAP since his machine was dated , he wanted to change DME from [place] to new 1 and this was arranged He has lost weight from 213 pounds in 2018 to 200 now  DME - [facility]  [facility]  His wife was just diagnosed with lung cancer and we discussed her condition.  He continues to smoke, denies dyspnea, wheezing or frequent colds, no PFTs available  Significant tests/ events reviewed  NPSG 2003 >AHI 16/hr   Review of Systems Patient denies significant dyspnea,cough, hemoptysis,  chest pain, palpitations, pedal edema, orthopnea, paroxysmal nocturnal dyspnea, lightheadedness, nausea, vomiting, abdominal or  leg pains      Objective:   Physical Exam  Gen. Pleasant, obese, in no distress ENT - no lesions, no post nasal drip Neck: No JVD, no thyromegaly, no carotid bruits Lungs: no use of accessory muscles, no dullness to percussion, decreased without rales or rhonchi  Cardiovascular: Rhythm regular, heart sounds  normal, no murmurs or gallops, 1+ peripheral edema Musculoskeletal: No deformities, no cyanosis or clubbing ,  Tremors +        Assessment & Plan:

## 2020-03-31 NOTE — Assessment & Plan Note (Signed)
Prescription for BiPAP has been sent to DME 14/11 with nasal mask. He will pick this up today. We will check download in 1 month and fine-tune settings if needed  Weight loss encouraged, compliance with goal of at least 4-6 hrs every night is the expectation. Advised against medications with sedative side effects Cautioned against driving when sleepy - understanding that sleepiness will vary on a day to day basis

## 2020-03-31 NOTE — Assessment & Plan Note (Signed)
He is not ready to make a quit attempt. Does not have any breathing issues will defer spirometry for now. He would be willing to enroll in a lung cancer screening program and we will make the referral

## 2020-03-31 NOTE — Patient Instructions (Signed)
Rx for biPAP 14/11 has been sent Refer to lung CA screening program

## 2020-07-12 ENCOUNTER — Telehealth: Payer: Self-pay | Admitting: Pulmonary Disease

## 2020-07-12 NOTE — Telephone Encounter (Signed)
Called Brad at [facility].  He is going to have someone in local [employer] office RT team to call pt to make sure he has supplies and check with him on his mask.  He states pt may need a fitting but he will have them to call him.  I called pt & left him a vm just to let him know someone from [place] will be calling him this afternoon or in the morning and left him my phone # and [employer] customer service #.  Nothing further needed on this message.

## 2020-09-22 ENCOUNTER — Ambulatory Visit: Payer: Medicare HMO | Admitting: Cardiology

## 2020-09-22 ENCOUNTER — Other Ambulatory Visit: Payer: Self-pay

## 2020-09-22 ENCOUNTER — Encounter: Payer: Self-pay | Admitting: *Deleted

## 2020-09-22 ENCOUNTER — Encounter: Payer: Self-pay | Admitting: Cardiology

## 2020-09-22 VITALS — BP 152/76 | HR 79 | Resp 18 | Ht 68.0 in | Wt 196.2 lb

## 2020-09-22 DIAGNOSIS — I10 Essential (primary) hypertension: Secondary | ICD-10-CM | POA: Diagnosis not present

## 2020-09-22 DIAGNOSIS — I779 Disorder of arteries and arterioles, unspecified: Secondary | ICD-10-CM

## 2020-09-22 DIAGNOSIS — E782 Mixed hyperlipidemia: Secondary | ICD-10-CM

## 2020-09-22 DIAGNOSIS — I2510 Atherosclerotic heart disease of native coronary artery without angina pectoris: Secondary | ICD-10-CM

## 2020-09-22 MED ORDER — LOSARTAN POTASSIUM 100 MG PO TABS: 100.0000 mg | ORAL_TABLET | Freq: Every evening | ORAL | 6 refills | Status: DC

## 2020-09-22 NOTE — Progress Notes (Signed)
Clinical Summary [name] is a 75 y.o.male seen today for follow up of the following medical problems.   1. HTN - he reports bp's are up and down. At home 120s/60s in afternoon, occasional low bp's.  - low bp's recently on chlorthalidone, now off.  - often low bp's in mid day, SBPs to 90s that can be symptomatic.     2. Carotid stenosis - RICA >70%, LICA <50%.  - has been on ASA and plavix, presume due to his history cerebrovascular disease - followed by vascular. He wants to follow here for serial imaging.   - no recent symptoms, overdue for imaging.     3. Hyperlipidemia - labs followed by pcp, he is on statin  4. OSA - compliant with CPAP  5. Prostate CA - history of prior seed implant, reports cancer free  6. CAD - incidental findings form prior chest CT in 2013. Do not see a previous functional assessment for ischemia. The patient has not had symptoms of angina - 05/2017 nuclear stress: possible mild inferoseptal ischemia vs artifact, low risk  - no regular chest pains, no exertional symptoms.   7. AAA screen - male over 5 former smoker - 02/2014 CT showed no aneurysm     Past Medical History:  Diagnosis Date  . Bilateral renal cysts   . Bladder cancer (HCC) UROLOGIST-  DR [facility]   RECURRENT 11/ 2017 --  hx turbt in 2004 and 08-19-2013  . BPH without obstruction/lower urinary tract symptoms   . Cervical fusion syndrome    LEFT ARM NUMBNESS--  CONTROLLED W/ GABAPENTIN  . Coronary artery disease    CARDIOLOGIST-- DR Angelyn Osterberg Lorelle Form[place])  . DDD (degenerative disc disease), cervical   . DDD (degenerative disc disease), lumbar   . Diverticulosis of sigmoid colon   . History of acute gouty arthritis   . History of adenomatous polyp of colon    2001;  2012- non-malignant leiomyoma;  04-07-2014  tubualr adenoma and hyperplastic polyp's  . History of prostate cancer UROLOGIST-  DR [facility]   S/P  RADIOACTIVE SEED IMPLANTS 2004  . HOH (hard of  hearing)    no hearing aids  . Hyperlipidemia   . Hypertension   . OA (osteoarthritis)   . Occlusion and stenosis of carotid artery without mention of cerebral infarction CARDIOLOGIST  -- DR  Christiane Ha Eain Mullendore   LAST DUPLEX  11-23-2015  RICA  (DUPLEX DOPPLER 01-20-2016 RICA 60-79% PROXIMAL) /   LICA <50% (HAD CONSULT W/ DR Imogene Burn , VASCULAR SURGEON 01-20-2016)  . OSA on CPAP    BiPAP  . Spondylosis, cervical   . Type 2 diabetes mellitus (HCC)   . Wears glasses      Allergies  Allergen Reactions  . Dilaudid [Hydromorphone Hcl] Other (See Comments)    "acts a little off"  . Toradol [Ketorolac Tromethamine] Other (See Comments)    CauseD  hallucination ?-states not sure/ avoids per his MD  . Lyrica [Pregabalin] Nausea Only and Other (See Comments)    Make me feel "bad"     Current Outpatient Medications  Medication Sig Dispense Refill  . allopurinol (ZYLOPRIM) 300 MG tablet Take 300 mg by mouth at bedtime.     Marland Kitchen amLODipine (NORVASC) 10 MG tablet TAKE 1 TABLET BY MOUTH EVERY DAY 90 tablet 3  . aspirin 81 MG EC tablet Take 81 mg by mouth at bedtime.     . celecoxib (CELEBREX) 200 MG capsule Take 200 mg by  mouth 2 (two) times daily.     . clopidogrel (PLAVIX) 75 MG tablet Take 75 mg by mouth daily.   6  . colchicine 0.6 MG tablet Take 0.6 mg by mouth 2 (two) times daily as needed (gout).     . cyclobenzaprine (FLEXERIL) 10 MG tablet Take 10 mg by mouth 3 (three) times daily as needed.    . docusate sodium (COLACE) 100 MG capsule Take 200 mg by mouth at bedtime.     . ferrous sulfate (FERROUSUL) 325 (65 FE) MG tablet Take 1 tablet (325 mg total) by mouth 3 (three) times daily with meals.  3  . finasteride (PROSCAR) 5 MG tablet Take 5 mg by mouth at bedtime.    . furosemide (LASIX) 40 MG tablet Take 40 mg by mouth every morning.     . gabapentin (NEURONTIN) 300 MG capsule Take 300 mg by mouth 3 (three) times daily.      Marland Kitchen HYDROcodone-acetaminophen (NORCO) 7.5-325 MG tablet Take 1-2  tablets by mouth every 4 (four) hours as needed for moderate pain. 60 tablet 0  . insulin detemir (LEVEMIR) 100 UNIT/ML injection Inject 25 Units into the skin at bedtime.     Marland Kitchen labetalol (NORMODYNE) 300 MG tablet Take 300 mg by mouth 2 (two) times daily.    Marland Kitchen losartan (COZAAR) 100 MG tablet TAKE 1 TABLET BY MOUTH DAILY 30 tablet 6  . methocarbamol (ROBAXIN) 500 MG tablet Take 1 tablet (500 mg total) by mouth every 6 (six) hours as needed for muscle spasms. 40 tablet 0  . Omega-3 Fatty Acids (FISH OIL) 1200 MG CAPS Take 1,200 mg by mouth 2 (two) times daily.    . polyethylene glycol powder (GLYCOLAX/MIRALAX) powder TAKE 17MG (1CAPFUL) BY MOUTH(MIXED WITH WATER/JUICE) DAILY (Patient taking differently: TAKE 17MG (1CAPFUL) BY MOUTH(MIXED WITH WATER/JUICE) EVERY EVENING.) 527 g 0  . rosuvastatin (CRESTOR) 10 MG tablet Take 10 mg by mouth every morning.      No current facility-administered medications for this visit.     Past Surgical History:  Procedure Laterality Date  . ANTERIOR REMOVAL CAGE AND PLATE C3-C7/ CORPECTOMY C7 (FX)/  ALLOGRAFT AND FUSION C3 -- T1/  POSTERIOR DECOMPRESSION BILATERAL LAMINECTOMY C4 -- 6 & PARTIAL C3/  POSTEROLATERAL ARTHRODESIS C3 - T1  06/05/2005  . APPENDECTOMY  1990'S  . CATARACT EXTRACTION W/ INTRAOCULAR LENS  IMPLANT, BILATERAL  2013  . CERVICAL FUSION  05/11/2005   C3  --  C7/  due to post op quadriparesis same day s/p  anterior C 4,5,6, colpectomy, decompression, removal epidural hematoma, foraminnotomy, cage and plate  . COLONOSCOPY  last one 04-07-2014  . CYSTOSCOPY W/ RETROGRADES Bilateral 08/19/2013   Procedure: CYSTOSCOPY WITH RETROGRADE PYELOGRAM;  Surgeon: Molli Hazard, MD;  Location: [facility];  Service: Urology;  Laterality: Bilateral;  . CYSTOSCOPY W/ RETROGRADES Bilateral 07/18/2016   Procedure: CYSTOSCOPY WITH RETROGRADE PYELOGRAM;  Surgeon: Alexis Frock, MD;  Location: [facility];  Service: Urology;   Laterality: Bilateral;  . ENDOSCOPIC REPAIR CSF LEAK VIA NASAL PASSAGE  2011  . INTRAOPERATIVE ARTERIOGRAM  CATH LAB  01-05-2009  DR Einar Gip   RICA   ACUTE ANGLE 80-85% STENOSIS  . KNEE ARTHROSCOPY Left 07/15/2017   Procedure: ARTHROSCOPY LEFT  KNEE AND DEBRIDEMENT, medial meniscal tear and chondromalaita;  Surgeon: Paralee Cancel, MD;  Location: [facility];  Service: Orthopedics;  Laterality: Left;  60 MINS  . LUMBAR FUSION  03-26-2012   L3 --  L5  . RADIOACTIVE PROSTATE SEED  IMPLANTS  08-19-2003  . SEPTOPLASTY  1998  . TOTAL KNEE ARTHROPLASTY Left 02/25/2018   Procedure: LEFT TOTAL KNEE ARTHROPLASTY;  Surgeon: Durene Romans, MD;  Location: [facility];  Service: Orthopedics;  Laterality: Left;  70 mins  . TRANSURETHRAL RESECTION OF BLADDER TUMOR N/A 08/19/2013   Procedure: TRANSURETHRAL RESECTION OF BLADDER TUMOR (TURBT);  Surgeon: Milford Cage, MD;  Location: [facility];  Service: Urology;  Laterality: N/A;  . TRANSURETHRAL RESECTION OF BLADDER TUMOR N/A 07/18/2016   Procedure: TRANSURETHRAL RESECTION OF BLADDER TUMOR (TURBT);  Surgeon: Sebastian Ache, MD;  Location[facility];  Service: Urology;  Laterality: N/A;  . YAG LASER APPLICATION  07/29/2012   Procedure: YAG LASER APPLICATION;  Surgeon: Loraine Leriche T. Nile Riggs, MD;  Location: [facility];  Service: Ophthalmology;  Laterality: Right;     Allergies  Allergen Reactions  . Dilaudid [Hydromorphone Hcl] Other (See Comments)    "acts a little off"  . Toradol [Ketorolac Tromethamine] Other (See Comments)    CauseD  hallucination ?-states not sure/ avoids per his MD  . Lyrica [Pregabalin] Nausea Only and Other (See Comments)    Make me feel "bad"      Family History  Problem Relation Age of Onset  . Breast cancer Mother        mets  . Diabetes Mother   . Heart disease Father        MI  . Colon cancer Brother   . Cancer Sister        male organs  . Diabetes Sister        family hx  . Arthritis Other         entire family  . Diabetes Brother        x 2     Social History [name] reports that he has been smoking cigarettes. He has a 47.00 pack-year smoking history. He has never used smokeless tobacco. [name] reports no history of alcohol use.   Review of Systems CONSTITUTIONAL: No weight loss, fever, chills, weakness or fatigue.  HEENT: Eyes: No visual loss, blurred vision, double vision or yellow sclerae.No hearing loss, sneezing, congestion, runny nose or sore throat.  SKIN: No rash or itching.  CARDIOVASCULAR: per hpi RESPIRATORY: No shortness of breath, cough or sputum.  GASTROINTESTINAL: No anorexia, nausea, vomiting or diarrhea. No abdominal pain or blood.  GENITOURINARY: No burning on urination, no polyuria NEUROLOGICAL: No headache, dizziness, syncope, paralysis, ataxia, numbness or tingling in the extremities. No change in bowel or bladder control.  MUSCULOSKELETAL: No muscle, back pain, joint pain or stiffness.  LYMPHATICS: No enlarged nodes. No history of splenectomy.  PSYCHIATRIC: No history of depression or anxiety.  ENDOCRINOLOGIC: No reports of sweating, cold or heat intolerance. No polyuria or polydipsia.  Marland Kitchen   Physical Examination Today's Vitals   09/22/20 1344  BP: (!) 152/76  Pulse: 79  Resp: 18  SpO2: 99%  Weight: 196 lb 3.2 oz (89 kg)  Height: 5\' 8"  (1.727 m)   Body mass index is 29.83 kg/m.  Gen: resting comfortably, no acute distress HEENT: no scleral icterus, pupils equal round and reactive, no palptable cervical adenopathy,  CV: RRR, 2/6 systolic murmur rusb, no jvd Resp: Clear to auscultation bilaterally GI: abdomen is soft, non-tender, non-distended, normal bowel sounds, no hepatosplenomegaly MSK: extremities are warm, no edema.  Skin: warm, no rash Neuro:  no focal deficits Psych: appropriate affect   Diagnostic Studies 05/2017 nuclear stress  No diagnostic ST segment changes  indicating ischemia. No arrhythmias.  Moderate-sized,  mild intensity, inferior and inferoseptal defects that are largely fixed with a partial region of reversibility in the mid inferoseptal zone. Normal wall motion in this area argues against scar. Although diaphragmatic attenuation is likely playing a role, consider a small region of inferoseptal ischemia.  This is a low risk study.  Nuclear stress EF: 64%.     Assessment and Plan  1 . HTN - low bp's early afternoon, I think from taking all his bp meds in the AM. Will try taking his losartan in the evenings, perhaps spreading out the doses will limit the big drops he gets in the afternoons.   2. Carotid stenosis - repeat carotid US, no recent symptoms  3. Hyperlipidemia -request pcp labs, continue statin  4. CAD - incidental finding on CT scan, he has not had any cardiac symptoms -prior low risk stress test - continue current meds, no symptoms   F/u 6 months      Arnoldo Lenis, M.D.

## 2020-09-22 NOTE — Patient Instructions (Addendum)
Medication Instructions:   Your physician has recommended you make the following change in your medication:   Take losartan 100 mg daily in the evening versus morning  Continue other medications the same  Labwork:  None  Testing/Procedures: Your physician has requested that you have a carotid duplex. This test is an ultrasound of the carotid arteries in your neck. It looks at blood flow through these arteries that supply the brain with blood. Allow one hour for this exam. There are no restrictions or special instructions.  Follow-Up:  Your physician recommends that you schedule a follow-up appointment in: 6 months.  Any Other Special Instructions Will Be Listed Below (If Applicable).  If you need a refill on your cardiac medications before your next appointment, please call your pharmacy.

## 2020-10-13 ENCOUNTER — Other Ambulatory Visit: Payer: Self-pay | Admitting: Cardiology

## 2020-10-13 ENCOUNTER — Other Ambulatory Visit: Payer: Self-pay

## 2020-10-13 ENCOUNTER — Ambulatory Visit (INDEPENDENT_AMBULATORY_CARE_PROVIDER_SITE_OTHER): Payer: Medicare HMO

## 2020-10-13 DIAGNOSIS — I6523 Occlusion and stenosis of bilateral carotid arteries: Secondary | ICD-10-CM | POA: Diagnosis not present

## 2020-10-13 DIAGNOSIS — I779 Disorder of arteries and arterioles, unspecified: Secondary | ICD-10-CM | POA: Diagnosis not present

## 2020-10-14 ENCOUNTER — Telehealth: Payer: Self-pay | Admitting: *Deleted

## 2020-10-14 NOTE — Telephone Encounter (Signed)
-----   Message from Arnoldo Lenis, MD sent at 10/14/2020 10:54 AM EST ----- Carotid US shows moderate blockages on both sides,we will continue to monitor  Zandra Abts MD

## 2020-10-21 NOTE — Telephone Encounter (Signed)
Patient informed. Copy sent to PCP °

## 2021-03-27 ENCOUNTER — Other Ambulatory Visit: Payer: Self-pay

## 2021-03-27 ENCOUNTER — Encounter (HOSPITAL_COMMUNITY): Payer: Self-pay | Admitting: Physical Therapy

## 2021-03-27 ENCOUNTER — Ambulatory Visit (HOSPITAL_COMMUNITY): Payer: Medicare HMO | Attending: Orthopedic Surgery | Admitting: Physical Therapy

## 2021-03-27 DIAGNOSIS — M25552 Pain in left hip: Secondary | ICD-10-CM

## 2021-03-27 DIAGNOSIS — R2689 Other abnormalities of gait and mobility: Secondary | ICD-10-CM | POA: Diagnosis present

## 2021-03-27 DIAGNOSIS — R29898 Other symptoms and signs involving the musculoskeletal system: Secondary | ICD-10-CM

## 2021-03-27 DIAGNOSIS — M6281 Muscle weakness (generalized): Secondary | ICD-10-CM | POA: Diagnosis present

## 2021-03-27 NOTE — Therapy (Signed)
[name], [place], [address] Phone: [phone]   Fax:  [phone]  Physical Therapy Evaluation  Patient Details  Name: [name] MRN: [id number] Date of Birth: 1946-02-24 Referring Provider (PT): Rod Can MD   Encounter Date: 03/27/2021   PT End of Session - 03/27/21 0904     Visit Number 1    Number of Visits 12    Date for PT Re-Evaluation 05/08/21    Authorization Type Aetna Medicare (no visit limit)    Progress Note Due on Visit 10    PT Start Time 0830    PT Stop Time 0902    PT Time Calculation (min) 32 min    Activity Tolerance Patient tolerated treatment well    Behavior During Therapy [facility] for tasks assessed/performed             Past Medical History:  Diagnosis Date   Bilateral renal cysts    Bladder cancer ([place]) UROLOGIST-  DR [facility]   RECURRENT 11/ 2017 --  hx turbt in 2004 and 08-19-2013   BPH without obstruction/lower urinary tract symptoms    Cervical fusion syndrome    LEFT ARM NUMBNESS--  CONTROLLED W/ GABAPENTIN   Coronary artery disease    CARDIOLOGIST-- DR Harl Bowie [name])   DDD (degenerative disc disease), cervical    DDD (degenerative disc disease), lumbar    Diverticulosis of sigmoid colon    History of acute gouty arthritis    History of adenomatous polyp of colon    2001;  2012- non-malignant leiomyoma;  04-07-2014  tubualr adenoma and hyperplastic polyp's   History of prostate cancer UROLOGIST-  DR [facility]   S/P  RADIOACTIVE SEED IMPLANTS 2004   HOH (hard of hearing)    no hearing aids   Hyperlipidemia    Hypertension    OA (osteoarthritis)    Occlusion and stenosis of carotid artery without mention of cerebral infarction CARDIOLOGIST  -- DR  [name]   LAST DUPLEX  11-23-2015  RICA  (DUPLEX DOPPLER 01-20-2016 RICA 56-25% PROXIMAL) /   LICA <63% (HAD CONSULT W/ [name] , VASCULAR SURGEON 01-20-2016)   OSA on CPAP    BiPAP   Spondylosis,  cervical    Type 2 diabetes mellitus ([place])    Wears glasses     Past Surgical History:  Procedure Laterality Date   ANTERIOR REMOVAL CAGE AND PLATE C3-C7/ CORPECTOMY C7 (FX)/  ALLOGRAFT AND FUSION C3 -- T1/  POSTERIOR DECOMPRESSION BILATERAL LAMINECTOMY C4 -- 6 & PARTIAL C3/  POSTEROLATERAL ARTHRODESIS C3 - T1  06/05/2005   APPENDECTOMY  1990'S   CATARACT EXTRACTION W/ INTRAOCULAR LENS  IMPLANT, BILATERAL  2013   CERVICAL FUSION  05/11/2005   C3  --  C7/  due to post op quadriparesis same day s/p  anterior C 4,5,6, colpectomy, decompression, removal epidural hematoma, foraminnotomy, cage and plate   COLONOSCOPY  last one 04-07-2014   CYSTOSCOPY W/ RETROGRADES Bilateral 08/19/2013   Procedure: CYSTOSCOPY WITH RETROGRADE PYELOGRAM;  Surgeon: Molli Hazard, MD;  Location: [facility];  Service: Urology;  Laterality: Bilateral;   CYSTOSCOPY W/ RETROGRADES Bilateral 07/18/2016   Procedure: CYSTOSCOPY WITH RETROGRADE PYELOGRAM;  Surgeon: Alexis Frock, MD;  Location: [facility];  Service: Urology;  Laterality: Bilateral;   ENDOSCOPIC REPAIR CSF LEAK VIA NASAL PASSAGE  2011   INTRAOPERATIVE ARTERIOGRAM  CATH LAB  01-05-2009  DR Einar Gip   RICA   ACUTE ANGLE 80-85%  STENOSIS   KNEE ARTHROSCOPY Left 07/15/2017   Procedure: ARTHROSCOPY LEFT  KNEE AND DEBRIDEMENT, medial meniscal tear and chondromalaita;  Surgeon: Paralee Cancel, MD;  Location: [facility];  Service: Orthopedics;  Laterality: Left;  [facility]  03-26-2012   L3 --  L5   RADIOACTIVE PROSTATE SEED IMPLANTS  08-19-2003   SEPTOPLASTY  1998   TOTAL KNEE ARTHROPLASTY Left 02/25/2018   Procedure: LEFT TOTAL KNEE ARTHROPLASTY;  Surgeon: Paralee Cancel, MD;  Location: [facility];  Service: Orthopedics;  Laterality: Left;  70 mins   TRANSURETHRAL RESECTION OF BLADDER TUMOR N/A 08/19/2013   Procedure: TRANSURETHRAL RESECTION OF BLADDER TUMOR (TURBT);  Surgeon: Molli Hazard, MD;  Location: [facility];  Service: Urology;  Laterality: N/A;   TRANSURETHRAL RESECTION OF BLADDER TUMOR N/A 07/18/2016   Procedure: TRANSURETHRAL RESECTION OF BLADDER TUMOR (TURBT);  Surgeon: Alexis Frock, MD;  Location: [facility];  Service: Urology;  Laterality: N/A;   YAG LASER APPLICATION  50/53/9767   Procedure: YAG LASER APPLICATION;  Surgeon: Elta Guadeloupe T. Gershon Crane, MD;  Location: [facility];  Service: Ophthalmology;  Laterality: Right;    There were no vitals filed for this visit.    Subjective Assessment - 03/27/21 0828     Subjective Patient is a 75 y.o. male who presents to physical therapy with c/o L hip pain with referral for trochanteric bursitis of L hip. Patient states it feels like the bone is coming out the back of his butt. Symptoms have been going on for about a month with insidious onset. They gave him a shot of cortisone without relief. Patient states increase in symptoms with walking, chores, yard work. All exercises makes it worse. He has not found anything that helps. Patient states his main goal is to stop this sucker from hurting. Hx L TKA    Limitations Walking;House hold activities;Lifting;Standing    Patient Stated Goals get hip to stop hurting    Currently in Pain? Yes    Pain Score 4     Pain Location Hip    Pain Orientation Left    Pain Descriptors / Indicators Dull    Pain Type Acute pain    Pain Onset More than a month ago    Pain Frequency Constant                [facility] PT Assessment - 03/27/21 0001       Assessment   Medical Diagnosis Trochanteric Bursitis of L hip    Referring Provider (PT) Rod Can MD    Onset Date/Surgical Date 02/25/21    Next MD Visit none scheduled    Prior Therapy L TKA      Precautions   Precautions None      Restrictions   Weight Bearing Restrictions No      Balance Screen   Has the patient fallen in the past 6 months No    Has the patient had a decrease in activity level because of a fear of falling?   Yes    Is the patient reluctant to leave their home because of a fear of falling?  No      Prior Function   Level of Independence Independent    Vocation Retired      Charity fundraiser Status Within Functional Limits for tasks assessed      Observation/Other Assessments   Observations Ambulates without AD, R truncal lean and trunk flexed; seated slouched off LLE  Focus on Therapeutic Outcomes (FOTO)  53% function      ROM / Strength   AROM / PROM / Strength AROM;Strength      AROM   Overall AROM  Within functional limits for tasks performed      Strength   Strength Assessment Site Hip;Knee;Ankle    Right/Left Hip Right;Left    Right Hip Flexion 4+/5    Right Hip Extension 4/5    Left Hip Flexion 4-/5    Left Hip Extension 4-/5    Left Hip ABduction 4-/5   pain   Right/Left Knee Right;Left    Right Knee Flexion 5/5    Right Knee Extension 5/5    Left Knee Flexion 5/5    Left Knee Extension 5/5    Right/Left Ankle Left;Right    Right Ankle Dorsiflexion 5/5    Left Ankle Dorsiflexion 5/5      Palpation   Palpation comment TTP L piriformis, glute med/min      Transfers   Comments labored with use of hands      Ambulation/Gait   Ambulation/Gait Yes    Ambulation/Gait Assistance 6: Modified independent (Device/Increase time)    Ambulation Distance (Feet) 250 Feet    Assistive device None    Gait Pattern Trendelenburg    Ambulation Surface Level;Indoor    Gait velocity decreased    Gait Comments 2MWT, had to rest at 1:50 due to pain/fatigue                        Objective measurements completed on examination: See above findings.       [facility] Adult PT Treatment/Exercise - 03/27/21 0001       Exercises   Exercises Knee/Hip      Knee/Hip Exercises: Supine   Bridges 10 reps      Knee/Hip Exercises: Sidelying   Clams 2x10 LLE                    PT Education - 03/27/21 0828     Education Details Patient educated on  exam findings, POC, scope of PT, HEP    Person(s) Educated Patient    Methods Explanation;Demonstration;Handout    Comprehension Verbalized understanding;Returned demonstration              PT Short Term Goals - 03/27/21 0908       PT SHORT TERM GOAL #1   Title Patient will be independent with HEP in order to improve functional outcomes.    Time 3    Period Weeks    Status Ricky    Target Date 04/17/21      PT SHORT TERM GOAL #2   Title Patient will report at least 25% improvement in symptoms for improved quality of life.    Time 3    Period Weeks    Status Ricky    Target Date 04/17/21               PT Long Term Goals - 03/27/21 0908       PT LONG TERM GOAL #1   Title Patient will report at least 75% improvement in symptoms for improved quality of life.    Time 6    Period Weeks    Status Ricky    Target Date 05/08/21      PT LONG TERM GOAL #2   Title Patient will improve FOTO score by at least 10 points in order to indicate improved tolerance to  activity.    Time 6    Period Weeks    Status Ricky    Target Date 05/08/21      PT LONG TERM GOAL #3   Title Patient will be able to ambulate at least 325 feet in 2MWT in order to demonstrate improved gait speed for community ambulation.    Time 6    Period Weeks    Status Ricky    Target Date 05/08/21                    Plan - 03/27/21 0905     Clinical Impression Statement Patient is a 75 y.o. male who presents to physical therapy with c/o L hip pain with referral for trochanteric bursitis of L hip. He presents with pain limited deficits in L hip strength, endurance, gait, balance and functional mobility with ADL. He is having to modify and restrict ADL as indicated by FOTO score as well as subjective information and objective measures which is affecting overall participation. Patient will benefit from skilled physical therapy in order to improve function and reduce impairment.    Personal Factors and  Comorbidities Age;Comorbidity 3+    Comorbidities Back pain/surgeries, BMI over 30, hx Cancer, Diabetes, High Blood  Pressure,    Examination-Activity Limitations Locomotion Level;Transfers;Stand;Stairs;Squat;Lift;Caring for Others;Carry    Examination-Participation Restrictions Cleaning;Community Activity;Interpersonal Relationship;Laundry;Shop;Volunteer;Yard Work;Meal Prep    Stability/Clinical Decision Making Stable/Uncomplicated    Clinical Decision Making Low    Rehab Potential Good    PT Frequency 2x / week    PT Duration 6 weeks    PT Treatment/Interventions ADLs/Self Care Home Management;Aquatic Therapy;Cryotherapy;Electrical Stimulation;Iontophoresis 4mg /ml Dexamethasone;Moist Heat;Traction;Ultrasound;Parrafin;DME Instruction;Gait training;Stair training;Functional mobility training;Therapeutic activities;Therapeutic exercise;Balance training;Neuromuscular re-education;Cognitive remediation;Patient/family education;Orthotic Fit/Training;Manual techniques;Manual lymph drainage;Compression bandaging;Dry needling;Energy conservation;Splinting;Taping    PT Next Visit Plan core and hip strength with emphasis on glutes, possibly piriformis stretch    PT Home Exercise Plan bridge, clam    Consulted and Agree with Plan of Care Patient             Patient will benefit from skilled therapeutic intervention in order to improve the following deficits and impairments:  Abnormal gait, Difficulty walking, Decreased endurance, Increased muscle spasms, Decreased activity tolerance, Pain, Decreased balance, Improper body mechanics, Decreased mobility, Decreased strength  Visit Diagnosis: Pain in left hip  Muscle weakness (generalized)  Other abnormalities of gait and mobility  Other symptoms and signs involving the musculoskeletal system     Problem List Patient Active Problem List   Diagnosis Date Noted   Obese 02/28/2018   S/P left TKA 02/25/2018   S/P total knee replacement  02/25/2018   Fever 10/09/2016   Community acquired pneumonia of left lower lobe of lung 10/09/2016   Gram negative sepsis ([place]) 02/05/2014   UTI (urinary tract infection) 02/04/2014   Sepsis ([place]) 02/04/2014   DM type 2 (diabetes mellitus, type 2) ([place]) 02/04/2014   Tobacco abuse 02/04/2014   Preop cardiovascular exam 08/05/2013   CAD (coronary artery disease) 08/05/2012   Degenerative lumbar spinal stenosis 04/03/2012   Chest pain 04/03/2012   Anemia 04/03/2012   Smoking 06/13/2011   JOINT EFFUSION, KNEE 01/11/2010   ARTHRITIS, RIGHT KNEE 12/12/2009   PAIN IN JOINT, LOWER LEG 12/12/2009   PROSTATE CANCER 11/08/2009   GOUTY ARTHRITIS, CHRONIC 11/08/2009   Overweight 11/08/2009   HYPERTENSION, BENIGN 11/08/2009   Carotid artery disease without cerebral infarction ([place]) 11/08/2009   Malignant neoplasm of bladder ([place]) 03/22/2008   Hyperlipidemia 03/22/2008  PAD (peripheral artery disease) ([place]) 03/22/2008   CONSTIPATION, CHRONIC 03/22/2008   DEGENERATIVE JOINT DISEASE 03/22/2008   OSA (obstructive sleep apnea) 03/22/2008   PROSTATE CANCER, HX OF 03/22/2008   DIVERTICULITIS, HX OF 03/22/2008    9:11 AM, 03/27/21 Mearl Latin PT, DPT Physical Therapist at [facility] [place], [place], [address] Phone: [phone]   Fax:  [phone]  Name: [name] MRN: [id number] Date of Birth: 27-Mar-1946

## 2021-03-27 NOTE — Patient Instructions (Signed)
Access Code: [email or web address] URL: [email or web address] Date: 03/27/2021 Prepared by: Mitzi Hansen Markise Haymer  Exercises Supine Bridge - 1-2 x daily - 7 x weekly - 2 sets - 10 reps Clamshell - 1-2 x daily - 7 x weekly - 2 sets - 10 reps

## 2021-04-04 ENCOUNTER — Ambulatory Visit (HOSPITAL_COMMUNITY): Payer: Medicare HMO | Admitting: Physical Therapy

## 2021-04-04 ENCOUNTER — Other Ambulatory Visit: Payer: Self-pay

## 2021-04-04 ENCOUNTER — Encounter (HOSPITAL_COMMUNITY): Payer: Self-pay | Admitting: Physical Therapy

## 2021-04-04 DIAGNOSIS — M25552 Pain in left hip: Secondary | ICD-10-CM

## 2021-04-04 DIAGNOSIS — M6281 Muscle weakness (generalized): Secondary | ICD-10-CM

## 2021-04-04 DIAGNOSIS — R2689 Other abnormalities of gait and mobility: Secondary | ICD-10-CM

## 2021-04-04 DIAGNOSIS — R29898 Other symptoms and signs involving the musculoskeletal system: Secondary | ICD-10-CM

## 2021-04-04 NOTE — Patient Instructions (Signed)
Access Code: [email or web address] URL: [email or web address] Date: 04/04/2021 Prepared by: Mitzi Hansen Maisa Bedingfield  Exercises Prone Hip Extension - 1 x daily - 7 x weekly - 2 sets - 10 reps Sit to Stand with Arms Crossed - 1 x daily - 7 x weekly - 2 sets - 10 reps

## 2021-04-04 NOTE — Therapy (Signed)
[facility] [address] [place], [place], [address] Phone: [phone]   Fax:  [phone]  Physical Therapy Treatment  Patient Details  Name: [name] MRN: [id number] Date of Birth: 02-19-1946 Referring Provider (PT): Rod Can MD   Encounter Date: 04/04/2021   PT End of Session - 04/04/21 0825     Visit Number 2    Number of Visits 12    Date for PT Re-Evaluation 05/08/21    Authorization Type Aetna Medicare (no visit limit)    Progress Note Due on Visit 10    PT Start Time 0828    PT Stop Time 0908    PT Time Calculation (min) 40 min    Activity Tolerance Patient tolerated treatment well    Behavior During Therapy [facility] for tasks assessed/performed             Past Medical History:  Diagnosis Date   Bilateral renal cysts    Bladder cancer ([place]) UROLOGIST-  DR [facility]   RECURRENT 11/ 2017 --  hx turbt in 2004 and 08-19-2013   BPH without obstruction/lower urinary tract symptoms    Cervical fusion syndrome    LEFT ARM NUMBNESS--  CONTROLLED W/ GABAPENTIN   Coronary artery disease    CARDIOLOGIST-- DR Harl Bowie [name])   DDD (degenerative disc disease), cervical    DDD (degenerative disc disease), lumbar    Diverticulosis of sigmoid colon    History of acute gouty arthritis    History of adenomatous polyp of colon    2001;  2012- non-malignant leiomyoma;  04-07-2014  tubualr adenoma and hyperplastic polyp's   History of prostate cancer UROLOGIST-  DR [facility]   S/P  RADIOACTIVE SEED IMPLANTS 2004   HOH (hard of hearing)    no hearing aids   Hyperlipidemia    Hypertension    OA (osteoarthritis)    Occlusion and stenosis of carotid artery without mention of cerebral infarction CARDIOLOGIST  -- DR  [name]   LAST DUPLEX  11-23-2015  RICA  (DUPLEX DOPPLER 01-20-2016 RICA 95-63% PROXIMAL) /   LICA <87% (HAD CONSULT W/ [name] , VASCULAR SURGEON 01-20-2016)   OSA on CPAP    BiPAP   Spondylosis,  cervical    Type 2 diabetes mellitus ([place])    Wears glasses     Past Surgical History:  Procedure Laterality Date   ANTERIOR REMOVAL CAGE AND PLATE C3-C7/ CORPECTOMY C7 (FX)/  ALLOGRAFT AND FUSION C3 -- T1/  POSTERIOR DECOMPRESSION BILATERAL LAMINECTOMY C4 -- 6 & PARTIAL C3/  POSTEROLATERAL ARTHRODESIS C3 - T1  06/05/2005   APPENDECTOMY  1990'S   CATARACT EXTRACTION W/ INTRAOCULAR LENS  IMPLANT, BILATERAL  2013   CERVICAL FUSION  05/11/2005   C3  --  C7/  due to post op quadriparesis same day s/p  anterior C 4,5,6, colpectomy, decompression, removal epidural hematoma, foraminnotomy, cage and plate   COLONOSCOPY  last one 04-07-2014   CYSTOSCOPY W/ RETROGRADES Bilateral 08/19/2013   Procedure: CYSTOSCOPY WITH RETROGRADE PYELOGRAM;  Surgeon: Molli Hazard, MD;  Location: [facility];  Service: Urology;  Laterality: Bilateral;   CYSTOSCOPY W/ RETROGRADES Bilateral 07/18/2016   Procedure: CYSTOSCOPY WITH RETROGRADE PYELOGRAM;  Surgeon: Alexis Frock, MD;  Location: [facility];  Service: Urology;  Laterality: Bilateral;   ENDOSCOPIC REPAIR CSF LEAK VIA NASAL PASSAGE  2011   INTRAOPERATIVE ARTERIOGRAM  CATH LAB  01-05-2009  DR Einar Gip   RICA   ACUTE ANGLE 80-85%  STENOSIS   KNEE ARTHROSCOPY Left 07/15/2017   Procedure: ARTHROSCOPY LEFT  KNEE AND DEBRIDEMENT, medial meniscal tear and chondromalaita;  Surgeon: Paralee Cancel, MD;  [facility];  Service: Orthopedics;  Laterality: Left;  [place]  03-26-2012   L3 --  L5   RADIOACTIVE PROSTATE SEED IMPLANTS  08-19-2003   SEPTOPLASTY  1998   TOTAL KNEE ARTHROPLASTY Left 02/25/2018   Procedure: LEFT TOTAL KNEE ARTHROPLASTY;  Surgeon: Paralee Cancel, MD;  Location[facility];  Service: Orthopedics;  Laterality: Left;  70 mins   TRANSURETHRAL RESECTION OF BLADDER TUMOR N/A 08/19/2013   Procedure: TRANSURETHRAL RESECTION OF BLADDER TUMOR (TURBT);  Surgeon: Molli Hazard, MD;  Location: [facility];  Service: Urology;  Laterality: N/A;   TRANSURETHRAL RESECTION OF BLADDER TUMOR N/A 07/18/2016   Procedure: TRANSURETHRAL RESECTION OF BLADDER TUMOR (TURBT);  Surgeon: Alexis Frock, MD;  Location: [facility];  Service: Urology;  Laterality: N/A;   YAG LASER APPLICATION  09/19/7251   Procedure: YAG LASER APPLICATION;  Surgeon: Elta Guadeloupe T. Gershon Crane, MD;  Location: [facility];  Service: Ophthalmology;  Laterality: Right;    There were no vitals filed for this visit.   Subjective Assessment - 04/04/21 0826     Subjective Patient states his hip has been sore. Doing exercises 2x/day. Feels like slow progress.    Limitations Walking;House hold activities;Lifting;Standing    Patient Stated Goals get hip to stop hurting    Currently in Pain? Yes    Pain Score 5     Pain Location Hip    Pain Orientation Left    Pain Descriptors / Indicators Dull    Pain Type Acute pain    Pain Onset More than a month ago    Pain Frequency Constant                               [facility] Adult PT Treatment/Exercise - 04/04/21 0001       Knee/Hip Exercises: Stretches   Other Knee/Hip Stretches SKTC 3x 30 second holds      Knee/Hip Exercises: Standing   Hip Abduction Both;2 sets;10 reps    Abduction Limitations cueing for glute activation on stance leg      Knee/Hip Exercises: Seated   Sit to [employer] 10 reps      Knee/Hip Exercises: Supine   Bridges Both;2 sets;10 reps    Straight Leg Raises Both;2 sets;10 reps      Knee/Hip Exercises: Sidelying   Clams 2x10 LLE      Knee/Hip Exercises: Prone   Hip Extension Both;2 sets;10 reps                    PT Education - 04/04/21 0826     Education Details HEP    Person(s) Educated Patient    Methods Explanation    Comprehension Verbalized understanding              PT Short Term Goals - 03/27/21 0908       PT SHORT TERM GOAL #1   Title Patient will be independent with HEP in order to improve  functional outcomes.    Time 3    Period Weeks    Status New    Target Date 04/17/21      PT SHORT TERM GOAL #2   Title Patient will report at least 25% improvement in symptoms for improved quality of  life.    Time 3    Period Weeks    Status New    Target Date 04/17/21               PT Long Term Goals - 03/27/21 0908       PT LONG TERM GOAL #1   Title Patient will report at least 75% improvement in symptoms for improved quality of life.    Time 6    Period Weeks    Status New    Target Date 05/08/21      PT LONG TERM GOAL #2   Title Patient will improve FOTO score by at least 10 points in order to indicate improved tolerance to activity.    Time 6    Period Weeks    Status New    Target Date 05/08/21      PT LONG TERM GOAL #3   Title Patient will be able to ambulate at least 325 feet in 2MWT in order to demonstrate improved gait speed for community ambulation.    Time 6    Period Weeks    Status New    Target Date 05/08/21                   Plan - 04/04/21 0826     Clinical Impression Statement Patient requires minimal verbal cueing for mechanics of previously completed exercises with good carry over. Continued with core and hip strengthening today which is tolerated well with minimal intermittent verbal and tactile cueing for mechanics. Patient stating improvement in symptom at end of session. Patient will continue to benefit from skilled physical therapy in order reduce impairment and improve function.    Personal Factors and Comorbidities Age;Comorbidity 3+    Comorbidities Back pain/surgeries, BMI over 30, hx Cancer, Diabetes, High Blood  Pressure,    Examination-Activity Limitations Locomotion Level;Transfers;Stand;Stairs;Squat;Lift;Caring for Others;Carry    Examination-Participation Restrictions Cleaning;Community Activity;Interpersonal Relationship;Laundry;Shop;Volunteer;Yard Work;Meal Prep    Stability/Clinical Decision Making  Stable/Uncomplicated    Rehab Potential Good    PT Frequency 2x / week    PT Duration 6 weeks    PT Treatment/Interventions ADLs/Self Care Home Management;Aquatic Therapy;Cryotherapy;Electrical Stimulation;Iontophoresis 4mg /ml Dexamethasone;Moist Heat;Traction;Ultrasound;Parrafin;DME Instruction;Gait training;Stair training;Functional mobility training;Therapeutic activities;Therapeutic exercise;Balance training;Neuromuscular re-education;Cognitive remediation;Patient/family education;Orthotic Fit/Training;Manual techniques;Manual lymph drainage;Compression bandaging;Dry needling;Energy conservation;Splinting;Taping    PT Next Visit Plan core and hip strength with emphasis on glutes    PT Home Exercise Plan bridge, clam 7/19 STS, hip extension    Consulted and Agree with Plan of Care Patient             Patient will benefit from skilled therapeutic intervention in order to improve the following deficits and impairments:  Abnormal gait, Difficulty walking, Decreased endurance, Increased muscle spasms, Decreased activity tolerance, Pain, Decreased balance, Improper body mechanics, Decreased mobility, Decreased strength  Visit Diagnosis: Pain in left hip  Muscle weakness (generalized)  Other abnormalities of gait and mobility  Other symptoms and signs involving the musculoskeletal system     Problem List Patient Active Problem List   Diagnosis Date Noted   Obese 02/28/2018   S/P left TKA 02/25/2018   S/P total knee replacement 02/25/2018   Fever 10/09/2016   Community acquired pneumonia of left lower lobe of lung 10/09/2016   Gram negative sepsis ([place]) 02/05/2014   UTI (urinary tract infection) 02/04/2014   Sepsis ([place]) 02/04/2014   DM type 2 (diabetes mellitus, type 2) ([place]) 02/04/2014   Tobacco abuse 02/04/2014   Preop cardiovascular exam 08/05/2013  CAD (coronary artery disease) 08/05/2012   Degenerative lumbar spinal stenosis 04/03/2012   Chest pain 04/03/2012   Anemia  04/03/2012   Smoking 06/13/2011   JOINT EFFUSION, KNEE 01/11/2010   ARTHRITIS, RIGHT KNEE 12/12/2009   PAIN IN JOINT, LOWER LEG 12/12/2009   PROSTATE CANCER 11/08/2009   GOUTY ARTHRITIS, CHRONIC 11/08/2009   Overweight 11/08/2009   HYPERTENSION, BENIGN 11/08/2009   Carotid artery disease without cerebral infarction ([place]) 11/08/2009   Malignant neoplasm of bladder ([place]) 03/22/2008   Hyperlipidemia 03/22/2008   PAD (peripheral artery disease) ([place]) 03/22/2008   CONSTIPATION, CHRONIC 03/22/2008   DEGENERATIVE JOINT DISEASE 03/22/2008   OSA (obstructive sleep apnea) 03/22/2008   PROSTATE CANCER, HX OF 03/22/2008   DIVERTICULITIS, HX OF 03/22/2008    9:07 AM, 04/04/21 Mearl Latin PT, DPT Physical Therapist at [facility] [place], [place], [address] Phone: [phone]   Fax:  [phone]  Name: [name] MRN: [id number] Date of Birth: December 09, 1945

## 2021-04-06 ENCOUNTER — Encounter (HOSPITAL_COMMUNITY): Payer: Self-pay | Admitting: Physical Therapy

## 2021-04-06 ENCOUNTER — Other Ambulatory Visit: Payer: Self-pay

## 2021-04-06 ENCOUNTER — Ambulatory Visit (HOSPITAL_COMMUNITY): Payer: Medicare HMO | Admitting: Physical Therapy

## 2021-04-06 DIAGNOSIS — R2689 Other abnormalities of gait and mobility: Secondary | ICD-10-CM

## 2021-04-06 DIAGNOSIS — R29898 Other symptoms and signs involving the musculoskeletal system: Secondary | ICD-10-CM

## 2021-04-06 DIAGNOSIS — M25552 Pain in left hip: Secondary | ICD-10-CM | POA: Diagnosis not present

## 2021-04-06 DIAGNOSIS — M6281 Muscle weakness (generalized): Secondary | ICD-10-CM

## 2021-04-06 NOTE — Therapy (Signed)
[facility] [address] [place], [place], [address] Phone: [phone]   Fax:  [phone]  Physical Therapy Treatment  Patient Details  Name: [name] MRN: [id number] Date of Birth: 1946-04-14 Referring Provider (PT): Rod Can MD   Encounter Date: 04/06/2021   PT End of Session - 04/06/21 0833     Visit Number 3    Number of Visits 12    Date for PT Re-Evaluation 05/08/21    Authorization Type Aetna Medicare (no visit limit)    Progress Note Due on Visit 10    PT Start Time 0833    PT Stop Time 0911    PT Time Calculation (min) 38 min    Activity Tolerance Patient tolerated treatment well    Behavior During Therapy [facility] for tasks assessed/performed             Past Medical History:  Diagnosis Date   Bilateral renal cysts    Bladder cancer ([place]) UROLOGIST-  DR [facility]   RECURRENT 11/ 2017 --  hx turbt in 2004 and 08-19-2013   BPH without obstruction/lower urinary tract symptoms    Cervical fusion syndrome    LEFT ARM NUMBNESS--  CONTROLLED W/ GABAPENTIN   Coronary artery disease    CARDIOLOGIST-- DR Harl Bowie [name])   DDD (degenerative disc disease), cervical    DDD (degenerative disc disease), lumbar    Diverticulosis of sigmoid colon    History of acute gouty arthritis    History of adenomatous polyp of colon    2001;  2012- non-malignant leiomyoma;  04-07-2014  tubualr adenoma and hyperplastic polyp's   History of prostate cancer UROLOGIST-  DR [facility]   S/P  RADIOACTIVE SEED IMPLANTS 2004   HOH (hard of hearing)    no hearing aids   Hyperlipidemia    Hypertension    OA (osteoarthritis)    Occlusion and stenosis of carotid artery without mention of cerebral infarction CARDIOLOGIST  -- DR  [name]   LAST DUPLEX  11-23-2015  RICA  (DUPLEX DOPPLER 01-20-2016 RICA 19-62% PROXIMAL) /   LICA <22% (HAD CONSULT W/ [name] , VASCULAR SURGEON 01-20-2016)   OSA on CPAP    BiPAP   Spondylosis,  cervical    Type 2 diabetes mellitus ([place])    Wears glasses     Past Surgical History:  Procedure Laterality Date   ANTERIOR REMOVAL CAGE AND PLATE C3-C7/ CORPECTOMY C7 (FX)/  ALLOGRAFT AND FUSION C3 -- T1/  POSTERIOR DECOMPRESSION BILATERAL LAMINECTOMY C4 -- 6 & PARTIAL C3/  POSTEROLATERAL ARTHRODESIS C3 - T1  06/05/2005   APPENDECTOMY  1990'S   CATARACT EXTRACTION W/ INTRAOCULAR LENS  IMPLANT, BILATERAL  2013   CERVICAL FUSION  05/11/2005   C3  --  C7/  due to post op quadriparesis same day s/p  anterior C 4,5,6, colpectomy, decompression, removal epidural hematoma, foraminnotomy, cage and plate   COLONOSCOPY  last one 04-07-2014   CYSTOSCOPY W/ RETROGRADES Bilateral 08/19/2013   Procedure: CYSTOSCOPY WITH RETROGRADE PYELOGRAM;  Surgeon: Molli Hazard, MD;  Location: [facility];  Service: Urology;  Laterality: Bilateral;   CYSTOSCOPY W/ RETROGRADES Bilateral 07/18/2016   Procedure: CYSTOSCOPY WITH RETROGRADE PYELOGRAM;  Surgeon: Alexis Frock, MD;  Location: [facility];  Service: Urology;  Laterality: Bilateral;   ENDOSCOPIC REPAIR CSF LEAK VIA NASAL PASSAGE  2011   INTRAOPERATIVE ARTERIOGRAM  CATH LAB  01-05-2009  DR Einar Gip   RICA   ACUTE ANGLE 80-85%  STENOSIS   KNEE ARTHROSCOPY Left 07/15/2017   Procedure: ARTHROSCOPY LEFT  KNEE AND DEBRIDEMENT, medial meniscal tear and chondromalaita;  Surgeon: Paralee Cancel, MD;  Location: [facility];  Service: Orthopedics;  Laterality: Left;  [place]  03-26-2012   L3 --  L5   RADIOACTIVE PROSTATE SEED IMPLANTS  08-19-2003   SEPTOPLASTY  1998   TOTAL KNEE ARTHROPLASTY Left 02/25/2018   Procedure: LEFT TOTAL KNEE ARTHROPLASTY;  Surgeon: Paralee Cancel, MD;  Location: [facility];  Service: Orthopedics;  Laterality: Left;  70 mins   TRANSURETHRAL RESECTION OF BLADDER TUMOR N/A 08/19/2013   Procedure: TRANSURETHRAL RESECTION OF BLADDER TUMOR (TURBT);  Surgeon: Molli Hazard, MD;  Location: [facility];  Service: Urology;  Laterality: N/A;   TRANSURETHRAL RESECTION OF BLADDER TUMOR N/A 07/18/2016   Procedure: TRANSURETHRAL RESECTION OF BLADDER TUMOR (TURBT);  Surgeon: Alexis Frock, MD;  Location: [facility];  Service: Urology;  Laterality: N/A;   YAG LASER APPLICATION  86/57/8469   Procedure: YAG LASER APPLICATION;  Surgeon: Elta Guadeloupe T. Gershon Crane, MD;  Location: [facility];  Service: Ophthalmology;  Laterality: Right;    There were no vitals filed for this visit.   Subjective Assessment - 04/06/21 0833     Subjective Patient states soreness in the hip from exercises. Pain in the legs.    Limitations Walking;House hold activities;Lifting;Standing    Patient Stated Goals get hip to stop hurting    Currently in Pain? Yes    Pain Score 5     Pain Location Hip    Pain Orientation Left    Pain Descriptors / Indicators Dull    Pain Type Acute pain    Pain Onset More than a month ago    Pain Frequency Constant                               [facility] Adult PT Treatment/Exercise - 04/06/21 0001       Knee/Hip Exercises: Stretches   Other Knee/Hip Stretches SKTC 5x 30 second holds      Knee/Hip Exercises: Standing   Hip Abduction Both;2 sets;10 reps    Abduction Limitations cueing for glute activation on stance leg    Hip Extension Both;2 sets;10 reps      Knee/Hip Exercises: Seated   Sit to [employer] 10 reps      Knee/Hip Exercises: Supine   Bridges Both;2 sets;10 reps    Straight Leg Raises Both;2 sets;10 reps      Knee/Hip Exercises: Sidelying   Clams 3x10 LLE                    PT Education - 04/06/21 0833     Education Details HEP    Person(s) Educated Patient    Methods Explanation    Comprehension Verbalized understanding              PT Short Term Goals - 03/27/21 0908       PT SHORT TERM GOAL #1   Title Patient will be independent with HEP in order to improve functional outcomes.    Time 3    Period Weeks     Status New    Target Date 04/17/21      PT SHORT TERM GOAL #2   Title Patient will report at least 25% improvement in symptoms for improved quality of life.    Time 3  Period Weeks    Status New    Target Date 04/17/21               PT Long Term Goals - 03/27/21 0908       PT LONG TERM GOAL #1   Title Patient will report at least 75% improvement in symptoms for improved quality of life.    Time 6    Period Weeks    Status New    Target Date 05/08/21      PT LONG TERM GOAL #2   Title Patient will improve FOTO score by at least 10 points in order to indicate improved tolerance to activity.    Time 6    Period Weeks    Status New    Target Date 05/08/21      PT LONG TERM GOAL #3   Title Patient will be able to ambulate at least 325 feet in 2MWT in order to demonstrate improved gait speed for community ambulation.    Time 6    Period Weeks    Status New    Target Date 05/08/21                   Plan - 04/06/21 2694     Clinical Impression Statement Patient educated on possible muscle soreness resulting in increasing symptoms lately. Patient states decreasing symptoms with exercise today. Patient given minimal cueing for exercise mechanics with good carry over. Continued with glute strengthening which patient tolerates well today. Patient will continue to benefit from skilled physical therapy in order to reduce impairment and improve function.    Personal Factors and Comorbidities Age;Comorbidity 3+    Comorbidities Back pain/surgeries, BMI over 30, hx Cancer, Diabetes, High Blood  Pressure,    Examination-Activity Limitations Locomotion Level;Transfers;Stand;Stairs;Squat;Lift;Caring for Others;Carry    Examination-Participation Restrictions Cleaning;Community Activity;Interpersonal Relationship;Laundry;Shop;Volunteer;Yard Work;Meal Prep    Stability/Clinical Decision Making Stable/Uncomplicated    Rehab Potential Good    PT Frequency 2x / week    PT  Duration 6 weeks    PT Treatment/Interventions ADLs/Self Care Home Management;Aquatic Therapy;Cryotherapy;Electrical Stimulation;Iontophoresis 4mg /ml Dexamethasone;Moist Heat;Traction;Ultrasound;Parrafin;DME Instruction;Gait training;Stair training;Functional mobility training;Therapeutic activities;Therapeutic exercise;Balance training;Neuromuscular re-education;Cognitive remediation;Patient/family education;Orthotic Fit/Training;Manual techniques;Manual lymph drainage;Compression bandaging;Dry needling;Energy conservation;Splinting;Taping    PT Next Visit Plan core and hip strength with emphasis on glutes    PT Home Exercise Plan bridge, clam 7/19 STS, hip extension 7/21 standing hip extension    Consulted and Agree with Plan of Care Patient             Patient will benefit from skilled therapeutic intervention in order to improve the following deficits and impairments:  Abnormal gait, Difficulty walking, Decreased endurance, Increased muscle spasms, Decreased activity tolerance, Pain, Decreased balance, Improper body mechanics, Decreased mobility, Decreased strength  Visit Diagnosis: Pain in left hip  Muscle weakness (generalized)  Other abnormalities of gait and mobility  Other symptoms and signs involving the musculoskeletal system     Problem List Patient Active Problem List   Diagnosis Date Noted   Obese 02/28/2018   S/P left TKA 02/25/2018   S/P total knee replacement 02/25/2018   Fever 10/09/2016   Community acquired pneumonia of left lower lobe of lung 10/09/2016   Gram negative sepsis ([place]) 02/05/2014   UTI (urinary tract infection) 02/04/2014   Sepsis ([place]) 02/04/2014   DM type 2 (diabetes mellitus, type 2) ([place]) 02/04/2014   Tobacco abuse 02/04/2014   Preop cardiovascular exam 08/05/2013   CAD (coronary artery disease) 08/05/2012   Degenerative  lumbar spinal stenosis 04/03/2012   Chest pain 04/03/2012   Anemia 04/03/2012   Smoking 06/13/2011   JOINT EFFUSION,  KNEE 01/11/2010   ARTHRITIS, RIGHT KNEE 12/12/2009   PAIN IN JOINT, LOWER LEG 12/12/2009   PROSTATE CANCER 11/08/2009   GOUTY ARTHRITIS, CHRONIC 11/08/2009   Overweight 11/08/2009   HYPERTENSION, BENIGN 11/08/2009   Carotid artery disease without cerebral infarction ([place]) 11/08/2009   Malignant neoplasm of bladder ([place]) 03/22/2008   Hyperlipidemia 03/22/2008   PAD (peripheral artery disease) ([place]) 03/22/2008   CONSTIPATION, CHRONIC 03/22/2008   DEGENERATIVE JOINT DISEASE 03/22/2008   OSA (obstructive sleep apnea) 03/22/2008   PROSTATE CANCER, HX OF 03/22/2008   DIVERTICULITIS, HX OF 03/22/2008    9:12 AM, 04/06/21 Mearl Latin PT, DPT Physical Therapist at [facility] [place], [place], [address] Phone: [phone]   Fax:  [phone]  Name: [name] MRN: [id number] Date of Birth: 1946/06/01

## 2021-04-06 NOTE — Patient Instructions (Signed)
Access Code: [email or web address] URL: [email or web address] Date: 04/06/2021 Prepared by: Mitzi Hansen Mihran Lebarron  Exercises Standing Hip Extension with Counter Support - 1 x daily - 7 x weekly - 2 sets - 10 reps

## 2021-04-11 ENCOUNTER — Other Ambulatory Visit: Payer: Self-pay

## 2021-04-11 ENCOUNTER — Ambulatory Visit (HOSPITAL_COMMUNITY): Payer: Medicare HMO | Admitting: Physical Therapy

## 2021-04-11 DIAGNOSIS — R2689 Other abnormalities of gait and mobility: Secondary | ICD-10-CM

## 2021-04-11 DIAGNOSIS — M25552 Pain in left hip: Secondary | ICD-10-CM | POA: Diagnosis not present

## 2021-04-11 DIAGNOSIS — M6281 Muscle weakness (generalized): Secondary | ICD-10-CM

## 2021-04-11 DIAGNOSIS — R29898 Other symptoms and signs involving the musculoskeletal system: Secondary | ICD-10-CM

## 2021-04-11 NOTE — Therapy (Signed)
[facility] [address] [place], [place], [address] Phone: [phone]   Fax:  [phone]  Physical Therapy Treatment  Patient Details  Name: [name] MRN: [id number] Date of Birth: 1946/01/13 Referring Provider (PT): Rod Can MD   Encounter Date: 04/11/2021   PT End of Session - 04/11/21 0858     Visit Number 4    Number of Visits 12    Date for PT Re-Evaluation 05/08/21    Authorization Type Aetna Medicare (no visit limit)    Progress Note Due on Visit 10    PT Start Time 0830    PT Stop Time [id number]    PT Time Calculation (min) 44 min    Activity Tolerance Patient tolerated treatment well    Behavior During Therapy [facility] for tasks assessed/performed             Past Medical History:  Diagnosis Date   Bilateral renal cysts    Bladder cancer ([place]) UROLOGIST-  [facility]   RECURRENT 11/ 2017 --  hx turbt in 2004 and 08-19-2013   BPH without obstruction/lower urinary tract symptoms    Cervical fusion syndrome    LEFT ARM NUMBNESS--  CONTROLLED W/ GABAPENTIN   Coronary artery disease    CARDIOLOGIST-- DR Harl Bowie [name])   DDD (degenerative disc disease), cervical    DDD (degenerative disc disease), lumbar    Diverticulosis of sigmoid colon    History of acute gouty arthritis    History of adenomatous polyp of colon    2001;  2012- non-malignant leiomyoma;  04-07-2014  tubualr adenoma and hyperplastic polyp's   History of prostate cancer UROLOGIST-  DR [facility]   S/P  RADIOACTIVE SEED IMPLANTS 2004   HOH (hard of hearing)    no hearing aids   Hyperlipidemia    Hypertension    OA (osteoarthritis)    Occlusion and stenosis of carotid artery without mention of cerebral infarction CARDIOLOGIST  -- DR  [name]   LAST DUPLEX  11-23-2015  RICA  (DUPLEX DOPPLER 01-20-2016 RICA [id number] PROXIMAL) /   LICA [id number] (HAD CONSULT W/ [name] , VASCULAR SURGEON 01-20-2016)   OSA on CPAP    BiPAP   Spondylosis,  cervical    Type 2 diabetes mellitus ([place])    Wears glasses     Past Surgical History:  Procedure Laterality Date   ANTERIOR REMOVAL CAGE AND PLATE C3-C7/ CORPECTOMY C7 (FX)/  ALLOGRAFT AND FUSION C3 -- T1/  POSTERIOR DECOMPRESSION BILATERAL LAMINECTOMY C4 -- 6 & PARTIAL C3/  POSTEROLATERAL ARTHRODESIS C3 - T1  06/05/2005   APPENDECTOMY  1990'S   CATARACT EXTRACTION W/ INTRAOCULAR LENS  IMPLANT, BILATERAL  2013   CERVICAL FUSION  05/11/2005   C3  --  C7/  due to post op quadriparesis same day s/p  anterior C 4,5,6, colpectomy, decompression, removal epidural hematoma, foraminnotomy, cage and plate   COLONOSCOPY  last one 04-07-2014   CYSTOSCOPY W/ RETROGRADES Bilateral 08/19/2013   Procedure: CYSTOSCOPY WITH RETROGRADE PYELOGRAM;  Surgeon: Molli Hazard, MD;  Location: [facility];  Service: Urology;  Laterality: Bilateral;   CYSTOSCOPY W/ RETROGRADES Bilateral 07/18/2016   Procedure: CYSTOSCOPY WITH RETROGRADE PYELOGRAM;  Surgeon: Alexis Frock, MD;  Location: [facility];  Service: Urology;  Laterality: Bilateral;   ENDOSCOPIC REPAIR CSF LEAK VIA NASAL PASSAGE  2011   INTRAOPERATIVE ARTERIOGRAM  CATH LAB  01-05-2009  DR Einar Gip   RICA   ACUTE ANGLE 80-85%  STENOSIS   KNEE ARTHROSCOPY Left 07/15/2017   Procedure: ARTHROSCOPY LEFT  KNEE AND DEBRIDEMENT, medial meniscal tear and chondromalaita;  Surgeon: Paralee Cancel, MD;  Location[facility];  Service: Orthopedics;  Laterality: Left;  [place]  03-26-2012   L3 --  L5   RADIOACTIVE PROSTATE SEED IMPLANTS  08-19-2003   SEPTOPLASTY  1998   TOTAL KNEE ARTHROPLASTY Left 02/25/2018   Procedure: LEFT TOTAL KNEE ARTHROPLASTY;  Surgeon: Paralee Cancel, MD;  Location: [facility];  Service: Orthopedics;  Laterality: Left;  70 mins   TRANSURETHRAL RESECTION OF BLADDER TUMOR N/A 08/19/2013   Procedure: TRANSURETHRAL RESECTION OF BLADDER TUMOR (TURBT);  Surgeon: Molli Hazard, MD;  Location: [facility];  Service: Urology;  Laterality: N/A;   TRANSURETHRAL RESECTION OF BLADDER TUMOR N/A 07/18/2016   Procedure: TRANSURETHRAL RESECTION OF BLADDER TUMOR (TURBT);  Surgeon: Alexis Frock, MD;  Location: [facility];  Service: Urology;  Laterality: N/A;   YAG LASER APPLICATION  [id number]   Procedure: YAG LASER APPLICATION;  Surgeon: Elta Guadeloupe T. Gershon Crane, MD;  Location: [facility];  Service: Ophthalmology;  Laterality: Right;    There were no vitals filed for this visit.   Subjective Assessment - 04/11/21 0832     Subjective Pt states that he is doing his exercises at home.  He was trying 3x a day but he shut that off quickly.  He can tell that it is helping a little bit.    Limitations Walking;House hold activities;Lifting;Standing    Patient Stated Goals get hip to stop hurting    Currently in Pain? Yes    Pain Score 5     Pain Location Hip    Pain Orientation Left    Pain Descriptors / Indicators Dull    Pain Type Acute pain    Pain Onset More than a month ago    Pain Frequency Constant                               [facility] Adult PT Treatment/Exercise - 04/11/21 0001       Exercises   Exercises Knee/Hip      Knee/Hip Exercises: Stretches   Active Hamstring Stretch Both;3 reps;30 seconds    Piriformis Stretch Both;3 reps;30 seconds    Other Knee/Hip Stretches SKTC 3x 30 second holds    Other Knee/Hip Stretches hip excursion x 3      Knee/Hip Exercises: Standing   Other Standing Knee Exercises wall arch x 10      Knee/Hip Exercises: Seated   Other Seated Knee/Hip Exercises tall sitting with scapular retraction x 10    Sit to [place] 10 reps      Knee/Hip Exercises: Supine   Bridges --    Bridges with [employer] Strengthening;Both;10 reps;Limitations   hold 10 seconds     Knee/Hip Exercises: Sidelying   Clams 10                    PT Education - 04/11/21 0859     Education Details How posture affects spine, hip and knee  jts    Person(s) Educated Patient    Comprehension Verbalized understanding              PT Short Term Goals - 04/11/21 0904       PT SHORT TERM GOAL #1   Title Patient will be independent with HEP in order to  improve functional outcomes.    Time 3    Period Weeks    Status On-going    Target Date 04/17/21      PT SHORT TERM GOAL #2   Title Patient will report at least 25% improvement in symptoms for improved quality of life.    Time 3    Period Weeks    Status On-going    Target Date 04/17/21      PT SHORT TERM GOAL #3   Status On-going      PT SHORT TERM GOAL #4   Status On-going               PT Long Term Goals - 04/11/21 0904       PT LONG TERM GOAL #1   Title Patient will report at least 75% improvement in symptoms for improved quality of life.    Baseline 03/26/18: 4-117 degrees; 03/31/2018 7-120 Pt cleaned his car and was so sore that he has stiffened up a bit.     Time 6    Period Weeks    Status On-going      PT LONG TERM GOAL #2   Title Patient will improve FOTO score by at least 10 points in order to indicate improved tolerance to activity.    Time 6    Period Weeks    Status On-going      PT LONG TERM GOAL #3   Title Patient will be able to ambulate at least 325 feet in 2MWT in order to demonstrate improved gait speed for community ambulation.    Time 6    Period Weeks    Status On-going      PT LONG TERM GOAL #4   Status On-going                   Plan - 04/11/21 0859     Clinical Impression Statement Educated pt on the importance of good posture.  Added hip excursion, piriformis stretch and good sitting posture with scapular retraction.    Personal Factors and Comorbidities Age;Comorbidity 3+    Comorbidities Back pain/surgeries, BMI over 30, hx Cancer, Diabetes, High Blood  Pressure,    Examination-Activity Limitations Locomotion Level;Transfers;Stand;Stairs;Squat;Lift;Caring for Others;Carry    Examination-Participation  Restrictions Cleaning;Community Activity;Interpersonal Relationship;Laundry;Shop;Volunteer;Yard Work;Meal Prep    Stability/Clinical Decision Making Stable/Uncomplicated    Clinical Decision Making Low    Rehab Potential Good    PT Frequency 2x / week    PT Duration 6 weeks    PT Treatment/Interventions ADLs/Self Care Home Management;Aquatic Therapy;Cryotherapy;Electrical Stimulation;Iontophoresis '4mg'$ /ml Dexamethasone;Moist Heat;Traction;Ultrasound;Parrafin;DME Instruction;Gait training;Stair training;Functional mobility training;Therapeutic activities;Therapeutic exercise;Balance training;Neuromuscular re-education;Cognitive remediation;Patient/family education;Orthotic Fit/Training;Manual techniques;Manual lymph drainage;Compression bandaging;Dry needling;Energy conservation;Splinting;Taping    PT Next Visit Plan core and hip strength with emphasis on glutes    PT Home Exercise Plan bridge, clam 7/19 STS, hip extension 7/21 standing hip extension, 7/24 Added hip excursion, piriformis stretch and good sitting posture with scapular retraction    Consulted and Agree with Plan of Care Patient             Patient will benefit from skilled therapeutic intervention in order to improve the following deficits and impairments:  Abnormal gait, Difficulty walking, Decreased endurance, Increased muscle spasms, Decreased activity tolerance, Pain, Decreased balance, Improper body mechanics, Decreased mobility, Decreased strength  Visit Diagnosis: Pain in left hip  Muscle weakness (generalized)  Other abnormalities of gait and mobility  Other symptoms and signs involving the musculoskeletal system     Problem  List Patient Active Problem List   Diagnosis Date Noted   Obese 02/28/2018   S/P left TKA 02/25/2018   S/P total knee replacement 02/25/2018   Fever 10/09/2016   Community acquired pneumonia of left lower lobe of lung 10/09/2016   Gram negative sepsis ([place]) 02/05/2014   UTI (urinary  tract infection) 02/04/2014   Sepsis ([place]) 02/04/2014   DM type 2 (diabetes mellitus, type 2) ([place]) 02/04/2014   Tobacco abuse 02/04/2014   Preop cardiovascular exam 08/05/2013   CAD (coronary artery disease) 08/05/2012   Degenerative lumbar spinal stenosis 04/03/2012   Chest pain 04/03/2012   Anemia 04/03/2012   Smoking 06/13/2011   JOINT EFFUSION, KNEE 01/11/2010   ARTHRITIS, RIGHT KNEE 12/12/2009   PAIN IN JOINT, LOWER LEG 12/12/2009   PROSTATE CANCER 11/08/2009   GOUTY ARTHRITIS, CHRONIC 11/08/2009   Overweight 11/08/2009   HYPERTENSION, BENIGN 11/08/2009   Carotid artery disease without cerebral infarction ([place]) 11/08/2009   Malignant neoplasm of bladder ([place]) 03/22/2008   Hyperlipidemia 03/22/2008   PAD (peripheral artery disease) ([place]) 03/22/2008   CONSTIPATION, CHRONIC 03/22/2008   DEGENERATIVE JOINT DISEASE 03/22/2008   OSA (obstructive sleep apnea) 03/22/2008   PROSTATE CANCER, HX OF 03/22/2008   DIVERTICULITIS, HX OF 03/22/2008    Rayetta Humphrey, PT CLT [phone]  04/11/2021, 9:15 AM  [facility] [place], [place], [address] Phone: [phone]   Fax:  [phone]  Name: RAWLINS BOOKWALTER MRN: [id number] Date of Birth: [birth year]

## 2021-04-13 ENCOUNTER — Other Ambulatory Visit: Payer: Self-pay

## 2021-04-13 ENCOUNTER — Ambulatory Visit (HOSPITAL_COMMUNITY): Payer: Medicare HMO | Admitting: Physical Therapy

## 2021-04-13 DIAGNOSIS — M25552 Pain in left hip: Secondary | ICD-10-CM | POA: Diagnosis not present

## 2021-04-13 DIAGNOSIS — M6281 Muscle weakness (generalized): Secondary | ICD-10-CM

## 2021-04-13 DIAGNOSIS — R29898 Other symptoms and signs involving the musculoskeletal system: Secondary | ICD-10-CM

## 2021-04-13 DIAGNOSIS — R2689 Other abnormalities of gait and mobility: Secondary | ICD-10-CM

## 2021-04-13 NOTE — Therapy (Signed)
[facility] [address] [place], [place], [address] Phone: [phone]   Fax:  [phone]  Physical Therapy Treatment  Patient Details  Name: [name] MRN: [id number] Date of Birth: 1945/11/02 Referring Provider (PT): Rod Can MD   Encounter Date: 04/13/2021   PT End of Session - 04/13/21 0923     Visit Number 5    Number of Visits 12    Date for PT Re-Evaluation 05/08/21    Authorization Type Aetna Medicare (no visit limit)    Progress Note Due on Visit 10    PT Start Time 0843    PT Stop Time 0923    PT Time Calculation (min) 40 min    Activity Tolerance Patient tolerated treatment well    Behavior During Therapy [facility] for tasks assessed/performed             Past Medical History:  Diagnosis Date   Bilateral renal cysts    Bladder cancer ([place]) UROLOGIST-  DR [facility]   RECURRENT 11/ 2017 --  hx turbt in 2004 and 08-19-2013   BPH without obstruction/lower urinary tract symptoms    Cervical fusion syndrome    LEFT ARM NUMBNESS--  CONTROLLED W/ GABAPENTIN   Coronary artery disease    CARDIOLOGIST-- DR Harl Bowie [name])   DDD (degenerative disc disease), cervical    DDD (degenerative disc disease), lumbar    Diverticulosis of sigmoid colon    History of acute gouty arthritis    History of adenomatous polyp of colon    2001;  2012- non-malignant leiomyoma;  04-07-2014  tubualr adenoma and hyperplastic polyp's   History of prostate cancer UROLOGIST-  DR [facility]   S/P  RADIOACTIVE SEED IMPLANTS 2004   HOH (hard of hearing)    no hearing aids   Hyperlipidemia    Hypertension    OA (osteoarthritis)    Occlusion and stenosis of carotid artery without mention of cerebral infarction CARDIOLOGIST  -- DR  [name]   LAST DUPLEX  11-23-2015  RICA  (DUPLEX DOPPLER 01-20-2016 RICA [id number] PROXIMAL) /   LICA [id number] (HAD CONSULT W/ [name] , VASCULAR SURGEON 01-20-2016)   OSA on CPAP    BiPAP   Spondylosis,  cervical    Type 2 diabetes mellitus ([place])    Wears glasses     Past Surgical History:  Procedure Laterality Date   ANTERIOR REMOVAL CAGE AND PLATE C3-C7/ CORPECTOMY C7 (FX)/  ALLOGRAFT AND FUSION C3 -- T1/  POSTERIOR DECOMPRESSION BILATERAL LAMINECTOMY C4 -- 6 & PARTIAL C3/  POSTEROLATERAL ARTHRODESIS C3 - T1  06/05/2005   APPENDECTOMY  1990'S   CATARACT EXTRACTION W/ INTRAOCULAR LENS  IMPLANT, BILATERAL  2013   CERVICAL FUSION  05/11/2005   C3  --  C7/  due to post op quadriparesis same day s/p  anterior C 4,5,6, colpectomy, decompression, removal epidural hematoma, foraminnotomy, cage and plate   COLONOSCOPY  last one 04-07-2014   CYSTOSCOPY W/ RETROGRADES Bilateral 08/19/2013   Procedure: CYSTOSCOPY WITH RETROGRADE PYELOGRAM;  Surgeon: Molli Hazard, MD;  Location: [facility];  Service: Urology;  Laterality: Bilateral;   CYSTOSCOPY W/ RETROGRADES Bilateral 07/18/2016   Procedure: CYSTOSCOPY WITH RETROGRADE PYELOGRAM;  Surgeon: Alexis Frock, MD;  Location: [facility];  Service: Urology;  Laterality: Bilateral;   ENDOSCOPIC REPAIR CSF LEAK VIA NASAL PASSAGE  2011   INTRAOPERATIVE ARTERIOGRAM  CATH LAB  01-05-2009  DR Einar Gip   RICA   ACUTE ANGLE 80-85%  STENOSIS   KNEE ARTHROSCOPY Left 07/15/2017   Procedure: ARTHROSCOPY LEFT  KNEE AND DEBRIDEMENT, medial meniscal tear and chondromalaita;  Surgeon: Paralee Cancel, MD;  Location: [facility];  Service: Orthopedics;  Laterality: Left;  [place]  03-26-2012   L3 --  L5   RADIOACTIVE PROSTATE SEED IMPLANTS  08-19-2003   SEPTOPLASTY  1998   TOTAL KNEE ARTHROPLASTY Left 02/25/2018   Procedure: LEFT TOTAL KNEE ARTHROPLASTY;  Surgeon: Paralee Cancel, MD;  Location: [facility];  Service: Orthopedics;  Laterality: Left;  70 mins   TRANSURETHRAL RESECTION OF BLADDER TUMOR N/A 08/19/2013   Procedure: TRANSURETHRAL RESECTION OF BLADDER TUMOR (TURBT);  Surgeon: Molli Hazard, MD;  Location: [facility];  Service: Urology;  Laterality: N/A;   TRANSURETHRAL RESECTION OF BLADDER TUMOR N/A 07/18/2016   Procedure: TRANSURETHRAL RESECTION OF BLADDER TUMOR (TURBT);  Surgeon: Alexis Frock, MD;  Location: [facility];  Service: Urology;  Laterality: N/A;   YAG LASER APPLICATION  [id number]   Procedure: YAG LASER APPLICATION;  Surgeon: Elta Guadeloupe T. Gershon Crane, MD;  Location: [facility];  Service: Ophthalmology;  Laterality: Right;    There were no vitals filed for this visit.   Subjective Assessment - 04/13/21 0845     Subjective Pt states that he is unable to sleep on his left side;  He did sleep better last night after doing his exercises    Limitations Walking;House hold activities;Lifting;Standing    Patient Stated Goals get hip to stop hurting    Currently in Pain? Yes    Pain Score 4     Pain Location Hip    Pain Descriptors / Indicators Dull    Pain Type Chronic pain    Pain Onset More than a month ago    Pain Frequency Constant                               [facility] Adult PT Treatment/Exercise - 04/13/21 0001       Exercises   Exercises Knee/Hip      Knee/Hip Exercises: Stretches   Active Hamstring Stretch Both;3 reps;30 seconds    Piriformis Stretch Both;3 reps;30 seconds    Other Knee/Hip Stretches SKTC 3x 30 second holds    Other Knee/Hip Stretches hip excursion x 3      Knee/Hip Exercises: Standing   Other Standing Knee Exercises wall arch x 10    Other Standing Knee Exercises star gazer Lt x 3      Knee/Hip Exercises: Seated   Other Seated Knee/Hip Exercises tall sitting with scapular retraction x 10    Sit to Sand 10 reps      Knee/Hip Exercises: Supine   Bridges with Ball Squeeze Strengthening;Both;10 reps;Limitations   hold 10 seconds   Other Supine Knee/Hip Exercises heel slides pushing out as far as possible; press down each x 5      Knee/Hip Exercises: Sidelying   Hip ABduction 10 reps    Clams 10    Other Sidelying  Knee/Hip Exercises take Left leg off the bed while RT sidelying to stretch the tensor fascia lata.                      PT Short Term Goals - 04/11/21 0904       PT SHORT TERM GOAL #1   Title Patient will be independent with HEP in order to improve functional outcomes.  Time 3    Period Weeks    Status On-going    Target Date 04/17/21      PT SHORT TERM GOAL #2   Title Patient will report at least 25% improvement in symptoms for improved quality of life.    Time 3    Period Weeks    Status On-going    Target Date 04/17/21      PT SHORT TERM GOAL #3   Status On-going      PT SHORT TERM GOAL #4   Status On-going               PT Long Term Goals - 04/11/21 0904       PT LONG TERM GOAL #1   Title Patient will report at least 75% improvement in symptoms for improved quality of life.    Baseline 03/26/18: 4-117 degrees; 03/31/2018 7-120 Pt cleaned his car and was so sore that he has stiffened up a bit.     Time 6    Period Weeks    Status On-going      PT LONG TERM GOAL #2   Title Patient will improve FOTO score by at least 10 points in order to indicate improved tolerance to activity.    Time 6    Period Weeks    Status On-going      PT LONG TERM GOAL #3   Title Patient will be able to ambulate at least 325 feet in 2MWT in order to demonstrate improved gait speed for community ambulation.    Time 6    Period Weeks    Status On-going      PT LONG TERM GOAL #4   Status On-going                   Plan - 04/13/21 [id number]     Clinical Impression Statement Pt completing exercises at home and able to note improvement.  Added sidelying hip abduction and leg press out.  Pt improving with exercise technique.    Personal Factors and Comorbidities Age;Comorbidity 3+    Comorbidities Back pain/surgeries, BMI over 30, hx Cancer, Diabetes, High Blood  Pressure,    Examination-Activity Limitations Locomotion Level;Transfers;Stand;Stairs;Squat;Lift;Caring  for Others;Carry    Examination-Participation Restrictions Cleaning;Community Activity;Interpersonal Relationship;Laundry;Shop;Volunteer;Yard Work;Meal Prep             Patient will benefit from skilled therapeutic intervention in order to improve the following deficits and impairments:  Abnormal gait, Difficulty walking, Decreased endurance, Increased muscle spasms, Decreased activity tolerance, Pain, Decreased balance, Improper body mechanics, Decreased mobility, Decreased strength  Visit Diagnosis: Muscle weakness (generalized)  Pain in left hip  Other abnormalities of gait and mobility  Other symptoms and signs involving the musculoskeletal system     Problem List Patient Active Problem List   Diagnosis Date Noted   Obese 02/28/2018   S/P left TKA 02/25/2018   S/P total knee replacement 02/25/2018   Fever 10/09/2016   Community acquired pneumonia of left lower lobe of lung 10/09/2016   Gram negative sepsis ([place]) 02/05/2014   UTI (urinary tract infection) 02/04/2014   Sepsis ([place]) 02/04/2014   DM type 2 (diabetes mellitus, type 2) ([place]) 02/04/2014   Tobacco abuse 02/04/2014   Preop cardiovascular exam 08/05/2013   CAD (coronary artery disease) 08/05/2012   Degenerative lumbar spinal stenosis 04/03/2012   Chest pain 04/03/2012   Anemia 04/03/2012   Smoking 06/13/2011   JOINT EFFUSION, KNEE 01/11/2010   ARTHRITIS, RIGHT KNEE 12/12/2009  PAIN IN JOINT, LOWER LEG 12/12/2009   PROSTATE CANCER 11/08/2009   GOUTY ARTHRITIS, CHRONIC 11/08/2009   Overweight 11/08/2009   HYPERTENSION, BENIGN 11/08/2009   Carotid artery disease without cerebral infarction ([place]) 11/08/2009   Malignant neoplasm of bladder ([place]) 03/22/2008   Hyperlipidemia 03/22/2008   PAD (peripheral artery disease) ([place]) 03/22/2008   CONSTIPATION, CHRONIC 03/22/2008   DEGENERATIVE JOINT DISEASE 03/22/2008   OSA (obstructive sleep apnea) 03/22/2008   PROSTATE CANCER, HX OF 03/22/2008   DIVERTICULITIS,  HX OF 03/22/2008   Rayetta Humphrey, PT CLT [phone]  04/13/2021, 9:25 AM  [place], [place], [address] Phone: [phone]   Fax:  [phone]  Name: HYATT EDUARDO MRN: [id number] Date of Birth: [birth year]

## 2021-04-18 ENCOUNTER — Other Ambulatory Visit: Payer: Self-pay

## 2021-04-18 ENCOUNTER — Encounter (HOSPITAL_COMMUNITY): Payer: Self-pay

## 2021-04-18 ENCOUNTER — Ambulatory Visit (HOSPITAL_COMMUNITY): Payer: Medicare HMO | Attending: Orthopedic Surgery

## 2021-04-18 DIAGNOSIS — R29898 Other symptoms and signs involving the musculoskeletal system: Secondary | ICD-10-CM | POA: Diagnosis present

## 2021-04-18 DIAGNOSIS — M25552 Pain in left hip: Secondary | ICD-10-CM

## 2021-04-18 DIAGNOSIS — R2689 Other abnormalities of gait and mobility: Secondary | ICD-10-CM | POA: Diagnosis present

## 2021-04-18 DIAGNOSIS — M6281 Muscle weakness (generalized): Secondary | ICD-10-CM | POA: Insufficient documentation

## 2021-04-18 NOTE — Therapy (Signed)
[facility] [address] [place], [place], [address] Phone: [phone]   Fax:  [phone]  Physical Therapy Treatment  Patient Details  Name: [name] MRN: [id number] Date of Birth: Apr 21, 1946 Referring Provider (PT): Rod Can MD   Encounter Date: 04/18/2021   PT End of Session - 04/18/21 0844     Visit Number 6    Number of Visits 12    Date for PT Re-Evaluation 05/08/21    Authorization Type Aetna Medicare (no visit limit)    Progress Note Due on Visit 10    PT Start Time 0830    PT Stop Time 0912    PT Time Calculation (min) 42 min    Activity Tolerance Patient tolerated treatment well    Behavior During Therapy [facility] for tasks assessed/performed             Past Medical History:  Diagnosis Date   Bilateral renal cysts    Bladder cancer ([place]) UROLOGIST-  DR [facility]   RECURRENT 11/ 2017 --  hx turbt in 2004 and 08-19-2013   BPH without obstruction/lower urinary tract symptoms    Cervical fusion syndrome    LEFT ARM NUMBNESS--  CONTROLLED W/ GABAPENTIN   Coronary artery disease    CARDIOLOGIST-- DR Harl Bowie [name])   DDD (degenerative disc disease), cervical    DDD (degenerative disc disease), lumbar    Diverticulosis of sigmoid colon    History of acute gouty arthritis    History of adenomatous polyp of colon    2001;  2012- non-malignant leiomyoma;  04-07-2014  tubualr adenoma and hyperplastic polyp's   History of prostate cancer UROLOGIST-  DR [facility]   S/P  RADIOACTIVE SEED IMPLANTS 2004   HOH (hard of hearing)    no hearing aids   Hyperlipidemia    Hypertension    OA (osteoarthritis)    Occlusion and stenosis of carotid artery without mention of cerebral infarction CARDIOLOGIST  -- DR  [name]   LAST DUPLEX  11-23-2015  RICA  (DUPLEX DOPPLER 01-20-2016 RICA [id number] PROXIMAL) /   LICA [id number] (HAD CONSULT W/ [name] , VASCULAR SURGEON 01-20-2016)   OSA on CPAP    BiPAP   Spondylosis,  cervical    Type 2 diabetes mellitus ([place])    Wears glasses     Past Surgical History:  Procedure Laterality Date   ANTERIOR REMOVAL CAGE AND PLATE C3-C7/ CORPECTOMY C7 (FX)/  ALLOGRAFT AND FUSION C3 -- T1/  POSTERIOR DECOMPRESSION BILATERAL LAMINECTOMY C4 -- 6 & PARTIAL C3/  POSTEROLATERAL ARTHRODESIS C3 - T1  06/05/2005   APPENDECTOMY  1990'S   CATARACT EXTRACTION W/ INTRAOCULAR LENS  IMPLANT, BILATERAL  2013   CERVICAL FUSION  05/11/2005   C3  --  C7/  due to post op quadriparesis same day s/p  anterior C 4,5,6, colpectomy, decompression, removal epidural hematoma, foraminnotomy, cage and plate   COLONOSCOPY  last one 04-07-2014   CYSTOSCOPY W/ RETROGRADES Bilateral 08/19/2013   Procedure: CYSTOSCOPY WITH RETROGRADE PYELOGRAM;  Surgeon: Molli Hazard, MD;  Location: [facility];  Service: Urology;  Laterality: Bilateral;   CYSTOSCOPY W/ RETROGRADES Bilateral 07/18/2016   Procedure: CYSTOSCOPY WITH RETROGRADE PYELOGRAM;  Surgeon: Alexis Frock, MD;  Location: [facility];  Service: Urology;  Laterality: Bilateral;   ENDOSCOPIC REPAIR CSF LEAK VIA NASAL PASSAGE  2011   INTRAOPERATIVE ARTERIOGRAM  CATH LAB  01-05-2009  DR Einar Gip   RICA   ACUTE ANGLE 80-85%  STENOSIS   KNEE ARTHROSCOPY Left 07/15/2017   Procedure: ARTHROSCOPY LEFT  KNEE AND DEBRIDEMENT, medial meniscal tear and chondromalaita;  Surgeon: Paralee Cancel, MD;  [facility];  Service: Orthopedics;  Laterality: Left;  [place]  03-26-2012   L3 --  L5   RADIOACTIVE PROSTATE SEED IMPLANTS  08-19-2003   SEPTOPLASTY  1998   TOTAL KNEE ARTHROPLASTY Left 02/25/2018   Procedure: LEFT TOTAL KNEE ARTHROPLASTY;  Surgeon: Paralee Cancel, MD;  Location: [facility];  Service: Orthopedics;  Laterality: Left;  70 mins   TRANSURETHRAL RESECTION OF BLADDER TUMOR N/A 08/19/2013   Procedure: TRANSURETHRAL RESECTION OF BLADDER TUMOR (TURBT);  Surgeon: Molli Hazard, MD;  Location: [facility];  Service: Urology;  Laterality: N/A;   TRANSURETHRAL RESECTION OF BLADDER TUMOR N/A 07/18/2016   Procedure: TRANSURETHRAL RESECTION OF BLADDER TUMOR (TURBT);  Surgeon: Alexis Frock, MD;  Location: [facility];  Service: Urology;  Laterality: N/A;   YAG LASER APPLICATION  [id number]   Procedure: YAG LASER APPLICATION;  Surgeon: Elta Guadeloupe T. Gershon Crane, MD;  Location: [facility];  Service: Ophthalmology;  Laterality: Right;    There were no vitals filed for this visit.   Subjective Assessment - 04/18/21 0841     Subjective Pt stated he over did it yesterday, walked through [place] with increased soreness today.    Patient Stated Goals get hip to stop hurting    Currently in Pain? Yes    Pain Score 5     Pain Location Hip    Pain Orientation Left    Pain Descriptors / Indicators Sore    Pain Type Chronic pain    Pain Onset More than a month ago    Pain Frequency Constant    Aggravating Factors  walking    Pain Relieving Factors exercises                               [facility] Adult PT Treatment/Exercise - 04/18/21 0001       Exercises   Exercises Knee/Hip      Knee/Hip Exercises: Stretches   Active Hamstring Stretch Both;3 reps;30 seconds    ITB Stretch Left;3 reps;30 seconds    ITB Stretch Limitations take Left leg off the bed while RT sidelying to stretch the tensor fascia lata.    Piriformis Stretch Both;3 reps;30 seconds    Other Knee/Hip Stretches SKTC 3x 30 second holds    Other Knee/Hip Stretches hip excursion x 3      Knee/Hip Exercises: Standing   Other Standing Knee Exercises wall arch x 10    Other Standing Knee Exercises sidestep 2RT down hallway cueing to reduce ER      Knee/Hip Exercises: Seated   Other Seated Knee/Hip Exercises tall sitting with scapular retraction RTB 10x 5" holds    Sit to [employer] 10 reps      Knee/Hip Exercises: Supine   Bridges with [place] Squeeze Strengthening;Both;10 reps;Limitations   10" holds      Knee/Hip Exercises: Sidelying   Hip ABduction 10 reps    Other Sidelying Knee/Hip Exercises take Left leg off the bed while RT sidelying to stretch the tensor fascia lata.                      PT Short Term Goals - 04/11/21 0904       PT SHORT TERM GOAL #1   Title  Patient will be independent with HEP in order to improve functional outcomes.    Time 3    Period Weeks    Status On-going    Target Date 04/17/21      PT SHORT TERM GOAL #2   Title Patient will report at least 25% improvement in symptoms for improved quality of life.    Time 3    Period Weeks    Status On-going    Target Date 04/17/21      PT SHORT TERM GOAL #3   Status On-going      PT SHORT TERM GOAL #4   Status On-going               PT Long Term Goals - 04/11/21 0904       PT LONG TERM GOAL #1   Title Patient will report at least 75% improvement in symptoms for improved quality of life.    Baseline 03/26/18: 4-117 degrees; 03/31/2018 7-120 Pt cleaned his car and was so sore that he has stiffened up a bit.     Time 6    Period Weeks    Status On-going      PT LONG TERM GOAL #2   Title Patient will improve FOTO score by at least 10 points in order to indicate improved tolerance to activity.    Time 6    Period Weeks    Status On-going      PT LONG TERM GOAL #3   Title Patient will be able to ambulate at least 325 feet in 2MWT in order to demonstrate improved gait speed for community ambulation.    Time 6    Period Weeks    Status On-going      PT LONG TERM GOAL #4   Status On-going                   Plan - 04/18/21 0907     Clinical Impression Statement Added sidestepping for gluteal strengthening and to improve SLS, cueing to reduce ER and improve posture through session.    Personal Factors and Comorbidities Age;Comorbidity 3+    Comorbidities Back pain/surgeries, BMI over 30, hx Cancer, Diabetes, High Blood  Pressure,    Examination-Activity Limitations Locomotion  Level;Transfers;Stand;Stairs;Squat;Lift;Caring for Others;Carry    Examination-Participation Restrictions Cleaning;Community Activity;Interpersonal Relationship;Laundry;Shop;Volunteer;Yard Work;Meal Prep    Stability/Clinical Decision Making Stable/Uncomplicated    Clinical Decision Making Low    Rehab Potential Good    PT Frequency 2x / week    PT Duration 6 weeks    PT Treatment/Interventions ADLs/Self Care Home Management;Aquatic Therapy;Cryotherapy;Electrical Stimulation;Iontophoresis '4mg'$ /ml Dexamethasone;Moist Heat;Traction;Ultrasound;Parrafin;DME Instruction;Gait training;Stair training;Functional mobility training;Therapeutic activities;Therapeutic exercise;Balance training;Neuromuscular re-education;Cognitive remediation;Patient/family education;Orthotic Fit/Training;Manual techniques;Manual lymph drainage;Compression bandaging;Dry needling;Energy conservation;Splinting;Taping    PT Next Visit Plan core and hip strength with emphasis on glutes    PT Home Exercise Plan bridge, clam 7/19 STS, hip extension 7/21 standing hip extension, 7/24 Added hip excursion, piriformis stretch and good sitting posture with scapular retraction    Consulted and Agree with Plan of Care Patient             Patient will benefit from skilled therapeutic intervention in order to improve the following deficits and impairments:  Abnormal gait, Difficulty walking, Decreased endurance, Increased muscle spasms, Decreased activity tolerance, Pain, Decreased balance, Improper body mechanics, Decreased mobility, Decreased strength  Visit Diagnosis: Muscle weakness (generalized)  Pain in left hip  Other abnormalities of gait and mobility  Other symptoms and signs involving the musculoskeletal  system     Problem List Patient Active Problem List   Diagnosis Date Noted   Obese 02/28/2018   S/P left TKA 02/25/2018   S/P total knee replacement 02/25/2018   Fever 10/09/2016   Community acquired pneumonia of  left lower lobe of lung 10/09/2016   Gram negative sepsis ([place]) 02/05/2014   UTI (urinary tract infection) 02/04/2014   Sepsis ([place]) 02/04/2014   DM type 2 (diabetes mellitus, type 2) ([place]) 02/04/2014   Tobacco abuse 02/04/2014   Preop cardiovascular exam 08/05/2013   CAD (coronary artery disease) 08/05/2012   Degenerative lumbar spinal stenosis 04/03/2012   Chest pain 04/03/2012   Anemia 04/03/2012   Smoking 06/13/2011   JOINT EFFUSION, KNEE 01/11/2010   ARTHRITIS, RIGHT KNEE 12/12/2009   PAIN IN JOINT, LOWER LEG 12/12/2009   PROSTATE CANCER 11/08/2009   GOUTY ARTHRITIS, CHRONIC 11/08/2009   Overweight 11/08/2009   HYPERTENSION, BENIGN 11/08/2009   Carotid artery disease without cerebral infarction ([place]) 11/08/2009   Malignant neoplasm of bladder ([place]) 03/22/2008   Hyperlipidemia 03/22/2008   PAD (peripheral artery disease) ([place]) 03/22/2008   CONSTIPATION, CHRONIC 03/22/2008   DEGENERATIVE JOINT DISEASE 03/22/2008   OSA (obstructive sleep apnea) 03/22/2008   PROSTATE CANCER, HX OF 03/22/2008   DIVERTICULITIS, HX OF 03/22/2008   Ihor Austin, LPTA/CLT; CBIS [phone]  Aldona Lento 04/18/2021, 9:54 AM  [place], [place], [address] Phone: [phone]   Fax:  [phone]  Name: [name] MRN: [id number] Date of Birth: 06-Dec-1945

## 2021-04-20 ENCOUNTER — Other Ambulatory Visit: Payer: Self-pay

## 2021-04-20 ENCOUNTER — Ambulatory Visit (HOSPITAL_COMMUNITY): Payer: Medicare HMO | Admitting: Physical Therapy

## 2021-04-20 DIAGNOSIS — M6281 Muscle weakness (generalized): Secondary | ICD-10-CM

## 2021-04-20 DIAGNOSIS — R29898 Other symptoms and signs involving the musculoskeletal system: Secondary | ICD-10-CM

## 2021-04-20 DIAGNOSIS — R2689 Other abnormalities of gait and mobility: Secondary | ICD-10-CM

## 2021-04-20 DIAGNOSIS — M25552 Pain in left hip: Secondary | ICD-10-CM

## 2021-04-20 NOTE — Therapy (Signed)
[name], [place], [address] Phone: [phone]   Fax:  [phone]  Physical Therapy Treatment  Patient Details  Name: [name] MRN: [id number] Date of Birth: Feb 18, 1946 Referring Provider (PT): Rod Can MD   Encounter Date: 04/20/2021   PT End of Session - 04/20/21 0859     Visit Number 7    Number of Visits 12    Date for PT Re-Evaluation 05/08/21    Authorization Type Aetna Medicare (no visit limit)    Progress Note Due on Visit 10    PT Start Time 0830    PT Stop Time 0910    PT Time Calculation (min) 40 min             Past Medical History:  Diagnosis Date   Bilateral renal cysts    Bladder cancer ([place]) UROLOGIST-  DR [facility]   RECURRENT 11/ 2017 --  hx turbt in 2004 and 08-19-2013   BPH without obstruction/lower urinary tract symptoms    Cervical fusion syndrome    LEFT ARM NUMBNESS--  CONTROLLED W/ GABAPENTIN   Coronary artery disease    CARDIOLOGIST-- DR Harl Bowie [name])   DDD (degenerative disc disease), cervical    DDD (degenerative disc disease), lumbar    Diverticulosis of sigmoid colon    History of acute gouty arthritis    History of adenomatous polyp of colon    2001;  2012- non-malignant leiomyoma;  04-07-2014  tubualr adenoma and hyperplastic polyp's   History of prostate cancer UROLOGIST-  DR [facility]   S/P  RADIOACTIVE SEED IMPLANTS 2004   HOH (hard of hearing)    no hearing aids   Hyperlipidemia    Hypertension    OA (osteoarthritis)    Occlusion and stenosis of carotid artery without mention of cerebral infarction CARDIOLOGIST  -- DR  [name]   LAST DUPLEX  11-23-2015  RICA  (DUPLEX DOPPLER 01-20-2016 RICA [id number] PROXIMAL) /   LICA [id number] (HAD CONSULT W/ [name] , VASCULAR SURGEON 01-20-2016)   OSA on CPAP    BiPAP   Spondylosis, cervical    Type 2 diabetes mellitus ([place])    Wears glasses     Past Surgical History:  Procedure Laterality Date    ANTERIOR REMOVAL CAGE AND PLATE C3-C7/ CORPECTOMY C7 (FX)/  ALLOGRAFT AND FUSION C3 -- T1/  POSTERIOR DECOMPRESSION BILATERAL LAMINECTOMY C4 -- 6 & PARTIAL C3/  POSTEROLATERAL ARTHRODESIS C3 - T1  06/05/2005   APPENDECTOMY  1990'S   CATARACT EXTRACTION W/ INTRAOCULAR LENS  IMPLANT, BILATERAL  2013   CERVICAL FUSION  05/11/2005   C3  --  C7/  due to post op quadriparesis same day s/p  anterior C 4,5,6, colpectomy, decompression, removal epidural hematoma, foraminnotomy, cage and plate   COLONOSCOPY  last one 04-07-2014   CYSTOSCOPY W/ RETROGRADES Bilateral 08/19/2013   Procedure: CYSTOSCOPY WITH RETROGRADE PYELOGRAM;  Surgeon: Molli Hazard, MD;  Location[facility];  Service: Urology;  Laterality: Bilateral;   CYSTOSCOPY W/ RETROGRADES Bilateral 07/18/2016   Procedure: CYSTOSCOPY WITH RETROGRADE PYELOGRAM;  Surgeon: Alexis Frock, MD;  Location: [facility];  Service: Urology;  Laterality: Bilateral;   ENDOSCOPIC REPAIR CSF LEAK VIA NASAL PASSAGE  2011   INTRAOPERATIVE ARTERIOGRAM  CATH LAB  01-05-2009  DR Einar Gip   RICA   ACUTE ANGLE 80-85% STENOSIS   KNEE ARTHROSCOPY Left 07/15/2017   Procedure: ARTHROSCOPY LEFT  KNEE AND DEBRIDEMENT, medial meniscal tear  and chondromalaita;  Surgeon: Paralee Cancel, MD;  Location: [facility];  Service: Orthopedics;  Laterality: Left;  [place]  03-26-2012   L3 --  L5   RADIOACTIVE PROSTATE SEED IMPLANTS  08-19-2003   SEPTOPLASTY  1998   TOTAL KNEE ARTHROPLASTY Left 02/25/2018   Procedure: LEFT TOTAL KNEE ARTHROPLASTY;  Surgeon: Paralee Cancel, MD;  Location: [facility];  Service: Orthopedics;  Laterality: Left;  70 mins   TRANSURETHRAL RESECTION OF BLADDER TUMOR N/A 08/19/2013   Procedure: TRANSURETHRAL RESECTION OF BLADDER TUMOR (TURBT);  Surgeon: Molli Hazard, MD;  Location: [facility];  Service: Urology;  Laterality: N/A;   TRANSURETHRAL RESECTION OF BLADDER TUMOR N/A 07/18/2016    Procedure: TRANSURETHRAL RESECTION OF BLADDER TUMOR (TURBT);  Surgeon: Alexis Frock, MD;  Location[facility];  Service: Urology;  Laterality: N/A;   YAG LASER APPLICATION  [id number]   Procedure: YAG LASER APPLICATION;  Surgeon: Elta Guadeloupe T. Gershon Crane, MD;  Location: [facility];  Service: Ophthalmology;  Laterality: Right;    There were no vitals filed for this visit.   Subjective Assessment - 04/20/21 0831     Subjective Pt states that he was pulling on a large hose this morning and is very sore.    Limitations Walking;House hold activities;Lifting;Standing    Patient Stated Goals get hip to stop hurting    Currently in Pain? Yes    Pain Score 8     Pain Location Hip    Pain Orientation Left    Pain Descriptors / Indicators Sore    Pain Type Chronic pain    Pain Radiating Towards buttock    Pain Onset More than a month ago    Pain Frequency Constant    Aggravating Factors  walking    Pain Relieving Factors not sure                               [facility] Adult PT Treatment/Exercise - 04/20/21 0001       Exercises   Exercises Lumbar;Knee/Hip      Lumbar Exercises: Stretches   Single Knee to Chest Stretch Left;3 reps;30 seconds    Prone on Elbows Stretch 5 reps;20 seconds    Quad Stretch 3 reps;20 seconds    ITB Stretch Left;3 reps;20 seconds    Other Lumbar Stretch Exercise wall arch;  standing at wall getting into good posture and holding x 15 seconds      Lumbar Exercises: Prone   Other Prone Lumbar Exercises glut set ; heel squeeze x 10      Knee/Hip Exercises: Stretches   Active Hamstring Stretch Both;3 reps;30 seconds    ITB Stretch Left;3 reps;30 seconds    Piriformis Stretch Both;3 reps;30 seconds    Other Knee/Hip Stretches hip excursion x 3      Knee/Hip Exercises: Sidelying   Hip ABduction 10 reps    Clams 10    Other Sidelying Knee/Hip Exercises take Left leg off the bed while RT sidelying to stretch the tensor fascia lata.       Knee/Hip Exercises: Prone   Hip Extension Left;10 reps      Manual Therapy   Manual Therapy Soft tissue mobilization    Manual therapy comments done seperate from all other aspects of treatment    Soft tissue mobilization to loosen ITBband and lumbar mm which are both very tight, efflurage/pettrisage techniques used.  PT Short Term Goals - 04/11/21 0904       PT SHORT TERM GOAL #1   Title Patient will be independent with HEP in order to improve functional outcomes.    Time 3    Period Weeks    Status On-going    Target Date 04/17/21      PT SHORT TERM GOAL #2   Title Patient will report at least 25% improvement in symptoms for improved quality of life.    Time 3    Period Weeks    Status On-going    Target Date 04/17/21      PT SHORT TERM GOAL #3   Status On-going      PT SHORT TERM GOAL #4   Status On-going               PT Long Term Goals - 04/11/21 0904       PT LONG TERM GOAL #1   Title Patient will report at least 75% improvement in symptoms for improved quality of life.    Baseline 03/26/18: 4-117 degrees; 03/31/2018 7-120 Pt cleaned his car and was so sore that he has stiffened up a bit.     Time 6    Period Weeks    Status On-going      PT LONG TERM GOAL #2   Title Patient will improve FOTO score by at least 10 points in order to indicate improved tolerance to activity.    Time 6    Period Weeks    Status On-going      PT LONG TERM GOAL #3   Title Patient will be able to ambulate at least 325 feet in 2MWT in order to demonstrate improved gait speed for community ambulation.    Time 6    Period Weeks    Status On-going      PT LONG TERM GOAL #4   Status On-going                   Plan - 04/20/21 0900     Clinical Impression Statement Pt aggrevated due to increased activity.  PT treatment focused on stretching and proper posture with minimal strenthening this session to decrease pain. Pain decreased from  an 8 to a 5/10   Personal Factors and Comorbidities Age;Comorbidity 3+    Comorbidities Back pain/surgeries, BMI over 30, hx Cancer, Diabetes, High Blood  Pressure,    Examination-Activity Limitations Locomotion Level;Transfers;Stand;Stairs;Squat;Lift;Caring for Others;Carry    Examination-Participation Restrictions Cleaning;Community Activity;Interpersonal Relationship;Laundry;Shop;Volunteer;Yard Work;Meal Prep    Stability/Clinical Decision Making Stable/Uncomplicated    Rehab Potential Good    PT Frequency 2x / week    PT Duration 6 weeks    PT Treatment/Interventions ADLs/Self Care Home Management;Aquatic Therapy;Cryotherapy;Electrical Stimulation;Iontophoresis '4mg'$ /ml Dexamethasone;Moist Heat;Traction;Ultrasound;Parrafin;DME Instruction;Gait training;Stair training;Functional mobility training;Therapeutic activities;Therapeutic exercise;Balance training;Neuromuscular re-education;Cognitive remediation;Patient/family education;Orthotic Fit/Training;Manual techniques;Manual lymph drainage;Compression bandaging;Dry needling;Energy conservation;Splinting;Taping    PT Next Visit Plan educate on body mechanics for lifting and squatting, continues core and hip strength with emphasis on glutes    PT Home Exercise Plan bridge, clam 7/19 STS, hip extension 7/21 standing hip extension, 7/24 Added hip excursion, piriformis stretch and good sitting posture with scapular retraction    Consulted and Agree with Plan of Care Patient             Patient will benefit from skilled therapeutic intervention in order to improve the following deficits and impairments:  Abnormal gait, Difficulty walking, Decreased endurance, Increased muscle spasms, Decreased activity tolerance, Pain,  Decreased balance, Improper body mechanics, Decreased mobility, Decreased strength  Visit Diagnosis: Muscle weakness (generalized)  Pain in left hip  Other abnormalities of gait and mobility  Other symptoms and signs involving  the musculoskeletal system     Problem List Patient Active Problem List   Diagnosis Date Noted   Obese 02/28/2018   S/P left TKA 02/25/2018   S/P total knee replacement 02/25/2018   Fever 10/09/2016   Community acquired pneumonia of left lower lobe of lung 10/09/2016   Gram negative sepsis ([place]) 02/05/2014   UTI (urinary tract infection) 02/04/2014   Sepsis ([place]) 02/04/2014   DM type 2 (diabetes mellitus, type 2) ([place]) 02/04/2014   Tobacco abuse 02/04/2014   Preop cardiovascular exam 08/05/2013   CAD (coronary artery disease) 08/05/2012   Degenerative lumbar spinal stenosis 04/03/2012   Chest pain 04/03/2012   Anemia 04/03/2012   Smoking 06/13/2011   JOINT EFFUSION, KNEE 01/11/2010   ARTHRITIS, RIGHT KNEE 12/12/2009   PAIN IN JOINT, LOWER LEG 12/12/2009   PROSTATE CANCER 11/08/2009   GOUTY ARTHRITIS, CHRONIC 11/08/2009   Overweight 11/08/2009   HYPERTENSION, BENIGN 11/08/2009   Carotid artery disease without cerebral infarction ([place]) 11/08/2009   Malignant neoplasm of bladder ([place]) 03/22/2008   Hyperlipidemia 03/22/2008   PAD (peripheral artery disease) ([place]) 03/22/2008   CONSTIPATION, CHRONIC 03/22/2008   DEGENERATIVE JOINT DISEASE 03/22/2008   OSA (obstructive sleep apnea) 03/22/2008   PROSTATE CANCER, HX OF 03/22/2008   DIVERTICULITIS, HX OF 03/22/2008   Rayetta Humphrey, PT CLT [phone]    04/20/2021, 9:10 AM  [place], [place], [address] Phone: [phone]   Fax:  [phone]  Name: TYMEL KOPACZ MRN: [id number] Date of Birth: [birth year]

## 2021-04-25 ENCOUNTER — Ambulatory Visit (HOSPITAL_COMMUNITY): Payer: Medicare HMO

## 2021-04-25 ENCOUNTER — Other Ambulatory Visit: Payer: Self-pay

## 2021-04-25 DIAGNOSIS — M6281 Muscle weakness (generalized): Secondary | ICD-10-CM | POA: Diagnosis not present

## 2021-04-25 DIAGNOSIS — M25552 Pain in left hip: Secondary | ICD-10-CM

## 2021-04-25 DIAGNOSIS — R2689 Other abnormalities of gait and mobility: Secondary | ICD-10-CM

## 2021-04-25 DIAGNOSIS — R29898 Other symptoms and signs involving the musculoskeletal system: Secondary | ICD-10-CM

## 2021-04-25 NOTE — Therapy (Signed)
[facility] [address] [place], [place], [address] Phone: [phone]   Fax:  [phone]  Physical Therapy Treatment  Patient Details  Name: KAYMAN STOLZENBURG MRN: [id number] Date of Birth: 05/27/1946 Referring Provider (PT): Rod Can MD   Encounter Date: 04/25/2021   PT End of Session - 04/25/21 0841     Visit Number 8    Number of Visits 12    Date for PT Re-Evaluation 05/08/21    Authorization Type Aetna Medicare (no visit limit)    Progress Note Due on Visit 10    PT Start Time 0828    PT Stop Time 0910    PT Time Calculation (min) 42 min    Activity Tolerance Patient tolerated treatment well    Behavior During Therapy [facility] for tasks assessed/performed             Past Medical History:  Diagnosis Date   Bilateral renal cysts    Bladder cancer ([place]) UROLOGIST-  DR [facility]   RECURRENT 11/ 2017 --  hx turbt in 2004 and 08-19-2013   BPH without obstruction/lower urinary tract symptoms    Cervical fusion syndrome    LEFT ARM NUMBNESS--  CONTROLLED W/ GABAPENTIN   Coronary artery disease    CARDIOLOGIST-- DR Harl Bowie [name])   DDD (degenerative disc disease), cervical    DDD (degenerative disc disease), lumbar    Diverticulosis of sigmoid colon    History of acute gouty arthritis    History of adenomatous polyp of colon    2001;  2012- non-malignant leiomyoma;  04-07-2014  tubualr adenoma and hyperplastic polyp's   History of prostate cancer UROLOGIST-  DR [facility]   S/P  RADIOACTIVE SEED IMPLANTS 2004   HOH (hard of hearing)    no hearing aids   Hyperlipidemia    Hypertension    OA (osteoarthritis)    Occlusion and stenosis of carotid artery without mention of cerebral infarction CARDIOLOGIST  -- DR  [name]   LAST DUPLEX  11-23-2015  RICA  (DUPLEX DOPPLER 01-20-2016 RICA [id number] PROXIMAL) /   LICA [id number] (HAD CONSULT W/ [name] , VASCULAR SURGEON 01-20-2016)   OSA on CPAP    BiPAP   Spondylosis,  cervical    Type 2 diabetes mellitus ([place])    Wears glasses     Past Surgical History:  Procedure Laterality Date   ANTERIOR REMOVAL CAGE AND PLATE C3-C7/ CORPECTOMY C7 (FX)/  ALLOGRAFT AND FUSION C3 -- T1/  POSTERIOR DECOMPRESSION BILATERAL LAMINECTOMY C4 -- 6 & PARTIAL C3/  POSTEROLATERAL ARTHRODESIS C3 - T1  06/05/2005   APPENDECTOMY  1990'S   CATARACT EXTRACTION W/ INTRAOCULAR LENS  IMPLANT, BILATERAL  2013   CERVICAL FUSION  05/11/2005   C3  --  C7/  due to post op quadriparesis same day s/p  anterior C 4,5,6, colpectomy, decompression, removal epidural hematoma, foraminnotomy, cage and plate   COLONOSCOPY  last one 04-07-2014   CYSTOSCOPY W/ RETROGRADES Bilateral 08/19/2013   Procedure: CYSTOSCOPY WITH RETROGRADE PYELOGRAM;  Surgeon: Molli Hazard, MD;  Location: [facility];  Service: Urology;  Laterality: Bilateral;   CYSTOSCOPY W/ RETROGRADES Bilateral 07/18/2016   Procedure: CYSTOSCOPY WITH RETROGRADE PYELOGRAM;  Surgeon: Alexis Frock, MD;  Location[facility];  Service: Urology;  Laterality: Bilateral;   ENDOSCOPIC REPAIR CSF LEAK VIA NASAL PASSAGE  2011   INTRAOPERATIVE ARTERIOGRAM  CATH LAB  01-05-2009  DR Einar Gip   RICA   ACUTE ANGLE 80-85%  STENOSIS   KNEE ARTHROSCOPY Left 07/15/2017   Procedure: ARTHROSCOPY LEFT  KNEE AND DEBRIDEMENT, medial meniscal tear and chondromalaita;  Surgeon: Paralee Cancel, MD;  Location[facility];  Service: Orthopedics;  Laterality: Left;  [place]  03-26-2012   L3 --  L5   RADIOACTIVE PROSTATE SEED IMPLANTS  08-19-2003   SEPTOPLASTY  1998   TOTAL KNEE ARTHROPLASTY Left 02/25/2018   Procedure: LEFT TOTAL KNEE ARTHROPLASTY;  Surgeon: Paralee Cancel, MD;  Location: [facility];  Service: Orthopedics;  Laterality: Left;  70 mins   TRANSURETHRAL RESECTION OF BLADDER TUMOR N/A 08/19/2013   Procedure: TRANSURETHRAL RESECTION OF BLADDER TUMOR (TURBT);  Surgeon: Molli Hazard, MD;  [facility];  Service: Urology;  Laterality: N/A;   TRANSURETHRAL RESECTION OF BLADDER TUMOR N/A 07/18/2016   Procedure: TRANSURETHRAL RESECTION OF BLADDER TUMOR (TURBT);  Surgeon: Alexis Frock, MD;  Location: [facility];  Service: Urology;  Laterality: N/A;   YAG LASER APPLICATION  [id number]   Procedure: YAG LASER APPLICATION;  Surgeon: Elta Guadeloupe T. Gershon Crane, MD;  Location: [facility];  Service: Ophthalmology;  Laterality: Right;    There were no vitals filed for this visit.   Subjective Assessment - 04/25/21 0832     Subjective Pt reports increased soreness today, stated he had to push wife around [facility].,    Patient Stated Goals get hip to stop hurting    Currently in Pain? Yes    Pain Score 5     Pain Location Hip    Pain Orientation Left    Pain Descriptors / Indicators Sore    Pain Type Chronic pain    Pain Radiating Towards buttock    Pain Onset More than a month ago    Pain Frequency Constant    Aggravating Factors  walking    Pain Relieving Factors not sure    Effect of Pain on Daily Activities limits                               [facility] Adult PT Treatment/Exercise - 04/25/21 0001       Lumbar Exercises: Stretches   Active Hamstring Stretch 3 reps;30 seconds    Active Hamstring Stretch Limitations supine with hands behind knee    Single Knee to Chest Stretch Left;3 reps;30 seconds    Prone on Elbows Stretch Limitations 2 minutes    Quad Stretch 3 reps;20 seconds    Quad Stretch Limitations manual during massage    Other Lumbar Stretch Exercise wall arch;  standing at wall getting into good posture and holding x 15 seconds      Lumbar Exercises: Seated   Other Seated Lumbar Exercises Wback 10x 5"      Knee/Hip Exercises: Stretches   Active Hamstring Stretch Both;3 reps;30 seconds    ITB Stretch Left;3 reps;30 seconds    ITB Stretch Limitations take Left leg off the bed while RT sidelying to stretch the tensor fascia  lata.    Piriformis Stretch Both;3 reps;30 seconds      Knee/Hip Exercises: Standing   Functional Squat 10 reps    Functional Squat Limitations 3D hip excursion, cueing for mechanics    Other Standing Knee Exercises wall arch x 10    Other Standing Knee Exercises sidestep 2RT down hallway cueing to reduce ER      Knee/Hip Exercises: Seated   Other Seated Knee/Hip Exercises wback  Sit to Sand 10 reps;without UE support   eccentric control     Knee/Hip Exercises: Sidelying   Hip ABduction 10 reps    Hip ABduction Limitations cueing to reduce flexion    Other Sidelying Knee/Hip Exercises take Left leg off the bed while RT sidelying to stretch the tensor fascia lata.      Manual Therapy   Manual Therapy Soft tissue mobilization    Manual therapy comments done seperate from all other aspects of treatment    Soft tissue mobilization to loosen ITBband and lumbar mm which are both very tight, efflurage/pettrisage techniques used.                      PT Short Term Goals - 04/11/21 0904       PT SHORT TERM GOAL #1   Title Patient will be independent with HEP in order to improve functional outcomes.    Time 3    Period Weeks    Status On-going    Target Date 04/17/21      PT SHORT TERM GOAL #2   Title Patient will report at least 25% improvement in symptoms for improved quality of life.    Time 3    Period Weeks    Status On-going    Target Date 04/17/21      PT SHORT TERM GOAL #3   Status On-going      PT SHORT TERM GOAL #4   Status On-going               PT Long Term Goals - 04/11/21 0904       PT LONG TERM GOAL #1   Title Patient will report at least 75% improvement in symptoms for improved quality of life.    Baseline 03/26/18: 4-117 degrees; 03/31/2018 7-120 Pt cleaned his car and was so sore that he has stiffened up a bit.     Time 6    Period Weeks    Status On-going      PT LONG TERM GOAL #2   Title Patient will improve FOTO score by at  least 10 points in order to indicate improved tolerance to activity.    Time 6    Period Weeks    Status On-going      PT LONG TERM GOAL #3   Title Patient will be able to ambulate at least 325 feet in 2MWT in order to demonstrate improved gait speed for community ambulation.    Time 6    Period Weeks    Status On-going      PT LONG TERM GOAL #4   Status On-going                   Plan - 04/25/21 [id number]     Clinical Impression Statement Cueing required to improve mechanics with squats as tendency to flex at back, multimodal cueing.  Added wback for postural strengthening.  Continued with gluteal stretches and manual for pain control.    Personal Factors and Comorbidities Age;Comorbidity 3+    Comorbidities Back pain/surgeries, BMI over 30, hx Cancer, Diabetes, High Blood  Pressure,    Examination-Activity Limitations Locomotion Level;Transfers;Stand;Stairs;Squat;Lift;Caring for Others;Carry    Examination-Participation Restrictions Cleaning;Community Activity;Interpersonal Relationship;Laundry;Shop;Volunteer;Yard Work;Meal Prep    Stability/Clinical Decision Making Stable/Uncomplicated    Clinical Decision Making Low    Rehab Potential Good    PT Frequency 2x / week    PT Duration 6 weeks    PT Treatment/Interventions ADLs/Self  Care Home Management;Aquatic Therapy;Cryotherapy;Electrical Stimulation;Iontophoresis '4mg'$ /ml Dexamethasone;Moist Heat;Traction;Ultrasound;Parrafin;DME Instruction;Gait training;Stair training;Functional mobility training;Therapeutic activities;Therapeutic exercise;Balance training;Neuromuscular re-education;Cognitive remediation;Patient/family education;Orthotic Fit/Training;Manual techniques;Manual lymph drainage;Compression bandaging;Dry needling;Energy conservation;Splinting;Taping    PT Next Visit Plan educate on body mechanics for lifting and squatting, continues core and hip strength with emphasis on glutes    PT Home Exercise Plan bridge, clam 7/19  STS, hip extension 7/21 standing hip extension, 7/24 Added hip excursion, piriformis stretch and good sitting posture with scapular retraction    Consulted and Agree with Plan of Care Patient             Patient will benefit from skilled therapeutic intervention in order to improve the following deficits and impairments:  Abnormal gait, Difficulty walking, Decreased endurance, Increased muscle spasms, Decreased activity tolerance, Pain, Decreased balance, Improper body mechanics, Decreased mobility, Decreased strength  Visit Diagnosis: Muscle weakness (generalized)  Pain in left hip  Other abnormalities of gait and mobility  Other symptoms and signs involving the musculoskeletal system     Problem List Patient Active Problem List   Diagnosis Date Noted   Obese 02/28/2018   S/P left TKA 02/25/2018   S/P total knee replacement 02/25/2018   Fever 10/09/2016   Community acquired pneumonia of left lower lobe of lung 10/09/2016   Gram negative sepsis ([place]) 02/05/2014   UTI (urinary tract infection) 02/04/2014   Sepsis ([place]) 02/04/2014   DM type 2 (diabetes mellitus, type 2) ([place]) 02/04/2014   Tobacco abuse 02/04/2014   Preop cardiovascular exam 08/05/2013   CAD (coronary artery disease) 08/05/2012   Degenerative lumbar spinal stenosis 04/03/2012   Chest pain 04/03/2012   Anemia 04/03/2012   Smoking 06/13/2011   JOINT EFFUSION, KNEE 01/11/2010   ARTHRITIS, RIGHT KNEE 12/12/2009   PAIN IN JOINT, LOWER LEG 12/12/2009   PROSTATE CANCER 11/08/2009   GOUTY ARTHRITIS, CHRONIC 11/08/2009   Overweight 11/08/2009   HYPERTENSION, BENIGN 11/08/2009   Carotid artery disease without cerebral infarction ([place]) 11/08/2009   Malignant neoplasm of bladder ([place]) 03/22/2008   Hyperlipidemia 03/22/2008   PAD (peripheral artery disease) ([place]) 03/22/2008   CONSTIPATION, CHRONIC 03/22/2008   DEGENERATIVE JOINT DISEASE 03/22/2008   OSA (obstructive sleep apnea) 03/22/2008   PROSTATE CANCER,  HX OF 03/22/2008   DIVERTICULITIS, HX OF 03/22/2008   Ihor Austin, LPTA/CLT; CBIS [phone]  Aldona Lento 04/25/2021, 9:13 AM  [facility] [address] [place], [place], [address] Phone: [phone]   Fax:  [phone]  Name: MAKSYM LAUBENSTEIN MRN: [id number] Date of Birth: 08-May-1946

## 2021-04-27 ENCOUNTER — Encounter (HOSPITAL_COMMUNITY): Payer: Self-pay

## 2021-04-27 ENCOUNTER — Ambulatory Visit (HOSPITAL_COMMUNITY): Payer: Medicare HMO

## 2021-04-27 ENCOUNTER — Other Ambulatory Visit: Payer: Self-pay

## 2021-04-27 DIAGNOSIS — M25552 Pain in left hip: Secondary | ICD-10-CM

## 2021-04-27 DIAGNOSIS — R2689 Other abnormalities of gait and mobility: Secondary | ICD-10-CM

## 2021-04-27 DIAGNOSIS — M6281 Muscle weakness (generalized): Secondary | ICD-10-CM | POA: Diagnosis not present

## 2021-04-27 DIAGNOSIS — R29898 Other symptoms and signs involving the musculoskeletal system: Secondary | ICD-10-CM

## 2021-04-27 NOTE — Therapy (Signed)
[facility] [address] [place], [place], [address] Phone: [phone]   Fax:  [phone]  Physical Therapy Treatment  Patient Details  Name: [name] MRN: [id number] Date of Birth: [birth year] Referring Provider (PT): Rod Can MD   Encounter Date: 04/27/2021   PT End of Session - 04/27/21 0928     Visit Number 9    Number of Visits 12    Date for PT Re-Evaluation 05/08/21    Authorization Type [employer] (no visit limit)    Progress Note Due on Visit 10    PT Start Time 0918    PT Stop Time 0958    PT Time Calculation (min) 40 min    Activity Tolerance Patient tolerated treatment well    Behavior During Therapy [facility] for tasks assessed/performed             Past Medical History:  Diagnosis Date   Bilateral renal cysts    Bladder cancer ([place]) UROLOGIST-  DR [facility]   RECURRENT 11/ 2017 --  hx turbt in 2004 and 08-19-2013   BPH without obstruction/lower urinary tract symptoms    Cervical fusion syndrome    LEFT ARM NUMBNESS--  CONTROLLED W/ GABAPENTIN   Coronary artery disease    CARDIOLOGIST-- DR Harl Bowie [name])   DDD (degenerative disc disease), cervical    DDD (degenerative disc disease), lumbar    Diverticulosis of sigmoid colon    History of acute gouty arthritis    History of adenomatous polyp of colon    2001;  2012- non-malignant leiomyoma;  04-07-2014  tubualr adenoma and hyperplastic polyp's   History of prostate cancer UROLOGIST-  DR [facility]   S/P  RADIOACTIVE SEED IMPLANTS 2004   HOH (hard of hearing)    no hearing aids   Hyperlipidemia    Hypertension    OA (osteoarthritis)    Occlusion and stenosis of carotid artery without mention of cerebral infarction CARDIOLOGIST  -- DR  [name]   LAST DUPLEX  11-23-2015  RICA  (DUPLEX DOPPLER 01-20-2016 RICA [id number] PROXIMAL) /   LICA [id number] (HAD CONSULT W/ [name] , VASCULAR SURGEON 01-20-2016)   OSA on CPAP    BiPAP   Spondylosis,  cervical    Type 2 diabetes mellitus ([place])    Wears glasses     Past Surgical History:  Procedure Laterality Date   ANTERIOR REMOVAL CAGE AND PLATE C3-C7/ CORPECTOMY C7 (FX)/  ALLOGRAFT AND FUSION C3 -- T1/  POSTERIOR DECOMPRESSION BILATERAL LAMINECTOMY C4 -- 6 & PARTIAL C3/  POSTEROLATERAL ARTHRODESIS C3 - T1  06/05/2005   APPENDECTOMY  1990'S   CATARACT EXTRACTION W/ INTRAOCULAR LENS  IMPLANT, BILATERAL  2013   CERVICAL FUSION  05/11/2005   C3  --  C7/  due to post op quadriparesis same day s/p  anterior C 4,5,6, colpectomy, decompression, removal epidural hematoma, foraminnotomy, cage and plate   COLONOSCOPY  last one 04-07-2014   CYSTOSCOPY W/ RETROGRADES Bilateral 08/19/2013   Procedure: CYSTOSCOPY WITH RETROGRADE PYELOGRAM;  Surgeon: Molli Hazard, MD;  Location: [facility];  Service: Urology;  Laterality: Bilateral;   CYSTOSCOPY W/ RETROGRADES Bilateral 07/18/2016   Procedure: CYSTOSCOPY WITH RETROGRADE PYELOGRAM;  Surgeon: Alexis Frock, MD;  Location: [facility];  Service: Urology;  Laterality: Bilateral;   ENDOSCOPIC REPAIR CSF LEAK VIA NASAL PASSAGE  2011   INTRAOPERATIVE ARTERIOGRAM  CATH LAB  01-05-2009  DR Einar Gip   RICA   ACUTE ANGLE 80-85%  STENOSIS   KNEE ARTHROSCOPY Left 07/15/2017   Procedure: ARTHROSCOPY LEFT  KNEE AND DEBRIDEMENT, medial meniscal tear and chondromalaita;  Surgeon: Paralee Cancel, MD;  Location: [facility];  Service: Orthopedics;  Laterality: Left;  [place]  03-26-2012   L3 --  L5   RADIOACTIVE PROSTATE SEED IMPLANTS  08-19-2003   SEPTOPLASTY  1998   TOTAL KNEE ARTHROPLASTY Left 02/25/2018   Procedure: LEFT TOTAL KNEE ARTHROPLASTY;  Surgeon: Paralee Cancel, MD;  Location: [facility];  Service: Orthopedics;  Laterality: Left;  70 mins   TRANSURETHRAL RESECTION OF BLADDER TUMOR N/A 08/19/2013   Procedure: TRANSURETHRAL RESECTION OF BLADDER TUMOR (TURBT);  Surgeon: Molli Hazard, MD;  Location: [facility];  Service: Urology;  Laterality: N/A;   TRANSURETHRAL RESECTION OF BLADDER TUMOR N/A 07/18/2016   Procedure: TRANSURETHRAL RESECTION OF BLADDER TUMOR (TURBT);  Surgeon: Alexis Frock, MD;  Location: [facility];  Service: Urology;  Laterality: N/A;   YAG LASER APPLICATION  [id number]   Procedure: YAG LASER APPLICATION;  Surgeon: Elta Guadeloupe T. Gershon Crane, MD;  Location: [facility];  Service: Ophthalmology;  Laterality: Right;    There were no vitals filed for this visit.   Subjective Assessment - 04/27/21 0927     Subjective Reports he had to push wife in wheelchair around [facility] yesterday, reports increased soreness today.    Patient Stated Goals get hip to stop hurting    Currently in Pain? Yes    Pain Score 5     Pain Location Hip    Pain Orientation Left    Pain Descriptors / Indicators Sore    Pain Type Chronic pain    Pain Radiating Towards buttock    Pain Onset More than a month ago    Pain Frequency Constant    Aggravating Factors  walking, sitting    Pain Relieving Factors not sure    Effect of Pain on Daily Activities limits                               [facility] Adult PT Treatment/Exercise - 04/27/21 0001       Lumbar Exercises: Stretches   Active Hamstring Stretch 2 reps;30 seconds    Active Hamstring Stretch Limitations supine with hands behind knee    Single Knee to Chest Stretch 1 rep;30 seconds    Standing Extension 2 reps;5 reps    Prone on Elbows Stretch Limitations 2 minutes    Quad Stretch 3 reps;20 seconds    Quad Stretch Limitations manual during massage      Lumbar Exercises: Standing   Functional Squats 10 reps    Lifting From floor;5 reps    Lifting Weights (lbs) 8    Lifting Limitations cueing for mechanics and to be close to box, ab [employer] 15 reps;Theraband    Theraband Level (Row) Level 2 (Red)    Shoulder Extension 15 reps;Theraband    Theraband Level (Shoulder Extension) Level 2 (Red)     Other Standing Lumbar Exercises standing front of mirror forward gait with ab set and shoulders even    Other Standing Lumbar Exercises 3D hip excursion      Lumbar Exercises: Seated   Sit to Stand 10 reps      Lumbar Exercises: Supine   Bridge 15 reps      Knee/Hip Exercises: Sidelying   Other Sidelying Knee/Hip Exercises take Left  leg off the bed while RT sidelying to stretch the tensor fascia lata.      Manual Therapy   Manual Therapy Soft tissue mobilization    Manual therapy comments done seperate from all other aspects of treatment    Soft tissue mobilization to loosen ITBband and lumbar mm which are both very tight, efflurage/pettrisage techniques used.                      PT Short Term Goals - 04/11/21 0904       PT SHORT TERM GOAL #1   Title Patient will be independent with HEP in order to improve functional outcomes.    Time 3    Period Weeks    Status On-going    Target Date 04/17/21      PT SHORT TERM GOAL #2   Title Patient will report at least 25% improvement in symptoms for improved quality of life.    Time 3    Period Weeks    Status On-going    Target Date 04/17/21      PT SHORT TERM GOAL #3   Status On-going      PT SHORT TERM GOAL #4   Status On-going               PT Long Term Goals - 04/11/21 0904       PT LONG TERM GOAL #1   Title Patient will report at least 75% improvement in symptoms for improved quality of life.    Baseline 03/26/18: 4-117 degrees; 03/31/2018 7-120 Pt cleaned his car and was so sore that he has stiffened up a bit.     Time 6    Period Weeks    Status On-going      PT LONG TERM GOAL #2   Title Patient will improve FOTO score by at least 10 points in order to indicate improved tolerance to activity.    Time 6    Period Weeks    Status On-going      PT LONG TERM GOAL #3   Title Patient will be able to ambulate at least 325 feet in 2MWT in order to demonstrate improved gait speed for community  ambulation.    Time 6    Period Weeks    Status On-going      PT LONG TERM GOAL #4   Status On-going                   Plan - 04/27/21 0940     Clinical Impression Statement Improved mechanics with squats following cueing and demonstration, educated on proper lifting mechanics.  Added theraband for postural strengthening wiht cueing to reduce forward head and rolled shoulders and improve scapular retraction.    Personal Factors and Comorbidities Age;Comorbidity 3+    Comorbidities Back pain/surgeries, BMI over 30, hx Cancer, Diabetes, High Blood  Pressure,    Examination-Activity Limitations Locomotion Level;Transfers;Stand;Stairs;Squat;Lift;Caring for Others;Carry    Examination-Participation Restrictions Cleaning;Community Activity;Interpersonal Relationship;Laundry;Shop;Volunteer;Yard Work;Meal Prep    Stability/Clinical Decision Making Stable/Uncomplicated    Clinical Decision Making Low    Rehab Potential Good    PT Frequency 2x / week    PT Duration 6 weeks    PT Treatment/Interventions ADLs/Self Care Home Management;Aquatic Therapy;Cryotherapy;Electrical Stimulation;Iontophoresis '4mg'$ /ml Dexamethasone;Moist Heat;Traction;Ultrasound;Parrafin;DME Instruction;Gait training;Stair training;Functional mobility training;Therapeutic activities;Therapeutic exercise;Balance training;Neuromuscular re-education;Cognitive remediation;Patient/family education;Orthotic Fit/Training;Manual techniques;Manual lymph drainage;Compression bandaging;Dry needling;Energy conservation;Splinting;Taping    PT Next Visit Plan 10th session progress note.  educate on body mechanics  for lifting and squatting, continues core and hip strength with emphasis on glutes    PT Home Exercise Plan bridge, clam 7/19 STS, hip extension 7/21 standing hip extension, 7/24 Added hip excursion, piriformis stretch and good sitting posture with scapular retraction    Consulted and Agree with Plan of Care Patient              Patient will benefit from skilled therapeutic intervention in order to improve the following deficits and impairments:  Abnormal gait, Difficulty walking, Decreased endurance, Increased muscle spasms, Decreased activity tolerance, Pain, Decreased balance, Improper body mechanics, Decreased mobility, Decreased strength  Visit Diagnosis: Muscle weakness (generalized)  Pain in left hip  Other abnormalities of gait and mobility  Other symptoms and signs involving the musculoskeletal system     Problem List Patient Active Problem List   Diagnosis Date Noted   Obese 02/28/2018   S/P left TKA 02/25/2018   S/P total knee replacement 02/25/2018   Fever 10/09/2016   Community acquired pneumonia of left lower lobe of lung 10/09/2016   Gram negative sepsis ([place]) 02/05/2014   UTI (urinary tract infection) 02/04/2014   Sepsis ([place]) 02/04/2014   DM type 2 (diabetes mellitus, type 2) ([place]) 02/04/2014   Tobacco abuse 02/04/2014   Preop cardiovascular exam 08/05/2013   CAD (coronary artery disease) 08/05/2012   Degenerative lumbar spinal stenosis 04/03/2012   Chest pain 04/03/2012   Anemia 04/03/2012   Smoking 06/13/2011   JOINT EFFUSION, KNEE 01/11/2010   ARTHRITIS, RIGHT KNEE 12/12/2009   PAIN IN JOINT, LOWER LEG 12/12/2009   PROSTATE CANCER 11/08/2009   GOUTY ARTHRITIS, CHRONIC 11/08/2009   Overweight 11/08/2009   HYPERTENSION, BENIGN 11/08/2009   Carotid artery disease without cerebral infarction ([place]) 11/08/2009   Malignant neoplasm of bladder ([place]) 03/22/2008   Hyperlipidemia 03/22/2008   PAD (peripheral artery disease) ([place]) 03/22/2008   CONSTIPATION, CHRONIC 03/22/2008   DEGENERATIVE JOINT DISEASE 03/22/2008   OSA (obstructive sleep apnea) 03/22/2008   PROSTATE CANCER, HX OF 03/22/2008   DIVERTICULITIS, HX OF 03/22/2008   Ihor Austin, LPTA/CLT; CBIS [phone]  Aldona Lento 04/27/2021, 12:53 PM  [facility] [address] [place], [place], [address] Phone: [phone]   Fax:  [phone]  Name: MACLAREN ARROYAVE MRN: [id number] Date of Birth: 02/21/1946

## 2021-05-01 ENCOUNTER — Other Ambulatory Visit: Payer: Self-pay

## 2021-05-01 ENCOUNTER — Encounter (HOSPITAL_COMMUNITY): Payer: Self-pay | Admitting: Physical Therapy

## 2021-05-01 ENCOUNTER — Ambulatory Visit (HOSPITAL_COMMUNITY): Payer: Medicare HMO | Admitting: Physical Therapy

## 2021-05-01 DIAGNOSIS — R29898 Other symptoms and signs involving the musculoskeletal system: Secondary | ICD-10-CM

## 2021-05-01 DIAGNOSIS — R2689 Other abnormalities of gait and mobility: Secondary | ICD-10-CM

## 2021-05-01 DIAGNOSIS — M6281 Muscle weakness (generalized): Secondary | ICD-10-CM

## 2021-05-01 DIAGNOSIS — M25552 Pain in left hip: Secondary | ICD-10-CM

## 2021-05-01 NOTE — Patient Instructions (Signed)
Access Code: [email or web address] URL: [email or web address] Date: 05/01/2021 Prepared by: Mitzi Hansen Maxemiliano Riel  Exercises Sit to Stand with Arms Crossed - 1 x daily - 7 x weekly - 2 sets - 10 reps

## 2021-05-01 NOTE — Therapy (Signed)
[place], [place], [address] Phone: [phone]   Fax:  [phone]  Physical Therapy Treatment/Discharge Summary  Patient Details  Name: [name] MRN: [id number] Date of Birth: July 07, 1946 Referring Provider (PT): Rod Can MD   Encounter Date: 05/01/2021  PHYSICAL THERAPY DISCHARGE SUMMARY  Visits from Start of Care: 10  Current functional level related to goals / functional outcomes: See below   Remaining deficits:see below   Education / Equipment: See below   Patient agrees to discharge. Patient goals were partially met. Patient is being discharged due to meeting the stated rehab goals.    PT End of Session - 05/01/21 0743     Visit Number 10    Number of Visits 12    Date for PT Re-Evaluation 05/08/21    Authorization Type [employer] (no visit limit)    Progress Note Due on Visit 73    PT Start Time 0745    PT Stop Time 0813    PT Time Calculation (min) 28 min    Activity Tolerance Patient tolerated treatment well    Behavior During Therapy [facility] for tasks assessed/performed             Past Medical History:  Diagnosis Date   Bilateral renal cysts    Bladder cancer ([place]) UROLOGIST-  DR [facility]   RECURRENT 11/ 2017 --  hx turbt in 2004 and 08-19-2013   BPH without obstruction/lower urinary tract symptoms    Cervical fusion syndrome    LEFT ARM NUMBNESS--  CONTROLLED W/ GABAPENTIN   Coronary artery disease    CARDIOLOGIST-- DR Harl Bowie [name])   DDD (degenerative disc disease), cervical    DDD (degenerative disc disease), lumbar    Diverticulosis of sigmoid colon    History of acute gouty arthritis    History of adenomatous polyp of colon    2001;  2012- non-malignant leiomyoma;  04-07-2014  tubualr adenoma and hyperplastic polyp's   History of prostate cancer UROLOGIST-  DR [facility]   S/P  RADIOACTIVE SEED IMPLANTS 2004   HOH (hard of hearing)    no hearing aids    Hyperlipidemia    Hypertension    OA (osteoarthritis)    Occlusion and stenosis of carotid artery without mention of cerebral infarction CARDIOLOGIST  -- DR  [name]   LAST DUPLEX  11-23-2015  RICA  (DUPLEX DOPPLER 01-20-2016 RICA 82-50% PROXIMAL) /   LICA <53% (HAD CONSULT W/ [name] , VASCULAR SURGEON 01-20-2016)   OSA on CPAP    BiPAP   Spondylosis, cervical    Type 2 diabetes mellitus ([place])    Wears glasses     Past Surgical History:  Procedure Laterality Date   ANTERIOR REMOVAL CAGE AND PLATE C3-C7/ CORPECTOMY C7 (FX)/  ALLOGRAFT AND FUSION C3 -- T1/  POSTERIOR DECOMPRESSION BILATERAL LAMINECTOMY C4 -- 6 & PARTIAL C3/  POSTEROLATERAL ARTHRODESIS C3 - T1  06/05/2005   APPENDECTOMY  1990'S   CATARACT EXTRACTION W/ INTRAOCULAR LENS  IMPLANT, BILATERAL  2013   CERVICAL FUSION  05/11/2005   C3  --  C7/  due to post op quadriparesis same day s/p  anterior C 4,5,6, colpectomy, decompression, removal epidural hematoma, foraminnotomy, cage and plate   COLONOSCOPY  last one 04-07-2014   CYSTOSCOPY W/ RETROGRADES Bilateral 08/19/2013   Procedure: CYSTOSCOPY WITH RETROGRADE PYELOGRAM;  Surgeon: Molli Hazard, MD;  Location[facility];  Service: Urology;  Laterality: Bilateral;  CYSTOSCOPY W/ RETROGRADES Bilateral 07/18/2016   Procedure: CYSTOSCOPY WITH RETROGRADE PYELOGRAM;  Surgeon: Alexis Frock, MD;  Location: [facility];  Service: Urology;  Laterality: Bilateral;   ENDOSCOPIC REPAIR CSF LEAK VIA NASAL PASSAGE  2011   INTRAOPERATIVE ARTERIOGRAM  CATH LAB  01-05-2009  DR Einar Gip   RICA   ACUTE ANGLE 80-85% STENOSIS   KNEE ARTHROSCOPY Left 07/15/2017   Procedure: ARTHROSCOPY LEFT  KNEE AND DEBRIDEMENT, medial meniscal tear and chondromalaita;  Surgeon: Paralee Cancel, MD;  Location: [facility];  Service: Orthopedics;  Laterality: Left;  [place]  03-26-2012   L3 --  L5   RADIOACTIVE PROSTATE SEED IMPLANTS  08-19-2003   SEPTOPLASTY   1998   TOTAL KNEE ARTHROPLASTY Left 02/25/2018   Procedure: LEFT TOTAL KNEE ARTHROPLASTY;  Surgeon: Paralee Cancel, MD;  Location: [facility];  Service: Orthopedics;  Laterality: Left;  70 mins   TRANSURETHRAL RESECTION OF BLADDER TUMOR N/A 08/19/2013   Procedure: TRANSURETHRAL RESECTION OF BLADDER TUMOR (TURBT);  Surgeon: Molli Hazard, MD;  Location: [facility];  Service: Urology;  Laterality: N/A;   TRANSURETHRAL RESECTION OF BLADDER TUMOR N/A 07/18/2016   Procedure: TRANSURETHRAL RESECTION OF BLADDER TUMOR (TURBT);  Surgeon: Alexis Frock, MD;  Location: [facility];  Service: Urology;  Laterality: N/A;   YAG LASER APPLICATION  57/32/2025   Procedure: YAG LASER APPLICATION;  Surgeon: Elta Guadeloupe T. Gershon Crane, MD;  Location: [facility];  Service: Ophthalmology;  Laterality: Right;    There were no vitals filed for this visit.   Subjective Assessment - 05/01/21 0744     Subjective Patient states some improvement. Continued soreness with sitting/driving. It is not as bad as it was. Home exercises are going well. Patient states 40-50% improvement with PT intervention. He feels like things are at a stand still.    Patient Stated Goals get hip to stop hurting    Currently in Pain? Yes    Pain Score 4     Pain Location Hip    Pain Orientation Left    Pain Descriptors / Indicators Sore    Pain Onset More than a month ago    Pain Frequency Constant    Aggravating Factors  sitting, weather                [facility] PT Assessment - 05/01/21 0001       Assessment   Medical Diagnosis Trochanteric Bursitis of L hip    Referring Provider (PT) Rod Can MD    Onset Date/Surgical Date 02/25/21    Next MD Visit none scheduled    Prior Therapy L TKA      Precautions   Precautions None      Restrictions   Weight Bearing Restrictions No      Balance Screen   Has the patient fallen in the past 6 months No    Has the patient had a decrease in activity level because  of a fear of falling?  Yes    Is the patient reluctant to leave their home because of a fear of falling?  No      Prior Function   Level of Independence Independent    Vocation Retired      Charity fundraiser Status Within Functional Limits for tasks assessed      Observation/Other Assessments   Observations Ambulates without AD, R truncal lean and trunk flexed; seated slouched off LLE    Focus on Therapeutic Outcomes (  FOTO)  43% function      AROM   Overall AROM  Within functional limits for tasks performed      Strength   Right Hip Flexion 5/5    Right Hip Extension 4/5    Right Hip ABduction 4+/5    Left Hip Flexion 5/5    Left Hip Extension 4-/5    Left Hip ABduction 4/5      Ambulation/Gait   Ambulation/Gait Yes    Ambulation/Gait Assistance 6: Modified independent (Device/Increase time)    Ambulation Distance (Feet) 350 Feet    Assistive device None    Gait Pattern Trendelenburg    Ambulation Surface Level;Indoor    Gait velocity decreased    Gait Comments 2MWT, gradual increasing pain and trendelenburg with fatigue; 50 feet gait training with [facility]                                   PT Education - 05/01/21 0744     Education Details HEP, reassessment findings, returning to PT if needed    Person(s) Educated Patient    Methods Explanation;Handout    Comprehension Verbalized understanding              PT Short Term Goals - 05/01/21 0751       PT SHORT TERM GOAL #1   Title Patient will be independent with HEP in order to improve functional outcomes.    Time 3    Period Weeks    Status Achieved    Target Date 04/17/21      PT SHORT TERM GOAL #2   Title Patient will report at least 25% improvement in symptoms for improved quality of life.    Time 3    Period Weeks    Status Achieved    Target Date 04/17/21               PT Long Term Goals - 05/01/21 0751       PT LONG TERM GOAL #1   Title Patient will  report at least 75% improvement in symptoms for improved quality of life.    Time 6    Period Weeks    Status Not Met      PT LONG TERM GOAL #2   Title Patient will improve FOTO score by at least 10 points in order to indicate improved tolerance to activity.    Time 6    Period Weeks    Status Not Met      PT LONG TERM GOAL #3   Title Patient will be able to ambulate at least 325 feet in 2MWT in order to demonstrate improved gait speed for community ambulation.    Time 6    Period Weeks    Status Achieved      PT LONG TERM GOAL #4   Status --                   Plan - 05/01/21 0743     Clinical Impression Statement Patient has met 2/2 short term goals and 1/3 long term goals with ability to complete HEP and improvement in symptoms, gait, strength, and functional mobility. Patient remains limited by symptoms, continued glute weakness, and difficulty with ADL due to pain leading to remaining goals not met. Patient demonstrating improving ambulation ability today and patient is shown and performs ambulation with SPC to reduce strain on L glute with ambulation.  Reviewed HEP with patient and educated on returning to PT if needed. Patient discharged from physical therapy at this time.    Personal Factors and Comorbidities Age;Comorbidity 3+    Comorbidities Back pain/surgeries, BMI over 30, hx Cancer, Diabetes, High Blood  Pressure,    Examination-Activity Limitations Locomotion Level;Transfers;Stand;Stairs;Squat;Lift;Caring for Others;Carry    Examination-Participation Restrictions Cleaning;Community Activity;Interpersonal Relationship;Laundry;Shop;Volunteer;Yard Work;Meal Prep    Stability/Clinical Decision Making Stable/Uncomplicated    Rehab Potential Good    PT Frequency --    PT Duration --    PT Treatment/Interventions ADLs/Self Care Home Management;Aquatic Therapy;Cryotherapy;Electrical Stimulation;Iontophoresis 51m/ml Dexamethasone;Moist  Heat;Traction;Ultrasound;Parrafin;DME Instruction;Gait training;Stair training;Functional mobility training;Therapeutic activities;Therapeutic exercise;Balance training;Neuromuscular re-education;Cognitive remediation;Patient/family education;Orthotic Fit/Training;Manual techniques;Manual lymph drainage;Compression bandaging;Dry needling;Energy conservation;Splinting;Taping    PT Next Visit Plan n/a    PT Home Exercise Plan bridge, clam 7/19 STS, hip extension 7/21 standing hip extension, 7/24 Added hip excursion, piriformis stretch and good sitting posture with scapular retraction 8/15 STS    Consulted and Agree with Plan of Care Patient             Patient will benefit from skilled therapeutic intervention in order to improve the following deficits and impairments:  Abnormal gait, Difficulty walking, Decreased endurance, Increased muscle spasms, Decreased activity tolerance, Pain, Decreased balance, Improper body mechanics, Decreased mobility, Decreased strength  Visit Diagnosis: Muscle weakness (generalized)  Pain in left hip  Other abnormalities of gait and mobility  Other symptoms and signs involving the musculoskeletal system     Problem List Patient Active Problem List   Diagnosis Date Noted   Obese 02/28/2018   S/P left TKA 02/25/2018   S/P total knee replacement 02/25/2018   Fever 10/09/2016   Community acquired pneumonia of left lower lobe of lung 10/09/2016   Gram negative sepsis ([place] 02/05/2014   UTI (urinary tract infection) 02/04/2014   Sepsis ([place] 02/04/2014   DM type 2 (diabetes mellitus, type 2) ([place] 02/04/2014   Tobacco abuse 02/04/2014   Preop cardiovascular exam 08/05/2013   CAD (coronary artery disease) 08/05/2012   Degenerative lumbar spinal stenosis 04/03/2012   Chest pain 04/03/2012   Anemia 04/03/2012   Smoking 06/13/2011   JOINT EFFUSION, KNEE 01/11/2010   ARTHRITIS, RIGHT KNEE 12/12/2009   PAIN IN JOINT, LOWER LEG 12/12/2009   PROSTATE  CANCER 11/08/2009   GOUTY ARTHRITIS, CHRONIC 11/08/2009   Overweight 11/08/2009   HYPERTENSION, BENIGN 11/08/2009   Carotid artery disease without cerebral infarction ([place] 11/08/2009   Malignant neoplasm of bladder ([place] 03/22/2008   Hyperlipidemia 03/22/2008   PAD (peripheral artery disease) ([place] 03/22/2008   CONSTIPATION, CHRONIC 03/22/2008   DEGENERATIVE JOINT DISEASE 03/22/2008   OSA (obstructive sleep apnea) 03/22/2008   PROSTATE CANCER, HX OF 03/22/2008   DIVERTICULITIS, HX OF 03/22/2008   8:20 AM, 05/01/21 AMearl LatinPT, DPT Physical Therapist [place] [address]Phone: [phone]  Fax:  [phone] Name: ADOLPH TAVANOMRN: [id number] of Birth: 417-Feb-1947

## 2021-05-03 ENCOUNTER — Encounter (HOSPITAL_COMMUNITY): Payer: Medicare HMO | Admitting: Physical Therapy

## 2021-05-09 ENCOUNTER — Encounter (HOSPITAL_COMMUNITY): Payer: Medicare HMO | Admitting: Physical Therapy

## 2021-05-11 ENCOUNTER — Encounter (HOSPITAL_COMMUNITY): Payer: Medicare HMO

## 2021-06-16 ENCOUNTER — Ambulatory Visit: Payer: Medicare HMO | Admitting: Cardiology

## 2021-06-16 ENCOUNTER — Encounter: Payer: Self-pay | Admitting: Cardiology

## 2021-06-16 ENCOUNTER — Encounter: Payer: Self-pay | Admitting: *Deleted

## 2021-06-16 ENCOUNTER — Other Ambulatory Visit: Payer: Self-pay

## 2021-06-16 VITALS — BP 132/64 | HR 69 | Ht 65.0 in | Wt 194.6 lb

## 2021-06-16 DIAGNOSIS — I779 Disorder of arteries and arterioles, unspecified: Secondary | ICD-10-CM | POA: Diagnosis not present

## 2021-06-16 DIAGNOSIS — I10 Essential (primary) hypertension: Secondary | ICD-10-CM | POA: Diagnosis not present

## 2021-06-16 DIAGNOSIS — E782 Mixed hyperlipidemia: Secondary | ICD-10-CM | POA: Diagnosis not present

## 2021-06-16 DIAGNOSIS — R011 Cardiac murmur, unspecified: Secondary | ICD-10-CM

## 2021-06-16 DIAGNOSIS — I2510 Atherosclerotic heart disease of native coronary artery without angina pectoris: Secondary | ICD-10-CM | POA: Diagnosis not present

## 2021-06-16 NOTE — Progress Notes (Signed)
Clinical Summary [name] is a 75 y.o.maleseen today for follow up of the following medical problems.    1. HTN     - cpmpliant with meds - home bp's 130s/70s - dizzy spells have resolved with spreading out bp meds throughout the day    2. Carotid stenosis - RICA >52%, LICA <77%.  - has been on ASA and plavix, presume due to his history cerebrovascular disease  - followed by vascular. He wants to follow here for serial imaging.      Jan 2022 carotid US RICA 82-42%, LICA 35-36% - no recent symptoms     3. Hyperlipidemia - compliant with statin - labs followe dby pcp   4. OSA - compliant with CPAP   5. Prostate CA - history of prior seed implant, reports cancer free   6. CAD - incidental findings form prior chest CT in 2013. The patient has not had symptoms of angina - 05/2017 nuclear stress: possible mild inferoseptal ischemia vs artifact, low risk   - no recent chest pains   7. AAA screen - male over 53 former smoker - 02/2014 CT showed no aneurysm   Past Medical History:  Diagnosis Date   Bilateral renal cysts    Bladder cancer ([place]) UROLOGIST-  DR [facility]   RECURRENT 11/ 2017 --  hx turbt in 2004 and 08-19-2013   BPH without obstruction/lower urinary tract symptoms    Cervical fusion syndrome    LEFT ARM NUMBNESS--  CONTROLLED W/ GABAPENTIN   Coronary artery disease    CARDIOLOGIST-- DR Harl Bowie [name])   DDD (degenerative disc disease), cervical    DDD (degenerative disc disease), lumbar    Diverticulosis of sigmoid colon    History of acute gouty arthritis    History of adenomatous polyp of colon    2001;  2012- non-malignant leiomyoma;  04-07-2014  tubualr adenoma and hyperplastic polyp's   History of prostate cancer UROLOGIST-  DR [facility]   S/P  RADIOACTIVE SEED IMPLANTS 2004   HOH (hard of hearing)    no hearing aids   Hyperlipidemia    Hypertension    OA (osteoarthritis)    Occlusion and stenosis of carotid artery without mention  of cerebral infarction CARDIOLOGIST  -- DR  Roderic Palau Lopez Dentinger   LAST DUPLEX  11-23-2015  RICA  (DUPLEX DOPPLER 01-20-2016 RICA 14-43% PROXIMAL) /   LICA <15% (HAD CONSULT W/ [name] , VASCULAR SURGEON 01-20-2016)   OSA on CPAP    BiPAP   Spondylosis, cervical    Type 2 diabetes mellitus ([facility])    Wears glasses      Allergies  Allergen Reactions   Dilaudid [Hydromorphone Hcl] Other (See Comments)    "acts a little off"   Toradol [Ketorolac Tromethamine] Other (See Comments)    CauseD  hallucination ?-states not sure/ avoids per his MD   Lyrica [Pregabalin] Nausea Only and Other (See Comments)    Make me feel "bad"     Current Outpatient Medications  Medication Sig Dispense Refill   allopurinol (ZYLOPRIM) 300 MG tablet Take 300 mg by mouth at bedtime.     amLODipine (NORVASC) 10 MG tablet TAKE 1 TABLET BY MOUTH EVERY DAY 90 tablet 3   aspirin 81 MG EC tablet Take 81 mg by mouth at bedtime.     celecoxib (CELEBREX) 200 MG capsule Take 200 mg by mouth 2 (two) times daily.      clopidogrel (PLAVIX) 75 MG tablet Take 75 mg by  mouth daily.   6   colchicine 0.6 MG tablet Take 0.6 mg by mouth 2 (two) times daily as needed (gout).      cyclobenzaprine (FLEXERIL) 10 MG tablet Take 10 mg by mouth 3 (three) times daily as needed.     docusate sodium (COLACE) 100 MG capsule Take 200 mg by mouth at bedtime.      ferrous sulfate (FERROUSUL) 325 (65 FE) MG tablet Take 1 tablet (325 mg total) by mouth 3 (three) times daily with meals.  3   finasteride (PROSCAR) 5 MG tablet Take 5 mg by mouth at bedtime.     furosemide (LASIX) 40 MG tablet Take 40 mg by mouth every morning.     gabapentin (NEURONTIN) 300 MG capsule Take 300 mg by mouth 3 (three) times daily.     HYDROcodone-acetaminophen (NORCO) 7.5-325 MG tablet Take 1-2 tablets by mouth every 4 (four) hours as needed for moderate pain. 60 tablet 0   labetalol (NORMODYNE) 300 MG tablet Take 300 mg by mouth 2 (two) times daily.     losartan (COZAAR)  100 MG tablet Take 1 tablet (100 mg total) by mouth every evening. 30 tablet 6   metFORMIN (GLUCOPHAGE-XR) 500 MG 24 hr tablet Take 2 tablets by mouth in the morning and at bedtime.     methocarbamol (ROBAXIN) 500 MG tablet Take 1 tablet (500 mg total) by mouth every 6 (six) hours as needed for muscle spasms. 40 tablet 0   Omega-3 Fatty Acids (FISH OIL) 1200 MG CAPS Take 1,200 mg by mouth 2 (two) times daily.     polyethylene glycol powder (GLYCOLAX/MIRALAX) powder TAKE 17MG (1CAPFUL) BY MOUTH(MIXED WITH WATER/JUICE) DAILY 527 g 0   rosuvastatin (CRESTOR) 10 MG tablet Take 10 mg by mouth every morning.      tamsulosin (FLOMAX) 0.4 MG CAPS capsule Take 0.4 mg by mouth daily.     traMADol (ULTRAM) 50 MG tablet Take 50 mg by mouth every 6 (six) hours as needed for pain.     No current facility-administered medications for this visit.     Past Surgical History:  Procedure Laterality Date   ANTERIOR REMOVAL CAGE AND PLATE C3-C7/ CORPECTOMY C7 (FX)/  ALLOGRAFT AND FUSION C3 -- T1/  POSTERIOR DECOMPRESSION BILATERAL LAMINECTOMY C4 -- 6 & PARTIAL C3/  POSTEROLATERAL ARTHRODESIS C3 - T1  06/05/2005   APPENDECTOMY  1990'S   CATARACT EXTRACTION W/ INTRAOCULAR LENS  IMPLANT, BILATERAL  2013   CERVICAL FUSION  05/11/2005   C3  --  C7/  due to post op quadriparesis same day s/p  anterior C 4,5,6, colpectomy, decompression, removal epidural hematoma, foraminnotomy, cage and plate   COLONOSCOPY  last one 04-07-2014   CYSTOSCOPY W/ RETROGRADES Bilateral 08/19/2013   Procedure: CYSTOSCOPY WITH RETROGRADE PYELOGRAM;  Surgeon: Molli Hazard, MD;  [facility];  Service: Urology;  Laterality: Bilateral;   CYSTOSCOPY W/ RETROGRADES Bilateral 07/18/2016   Procedure: CYSTOSCOPY WITH RETROGRADE PYELOGRAM;  Surgeon: Alexis Frock, MD;  Location: [facility];  Service: Urology;  Laterality: Bilateral;   ENDOSCOPIC REPAIR CSF LEAK VIA NASAL PASSAGE  2011   INTRAOPERATIVE  ARTERIOGRAM  CATH LAB  01-05-2009  DR Einar Gip   RICA   ACUTE ANGLE 80-85% STENOSIS   KNEE ARTHROSCOPY Left 07/15/2017   Procedure: ARTHROSCOPY LEFT  KNEE AND DEBRIDEMENT, medial meniscal tear and chondromalaita;  Surgeon: Paralee Cancel, MD;  Location[facility];  Service: Orthopedics;  Laterality: Left;  [place]  03-26-2012   L3 --  L5   RADIOACTIVE PROSTATE SEED IMPLANTS  08-19-2003   SEPTOPLASTY  1998   TOTAL KNEE ARTHROPLASTY Left 02/25/2018   Procedure: LEFT TOTAL KNEE ARTHROPLASTY;  Surgeon: Paralee Cancel, MD;  [facility];  Service: Orthopedics;  Laterality: Left;  70 mins   TRANSURETHRAL RESECTION OF BLADDER TUMOR N/A 08/19/2013   Procedure: TRANSURETHRAL RESECTION OF BLADDER TUMOR (TURBT);  Surgeon: Molli Hazard, MD;  Location: [facility];  Service: Urology;  Laterality: N/A;   TRANSURETHRAL RESECTION OF BLADDER TUMOR N/A 07/18/2016   Procedure: TRANSURETHRAL RESECTION OF BLADDER TUMOR (TURBT);  Surgeon: Alexis Frock, MD;  Location[facility];  Service: Urology;  Laterality: N/A;   YAG LASER APPLICATION  66/29/4765   Procedure: YAG LASER APPLICATION;  Surgeon: Elta Guadeloupe T. Gershon Crane, MD;  Location[facility];  Service: Ophthalmology;  Laterality: Right;     Allergies  Allergen Reactions   Dilaudid [Hydromorphone Hcl] Other (See Comments)    "acts a little off"   Toradol [Ketorolac Tromethamine] Other (See Comments)    CauseD  hallucination ?-states not sure/ avoids per his MD   Lyrica [Pregabalin] Nausea Only and Other (See Comments)    Make me feel "bad"      Family History  Problem Relation Age of Onset   Breast cancer Mother        mets   Diabetes Mother    Heart disease Father        MI   Colon cancer Brother    Cancer Sister        male organs   Diabetes Sister        family hx   Arthritis Other        entire family   Diabetes Brother        x 2     Social History Mr. Witucki reports that he has been  smoking cigarettes. He has a 47.00 pack-year smoking history. He has never used smokeless tobacco. Mr. Prisk reports no history of alcohol use.   Review of Systems CONSTITUTIONAL: No weight loss, fever, chills, weakness or fatigue.  HEENT: Eyes: No visual loss, blurred vision, double vision or yellow sclerae.No hearing loss, sneezing, congestion, runny nose or sore throat.  SKIN: No rash or itching.  CARDIOVASCULAR: per hpi RESPIRATORY: No shortness of breath, cough or sputum.  GASTROINTESTINAL: No anorexia, nausea, vomiting or diarrhea. No abdominal pain or blood.  GENITOURINARY: No burning on urination, no polyuria NEUROLOGICAL: No headache, dizziness, syncope, paralysis, ataxia, numbness or tingling in the extremities. No change in bowel or bladder control.  MUSCULOSKELETAL: No muscle, back pain, joint pain or stiffness.  LYMPHATICS: No enlarged nodes. No history of splenectomy.  PSYCHIATRIC: No history of depression or anxiety.  ENDOCRINOLOGIC: No reports of sweating, cold or heat intolerance. No polyuria or polydipsia.  Marland Kitchen   Physical Examination Today's Vitals   06/16/21 0815  BP: 132/64  Pulse: 69  SpO2: 96%  Weight: 194 lb 9.6 oz (88.3 kg)  Height: 5\' 5"  (1.651 m)   Body mass index is 32.38 kg/m.  Gen: resting comfortably, no acute distress HEENT: no scleral icterus, pupils equal round and reactive, no palptable cervical adenopathy,  CV: RRR, 2/6 systolic murmur apex, no jvd Resp: Clear to auscultation bilaterally GI: abdomen is soft, non-tender, non-distended, normal bowel sounds, no hepatosplenomegaly MSK: extremities are warm, no edema.  Skin: warm, no rash Neuro:  no focal deficits Psych: appropriate affect   Diagnostic Studies 05/2017  nuclear stress No diagnostic ST segment changes indicating ischemia. No arrhythmias. Moderate-sized, mild intensity, inferior and inferoseptal defects that are largely fixed with a partial region of reversibility in the mid  inferoseptal zone. Normal wall motion in this area argues against scar. Although diaphragmatic attenuation is likely playing a role, consider a small region of inferoseptal ischemia. This is a low risk study. Nuclear stress EF: 64%.      Assessment and Plan   1 . HTN - at goal, continue current meds  2. Carotid stenosis - stable modrate disease, continue to monitor   3. Hyperlipidemia - continue rosuvastatin, request pcp labs   4. CAD - incidental finding on CT scan, he has not had any cardiac symptoms - prior low risk stress test - EKG lateral precordial ST/T changes. In absnece of symptoms would not plan ischemic testing at this time. Prior stress test was low risk.     Arnoldo Lenis, M.D.

## 2021-06-16 NOTE — Patient Instructions (Addendum)

## 2021-08-24 ENCOUNTER — Other Ambulatory Visit: Payer: Self-pay

## 2021-08-24 ENCOUNTER — Ambulatory Visit (INDEPENDENT_AMBULATORY_CARE_PROVIDER_SITE_OTHER): Payer: Medicare HMO

## 2021-08-24 DIAGNOSIS — R011 Cardiac murmur, unspecified: Secondary | ICD-10-CM | POA: Diagnosis not present

## 2021-08-24 LAB — ECHOCARDIOGRAM COMPLETE
AR max vel: 1.11 cm2
AV Area VTI: 1.05 cm2
AV Area mean vel: 1.13 cm2
AV Mean grad: 27 mmHg
AV Peak grad: 42.3 mmHg
Ao pk vel: 3.25 m/s
Area-P 1/2: 2.28 cm2
Calc EF: 76.6 %
P 1/2 time: 1334 ms
S' Lateral: 3.51 cm
Single Plane A2C EF: 80.1 %
Single Plane A4C EF: 73 %

## 2021-09-05 ENCOUNTER — Encounter: Payer: Self-pay | Admitting: *Deleted

## 2022-01-11 ENCOUNTER — Ambulatory Visit (INDEPENDENT_AMBULATORY_CARE_PROVIDER_SITE_OTHER): Payer: Medicare HMO

## 2022-01-11 DIAGNOSIS — I6523 Occlusion and stenosis of bilateral carotid arteries: Secondary | ICD-10-CM | POA: Diagnosis not present

## 2022-01-23 ENCOUNTER — Encounter: Payer: Self-pay | Admitting: Cardiology

## 2022-01-23 ENCOUNTER — Ambulatory Visit: Payer: Medicare HMO | Admitting: Cardiology

## 2022-01-23 VITALS — BP 134/68 | HR 70 | Ht 67.0 in | Wt 187.0 lb

## 2022-01-23 DIAGNOSIS — I350 Nonrheumatic aortic (valve) stenosis: Secondary | ICD-10-CM | POA: Diagnosis not present

## 2022-01-23 DIAGNOSIS — I10 Essential (primary) hypertension: Secondary | ICD-10-CM

## 2022-01-23 DIAGNOSIS — E782 Mixed hyperlipidemia: Secondary | ICD-10-CM | POA: Diagnosis not present

## 2022-01-23 DIAGNOSIS — I2510 Atherosclerotic heart disease of native coronary artery without angina pectoris: Secondary | ICD-10-CM

## 2022-01-23 DIAGNOSIS — I6523 Occlusion and stenosis of bilateral carotid arteries: Secondary | ICD-10-CM | POA: Diagnosis not present

## 2022-01-23 NOTE — Patient Instructions (Addendum)
Medication Instructions:  ?Your physician recommends that you continue on your current medications as directed. Please refer to the Current Medication list given to you today. ? ?Labwork: ?none ? ?Testing/Procedures: ?none ? ?Follow-Up: ?Your physician recommends that you schedule a follow-up appointment in: 6 months ? ?Any Other Special Instructions Will Be Listed Below (If Applicable). ?You have been referred to Vein & Vascular  ? ?If you need a refill on your cardiac medications before your next appointment, please call your pharmacy. ?

## 2022-01-23 NOTE — Progress Notes (Signed)
? ? ? ?Clinical Summary ?[name] is a 76 y.o.male seen today for follow up of the following medical problems.  ?  ?1. HTN ?-compliant with meds ?- dizzy spells have resolved with spreading out bp meds throughout the day ?  ?  ?2. Carotid stenosis ?- RICA >87%, LICA <68%.  ?- has been on ASA and plavix, presume due to his history cerebrovascular disease ? - followed by vascular. He wants to follow here for serial imaging.  ?  ?  ?Jan 2022 carotid US ?RICA 11-57%, LICA 26-20% ?- no recent symptoms ? ? ?11/5595 RICA 41-63%, LICA 84-53% ?  ? ? ?3. Hyperlipidemia ?- compliant with statin ?- labs followe dby pcp ?  ?4. OSA ?- compliant with CPAP ?  ?5. Prostate CA ?- history of prior seed implant, reports cancer free ?  ?6. CAD ?- incidental findings form prior chest CT in 2013. The patient has not had symptoms of angina ?- 05/2017 nuclear stress: possible mild inferoseptal ischemia vs artifact, low risk ?  ?- no recent symptoms.  ? ?7. Moderate aortic aortic stenosis ?- 04/2021 echo moderate AS mean 27 mmHg, AVA VTI 1.05 ?- no symptoms ? ? ?8. AAA screen ?- male over 38 former smoker ?- 02/2014 CT showed no aneurysm ?  ?Past Medical History:  ?Diagnosis Date  ? Bilateral renal cysts   ? Bladder cancer ([place]) [place]  ? RECURRENT 11/ 2017 --  hx turbt in 2004 and 08-19-2013  ? BPH without obstruction/lower urinary tract symptoms   ? Cervical fusion syndrome   ? LEFT ARM NUMBNESS--  CONTROLLED W/ GABAPENTIN  ? Coronary artery disease   ? CARDIOLOGIST-- DR Harl Bowie [name])  ? DDD (degenerative disc disease), cervical   ? DDD (degenerative disc disease), lumbar   ? Diverticulosis of sigmoid colon   ? History of acute gouty arthritis   ? History of adenomatous polyp of colon   ? 2001;  2012- non-malignant leiomyoma;  04-07-2014  tubualr adenoma and hyperplastic polyp's  ? History of prostate cancer UROLOGIST-  DR [facility]  ? S/P  RADIOACTIVE SEED IMPLANTS 2004  ? HOH (hard of hearing)   ? no hearing aids  ?  Hyperlipidemia   ? Hypertension   ? OA (osteoarthritis)   ? Occlusion and stenosis of carotid artery without mention of cerebral infarction CARDIOLOGIST  -- DR  Roderic Palau Latarsha Zani  ? LAST DUPLEX  11-23-2015  RICA  (DUPLEX DOPPLER 01-20-2016 RICA 64-68% PROXIMAL) /   LICA <03% (HAD CONSULT W/ [name] , VASCULAR SURGEON 01-20-2016)  ? OSA on CPAP   ? BiPAP  ? Spondylosis, cervical   ? Type 2 diabetes mellitus ([place])   ? Wears glasses   ? ? ? ?Allergies  ?Allergen Reactions  ? Dilaudid [Hydromorphone Hcl] Other (See Comments)  ?  "acts a little off"  ? Toradol [Ketorolac Tromethamine] Other (See Comments)  ?  CauseD  hallucination ?-states not sure/ avoids per his MD  ? Lyrica [Pregabalin] Nausea Only and Other (See Comments)  ?  Make me feel "bad"  ? ? ? ?Current Outpatient Medications  ?Medication Sig Dispense Refill  ? allopurinol (ZYLOPRIM) 300 MG tablet Take 300 mg by mouth at bedtime.    ? amLODipine (NORVASC) 10 MG tablet TAKE 1 TABLET BY MOUTH EVERY DAY 90 tablet 3  ? aspirin 81 MG EC tablet Take 81 mg by mouth at bedtime.    ? celecoxib (CELEBREX) 200 MG capsule Take 200 mg  by mouth 2 (two) times daily.     ? clopidogrel (PLAVIX) 75 MG tablet Take 75 mg by mouth daily.   6  ? colchicine 0.6 MG tablet Take 0.6 mg by mouth 2 (two) times daily as needed (gout).     ? cyclobenzaprine (FLEXERIL) 10 MG tablet Take 10 mg by mouth 3 (three) times daily as needed.    ? docusate sodium (COLACE) 100 MG capsule Take 200 mg by mouth at bedtime.     ? ferrous sulfate (FERROUSUL) 325 (65 FE) MG tablet Take 1 tablet (325 mg total) by mouth 3 (three) times daily with meals.  3  ? finasteride (PROSCAR) 5 MG tablet Take 5 mg by mouth at bedtime.    ? furosemide (LASIX) 40 MG tablet Take 40 mg by mouth every morning.    ? gabapentin (NEURONTIN) 300 MG capsule Take 300 mg by mouth 3 (three) times daily.    ? HYDROcodone-acetaminophen (NORCO) 7.5-325 MG tablet Take 1-2 tablets by mouth every 4 (four) hours as needed for moderate pain.  60 tablet 0  ? labetalol (NORMODYNE) 300 MG tablet Take 300 mg by mouth 2 (two) times daily.    ? losartan (COZAAR) 100 MG tablet Take 1 tablet (100 mg total) by mouth every evening. 30 tablet 6  ? metFORMIN (GLUCOPHAGE-XR) 500 MG 24 hr tablet Take 2 tablets by mouth in the morning and at bedtime.    ? methocarbamol (ROBAXIN) 500 MG tablet Take 1 tablet (500 mg total) by mouth every 6 (six) hours as needed for muscle spasms. 40 tablet 0  ? Omega-3 Fatty Acids (FISH OIL) 1200 MG CAPS Take 1,200 mg by mouth 2 (two) times daily.    ? polyethylene glycol powder (GLYCOLAX/MIRALAX) powder TAKE '17MG'$ (1CAPFUL) BY MOUTH(MIXED WITH WATER/JUICE) DAILY 527 g 0  ? rosuvastatin (CRESTOR) 10 MG tablet Take 10 mg by mouth every morning.     ? tamsulosin (FLOMAX) 0.4 MG CAPS capsule Take 0.4 mg by mouth daily.    ? traMADol (ULTRAM) 50 MG tablet Take 50 mg by mouth every 6 (six) hours as needed for pain.    ? ?No current facility-administered medications for this visit.  ? ? ? ?Past Surgical History:  ?Procedure Laterality Date  ? ANTERIOR REMOVAL CAGE AND PLATE C3-C7/ CORPECTOMY C7 (FX)/  ALLOGRAFT AND FUSION C3 -- T1/  POSTERIOR DECOMPRESSION BILATERAL LAMINECTOMY C4 -- 6 & PARTIAL C3/  POSTEROLATERAL ARTHRODESIS C3 - T1  06/05/2005  ? APPENDECTOMY  1990'S  ? CATARACT EXTRACTION W/ INTRAOCULAR LENS  IMPLANT, BILATERAL  2013  ? CERVICAL FUSION  05/11/2005  ? C3  --  C7/  due to post op quadriparesis same day s/p  anterior C 4,5,6, colpectomy, decompression, removal epidural hematoma, foraminnotomy, cage and plate  ? COLONOSCOPY  last one 04-07-2014  ? CYSTOSCOPY W/ RETROGRADES Bilateral 08/19/2013  ? Procedure: CYSTOSCOPY WITH RETROGRADE PYELOGRAM;  Surgeon: Molli Hazard, MD;  Location: [facility];  Service: Urology;  Laterality: Bilateral;  ? CYSTOSCOPY W/ RETROGRADES Bilateral 07/18/2016  ? Procedure: CYSTOSCOPY WITH RETROGRADE PYELOGRAM;  Surgeon: Alexis Frock, MD;  Location: [facility];   Service: Urology;  Laterality: Bilateral;  ? ENDOSCOPIC REPAIR CSF LEAK VIA NASAL PASSAGE  2011  ? INTRAOPERATIVE ARTERIOGRAM  CATH LAB  01-05-2009  DR Einar Gip  ? RICA   ACUTE ANGLE 80-85% STENOSIS  ? KNEE ARTHROSCOPY Left 07/15/2017  ? Procedure: ARTHROSCOPY LEFT  KNEE AND DEBRIDEMENT, medial meniscal tear and chondromalaita;  Surgeon: Alvan Dame,  Rodman Key, MD;  Location: [facility];  Service: Orthopedics;  Laterality: Left;  60 MINS  ? LUMBAR FUSION  03-26-2012  ? L3 --  L5  ? RADIOACTIVE PROSTATE SEED IMPLANTS  08-19-2003  ? SEPTOPLASTY  1998  ? TOTAL KNEE ARTHROPLASTY Left 02/25/2018  ? Procedure: LEFT TOTAL KNEE ARTHROPLASTY;  Surgeon: Paralee Cancel, MD;  Location: [facility];  Service: Orthopedics;  Laterality: Left;  70 mins  ? TRANSURETHRAL RESECTION OF BLADDER TUMOR N/A 08/19/2013  ? Procedure: TRANSURETHRAL RESECTION OF BLADDER TUMOR (TURBT);  Surgeon: Molli Hazard, MD;  Location: [facility];  Service: Urology;  Laterality: N/A;  ? TRANSURETHRAL RESECTION OF BLADDER TUMOR N/A 07/18/2016  ? Procedure: TRANSURETHRAL RESECTION OF BLADDER TUMOR (TURBT);  Surgeon: Alexis Frock, MD;  Location: [facility];  Service: Urology;  Laterality: N/A;  ? YAG LASER APPLICATION  94/85/4627  ? Procedure: YAG LASER APPLICATION;  Surgeon: Elta Guadeloupe T. Gershon Crane, MD;  Location: [facility];  Service: Ophthalmology;  Laterality: Right;  ? ? ? ?Allergies  ?Allergen Reactions  ? Dilaudid [Hydromorphone Hcl] Other (See Comments)  ?  "acts a little off"  ? Toradol [Ketorolac Tromethamine] Other (See Comments)  ?  CauseD  hallucination ?-states not sure/ avoids per his MD  ? Lyrica [Pregabalin] Nausea Only and Other (See Comments)  ?  Make me feel "bad"  ? ? ? ? ?Family History  ?Problem Relation Age of Onset  ? Breast cancer Mother   ?     mets  ? Diabetes Mother   ? Heart disease Father   ?     MI  ? Colon cancer Brother   ? Cancer Sister   ?     male organs  ? Diabetes Sister   ?     family hx  ? Arthritis Other   ?      entire family  ? Diabetes Brother   ?     x 2  ? ? ? ?Social History ?Mr. Emberton reports that he has been smoking cigarettes. He has a 47.00 pack-year smoking history. He has never used smokeless t

## 2022-01-31 ENCOUNTER — Other Ambulatory Visit (HOSPITAL_COMMUNITY): Payer: Self-pay | Admitting: Cardiology

## 2022-01-31 DIAGNOSIS — I6523 Occlusion and stenosis of bilateral carotid arteries: Secondary | ICD-10-CM

## 2022-03-07 ENCOUNTER — Encounter: Payer: Self-pay | Admitting: Vascular Surgery

## 2022-03-07 ENCOUNTER — Ambulatory Visit: Payer: Medicare HMO | Admitting: Vascular Surgery

## 2022-03-07 VITALS — BP 146/75 | HR 70 | Temp 98.4°F | Resp 16 | Ht 67.0 in | Wt 189.0 lb

## 2022-03-07 DIAGNOSIS — I6523 Occlusion and stenosis of bilateral carotid arteries: Secondary | ICD-10-CM

## 2022-03-07 NOTE — Progress Notes (Signed)
[facility] [place]  Patient name: [name] MRN: [id number] DOB: 05-11-1946 Sex: male  REASON FOR CONSULT: Evaluation critical carotid disease  HPI: [name] is a 76 y.o. male, who is here for discussion of carotid occlusive disease.  He has a very complicated past history.  He apparently was seen by Dr. Amedeo Plenty in our office approximately 15 years ago with moderate to severe carotid disease.  I do not have notes for this evaluation but apparently was felt to be prohibitive risk due to prior cervical fusion.  He had had additional duplex and saw Dr. [name] in our office in 2017.  Again he was found to have severe stenosis in his right internal carotid artery but again was felt to be prohibitive risk for surgery due to anatomic considerations for his prior cervical fusion.  Fortunately he remains completely asymptomatic from standpoint of his carotid disease.  He denies any episodes of aphasia, amaurosis fugax, TIA or stroke.  He underwent repeat duplex in January 11, 2022 and this shows again greater than 80% stenosis on the right with velocity criteria suggesting some progression of disease.  On his left carotid he has 60 to 79% stenosis.  Past Medical History:  Diagnosis Date   Bilateral renal cysts    Bladder cancer ([place]) UROLOGIST-  DR [facility]   RECURRENT 11/ 2017 --  hx turbt in 2004 and 08-19-2013   BPH without obstruction/lower urinary tract symptoms    Cervical fusion syndrome    LEFT ARM NUMBNESS--  CONTROLLED W/ GABAPENTIN   Coronary artery disease    CARDIOLOGIST-- DR Harl Bowie [name])   DDD (degenerative disc disease), cervical    DDD (degenerative disc disease), lumbar    Diverticulosis of sigmoid colon    History of acute gouty arthritis    History of adenomatous polyp of colon    2001;  2012- non-malignant leiomyoma;  04-07-2014  tubualr adenoma and hyperplastic polyp's   History of prostate cancer  UROLOGIST-  DR [facility]   S/P  RADIOACTIVE SEED IMPLANTS 2004   HOH (hard of hearing)    no hearing aids   Hyperlipidemia    Hypertension    OA (osteoarthritis)    Occlusion and stenosis of carotid artery without mention of cerebral infarction CARDIOLOGIST  -- DR  [name]   LAST DUPLEX  11-23-2015  RICA  (DUPLEX DOPPLER 01-20-2016 RICA 74-94% PROXIMAL) /   LICA <49% (HAD CONSULT W/ [name] , VASCULAR SURGEON 01-20-2016)   OSA on CPAP    BiPAP   Spondylosis, cervical    Type 2 diabetes mellitus ([place])    Wears glasses     Family History  Problem Relation Age of Onset   Breast cancer Mother        mets   Diabetes Mother    Heart disease Father        MI   Colon cancer Brother    Cancer Sister        male organs   Diabetes Sister        family hx   Arthritis Other        entire family   Diabetes Brother        x 2    SOCIAL HISTORY: Social History   Socioeconomic History   Marital status: Married    Spouse name: Not on file   Number of children: 2   Years of education: Not on file   Highest education level:  Not on file  Occupational History   Occupation: retired//disabled  Tobacco Use   Smoking status: Every Day    Packs/day: 1.00    Years: 47.00    Total pack years: 47.00    Types: Cigarettes   Smokeless tobacco: Never   Tobacco comments:    pt quits smoking periodically :: started smoking again Jan 2018 1ppd  Vaping Use   Vaping Use: Never used  Substance and Sexual Activity   Alcohol use: No    Alcohol/week: 0.0 standard drinks of alcohol   Drug use: No   Sexual activity: Not on file  Other Topics Concern   Not on file  Social History Narrative   Not on file   Social Determinants of Health   Financial Resource Strain: Not on file  Food Insecurity: Not on file  Transportation Needs: Not on file  Physical Activity: Not on file  Stress: Not on file  Social Connections: Not on file  Intimate Partner Violence: Not on file    Allergies   Allergen Reactions   Dilaudid [Hydromorphone Hcl] Other (See Comments)    "acts a little off"   Toradol [Ketorolac Tromethamine] Other (See Comments)    CauseD  hallucination ?-states not sure/ avoids per his MD   Lyrica [Pregabalin] Nausea Only and Other (See Comments)    Make me feel "bad"    Current Outpatient Medications  Medication Sig Dispense Refill   allopurinol (ZYLOPRIM) 300 MG tablet Take 300 mg by mouth at bedtime.     amLODipine (NORVASC) 10 MG tablet TAKE 1 TABLET BY MOUTH EVERY DAY 90 tablet 3   aspirin 81 MG EC tablet Take 81 mg by mouth at bedtime.     celecoxib (CELEBREX) 200 MG capsule Take 200 mg by mouth 2 (two) times daily.      clopidogrel (PLAVIX) 75 MG tablet Take 75 mg by mouth daily.   6   colchicine 0.6 MG tablet Take 0.6 mg by mouth 2 (two) times daily as needed (gout).      cyclobenzaprine (FLEXERIL) 10 MG tablet Take 10 mg by mouth 3 (three) times daily as needed.     docusate sodium (COLACE) 100 MG capsule Take 200 mg by mouth at bedtime.      ferrous sulfate (FERROUSUL) 325 (65 FE) MG tablet Take 1 tablet (325 mg total) by mouth 3 (three) times daily with meals.  3   finasteride (PROSCAR) 5 MG tablet Take 5 mg by mouth at bedtime.     furosemide (LASIX) 40 MG tablet Take 40 mg by mouth every morning.     gabapentin (NEURONTIN) 300 MG capsule Take 300 mg by mouth 3 (three) times daily.     HYDROcodone-acetaminophen (NORCO) 7.5-325 MG tablet Take 1-2 tablets by mouth every 4 (four) hours as needed for moderate pain. 60 tablet 0   labetalol (NORMODYNE) 300 MG tablet Take 300 mg by mouth 2 (two) times daily.     losartan (COZAAR) 100 MG tablet Take 1 tablet (100 mg total) by mouth every evening. 30 tablet 6   metFORMIN (GLUCOPHAGE-XR) 500 MG 24 hr tablet Take 2 tablets by mouth in the morning and at bedtime.     methocarbamol (ROBAXIN) 500 MG tablet Take 1 tablet (500 mg total) by mouth every 6 (six) hours as needed for muscle spasms. 40 tablet 0   Omega-3  Fatty Acids (FISH OIL) 1200 MG CAPS Take 1,200 mg by mouth 2 (two) times daily.     polyethylene glycol  powder (GLYCOLAX/MIRALAX) powder TAKE '17MG'$ (1CAPFUL) BY MOUTH(MIXED WITH WATER/JUICE) DAILY 527 g 0   rosuvastatin (CRESTOR) 10 MG tablet Take 10 mg by mouth every morning.      tamsulosin (FLOMAX) 0.4 MG CAPS capsule Take 0.4 mg by mouth daily.     traMADol (ULTRAM) 50 MG tablet Take 50 mg by mouth every 6 (six) hours as needed for pain.     No current facility-administered medications for this visit.    REVIEW OF SYSTEMS:  '[X]'$  denotes positive finding, '[ ]'$  denotes negative finding Cardiac  Comments:  Chest pain or chest pressure:    Shortness of breath upon exertion:    Short of breath when lying flat:    Irregular heart rhythm:        Vascular    Pain in calf, thigh, or hip brought on by ambulation: x   Pain in feet at night that wakes you up from your sleep:     Blood clot in your veins:    Leg swelling:         Pulmonary    Oxygen at home:    Productive cough:     Wheezing:         Neurologic    Sudden weakness in arms or legs:     Sudden numbness in arms or legs:     Sudden onset of difficulty speaking or slurred speech:    Temporary loss of vision in one eye:     Problems with dizziness:         Gastrointestinal    Blood in stool:     Vomited blood:         Genitourinary    Burning when urinating:     Blood in urine:        Psychiatric    Major depression:         Hematologic    Bleeding problems:    Problems with blood clotting too easily:        Skin    Rashes or ulcers:        Constitutional    Fever or chills:      PHYSICAL EXAM: Vitals:   03/07/22 0907 03/07/22 0909  BP: (!) 157/71 (!) 146/75  Pulse: 70   Resp: 16   Temp: 98.4 F (36.9 C)   TempSrc: Temporal   SpO2: 98%   Weight: 189 lb (85.7 kg)   Height: '5\' 7"'$  (1.702 m)     GENERAL: The patient is a well-nourished male, in no acute distress. The vital signs are documented  above. CARDIOVASCULAR: I do not appreciate carotid bruits bilaterally.  He has 2+ radial pulses bilaterally PULMONARY: There is good air exchange  MUSCULOSKELETAL: There are no major deformities or cyanosis. NEUROLOGIC: No focal weakness or paresthesias are detected. SKIN: There are no ulcers or rashes noted. PSYCHIATRIC: The patient has a normal affect.  DATA:  Carotid duplex from 01/11/2022 showing greater than 80% right and 60 to 79% left internal carotid artery stenosis.  MEDICAL ISSUES: I had a long discussion with the patient regarding his carotid stenosis and the decisions made to date.  He is understandably frustrated at seeing [place] on referral repeatedly and being told there is nothing that can be done.  I discussed the predicted 5% risk of annual neurologic event related to severe carotid disease.  Fortunately he has had no symptoms related to his severe carotid disease.  I explained that normal stroke risk for surgery and overall risk is  extremely low.  I did explain that his extensive cervical fusion did put him at a higher anatomic risk for elective surgery.  I did discuss TCAR with the patient.  He has not had a CT scan since 2013 and I have recommended CT angiogram of his neck to determine if he is a candidate for TCAR.  I explained that this very well could make him a much more acceptable anatomic risk than standard carotid endarterectomy.  He wishes to proceed with this plan.  We will coordinate outpatient CT [facility] and then office consultation with Dr. Donzetta Matters in [place].  He understands that if surgery is recommended and he agrees it will be done in [place] Crate, MD [facility] Tel [phone] Pager [phone]  Note: Portions of this report may have been transcribed using voice recognition software.  Every effort has been made to ensure accuracy; however, inadvertent computerized  transcription errors may still be present.

## 2022-03-13 ENCOUNTER — Other Ambulatory Visit: Payer: Self-pay

## 2022-03-13 DIAGNOSIS — I6523 Occlusion and stenosis of bilateral carotid arteries: Secondary | ICD-10-CM

## 2022-03-22 ENCOUNTER — Ambulatory Visit (HOSPITAL_COMMUNITY)
Admission: RE | Admit: 2022-03-22 | Discharge: 2022-03-22 | Disposition: A | Discharge status: Home / Self Care | Payer: Medicare HMO | Source: Ambulatory Visit | Attending: Vascular Surgery | Admitting: Vascular Surgery

## 2022-03-22 DIAGNOSIS — I6523 Occlusion and stenosis of bilateral carotid arteries: Secondary | ICD-10-CM | POA: Diagnosis present

## 2022-03-22 MED ORDER — IOHEXOL 350 MG/ML SOLN
75.0000 mL | Freq: Once | INTRAVENOUS | Status: AC | PRN
  Administered 2022-03-22: 75 mL via INTRAVENOUS

## 2022-03-27 LAB — POCT I-STAT CREATININE: Creatinine, Ser: 1.3 mg/dL — ABNORMAL HIGH (ref 0.61–1.24)

## 2022-03-28 ENCOUNTER — Ambulatory Visit: Payer: Medicare HMO | Admitting: Vascular Surgery

## 2022-03-28 ENCOUNTER — Encounter: Payer: Self-pay | Admitting: Vascular Surgery

## 2022-03-28 VITALS — BP 142/73 | HR 66 | Temp 98.3°F | Resp 20 | Ht 67.0 in | Wt 184.0 lb

## 2022-03-28 DIAGNOSIS — I6523 Occlusion and stenosis of bilateral carotid arteries: Secondary | ICD-10-CM

## 2022-03-28 NOTE — Progress Notes (Signed)
Patient ID: [name], male   DOB: Jul 01, 1946, 76 y.o.   MRN: [id number]  Reason for Consult: Follow-up   Referred by Ricky Squibb, MD  Subjective:     HPI:  [name] is a 76 y.o. male with a known history of carotid artery disease initially evaluated by Dr. Amedeo Plenty more recently by Dr. Donnetta Hutching.  He has extensive history of cervical disc fusion and has been considered not a candidate for carotid surgery.  He denies any history of stroke, TIA or amaurosis.  He was recently undergone duplex and subsequent CT scan which demonstrated high-grade stenosis bilaterally in his carotid arteries.  He remains on aspirin, Plavix and a statin.  Past Medical History:  Diagnosis Date   Bilateral renal cysts    Bladder cancer ([place]) UROLOGIST-  DR [name]   RECURRENT 11/ 2017 --  hx turbt in 2004 and 08-19-2013   BPH without obstruction/lower urinary tract symptoms    Cervical fusion syndrome    LEFT ARM NUMBNESS--  CONTROLLED W/ GABAPENTIN   Coronary artery disease    CARDIOLOGIST-- DR Harl Bowie [name])   DDD (degenerative disc disease), cervical    DDD (degenerative disc disease), lumbar    Diverticulosis of sigmoid colon    History of acute gouty arthritis    History of adenomatous polyp of colon    2001;  2012- non-malignant leiomyoma;  04-07-2014  tubualr adenoma and hyperplastic polyp's   History of prostate cancer UROLOGIST-  DR [name]   S/P  RADIOACTIVE SEED IMPLANTS 2004   HOH (hard of hearing)    no hearing aids   Hyperlipidemia    Hypertension    OA (osteoarthritis)    Occlusion and stenosis of carotid artery without mention of cerebral infarction CARDIOLOGIST  -- DR  [name]   LAST DUPLEX  11-23-2015  RICA  (DUPLEX DOPPLER 01-20-2016 RICA 84-69% PROXIMAL) /   LICA <62% (HAD CONSULT W/ [name] , VASCULAR SURGEON 01-20-2016)   OSA on CPAP    BiPAP   Spondylosis, cervical    Type 2 diabetes mellitus ([place])    Wears glasses    Family History  Problem Relation  Age of Onset   Breast cancer Mother        mets   Diabetes Mother    Heart disease Father        MI   Colon cancer Brother    Cancer Sister        male organs   Diabetes Sister        family hx   Arthritis Other        entire family   Diabetes Brother        x 2   Past Surgical History:  Procedure Laterality Date   ANTERIOR REMOVAL CAGE AND PLATE C3-C7/ CORPECTOMY C7 (FX)/  ALLOGRAFT AND FUSION C3 -- T1/  POSTERIOR DECOMPRESSION BILATERAL LAMINECTOMY C4 -- 6 & PARTIAL C3/  POSTEROLATERAL ARTHRODESIS C3 - T1  06/05/2005   APPENDECTOMY  1990'S   CATARACT EXTRACTION W/ INTRAOCULAR LENS  IMPLANT, BILATERAL  2013   CERVICAL FUSION  05/11/2005   C3  --  C7/  due to post op quadriparesis same day s/p  anterior C 4,5,6, colpectomy, decompression, removal epidural hematoma, foraminnotomy, cage and plate   COLONOSCOPY  last one 04-07-2014   CYSTOSCOPY W/ RETROGRADES Bilateral 08/19/2013   Procedure: CYSTOSCOPY WITH RETROGRADE PYELOGRAM;  Surgeon: Molli Hazard, MD;  [facility];  Service:  Urology;  Laterality: Bilateral;   CYSTOSCOPY W/ RETROGRADES Bilateral 07/18/2016   Procedure: CYSTOSCOPY WITH RETROGRADE PYELOGRAM;  Surgeon: Alexis Frock, MD;  Location[facility];  Service: Urology;  Laterality: Bilateral;   ENDOSCOPIC REPAIR CSF LEAK VIA NASAL PASSAGE  2011   INTRAOPERATIVE ARTERIOGRAM  CATH LAB  01-05-2009  DR Einar Gip   RICA   ACUTE ANGLE 80-85% STENOSIS   KNEE ARTHROSCOPY Left 07/15/2017   Procedure: ARTHROSCOPY LEFT  KNEE AND DEBRIDEMENT, medial meniscal tear and chondromalaita;  Surgeon: Ricky Cancel, MD;  Location: [facility];  Service: Orthopedics;  Laterality: Left;  [place]  03-26-2012   L3 --  L5   RADIOACTIVE PROSTATE SEED IMPLANTS  08-19-2003   SEPTOPLASTY  1998   TOTAL KNEE ARTHROPLASTY Left 02/25/2018   Procedure: LEFT TOTAL KNEE ARTHROPLASTY;  Surgeon: Ricky Cancel, MD;  Location: [facility];  Service:  Orthopedics;  Laterality: Left;  70 mins   TRANSURETHRAL RESECTION OF BLADDER TUMOR N/A 08/19/2013   Procedure: TRANSURETHRAL RESECTION OF BLADDER TUMOR (TURBT);  Surgeon: Molli Hazard, MD;  Location: [facility];  Service: Urology;  Laterality: N/A;   TRANSURETHRAL RESECTION OF BLADDER TUMOR N/A 07/18/2016   Procedure: TRANSURETHRAL RESECTION OF BLADDER TUMOR (TURBT);  Surgeon: Alexis Frock, MD;  Location: [facility];  Service: Urology;  Laterality: N/A;   YAG LASER APPLICATION  40/98/1191   Procedure: YAG LASER APPLICATION;  Surgeon: Elta Guadeloupe T. Gershon Crane, MD;  Location: [facility];  Service: Ophthalmology;  Laterality: Right;    Short Social History:  Social History   Tobacco Use   Smoking status: Every Day    Packs/day: 1.00    Years: 47.00    Total pack years: 47.00    Types: Cigarettes   Smokeless tobacco: Never   Tobacco comments:    pt quits smoking periodically :: started smoking again Jan 2018 1ppd  Substance Use Topics   Alcohol use: No    Alcohol/week: 0.0 standard drinks of alcohol    Allergies  Allergen Reactions   Dilaudid [Hydromorphone Hcl] Other (See Comments)    "acts a little off"   Toradol [Ketorolac Tromethamine] Other (See Comments)    CauseD  hallucination ?-states not sure/ avoids per his MD   Lyrica [Pregabalin] Nausea Only and Other (See Comments)    Make me feel "bad"    Current Outpatient Medications  Medication Sig Dispense Refill   allopurinol (ZYLOPRIM) 300 MG tablet Take 300 mg by mouth at bedtime.     amLODipine (NORVASC) 10 MG tablet TAKE 1 TABLET BY MOUTH EVERY DAY 90 tablet 3   aspirin 81 MG EC tablet Take 81 mg by mouth at bedtime.     celecoxib (CELEBREX) 200 MG capsule Take 200 mg by mouth 2 (two) times daily.      clopidogrel (PLAVIX) 75 MG tablet Take 75 mg by mouth daily.   6   colchicine 0.6 MG tablet Take 0.6 mg by mouth 2 (two) times daily as needed (gout).      cyclobenzaprine (FLEXERIL) 10 MG  tablet Take 10 mg by mouth 3 (three) times daily as needed.     docusate sodium (COLACE) 100 MG capsule Take 200 mg by mouth at bedtime.      ferrous sulfate (FERROUSUL) 325 (65 FE) MG tablet Take 1 tablet (325 mg total) by mouth 3 (three) times daily with meals.  3   finasteride (PROSCAR) 5 MG tablet Take 5 mg by mouth at bedtime.  furosemide (LASIX) 40 MG tablet Take 40 mg by mouth every morning.     gabapentin (NEURONTIN) 300 MG capsule Take 300 mg by mouth 3 (three) times daily.     HYDROcodone-acetaminophen (NORCO) 7.5-325 MG tablet Take 1-2 tablets by mouth every 4 (four) hours as needed for moderate pain. 60 tablet 0   labetalol (NORMODYNE) 300 MG tablet Take 300 mg by mouth 2 (two) times daily.     losartan (COZAAR) 100 MG tablet Take 1 tablet (100 mg total) by mouth every evening. 30 tablet 6   metFORMIN (GLUCOPHAGE-XR) 500 MG 24 hr tablet Take 2 tablets by mouth in the morning and at bedtime.     methocarbamol (ROBAXIN) 500 MG tablet Take 1 tablet (500 mg total) by mouth every 6 (six) hours as needed for muscle spasms. 40 tablet 0   Omega-3 Fatty Acids (FISH OIL) 1200 MG CAPS Take 1,200 mg by mouth 2 (two) times daily.     polyethylene glycol powder (GLYCOLAX/MIRALAX) powder TAKE '17MG'$ (1CAPFUL) BY MOUTH(MIXED WITH WATER/JUICE) DAILY 527 Ricky 0   rosuvastatin (CRESTOR) 10 MG tablet Take 10 mg by mouth every morning.      tamsulosin (FLOMAX) 0.4 MG CAPS capsule Take 0.4 mg by mouth daily.     traMADol (ULTRAM) 50 MG tablet Take 50 mg by mouth every 6 (six) hours as needed for pain.     No current facility-administered medications for this visit.    Review of Systems  Constitutional:  Constitutional negative. HENT: HENT negative.  Eyes: Eyes negative.  Respiratory: Respiratory negative.  Cardiovascular: Positive for claudication.  GI: Gastrointestinal negative.  Musculoskeletal: Musculoskeletal negative.  Skin: Skin negative.  Neurological: Neurological negative. Hematologic:  Hematologic/lymphatic negative.  Psychiatric: Psychiatric negative.        Objective:  Objective   Vitals:   03/28/22 1231  BP: (!) 142/73  Pulse: 66  Resp: 20  Temp: 98.3 F (36.8 C)  SpO2: 95%  Weight: 184 lb (83.5 kg)  Height: '5\' 7"'$  (1.702 m)   Body mass index is 28.82 kg/m.  Physical Exam HENT:     Head: Normocephalic.     Nose: Nose normal.  Eyes:     Pupils: Pupils are equal, round, and reactive to light.  Cardiovascular:     Pulses:          Radial pulses are 2+ on the right side and 2+ on the left side.  Pulmonary:     Effort: Pulmonary effort is normal.  Abdominal:     General: Abdomen is flat.     Palpations: Abdomen is soft.  Musculoskeletal:     Cervical back: Neck supple.     Right lower leg: No edema.     Left lower leg: No edema.  Skin:    General: Skin is warm and dry.     Capillary Refill: Capillary refill takes less than 2 seconds.  Neurological:     General: No focal deficit present.     Mental Status: He is alert.     Data: CTA IMPRESSION: 1. Atherosclerotic calcification right carotid bifurcation. Approximately 80% diameter stenosis proximal right internal carotid artery so should with kinking of the arteries 2. Atherosclerotic calcification left carotid bifurcation. There is a focal soft tissue web in the proximal left internal carotid artery with severe stenosis, greater than 80% diameter stenosis 3. Both vertebral arteries widely patent.     Assessment/Plan:    76 year old male with high-grade bilateral carotid artery stenosis the left is certainly greater than  the right but both duplex and CTA although the CTA is read as greater than 80% with a carotid web.  I discussed with the patient proceeding with transcarotid artery stenting given his history of cervical disc fusion.  He will continue aspirin Plavix and statin.  We discussed the risk benefits and alternatives he demonstrates good understanding.  We will proceed with cardiac  clearance with Dr. Harl Bowie and then with right transcarotid artery stenting.     Waynetta Sandy MD [facility]

## 2022-03-29 ENCOUNTER — Telehealth: Payer: Self-pay | Admitting: Cardiology

## 2022-03-29 NOTE — Telephone Encounter (Signed)
   Pre-operative Risk Assessment    Patient Name: [name]  DOB: 06-12-1946 MRN: [id number]      Request for Surgical Clearance    Procedure:   Right TCAR  Date of Surgery:  Clearance TBD                                 Surgeon:  Dr. Doristine Locks Group or Practice Name:  [facility]  Phone number:  [phone] Fax number:  [phone]   Type of Clearance Requested:   - Medical  - Pharmacy:  Hold Aspirin and Clopidogrel (Plavix) TBD by Cardiologist   Type of Anesthesia:  Not Indicated   Additional requests/questions:  Please fax a copy of medical clearance to the surgeon's office.  [name]   03/29/2022, 12:27 PM

## 2022-04-02 NOTE — Telephone Encounter (Signed)
   Patient Name: [name]  DOB: Apr 20, 1946 MRN: [id number]  Primary Cardiologist: Carlyle Dolly, MD  Chart reviewed as part of pre-operative protocol coverage.   Dr. Harl Bowie, you recently saw the patient. Can you provide recommendations regarding clearance and anti-platelet therapy? We can schedule phone visit if needed.   Please forward your response to [facility] PREOP.   Thank you   Leanor Kail, PA 04/02/2022, 11:11 AM

## 2022-04-09 NOTE — Telephone Encounter (Signed)
   Name: [name]  DOB: 1946/04/14  MRN: [id number]  Primary Cardiologist: Carlyle Dolly, MD   Preoperative team, please contact this patient and set up a phone call appointment for further preoperative risk assessment. Please obtain consent and complete medication review. Thank you for your help.  I confirm that guidance regarding antiplatelet and oral anticoagulation therapy has been completed and, if necessary, noted below.  Patient is on aspirin and Plavix presumably for history of carotid artery stenosis. Aspirin and Plavix are not prescribed by cardiology.  Therefore, recommendations for holding aspirin and Plavix prior to surgery should come from prescribing/managing provider.  [name], NP 04/09/2022, 11:04 AM [name] [address] [place], [place] [address]

## 2022-04-10 NOTE — Telephone Encounter (Signed)
Left message to call back for tele pre op appt 

## 2022-04-11 ENCOUNTER — Telehealth: Payer: Self-pay | Admitting: Cardiology

## 2022-04-11 ENCOUNTER — Telehealth: Payer: Self-pay | Admitting: *Deleted

## 2022-04-11 NOTE — Telephone Encounter (Signed)
Patient is calling to talk with the Pre-Op Team....please call back

## 2022-04-11 NOTE — Telephone Encounter (Signed)
Pt agreeable to plan of care for tele pre op appt 04/16/22 @ 2:20 pm. Med rec and consent are done.     Patient Consent for Virtual Visit        [name] has provided verbal consent on 04/11/2022 for a virtual visit (video or telephone).   CONSENT FOR VIRTUAL VISIT FOR:  [name]  By participating in this virtual visit I agree to the following:  I hereby voluntarily request, consent and authorize [facility] and its employed or contracted physicians, physician assistants, nurse practitioners or other licensed health care professionals (the Practitioner), to provide me with telemedicine health care services (the "Services") as deemed necessary by the treating Practitioner. I acknowledge and consent to receive the Services by the Practitioner via telemedicine. I understand that the telemedicine visit will involve communicating with the Practitioner through live audiovisual communication technology and the disclosure of certain medical information by electronic transmission. I acknowledge that I have been given the opportunity to request an in-person assessment or other available alternative prior to the telemedicine visit and am voluntarily participating in the telemedicine visit.  I understand that I have the right to withhold or withdraw my consent to the use of telemedicine in the course of my care at any time, without affecting my right to future care or treatment, and that the Practitioner or I may terminate the telemedicine visit at any time. I understand that I have the right to inspect all information obtained and/or recorded in the course of the telemedicine visit and may receive copies of available information for a reasonable fee.  I understand that some of the potential risks of receiving the Services via telemedicine include:  Delay or interruption in medical evaluation due to technological equipment failure or disruption; Information transmitted may not be sufficient (e.g.  poor resolution of images) to allow for appropriate medical decision making by the Practitioner; and/or  In rare instances, security protocols could fail, causing a breach of personal health information.  Furthermore, I acknowledge that it is my responsibility to provide information about my medical history, conditions and care that is complete and accurate to the best of my ability. I acknowledge that Practitioner's advice, recommendations, and/or decision may be based on factors not within their control, such as incomplete or inaccurate data provided by me or distortions of diagnostic images or specimens that may result from electronic transmissions. I understand that the practice of medicine is not an exact science and that Practitioner makes no warranties or guarantees regarding treatment outcomes. I acknowledge that a copy of this consent can be made available to me via my patient portal ([facility]), or I can request a printed copy by calling the office of [facility].    I understand that my insurance will be billed for this visit.   I have read or had this consent read to me. I understand the contents of this consent, which adequately explains the benefits and risks of the Services being provided via telemedicine.  I have been provided ample opportunity to ask questions regarding this consent and the Services and have had my questions answered to my satisfaction. I give my informed consent for the services to be provided through the use of telemedicine in my medical care

## 2022-04-11 NOTE — Telephone Encounter (Signed)
Pt agreeable to plan of care for tele pre op appt 04/16/22 @ 2:20 pm. Med rec and consent are done.

## 2022-04-16 ENCOUNTER — Ambulatory Visit (INDEPENDENT_AMBULATORY_CARE_PROVIDER_SITE_OTHER): Payer: Medicare HMO | Admitting: General Practice

## 2022-04-16 DIAGNOSIS — Z01810 Encounter for preprocedural cardiovascular examination: Secondary | ICD-10-CM

## 2022-04-16 NOTE — Progress Notes (Signed)
Virtual Visit via Telephone Note   Because of [name]'s co-morbid illnesses, he is at least at moderate risk for complications without adequate follow up.  This format is felt to be most appropriate for this patient at this time.  The patient did not have access to video technology/had technical difficulties with video requiring transitioning to audio format only (telephone).  All issues noted in this document were discussed and addressed.  No physical exam could be performed with this format.  Please refer to the patient's chart for his consent to telehealth for [facility].  Evaluation Performed:  Preoperative cardiovascular risk assessment _____________   Date:  04/16/2022   Patient ID:  [name], [name], [name] Patient Location:  Home Provider location:   Office  Primary Care Provider:  [name], MD Primary Cardiologist:  Carlyle Dolly, MD  Chief Complaint / Patient Profile   76 y.o. y/o male with a h/o hypertension, peripheral arterial disease, coronary artery disease who is pending right TCAR and presents today for telephonic preoperative cardiovascular risk assessment.  Past Medical History    Past Medical History:  Diagnosis Date   Bilateral renal cysts    Bladder cancer ([place]) UROLOGIST-  DR [facility]   RECURRENT 11/ 2017 --  hx turbt in 2004 and 08-19-2013   BPH without obstruction/lower urinary tract symptoms    Cervical fusion syndrome    LEFT ARM NUMBNESS--  CONTROLLED W/ GABAPENTIN   Coronary artery disease    CARDIOLOGIST-- DR Harl Bowie [name])   DDD (degenerative disc disease), cervical    DDD (degenerative disc disease), lumbar    Diverticulosis of sigmoid colon    History of acute gouty arthritis    History of adenomatous polyp of colon    2001;  2012- non-malignant leiomyoma;  04-07-2014  tubualr adenoma and hyperplastic polyp's   History of prostate cancer UROLOGIST-  DR [facility]   S/P  RADIOACTIVE SEED IMPLANTS  2004   HOH (hard of hearing)    no hearing aids   Hyperlipidemia    Hypertension    OA (osteoarthritis)    Occlusion and stenosis of carotid artery without mention of cerebral infarction CARDIOLOGIST  -- DR  [name]   LAST DUPLEX  11-23-2015  RICA  (DUPLEX DOPPLER 01-20-2016 RICA 79-02% PROXIMAL) /   LICA <40% (HAD CONSULT W/ [name] , VASCULAR SURGEON 01-20-2016)   OSA on CPAP    BiPAP   Spondylosis, cervical    Type 2 diabetes mellitus ([place])    Wears glasses    Past Surgical History:  Procedure Laterality Date   ANTERIOR REMOVAL CAGE AND PLATE C3-C7/ CORPECTOMY C7 (FX)/  ALLOGRAFT AND FUSION C3 -- T1/  POSTERIOR DECOMPRESSION BILATERAL LAMINECTOMY C4 -- 6 & PARTIAL C3/  POSTEROLATERAL ARTHRODESIS C3 - T1  06/05/2005   APPENDECTOMY  1990'S   CATARACT EXTRACTION W/ INTRAOCULAR LENS  IMPLANT, BILATERAL  2013   CERVICAL FUSION  05/11/2005   C3  --  C7/  due to post op quadriparesis same day s/p  anterior C 4,5,6, colpectomy, decompression, removal epidural hematoma, foraminnotomy, cage and plate   COLONOSCOPY  last one 04-07-2014   CYSTOSCOPY W/ RETROGRADES Bilateral 08/19/2013   Procedure: CYSTOSCOPY WITH RETROGRADE PYELOGRAM;  Surgeon: Molli Hazard, MD;  Location: [facility];  Service: Urology;  Laterality: Bilateral;   CYSTOSCOPY W/ RETROGRADES Bilateral 07/18/2016   Procedure: CYSTOSCOPY WITH RETROGRADE PYELOGRAM;  Surgeon: Alexis Frock, MD;  Location: [facility];  Service: Urology;  Laterality: Bilateral;   ENDOSCOPIC REPAIR CSF LEAK VIA NASAL PASSAGE  2011   INTRAOPERATIVE ARTERIOGRAM  CATH LAB  01-05-2009  DR Einar Gip   RICA   ACUTE ANGLE 80-85% STENOSIS   KNEE ARTHROSCOPY Left 07/15/2017   Procedure: ARTHROSCOPY LEFT  KNEE AND DEBRIDEMENT, medial meniscal tear and chondromalaita;  Surgeon: Paralee Cancel, MD;  Location: [facility];  Service: Orthopedics;  Laterality: Left;  [place]  03-26-2012   L3 --  L5   RADIOACTIVE  PROSTATE SEED IMPLANTS  08-19-2003   SEPTOPLASTY  1998   TOTAL KNEE ARTHROPLASTY Left 02/25/2018   Procedure: LEFT TOTAL KNEE ARTHROPLASTY;  Surgeon: Paralee Cancel, MD;  Location: [facility];  Service: Orthopedics;  Laterality: Left;  70 mins   TRANSURETHRAL RESECTION OF BLADDER TUMOR N/A 08/19/2013   Procedure: TRANSURETHRAL RESECTION OF BLADDER TUMOR (TURBT);  Surgeon: Molli Hazard, MD;  Location[facility];  Service: Urology;  Laterality: N/A;   TRANSURETHRAL RESECTION OF BLADDER TUMOR N/A 07/18/2016   Procedure: TRANSURETHRAL RESECTION OF BLADDER TUMOR (TURBT);  Surgeon: Alexis Frock, MD;  Location: [facility];  Service: Urology;  Laterality: N/A;   YAG LASER APPLICATION  58/52/7782   Procedure: YAG LASER APPLICATION;  Surgeon: Elta Guadeloupe T. Gershon Crane, MD;  Location: [facility];  Service: Ophthalmology;  Laterality: Right;    Allergies  Allergies  Allergen Reactions   Dilaudid [Hydromorphone Hcl] Other (See Comments)    "acts a little off"   Toradol [Ketorolac Tromethamine] Other (See Comments)    CauseD  hallucination ?-states not sure/ avoids per his MD   Lyrica [Pregabalin] Nausea Only and Other (See Comments)    Make me feel "bad"    History of Present Illness    [name] is a 76 y.o. male who presents via audio/video conferencing for a telehealth visit today.  Pt was last seen in cardiology clinic on 01/23/2022 by Dr. Harl Bowie.  At that time [name] was doing well .  The patient is now pending procedure as outlined above. Since his last visit, he continues to be stable from a cardiac standpoint.  Today he denies chest pain, shortness of breath, lower extremity edema, fatigue, palpitations, melena, hematuria, hemoptysis, diaphoresis, weakness, presyncope, syncope, orthopnea, and PND.    Home Medications    Prior to Admission medications   Medication Sig Start Date End Date Taking? Authorizing Provider  allopurinol (ZYLOPRIM) 300 MG tablet  Take 300 mg by mouth at bedtime.    [provider]  amLODipine (NORVASC) 10 MG tablet TAKE 1 TABLET BY MOUTH EVERY DAY 07/24/18   Arnoldo Lenis, MD  aspirin 81 MG EC tablet Take 81 mg by mouth at bedtime.    [provider]  celecoxib (CELEBREX) 200 MG capsule Take 200 mg by mouth 2 (two) times daily.     [provider]  clopidogrel (PLAVIX) 75 MG tablet Take 75 mg by mouth daily.  07/11/16   [provider]  colchicine 0.6 MG tablet Take 0.6 mg by mouth 2 (two) times daily as needed (gout).     [provider]  cyclobenzaprine (FLEXERIL) 10 MG tablet Take 10 mg by mouth 3 (three) times daily as needed. 10/17/19   [provider]  docusate sodium (COLACE) 100 MG capsule Take 200 mg by mouth at bedtime.     [provider]  ferrous sulfate (FERROUSUL) 325 (65 FE) MG tablet Take 1 tablet (325  mg total) by mouth 3 (three) times daily with meals. 02/25/18   Danae Orleans, PA-C  finasteride (PROSCAR) 5 MG tablet Take 5 mg by mouth at bedtime.    [provider]  furosemide (LASIX) 40 MG tablet Take 40 mg by mouth every morning.    [provider]  gabapentin (NEURONTIN) 300 MG capsule Take 300 mg by mouth 3 (three) times daily.    [provider]  HYDROcodone-acetaminophen (NORCO) 7.5-325 MG tablet Take 1-2 tablets by mouth every 4 (four) hours as needed for moderate pain. 02/25/18   Danae Orleans, PA-C  labetalol (NORMODYNE) 300 MG tablet Take 300 mg by mouth 2 (two) times daily.    [provider]  losartan (COZAAR) 100 MG tablet Take 1 tablet (100 mg total) by mouth every evening. 09/22/20   Arnoldo Lenis, MD  metFORMIN (GLUCOPHAGE-XR) 500 MG 24 hr tablet Take 2 tablets by mouth in the morning and at bedtime. 09/05/20   [provider]  methocarbamol (ROBAXIN) 500 MG tablet Take 1 tablet (500 mg total) by mouth every 6 (six) hours as needed for muscle spasms. 02/25/18   Danae Orleans,  PA-C  Omega-3 Fatty Acids (FISH OIL) 1200 MG CAPS Take 1,200 mg by mouth 2 (two) times daily.    [provider]  polyethylene glycol powder (GLYCOLAX/MIRALAX) powder TAKE '17MG'$ (1CAPFUL) BY MOUTH(MIXED WITH WATER/JUICE) DAILY 05/02/15   Lafayette Dragon, MD  rosuvastatin (CRESTOR) 10 MG tablet Take 10 mg by mouth every morning.     [provider]  tamsulosin (FLOMAX) 0.4 MG CAPS capsule Take 0.4 mg by mouth daily. 09/05/20   [provider]  traMADol (ULTRAM) 50 MG tablet Take 50 mg by mouth every 6 (six) hours as needed for pain. 06/09/20   [provider]    Physical Exam    Vital Signs:  [name] does not have vital signs available for review today.  Given telephonic nature of communication, physical exam is limited. AAOx3. NAD. Normal affect.  Speech and respirations are unlabored.  Accessory Clinical Findings    None  Assessment & Plan    1.  Preoperative Cardiovascular Risk Assessment:-Right TCAR, Dr. Donzetta Matters, fax #[phone]     Primary Cardiologist: Carlyle Dolly, MD  Chart reviewed as part of pre-operative protocol coverage. Given past medical history and time since last visit, based on ACC/AHA guidelines, [name] would be at acceptable risk for the planned procedure without further cardiovascular testing.   Patient was advised that if he develops new symptoms prior to surgery to contact our office to arrange a follow-up appointment.  He verbalized understanding.  He is able to complete greater than 4 METS of physical activity.  Patient is on aspirin and Plavix presumably for history of carotid artery stenosis. Aspirin and Plavix are not prescribed by cardiology.  Therefore, recommendations for holding aspirin and Plavix prior to surgery should come from prescribing/managing provider.    A copy of this note will be routed to requesting surgeon.  Time:   Today, I have spent 8  minutes with the patient with telehealth  technology discussing medical history, symptoms, and management plan.     Deberah Pelton, NP  04/16/2022, 8:11 AM

## 2022-04-24 ENCOUNTER — Other Ambulatory Visit: Payer: Self-pay

## 2022-04-24 DIAGNOSIS — I6521 Occlusion and stenosis of right carotid artery: Secondary | ICD-10-CM

## 2022-05-04 NOTE — Pre-Procedure Instructions (Signed)
Surgical Instructions    Your procedure is scheduled on Friday, August 25th.  Report to [facility] Main Entrance "A" at 05:30 A.M., then check in with the Admitting office.  Call this number if you have problems the morning of surgery:  [phone]   If you have any questions prior to your surgery date call [phone]: Open Monday-Friday 8am-4pm    Remember:  Do not eat or drink after midnight the night before your surgery     Take these medicines the morning of surgery with A SIP OF WATER  amLODipine (NORVASC)  gabapentin (NEURONTIN) labetalol (NORMODYNE)  rosuvastatin (CRESTOR)  tamsulosin (FLOMAX)   If needed: acetaminophen (TYLENOL)  colchicine  WHAT DO I DO ABOUT MY DIABETES MEDICATION?   Do not take metFORMIN (GLUCOPHAGE-XR)  the morning of surgery.     HOW TO MANAGE YOUR DIABETES BEFORE AND AFTER SURGERY  Why is it important to control my blood sugar before and after surgery? Improving blood sugar levels before and after surgery helps healing and can limit problems. A way of improving blood sugar control is eating a healthy diet by:  Eating less sugar and carbohydrates  Increasing activity/exercise  Talking with your doctor about reaching your blood sugar goals High blood sugars (greater than 180 mg/dL) can raise your risk of infections and slow your recovery, so you will need to focus on controlling your diabetes during the weeks before surgery. Make sure that the doctor who takes care of your diabetes knows about your planned surgery including the date and location.  How do I manage my blood sugar before surgery? Check your blood sugar at least 4 times a day, starting 2 days before surgery, to make sure that the level is not too high or low.  Check your blood sugar the morning of your surgery when you wake up and every 2 hours until you get to the Short Stay unit.  If your blood sugar is less than 70 mg/dL, you will need to treat for low blood sugar: Do  not take insulin. Treat a low blood sugar (less than 70 mg/dL) with  cup of clear juice (cranberry or apple), 4 glucose tablets, OR glucose gel. Recheck blood sugar in 15 minutes after treatment (to make sure it is greater than 70 mg/dL). If your blood sugar is not greater than 70 mg/dL on recheck, call [phone] for further instructions. Report your blood sugar to the short stay nurse when you get to Short Stay.  If you are admitted to the hospital after surgery: Your blood sugar will be checked by the staff and you will probably be given insulin after surgery (instead of oral diabetes medicines) to make sure you have good blood sugar levels. The goal for blood sugar control after surgery is 80-180 mg/dL.   Follow your surgeon's instructions on when to stop Aspirin and Plavix.  If no instructions were given by your surgeon then you will need to call the office to get those instructions.     As of today, STOP taking any Aspirin (unless otherwise instructed by your surgeon) Aleve, Naproxen, Ibuprofen, Motrin, Advil, Goody's, BC's, all herbal medications, fish oil, and all vitamins. This includes celecoxib (CELEBREX)                      Do NOT Smoke (Tobacco/Vaping) for 24 hours prior to your procedure.  If you use a CPAP at night, you may bring your mask/headgear for your overnight stay.   Contacts,  glasses, piercing's, hearing aid's, dentures or partials may not be worn into surgery, please bring cases for these belongings.    For patients admitted to the hospital, discharge time will be determined by your treatment team.   Patients discharged the day of surgery will not be allowed to drive home, and someone needs to stay with them for 24 hours.  SURGICAL WAITING ROOM VISITATION Patients having surgery or a procedure may have no more than 2 support people in the waiting area - these visitors may rotate.   Children under the age of 37 must have an adult with them who is not the  patient. If the patient needs to stay at the hospital during part of their recovery, the visitor guidelines for inpatient rooms apply. Pre-op nurse will coordinate an appropriate time for 1 support person to accompany patient in pre-op.  This support person may not rotate.   Please refer to the [facility] website for the visitor guidelines for Inpatients (after your surgery is over and you are in a regular room).    Special instructions:   [place]- Preparing For Surgery  Before surgery, you can play an important role. Because skin is not sterile, your skin needs to be as free of germs as possible. You can reduce the number of germs on your skin by washing with CHG (chlorahexidine gluconate) Soap before surgery.  CHG is an antiseptic cleaner which kills germs and bonds with the skin to continue killing germs even after washing.    Oral Hygiene is also important to reduce your risk of infection.  Remember - BRUSH YOUR TEETH THE MORNING OF SURGERY WITH YOUR REGULAR TOOTHPASTE  Please do not use if you have an allergy to CHG or antibacterial soaps. If your skin becomes reddened/irritated stop using the CHG.  Do not shave (including legs and underarms) for at least 48 hours prior to first CHG shower. It is OK to shave your face.  Please follow these instructions carefully.   Shower the NIGHT BEFORE SURGERY and the MORNING OF SURGERY  If you chose to wash your hair, wash your hair first as usual with your normal shampoo.  After you shampoo, rinse your hair and body thoroughly to remove the shampoo.  Use CHG Soap as you would any other liquid soap. You can apply CHG directly to the skin and wash gently with a scrungie or a clean washcloth.   Apply the CHG Soap to your body ONLY FROM THE NECK DOWN.  Do not use on open wounds or open sores. Avoid contact with your eyes, ears, mouth and genitals (private parts). Wash Face and genitals (private parts)  with your normal soap.   Wash thoroughly,  paying special attention to the area where your surgery will be performed.  Thoroughly rinse your body with warm water from the neck down.  DO NOT shower/wash with your normal soap after using and rinsing off the CHG Soap.  Pat yourself dry with a CLEAN TOWEL.  Wear CLEAN PAJAMAS to bed the night before surgery  Place CLEAN SHEETS on your bed the night before your surgery  DO NOT SLEEP WITH PETS.   Day of Surgery: Take a shower with CHG soap. Do not wear jewelry  Do not wear lotions, powders, colognes, or deodorant. Do not shave 48 hours prior to surgery.   Do not bring valuables to the hospital. [facility] is not responsible for any belongings or valuables.  Wear Clean/Comfortable clothing the morning of surgery Remember  to brush your teeth WITH YOUR REGULAR TOOTHPASTE.   Please read over the following fact sheets that you were given.    If you received a COVID test during your pre-op visit  it is requested that you wear a mask when out in public, stay away from anyone that may not be feeling well and notify your surgeon if you develop symptoms. If you have been in contact with anyone that has tested positive in the last 10 days please notify you surgeon.

## 2022-05-07 ENCOUNTER — Other Ambulatory Visit: Payer: Self-pay

## 2022-05-07 ENCOUNTER — Encounter (HOSPITAL_COMMUNITY)
Admission: RE | Admit: 2022-05-07 | Discharge: 2022-05-07 | Disposition: A | Discharge status: Home / Self Care | Payer: Medicare HMO | Source: Ambulatory Visit | Attending: Vascular Surgery | Admitting: Vascular Surgery

## 2022-05-07 ENCOUNTER — Encounter (HOSPITAL_COMMUNITY): Payer: Self-pay

## 2022-05-07 VITALS — BP 168/77 | HR 68 | Temp 98.0°F | Resp 17 | Ht 67.0 in | Wt 187.9 lb

## 2022-05-07 DIAGNOSIS — I6521 Occlusion and stenosis of right carotid artery: Secondary | ICD-10-CM | POA: Diagnosis not present

## 2022-05-07 DIAGNOSIS — Z01818 Encounter for other preprocedural examination: Secondary | ICD-10-CM | POA: Diagnosis not present

## 2022-05-07 DIAGNOSIS — E119 Type 2 diabetes mellitus without complications: Secondary | ICD-10-CM | POA: Insufficient documentation

## 2022-05-07 HISTORY — DX: Malignant neoplasm of prostate: C61

## 2022-05-07 HISTORY — DX: Cardiac murmur, unspecified: R01.1

## 2022-05-07 LAB — URINALYSIS, ROUTINE W REFLEX MICROSCOPIC
Bilirubin Urine: NEGATIVE
Glucose, UA: NEGATIVE mg/dL
Hgb urine dipstick: NEGATIVE
Ketones, ur: NEGATIVE mg/dL
Leukocytes,Ua: NEGATIVE
Nitrite: NEGATIVE
Protein, ur: NEGATIVE mg/dL
Specific Gravity, Urine: 1.01 (ref 1.005–1.030)
pH: 6 (ref 5.0–8.0)

## 2022-05-07 LAB — COMPREHENSIVE METABOLIC PANEL
ALT: 20 U/L (ref 0–44)
AST: 20 U/L (ref 15–41)
Albumin: 4.1 g/dL (ref 3.5–5.0)
Alkaline Phosphatase: 48 U/L (ref 38–126)
Anion gap: 10 (ref 5–15)
BUN: 17 mg/dL (ref 8–23)
CO2: 23 mmol/L (ref 22–32)
Calcium: 9.3 mg/dL (ref 8.9–10.3)
Chloride: 107 mmol/L (ref 98–111)
Creatinine, Ser: 1.11 mg/dL (ref 0.61–1.24)
GFR, Estimated: >60 mL/min (ref >60)
Glucose, Bld: 127 mg/dL — ABNORMAL HIGH (ref 70–99)
Potassium: 4.5 mmol/L (ref 3.5–5.1)
Sodium: 140 mmol/L (ref 135–145)
Total Bilirubin: 0.7 mg/dL (ref 0.3–1.2)
Total Protein: 6.8 g/dL (ref 6.5–8.1)

## 2022-05-07 LAB — CBC
HCT: 36.3 % — ABNORMAL LOW (ref 39.0–52.0)
Hemoglobin: 12.2 g/dL — ABNORMAL LOW (ref 13.0–17.0)
MCH: 31.2 pg (ref 26.0–34.0)
MCHC: 33.6 g/dL (ref 30.0–36.0)
MCV: 92.8 fL (ref 80.0–100.0)
Platelets: 158 10*3/uL (ref 150–400)
RBC: 3.91 MIL/uL — ABNORMAL LOW (ref 4.22–5.81)
RDW: 13.5 % (ref 11.5–15.5)
WBC: 7 10*3/uL (ref 4.0–10.5)
nRBC: 0 % (ref 0.0–0.2)

## 2022-05-07 LAB — APTT: aPTT: 33 seconds (ref 24–36)

## 2022-05-07 LAB — TYPE AND SCREEN
ABO/RH(D): O POS
Antibody Screen: NEGATIVE

## 2022-05-07 LAB — PROTIME-INR
INR: 1.1 (ref 0.8–1.2)
Prothrombin Time: 13.6 seconds (ref 11.4–15.2)

## 2022-05-07 LAB — HEMOGLOBIN A1C
Hgb A1c MFr Bld: 5.9 % — ABNORMAL HIGH (ref 4.8–5.6)
Mean Plasma Glucose: 122.63 mg/dL

## 2022-05-07 LAB — SURGICAL PCR SCREEN
MRSA, PCR: NEGATIVE
Staphylococcus aureus: NEGATIVE

## 2022-05-07 LAB — GLUCOSE, CAPILLARY: Glucose-Capillary: 137 mg/dL — ABNORMAL HIGH (ref 70–99)

## 2022-05-07 NOTE — Progress Notes (Signed)
PCP - [name], MD Cardiologist - Ricky Bowie Alphonse Guild, MD- clearance note from 04/16/22 telehealth visit with [name], NP  PPM/ICD - denies  Chest x-ray - N/A EKG - 06/16/21 Stress Test - 06/07/17 ECHO - 08/24/21 Cardiac Cath - pt denies  Sleep Study - uses BIPAP at home- sleep study 03/17/2002- pt does not know settings  Fasting Blood Sugar - 137 at PAT appt Pt states he "seldomly" checks blood sugar at home. Unsure of last HgbA1c  Blood Thinner Instructions: Follow your surgeon's instructions on whether to stop Aspirin and Plavix prior to surgery.  If no instructions were given by your surgeon then you will need to call the office to get those instructions.   Patient states he was not told whether to stop or keep taking ASA and Plavix. Per Dr Claretha Cooper note pt to continue taking. Pt states he will call office to confirm this.   ERAS Protcol - NPO order  COVID TEST- N/A  Anesthesia review:   Patient denies shortness of breath, fever, cough and chest pain at PAT appointment   All instructions explained to the patient, with a verbal understanding of the material. Patient agrees to go over the instructions while at home for a better understanding. The opportunity to ask questions was provided.

## 2022-05-07 NOTE — Pre-Procedure Instructions (Signed)
Surgical Instructions    Your procedure is scheduled on Friday, August 25th.  Report to [facility] Main Entrance "A" at 05:30 A.M., then check in with the Admitting office.  Call this number if you have problems the morning of surgery:  [phone]   If you have any questions prior to your surgery date call [phone]: Open Monday-Friday 8am-4pm    Remember:  Do not eat or drink after midnight the night before your surgery     Take these medicines the morning of surgery with A SIP OF WATER  amLODipine (NORVASC)  gabapentin (NEURONTIN) labetalol (NORMODYNE)  rosuvastatin (CRESTOR)  tamsulosin (FLOMAX)   If needed: acetaminophen (TYLENOL)  Colchicine   Follow your surgeon's instructions on whether to stop Aspirin and Plavix prior to surgery.  If no instructions were given by your surgeon then you will need to call the office to get those instructions.     As of today, STOP taking any Aleve, Naproxen, Ibuprofen, Motrin, Advil, Goody's, BC's, all herbal medications, fish oil, and all vitamins. This includes celecoxib (CELEBREX)   WHAT DO I DO ABOUT MY DIABETES MEDICATION?  Do not take metFORMIN (GLUCOPHAGE-XR)  the morning of surgery.     HOW TO MANAGE YOUR DIABETES BEFORE AND AFTER SURGERY  Why is it important to control my blood sugar before and after surgery? Improving blood sugar levels before and after surgery helps healing and can limit problems. A way of improving blood sugar control is eating a healthy diet by:  Eating less sugar and carbohydrates  Increasing activity/exercise  Talking with your doctor about reaching your blood sugar goals High blood sugars (greater than 180 mg/dL) can raise your risk of infections and slow your recovery, so you will need to focus on controlling your diabetes during the weeks before surgery. Make sure that the doctor who takes care of your diabetes knows about your planned surgery including the date and location.  How do I  manage my blood sugar before surgery? Check your blood sugar at least 4 times a day, starting 2 days before surgery, to make sure that the level is not too high or low.  Check your blood sugar the morning of your surgery when you wake up and every 2 hours until you get to the Short Stay unit.  If your blood sugar is less than 70 mg/dL, you will need to treat for low blood sugar: Do not take insulin. Treat a low blood sugar (less than 70 mg/dL) with  cup of clear juice (cranberry or apple), 4 glucose tablets, OR glucose gel. Recheck blood sugar in 15 minutes after treatment (to make sure it is greater than 70 mg/dL). If your blood sugar is not greater than 70 mg/dL on recheck, call [phone] for further instructions. Report your blood sugar to the short stay nurse when you get to Short Stay.  If you are admitted to the hospital after surgery: Your blood sugar will be checked by the staff and you will probably be given insulin after surgery (instead of oral diabetes medicines) to make sure you have good blood sugar levels. The goal for blood sugar control after surgery is 80-180 mg/dL.                       Do NOT Smoke (Tobacco/Vaping) for 24 hours prior to your procedure.  If you use a CPAP at night, you may bring your mask/headgear for your overnight stay.   Contacts, glasses, piercing's, hearing  aid's, dentures or partials may not be worn into surgery, please bring cases for these belongings.    For patients admitted to the hospital, discharge time will be determined by your treatment team.   Patients discharged the day of surgery will not be allowed to drive home, and someone needs to stay with them for 24 hours.  SURGICAL WAITING ROOM VISITATION Patients having surgery or a procedure may have no more than 2 support people in the waiting area - these visitors may rotate.   Children under the age of 109 must have an adult with them who is not the patient. If the patient needs to  stay at the hospital during part of their recovery, the visitor guidelines for inpatient rooms apply. Pre-op nurse will coordinate an appropriate time for 1 support person to accompany patient in pre-op.  This support person may not rotate.   Please refer to the [facility] website for the visitor guidelines for Inpatients (after your surgery is over and you are in a regular room).    Special instructions:   [facility]- Preparing For Surgery  Before surgery, you can play an important role. Because skin is not sterile, your skin needs to be as free of germs as possible. You can reduce the number of germs on your skin by washing with CHG (chlorahexidine gluconate) Soap before surgery.  CHG is an antiseptic cleaner which kills germs and bonds with the skin to continue killing germs even after washing.    Oral Hygiene is also important to reduce your risk of infection.  Remember - BRUSH YOUR TEETH THE MORNING OF SURGERY WITH YOUR REGULAR TOOTHPASTE  Please do not use if you have an allergy to CHG or antibacterial soaps. If your skin becomes reddened/irritated stop using the CHG.  Do not shave (including legs and underarms) for at least 48 hours prior to first CHG shower. It is OK to shave your face.  Please follow these instructions carefully.   Shower the NIGHT BEFORE SURGERY and the MORNING OF SURGERY  If you chose to wash your hair, wash your hair first as usual with your normal shampoo.  After you shampoo, rinse your hair and body thoroughly to remove the shampoo.  Use CHG Soap as you would any other liquid soap. You can apply CHG directly to the skin and wash gently with a scrungie or a clean washcloth.   Apply the CHG Soap to your body ONLY FROM THE NECK DOWN.  Do not use on open wounds or open sores. Avoid contact with your eyes, ears, mouth and genitals (private parts). Wash Face and genitals (private parts)  with your normal soap.   Wash thoroughly, paying special attention to the  area where your surgery will be performed.  Thoroughly rinse your body with warm water from the neck down.  DO NOT shower/wash with your normal soap after using and rinsing off the CHG Soap.  Pat yourself dry with a CLEAN TOWEL.  Wear CLEAN PAJAMAS to bed the night before surgery  Place CLEAN SHEETS on your bed the night before your surgery  DO NOT SLEEP WITH PETS.   Day of Surgery: Take a shower with CHG soap. Do not wear jewelry  Do not wear lotions, powders, colognes, or deodorant. Do not shave 48 hours prior to surgery.   Do not bring valuables to the hospital. [facility] is not responsible for any belongings or valuables.  Wear Clean/Comfortable clothing the morning of surgery Remember to brush your  teeth WITH YOUR REGULAR TOOTHPASTE.   Please read over the following fact sheets that you were given.    If you received a COVID test during your pre-op visit  it is requested that you wear a mask when out in public, stay away from anyone that may not be feeling well and notify your surgeon if you develop symptoms. If you have been in contact with anyone that has tested positive in the last 10 days please notify you surgeon.

## 2022-05-10 NOTE — Anesthesia Preprocedure Evaluation (Addendum)
Anesthesia Evaluation  Patient identified by MRN, date of birth, ID band Patient awake    Reviewed: Allergy & Precautions, NPO status , Patient's Chart, lab work & pertinent test results, reviewed documented beta blocker date and time   History of Anesthesia Complications Negative for: history of anesthetic complications  Airway Mallampati: II  TM Distance: >3 FB Neck ROM: Limited    Dental  (+) Edentulous Upper,    Pulmonary sleep apnea and Continuous Positive Airway Pressure Ventilation , Current Smoker,    Pulmonary exam normal        Cardiovascular hypertension, Pt. on medications and Pt. on home beta blockers + CAD and + Peripheral Vascular Disease  Normal cardiovascular exam  TTE 08/2021: EF 65-70%, mild LVH, grade I DD, mild MR, moderate AS  Carotid artery stenosis (right 80-99%, left 60-79%)   Neuro/Psych S/p cervical fusion negative psych ROS   GI/Hepatic negative GI ROS, Neg liver ROS,   Endo/Other  diabetes, Type 2, Oral Hypoglycemic Agents  Renal/GU negative Renal ROS  negative genitourinary   Musculoskeletal  (+) Arthritis ,   Abdominal   Peds  Hematology  (+) Blood dyscrasia (Hgb 12.2), anemia ,   Anesthesia Other Findings Day of surgery medications reviewed with patient.  Reproductive/Obstetrics negative OB ROS                           Anesthesia Physical Anesthesia Plan  ASA: 3  Anesthesia Plan: General   Post-op Pain Management: Tylenol PO (pre-op)*   Induction: Intravenous  PONV Risk Score and Plan: 2 and Treatment may vary due to age or medical condition, Ondansetron and Dexamethasone  Airway Management Planned: Oral ETT  Additional Equipment: Arterial line  Intra-op Plan:   Post-operative Plan: Extubation in OR  Informed Consent: I have reviewed the patients History and Physical, chart, labs and discussed the procedure including the risks, benefits and  alternatives for the proposed anesthesia with the patient or authorized representative who has indicated his/her understanding and acceptance.     Dental advisory given  Plan Discussed with: CRNA  Anesthesia Plan Comments:        Anesthesia Quick Evaluation

## 2022-05-11 ENCOUNTER — Inpatient Hospital Stay (HOSPITAL_COMMUNITY): Payer: Medicare HMO | Admitting: Physician Assistant

## 2022-05-11 ENCOUNTER — Inpatient Hospital Stay (HOSPITAL_COMMUNITY): Payer: Medicare HMO

## 2022-05-11 ENCOUNTER — Encounter (HOSPITAL_COMMUNITY): Payer: Self-pay | Admitting: Vascular Surgery

## 2022-05-11 ENCOUNTER — Inpatient Hospital Stay (HOSPITAL_COMMUNITY)
Admission: RE | Admit: 2022-05-11 | Discharge: 2022-05-12 | DRG: 036 | Disposition: A | Discharge status: Home / Self Care | Payer: Medicare HMO | Source: Ambulatory Visit | Attending: Vascular Surgery | Admitting: Vascular Surgery

## 2022-05-11 ENCOUNTER — Other Ambulatory Visit: Payer: Self-pay

## 2022-05-11 ENCOUNTER — Encounter (HOSPITAL_COMMUNITY)
Admission: RE | Disposition: A | Discharge status: Home / Self Care | Payer: Self-pay | Source: Ambulatory Visit | Attending: Vascular Surgery

## 2022-05-11 DIAGNOSIS — I6529 Occlusion and stenosis of unspecified carotid artery: Secondary | ICD-10-CM | POA: Diagnosis present

## 2022-05-11 DIAGNOSIS — Z791 Long term (current) use of non-steroidal anti-inflammatories (NSAID): Secondary | ICD-10-CM | POA: Diagnosis not present

## 2022-05-11 DIAGNOSIS — Z833 Family history of diabetes mellitus: Secondary | ICD-10-CM | POA: Diagnosis not present

## 2022-05-11 DIAGNOSIS — Z886 Allergy status to analgesic agent status: Secondary | ICD-10-CM

## 2022-05-11 DIAGNOSIS — I10 Essential (primary) hypertension: Secondary | ICD-10-CM | POA: Diagnosis present

## 2022-05-11 DIAGNOSIS — Z885 Allergy status to narcotic agent status: Secondary | ICD-10-CM | POA: Diagnosis not present

## 2022-05-11 DIAGNOSIS — I6521 Occlusion and stenosis of right carotid artery: Secondary | ICD-10-CM

## 2022-05-11 DIAGNOSIS — Z7982 Long term (current) use of aspirin: Secondary | ICD-10-CM

## 2022-05-11 DIAGNOSIS — Z7901 Long term (current) use of anticoagulants: Secondary | ICD-10-CM | POA: Diagnosis not present

## 2022-05-11 DIAGNOSIS — F17210 Nicotine dependence, cigarettes, uncomplicated: Secondary | ICD-10-CM | POA: Diagnosis present

## 2022-05-11 DIAGNOSIS — Z981 Arthrodesis status: Secondary | ICD-10-CM | POA: Diagnosis not present

## 2022-05-11 DIAGNOSIS — M109 Gout, unspecified: Secondary | ICD-10-CM | POA: Diagnosis present

## 2022-05-11 DIAGNOSIS — Z8249 Family history of ischemic heart disease and other diseases of the circulatory system: Secondary | ICD-10-CM

## 2022-05-11 DIAGNOSIS — I2510 Atherosclerotic heart disease of native coronary artery without angina pectoris: Secondary | ICD-10-CM | POA: Diagnosis present

## 2022-05-11 DIAGNOSIS — E785 Hyperlipidemia, unspecified: Secondary | ICD-10-CM | POA: Diagnosis present

## 2022-05-11 DIAGNOSIS — Z96652 Presence of left artificial knee joint: Secondary | ICD-10-CM | POA: Diagnosis present

## 2022-05-11 DIAGNOSIS — G4733 Obstructive sleep apnea (adult) (pediatric): Secondary | ICD-10-CM | POA: Diagnosis present

## 2022-05-11 DIAGNOSIS — Z7984 Long term (current) use of oral hypoglycemic drugs: Secondary | ICD-10-CM

## 2022-05-11 DIAGNOSIS — Z888 Allergy status to other drugs, medicaments and biological substances status: Secondary | ICD-10-CM | POA: Diagnosis not present

## 2022-05-11 DIAGNOSIS — Z8551 Personal history of malignant neoplasm of bladder: Secondary | ICD-10-CM | POA: Diagnosis not present

## 2022-05-11 DIAGNOSIS — Z8546 Personal history of malignant neoplasm of prostate: Secondary | ICD-10-CM

## 2022-05-11 DIAGNOSIS — Z79899 Other long term (current) drug therapy: Secondary | ICD-10-CM

## 2022-05-11 DIAGNOSIS — N400 Enlarged prostate without lower urinary tract symptoms: Secondary | ICD-10-CM | POA: Diagnosis present

## 2022-05-11 DIAGNOSIS — E1151 Type 2 diabetes mellitus with diabetic peripheral angiopathy without gangrene: Secondary | ICD-10-CM | POA: Diagnosis present

## 2022-05-11 HISTORY — PX: TRANSCAROTID ARTERY REVASCULARIZATIONÂ: SHX6778

## 2022-05-11 LAB — POCT ACTIVATED CLOTTING TIME: Activated Clotting Time: 311 seconds

## 2022-05-11 LAB — GLUCOSE, CAPILLARY
Glucose-Capillary: 128 mg/dL — ABNORMAL HIGH (ref 70–99)
Glucose-Capillary: 123 mg/dL — ABNORMAL HIGH (ref 70–99)
Glucose-Capillary: 136 mg/dL — ABNORMAL HIGH (ref 70–99)
Glucose-Capillary: 143 mg/dL — ABNORMAL HIGH (ref 70–99)

## 2022-05-11 SURGERY — TRANSCAROTID ARTERY REVASCULARIZATION (TCAR)
Anesthesia: General | Site: Neck | Laterality: Right

## 2022-05-11 MED ORDER — CEFAZOLIN SODIUM-DEXTROSE 2-4 GM/100ML-% IV SOLN
2.0000 g | Freq: Three times a day (TID) | INTRAVENOUS | Status: AC
  Administered 2022-05-11 (×2): 2 g via INTRAVENOUS
  Filled 2022-05-11 (×2): qty 100

## 2022-05-11 MED ORDER — ASPIRIN 81 MG PO TBEC: 81.0000 mg | DELAYED_RELEASE_TABLET | Freq: Every day | ORAL | Status: DC

## 2022-05-11 MED ORDER — FENTANYL CITRATE (PF) 100 MCG/2ML IJ SOLN
INTRAMUSCULAR | Status: AC
  Filled 2022-05-11: qty 2

## 2022-05-11 MED ORDER — CEFAZOLIN SODIUM-DEXTROSE 2-4 GM/100ML-% IV SOLN
2.0000 g | INTRAVENOUS | Status: AC
  Administered 2022-05-11: 2 g via INTRAVENOUS
  Filled 2022-05-11: qty 100

## 2022-05-11 MED ORDER — FENTANYL CITRATE (PF) 250 MCG/5ML IJ SOLN
INTRAMUSCULAR | Status: AC
  Filled 2022-05-11: qty 5

## 2022-05-11 MED ORDER — EPHEDRINE SULFATE-NACL 50-0.9 MG/10ML-% IV SOSY
PREFILLED_SYRINGE | INTRAVENOUS | Status: DC | PRN
  Administered 2022-05-11: 10 mg via INTRAVENOUS
  Administered 2022-05-11: 25 mg via INTRAVENOUS
  Administered 2022-05-11: 15 mg via INTRAVENOUS
  Administered 2022-05-11: 25 mg via INTRAVENOUS

## 2022-05-11 MED ORDER — LABETALOL HCL 5 MG/ML IV SOLN: 10.0000 mg | INTRAVENOUS | Status: DC | PRN

## 2022-05-11 MED ORDER — PHENOL 1.4 % MT LIQD: 1.0000 | OROMUCOSAL | Status: DC | PRN

## 2022-05-11 MED ORDER — HEMOSTATIC AGENTS (NO CHARGE) OPTIME
TOPICAL | Status: DC | PRN
  Administered 2022-05-11: 1 via TOPICAL

## 2022-05-11 MED ORDER — TAMSULOSIN HCL 0.4 MG PO CAPS
0.4000 mg | ORAL_CAPSULE | Freq: Every day | ORAL | Status: DC
  Administered 2022-05-12: 0.4 mg via ORAL
  Filled 2022-05-11: qty 1

## 2022-05-11 MED ORDER — ONDANSETRON HCL 4 MG/2ML IJ SOLN
INTRAMUSCULAR | Status: AC
  Filled 2022-05-11: qty 2

## 2022-05-11 MED ORDER — LABETALOL HCL 200 MG PO TABS
300.0000 mg | ORAL_TABLET | Freq: Two times a day (BID) | ORAL | Status: DC
  Administered 2022-05-12: 300 mg via ORAL
  Filled 2022-05-11: qty 2

## 2022-05-11 MED ORDER — ATROPINE SULFATE 0.4 MG/ML IV SOLN
INTRAVENOUS | Status: DC | PRN
  Administered 2022-05-11: .4 mg via INTRAVENOUS

## 2022-05-11 MED ORDER — INSULIN ASPART 100 UNIT/ML IJ SOLN
0.0000 [IU] | Freq: Three times a day (TID) | INTRAMUSCULAR | Status: DC
  Administered 2022-05-11 – 2022-05-12 (×2): 2 [IU] via SUBCUTANEOUS

## 2022-05-11 MED ORDER — VASOPRESSIN 20 UNIT/ML IV SOLN
INTRAVENOUS | Status: AC
  Filled 2022-05-11: qty 1

## 2022-05-11 MED ORDER — FENTANYL CITRATE (PF) 250 MCG/5ML IJ SOLN
INTRAMUSCULAR | Status: DC | PRN
  Administered 2022-05-11: 100 ug via INTRAVENOUS

## 2022-05-11 MED ORDER — POTASSIUM CHLORIDE CRYS ER 20 MEQ PO TBCR: 20.0000 meq | EXTENDED_RELEASE_TABLET | Freq: Every day | ORAL | Status: DC | PRN

## 2022-05-11 MED ORDER — PHENYLEPHRINE 80 MCG/ML (10ML) SYRINGE FOR IV PUSH (FOR BLOOD PRESSURE SUPPORT)
PREFILLED_SYRINGE | INTRAVENOUS | Status: DC | PRN
  Administered 2022-05-11: 160 ug via INTRAVENOUS
  Administered 2022-05-11: 240 ug via INTRAVENOUS
  Administered 2022-05-11: 160 ug via INTRAVENOUS

## 2022-05-11 MED ORDER — FENTANYL CITRATE (PF) 100 MCG/2ML IJ SOLN
25.0000 ug | INTRAMUSCULAR | Status: DC | PRN
  Administered 2022-05-11: 25 ug via INTRAVENOUS

## 2022-05-11 MED ORDER — OXYCODONE HCL 5 MG PO TABS
5.0000 mg | ORAL_TABLET | Freq: Once | ORAL | Status: AC | PRN
  Administered 2022-05-11: 5 mg via ORAL

## 2022-05-11 MED ORDER — IODIXANOL 320 MG/ML IV SOLN
INTRAVENOUS | Status: DC | PRN
  Administered 2022-05-11: 21 mL

## 2022-05-11 MED ORDER — SUGAMMADEX SODIUM 200 MG/2ML IV SOLN
INTRAVENOUS | Status: DC | PRN
  Administered 2022-05-11: 171.4 mg via INTRAVENOUS

## 2022-05-11 MED ORDER — PROTAMINE SULFATE 10 MG/ML IV SOLN
INTRAVENOUS | Status: DC | PRN
  Administered 2022-05-11: 50 mg via INTRAVENOUS

## 2022-05-11 MED ORDER — ATROPINE SULFATE 0.4 MG/ML IV SOLN
INTRAVENOUS | Status: AC
  Filled 2022-05-11: qty 2

## 2022-05-11 MED ORDER — EPHEDRINE 5 MG/ML INJ
INTRAVENOUS | Status: AC
  Filled 2022-05-11: qty 10

## 2022-05-11 MED ORDER — SODIUM CHLORIDE 0.9 % IV SOLN: 500.0000 mL | Freq: Once | INTRAVENOUS | Status: DC | PRN

## 2022-05-11 MED ORDER — LACTATED RINGERS IV SOLN: INTRAVENOUS | Status: DC

## 2022-05-11 MED ORDER — DEXAMETHASONE SODIUM PHOSPHATE 10 MG/ML IJ SOLN
INTRAMUSCULAR | Status: DC | PRN
  Administered 2022-05-11: 10 mg via INTRAVENOUS

## 2022-05-11 MED ORDER — GABAPENTIN 300 MG PO CAPS
300.0000 mg | ORAL_CAPSULE | Freq: Three times a day (TID) | ORAL | Status: DC
  Administered 2022-05-11 – 2022-05-12 (×3): 300 mg via ORAL
  Filled 2022-05-11 (×3): qty 1

## 2022-05-11 MED ORDER — SODIUM CHLORIDE 0.9 % IV SOLN: INTRAVENOUS | Status: DC

## 2022-05-11 MED ORDER — ALUM & MAG HYDROXIDE-SIMETH 200-200-20 MG/5ML PO SUSP: 15.0000 mL | ORAL | Status: DC | PRN

## 2022-05-11 MED ORDER — DOCUSATE SODIUM 100 MG PO CAPS
100.0000 mg | ORAL_CAPSULE | Freq: Every day | ORAL | Status: DC
  Filled 2022-05-11: qty 1

## 2022-05-11 MED ORDER — OXYCODONE-ACETAMINOPHEN 5-325 MG PO TABS
1.0000 | ORAL_TABLET | ORAL | Status: DC | PRN
  Administered 2022-05-11 – 2022-05-12 (×4): 1 via ORAL
  Filled 2022-05-11 (×4): qty 1

## 2022-05-11 MED ORDER — CHLORHEXIDINE GLUCONATE 0.12 % MT SOLN
15.0000 mL | Freq: Once | OROMUCOSAL | Status: AC
  Administered 2022-05-11: 15 mL via OROMUCOSAL
  Filled 2022-05-11: qty 15

## 2022-05-11 MED ORDER — CLOPIDOGREL BISULFATE 75 MG PO TABS
75.0000 mg | ORAL_TABLET | Freq: Every day | ORAL | Status: DC
  Administered 2022-05-12: 75 mg via ORAL
  Filled 2022-05-11: qty 1

## 2022-05-11 MED ORDER — GLYCOPYRROLATE PF 0.2 MG/ML IJ SOSY
PREFILLED_SYRINGE | INTRAMUSCULAR | Status: DC | PRN
  Administered 2022-05-11 (×2): .2 mg via INTRAVENOUS

## 2022-05-11 MED ORDER — PHENYLEPHRINE HCL-NACL 20-0.9 MG/250ML-% IV SOLN
INTRAVENOUS | Status: DC | PRN
  Administered 2022-05-11: 50 ug/min via INTRAVENOUS

## 2022-05-11 MED ORDER — 0.9 % SODIUM CHLORIDE (POUR BTL) OPTIME
TOPICAL | Status: DC | PRN
  Administered 2022-05-11: 1000 mL

## 2022-05-11 MED ORDER — LACTATED RINGERS IV SOLN: INTRAVENOUS | Status: DC | PRN

## 2022-05-11 MED ORDER — LIDOCAINE HCL (PF) 1 % IJ SOLN
INTRAMUSCULAR | Status: AC
Start: 2022-05-11 — End: ?
  Filled 2022-05-11: qty 30

## 2022-05-11 MED ORDER — ACETAMINOPHEN 500 MG PO TABS
1000.0000 mg | ORAL_TABLET | Freq: Once | ORAL | Status: AC
  Administered 2022-05-11: 1000 mg via ORAL
  Filled 2022-05-11: qty 2

## 2022-05-11 MED ORDER — PHENYLEPHRINE 80 MCG/ML (10ML) SYRINGE FOR IV PUSH (FOR BLOOD PRESSURE SUPPORT)
PREFILLED_SYRINGE | INTRAVENOUS | Status: AC
  Filled 2022-05-11: qty 10

## 2022-05-11 MED ORDER — HYDRALAZINE HCL 20 MG/ML IJ SOLN: 5.0000 mg | INTRAMUSCULAR | Status: DC | PRN

## 2022-05-11 MED ORDER — OXYCODONE HCL 5 MG PO TABS
ORAL_TABLET | ORAL | Status: AC
  Filled 2022-05-11: qty 1

## 2022-05-11 MED ORDER — INSULIN ASPART 100 UNIT/ML IJ SOLN: 0.0000 [IU] | INTRAMUSCULAR | Status: DC | PRN

## 2022-05-11 MED ORDER — ONDANSETRON HCL 4 MG/2ML IJ SOLN
INTRAMUSCULAR | Status: DC | PRN
  Administered 2022-05-11: 4 mg via INTRAVENOUS

## 2022-05-11 MED ORDER — CHLORHEXIDINE GLUCONATE CLOTH 2 % EX PADS: 6.0000 | MEDICATED_PAD | Freq: Once | CUTANEOUS | Status: DC

## 2022-05-11 MED ORDER — PROPOFOL 10 MG/ML IV BOLUS
INTRAVENOUS | Status: DC | PRN
  Administered 2022-05-11: 170 mg via INTRAVENOUS

## 2022-05-11 MED ORDER — ROCURONIUM BROMIDE 10 MG/ML (PF) SYRINGE
PREFILLED_SYRINGE | INTRAVENOUS | Status: AC
  Filled 2022-05-11: qty 10

## 2022-05-11 MED ORDER — ORAL CARE MOUTH RINSE: 15.0000 mL | Freq: Once | OROMUCOSAL | Status: AC

## 2022-05-11 MED ORDER — MORPHINE SULFATE (PF) 2 MG/ML IV SOLN: 2.0000 mg | INTRAVENOUS | Status: DC | PRN

## 2022-05-11 MED ORDER — LOSARTAN POTASSIUM 50 MG PO TABS
100.0000 mg | ORAL_TABLET | Freq: Every evening | ORAL | Status: DC
  Filled 2022-05-11: qty 2

## 2022-05-11 MED ORDER — OXYCODONE-ACETAMINOPHEN 5-325 MG PO TABS
ORAL_TABLET | ORAL | Status: AC
  Filled 2022-05-11: qty 1

## 2022-05-11 MED ORDER — HEPARIN SODIUM (PORCINE) 1000 UNIT/ML IJ SOLN
INTRAMUSCULAR | Status: DC | PRN
  Administered 2022-05-11: 10000 [IU] via INTRAVENOUS

## 2022-05-11 MED ORDER — AMLODIPINE BESYLATE 10 MG PO TABS
10.0000 mg | ORAL_TABLET | Freq: Every day | ORAL | Status: DC
  Administered 2022-05-12: 10 mg via ORAL
  Filled 2022-05-11: qty 1

## 2022-05-11 MED ORDER — ACETAMINOPHEN 325 MG PO TABS: 325.0000 mg | ORAL_TABLET | ORAL | Status: DC | PRN

## 2022-05-11 MED ORDER — GUAIFENESIN-DM 100-10 MG/5ML PO SYRP: 15.0000 mL | ORAL_SOLUTION | ORAL | Status: DC | PRN

## 2022-05-11 MED ORDER — MAGNESIUM SULFATE 2 GM/50ML IV SOLN: 2.0000 g | Freq: Every day | INTRAVENOUS | Status: DC | PRN

## 2022-05-11 MED ORDER — ROSUVASTATIN CALCIUM 5 MG PO TABS
10.0000 mg | ORAL_TABLET | Freq: Every morning | ORAL | Status: DC
  Administered 2022-05-12: 10 mg via ORAL
  Filled 2022-05-11: qty 2

## 2022-05-11 MED ORDER — HEPARIN 6000 UNIT IRRIGATION SOLUTION
Status: AC
  Filled 2022-05-11: qty 500

## 2022-05-11 MED ORDER — FINASTERIDE 5 MG PO TABS
5.0000 mg | ORAL_TABLET | Freq: Every day | ORAL | Status: DC
  Administered 2022-05-11: 5 mg via ORAL
  Filled 2022-05-11: qty 1

## 2022-05-11 MED ORDER — STERILE WATER FOR IRRIGATION IR SOLN
Status: DC | PRN
  Administered 2022-05-11: 1000 mL

## 2022-05-11 MED ORDER — LIDOCAINE 2% (20 MG/ML) 5 ML SYRINGE
INTRAMUSCULAR | Status: DC | PRN
  Administered 2022-05-11: 100 mg via INTRAVENOUS

## 2022-05-11 MED ORDER — HEPARIN 6000 UNIT IRRIGATION SOLUTION
Status: DC | PRN
  Administered 2022-05-11: 1

## 2022-05-11 MED ORDER — ONDANSETRON HCL 4 MG/2ML IJ SOLN
4.0000 mg | Freq: Four times a day (QID) | INTRAMUSCULAR | Status: DC | PRN
  Administered 2022-05-11 (×2): 4 mg via INTRAVENOUS
  Filled 2022-05-11 (×2): qty 2

## 2022-05-11 MED ORDER — PROPOFOL 500 MG/50ML IV EMUL
INTRAVENOUS | Status: DC | PRN
  Administered 2022-05-11: 50 ug/kg/min via INTRAVENOUS

## 2022-05-11 MED ORDER — METOPROLOL TARTRATE 5 MG/5ML IV SOLN: 2.0000 mg | INTRAVENOUS | Status: DC | PRN

## 2022-05-11 MED ORDER — PANTOPRAZOLE SODIUM 40 MG PO TBEC
40.0000 mg | DELAYED_RELEASE_TABLET | Freq: Every day | ORAL | Status: DC
  Administered 2022-05-11 – 2022-05-12 (×2): 40 mg via ORAL
  Filled 2022-05-11 (×2): qty 1

## 2022-05-11 MED ORDER — LIDOCAINE 2% (20 MG/ML) 5 ML SYRINGE
INTRAMUSCULAR | Status: AC
  Filled 2022-05-11: qty 5

## 2022-05-11 MED ORDER — ROCURONIUM BROMIDE 10 MG/ML (PF) SYRINGE
PREFILLED_SYRINGE | INTRAVENOUS | Status: DC | PRN
  Administered 2022-05-11: 60 mg via INTRAVENOUS

## 2022-05-11 MED ORDER — DEXAMETHASONE SODIUM PHOSPHATE 10 MG/ML IJ SOLN
INTRAMUSCULAR | Status: AC
  Filled 2022-05-11: qty 1

## 2022-05-11 MED ORDER — VASOPRESSIN 20 UNIT/ML IV SOLN
INTRAVENOUS | Status: DC | PRN
  Administered 2022-05-11: 2 [IU] via INTRAVENOUS

## 2022-05-11 MED ORDER — ACETAMINOPHEN 650 MG RE SUPP: 325.0000 mg | RECTAL | Status: DC | PRN

## 2022-05-11 MED ORDER — OXYCODONE HCL 5 MG/5ML PO SOLN: 5.0000 mg | Freq: Once | ORAL | Status: AC | PRN

## 2022-05-11 SURGICAL SUPPLY — 61 items
BAG BANDED W/RUBBER/TAPE 36X54 (MISCELLANEOUS) ×1 IMPLANT
BAG COUNTER SPONGE SURGICOUNT (BAG) ×1 IMPLANT
BALLN STERLING RX 6X30X80 (BALLOONS) ×1
BALLOON STERLING RX 6X30X80 (BALLOONS) IMPLANT
CANISTER SUCT 3000ML PPV (MISCELLANEOUS) ×1 IMPLANT
CATH BEACON 5 .035 40 KMP TP (CATHETERS) IMPLANT
CATH BEACON 5 .038 40 KMP TP (CATHETERS) ×1
CATH ROBINSON RED A/P 18FR (CATHETERS) IMPLANT
CLIP LIGATING EXTRA MED SLVR (CLIP) ×1 IMPLANT
CLIP LIGATING EXTRA SM BLUE (MISCELLANEOUS) ×1 IMPLANT
COVER DOME SNAP 22 D (MISCELLANEOUS) ×1 IMPLANT
COVER PROBE W GEL 5X96 (DRAPES) ×1 IMPLANT
DERMABOND ADVANCED (GAUZE/BANDAGES/DRESSINGS) ×2
DERMABOND ADVANCED .7 DNX12 (GAUZE/BANDAGES/DRESSINGS) ×1 IMPLANT
DRAPE FEMORAL ANGIO 80X135IN (DRAPES) ×1 IMPLANT
ELECT REM PT RETURN 9FT ADLT (ELECTROSURGICAL) ×1
ELECTRODE REM PT RTRN 9FT ADLT (ELECTROSURGICAL) ×1 IMPLANT
GLOVE BIO SURGEON STRL SZ7.5 (GLOVE) ×1 IMPLANT
GOWN STRL REUS W/ TWL LRG LVL3 (GOWN DISPOSABLE) ×2 IMPLANT
GOWN STRL REUS W/ TWL XL LVL3 (GOWN DISPOSABLE) ×1 IMPLANT
GOWN STRL REUS W/TWL LRG LVL3 (GOWN DISPOSABLE) ×2
GOWN STRL REUS W/TWL XL LVL3 (GOWN DISPOSABLE) ×1
GUIDEWIRE ENROUTE 0.014 (WIRE) ×1 IMPLANT
HEMOSTAT SNOW SURGICEL 2X4 (HEMOSTASIS) IMPLANT
INSERT FOGARTY SM (MISCELLANEOUS) IMPLANT
INTRODUCER KIT GALT 7CM (INTRODUCER) ×1
KIT BASIN OR (CUSTOM PROCEDURE TRAY) ×1 IMPLANT
KIT ENCORE 26 ADVANTAGE (KITS) ×1 IMPLANT
KIT INTRODUCER GALT 7 (INTRODUCER) ×1 IMPLANT
KIT TURNOVER KIT B (KITS) ×1 IMPLANT
NDL HYPO 25GX1X1/2 BEV (NEEDLE) IMPLANT
NEEDLE HYPO 25GX1X1/2 BEV (NEEDLE) IMPLANT
PACK CAROTID (CUSTOM PROCEDURE TRAY) ×1 IMPLANT
POSITIONER HEAD DONUT 9IN (MISCELLANEOUS) ×1 IMPLANT
PROTECTION STATION PRESSURIZED (MISCELLANEOUS)
SET MICROPUNCTURE 5F STIFF (MISCELLANEOUS) IMPLANT
SHEATH AVANTI 11CM 5FR (SHEATH) IMPLANT
SHUNT CAROTID BYPASS 10 (VASCULAR PRODUCTS) IMPLANT
SHUNT CAROTID BYPASS 12FRX15.5 (VASCULAR PRODUCTS) IMPLANT
STATION PROTECTION PRESSURIZED (MISCELLANEOUS) IMPLANT
STENT TRANSCAROTID SYS 10X40 (Permanent Stent) IMPLANT
SUT MNCRL AB 4-0 PS2 18 (SUTURE) ×1 IMPLANT
SUT PROLENE 5 0 C 1 24 (SUTURE) ×1 IMPLANT
SUT PROLENE 6 0 BV (SUTURE) IMPLANT
SUT PROLENE 7 0 BV 1 (SUTURE) IMPLANT
SUT SILK 2 0 PERMA HAND 18 BK (SUTURE) ×1 IMPLANT
SUT SILK 2 0 SH CR/8 (SUTURE) ×1 IMPLANT
SUT SILK 3 0 (SUTURE)
SUT SILK 3-0 18XBRD TIE 12 (SUTURE) IMPLANT
SUT VIC AB 3-0 SH 27 (SUTURE) ×1
SUT VIC AB 3-0 SH 27X BRD (SUTURE) ×1 IMPLANT
SYR 10ML LL (SYRINGE) ×3 IMPLANT
SYR 20ML LL LF (SYRINGE) ×1 IMPLANT
SYR CONTROL 10ML LL (SYRINGE) IMPLANT
SYSTEM TRANSCAROTID NEUROPRTCT (MISCELLANEOUS) ×1 IMPLANT
TOWEL GREEN STERILE (TOWEL DISPOSABLE) ×1 IMPLANT
TRANSCAROTID NEUROPROTECT SYS (MISCELLANEOUS) ×1
TUBING ART PRESS 48 MALE/FEM (TUBING) IMPLANT
TUBING EXTENTION W/L.L. (IV SETS) IMPLANT
WATER STERILE IRR 1000ML POUR (IV SOLUTION) ×1 IMPLANT
WIRE BENTSON .035X145CM (WIRE) ×1 IMPLANT

## 2022-05-11 NOTE — Anesthesia Procedure Notes (Signed)
Arterial Line Insertion Start/End8/25/2023 7:00 AM, 05/11/2022 7:10 AM Performed by: Brennan Bailey, MD, Minerva Ends, CRNA, CRNA  Patient location: Pre-op. Preanesthetic checklist: patient identified, IV checked, site marked, risks and benefits discussed, surgical consent, monitors and equipment checked, pre-op evaluation, timeout performed and anesthesia consent Lidocaine 1% used for infiltration Right, radial was placed Catheter size: 20 G Hand hygiene performed  and maximum sterile barriers used   Attempts: 1 Procedure performed without using ultrasound guided technique. Following insertion, dressing applied. Post procedure assessment: normal and unchanged  Patient tolerated the procedure well with no immediate complications.

## 2022-05-11 NOTE — Anesthesia Procedure Notes (Signed)
Procedure Name: Intubation Date/Time: 05/11/2022 7:40 AM  Performed by: Minerva Ends, CRNAPre-anesthesia Checklist: Patient identified, Emergency Drugs available, Suction available and Patient being monitored Patient Re-evaluated:Patient Re-evaluated prior to induction Oxygen Delivery Method: Circle system utilized Preoxygenation: Pre-oxygenation with 100% oxygen Induction Type: IV induction Ventilation: Mask ventilation without difficulty Laryngoscope Size: Mac and 3 Grade View: Grade I Tube type: Oral Tube size: 7.0 mm Number of attempts: 1 Airway Equipment and Method: Stylet and Oral airway Placement Confirmation: ETT inserted through vocal cords under direct vision, positive ETCO2 and breath sounds checked- equal and bilateral Secured at: 23 cm Tube secured with: Tape Dental Injury: Teeth and Oropharynx as per pre-operative assessment

## 2022-05-11 NOTE — Anesthesia Postprocedure Evaluation (Signed)
Anesthesia Post Note  Patient: [name]  Procedure(s) Performed: Right Transcarotid Artery Revascularization (Right: Neck)     Patient location during evaluation: PACU Anesthesia Type: General Level of consciousness: awake and alert Pain management: pain level controlled Vital Signs Assessment: post-procedure vital signs reviewed and stable Respiratory status: spontaneous breathing, nonlabored ventilation and respiratory function stable Cardiovascular status: blood pressure returned to baseline Postop Assessment: no apparent nausea or vomiting Anesthetic complications: no   No notable events documented.  Last Vitals:  Vitals:   05/11/22 0945 05/11/22 1000  BP: 129/61 134/65  Pulse: 75 72  Resp: 15 16  Temp:    SpO2: 92% 92%    Last Pain:  Vitals:   05/11/22 1000  TempSrc:   PainSc: [place]

## 2022-05-11 NOTE — H&P (Signed)
HPI [name] is a 76 y.o. male with a known history of carotid artery disease initially evaluated by Dr. Amedeo Plenty more recently by Dr. Donnetta Hutching.  He has extensive history of cervical disc fusion and has been considered not a candidate for carotid surgery.  He denies any history of stroke, TIA or amaurosis.  He was recently undergone duplex and subsequent CT scan which demonstrated high-grade stenosis bilaterally in his carotid arteries.  He remains on aspirin, Plavix and a statin.       Past Medical History:  Diagnosis Date   Bilateral renal cysts     Bladder cancer ([place]) UROLOGIST-  DR [facility]    RECURRENT 11/ 2017 --  hx turbt in 2004 and 08-19-2013   BPH without obstruction/lower urinary tract symptoms     Cervical fusion syndrome      LEFT ARM NUMBNESS--  CONTROLLED W/ GABAPENTIN   Coronary artery disease      CARDIOLOGIST-- DR Harl Bowie [name])   DDD (degenerative disc disease), cervical     DDD (degenerative disc disease), lumbar     Diverticulosis of sigmoid colon     History of acute gouty arthritis     History of adenomatous polyp of colon      2001;  2012- non-malignant leiomyoma;  04-07-2014  tubualr adenoma and hyperplastic polyp's   History of prostate cancer UROLOGIST-  DR [facility]    S/P  RADIOACTIVE SEED IMPLANTS 2004   HOH (hard of hearing)      no hearing aids   Hyperlipidemia     Hypertension     OA (osteoarthritis)     Occlusion and stenosis of carotid artery without mention of cerebral infarction CARDIOLOGIST  -- DR  [name]    LAST DUPLEX  11-23-2015  RICA  (DUPLEX DOPPLER 01-20-2016 RICA 31-54% PROXIMAL) /   LICA <00% (HAD CONSULT W/ [name] , VASCULAR SURGEON 01-20-2016)   OSA on CPAP      BiPAP   Spondylosis, cervical     Type 2 diabetes mellitus ([place])     Wears glasses           Family History  Problem Relation Age of Onset   Breast cancer Mother          mets   Diabetes Mother     Heart disease Father          MI   Colon cancer  Brother     Cancer Sister          male organs   Diabetes Sister          family hx   Arthritis Other          entire family   Diabetes Brother          x 2         Past Surgical History:  Procedure Laterality Date   ANTERIOR REMOVAL CAGE AND PLATE C3-C7/ CORPECTOMY C7 (FX)/  ALLOGRAFT AND FUSION C3 -- T1/  POSTERIOR DECOMPRESSION BILATERAL LAMINECTOMY C4 -- 6 & PARTIAL C3/  POSTEROLATERAL ARTHRODESIS C3 - T1   06/05/2005   APPENDECTOMY   1990'S   CATARACT EXTRACTION W/ INTRAOCULAR LENS  IMPLANT, BILATERAL   2013   CERVICAL FUSION   05/11/2005    C3  --  C7/  due to post op quadriparesis same day s/p  anterior C 4,5,6, colpectomy, decompression, removal epidural hematoma, foraminnotomy, cage and plate   COLONOSCOPY   last one 04-07-2014  CYSTOSCOPY W/ RETROGRADES Bilateral 08/19/2013    Procedure: CYSTOSCOPY WITH RETROGRADE PYELOGRAM;  Surgeon: Molli Hazard, MD;  Location: [facility];  Service: Urology;  Laterality: Bilateral;   CYSTOSCOPY W/ RETROGRADES Bilateral 07/18/2016    Procedure: CYSTOSCOPY WITH RETROGRADE PYELOGRAM;  Surgeon: Alexis Frock, MD;  Location: [facility];  Service: Urology;  Laterality: Bilateral;   ENDOSCOPIC REPAIR CSF LEAK VIA NASAL PASSAGE   2011   INTRAOPERATIVE ARTERIOGRAM   CATH LAB  01-05-2009  DR Einar Gip    RICA   ACUTE ANGLE 80-85% STENOSIS   KNEE ARTHROSCOPY Left 07/15/2017    Procedure: ARTHROSCOPY LEFT  KNEE AND DEBRIDEMENT, medial meniscal tear and chondromalaita;  Surgeon: Paralee Cancel, MD;  Location: [facility];  Service: Orthopedics;  Laterality: Left;  [place]   03-26-2012    L3 --  L5   RADIOACTIVE PROSTATE SEED IMPLANTS   08-19-2003   SEPTOPLASTY   1998   TOTAL KNEE ARTHROPLASTY Left 02/25/2018    Procedure: LEFT TOTAL KNEE ARTHROPLASTY;  Surgeon: Paralee Cancel, MD;  Location: [facility];  Service: Orthopedics;  Laterality: Left;  70 mins   TRANSURETHRAL RESECTION OF BLADDER TUMOR N/A  08/19/2013    Procedure: TRANSURETHRAL RESECTION OF BLADDER TUMOR (TURBT);  Surgeon: Molli Hazard, MD;  Location: [facility];  Service: Urology;  Laterality: N/A;   TRANSURETHRAL RESECTION OF BLADDER TUMOR N/A 07/18/2016    Procedure: TRANSURETHRAL RESECTION OF BLADDER TUMOR (TURBT);  Surgeon: Alexis Frock, MD;  Location: [facility];  Service: Urology;  Laterality: N/A;   YAG LASER APPLICATION   09/62/8366    Procedure: YAG LASER APPLICATION;  Surgeon: Elta Guadeloupe T. Gershon Crane, MD;  Location: [facility];  Service: Ophthalmology;  Laterality: Right;      Short Social History:  Social History         Tobacco Use   Smoking status: Every Day      Packs/day: 1.00      Years: 47.00      Total pack years: 47.00      Types: Cigarettes   Smokeless tobacco: Never   Tobacco comments:      pt quits smoking periodically :: started smoking again Jan 2018 1ppd  Substance Use Topics   Alcohol use: No      Alcohol/week: 0.0 standard drinks of alcohol           Allergies  Allergen Reactions   Dilaudid [Hydromorphone Hcl] Other (See Comments)      "acts a little off"   Toradol [Ketorolac Tromethamine] Other (See Comments)      CauseD  hallucination ?-states not sure/ avoids per his MD   Lyrica [Pregabalin] Nausea Only and Other (See Comments)      Make me feel "bad"            Current Outpatient Medications  Medication Sig Dispense Refill   allopurinol (ZYLOPRIM) 300 MG tablet Take 300 mg by mouth at bedtime.       amLODipine (NORVASC) 10 MG tablet TAKE 1 TABLET BY MOUTH EVERY DAY 90 tablet 3   aspirin 81 MG EC tablet Take 81 mg by mouth at bedtime.       celecoxib (CELEBREX) 200 MG capsule Take 200 mg by mouth 2 (two) times daily.        clopidogrel (PLAVIX) 75 MG tablet Take 75 mg by mouth daily.    6   colchicine 0.6 MG tablet Take 0.6 mg by mouth  2 (two) times daily as needed (gout).        cyclobenzaprine (FLEXERIL) 10 MG tablet Take 10 mg by mouth 3  (three) times daily as needed.       docusate sodium (COLACE) 100 MG capsule Take 200 mg by mouth at bedtime.        ferrous sulfate (FERROUSUL) 325 (65 FE) MG tablet Take 1 tablet (325 mg total) by mouth 3 (three) times daily with meals.   3   finasteride (PROSCAR) 5 MG tablet Take 5 mg by mouth at bedtime.       furosemide (LASIX) 40 MG tablet Take 40 mg by mouth every morning.       gabapentin (NEURONTIN) 300 MG capsule Take 300 mg by mouth 3 (three) times daily.       HYDROcodone-acetaminophen (NORCO) 7.5-325 MG tablet Take 1-2 tablets by mouth every 4 (four) hours as needed for moderate pain. 60 tablet 0   labetalol (NORMODYNE) 300 MG tablet Take 300 mg by mouth 2 (two) times daily.       losartan (COZAAR) 100 MG tablet Take 1 tablet (100 mg total) by mouth every evening. 30 tablet 6   metFORMIN (GLUCOPHAGE-XR) 500 MG 24 hr tablet Take 2 tablets by mouth in the morning and at bedtime.       methocarbamol (ROBAXIN) 500 MG tablet Take 1 tablet (500 mg total) by mouth every 6 (six) hours as needed for muscle spasms. 40 tablet 0   Omega-3 Fatty Acids (FISH OIL) 1200 MG CAPS Take 1,200 mg by mouth 2 (two) times daily.       polyethylene glycol powder (GLYCOLAX/MIRALAX) powder TAKE '17MG'$ (1CAPFUL) BY MOUTH(MIXED WITH WATER/JUICE) DAILY 527 g 0   rosuvastatin (CRESTOR) 10 MG tablet Take 10 mg by mouth every morning.        tamsulosin (FLOMAX) 0.4 MG CAPS capsule Take 0.4 mg by mouth daily.       traMADol (ULTRAM) 50 MG tablet Take 50 mg by mouth every 6 (six) hours as needed for pain.        No current facility-administered medications for this visit.      Review of Systems  Constitutional:  Constitutional negative. HENT: HENT negative.  Eyes: Eyes negative.  Respiratory: Respiratory negative.  Cardiovascular: Positive for claudication.  GI: Gastrointestinal negative.  Musculoskeletal: Musculoskeletal negative.  Skin: Skin negative.  Neurological: Neurological negative. Hematologic:  Hematologic/lymphatic negative.  Psychiatric: Psychiatric negative.          Objective:    Vitals:   05/11/22 0540  BP: (!) 144/64  Pulse: 66  Resp: 17  Temp: 98.5 F (36.9 C)  SpO2: 96%      Physical Exam HENT:     Head: Normocephalic.     Nose: Nose normal.  Eyes:     Pupils: Pupils are equal, round, and reactive to light.  Cardiovascular:     Pulses:          Radial pulses are 2+ on the right side and 2+ on the left side.  Pulmonary:     Effort: Pulmonary effort is normal.  Abdominal:     General: Abdomen is flat.     Palpations: Abdomen is soft.  Musculoskeletal:     Cervical back: Neck supple.     Right lower leg: No edema.     Left lower leg: No edema.  Skin:    General: Skin is warm and dry.     Capillary Refill: Capillary refill takes less than 2  seconds.  Neurological:     General: No focal deficit present.     Mental Status: He is alert.        Data: CTA IMPRESSION: 1. Atherosclerotic calcification right carotid bifurcation. Approximately 80% diameter stenosis proximal right internal carotid artery so should with kinking of the arteries 2. Atherosclerotic calcification left carotid bifurcation. There is a focal soft tissue web in the proximal left internal carotid artery with severe stenosis, greater than 80% diameter stenosis 3. Both vertebral arteries widely patent.      Assessment/Plan:    76 year old male with high-grade bilateral carotid artery stenosis bilaterally. Plan for right TCAR today in OR. Again discussed risks, benefits and alternatives.      Waynetta Sandy MD [facility]

## 2022-05-11 NOTE — Transfer of Care (Signed)
Immediate Anesthesia Transfer of Care Note  Patient: [name]  Procedure(s) Performed: Right Transcarotid Artery Revascularization (Right: Neck)  Patient Location: PACU  Anesthesia Type:General  Level of Consciousness: awake and alert   Airway & Oxygen Therapy: Patient Spontanous Breathing and Patient connected to nasal cannula oxygen  Post-op Assessment: Report given to RN and Post -op Vital signs reviewed and stable  Post vital signs: Reviewed and stable  Last Vitals:  Vitals Value Taken Time  BP 148/55   Temp 36   Pulse 81   Resp 16   SpO2 96     Last Pain:  Vitals:   05/11/22 0552  TempSrc:   PainSc: 4          Complications: No notable events documented.

## 2022-05-11 NOTE — Op Note (Signed)
Patient name: [name] MRN: [id number] DOB: 24-Feb-1946 Sex: male  05/11/2022 Pre-operative Diagnosis: Asymptomatic right internal carotid artery stenosis Post-operative diagnosis:  Same Surgeon:  Erlene Quan C. Donzetta Matters, MD Assistant: Arlee Muslim, PA Procedure Performed: 1.  Ultrasound-guided cannulation left common femoral vein for placement of flow reversal venous sheath 2.  Transcatheter placement of right ICA transcarotid artery stent with 10 x 40 mm EnRoute stent with flow reversal neuro protection  Indications: 76 year old male with history of bilateral carotid stenosis with history of cervical neck fusion now with indication for treatment of his right internal carotid artery greater than 80% stenosis.  We reviewed his CT scan and he appears to be a candidate for transcarotid artery stenting and prohibitive for carotid endarterectomy given high risk due to cervical neck fusion.  Findings: There was extensive hardware in his neck which did limit the view from RAO projection but from an LAO projection we were able to see the carotid bifurcation.  We predilated with a 6 x 30 mm balloon and stented with a 10 x 40 mm stent and at completion the external carotid artery had occluded there was brisk flow through the common carotid and ICA and a strong signal with flow throughout diastole by Doppler.   Procedure:  The patient was identified in the holding area and taken to the room was placed supine operative table general anesthesia was induced.  He was sterilely prepped and draped in the neck and chest in usual fashion, antibiotics were minister timeout was called.  Ultrasound was used to first identify the right ICA and with a more lateral incision than usual that was vertically placed on his neck we were able to dissect down through the 2 heads of the sternocleidomastoid actually retracting some of this medially and then the IJ was retracted laterally along with the vagus and we identified the  common carotid artery patient was fully heparinized.  Concomitantly ultrasound was used to identify the left common femoral vein and this was cannulated by functional followed by wire and the sheath followed by a Bentson wire an 8 French flow reversal sheath and this was affixed to the skin with silk suture.  After the patient was fully heparinized we encircled the common carotid artery with Vesseloops and umbilical tape.  We then cannulated the common carotid artery after placing a U stitch with 5-0 Prolene and performed angiography in multiple views.  We then placed a J-wire to the carotid bifurcation and placed our flow reversal 8 French sheath into the common carotid artery and this was affixed to the skin in 2 separate places.  Patient was then connected to flow reversal and this was confirmed.  After ACT was confirmed over 300 we then proceeded with clamping the common carotid artery with our umbilical tape and confirming flow reversal and then crossing the bifurcation using the help of a Berenstein catheter and 014 wire this was noted to be high up into the cervical ICA.  We then predilated with a 6 x 30 balloon and primarily stented with a 10 x 40 stent.  We then allowed 2 minutes of washout and completion angiography in 2 views demonstrated no stenosis.  The wire was removed and clamp was taken off after 8 minutes of clamp time.  We then removed the sheath and cinched down 5-0 Prolene U stitch.  Doppler demonstrated expected flow throughout the common carotid artery with flow throughout diastole.  Satisfied with this we administered 50 mg of protamine and  removed the sheath from the left groin and pressure was held and hemostasis obtained.  Hemostasis was obtained in the neck we then closed in layers after washing out with saline we closed the platysma with Vicryl and the skin with 4-0 Vicryl.  Dermabond was placed at the skin level.  Patient was then awakened from anesthesia having tolerated procedure  without any complication.  All counts were correct at completion.  He was neurologically intact and transferred to recovery room in stable condition.   Given the complexity of the case,  the assistant was necessary in order to expedient the procedure and safely perform the technical aspects of the operation.  The assistant provided traction and countertraction to assist with exposure of the common carotid artery. they were necessary for placement of the sheath and for manipulation of the 3 wires.  These skills, especially following the Prolene suture for the anastomosis, could not have been adequately performed by a scrub tech assistant.    Contrast: 25 cc  EBL: 50 cc    Betha Shadix C. Donzetta Matters, MD [facility] Office: [phone] Pager: [phone]

## 2022-05-12 LAB — CBC
HCT: 32.7 % — ABNORMAL LOW (ref 39.0–52.0)
Hemoglobin: 11.1 g/dL — ABNORMAL LOW (ref 13.0–17.0)
MCH: 31 pg (ref 26.0–34.0)
MCHC: 33.9 g/dL (ref 30.0–36.0)
MCV: 91.3 fL (ref 80.0–100.0)
Platelets: 139 10*3/uL — ABNORMAL LOW (ref 150–400)
RBC: 3.58 MIL/uL — ABNORMAL LOW (ref 4.22–5.81)
RDW: 13.4 % (ref 11.5–15.5)
WBC: 13.9 10*3/uL — ABNORMAL HIGH (ref 4.0–10.5)
nRBC: 0 % (ref 0.0–0.2)

## 2022-05-12 LAB — BASIC METABOLIC PANEL
Anion gap: 7 (ref 5–15)
BUN: 25 mg/dL — ABNORMAL HIGH (ref 8–23)
CO2: 23 mmol/L (ref 22–32)
Calcium: 8.7 mg/dL — ABNORMAL LOW (ref 8.9–10.3)
Chloride: 108 mmol/L (ref 98–111)
Creatinine, Ser: 1.2 mg/dL (ref 0.61–1.24)
GFR, Estimated: >60 mL/min (ref >60)
Glucose, Bld: 130 mg/dL — ABNORMAL HIGH (ref 70–99)
Potassium: 4.2 mmol/L (ref 3.5–5.1)
Sodium: 138 mmol/L (ref 135–145)

## 2022-05-12 LAB — GLUCOSE, CAPILLARY: Glucose-Capillary: 131 mg/dL — ABNORMAL HIGH (ref 70–99)

## 2022-05-12 MED ORDER — OXYCODONE-ACETAMINOPHEN 5-325 MG PO TABS: 1.0000 | ORAL_TABLET | Freq: Four times a day (QID) | ORAL | 0 refills | Status: DC | PRN

## 2022-05-12 NOTE — Progress Notes (Addendum)
  Progress Note    05/12/2022 8:36 AM 1 Day Post-Op  Subjective:  ready for discharge home.  Denies one sided weakness, slurred speech, vision changes   Vitals:   05/12/22 0306 05/12/22 0800  BP: (!) 116/50 (!) 105/44  Pulse: (!) 59 (!) 55  Resp: 18 16  Temp: 98.4 F (36.9 C) 98 F (36.7 C)  SpO2: 91% 92%   Physical Exam Lungs:  non labored Incisions:  R neck incision c/d/I; L groin cath site without hematoma Extremities:  moving all extremities well Neurologic: CN grossly intact  CBC    Component Value Date/Time   WBC 13.9 (H) 05/12/2022 0305   RBC 3.58 (L) 05/12/2022 0305   HGB 11.1 (L) 05/12/2022 0305   HCT 32.7 (L) 05/12/2022 0305   PLT 139 (L) 05/12/2022 0305   MCV 91.3 05/12/2022 0305   MCH 31.0 05/12/2022 0305   MCHC 33.9 05/12/2022 0305   RDW 13.4 05/12/2022 0305   LYMPHSABS 0.9 02/03/2017 1018   MONOABS 0.9 02/03/2017 1018   EOSABS 0.1 02/03/2017 1018   BASOSABS 0.1 02/03/2017 1018    BMET    Component Value Date/Time   NA 138 05/12/2022 0305   K 4.2 05/12/2022 0305   CL 108 05/12/2022 0305   CO2 23 05/12/2022 0305   GLUCOSE 130 (H) 05/12/2022 0305   BUN 25 (H) 05/12/2022 0305   CREATININE 1.20 05/12/2022 0305   CALCIUM 8.7 (L) 05/12/2022 0305   GFRNONAA >60 05/12/2022 0305   GFRAA >60 02/26/2018 0551    INR    Component Value Date/Time   INR 1.1 05/07/2022 1014     Intake/Output Summary (Last 24 hours) at 05/12/2022 0836 Last data filed at 05/12/2022 0801 Gross per 24 hour  Intake 3109.94 ml  Output 785 ml  Net 2324.94 ml     Assessment/Plan:  76 y.o. male is s/p R TCAR 1 Day Post-Op   Neuro exam at baseline Neck incision without hematoma Ok for discharge home.  Office will arrange carotid duplex in 1 month   Dagoberto Ligas, [place] [facility] [phone] 05/12/2022 8:36 AM   I agree with the above.  I have seen and evaluated the patient.  He is postoperative day #1 from a right sided TCAR.  His incision  is clean and dry without hematoma.  He is neurologically intact.  He is stable for discharge  Annamarie Major

## 2022-05-12 NOTE — Discharge Summary (Signed)
Discharge Summary     [name] [birth year] 76 y.o. male  [id number]  Admission Date: 05/11/2022  Discharge Date: 05/12/22  Physician: Dr. Donzetta Matters  Admission Diagnosis: Carotid stenosis [I65.29] Carotid artery stenosis [I65.29]  Discharge Day services:    See progress note 05/12/22  Hospital Course:  [name]   Recent Labs    05/12/22 0305  NA 138  K 4.2  CL 108  CO2 23  GLUCOSE 130*  BUN 25*  CALCIUM 8.7*   Recent Labs    05/12/22 0305  WBC 13.9*  HGB 11.1*  HCT 32.7*  PLT 139*   No results for input(s): "INR" in the last 72 hours.     Discharge Diagnosis:  Carotid stenosis [I65.29] Carotid artery stenosis [I65.29]  Secondary Diagnosis: Patient Active Problem List   Diagnosis Date Noted   Carotid stenosis 05/11/2022   Carotid artery stenosis 05/11/2022   Obese 02/28/2018   S/P left TKA 02/25/2018   S/P total knee replacement 02/25/2018   Fever 10/09/2016   Community acquired pneumonia of left lower lobe of lung 10/09/2016   Gram negative sepsis ([place]) 02/05/2014   UTI (urinary tract infection) 02/04/2014   Sepsis ([place]) 02/04/2014   DM type 2 (diabetes mellitus, type 2) ([place]) 02/04/2014   Tobacco abuse 02/04/2014   Preop cardiovascular exam 08/05/2013   CAD (coronary artery disease) 08/05/2012   Degenerative lumbar spinal stenosis 04/03/2012   Chest pain 04/03/2012   Anemia 04/03/2012   Smoking 06/13/2011   JOINT EFFUSION, KNEE 01/11/2010   ARTHRITIS, RIGHT KNEE 12/12/2009   PAIN IN JOINT, LOWER LEG 12/12/2009   PROSTATE CANCER 11/08/2009   GOUTY ARTHRITIS, CHRONIC 11/08/2009   Overweight 11/08/2009   HYPERTENSION, BENIGN 11/08/2009   Carotid artery disease without cerebral infarction ([place]) 11/08/2009   Malignant neoplasm of bladder ([place]) 03/22/2008   Hyperlipidemia 03/22/2008   PAD (peripheral artery disease) ([place]) 03/22/2008   CONSTIPATION, CHRONIC 03/22/2008   DEGENERATIVE JOINT DISEASE 03/22/2008   OSA (obstructive  sleep apnea) 03/22/2008   PROSTATE CANCER, HX OF 03/22/2008   DIVERTICULITIS, HX OF 03/22/2008   Past Medical History:  Diagnosis Date   Bilateral renal cysts    Bladder cancer ([place]) UROLOGIST-  DR [facility]   RECURRENT 11/ 2017 --  hx turbt in 2004 and 08-19-2013   BPH without obstruction/lower urinary tract symptoms    Cervical fusion syndrome    LEFT ARM NUMBNESS--  CONTROLLED W/ GABAPENTIN   Coronary artery disease    CARDIOLOGIST-- DR Harl Bowie [name])   DDD (degenerative disc disease), cervical    DDD (degenerative disc disease), lumbar    Diverticulosis of sigmoid colon    Heart murmur    slight murmur per patient   History of acute gouty arthritis    History of adenomatous polyp of colon    2001;  2012- non-malignant leiomyoma;  04-07-2014  tubualr adenoma and hyperplastic polyp's   History of prostate cancer UROLOGIST-  DR [facility]   S/P  RADIOACTIVE SEED IMPLANTS 2004   HOH (hard of hearing)    no hearing aids   Hyperlipidemia    Hypertension    OA (osteoarthritis)    Occlusion and stenosis of carotid artery without mention of cerebral infarction CARDIOLOGIST  -- DR  [name]   LAST DUPLEX  11-23-2015  RICA  (DUPLEX DOPPLER 01-20-2016 RICA 96-78% PROXIMAL) /   LICA <93% (HAD CONSULT W/ [name] , VASCULAR SURGEON 01-20-2016)   OSA on CPAP    BiPAP  Prostate cancer ([place])    per patient "years ago"- had radiation implant   Spondylosis, cervical    Type 2 diabetes mellitus ([place])    Wears glasses     Allergies as of 05/12/2022       Reactions   Dilaudid [hydromorphone Hcl] Other (See Comments)   "acts a little off"   Toradol [ketorolac Tromethamine] Other (See Comments)   CauseD  hallucination ?-states not sure/ avoids per his MD   Tramadol    Couldn't sleep   Lyrica [pregabalin] Nausea Only, Other (See Comments)   Make me feel "bad"        Medication List     TAKE these medications    acetaminophen 500 MG tablet Commonly known as:  TYLENOL Take 1,000 mg by mouth every 6 (six) hours as needed for moderate pain.   allopurinol 300 MG tablet Commonly known as: ZYLOPRIM Take 300 mg by mouth at bedtime.   amLODipine 10 MG tablet Commonly known as: NORVASC TAKE 1 TABLET BY MOUTH EVERY DAY   aspirin EC 81 MG tablet Take 81 mg by mouth at bedtime.   celecoxib 200 MG capsule Commonly known as: CELEBREX Take 200 mg by mouth 2 (two) times daily.   clopidogrel 75 MG tablet Commonly known as: PLAVIX Take 75 mg by mouth daily.   colchicine 0.6 MG tablet Take 0.6 mg by mouth 2 (two) times daily as needed (gout).   docusate sodium 100 MG capsule Commonly known as: COLACE Take 100 mg by mouth at bedtime.   finasteride 5 MG tablet Commonly known as: PROSCAR Take 5 mg by mouth at bedtime.   furosemide 40 MG tablet Commonly known as: LASIX Take 40 mg by mouth every morning.   gabapentin 300 MG capsule Commonly known as: NEURONTIN Take 300 mg by mouth 3 (three) times daily.   ibuprofen 200 MG tablet Commonly known as: ADVIL Take 400 mg by mouth every 6 (six) hours as needed for moderate pain.   labetalol 300 MG tablet Commonly known as: NORMODYNE Take 300 mg by mouth 2 (two) times daily.   losartan 100 MG tablet Commonly known as: COZAAR Take 1 tablet (100 mg total) by mouth every evening.   metFORMIN 500 MG 24 hr tablet Commonly known as: GLUCOPHAGE-XR Take 1,000 mg by mouth in the morning and at bedtime.   Omega-3-6-9 Caps Take 1 capsule by mouth 2 (two) times daily.   oxyCODONE-acetaminophen 5-325 MG tablet Commonly known as: PERCOCET/ROXICET Take 1 tablet by mouth every 6 (six) hours as needed for moderate pain.   polyethylene glycol powder 17 GM/SCOOP powder Commonly known as: GLYCOLAX/MIRALAX TAKE '17MG'$ (1CAPFUL) BY MOUTH(MIXED WITH WATER/JUICE) DAILY What changed: See the new instructions.   rosuvastatin 10 MG tablet Commonly known as: CRESTOR Take 10 mg by mouth every morning.    tamsulosin 0.4 MG Caps capsule Commonly known as: FLOMAX Take 0.4 mg by mouth daily.   Vitamin D (Ergocalciferol) 1.25 MG (50000 UNIT) Caps capsule Commonly known as: DRISDOL Take 50,000 Units by mouth every Thursday.         Discharge Instructions: ***  [facility] Discharge Instructions Carotid Endarterectomy (CEA)  Please refer to the following instructions for your post-procedure care. Your surgeon or physician assistant will discuss any changes with you.  Activity  You are encouraged to walk as much as you can. You can slowly return to normal activities but must avoid strenuous activity and heavy lifting until your doctor tell you it's  OK. Avoid activities such as vacuuming or swinging a golf club. You can drive after one week if you are comfortable and you are no longer taking prescription pain medications. It is normal to feel tired for serval weeks after your surgery. It is also normal to have difficulty with sleep habits, eating, and bowel movements after surgery. These will go away with time.  Bathing/Showering  You may shower after you come home. Do not soak in a bathtub, hot tub, or swim until the incision heals completely.  Incision Care  Shower every day. Clean your incision with mild soap and water. Pat the area dry with a clean towel. You do not need a bandage unless otherwise instructed. Do not apply any ointments or creams to your incision. You may have skin glue on your incision. Do not peel it off. It will come off on its own in about one week. Your incision may feel thickened and raised for several weeks after your surgery. This is normal and the skin will soften over time. For Men Only: It's OK to shave around the incision but do not shave the incision itself for 2 weeks. It is common to have numbness under your chin that could last for several months.  Diet  Resume your normal diet. There are no special food restrictions  following this procedure. A low fat/low cholesterol diet is recommended for all patients with vascular disease. In order to heal from your surgery, it is CRITICAL to get adequate nutrition. Your body requires vitamins, minerals, and protein. Vegetables are the best source of vitamins and minerals. Vegetables also provide the perfect balance of protein. Processed food has little nutritional value, so try to avoid this.  Medications  Resume taking all of your medications unless your doctor or physician assistant tells you not to.  If your incision is causing pain, you may take over-the- counter pain relievers such as acetaminophen (Tylenol). If you were prescribed a stronger pain medication, please be aware these medications can cause nausea and constipation.  Prevent nausea by taking the medication with a snack or meal. Avoid constipation by drinking plenty of fluids and eating foods with a high amount of fiber, such as fruits, vegetables, and grains. Do not take Tylenol if you are taking prescription pain medications.  Follow Up  Our office will schedule a follow up appointment 2-3 weeks following discharge.  Please call us immediately for any of the following conditions  Increased pain, redness, drainage (pus) from your incision site. Fever of 101 degrees or higher. If you should develop stroke (slurred speech, difficulty swallowing, weakness on one side of your body, loss of vision) you should call 911 and go to the nearest emergency room.  Reduce your risk of vascular disease:  Stop smoking. If you would like help call [facility] at 1-800-QUIT-NOW [phone]) or [place] at [phone]. Manage your cholesterol Maintain a desired weight Control your diabetes Keep your blood pressure down  If you have any questions, please call the office at [phone].  Prescriptions given: *** #{Numbers; 0-50 (by 5):15924} No Refill  Disposition: ***  Patient's condition: is  {GOOD/POOR_MW:20011}  Follow up: 1. Dr. Marland Kitchen in 2 weeks.   Dagoberto Ligas, PA-C [facility] [phone]   --- For [facility] Registry use ---   Modified Rankin score at D/C (0-6): ***  IV medication needed for:  1. Hypertension: {yes/no:20286} 2. Hypotension: {ZYS/AY:30160}  Post-op Complications: {FUX/NA:35573}  1. Post-op CVA or TIA: {yes/no:20286}  If yes: Event classification (  right eye, left eye, right cortical, left cortical, verterobasilar, other): ***  If yes: Timing of event (intra-op, <6 hrs post-op, >=6 hrs post-op, unknown): ***  2. CN injury: {yes/no:20286}  If yes: CN *** injuried   3. Myocardial infarction: {yes/no:20286}  If yes: Dx by (EKG or clinical, Troponin): ***  4.  CHF: {yes/no:20286}  5.  Dysrhythmia (new): {yes/no:20286}  6. Wound infection: {yes/no:20286}  7. Reperfusion symptoms: {yes/no:20286}  8. Return to OR: {yes/no:20286}  If yes: return to OR for (bleeding, neurologic, other CEA incision, other): ***  Discharge medications: Statin use:  {yes/no:20286} ASA use:  {yes/no:20286}   Beta blocker use:  {yes/no:20286} ACE-Inhibitor use:  {yes/no:20286}  ARB use:  {yes/no:20286} CCB use: {yes/no:20286} P2Y12 Antagonist use: {yes/no:20286}, '[ ]'$  Plavix, '[ ]'$  Plasugrel, '[ ]'$  Ticlopinine, '[ ]'$  Ticagrelor, '[ ]'$  Other, '[ ]'$  No for medical reason, '[ ]'$  Non-compliant, '[ ]'$  Not-indicated Anti-coagulant use:  {yes/no:20286}, '[ ]'$  Warfarin, '[ ]'$  Rivaroxaban, '[ ]'$  Dabigatran,  ***

## 2022-05-12 NOTE — Progress Notes (Signed)
D/c tele and IV. Went over AVS with pt and all questions were addressed.   Aking Klabunde S Cherity Blickenstaff, RN  

## 2022-05-12 NOTE — Discharge Instructions (Signed)
   [facility] [place]  Discharge Instructions   Carotid Endarterectomy (CEA)  Please refer to the following instructions for your post-procedure care. Your surgeon or physician assistant will discuss any changes with you.  Activity  You are encouraged to walk as much as you can. You can slowly return to normal activities but must avoid strenuous activity and heavy lifting until your doctor tell you it's OK. Avoid activities such as vacuuming or swinging a golf club. You can drive after one week if you are comfortable and you are no longer taking prescription pain medications. It is normal to feel tired for serval weeks after your surgery. It is also normal to have difficulty with sleep habits, eating, and bowel movements after surgery. These will go away with time.  Bathing/Showering  You may shower after you come home. Do not soak in a bathtub, hot tub, or swim until the incision heals completely.  Incision Care  Shower every day. Clean your incision with mild soap and water. Pat the area dry with a clean towel. You do not need a bandage unless otherwise instructed. Do not apply any ointments or creams to your incision. You may have skin glue on your incision. Do not peel it off. It will come off on its own in about one week. Your incision may feel thickened and raised for several weeks after your surgery. This is normal and the skin will soften over time. For Men Only: It's OK to shave around the incision but do not shave the incision itself for 2 weeks. It is common to have numbness under your chin that could last for several months.  Diet  Resume your normal diet. There are no special food restrictions following this procedure. A low fat/low cholesterol diet is recommended for all patients with vascular disease. In order to heal from your surgery, it is CRITICAL to get adequate nutrition. Your body requires vitamins, minerals, and protein. Vegetables are the best  source of vitamins and minerals. Vegetables also provide the perfect balance of protein. Processed food has little nutritional value, so try to avoid this.        Medications  Resume taking all of your medications unless your doctor or physician assistant tells you not to. If your incision is causing pain, you may take over-the- counter pain relievers such as acetaminophen (Tylenol). If you were prescribed a stronger pain medication, please be aware these medications can cause nausea and constipation. Prevent nausea by taking the medication with a snack or meal. Avoid constipation by drinking plenty of fluids and eating foods with a high amount of fiber, such as fruits, vegetables, and grains. Do not take Tylenol if you are taking prescription pain medications.  Follow Up  Our office will schedule a follow up appointment 2-3 weeks following discharge.  Please call us immediately for any of the following conditions  Increased pain, redness, drainage (pus) from your incision site. Fever of 101 degrees or higher. If you should develop stroke (slurred speech, difficulty swallowing, weakness on one side of your body, loss of vision) you should call 911 and go to the nearest emergency room.  Reduce your risk of vascular disease:  Stop smoking. If you would like help call [facility] at 1-800-QUIT-NOW (1-800-784-8669) or [place] at [phone]. Manage your cholesterol Maintain a desired weight Control your diabetes Keep your blood pressure down  If you have any questions, please call the office at [phone].   

## 2022-05-14 ENCOUNTER — Encounter (HOSPITAL_COMMUNITY): Payer: Self-pay | Admitting: Vascular Surgery

## 2022-06-07 ENCOUNTER — Encounter: Payer: Self-pay | Admitting: Pulmonary Disease

## 2022-06-07 ENCOUNTER — Other Ambulatory Visit: Payer: Self-pay | Admitting: *Deleted

## 2022-06-07 ENCOUNTER — Ambulatory Visit: Payer: Medicare HMO | Admitting: Pulmonary Disease

## 2022-06-07 DIAGNOSIS — I6523 Occlusion and stenosis of bilateral carotid arteries: Secondary | ICD-10-CM

## 2022-06-07 DIAGNOSIS — Z720 Tobacco use: Secondary | ICD-10-CM | POA: Diagnosis not present

## 2022-06-07 DIAGNOSIS — R634 Abnormal weight loss: Secondary | ICD-10-CM | POA: Diagnosis not present

## 2022-06-07 DIAGNOSIS — G4733 Obstructive sleep apnea (adult) (pediatric): Secondary | ICD-10-CM | POA: Diagnosis not present

## 2022-06-07 NOTE — Assessment & Plan Note (Signed)
He had significant weight loss about 50 pounds over the last few years which is unexplained.  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  He denies overt depression. He is a heavy smoker, more than 40 pack years and would certainly qualify for a screening CT scan but due to his weight loss will order CT chest without contrast

## 2022-06-07 NOTE — Patient Instructions (Signed)
  BiPAP is working well  X Ct chest wo con for weight loss

## 2022-06-07 NOTE — Progress Notes (Signed)
   Subjective:    Patient ID: [name], male    DOB: [birth year], 76 y.o.   MRN: [id number]  HPI  76 yo smoker for FU of OSA  -wife Rod Holler has lung cancer  PMH -diabetes, prostate cancer ,tremors Diagnosed with moderate obstructive sleep apnea in 2003 Currently on BiPAP of 14/11  2-year follow-up visit, last seen 2021. He continues to lose weight.  Was 213 initially dropped down to 200 by last visit in 2021 and has now dropped to 182 pounds.  He is the primary caregiver for his wife.  He states that he has only problems that he does not know why he is losing weight. He continues to smoke about a pack per day. He underwent right CEA  Continues to use BiPAP every night and this certainly seems to be helping.  He has been on BiPAP for many years and has become a habit  Significant tests/ events reviewed NPSG 2003 >AHI 16/hr     Review of Systems neg for any significant sore throat, dysphagia, itching, sneezing, nasal congestion or excess/ purulent secretions, fever, chills, sweats, unintended wt loss, pleuritic or exertional cp, hempoptysis, orthopnea pnd or change in chronic leg swelling. Also denies presyncope, palpitations, heartburn, abdominal pain, nausea, vomiting, diarrhea or change in bowel or urinary habits, dysuria,hematuria, rash, arthralgias, visual complaints, headache, numbness weakness or ataxia.     Objective:   Physical Exam  Gen. Pleasant, elderly,well-nourished, in no distress ENT - no thrush, no pallor/icterus,no post nasal drip, rt neck post op bruising Neck: No JVD, no thyromegaly, no carotid bruits Lungs: no use of accessory muscles, no dullness to percussion, clear without rales or rhonchi  Cardiovascular: Rhythm regular, heart sounds  normal, no murmurs or gallops, no peripheral edema Musculoskeletal: No deformities, no cyanosis or clubbing         Assessment & Plan:

## 2022-06-07 NOTE — Assessment & Plan Note (Signed)
Smoking cessation was emphasized is the most important intervention that would add years to his life He would qualify for screening CT scan for lung cancer

## 2022-06-07 NOTE — Assessment & Plan Note (Signed)
BiPAP download was reviewed which shows excellent control of events on 14/11 cm.  He has a large leak.  He is very compliant without a single missed night. We discussed the close relation between weight and OSA and I offered him a repeat study to see if he still has OSA. He has been using BiPAP for so long that this will become a habit and he feels like he still needs it BiPAP is certainly helped improve his daytime somnolence and fatigue  Weight loss encouraged, compliance with goal of at least 4-6 hrs every night is the expectation. Advised against medications with sedative side effects Cautioned against driving when sleepy - understanding that sleepiness will vary on a day to day basis

## 2022-06-12 NOTE — Progress Notes (Unsigned)
  POST OPERATIVE OFFICE NOTE    CC:  F/u for surgery  HPI:  This is a 76 y.o. male who is s/p right TCAR on 05/11/22 by Dr. Donzetta Matters.    Pt returns today for follow up.  Pt states    Allergies  Allergen Reactions   Dilaudid [Hydromorphone Hcl] Other (See Comments)    "acts a little off"   Toradol [Ketorolac Tromethamine] Other (See Comments)    CauseD  hallucination ?-states not sure/ avoids per his MD   Tramadol     Couldn't sleep   Lyrica [Pregabalin] Nausea Only and Other (See Comments)    Make me feel "bad"    Current Outpatient Medications  Medication Sig Dispense Refill   acetaminophen (TYLENOL) 500 MG tablet Take 1,000 mg by mouth every 6 (six) hours as needed for moderate pain.     allopurinol (ZYLOPRIM) 300 MG tablet Take 300 mg by mouth at bedtime.     amLODipine (NORVASC) 10 MG tablet TAKE 1 TABLET BY MOUTH EVERY DAY 90 tablet 3   aspirin 81 MG EC tablet Take 81 mg by mouth at bedtime.     celecoxib (CELEBREX) 200 MG capsule Take 200 mg by mouth 2 (two) times daily.      clopidogrel (PLAVIX) 75 MG tablet Take 75 mg by mouth daily.   6   colchicine 0.6 MG tablet Take 0.6 mg by mouth 2 (two) times daily as needed (gout).      docusate sodium (COLACE) 100 MG capsule Take 100 mg by mouth at bedtime.     finasteride (PROSCAR) 5 MG tablet Take 5 mg by mouth at bedtime.     furosemide (LASIX) 40 MG tablet Take 40 mg by mouth every morning.     gabapentin (NEURONTIN) 300 MG capsule Take 300 mg by mouth 3 (three) times daily.     ibuprofen (ADVIL) 200 MG tablet Take 400 mg by mouth every 6 (six) hours as needed for moderate pain.     labetalol (NORMODYNE) 300 MG tablet Take 100 mg by mouth 2 (two) times daily.     losartan (COZAAR) 100 MG tablet Take 1 tablet (100 mg total) by mouth every evening. 30 tablet 6   metFORMIN (GLUCOPHAGE-XR) 500 MG 24 hr tablet Take 1,000 mg by mouth in the morning and at bedtime.     Omega-3-6-9 CAPS Take 1 capsule by mouth 2 (two) times daily.      oxyCODONE-acetaminophen (PERCOCET/ROXICET) 5-325 MG tablet Take 1 tablet by mouth every 6 (six) hours as needed for moderate pain. 15 tablet 0   polyethylene glycol powder (GLYCOLAX/MIRALAX) powder TAKE '17MG'$ (1CAPFUL) BY MOUTH(MIXED WITH WATER/JUICE) DAILY (Patient taking differently: Take 17 g by mouth at bedtime.) 527 g 0   rosuvastatin (CRESTOR) 10 MG tablet Take 10 mg by mouth every morning.      tamsulosin (FLOMAX) 0.4 MG CAPS capsule Take 0.4 mg by mouth daily.     Vitamin D, Ergocalciferol, (DRISDOL) 1.25 MG (50000 UNIT) CAPS capsule Take 50,000 Units by mouth every Thursday.     No current facility-administered medications for this visit.     ROS:  See HPI  Physical Exam:  ***  Incision:  *** Extremities:  *** Neuro: *** Abdomen:  ***    Assessment/Plan:  This is a 76 y.o. male who is s/p: ***  -***   Roxy Horseman PA-C [facility] [phone]   Clinic MD:  Donzetta Matters

## 2022-06-13 ENCOUNTER — Ambulatory Visit (HOSPITAL_COMMUNITY)
Admission: RE | Admit: 2022-06-13 | Discharge: 2022-06-13 | Disposition: A | Discharge status: Home / Self Care | Payer: Medicare HMO | Source: Ambulatory Visit | Attending: Vascular Surgery | Admitting: Vascular Surgery

## 2022-06-13 ENCOUNTER — Ambulatory Visit (INDEPENDENT_AMBULATORY_CARE_PROVIDER_SITE_OTHER): Payer: Medicare HMO | Admitting: Physician Assistant

## 2022-06-13 VITALS — BP 132/73 | HR 66 | Temp 97.9°F | Ht 67.0 in | Wt 181.0 lb

## 2022-06-13 DIAGNOSIS — I6523 Occlusion and stenosis of bilateral carotid arteries: Secondary | ICD-10-CM | POA: Diagnosis not present

## 2022-06-14 ENCOUNTER — Other Ambulatory Visit: Payer: Self-pay

## 2022-06-14 DIAGNOSIS — I6523 Occlusion and stenosis of bilateral carotid arteries: Secondary | ICD-10-CM

## 2022-07-03 ENCOUNTER — Ambulatory Visit (HOSPITAL_COMMUNITY)
Admission: RE | Admit: 2022-07-03 | Discharge: 2022-07-03 | Disposition: A | Discharge status: Home / Self Care | Payer: Medicare HMO | Source: Ambulatory Visit | Attending: Pulmonary Disease | Admitting: Pulmonary Disease

## 2022-07-03 DIAGNOSIS — J439 Emphysema, unspecified: Secondary | ICD-10-CM | POA: Insufficient documentation

## 2022-07-03 DIAGNOSIS — I700 Atherosclerosis of aorta: Secondary | ICD-10-CM | POA: Insufficient documentation

## 2022-07-03 DIAGNOSIS — R911 Solitary pulmonary nodule: Secondary | ICD-10-CM | POA: Diagnosis not present

## 2022-07-03 DIAGNOSIS — R634 Abnormal weight loss: Secondary | ICD-10-CM | POA: Insufficient documentation

## 2022-07-06 ENCOUNTER — Telehealth: Payer: Self-pay | Admitting: Pulmonary Disease

## 2022-07-06 NOTE — Telephone Encounter (Signed)
I called and spoke with the receptionist at [facility] and gave her the information I could see in the last office visit and she reprots she will take the information and have Stacy call back if more information is needed. Nothing further needed.

## 2022-07-09 ENCOUNTER — Telehealth: Payer: Self-pay | Admitting: Pulmonary Disease

## 2022-07-09 NOTE — Telephone Encounter (Signed)
Patient would like results of CT scan done on 07/03/2022. Patient phone number is [phone].    Dr. Elsworth Soho please advise

## 2022-07-10 ENCOUNTER — Encounter: Payer: Self-pay | Admitting: Cardiology

## 2022-07-10 NOTE — Telephone Encounter (Signed)
Spoke with patient regarding CT  results. They verbalized understanding. No further questions.  Nothing further needed at this time.

## 2022-07-10 NOTE — Telephone Encounter (Signed)
Atc lmtcb  

## 2022-07-18 ENCOUNTER — Encounter: Payer: Self-pay | Admitting: Cardiology

## 2022-07-18 ENCOUNTER — Ambulatory Visit: Payer: Medicare HMO | Attending: Cardiology | Admitting: Cardiology

## 2022-07-18 VITALS — BP 130/60 | HR 68 | Ht 67.0 in | Wt 183.0 lb

## 2022-07-18 DIAGNOSIS — I10 Essential (primary) hypertension: Secondary | ICD-10-CM

## 2022-07-18 DIAGNOSIS — I350 Nonrheumatic aortic (valve) stenosis: Secondary | ICD-10-CM

## 2022-07-18 DIAGNOSIS — I2510 Atherosclerotic heart disease of native coronary artery without angina pectoris: Secondary | ICD-10-CM | POA: Diagnosis not present

## 2022-07-18 DIAGNOSIS — I6523 Occlusion and stenosis of bilateral carotid arteries: Secondary | ICD-10-CM | POA: Diagnosis not present

## 2022-07-18 NOTE — Progress Notes (Signed)
Clinical Summary Mr. Ybarra is a 76 y.o.male seen today for follow up of the following medical problems.     1. HTN -compliant with meds - dizzy spells have resolved with spreading out bp meds throughout the day     2. Carotid stenosis - RICA >17%, LICA <51%.  - has been on ASA and plavix, presume due to his history cerebrovascular disease  0/2585 RICA 27-78%, LICA 24-23%    01/3613 ICA stenting with Dr Donzetta Matters 12/3152 RICA mild, LICA 00-86% - continues to follow with vascular   3. Hyperlipidemia - compliant with statin - reports most recent labs with pcp   4. OSA - compliant with CPAP - followed by Dr Elsworth Soho   5. Prostate CA - history of prior seed implant, reports cancer free   6. CAD - incidental findings form prior chest CT in 2013. The patient has not had symptoms of angina - 05/2017 nuclear stress: possible mild inferoseptal ischemia vs artifact, low risk   - no chest pains, no SOB/DOE   7. Moderate aortic aortic stenosis - 08/2021 echo moderate AS mean 27 mmHg, AVA VTI 1.05    8. AAA screen - male over 71 former smoker - 02/2014 CT showed no aneurysm Past Medical History:  Diagnosis Date   Bilateral renal cysts    Bladder cancer ([place]) UROLOGIST-  DR [facility]   RECURRENT 11/ 2017 --  hx turbt in 2004 and 08-19-2013   BPH without obstruction/lower urinary tract symptoms    Cervical fusion syndrome    LEFT ARM NUMBNESS--  CONTROLLED W/ GABAPENTIN   Coronary artery disease    CARDIOLOGIST-- DR Harl Bowie [name])   DDD (degenerative disc disease), cervical    DDD (degenerative disc disease), lumbar    Diverticulosis of sigmoid colon    Heart murmur    slight murmur per patient   History of acute gouty arthritis    History of adenomatous polyp of colon    2001;  2012- non-malignant leiomyoma;  04-07-2014  tubualr adenoma and hyperplastic polyp's   History of prostate cancer UROLOGIST-  DR [facility]   S/P  RADIOACTIVE SEED IMPLANTS 2004   HOH (hard of  hearing)    no hearing aids   Hyperlipidemia    Hypertension    OA (osteoarthritis)    Occlusion and stenosis of carotid artery without mention of cerebral infarction CARDIOLOGIST  -- DR  Roderic Palau Lennis Rader   LAST DUPLEX  11-23-2015  RICA  (DUPLEX DOPPLER 01-20-2016 RICA 76-19% PROXIMAL) /   LICA <50% (HAD CONSULT W/ [name] , VASCULAR SURGEON 01-20-2016)   OSA on CPAP    BiPAP   Prostate cancer ([place])    per patient "years ago"- had radiation implant   Spondylosis, cervical    Type 2 diabetes mellitus ([facility])    Wears glasses      Allergies  Allergen Reactions   Dilaudid [Hydromorphone Hcl] Other (See Comments)    "acts a little off"   Lyrica [Pregabalin] Nausea Only and Other (See Comments)    Make me feel "bad"   Toradol [Ketorolac Tromethamine] Other (See Comments)    CauseD  hallucination ?-states not sure/ avoids per his MD   Tramadol     Couldn't sleep     Current Outpatient Medications  Medication Sig Dispense Refill   acetaminophen (TYLENOL) 500 MG tablet Take 1,000 mg by mouth every 6 (six) hours as needed for moderate pain.     allopurinol (ZYLOPRIM) 300 MG tablet  Take 300 mg by mouth at bedtime.     amLODipine (NORVASC) 10 MG tablet TAKE 1 TABLET BY MOUTH EVERY DAY 90 tablet 3   aspirin 81 MG EC tablet Take 81 mg by mouth at bedtime.     celecoxib (CELEBREX) 200 MG capsule Take 200 mg by mouth 2 (two) times daily.      clopidogrel (PLAVIX) 75 MG tablet Take 75 mg by mouth daily.   6   colchicine 0.6 MG tablet Take 0.6 mg by mouth 2 (two) times daily as needed (gout).      docusate sodium (COLACE) 100 MG capsule Take 100 mg by mouth at bedtime.     finasteride (PROSCAR) 5 MG tablet Take 5 mg by mouth at bedtime.     furosemide (LASIX) 40 MG tablet Take 40 mg by mouth every morning.     gabapentin (NEURONTIN) 300 MG capsule Take 300 mg by mouth 3 (three) times daily.     ibuprofen (ADVIL) 200 MG tablet Take 400 mg by mouth every 6 (six) hours as needed for moderate  pain.     labetalol (NORMODYNE) 300 MG tablet Take 100 mg by mouth 2 (two) times daily.     losartan (COZAAR) 100 MG tablet Take 1 tablet (100 mg total) by mouth every evening. 30 tablet 6   metFORMIN (GLUCOPHAGE-XR) 500 MG 24 hr tablet Take 1,000 mg by mouth in the morning and at bedtime.     Omega-3-6-9 CAPS Take 1 capsule by mouth 2 (two) times daily.     oxyCODONE-acetaminophen (PERCOCET/ROXICET) 5-325 MG tablet Take 1 tablet by mouth every 6 (six) hours as needed for moderate pain. 15 tablet 0   polyethylene glycol powder (GLYCOLAX/MIRALAX) powder TAKE '17MG'$ (1CAPFUL) BY MOUTH(MIXED WITH WATER/JUICE) DAILY (Patient taking differently: Take 17 g by mouth at bedtime.) 527 g 0   rosuvastatin (CRESTOR) 10 MG tablet Take 10 mg by mouth every morning.      tamsulosin (FLOMAX) 0.4 MG CAPS capsule Take 0.4 mg by mouth daily.     Vitamin D, Ergocalciferol, (DRISDOL) 1.25 MG (50000 UNIT) CAPS capsule Take 50,000 Units by mouth every Thursday.     No current facility-administered medications for this visit.     Past Surgical History:  Procedure Laterality Date   ANTERIOR REMOVAL CAGE AND PLATE C3-C7/ CORPECTOMY C7 (FX)/  ALLOGRAFT AND FUSION C3 -- T1/  POSTERIOR DECOMPRESSION BILATERAL LAMINECTOMY C4 -- 6 & PARTIAL C3/  POSTEROLATERAL ARTHRODESIS C3 - T1  06/05/2005   APPENDECTOMY  1990'S   CATARACT EXTRACTION W/ INTRAOCULAR LENS  IMPLANT, BILATERAL  2013   CERVICAL FUSION  05/11/2005   C3  --  C7/  due to post op quadriparesis same day s/p  anterior C 4,5,6, colpectomy, decompression, removal epidural hematoma, foraminnotomy, cage and plate   COLONOSCOPY  last one 04-07-2014   CYSTOSCOPY W/ RETROGRADES Bilateral 08/19/2013   Procedure: CYSTOSCOPY WITH RETROGRADE PYELOGRAM;  Surgeon: Molli Hazard, MD;  Location: [facility];  Service: Urology;  Laterality: Bilateral;   CYSTOSCOPY W/ RETROGRADES Bilateral 07/18/2016   Procedure: CYSTOSCOPY WITH RETROGRADE PYELOGRAM;  Surgeon:  Alexis Frock, MD;  Location: [facility];  Service: Urology;  Laterality: Bilateral;   ENDOSCOPIC REPAIR CSF LEAK VIA NASAL PASSAGE  2011   INTRAOPERATIVE ARTERIOGRAM  CATH LAB  01-05-2009  DR Einar Gip   RICA   ACUTE ANGLE 80-85% STENOSIS   KNEE ARTHROSCOPY Left 07/15/2017   Procedure: ARTHROSCOPY LEFT  KNEE AND DEBRIDEMENT, medial meniscal tear and  chondromalaita;  Surgeon: Paralee Cancel, MD;  Location: [facility];  Service: Orthopedics;  Laterality: Left;  [place]  03-26-2012   L3 --  L5   RADIOACTIVE PROSTATE SEED IMPLANTS  08-19-2003   SEPTOPLASTY  1998   TOTAL KNEE ARTHROPLASTY Left 02/25/2018   Procedure: LEFT TOTAL KNEE ARTHROPLASTY;  Surgeon: Paralee Cancel, MD;  Location: [facility];  Service: Orthopedics;  Laterality: Left;  70 mins   TRANSCAROTID ARTERY REVASCULARIZATION  Right 05/11/2022   Procedure: Right Transcarotid Artery Revascularization;  Surgeon: Waynetta Sandy, MD;  Location: [place];  Service: Vascular;  Laterality: Right;   TRANSURETHRAL RESECTION OF BLADDER TUMOR N/A 08/19/2013   Procedure: TRANSURETHRAL RESECTION OF BLADDER TUMOR (TURBT);  Surgeon: Molli Hazard, MD;  Location: [facility];  Service: Urology;  Laterality: N/A;   TRANSURETHRAL RESECTION OF BLADDER TUMOR N/A 07/18/2016   Procedure: TRANSURETHRAL RESECTION OF BLADDER TUMOR (TURBT);  Surgeon: Alexis Frock, MD;  Location: [facility];  Service: Urology;  Laterality: N/A;   YAG LASER APPLICATION  35/45/6256   Procedure: YAG LASER APPLICATION;  Surgeon: Elta Guadeloupe T. Gershon Crane, MD;  Location: [facility];  Service: Ophthalmology;  Laterality: Right;     Allergies  Allergen Reactions   Dilaudid [Hydromorphone Hcl] Other (See Comments)    "acts a little off"   Lyrica [Pregabalin] Nausea Only and Other (See Comments)    Make me feel "bad"   Toradol [Ketorolac Tromethamine] Other (See Comments)    CauseD  hallucination ?-states not sure/ avoids per his  MD   Tramadol     Couldn't sleep      Family History  Problem Relation Age of Onset   Breast cancer Mother        mets   Diabetes Mother    Heart disease Father        MI   Colon cancer Brother    Cancer Sister        male organs   Diabetes Sister        family hx   Arthritis Other        entire family   Diabetes Brother        x 2     Social History Mr. Schuneman reports that he has been smoking cigarettes. He has a 47.00 pack-year smoking history. He has never used smokeless tobacco. Mr. Fees reports no history of alcohol use.   Review of Systems CONSTITUTIONAL: No weight loss, fever, chills, weakness or fatigue.  HEENT: Eyes: No visual loss, blurred vision, double vision or yellow sclerae.No hearing loss, sneezing, congestion, runny nose or sore throat.  SKIN: No rash or itching.  CARDIOVASCULAR: per hpi RESPIRATORY: No shortness of breath, cough or sputum.  GASTROINTESTINAL: No anorexia, nausea, vomiting or diarrhea. No abdominal pain or blood.  GENITOURINARY: No burning on urination, no polyuria NEUROLOGICAL: No headache, dizziness, syncope, paralysis, ataxia, numbness or tingling in the extremities. No change in bowel or bladder control.  MUSCULOSKELETAL: No muscle, back pain, joint pain or stiffness.  LYMPHATICS: No enlarged nodes. No history of splenectomy.  PSYCHIATRIC: No history of depression or anxiety.  ENDOCRINOLOGIC: No reports of sweating, cold or heat intolerance. No polyuria or polydipsia.  Marland Kitchen   Physical Examination Today's Vitals   07/18/22 0936  BP: 130/60  Pulse: 68  SpO2: 96%  Weight: 183 lb (83 kg)  Height: '5\' 7"'$  (1.702 m)   Body mass index is 28.66 kg/m.  Gen: resting comfortably, no acute distress  HEENT: no scleral icterus, pupils equal round and reactive, no palptable cervical adenopathy,  CV: RRR, 3/6 systolic murmur rusb, no jvd Resp: Clear to auscultation bilaterally GI: abdomen is soft, non-tender, non-distended, normal bowel  sounds, no hepatosplenomegaly MSK: extremities are warm, no edema.  Skin: warm, no rash Neuro:  no focal deficits Psych: appropriate affect   Diagnostic Studies  05/2017 nuclear stress No diagnostic ST segment changes indicating ischemia. No arrhythmias. Moderate-sized, mild intensity, inferior and inferoseptal defects that are largely fixed with a partial region of reversibility in the mid inferoseptal zone. Normal wall motion in this area argues against scar. Although diaphragmatic attenuation is likely playing a role, consider a small region of inferoseptal ischemia. This is a low risk study. Nuclear stress EF: 64%.   Assessment and Plan  1 . HTN - he is at goal, continue current meds   2. Carotid stenosis -recent RICA stenting, continues to follow with vascular. He is on ASA/plavix per vascular   3. Hyperlipidemia - continue current meds   4. CAD - incidental finding on CT scan, he has not had any cardiac symptoms - prior low risk stress test - no symptoms, continue current meds   5. Aortic stenosis - moderate by [place], we will repeat monitor   EKG shows SR, no acute ischemic changes  Arnoldo Lenis, M.D

## 2022-07-18 NOTE — Patient Instructions (Addendum)
Medication Instructions:  Continue all current medications.  Labwork: none  Testing/Procedures: Your physician has requested that you have an echocardiogram. Echocardiography is a painless test that uses sound waves to create images of your heart. It provides your doctor with information about the size and shape of your heart and how well your heart's chambers and valves are working. This procedure takes approximately one hour. There are no restrictions for this procedure. Please do NOT wear cologne, perfume, aftershave, or lotions (deodorant is allowed). Please arrive 15 minutes prior to your appointment time. Office will contact with results via phone, letter or mychart.     Follow-Up: 6 months   Any Other Special Instructions Will Be Listed Below (If Applicable).   If you need a refill on your cardiac medications before your next appointment, please call your pharmacy.

## 2022-07-19 ENCOUNTER — Encounter: Payer: Self-pay | Admitting: *Deleted

## 2022-07-19 ENCOUNTER — Ambulatory Visit: Payer: Medicare HMO | Attending: Cardiology

## 2022-07-19 DIAGNOSIS — I350 Nonrheumatic aortic (valve) stenosis: Secondary | ICD-10-CM | POA: Diagnosis not present

## 2022-07-19 LAB — ECHOCARDIOGRAM COMPLETE
AR max vel: 1.1 cm2
AV Area VTI: 1.19 cm2
AV Area mean vel: 1.08 cm2
AV Mean grad: 20 mmHg
AV Peak grad: 33.3 mmHg
Ao pk vel: 2.89 m/s
Area-P 1/2: 2.56 cm2
Calc EF: 67.9 %
MV M vel: 3.11 m/s
MV Peak grad: 38.8 mmHg
P 1/2 time: 1549 msec
S' Lateral: 2.66 cm
Single Plane A2C EF: 68.4 %
Single Plane A4C EF: 66.4 %

## 2022-07-27 ENCOUNTER — Ambulatory Visit: Payer: Medicare HMO | Admitting: Cardiology

## 2022-08-20 ENCOUNTER — Telehealth: Payer: Self-pay | Admitting: *Deleted

## 2022-08-20 NOTE — Telephone Encounter (Signed)
Laurine Blazer, LPN 97/01/3004  1:10 PM EST Back to Top    Notified, copy to pcp.   Laurine Blazer, LPN 21/07/7355  7:01 PM EST     Left message to return call.   Arnoldo Lenis, MD 08/13/2022  8:31 AM EST     Heart pumping function is normal. Aortic valve is moderately stiffened, not severe enough to warrant intervention at this time but something we will continue to monitor   Zandra Abts MD

## 2022-09-03 ENCOUNTER — Ambulatory Visit (HOSPITAL_COMMUNITY)
Admission: RE | Admit: 2022-09-03 | Discharge: 2022-09-03 | Disposition: A | Discharge status: Home / Self Care | Payer: Medicare HMO | Source: Ambulatory Visit | Attending: Family Medicine | Admitting: Family Medicine

## 2022-09-03 ENCOUNTER — Other Ambulatory Visit (HOSPITAL_COMMUNITY): Payer: Self-pay | Admitting: Family Medicine

## 2022-09-03 DIAGNOSIS — J069 Acute upper respiratory infection, unspecified: Secondary | ICD-10-CM

## 2022-09-18 NOTE — Progress Notes (Unsigned)
HISTORY AND PHYSICAL     CC:  follow up. Requesting Provider:  [name], MD  HPI: This is a 77 y.o. male here for follow up for carotid artery stenosis.  Pt is s/p right TCAR for asymptomatic carotid artery stenosis on 05/11/2022 by Dr. Donzetta Matters.    Pt was last seen 06/13/2022 and at that time he was doing well without any neurological sx.  He was compliant with his asa/plavix/stain.  His duplex revealed the right ICA was 1-39% stenosis and the left 60-79% ICA stenosis.  He was scheduled for short follow up for repeat u/s.   Pt returns today for follow up.    Pt *** any amaurosis fugax, speech difficulties, weakness, numbness, paralysis or clumsiness or facial droop.    ***  The pt is on a statin for cholesterol management.  The pt is on a daily aspirin.   Other AC:  Plavix The pt is on CCB, BB, ARB for hypertension.   The pt does  have diabetes Tobacco hx:  ***  Pt does *** have family hx of AAA.  Past Medical History:  Diagnosis Date   Bilateral renal cysts    Bladder cancer ([place]) UROLOGIST-  DR [facility]   RECURRENT 11/ 2017 --  hx turbt in 2004 and 08-19-2013   BPH without obstruction/lower urinary tract symptoms    Cervical fusion syndrome    LEFT ARM NUMBNESS--  CONTROLLED W/ GABAPENTIN   Coronary artery disease    CARDIOLOGIST-- DR Harl Bowie [name])   DDD (degenerative disc disease), cervical    DDD (degenerative disc disease), lumbar    Diverticulosis of sigmoid colon    Heart murmur    slight murmur per patient   History of acute gouty arthritis    History of adenomatous polyp of colon    2001;  2012- non-malignant leiomyoma;  04-07-2014  tubualr adenoma and hyperplastic polyp's   History of prostate cancer UROLOGIST-  DR [facility]   S/P  RADIOACTIVE SEED IMPLANTS 2004   HOH (hard of hearing)    no hearing aids   Hyperlipidemia    Hypertension    OA (osteoarthritis)    Occlusion and stenosis of carotid artery without mention of cerebral infarction  CARDIOLOGIST  -- DR  [name]   LAST DUPLEX  11-23-2015  RICA  (DUPLEX DOPPLER 01-20-2016 RICA 12-45% PROXIMAL) /   LICA <80% (HAD CONSULT W/ [name] , VASCULAR SURGEON 01-20-2016)   OSA on CPAP    BiPAP   Prostate cancer ([place])    per patient "years ago"- had radiation implant   Spondylosis, cervical    Type 2 diabetes mellitus ([place])    Wears glasses     Past Surgical History:  Procedure Laterality Date   ANTERIOR REMOVAL CAGE AND PLATE C3-C7/ CORPECTOMY C7 (FX)/  ALLOGRAFT AND FUSION C3 -- T1/  POSTERIOR DECOMPRESSION BILATERAL LAMINECTOMY C4 -- 6 & PARTIAL C3/  POSTEROLATERAL ARTHRODESIS C3 - T1  06/05/2005   APPENDECTOMY  1990'S   CATARACT EXTRACTION W/ INTRAOCULAR LENS  IMPLANT, BILATERAL  2013   CERVICAL FUSION  05/11/2005   C3  --  C7/  due to post op quadriparesis same day s/p  anterior C 4,5,6, colpectomy, decompression, removal epidural hematoma, foraminnotomy, cage and plate   COLONOSCOPY  last one 04-07-2014   CYSTOSCOPY W/ RETROGRADES Bilateral 08/19/2013   Procedure: CYSTOSCOPY WITH RETROGRADE PYELOGRAM;  Surgeon: Molli Hazard, MD;  Location: [facility];  Service: Urology;  Laterality: Bilateral;  CYSTOSCOPY W/ RETROGRADES Bilateral 07/18/2016   Procedure: CYSTOSCOPY WITH RETROGRADE PYELOGRAM;  Surgeon: Alexis Frock, MD;  Location[facility];  Service: Urology;  Laterality: Bilateral;   ENDOSCOPIC REPAIR CSF LEAK VIA NASAL PASSAGE  2011   INTRAOPERATIVE ARTERIOGRAM  CATH LAB  01-05-2009  DR Einar Gip   RICA   ACUTE ANGLE 80-85% STENOSIS   KNEE ARTHROSCOPY Left 07/15/2017   Procedure: ARTHROSCOPY LEFT  KNEE AND DEBRIDEMENT, medial meniscal tear and chondromalaita;  Surgeon: Paralee Cancel, MD;  Location: [facility];  Service: Orthopedics;  Laterality: Left;  [place]  03-26-2012   L3 --  L5   RADIOACTIVE PROSTATE SEED IMPLANTS  08-19-2003   SEPTOPLASTY  1998   TOTAL KNEE ARTHROPLASTY Left 02/25/2018   Procedure: LEFT  TOTAL KNEE ARTHROPLASTY;  Surgeon: Paralee Cancel, MD;  Location: [facility];  Service: Orthopedics;  Laterality: Left;  70 mins   TRANSCAROTID ARTERY REVASCULARIZATION  Right 05/11/2022   Procedure: Right Transcarotid Artery Revascularization;  Surgeon: Waynetta Sandy, MD;  Location: [place];  Service: Vascular;  Laterality: Right;   TRANSURETHRAL RESECTION OF BLADDER TUMOR N/A 08/19/2013   Procedure: TRANSURETHRAL RESECTION OF BLADDER TUMOR (TURBT);  Surgeon: Molli Hazard, MD;  Location: [facility];  Service: Urology;  Laterality: N/A;   TRANSURETHRAL RESECTION OF BLADDER TUMOR N/A 07/18/2016   Procedure: TRANSURETHRAL RESECTION OF BLADDER TUMOR (TURBT);  Surgeon: Alexis Frock, MD;  Location: [facility];  Service: Urology;  Laterality: N/A;   YAG LASER APPLICATION  61/22/4497   Procedure: YAG LASER APPLICATION;  Surgeon: Elta Guadeloupe T. Gershon Crane, MD;  Location: [facility];  Service: Ophthalmology;  Laterality: Right;    Allergies  Allergen Reactions   Dilaudid [Hydromorphone Hcl] Other (See Comments)    "acts a little off"   Lyrica [Pregabalin] Nausea Only and Other (See Comments)    Make me feel "bad"   Toradol [Ketorolac Tromethamine] Other (See Comments)    CauseD  hallucination ?-states not sure/ avoids per his MD   Tramadol     Couldn't sleep    Current Outpatient Medications  Medication Sig Dispense Refill   acetaminophen (TYLENOL) 500 MG tablet Take 1,000 mg by mouth every 6 (six) hours as needed for moderate pain.     allopurinol (ZYLOPRIM) 300 MG tablet Take 300 mg by mouth at bedtime.     amLODipine (NORVASC) 10 MG tablet TAKE 1 TABLET BY MOUTH EVERY DAY 90 tablet 3   aspirin 81 MG EC tablet Take 81 mg by mouth at bedtime.     celecoxib (CELEBREX) 200 MG capsule Take 200 mg by mouth 2 (two) times daily.      clopidogrel (PLAVIX) 75 MG tablet Take 75 mg by mouth daily.   6   colchicine 0.6 MG tablet Take 0.6 mg by mouth 2 (two) times daily as  needed (gout).      docusate sodium (COLACE) 100 MG capsule Take 100 mg by mouth at bedtime.     finasteride (PROSCAR) 5 MG tablet Take 5 mg by mouth at bedtime.     furosemide (LASIX) 40 MG tablet Take 40 mg by mouth every morning.     gabapentin (NEURONTIN) 100 MG capsule Take 100 mg by mouth 3 (three) times daily.     ibuprofen (ADVIL) 200 MG tablet Take 400 mg by mouth every 6 (six) hours as needed for moderate pain.     labetalol (NORMODYNE) 300 MG tablet Take 100 mg by mouth 2 (  two) times daily.     losartan (COZAAR) 100 MG tablet Take 1 tablet (100 mg total) by mouth every evening. 30 tablet 6   metFORMIN (GLUCOPHAGE-XR) 500 MG 24 hr tablet Take 1,000 mg by mouth in the morning and at bedtime.     Omega-3-6-9 CAPS Take 1 capsule by mouth 2 (two) times daily.     oxyCODONE-acetaminophen (PERCOCET/ROXICET) 5-325 MG tablet Take 1 tablet by mouth every 6 (six) hours as needed for moderate pain. 15 tablet 0   polyethylene glycol powder (GLYCOLAX/MIRALAX) powder TAKE '17MG'$ (1CAPFUL) BY MOUTH(MIXED WITH WATER/JUICE) DAILY (Patient taking differently: Take 17 g by mouth at bedtime.) 527 g 0   rosuvastatin (CRESTOR) 10 MG tablet Take 10 mg by mouth every morning.      tamsulosin (FLOMAX) 0.4 MG CAPS capsule Take 0.4 mg by mouth daily.     Vitamin D, Ergocalciferol, (DRISDOL) 1.25 MG (50000 UNIT) CAPS capsule Take 50,000 Units by mouth every Thursday.     No current facility-administered medications for this visit.    Family History  Problem Relation Age of Onset   Breast cancer Mother        mets   Diabetes Mother    Heart disease Father        MI   Colon cancer Brother    Cancer Sister        male organs   Diabetes Sister        family hx   Arthritis Other        entire family   Diabetes Brother        x 2    Social History   Socioeconomic History   Marital status: Married    Spouse name: Not on file   Number of children: 2   Years of education: Not on file   Highest  education level: Not on file  Occupational History   Occupation: retired//disabled  Tobacco Use   Smoking status: Every Day    Packs/day: 1.00    Years: 47.00    Total pack years: 47.00    Types: Cigarettes   Smokeless tobacco: Never   Tobacco comments:    pt quits smoking periodically :: started smoking again Jan 2018 1ppd  Vaping Use   Vaping Use: Never used  Substance and Sexual Activity   Alcohol use: No    Alcohol/week: 0.0 standard drinks of alcohol   Drug use: No   Sexual activity: Not on file  Other Topics Concern   Not on file  Social History Narrative   Not on file   Social Determinants of Health   Financial Resource Strain: Not on file  Food Insecurity: Not on file  Transportation Needs: Not on file  Physical Activity: Not on file  Stress: Not on file  Social Connections: Not on file  Intimate Partner Violence: Not on file     REVIEW OF SYSTEMS:  *** '[X]'$  denotes positive finding, '[ ]'$  denotes negative finding Cardiac  Comments:  Chest pain or chest pressure:    Shortness of breath upon exertion:    Short of breath when lying flat:    Irregular heart rhythm:        Vascular    Pain in calf, thigh, or hip brought on by ambulation:    Pain in feet at night that wakes you up from your sleep:     Blood clot in your veins:    Leg swelling:         Pulmonary  Oxygen at home:    Productive cough:     Wheezing:         Neurologic    Sudden weakness in arms or legs:     Sudden numbness in arms or legs:     Sudden onset of difficulty speaking or slurred speech:    Temporary loss of vision in one eye:     Problems with dizziness:         Gastrointestinal    Blood in stool:     Vomited blood:         Genitourinary    Burning when urinating:     Blood in urine:        Psychiatric    Major depression:         Hematologic    Bleeding problems:    Problems with blood clotting too easily:        Skin    Rashes or ulcers:        Constitutional     Fever or chills:      PHYSICAL EXAMINATION:  ***  General:  WDWN in NAD; vital signs documented above Gait: Not observed HENT: WNL, normocephalic Pulmonary: normal non-labored breathing Cardiac: {Desc; regular/irreg:14544} HR, {With/Without:20273} carotid bruit*** Abdomen: soft, NT; aortic pulse is *** palpable Skin: {With/Without:20273} rashes Vascular Exam/Pulses:  Right Left  Radial {Exam; arterial pulse strength 0-4:30167} {Exam; arterial pulse strength 0-4:30167}  Popliteal {Exam; arterial pulse strength 0-4:30167} {Exam; arterial pulse strength 0-4:30167}  DP {Exam; arterial pulse strength 0-4:30167} {Exam; arterial pulse strength 0-4:30167}  PT {Exam; arterial pulse strength 0-4:30167} {Exam; arterial pulse strength 0-4:30167}   Extremities: {With/Without:20273} open wounds Musculoskeletal: no muscle wasting or atrophy  Neurologic: A&O X 3; moving all extremities equally; speech is fluent/normal Psychiatric:  The pt has {Desc; normal/abnormal:11317::"Normal"} affect.   Non-Invasive Vascular Imaging:   Carotid Duplex on 09/19/2022 Right:  ***% ICA stenosis Left:  ***% ICA stenosis ***  Previous Carotid duplex on 06/13/2022: Right: 1-39% ICA stenosis Left:   60-79% ICA stenosis    ASSESSMENT/PLAN:: 77 y.o. male here for follow up carotid artery stenosis and s/p right TCAR for asymptomatic carotid artery stenosis on 05/11/2022 by Dr. Donzetta Matters.    -duplex today reveals *** -discussed s/s of stroke with pt and he understands should he develop any of these sx, he will go to the nearest ER or call 911. -pt will f/u in *** with carotid duplex -pt will call sooner should ***he have any issues. -continue statin/asa/plavix   Leontine Locket, [facility] [phone]  Clinic MD:  Donzetta Matters

## 2022-09-19 ENCOUNTER — Inpatient Hospital Stay (HOSPITAL_COMMUNITY)
Admission: EM | Admit: 2022-09-19 | Discharge: 2022-09-21 | DRG: 379 | Disposition: A | Discharge status: Home / Self Care | Payer: Medicare HMO | Attending: Family Medicine | Admitting: Family Medicine

## 2022-09-19 ENCOUNTER — Other Ambulatory Visit: Payer: Self-pay

## 2022-09-19 ENCOUNTER — Emergency Department (HOSPITAL_COMMUNITY): Payer: Medicare HMO

## 2022-09-19 ENCOUNTER — Ambulatory Visit (INDEPENDENT_AMBULATORY_CARE_PROVIDER_SITE_OTHER)
Admission: RE | Admit: 2022-09-19 | Discharge: 2022-09-19 | Disposition: A | Discharge status: Home / Self Care | Payer: Medicare HMO | Source: Ambulatory Visit | Attending: Vascular Surgery | Admitting: Vascular Surgery

## 2022-09-19 ENCOUNTER — Ambulatory Visit: Payer: Medicare HMO | Admitting: Physician Assistant

## 2022-09-19 VITALS — BP 148/72 | HR 89 | Temp 98.0°F | Ht 67.0 in | Wt 181.0 lb

## 2022-09-19 DIAGNOSIS — Z7902 Long term (current) use of antithrombotics/antiplatelets: Secondary | ICD-10-CM

## 2022-09-19 DIAGNOSIS — Z1152 Encounter for screening for COVID-19: Secondary | ICD-10-CM

## 2022-09-19 DIAGNOSIS — E119 Type 2 diabetes mellitus without complications: Secondary | ICD-10-CM

## 2022-09-19 DIAGNOSIS — K5731 Diverticulosis of large intestine without perforation or abscess with bleeding: Secondary | ICD-10-CM | POA: Diagnosis present

## 2022-09-19 DIAGNOSIS — Z885 Allergy status to narcotic agent status: Secondary | ICD-10-CM

## 2022-09-19 DIAGNOSIS — H9190 Unspecified hearing loss, unspecified ear: Secondary | ICD-10-CM | POA: Diagnosis present

## 2022-09-19 DIAGNOSIS — Z86010 Personal history of colonic polyps: Secondary | ICD-10-CM

## 2022-09-19 DIAGNOSIS — I2510 Atherosclerotic heart disease of native coronary artery without angina pectoris: Secondary | ICD-10-CM | POA: Diagnosis present

## 2022-09-19 DIAGNOSIS — I6523 Occlusion and stenosis of bilateral carotid arteries: Secondary | ICD-10-CM | POA: Diagnosis present

## 2022-09-19 DIAGNOSIS — Z888 Allergy status to other drugs, medicaments and biological substances status: Secondary | ICD-10-CM

## 2022-09-19 DIAGNOSIS — Z9841 Cataract extraction status, right eye: Secondary | ICD-10-CM

## 2022-09-19 DIAGNOSIS — E1151 Type 2 diabetes mellitus with diabetic peripheral angiopathy without gangrene: Secondary | ICD-10-CM | POA: Diagnosis present

## 2022-09-19 DIAGNOSIS — D649 Anemia, unspecified: Secondary | ICD-10-CM | POA: Diagnosis present

## 2022-09-19 DIAGNOSIS — Z8249 Family history of ischemic heart disease and other diseases of the circulatory system: Secondary | ICD-10-CM | POA: Diagnosis not present

## 2022-09-19 DIAGNOSIS — B9681 Helicobacter pylori [H. pylori] as the cause of diseases classified elsewhere: Secondary | ICD-10-CM | POA: Diagnosis present

## 2022-09-19 DIAGNOSIS — Z96652 Presence of left artificial knee joint: Secondary | ICD-10-CM | POA: Diagnosis present

## 2022-09-19 DIAGNOSIS — Z803 Family history of malignant neoplasm of breast: Secondary | ICD-10-CM

## 2022-09-19 DIAGNOSIS — I10 Essential (primary) hypertension: Secondary | ICD-10-CM | POA: Diagnosis present

## 2022-09-19 DIAGNOSIS — E538 Deficiency of other specified B group vitamins: Secondary | ICD-10-CM | POA: Diagnosis present

## 2022-09-19 DIAGNOSIS — Z8546 Personal history of malignant neoplasm of prostate: Secondary | ICD-10-CM | POA: Diagnosis not present

## 2022-09-19 DIAGNOSIS — G4733 Obstructive sleep apnea (adult) (pediatric): Secondary | ICD-10-CM | POA: Diagnosis present

## 2022-09-19 DIAGNOSIS — K5730 Diverticulosis of large intestine without perforation or abscess without bleeding: Secondary | ICD-10-CM | POA: Diagnosis not present

## 2022-09-19 DIAGNOSIS — D120 Benign neoplasm of cecum: Secondary | ICD-10-CM | POA: Diagnosis present

## 2022-09-19 DIAGNOSIS — Z79899 Other long term (current) drug therapy: Secondary | ICD-10-CM

## 2022-09-19 DIAGNOSIS — Z8551 Personal history of malignant neoplasm of bladder: Secondary | ICD-10-CM

## 2022-09-19 DIAGNOSIS — R1310 Dysphagia, unspecified: Secondary | ICD-10-CM | POA: Diagnosis present

## 2022-09-19 DIAGNOSIS — D62 Acute posthemorrhagic anemia: Secondary | ICD-10-CM | POA: Diagnosis present

## 2022-09-19 DIAGNOSIS — K921 Melena: Secondary | ICD-10-CM | POA: Diagnosis not present

## 2022-09-19 DIAGNOSIS — K2971 Gastritis, unspecified, with bleeding: Secondary | ICD-10-CM | POA: Diagnosis present

## 2022-09-19 DIAGNOSIS — Z800 Family history of malignant neoplasm of digestive organs: Secondary | ICD-10-CM

## 2022-09-19 DIAGNOSIS — K922 Gastrointestinal hemorrhage, unspecified: Secondary | ICD-10-CM | POA: Insufficient documentation

## 2022-09-19 DIAGNOSIS — Z66 Do not resuscitate: Secondary | ICD-10-CM | POA: Diagnosis present

## 2022-09-19 DIAGNOSIS — I4891 Unspecified atrial fibrillation: Secondary | ICD-10-CM | POA: Diagnosis present

## 2022-09-19 DIAGNOSIS — F17210 Nicotine dependence, cigarettes, uncomplicated: Secondary | ICD-10-CM | POA: Diagnosis present

## 2022-09-19 DIAGNOSIS — Z720 Tobacco use: Secondary | ICD-10-CM | POA: Diagnosis present

## 2022-09-19 DIAGNOSIS — Z961 Presence of intraocular lens: Secondary | ICD-10-CM | POA: Diagnosis present

## 2022-09-19 DIAGNOSIS — E785 Hyperlipidemia, unspecified: Secondary | ICD-10-CM | POA: Diagnosis present

## 2022-09-19 DIAGNOSIS — Z833 Family history of diabetes mellitus: Secondary | ICD-10-CM

## 2022-09-19 DIAGNOSIS — Z7982 Long term (current) use of aspirin: Secondary | ICD-10-CM

## 2022-09-19 DIAGNOSIS — Z9842 Cataract extraction status, left eye: Secondary | ICD-10-CM

## 2022-09-19 DIAGNOSIS — N400 Enlarged prostate without lower urinary tract symptoms: Secondary | ICD-10-CM | POA: Diagnosis present

## 2022-09-19 DIAGNOSIS — Z981 Arthrodesis status: Secondary | ICD-10-CM

## 2022-09-19 LAB — ABO/RH: ABO/RH(D): O POS

## 2022-09-19 LAB — RETICULOCYTES
Immature Retic Fract: 29.6 % — ABNORMAL HIGH (ref 2.3–15.9)
RBC.: 2.23 MIL/uL — ABNORMAL LOW (ref 4.22–5.81)
Retic Count, Absolute: 155 10*3/uL (ref 19.0–186.0)
Retic Ct Pct: 7 % — ABNORMAL HIGH (ref 0.4–3.1)

## 2022-09-19 LAB — BASIC METABOLIC PANEL
Anion gap: 9 (ref 5–15)
BUN: 28 mg/dL — ABNORMAL HIGH (ref 8–23)
CO2: 27 mmol/L (ref 22–32)
Calcium: 8.7 mg/dL — ABNORMAL LOW (ref 8.9–10.3)
Chloride: 104 mmol/L (ref 98–111)
Creatinine, Ser: 1.23 mg/dL (ref 0.61–1.24)
GFR, Estimated: >60 mL/min (ref >60)
Glucose, Bld: 167 mg/dL — ABNORMAL HIGH (ref 70–99)
Potassium: 3.9 mmol/L (ref 3.5–5.1)
Sodium: 140 mmol/L (ref 135–145)

## 2022-09-19 LAB — PREPARE RBC (CROSSMATCH)

## 2022-09-19 LAB — TROPONIN I (HIGH SENSITIVITY)
Troponin I (High Sensitivity): 5 ng/L (ref <18)
Troponin I (High Sensitivity): 5 ng/L (ref <18)

## 2022-09-19 LAB — IRON AND TIBC
Iron: 61 ug/dL (ref 45–182)
Saturation Ratios: 19 % (ref 17.9–39.5)
TIBC: 329 ug/dL (ref 250–450)
UIBC: 268 ug/dL

## 2022-09-19 LAB — CBC
HCT: 22.4 % — ABNORMAL LOW (ref 39.0–52.0)
Hemoglobin: 7 g/dL — ABNORMAL LOW (ref 13.0–17.0)
MCH: 31.5 pg (ref 26.0–34.0)
MCHC: 31.3 g/dL (ref 30.0–36.0)
MCV: 100.9 fL — ABNORMAL HIGH (ref 80.0–100.0)
Platelets: 195 10*3/uL (ref 150–400)
RBC: 2.22 MIL/uL — ABNORMAL LOW (ref 4.22–5.81)
RDW: 17.6 % — ABNORMAL HIGH (ref 11.5–15.5)
WBC: 7.3 10*3/uL (ref 4.0–10.5)
nRBC: 0 % (ref 0.0–0.2)

## 2022-09-19 LAB — FERRITIN: Ferritin: 11 ng/mL — ABNORMAL LOW (ref 24–336)

## 2022-09-19 LAB — VITAMIN B12: Vitamin B-12: 156 pg/mL — ABNORMAL LOW (ref 180–914)

## 2022-09-19 LAB — RESP PANEL BY RT-PCR (RSV, FLU A&B, COVID)  RVPGX2
Influenza A by PCR: NEGATIVE
Influenza B by PCR: NEGATIVE
Resp Syncytial Virus by PCR: NEGATIVE
SARS Coronavirus 2 by RT PCR: NEGATIVE

## 2022-09-19 LAB — FOLATE: Folate: 12.6 ng/mL (ref >5.9)

## 2022-09-19 LAB — CBG MONITORING, ED: Glucose-Capillary: 191 mg/dL — ABNORMAL HIGH (ref 70–99)

## 2022-09-19 MED ORDER — SODIUM CHLORIDE 0.9% IV SOLUTION: Freq: Once | INTRAVENOUS | Status: AC

## 2022-09-19 MED ORDER — PANTOPRAZOLE SODIUM 40 MG IV SOLR
40.0000 mg | Freq: Once | INTRAVENOUS | Status: AC
  Administered 2022-09-19: 40 mg via INTRAVENOUS
  Filled 2022-09-19: qty 10

## 2022-09-19 NOTE — ED Notes (Signed)
Pt to ED c/o chest pain and SHOB x 1 week,. Saw PCP yesterday received call today about "abnormal blood work" and told to come to ED, Pt does not remember the abnormal blood work.

## 2022-09-19 NOTE — ED Provider Notes (Signed)
[facility] EMERGENCY DEPARTMENT Provider Note   CSN: [id number] Arrival date & time: 09/19/22  1803     History {Add pertinent medical, surgical, social history, OB history to HPI:1} Chief Complaint  Patient presents with   Chest Pain   Shortness of Breath   Abnormal Lab    [name] is a 77 y.o. male.  HPI 77 year old male presents with abnormal lab.  He thinks his hemoglobin was low and he thinks it was around 7.  He had blood work yesterday and was called today.  He has been feeling poorly for couple weeks and was diagnosed with an infection and put on antibiotics.  Still has a cough over the last 3 days or so has been short of breath.  He is noticed dark stool over the last couple weeks though it seems to be improving.  He denies any abdominal pain or known history of ulcer.  He occasionally takes ibuprofen but not often.  Mostly takes Tylenol when he has pain.  He is on aspirin and Plavix.  He has been having some chest pain that he attributes to congestion for the last couple weeks.  Home Medications Prior to Admission medications   Medication Sig Start Date End Date Taking? Authorizing Provider  acetaminophen (TYLENOL) 500 MG tablet Take 1,000 mg by mouth every 6 (six) hours as needed for moderate pain.    [provider]  allopurinol (ZYLOPRIM) 300 MG tablet Take 300 mg by mouth at bedtime.    [provider]  amLODipine (NORVASC) 10 MG tablet TAKE 1 TABLET BY MOUTH EVERY DAY 07/24/18   Arnoldo Lenis, MD  aspirin 81 MG EC tablet Take 81 mg by mouth at bedtime.    [provider]  celecoxib (CELEBREX) 200 MG capsule Take 200 mg by mouth 2 (two) times daily.     [provider]  clopidogrel (PLAVIX) 75 MG tablet Take 75 mg by mouth daily.  07/11/16   [provider]  colchicine 0.6 MG tablet Take 0.6 mg by mouth 2 (two) times daily as needed (gout).     [provider]  docusate sodium (COLACE) 100 MG capsule Take  100 mg by mouth at bedtime.    [provider]  finasteride (PROSCAR) 5 MG tablet Take 5 mg by mouth at bedtime.    [provider]  furosemide (LASIX) 40 MG tablet Take 40 mg by mouth every morning.    [provider]  gabapentin (NEURONTIN) 100 MG capsule Take 100 mg by mouth 3 (three) times daily.    [provider]  ibuprofen (ADVIL) 200 MG tablet Take 400 mg by mouth every 6 (six) hours as needed for moderate pain.    [provider]  labetalol (NORMODYNE) 300 MG tablet Take 100 mg by mouth 2 (two) times daily.    [provider]  losartan (COZAAR) 100 MG tablet Take 1 tablet (100 mg total) by mouth every evening. 09/22/20   Arnoldo Lenis, MD  metFORMIN (GLUCOPHAGE-XR) 500 MG 24 hr tablet Take 1,000 mg by mouth in the morning and at bedtime. 09/05/20   [provider]  Omega-3-6-9 CAPS Take 1 capsule by mouth 2 (two) times daily.    [provider]  oxyCODONE-acetaminophen (PERCOCET/ROXICET) 5-325 MG tablet Take 1 tablet by mouth every 6 (six) hours as needed for moderate pain. 05/12/22   Dagoberto Ligas, PA-C  polyethylene glycol powder (GLYCOLAX/MIRALAX) powder TAKE '17MG'$ (1CAPFUL) BY MOUTH(MIXED WITH WATER/JUICE) DAILY Patient taking  differently: Take 17 g by mouth at bedtime. 05/02/15   Lafayette Dragon, MD  rosuvastatin (CRESTOR) 10 MG tablet Take 10 mg by mouth every morning.     [provider]  tamsulosin (FLOMAX) 0.4 MG CAPS capsule Take 0.4 mg by mouth daily. 09/05/20   [provider]  Vitamin D, Ergocalciferol, (DRISDOL) 1.25 MG (50000 UNIT) CAPS capsule Take 50,000 Units by mouth every Thursday.    [provider]      Allergies    Dilaudid [hydromorphone hcl], Lyrica [pregabalin], Toradol [ketorolac tromethamine], and Tramadol    Review of Systems   Review of Systems  Physical Exam Updated Vital Signs BP (!) 147/85   Pulse 87   Temp 98 F (36.7 C) (Oral)   Resp (!) 22   Ht  '5\' 7"'$  (1.702 m)   Wt 82.1 kg   SpO2 93%   BMI 28.35 kg/m  Physical Exam  ED Results / Procedures / Treatments   Labs (all labs ordered are listed, but only abnormal results are displayed) Labs Reviewed  BASIC METABOLIC PANEL - Abnormal; Notable for the following components:      Result Value   Glucose, Bld 167 (*)    BUN 28 (*)    Calcium 8.7 (*)    All other components within normal limits  CBC - Abnormal; Notable for the following components:   RBC 2.22 (*)    Hemoglobin 7.0 (*)    HCT 22.4 (*)    MCV 100.9 (*)    RDW 17.6 (*)    All other components within normal limits  CBG MONITORING, ED - Abnormal; Notable for the following components:   Glucose-Capillary 191 (*)    All other components within normal limits  POC OCCULT BLOOD, ED - Abnormal  TYPE AND SCREEN  PREPARE RBC (CROSSMATCH)  TROPONIN I (HIGH SENSITIVITY)  TROPONIN I (HIGH SENSITIVITY)    EKG None  Radiology DG Chest 2 View  Result Date: 09/19/2022 CLINICAL DATA:  Chest pain shortness of breath for 1 week EXAM: CHEST - 2 VIEW COMPARISON:  09/03/2022 FINDINGS: Cardiac shadow is within normal limits. Lungs are well aerated bilaterally. No focal infiltrate or effusion is seen. Postsurgical changes in the lumbar spine are noted. IMPRESSION: No active cardiopulmonary disease. Electronically Signed   By: Inez Catalina M.D.   On: 09/19/2022 19:02   VAS US CAROTID  Result Date: 09/19/2022 Carotid Arterial Duplex Study Patient Name:  [name]  Date of Exam:   09/19/2022 Medical Rec #: [id number]       Accession #:    [id number] Date of Birth: 1945/10/16        Patient Gender: M Patient Age:   61 years Exam Location:  [facility] Procedure:      VAS US CAROTID Referring Phys: Servando Snare --------------------------------------------------------------------------------  Indications:       Carotid artery disease, Right stent and RT TCAR 05/11/22 by Dr                    Donzetta Matters. Risk Factors:       Hypertension, hyperlipidemia, Diabetes, current smoker,                    coronary artery disease, PAD. Other Factors:     Hx bladder and prostate cancer. Comparison Study:  06/13/22 Prior Carotid                    03/22/22 Prior  CTA neck Performing Technologist: Elta Guadeloupe RVT, RDMS  Examination Guidelines: A complete evaluation includes B-mode imaging, spectral Doppler, color Doppler, and power Doppler as needed of all accessible portions of each vessel. Bilateral testing is considered an integral part of a complete examination. Limited examinations for reoccurring indications may be performed as noted.  Right Carotid Findings: +----------+--------+--------+--------+----------------------+--------+           PSV cm/sEDV cm/sStenosisPlaque Description    Comments +----------+--------+--------+--------+----------------------+--------+ CCA Prox  100     22                                             +----------+--------+--------+--------+----------------------+--------+ CCA Mid   113     22                                             +----------+--------+--------+--------+----------------------+--------+ CCA Distal90      19      <50%    smooth and hyperechoic         +----------+--------+--------+--------+----------------------+--------+ ICA Prox  184     41      40-59%  calcific              stent    +----------+--------+--------+--------+----------------------+--------+ ICA Mid   201     47      40-59%  calcific              stent    +----------+--------+--------+--------+----------------------+--------+ ICA Distal115     28                                             +----------+--------+--------+--------+----------------------+--------+ ECA       88                                                     +----------+--------+--------+--------+----------------------+--------+ +----------+--------+-------+----------------+-------------------+           PSV  cm/sEDV cmsDescribe        Arm Pressure (mmHG) +----------+--------+-------+----------------+-------------------+ [id number]            Multiphasic, WNL                    +----------+--------+-------+----------------+-------------------+ +---------+--------+--+--------+--+---------+ VertebralPSV cm/s56EDV cm/s13Antegrade +---------+--------+--+--------+--+---------+  Left Carotid Findings: +----------+-------+--------+--------+-------------------------------+---------+           PSV    EDV cm/sStenosisPlaque Description             Comments            cm/s                                                            +----------+-------+--------+--------+-------------------------------+---------+ CCA Prox  92                                                              +----------+-------+--------+--------+-------------------------------+---------+  CCA Mid   79                     calcific                                 +----------+-------+--------+--------+-------------------------------+---------+ CCA Distal112    19      <50%    diffuse, heterogenous and      Shadowing                                  calcific                                 +----------+-------+--------+--------+-------------------------------+---------+ ICA Prox  320    77      60-79%  heterogenous and calcific      Shadowing +----------+-------+--------+--------+-------------------------------+---------+ ICA Mid   398    86      60-79%                                           +----------+-------+--------+--------+-------------------------------+---------+ ICA Distal177    34                                                       +----------+-------+--------+--------+-------------------------------+---------+ ECA       107                    diffuse and calcific                      +----------+-------+--------+--------+-------------------------------+---------+ +----------+--------+--------+----------------+-------------------+           PSV cm/sEDV cm/sDescribe        Arm Pressure (mmHG) +----------+--------+--------+----------------+-------------------+ [id number]             Multiphasic, WNL                    +----------+--------+--------+----------------+-------------------+ +---------+--------+--+--------+--+---------+ VertebralPSV cm/s62EDV cm/s19Antegrade +---------+--------+--+--------+--+---------+   Summary: Right Carotid: Velocities in the right ICA are consistent with a 40-59%                stenosis. Non-hemodynamically significant plaque <50% noted in                the CCA. Patent RT ICA stent. Left Carotid: Velocities in the left ICA are consistent with a 60-79% stenosis.               Non-hemodynamically significant plaque <50% noted in the CCA. Vertebrals:  Bilateral vertebral arteries demonstrate antegrade flow. Subclavians: Normal flow hemodynamics were seen in bilateral subclavian              arteries. *See table(s) above for measurements and observations.  Electronically signed by Servando Snare MD on 09/19/2022 at 11:35:13 AM.    Final     Procedures Procedures  {Document cardiac monitor, telemetry assessment procedure when appropriate:1}  Medications Ordered in ED Medications  pantoprazole (PROTONIX) injection 40 mg (has no administration in time range)  0.9 %  sodium chloride infusion (Manually program  via [place]) (has no administration in time range)    ED Course/ Medical Decision Making/ A&P                           Medical Decision Making Amount and/or Complexity of Data Reviewed Labs: ordered. Radiology: ordered.  Risk Prescription drug management.   ***  {Document critical care time when appropriate:1} {Document review of labs and clinical decision tools ie heart score, Chads2Vasc2 etc:1}  {Document  your independent review of radiology images, and any outside records:1} {Document your discussion with family members, caretakers, and with consultants:1} {Document social determinants of health affecting pt's care:1} {Document your decision making why or why not admission, treatments were needed:1} Final Clinical Impression(s) / ED Diagnoses Final diagnoses:  None    Rx / DC Orders ED Discharge Orders     None

## 2022-09-20 ENCOUNTER — Other Ambulatory Visit: Payer: Self-pay

## 2022-09-20 DIAGNOSIS — D649 Anemia, unspecified: Secondary | ICD-10-CM

## 2022-09-20 DIAGNOSIS — K922 Gastrointestinal hemorrhage, unspecified: Secondary | ICD-10-CM | POA: Diagnosis not present

## 2022-09-20 DIAGNOSIS — D62 Acute posthemorrhagic anemia: Secondary | ICD-10-CM | POA: Diagnosis not present

## 2022-09-20 DIAGNOSIS — R1310 Dysphagia, unspecified: Secondary | ICD-10-CM

## 2022-09-20 LAB — HEMOGLOBIN AND HEMATOCRIT, BLOOD
HCT: 25.4 % — ABNORMAL LOW (ref 39.0–52.0)
HCT: 26.5 % — ABNORMAL LOW (ref 39.0–52.0)
Hemoglobin: 8 g/dL — ABNORMAL LOW (ref 13.0–17.0)
Hemoglobin: 8.4 g/dL — ABNORMAL LOW (ref 13.0–17.0)

## 2022-09-20 LAB — COMPREHENSIVE METABOLIC PANEL
ALT: 18 U/L (ref 0–44)
AST: 17 U/L (ref 15–41)
Albumin: 3.4 g/dL — ABNORMAL LOW (ref 3.5–5.0)
Alkaline Phosphatase: 37 U/L — ABNORMAL LOW (ref 38–126)
Anion gap: 8 (ref 5–15)
BUN: 24 mg/dL — ABNORMAL HIGH (ref 8–23)
CO2: 25 mmol/L (ref 22–32)
Calcium: 8.5 mg/dL — ABNORMAL LOW (ref 8.9–10.3)
Chloride: 107 mmol/L (ref 98–111)
Creatinine, Ser: 1.07 mg/dL (ref 0.61–1.24)
GFR, Estimated: >60 mL/min (ref >60)
Glucose, Bld: 112 mg/dL — ABNORMAL HIGH (ref 70–99)
Potassium: 4 mmol/L (ref 3.5–5.1)
Sodium: 140 mmol/L (ref 135–145)
Total Bilirubin: 0.7 mg/dL (ref 0.3–1.2)
Total Protein: 6.3 g/dL — ABNORMAL LOW (ref 6.5–8.1)

## 2022-09-20 LAB — CBC WITH DIFFERENTIAL/PLATELET
Abs Immature Granulocytes: 0.05 10*3/uL (ref 0.00–0.07)
Basophils Absolute: 0.1 10*3/uL (ref 0.0–0.1)
Basophils Relative: 1 %
Eosinophils Absolute: 0.4 10*3/uL (ref 0.0–0.5)
Eosinophils Relative: 5 %
HCT: 24.8 % — ABNORMAL LOW (ref 39.0–52.0)
Hemoglobin: 8.1 g/dL — ABNORMAL LOW (ref 13.0–17.0)
Immature Granulocytes: 1 %
Lymphocytes Relative: 17 %
Lymphs Abs: 1.4 10*3/uL (ref 0.7–4.0)
MCH: 30.8 pg (ref 26.0–34.0)
MCHC: 32.7 g/dL (ref 30.0–36.0)
MCV: 94.3 fL (ref 80.0–100.0)
Monocytes Absolute: 0.8 10*3/uL (ref 0.1–1.0)
Monocytes Relative: 9 %
Neutro Abs: 5.8 10*3/uL (ref 1.7–7.7)
Neutrophils Relative %: 67 %
Platelets: 193 10*3/uL (ref 150–400)
RBC: 2.63 MIL/uL — ABNORMAL LOW (ref 4.22–5.81)
RDW: 21.2 % — ABNORMAL HIGH (ref 11.5–15.5)
WBC: 8.4 10*3/uL (ref 4.0–10.5)
nRBC: 0 % (ref 0.0–0.2)

## 2022-09-20 LAB — MAGNESIUM: Magnesium: 2.3 mg/dL (ref 1.7–2.4)

## 2022-09-20 LAB — CBG MONITORING, ED: Glucose-Capillary: 102 mg/dL — ABNORMAL HIGH (ref 70–99)

## 2022-09-20 LAB — GLUCOSE, CAPILLARY
Glucose-Capillary: 100 mg/dL — ABNORMAL HIGH (ref 70–99)
Glucose-Capillary: 101 mg/dL — ABNORMAL HIGH (ref 70–99)
Glucose-Capillary: 135 mg/dL — ABNORMAL HIGH (ref 70–99)
Glucose-Capillary: 202 mg/dL — ABNORMAL HIGH (ref 70–99)
Glucose-Capillary: 61 mg/dL — ABNORMAL LOW (ref 70–99)
Glucose-Capillary: 96 mg/dL (ref 70–99)

## 2022-09-20 MED ORDER — BISACODYL 5 MG PO TBEC
10.0000 mg | DELAYED_RELEASE_TABLET | Freq: Once | ORAL | Status: AC
  Administered 2022-09-20: 10 mg via ORAL
  Filled 2022-09-20: qty 2

## 2022-09-20 MED ORDER — TAMSULOSIN HCL 0.4 MG PO CAPS
0.4000 mg | ORAL_CAPSULE | Freq: Every day | ORAL | Status: DC
  Administered 2022-09-20 – 2022-09-21 (×2): 0.4 mg via ORAL
  Filled 2022-09-20 (×2): qty 1

## 2022-09-20 MED ORDER — LOSARTAN POTASSIUM 50 MG PO TABS
100.0000 mg | ORAL_TABLET | Freq: Every evening | ORAL | Status: DC
  Administered 2022-09-20: 100 mg via ORAL
  Filled 2022-09-20: qty 2

## 2022-09-20 MED ORDER — ONDANSETRON HCL 4 MG/2ML IJ SOLN: 4.0000 mg | Freq: Four times a day (QID) | INTRAMUSCULAR | Status: DC | PRN

## 2022-09-20 MED ORDER — ACETAMINOPHEN 650 MG RE SUPP: 650.0000 mg | Freq: Four times a day (QID) | RECTAL | Status: DC | PRN

## 2022-09-20 MED ORDER — ONDANSETRON HCL 4 MG PO TABS: 4.0000 mg | ORAL_TABLET | Freq: Four times a day (QID) | ORAL | Status: DC | PRN

## 2022-09-20 MED ORDER — OXYCODONE HCL 5 MG PO TABS: 5.0000 mg | ORAL_TABLET | ORAL | Status: DC | PRN

## 2022-09-20 MED ORDER — ACETAMINOPHEN 325 MG PO TABS
650.0000 mg | ORAL_TABLET | Freq: Four times a day (QID) | ORAL | Status: DC | PRN
  Administered 2022-09-21: 650 mg via ORAL
  Filled 2022-09-20: qty 2

## 2022-09-20 MED ORDER — CYANOCOBALAMIN 1000 MCG/ML IJ SOLN
1000.0000 ug | Freq: Every day | INTRAMUSCULAR | Status: DC
  Administered 2022-09-20 – 2022-09-21 (×2): 1000 ug via INTRAMUSCULAR
  Filled 2022-09-20 (×2): qty 1

## 2022-09-20 MED ORDER — NICOTINE 21 MG/24HR TD PT24
21.0000 mg | MEDICATED_PATCH | Freq: Every day | TRANSDERMAL | Status: DC
  Administered 2022-09-20 – 2022-09-21 (×2): 21 mg via TRANSDERMAL
  Filled 2022-09-20 (×2): qty 1

## 2022-09-20 MED ORDER — PANTOPRAZOLE SODIUM 40 MG IV SOLR
40.0000 mg | Freq: Two times a day (BID) | INTRAVENOUS | Status: DC
  Administered 2022-09-20 – 2022-09-21 (×3): 40 mg via INTRAVENOUS
  Filled 2022-09-20 (×3): qty 10

## 2022-09-20 MED ORDER — PEG 3350-KCL-NABCB-NACL-NASULF 236 G PO SOLR
4000.0000 mL | Freq: Once | ORAL | Status: DC
  Filled 2022-09-20: qty 4000

## 2022-09-20 MED ORDER — INSULIN ASPART 100 UNIT/ML IJ SOLN
0.0000 [IU] | INTRAMUSCULAR | Status: DC
  Administered 2022-09-20: 5 [IU] via SUBCUTANEOUS
  Administered 2022-09-20: 2 [IU] via SUBCUTANEOUS

## 2022-09-20 MED ORDER — GABAPENTIN 100 MG PO CAPS
100.0000 mg | ORAL_CAPSULE | Freq: Three times a day (TID) | ORAL | Status: DC
  Administered 2022-09-20 – 2022-09-21 (×4): 100 mg via ORAL
  Filled 2022-09-20 (×4): qty 1

## 2022-09-20 MED ORDER — AMLODIPINE BESYLATE 5 MG PO TABS
10.0000 mg | ORAL_TABLET | Freq: Every day | ORAL | Status: DC
  Administered 2022-09-20 – 2022-09-21 (×2): 10 mg via ORAL
  Filled 2022-09-20 (×2): qty 2

## 2022-09-20 MED ORDER — LABETALOL HCL 200 MG PO TABS
100.0000 mg | ORAL_TABLET | Freq: Two times a day (BID) | ORAL | Status: DC
  Administered 2022-09-20 – 2022-09-21 (×4): 100 mg via ORAL
  Filled 2022-09-20 (×4): qty 1

## 2022-09-20 MED ORDER — ROSUVASTATIN CALCIUM 10 MG PO TABS
10.0000 mg | ORAL_TABLET | Freq: Every morning | ORAL | Status: DC
  Administered 2022-09-20 – 2022-09-21 (×2): 10 mg via ORAL
  Filled 2022-09-20 (×2): qty 1

## 2022-09-20 MED ORDER — FINASTERIDE 5 MG PO TABS
5.0000 mg | ORAL_TABLET | Freq: Every day | ORAL | Status: DC
  Administered 2022-09-20 (×2): 5 mg via ORAL
  Filled 2022-09-20 (×2): qty 1

## 2022-09-20 MED ORDER — PEG 3350-KCL-NA BICARB-NACL 420 G PO SOLR
4000.0000 mL | Freq: Once | ORAL | Status: AC
  Administered 2022-09-20: 4000 mL via ORAL

## 2022-09-20 NOTE — H&P (Signed)
History and Physical    Patient: [name] BZJ:[id number] DOB: [birth year] DOA: 09/19/2022 DOS: the patient was seen and examined on 09/20/2022 PCP: [name], MD  Patient coming from: Home  Chief Complaint:  Chief Complaint  Patient presents with   Chest Pain   Shortness of Breath   Abnormal Lab   HPI: [name] is a 77 y.o. male with medical history significant of coronary artery disease, hyperlipidemia, hypertension, degenerative disc disease, osteoarthritis, OSA on CPAP, prostate cancer, type 2 diabetes mellitus, tobacco use disorder, and more presents the ED with a chief complaint of chest pain and dyspnea.  Patient reports she has had chest pain, dyspnea, dizziness for 1 week.  Dizziness he describes is consistent with orthostatic hypotension.  He reports when he goes from sitting to standing he feels like he might pass out.  He thinks this has been going on for a week, but he also reports maybe it has been going on for a month.  So he is not very clear on timeline.  Patient reports that he has been seeing different docs about it.  He went to his PCP and he was started on antibiotic and prednisone.  When that did not improve his dyspnea he went back was started on Mucinex.  Then they called him today and told him he needed to come into the ER because his blood count was low.  That called around 5:30 PM.  Patient reports she has had chest pain for the last 2 or 3 days.  It is exertional.  It is centrally located but vague.  Its dull and sharp.  With rest it is improved but not immediately relieved.  He has no associated diaphoresis, nausea, vomiting.  He does report associated near syncope.  For the last 2 weeks, family at bedside reports that his blood pressure has been volatile going both very high, and then dipping down to systolic blood pressure of 80.  Family at bedside also reports he is all 65 pounds in 1 year unintentionally.  He had chest x-ray done to screen for lung cancer per  family, but he has not followed up with a GI doc after having polyps removed previously.  Patient reports that he has had melena for an unknown amount of time.  He reports he did not think anything of it because he takes MiraLAX.  He notes for the last 3 or 4 days he has not had any melena.  He has not seen any overt blood in his stool, urine, from his nose, or from his gums.  He does have random bruises about his body and reports that that is from knocking into things.  Patient reports when he is having melena it was anywhere from 1-4 times per day.  On review of systems, patient complains of getting choked and gasping for air.  This has been happening more frequently.  Patient has no other complaints at this time.  Patient does smokes a pack per day.  He does not drink alcohol, use illicit drugs.  He is vaccinated for COVID.  Patient is DNR reporting that he would not want to be hooked up to any machines, and would rather have natural passing. Review of Systems: As mentioned in the history of present illness. All other systems reviewed and are negative. Past Medical History:  Diagnosis Date   Bilateral renal cysts    Bladder cancer ([place]) UROLOGIST-  DR [facility]   RECURRENT 11/ 2017 --  hx  turbt in 2004 and 08-19-2013   BPH without obstruction/lower urinary tract symptoms    Cervical fusion syndrome    LEFT ARM NUMBNESS--  CONTROLLED W/ GABAPENTIN   Coronary artery disease    CARDIOLOGIST-- DR Harl Bowie [name])   DDD (degenerative disc disease), cervical    DDD (degenerative disc disease), lumbar    Diverticulosis of sigmoid colon    Heart murmur    slight murmur per patient   History of acute gouty arthritis    History of adenomatous polyp of colon    2001;  2012- non-malignant leiomyoma;  04-07-2014  tubualr adenoma and hyperplastic polyp's   History of prostate cancer UROLOGIST-  DR [facility]   S/P  RADIOACTIVE SEED IMPLANTS 2004   HOH (hard of hearing)    no hearing aids    Hyperlipidemia    Hypertension    OA (osteoarthritis)    Occlusion and stenosis of carotid artery without mention of cerebral infarction CARDIOLOGIST  -- DR  [name]   LAST DUPLEX  11-23-2015  RICA  (DUPLEX DOPPLER 01-20-2016 RICA 44-31% PROXIMAL) /   LICA <54% (HAD CONSULT W/ [name] , VASCULAR SURGEON 01-20-2016)   OSA on CPAP    BiPAP   Prostate cancer ([place])    per patient "years ago"- had radiation implant   Spondylosis, cervical    Type 2 diabetes mellitus ([place])    Wears glasses    Past Surgical History:  Procedure Laterality Date   ANTERIOR REMOVAL CAGE AND PLATE C3-C7/ CORPECTOMY C7 (FX)/  ALLOGRAFT AND FUSION C3 -- T1/  POSTERIOR DECOMPRESSION BILATERAL LAMINECTOMY C4 -- 6 & PARTIAL C3/  POSTEROLATERAL ARTHRODESIS C3 - T1  06/05/2005   APPENDECTOMY  1990'S   CATARACT EXTRACTION W/ INTRAOCULAR LENS  IMPLANT, BILATERAL  2013   CERVICAL FUSION  05/11/2005   C3  --  C7/  due to post op quadriparesis same day s/p  anterior C 4,5,6, colpectomy, decompression, removal epidural hematoma, foraminnotomy, cage and plate   COLONOSCOPY  last one 04-07-2014   CYSTOSCOPY W/ RETROGRADES Bilateral 08/19/2013   Procedure: CYSTOSCOPY WITH RETROGRADE PYELOGRAM;  Surgeon: Molli Hazard, MD;  Location: [facility];  Service: Urology;  Laterality: Bilateral;   CYSTOSCOPY W/ RETROGRADES Bilateral 07/18/2016   Procedure: CYSTOSCOPY WITH RETROGRADE PYELOGRAM;  Surgeon: Alexis Frock, MD;  Location: [facility];  Service: Urology;  Laterality: Bilateral;   ENDOSCOPIC REPAIR CSF LEAK VIA NASAL PASSAGE  2011   INTRAOPERATIVE ARTERIOGRAM  CATH LAB  01-05-2009  DR Einar Gip   RICA   ACUTE ANGLE 80-85% STENOSIS   KNEE ARTHROSCOPY Left 07/15/2017   Procedure: ARTHROSCOPY LEFT  KNEE AND DEBRIDEMENT, medial meniscal tear and chondromalaita;  Surgeon: Paralee Cancel, MD;  Location: [facility];  Service: Orthopedics;  Laterality: Left;  [place]  03-26-2012   L3  --  L5   RADIOACTIVE PROSTATE SEED IMPLANTS  08-19-2003   SEPTOPLASTY  1998   TOTAL KNEE ARTHROPLASTY Left 02/25/2018   Procedure: LEFT TOTAL KNEE ARTHROPLASTY;  Surgeon: Paralee Cancel, MD;  Location: [facility];  Service: Orthopedics;  Laterality: Left;  70 mins   TRANSCAROTID ARTERY REVASCULARIZATION  Right 05/11/2022   Procedure: Right Transcarotid Artery Revascularization;  Surgeon: Waynetta Sandy, MD;  Location: [place];  Service: Vascular;  Laterality: Right;   TRANSURETHRAL RESECTION OF BLADDER TUMOR N/A 08/19/2013   Procedure: TRANSURETHRAL RESECTION OF BLADDER TUMOR (TURBT);  Surgeon: Molli Hazard, MD;  Location: [facility];  Service: Urology;  Laterality: N/A;   TRANSURETHRAL RESECTION OF BLADDER TUMOR N/A 07/18/2016   Procedure: TRANSURETHRAL RESECTION OF BLADDER TUMOR (TURBT);  Surgeon: Alexis Frock, MD;  Location: [facility];  Service: Urology;  Laterality: N/A;   YAG LASER APPLICATION  26/83/4196   Procedure: YAG LASER APPLICATION;  Surgeon: Elta Guadeloupe T. Gershon Crane, MD;  Location: [facility];  Service: Ophthalmology;  Laterality: Right;   Social History:  reports that he has been smoking cigarettes. He has a 47.00 pack-year smoking history. He has never used smokeless tobacco. He reports that he does not drink alcohol and does not use drugs.  Allergies  Allergen Reactions   Dilaudid [Hydromorphone Hcl] Other (See Comments)    "acts a little off"   Lyrica [Pregabalin] Nausea Only and Other (See Comments)    Make me feel "bad"   Toradol [Ketorolac Tromethamine] Other (See Comments)    CauseD  hallucination ?-states not sure/ avoids per his MD   Tramadol     Couldn't sleep    Family History  Problem Relation Age of Onset   Breast cancer Mother        mets   Diabetes Mother    Heart disease Father        MI   Colon cancer Brother    Cancer Sister        male organs   Diabetes Sister        family hx   Arthritis Other        entire  family   Diabetes Brother        x 2    Prior to Admission medications   Medication Sig Start Date End Date Taking? Authorizing Provider  acetaminophen (TYLENOL) 500 MG tablet Take 1,000 mg by mouth every 6 (six) hours as needed for moderate pain.    [provider]  allopurinol (ZYLOPRIM) 300 MG tablet Take 300 mg by mouth at bedtime.    [provider]  amLODipine (NORVASC) 10 MG tablet TAKE 1 TABLET BY MOUTH EVERY DAY 07/24/18   Arnoldo Lenis, MD  aspirin 81 MG EC tablet Take 81 mg by mouth at bedtime.    [provider]  celecoxib (CELEBREX) 200 MG capsule Take 200 mg by mouth 2 (two) times daily.     [provider]  clopidogrel (PLAVIX) 75 MG tablet Take 75 mg by mouth daily.  07/11/16   [provider]  colchicine 0.6 MG tablet Take 0.6 mg by mouth 2 (two) times daily as needed (gout).     [provider]  docusate sodium (COLACE) 100 MG capsule Take 100 mg by mouth at bedtime.    [provider]  finasteride (PROSCAR) 5 MG tablet Take 5 mg by mouth at bedtime.    [provider]  furosemide (LASIX) 40 MG tablet Take 40 mg by mouth every morning.    [provider]  gabapentin (NEURONTIN) 100 MG capsule Take 100 mg by mouth 3 (three) times daily.    [provider]  ibuprofen (ADVIL) 200 MG tablet Take 400 mg by mouth every 6 (six) hours as needed for moderate pain.    [provider]  labetalol (NORMODYNE) 300 MG tablet Take 100 mg by mouth 2 (two) times daily.    [provider]  losartan (COZAAR) 100 MG tablet Take 1 tablet (100 mg total) by mouth every evening. 09/22/20   Arnoldo Lenis, MD  metFORMIN (GLUCOPHAGE-XR) 500 MG 24 hr tablet Take  1,000 mg by mouth in the morning and at bedtime. 09/05/20   [provider]  Omega-3-6-9 CAPS Take 1 capsule by mouth 2 (two) times daily.    [provider]  oxyCODONE-acetaminophen (PERCOCET/ROXICET) 5-325 MG  tablet Take 1 tablet by mouth every 6 (six) hours as needed for moderate pain. 05/12/22   Dagoberto Ligas, PA-C  polyethylene glycol powder (GLYCOLAX/MIRALAX) powder TAKE '17MG'$ (1CAPFUL) BY MOUTH(MIXED WITH WATER/JUICE) DAILY Patient taking differently: Take 17 g by mouth at bedtime. 05/02/15   Lafayette Dragon, MD  rosuvastatin (CRESTOR) 10 MG tablet Take 10 mg by mouth every morning.     [provider]  tamsulosin (FLOMAX) 0.4 MG CAPS capsule Take 0.4 mg by mouth daily. 09/05/20   [provider]  Vitamin D, Ergocalciferol, (DRISDOL) 1.25 MG (50000 UNIT) CAPS capsule Take 50,000 Units by mouth every Thursday.    [provider]    Physical Exam: Vitals:   09/19/22 2330 09/20/22 0119 09/20/22 0204 09/20/22 0251  BP: 128/65 (!) 124/57 136/64 131/68  Pulse:  92 85 81  Resp:  '20 18 20  '$ Temp:  97.8 F (36.6 C) 97.9 F (36.6 C) 98.1 F (36.7 C)  TempSrc:  Oral  Oral  SpO2:  97% 96% 92%  Weight:   80.8 kg   Height:       1.  General: Patient lying supine in bed,  no acute distress   2. Psychiatric: Alert and oriented x 3, mood and behavior normal for situation, pleasant and cooperative with exam   3. Neurologic: Speech and language are normal, face is symmetric, moves all 4 extremities voluntarily, at baseline without acute deficits on limited exam   4. HEENMT:  Head is atraumatic, normocephalic, pupils reactive to light, neck is supple, trachea is midline, mucous membranes are moist   5. Respiratory : Lungs are clear to auscultation bilaterally without wheezing, rhonchi, rales, no cyanosis, no increase in work of breathing or accessory muscle use   6. Cardiovascular : Heart rate normal, rhythm is regular, murmur present, rubs or gallops, no peripheral edema, peripheral pulses palpated   7. Gastrointestinal:  Abdomen is soft, nondistended, nontender to palpation bowel sounds active, no masses or organomegaly palpated   8. Skin:  Skin is warm, dry and  intact without rashes, acute lesions, or ulcers on limited exam   9.Musculoskeletal:  No acute deformities or trauma, no asymmetry in tone, no peripheral edema, peripheral pulses palpated, no tenderness to palpation in the extremities  Data Reviewed: In the ED Temp 98, heart rate 79-96, respiratory rate 16-22, blood pressure 125/66-148/85, satting 93-98% No leukocytosis, hemoglobin low at 7.0.  Goal should be 8.0 and his coronary artery disease patient. Chemistry is unremarkable Chest x-ray shows no active cardiopulmonary disease EKG shows atrial fibrillation, QTc 435, heart rate 90 FOBT positive GI consulted in the ED and reportedly recommends clear liquid diet and they will see in the a.m.  Assessment and Plan: * Symptomatic anemia - Chest pain dizziness, shortness of breath - Hemoglobin down to 7.0 from 11.15 months ago - Heme positive on FOBT - Hold aspirin and Plavix - Hold pharmaceutical DVT prophylaxis - 1 unit of blood being transfused - Trend H&H in the a.m. - Continue to monitor  Dysphagia - Reports more frequent aspiration events - Speech therapy eval and treat  GI bleed - Heme positive on FOBT - GI consulted and recommends clear liquids, they plan to see patient in the a.m. and decide next steps - Continue  Protonix 40 mg IV twice daily - Continue to monitor  Tobacco abuse - Continues to smoke a pack per day - Nicotine patch ordered  DM type 2 (diabetes mellitus, type 2) (HCC) - Hold metformin - Sliding scale coverage  CAD (coronary artery disease) - Holding aspirin and Plavix - Continue labetalol, losartan, Crestor - Troponin 5, 5 - EKG shows atrial fibrillation, QTc 435, heart rate 90 - Monitor on telemetry      Advance Care Planning:   Code Status: DNR   Consults: GI  Family Communication: Discussed plan with family member at bedside who is also one of our ER nurses  Severity of Illness: The appropriate patient status for this patient is  INPATIENT. Inpatient status is judged to be reasonable and necessary in order to provide the required intensity of service to ensure the patient's safety. The patient's presenting symptoms, physical exam findings, and initial radiographic and laboratory data in the context of their chronic comorbidities is felt to place them at high risk for further clinical deterioration. Furthermore, it is not anticipated that the patient will be medically stable for discharge from the hospital within 2 midnights of admission.   * I certify that at the point of admission it is my clinical judgment that the patient will require inpatient hospital care spanning beyond 2 midnights from the point of admission due to high intensity of service, high risk for further deterioration and high frequency of surveillance required.*  Author: Rolla Plate, DO 09/20/2022 3:41 AM  For on call review [email or web address].

## 2022-09-20 NOTE — Assessment & Plan Note (Signed)
-   Reports more frequent aspiration events - Speech therapy eval and treat

## 2022-09-20 NOTE — Progress Notes (Signed)
  Transition of Care [facility]) Screening Note   Patient Details  Name: TYRI ELMORE Date of Birth: 07/24/1946   Transition [facility]) CM/SW Contact:    Iona Beard, [place] Phone Number: 09/20/2022, 10:13 AM    Transition of Care Department [facility]) has reviewed patient and no TOC needs have been identified at this time. We will continue to monitor patient advancement through interdisciplinary progression rounds. If new patient transition needs arise, please place a TOC consult.

## 2022-09-20 NOTE — Assessment & Plan Note (Signed)
-   Chest pain dizziness, shortness of breath - Hemoglobin down to 7.0 from 11.15 months ago - Heme positive on FOBT - Hold aspirin and Plavix - Hold pharmaceutical DVT prophylaxis - 1 unit of blood being transfused - Trend H&H in the a.m. - Continue to monitor

## 2022-09-20 NOTE — H&P (View-Only) (Signed)
Gastroenterology Consult   Referring Provider: No ref. provider found Primary Care Physician:  [name], MD Primary Gastroenterologist:  Dr. Delfin Edis  Patient ID: [name]; [id number]; January 04, 1946   Admit date: 09/19/2022  LOS: 1 day   Date of Consultation: 09/20/2022  Reason for Consultation:  anemia, GI bleed    History of Present Illness   [name] is a 77 y.o. male with history of recent carotid artery stent August 2023, CAD, bladder cancer, prostate cancer, diabetes, hypertension, colon polyps not removed in 2015, degenerative disc disease presents to the emergency department with complaints of chest pain, lightheaded, and shortness of breath for 1 week.  PCP completed labs called the patient and requested he come to the emergency department for abnormal blood work.  Patient feeling poorly for couple weeks, diagnosed with infection and put on antibiotics and prednisone.  Has had cough and shortness of breath.  He is on aspirin and Plavix for history of stent. Denies ibuprofen use. He has Celebrex on his med list. Reports black mushy stools for a while. Denies Pepto or iron use. Stools soft using stool softeners and Miralax. Over past few days, stool consistency somewhat better, semi-formed stools. Typically has only 1-2 stools daily. He contributed change in stool color to being on MiraLAX. No brbpr. No fresh blood per rectum. No abdominal pain, no heartburn, no dysphagia, no n/v. No prior EGD. He reports last colonoscopy in 2015 as outlined below. He reports nearly 60 pound weight loss in the past one year. He has not been trying to lose weight but reports that he did cut back on sugary drinks and bread.   In the ED: Stool Hemoccult positive.  Chest x-ray negative.  BUN 28, creatinine 1.23, blood cell count 7300, hemoglobin 7 down from 11.1 in August 2023, hematocrit 22.4, MCV 100.9, platelets 195,000, B12 156, iron 61, TIBC 329, iron sat 19%, ferritin 11.  Today:  Hemoglobin 8.1 after 1 unit of packed red blood cells, hematocrit 24.8   Colonoscopy July 2015: ENDOSCOPIC IMPRESSION: Two sessile polyps ranging between 3-21m in size were found at the cecum and in the rectum; polypectomy was performed with cold forceps of rectal polyp but the cecal polyp was not removed due to pt being on Plavix- biopsies obtained moderate to severe diverticulosis of the sigmoid colon Complete resolution of the inflammatory changes documented on CT scan of the abdomen -cecal polyp, tubular adenoma    Prior to Admission medications   Medication Sig Start Date End Date Taking? Authorizing Provider  acetaminophen (TYLENOL) 500 MG tablet Take 1,000 mg by mouth every 6 (six) hours as needed for moderate pain.    [provider]  allopurinol (ZYLOPRIM) 300 MG tablet Take 300 mg by mouth at bedtime.    [provider]  amLODipine (NORVASC) 10 MG tablet TAKE 1 TABLET BY MOUTH EVERY DAY 07/24/18   BArnoldo Lenis MD  aspirin 81 MG EC tablet Take 81 mg by mouth at bedtime.    [provider]  celecoxib (CELEBREX) 200 MG capsule Take 200 mg by mouth 2 (two) times daily.     [provider]  clopidogrel (PLAVIX) 75 MG tablet Take 75 mg by mouth daily.  07/11/16   [provider]  colchicine 0.6 MG tablet Take 0.6 mg by mouth 2 (two) times daily as needed (gout).     [provider]  docusate sodium (COLACE) 100 MG capsule Take 100 mg by mouth at  bedtime.    [provider]  finasteride (PROSCAR) 5 MG tablet Take 5 mg by mouth at bedtime.    [provider]  furosemide (LASIX) 40 MG tablet Take 40 mg by mouth every morning.    [provider]  gabapentin (NEURONTIN) 100 MG capsule Take 100 mg by mouth 3 (three) times daily.    [provider]  ibuprofen (ADVIL) 200 MG tablet Take 400 mg by mouth every 6 (six) hours as needed for moderate pain.    [provider]  labetalol  (NORMODYNE) 300 MG tablet Take 100 mg by mouth 2 (two) times daily.    [provider]  losartan (COZAAR) 100 MG tablet Take 1 tablet (100 mg total) by mouth every evening. 09/22/20   Arnoldo Lenis, MD  metFORMIN (GLUCOPHAGE-XR) 500 MG 24 hr tablet Take 1,000 mg by mouth in the morning and at bedtime. 09/05/20   [provider]  Omega-3-6-9 CAPS Take 1 capsule by mouth 2 (two) times daily.    [provider]  oxyCODONE-acetaminophen (PERCOCET/ROXICET) 5-325 MG tablet Take 1 tablet by mouth every 6 (six) hours as needed for moderate pain. 05/12/22   Dagoberto Ligas, PA-C  polyethylene glycol powder (GLYCOLAX/MIRALAX) powder TAKE '17MG'$ (1CAPFUL) BY MOUTH(MIXED WITH WATER/JUICE) DAILY Patient taking differently: Take 17 g by mouth at bedtime. 05/02/15   Lafayette Dragon, MD  rosuvastatin (CRESTOR) 10 MG tablet Take 10 mg by mouth every morning.     [provider]  tamsulosin (FLOMAX) 0.4 MG CAPS capsule Take 0.4 mg by mouth daily. 09/05/20   [provider]  Vitamin D, Ergocalciferol, (DRISDOL) 1.25 MG (50000 UNIT) CAPS capsule Take 50,000 Units by mouth every Thursday.    [provider]    Current Facility-Administered Medications  Medication Dose Route Frequency Provider Last Rate Last Admin   acetaminophen (TYLENOL) tablet 650 mg  650 mg Oral Q6H PRN Zierle-Ghosh, Asia B, DO       Or   acetaminophen (TYLENOL) suppository 650 mg  650 mg Rectal Q6H PRN Zierle-Ghosh, Asia B, DO       amLODipine (NORVASC) tablet 10 mg  10 mg Oral Daily Zierle-Ghosh, Asia B, DO       finasteride (PROSCAR) tablet 5 mg  5 mg Oral QHS Zierle-Ghosh, Asia B, DO   5 mg at 09/20/22 0119   gabapentin (NEURONTIN) capsule 100 mg  100 mg Oral TID Zierle-Ghosh, Asia B, DO       insulin aspart (novoLOG) injection 0-15 Units  0-15 Units Subcutaneous Q4H Zierle-Ghosh, Asia B, DO       labetalol (NORMODYNE) tablet 100 mg  100 mg Oral BID Zierle-Ghosh, Asia B, DO   100 mg at  09/20/22 0119   losartan (COZAAR) tablet 100 mg  100 mg Oral QPM Zierle-Ghosh, Asia B, DO       nicotine (NICODERM CQ - dosed in mg/24 hours) patch 21 mg  21 mg Transdermal Daily Zierle-Ghosh, Asia B, DO       ondansetron (ZOFRAN) tablet 4 mg  4 mg Oral Q6H PRN Zierle-Ghosh, Asia B, DO       Or   ondansetron (ZOFRAN) injection 4 mg  4 mg Intravenous Q6H PRN Zierle-Ghosh, Asia B, DO       oxyCODONE (Oxy IR/ROXICODONE) immediate release tablet 5 mg  5 mg Oral Q4H PRN Zierle-Ghosh, Asia B, DO       pantoprazole (PROTONIX) injection 40 mg  40 mg Intravenous Q12H Zierle-Ghosh, Asia B, DO  rosuvastatin (CRESTOR) tablet 10 mg  10 mg Oral q morning Zierle-Ghosh, Asia B, DO       tamsulosin (FLOMAX) capsule 0.4 mg  0.4 mg Oral Daily Zierle-Ghosh, Asia B, DO        Allergies as of 09/19/2022 - Review Complete 09/19/2022  Allergen Reaction Noted   Dilaudid [hydromorphone hcl] Other (See Comments) 04/06/2014   Lyrica [pregabalin] Nausea Only and Other (See Comments)    Toradol [ketorolac tromethamine] Other (See Comments) 02/04/2014   Tramadol  05/02/2022    Past Medical History:  Diagnosis Date   Bilateral renal cysts    Bladder cancer ([place]) UROLOGIST-  DR [facility]   RECURRENT 11/ 2017 --  hx turbt in 2004 and 08-19-2013   BPH without obstruction/lower urinary tract symptoms    Cervical fusion syndrome    LEFT ARM NUMBNESS--  CONTROLLED W/ GABAPENTIN   Coronary artery disease    CARDIOLOGIST-- DR Harl Bowie [name])   DDD (degenerative disc disease), cervical    DDD (degenerative disc disease), lumbar    Diverticulosis of sigmoid colon    Heart murmur    slight murmur per patient   History of acute gouty arthritis    History of adenomatous polyp of colon    2001;  2012- non-malignant leiomyoma;  04-07-2014  tubualr adenoma and hyperplastic polyp's   History of prostate cancer UROLOGIST-  DR [facility]   S/P  RADIOACTIVE SEED IMPLANTS 2004   HOH (hard of hearing)    no hearing aids    Hyperlipidemia    Hypertension    OA (osteoarthritis)    Occlusion and stenosis of carotid artery without mention of cerebral infarction CARDIOLOGIST  -- DR  [name]   LAST DUPLEX  11-23-2015  RICA  (DUPLEX DOPPLER 01-20-2016 RICA 43-32% PROXIMAL) /   LICA <95% (HAD CONSULT W/ [name] , VASCULAR SURGEON 01-20-2016)   OSA on CPAP    BiPAP   Prostate cancer ([place])    per patient "years ago"- had radiation implant   Spondylosis, cervical    Type 2 diabetes mellitus ([place])    Wears glasses     Past Surgical History:  Procedure Laterality Date   ANTERIOR REMOVAL CAGE AND PLATE C3-C7/ CORPECTOMY C7 (FX)/  ALLOGRAFT AND FUSION C3 -- T1/  POSTERIOR DECOMPRESSION BILATERAL LAMINECTOMY C4 -- 6 & PARTIAL C3/  POSTEROLATERAL ARTHRODESIS C3 - T1  06/05/2005   APPENDECTOMY  1990'S   CATARACT EXTRACTION W/ INTRAOCULAR LENS  IMPLANT, BILATERAL  2013   CERVICAL FUSION  05/11/2005   C3  --  C7/  due to post op quadriparesis same day s/p  anterior C 4,5,6, colpectomy, decompression, removal epidural hematoma, foraminnotomy, cage and plate   COLONOSCOPY  last one 04-07-2014   CYSTOSCOPY W/ RETROGRADES Bilateral 08/19/2013   Procedure: CYSTOSCOPY WITH RETROGRADE PYELOGRAM;  Surgeon: Molli Hazard, MD;  Location: [facility];  Service: Urology;  Laterality: Bilateral;   CYSTOSCOPY W/ RETROGRADES Bilateral 07/18/2016   Procedure: CYSTOSCOPY WITH RETROGRADE PYELOGRAM;  Surgeon: Alexis Frock, MD;  Location: [facility];  Service: Urology;  Laterality: Bilateral;   ENDOSCOPIC REPAIR CSF LEAK VIA NASAL PASSAGE  2011   INTRAOPERATIVE ARTERIOGRAM  CATH LAB  01-05-2009  DR Einar Gip   RICA   ACUTE ANGLE 80-85% STENOSIS   KNEE ARTHROSCOPY Left 07/15/2017   Procedure: ARTHROSCOPY LEFT  KNEE AND DEBRIDEMENT, medial meniscal tear and chondromalaita;  Surgeon: Paralee Cancel, MD;  Location: [facility];  Service: Orthopedics;  Laterality: Left;  [place]  03-26-2012    L3 --  L5   RADIOACTIVE PROSTATE SEED IMPLANTS  08-19-2003   SEPTOPLASTY  1998   TOTAL KNEE ARTHROPLASTY Left 02/25/2018   Procedure: LEFT TOTAL KNEE ARTHROPLASTY;  Surgeon: Paralee Cancel, MD;  Location: [facility];  Service: Orthopedics;  Laterality: Left;  70 mins   TRANSCAROTID ARTERY REVASCULARIZATION  Right 05/11/2022   Procedure: Right Transcarotid Artery Revascularization;  Surgeon: Waynetta Sandy, MD;  Location: [facility];  Service: Vascular;  Laterality: Right;   TRANSURETHRAL RESECTION OF BLADDER TUMOR N/A 08/19/2013   Procedure: TRANSURETHRAL RESECTION OF BLADDER TUMOR (TURBT);  Surgeon: Molli Hazard, MD;  Location[facility];  Service: Urology;  Laterality: N/A;   TRANSURETHRAL RESECTION OF BLADDER TUMOR N/A 07/18/2016   Procedure: TRANSURETHRAL RESECTION OF BLADDER TUMOR (TURBT);  Surgeon: Alexis Frock, MD;  Location: [facility];  Service: Urology;  Laterality: N/A;   YAG LASER APPLICATION  03/16/1600   Procedure: YAG LASER APPLICATION;  Surgeon: Elta Guadeloupe T. Gershon Crane, MD;  Location: [facility];  Service: Ophthalmology;  Laterality: Right;    Family History  Problem Relation Age of Onset   Breast cancer Mother        mets   Diabetes Mother    Heart disease Father        MI   Colon cancer Brother    Cancer Sister        male organs   Diabetes Sister        family hx   Arthritis Other        entire family   Diabetes Brother        x 2    Social History   Socioeconomic History   Marital status: Married    Spouse name: Not on file   Number of children: 2   Years of education: Not on file   Highest education level: Not on file  Occupational History   Occupation: retired//disabled  Tobacco Use   Smoking status: Every Day    Packs/day: 1.00    Years: 47.00    Total pack years: 47.00    Types: Cigarettes   Smokeless tobacco: Never   Tobacco comments:    pt quits smoking periodically :: started smoking again Jan 2018 1ppd   Vaping Use   Vaping Use: Never used  Substance and Sexual Activity   Alcohol use: No    Alcohol/week: 0.0 standard drinks of alcohol   Drug use: No   Sexual activity: Not on file  Other Topics Concern   Not on file  Social History Narrative   Not on file   Social Determinants of Health   Financial Resource Strain: Not on file  Food Insecurity: Not on file  Transportation Needs: Not on file  Physical Activity: Not on file  Stress: Not on file  Social Connections: Not on file  Intimate Partner Violence: Not on file     Review of System:   General: Negative for anorexia,  fever, chills, fatigue, +weakness. See hpi Eyes: Negative for vision changes.  ENT: Negative for hoarseness, difficulty swallowing , nasal congestion. CV: Negative for chest pain, angina, palpitations, dyspnea on exertion, peripheral edema.  Respiratory: Negative for dyspnea at rest, dyspnea on exertion. +cough, + yellow sputum GI: See history of present illness. GU:  Negative for dysuria, hematuria, urinary incontinence, urinary frequency, nocturnal urination.  MS: Negative for joint pain, low back pain.  Derm: Negative for rash or itching.  Neuro: Negative for weakness, abnormal sensation, seizure, frequent headaches, memory loss, confusion.  Psych: Negative for anxiety, depression, suicidal ideation, hallucinations.  Endo: Negative for unusual weight change.  Heme: Negative for bruising or bleeding. Allergy: Negative for rash or hives.      Physical Examination:   Vital signs in last 24 hours: Temp:  [97.8 F (36.6 C)-98.2 F (36.8 C)] 98 F (36.7 C) (01/04 0453) Pulse Rate:  [78-96] 78 (01/04 0453) Resp:  [16-22] 20 (01/04 0453) BP: (124-148)/(57-85) 146/74 (01/04 0453) SpO2:  [92 %-98 %] 94 % (01/04 0453) Weight:  [80.8 kg-82.1 kg] 80.8 kg (01/04 0453) Last BM Date : 09/19/22  General: Well-nourished, well-developed in no acute distress.  Head: Normocephalic, atraumatic.   Eyes:  Conjunctiva pale, no icterus. Mouth: Oropharyngeal mucosa moist and pink, no lesions erythema or exudate. Neck: Supple without thyromegaly, masses, or lymphadenopathy.  Lungs: Clear to auscultation bilaterally.  Heart: Regular rate and rhythm, no rubs or gallops. 3/6 SEM  Abdomen: Bowel sounds are normal, nontender, nondistended, no hepatosplenomegaly or masses, no abdominal bruits or hernia , no rebound or guarding.   Rectal: not performed Extremities:no lower extremity edema, clubbing, deformity.  Neuro: Alert and oriented x 4 , grossly normal neurologically.  Skin: Warm and dry, no rash or jaundice.   Psych: Alert and cooperative, normal mood and affect.        Intake/Output from previous day: 01/03 0701 - 01/04 0700 In: 531.8 [P.O.:240; I.V.:5.8; Blood:286] Out: 700 [Urine:700] Intake/Output this shift: No intake/output data recorded.  Lab Results:   CBC Recent Labs    09/19/22 1841 09/20/22 0655  WBC 7.3 8.4  HGB 7.0* 8.1*  HCT 22.4* 24.8*  MCV 100.9* 94.3  PLT 195 193   BMET Recent Labs    09/19/22 1841 09/20/22 0655  NA 140 140  K 3.9 4.0  CL 104 107  CO2 27 25  GLUCOSE 167* 112*  BUN 28* 24*  CREATININE 1.23 1.07  CALCIUM 8.7* 8.5*   LFT Recent Labs    09/20/22 0655  BILITOT 0.7  ALKPHOS 37*  AST 17  ALT 18  PROT 6.3*  ALBUMIN 3.4*    Lipase No results for input(s): "LIPASE" in the last 72 hours.  PT/INR No results for input(s): "LABPROT", "INR" in the last 72 hours.   Hepatitis Panel No results for input(s): "HEPBSAG", "HCVAB", "HEPAIGM", "HEPBIGM" in the last 72 hours.   Imaging Studies:   DG Chest 2 View  Result Date: 09/19/2022 CLINICAL DATA:  Chest pain shortness of breath for 1 week EXAM: CHEST - 2 VIEW COMPARISON:  09/03/2022 FINDINGS: Cardiac shadow is within normal limits. Lungs are well aerated bilaterally. No focal infiltrate or effusion is seen. Postsurgical changes in the lumbar spine are noted. IMPRESSION: No active  cardiopulmonary disease. Electronically Signed   By: Inez Catalina M.D.   On: 09/19/2022 19:02   VAS US CAROTID  Result Date: 09/19/2022 Carotid Arterial Duplex Study Patient Name:  MELDON HANZLIK  Date of Exam:   09/19/2022 Medical Rec #: [id number]       Accession #:    [id number] Date of Birth: May 28, 1946        Patient Gender: M Patient Age:   60 years Exam Location:  [facility] Procedure:      VAS US CAROTID Referring Phys: Servando Snare --------------------------------------------------------------------------------  Indications:       Carotid artery disease, Right stent and RT TCAR 05/11/22 by Dr  Donzetta Matters. Risk Factors:      Hypertension, hyperlipidemia, Diabetes, current smoker,                    coronary artery disease, PAD. Other Factors:     Hx bladder and prostate cancer. Comparison Study:  06/13/22 Prior Carotid                    03/22/22 Prior CTA neck Performing Technologist: Elta Guadeloupe RVT, RDMS  Examination Guidelines: A complete evaluation includes B-mode imaging, spectral Doppler, color Doppler, and power Doppler as needed of all accessible portions of each vessel. Bilateral testing is considered an integral part of a complete examination. Limited examinations for reoccurring indications may be performed as noted.  Right Carotid Findings: +----------+--------+--------+--------+----------------------+--------+           PSV cm/sEDV cm/sStenosisPlaque Description    Comments +----------+--------+--------+--------+----------------------+--------+ CCA Prox  100     22                                             +----------+--------+--------+--------+----------------------+--------+ CCA Mid   113     22                                             +----------+--------+--------+--------+----------------------+--------+ CCA Distal90      19      <50%    smooth and hyperechoic          +----------+--------+--------+--------+----------------------+--------+ ICA Prox  184     41      40-59%  calcific              stent    +----------+--------+--------+--------+----------------------+--------+ ICA Mid   201     47      40-59%  calcific              stent    +----------+--------+--------+--------+----------------------+--------+ ICA Distal115     28                                             +----------+--------+--------+--------+----------------------+--------+ ECA       88                                                     +----------+--------+--------+--------+----------------------+--------+ +----------+--------+-------+----------------+-------------------+           PSV cm/sEDV cmsDescribe        Arm Pressure (mmHG) +----------+--------+-------+----------------+-------------------+ [id number]            Multiphasic, WNL                    +----------+--------+-------+----------------+-------------------+ +---------+--------+--+--------+--+---------+ VertebralPSV cm/s56EDV cm/s13Antegrade +---------+--------+--+--------+--+---------+  Left Carotid Findings: +----------+-------+--------+--------+-------------------------------+---------+           PSV    EDV cm/sStenosisPlaque Description             Comments            cm/s                                                            +----------+-------+--------+--------+-------------------------------+---------+  CCA Prox  92                                                              +----------+-------+--------+--------+-------------------------------+---------+ CCA Mid   79                     calcific                                 +----------+-------+--------+--------+-------------------------------+---------+ CCA Distal112    19      <50%    diffuse, heterogenous and      Shadowing                                  calcific                                  +----------+-------+--------+--------+-------------------------------+---------+ ICA Prox  320    77      60-79%  heterogenous and calcific      Shadowing +----------+-------+--------+--------+-------------------------------+---------+ ICA Mid   398    86      60-79%                                           +----------+-------+--------+--------+-------------------------------+---------+ ICA Distal177    34                                                       +----------+-------+--------+--------+-------------------------------+---------+ ECA       107                    diffuse and calcific                     +----------+-------+--------+--------+-------------------------------+---------+ +----------+--------+--------+----------------+-------------------+           PSV cm/sEDV cm/sDescribe        Arm Pressure (mmHG) +----------+--------+--------+----------------+-------------------+ [id number]             Multiphasic, WNL                    +----------+--------+--------+----------------+-------------------+ +---------+--------+--+--------+--+---------+ VertebralPSV cm/s62EDV cm/s19Antegrade +---------+--------+--+--------+--+---------+   Summary: Right Carotid: Velocities in the right ICA are consistent with a 40-59%                stenosis. Non-hemodynamically significant plaque <50% noted in                the CCA. Patent RT ICA stent. Left Carotid: Velocities in the left ICA are consistent with a 60-79% stenosis.               Non-hemodynamically significant plaque <50% noted in the CCA. Vertebrals:  Bilateral vertebral arteries demonstrate antegrade flow. Subclavians: Normal flow hemodynamics were seen in bilateral subclavian  arteries. *See table(s) above for measurements and observations.  Electronically signed by Servando Snare MD on 09/19/2022 at 11:35:13 AM.    Final    DG Chest 2 View  Result Date: 09/03/2022 CLINICAL DATA:  Congestion,  shortness of breath, cough, weakness EXAM: CHEST - 2 VIEW COMPARISON:  Chest radiograph 02/03/2017, CT chest 07/03/2017 FINDINGS: The cardiomediastinal silhouette is normal. There is no focal consolidation or pulmonary edema. There is no pleural effusion or pneumothorax There is no acute osseous abnormality. Lumbar spine fusion hardware is partially imaged. IMPRESSION: No radiographic evidence of acute cardiopulmonary process. Electronically Signed   By: Valetta Mole M.D.   On: 09/03/2022 14:50  [4 week]  Assessment:   77 y/o male with multiple comorbidities including recent carotid artery stent placement in August 2023 on aspirin and Plavix, CAD, bladder cancer, prostate cancer, diabetes, hypertension, tubular adenoma of the cecum not removed in 2015, degenerative disc disease presenting to the ED with complaints of cough, shortness of breath, lightheadedness, melena.  Acute on chronic anemia/GI bleed: Reports melena for several weeks.  Stool heme positive.  On Plavix and aspirin for history of stent placement in August.  Celebrex noted on medication list, patient denies ibuprofen use.  Patient has a history of tubular adenoma which was not removed from the cecum in 2015, per report because he was on Plavix at the time.  Presenting now with significant change in hemoglobin.  Ferritin of 11.  Also with B12 deficiency.  Given black stools, would be concerned about upper GI source.  However with known history of tubular adenoma left behind in 2015, he needs to have a colonoscopy.  Will plan for colonoscopy and upper endoscopy tomorrow, patient agreeable.  Plan:   Monitor H&H, transfuse as needed. Replete B12. Consider resuming Plavix given recent stent placement. Agree with IV PPI twice daily. Clear liquids today, n.p.o. after midnight. Colonoscopy/EGD tomorrow.  I have discussed the risks, alternatives, benefits with regards to but not limited to the risk of reaction to medication, bleeding, infection,  perforation and the patient is agreeable to proceed. Written consent to be obtained.    LOS: 1 day   We would like to thank you for the opportunity to participate in the care of Samin R Yoon.  Laureen Ochs. Bernarda Caffey [facility] [phone] 1/4/20248:00 AM

## 2022-09-20 NOTE — Assessment & Plan Note (Signed)
-   Continues to smoke a pack per day - Nicotine patch ordered

## 2022-09-20 NOTE — Assessment & Plan Note (Signed)
-   Holding aspirin and Plavix - Continue labetalol, losartan, Crestor - Troponin 5, 5 - EKG shows atrial fibrillation, QTc 435, heart rate 90 - Monitor on telemetry

## 2022-09-20 NOTE — Care Plan (Signed)
This 77 years old male with PMH significant for coronary artery disease, hyperlipidemia, hypertension, degenerative disc disease, osteoarthritis, OSA on CPAP, prostate cancer, type 2 diabetes mellitus, tobacco use disorder, presented in the ED with complaints of chest tightness , dizziness and shortness of breath on exertion.  He also reports near syncopal episodes.  He is found to have a hemoglobin 7.0 which is significant drop from 11.0 few months back.  He is also found to have occult+ stools, patient has received 1 unit of packed red blood cells.  He reports improvement in his symptoms of chest tightness dizziness and shortness of breath on exertion.  GI is consulted patient is a scheduled to have EGD and colonoscopy tomorrow.  Patient was seen and examined at bedside.  Overnight events noted.  Patient reports improvement in symptoms.  Assessment and plan: Symptomatic anemia sec. to GI bleeding: Patient presented with chest tightness, dizziness, presyncopal episodes and shortness of breath on exertion.  Hemoglobin down to 7.0 from 11.0,  15 months ago.  Stool for occult blood positive. Hold aspirin and Plavix. Hold pharmaceutical DVT prophylaxis. S/p 1 unit PRBC,  hemoglobin improved to 8.1. Continue to trend H&H q 12hr GI is consulted,  Patient is scheduled for EGD and colonoscopy tomorrow. Continue Protonix 40 mg IV every 12 hours. N.p.o. midnight.  Dysphagia: Patient reports more frequent aspiration events. Obtain speech and swallow evaluation.  Tobacco abuse: Patient continues to smoke 1 pack/day Nicotine patch  DM type II: Hold metformin. Continue regular insulin sliding scale. obtain hemoglobin A1c. Carb modified diet  Coronary artery disease: Hold aspirin and Plavix in the setting of GI bleed. Continue labetalol, losartan and Crestor. Troponin 5> 5 not significant. EKG showed atrial fibrillation,  heart rate well controlled. Continue telemetry

## 2022-09-20 NOTE — Consult Note (Addendum)
Gastroenterology Consult   Referring Provider: No ref. provider found Primary Care Physician:  [name], MD Primary Gastroenterologist:  Dr. Delfin Edis  Patient ID: [name]; [id number]; [birth year]   Admit date: 09/19/2022  LOS: 1 day   Date of Consultation: 09/20/2022  Reason for Consultation:  anemia, GI bleed    History of Present Illness   [name] is a 77 y.o. male with history of recent carotid artery stent August 2023, CAD, bladder cancer, prostate cancer, diabetes, hypertension, colon polyps not removed in 2015, degenerative disc disease presents to the emergency department with complaints of chest pain, lightheaded, and shortness of breath for 1 week.  PCP completed labs called the patient and requested he come to the emergency department for abnormal blood work.  Patient feeling poorly for couple weeks, diagnosed with infection and put on antibiotics and prednisone.  Has had cough and shortness of breath.  He is on aspirin and Plavix for history of stent. Denies ibuprofen use. He has Celebrex on his med list. Reports black mushy stools for a while. Denies Pepto or iron use. Stools soft using stool softeners and Miralax. Over past few days, stool consistency somewhat better, semi-formed stools. Typically has only 1-2 stools daily. He contributed change in stool color to being on MiraLAX. No brbpr. No fresh blood per rectum. No abdominal pain, no heartburn, no dysphagia, no n/v. No prior EGD. He reports last colonoscopy in 2015 as outlined below. He reports nearly 60 pound weight loss in the past one year. He has not been trying to lose weight but reports that he did cut back on sugary drinks and bread.   In the ED: Stool Hemoccult positive.  Chest x-ray negative.  BUN 28, creatinine 1.23, blood cell count 7300, hemoglobin 7 down from 11.1 in August 2023, hematocrit 22.4, MCV 100.9, platelets 195,000, B12 156, iron 61, TIBC 329, iron sat 19%, ferritin 11.  Today:  Hemoglobin 8.1 after 1 unit of packed red blood cells, hematocrit 24.8   Colonoscopy July 2015: ENDOSCOPIC IMPRESSION: Two sessile polyps ranging between 3-79m in size were found at the cecum and in the rectum; polypectomy was performed with cold forceps of rectal polyp but the cecal polyp was not removed due to pt being on Plavix- biopsies obtained moderate to severe diverticulosis of the sigmoid colon Complete resolution of the inflammatory changes documented on CT scan of the abdomen -cecal polyp, tubular adenoma    Prior to Admission medications   Medication Sig Start Date End Date Taking? Authorizing Provider  acetaminophen (TYLENOL) 500 MG tablet Take 1,000 mg by mouth every 6 (six) hours as needed for moderate pain.    [provider]  allopurinol (ZYLOPRIM) 300 MG tablet Take 300 mg by mouth at bedtime.    [provider]  amLODipine (NORVASC) 10 MG tablet TAKE 1 TABLET BY MOUTH EVERY DAY 07/24/18   BArnoldo Lenis MD  aspirin 81 MG EC tablet Take 81 mg by mouth at bedtime.    [provider]  celecoxib (CELEBREX) 200 MG capsule Take 200 mg by mouth 2 (two) times daily.     [provider]  clopidogrel (PLAVIX) 75 MG tablet Take 75 mg by mouth daily.  07/11/16   [provider]  colchicine 0.6 MG tablet Take 0.6 mg by mouth 2 (two) times daily as needed (gout).     [provider]  docusate sodium (COLACE) 100 MG capsule Take 100 mg by mouth at  bedtime.    [provider]  finasteride (PROSCAR) 5 MG tablet Take 5 mg by mouth at bedtime.    [provider]  furosemide (LASIX) 40 MG tablet Take 40 mg by mouth every morning.    [provider]  gabapentin (NEURONTIN) 100 MG capsule Take 100 mg by mouth 3 (three) times daily.    [provider]  ibuprofen (ADVIL) 200 MG tablet Take 400 mg by mouth every 6 (six) hours as needed for moderate pain.    [provider]  labetalol  (NORMODYNE) 300 MG tablet Take 100 mg by mouth 2 (two) times daily.    [provider]  losartan (COZAAR) 100 MG tablet Take 1 tablet (100 mg total) by mouth every evening. 09/22/20   Arnoldo Lenis, MD  metFORMIN (GLUCOPHAGE-XR) 500 MG 24 hr tablet Take 1,000 mg by mouth in the morning and at bedtime. 09/05/20   [provider]  Omega-3-6-9 CAPS Take 1 capsule by mouth 2 (two) times daily.    [provider]  oxyCODONE-acetaminophen (PERCOCET/ROXICET) 5-325 MG tablet Take 1 tablet by mouth every 6 (six) hours as needed for moderate pain. 05/12/22   Dagoberto Ligas, PA-C  polyethylene glycol powder (GLYCOLAX/MIRALAX) powder TAKE '17MG'$ (1CAPFUL) BY MOUTH(MIXED WITH WATER/JUICE) DAILY Patient taking differently: Take 17 g by mouth at bedtime. 05/02/15   Lafayette Dragon, MD  rosuvastatin (CRESTOR) 10 MG tablet Take 10 mg by mouth every morning.     [provider]  tamsulosin (FLOMAX) 0.4 MG CAPS capsule Take 0.4 mg by mouth daily. 09/05/20   [provider]  Vitamin D, Ergocalciferol, (DRISDOL) 1.25 MG (50000 UNIT) CAPS capsule Take 50,000 Units by mouth every Thursday.    [provider]    Current Facility-Administered Medications  Medication Dose Route Frequency Provider Last Rate Last Admin   acetaminophen (TYLENOL) tablet 650 mg  650 mg Oral Q6H PRN Zierle-Ghosh, Asia B, DO       Or   acetaminophen (TYLENOL) suppository 650 mg  650 mg Rectal Q6H PRN Zierle-Ghosh, Asia B, DO       amLODipine (NORVASC) tablet 10 mg  10 mg Oral Daily Zierle-Ghosh, Asia B, DO       finasteride (PROSCAR) tablet 5 mg  5 mg Oral QHS Zierle-Ghosh, Asia B, DO   5 mg at 09/20/22 0119   gabapentin (NEURONTIN) capsule 100 mg  100 mg Oral TID Zierle-Ghosh, Asia B, DO       insulin aspart (novoLOG) injection 0-15 Units  0-15 Units Subcutaneous Q4H Zierle-Ghosh, Asia B, DO       labetalol (NORMODYNE) tablet 100 mg  100 mg Oral BID Zierle-Ghosh, Asia B, DO   100 mg at  09/20/22 0119   losartan (COZAAR) tablet 100 mg  100 mg Oral QPM Zierle-Ghosh, Asia B, DO       nicotine (NICODERM CQ - dosed in mg/24 hours) patch 21 mg  21 mg Transdermal Daily Zierle-Ghosh, Asia B, DO       ondansetron (ZOFRAN) tablet 4 mg  4 mg Oral Q6H PRN Zierle-Ghosh, Asia B, DO       Or   ondansetron (ZOFRAN) injection 4 mg  4 mg Intravenous Q6H PRN Zierle-Ghosh, Asia B, DO       oxyCODONE (Oxy IR/ROXICODONE) immediate release tablet 5 mg  5 mg Oral Q4H PRN Zierle-Ghosh, Asia B, DO       pantoprazole (PROTONIX) injection 40 mg  40 mg Intravenous Q12H Zierle-Ghosh, Asia B, DO  rosuvastatin (CRESTOR) tablet 10 mg  10 mg Oral q morning Zierle-Ghosh, Asia B, DO       tamsulosin (FLOMAX) capsule 0.4 mg  0.4 mg Oral Daily Zierle-Ghosh, Asia B, DO        Allergies as of 09/19/2022 - Review Complete 09/19/2022  Allergen Reaction Noted   Dilaudid [hydromorphone hcl] Other (See Comments) 04/06/2014   Lyrica [pregabalin] Nausea Only and Other (See Comments)    Toradol [ketorolac tromethamine] Other (See Comments) 02/04/2014   Tramadol  05/02/2022    Past Medical History:  Diagnosis Date   Bilateral renal cysts    Bladder cancer ([place]) UROLOGIST-  DR [facility]   RECURRENT 11/ 2017 --  hx turbt in 2004 and 08-19-2013   BPH without obstruction/lower urinary tract symptoms    Cervical fusion syndrome    LEFT ARM NUMBNESS--  CONTROLLED W/ GABAPENTIN   Coronary artery disease    CARDIOLOGIST-- DR Harl Bowie [name])   DDD (degenerative disc disease), cervical    DDD (degenerative disc disease), lumbar    Diverticulosis of sigmoid colon    Heart murmur    slight murmur per patient   History of acute gouty arthritis    History of adenomatous polyp of colon    2001;  2012- non-malignant leiomyoma;  04-07-2014  tubualr adenoma and hyperplastic polyp's   History of prostate cancer UROLOGIST-  DR [facility]   S/P  RADIOACTIVE SEED IMPLANTS 2004   HOH (hard of hearing)    no hearing aids    Hyperlipidemia    Hypertension    OA (osteoarthritis)    Occlusion and stenosis of carotid artery without mention of cerebral infarction CARDIOLOGIST  -- DR  [name]   LAST DUPLEX  11-23-2015  RICA  (DUPLEX DOPPLER 01-20-2016 RICA 99-35% PROXIMAL) /   LICA <70% (HAD CONSULT W/ [name] , VASCULAR SURGEON 01-20-2016)   OSA on CPAP    BiPAP   Prostate cancer ([place])    per patient "years ago"- had radiation implant   Spondylosis, cervical    Type 2 diabetes mellitus ([place])    Wears glasses     Past Surgical History:  Procedure Laterality Date   ANTERIOR REMOVAL CAGE AND PLATE C3-C7/ CORPECTOMY C7 (FX)/  ALLOGRAFT AND FUSION C3 -- T1/  POSTERIOR DECOMPRESSION BILATERAL LAMINECTOMY C4 -- 6 & PARTIAL C3/  POSTEROLATERAL ARTHRODESIS C3 - T1  06/05/2005   APPENDECTOMY  1990'S   CATARACT EXTRACTION W/ INTRAOCULAR LENS  IMPLANT, BILATERAL  2013   CERVICAL FUSION  05/11/2005   C3  --  C7/  due to post op quadriparesis same day s/p  anterior C 4,5,6, colpectomy, decompression, removal epidural hematoma, foraminnotomy, cage and plate   COLONOSCOPY  last one 04-07-2014   CYSTOSCOPY W/ RETROGRADES Bilateral 08/19/2013   Procedure: CYSTOSCOPY WITH RETROGRADE PYELOGRAM;  Surgeon: Molli Hazard, MD;  Location[facility];  Service: Urology;  Laterality: Bilateral;   CYSTOSCOPY W/ RETROGRADES Bilateral 07/18/2016   Procedure: CYSTOSCOPY WITH RETROGRADE PYELOGRAM;  Surgeon: Alexis Frock, MD;  Location: [facility];  Service: Urology;  Laterality: Bilateral;   ENDOSCOPIC REPAIR CSF LEAK VIA NASAL PASSAGE  2011   INTRAOPERATIVE ARTERIOGRAM  CATH LAB  01-05-2009  DR Einar Gip   RICA   ACUTE ANGLE 80-85% STENOSIS   KNEE ARTHROSCOPY Left 07/15/2017   Procedure: ARTHROSCOPY LEFT  KNEE AND DEBRIDEMENT, medial meniscal tear and chondromalaita;  Surgeon: Paralee Cancel, MD;  Location: [facility];  Service: Orthopedics;  Laterality: Left;  [place]  03-26-2012    L3 --  L5   RADIOACTIVE PROSTATE SEED IMPLANTS  08-19-2003   SEPTOPLASTY  1998   TOTAL KNEE ARTHROPLASTY Left 02/25/2018   Procedure: LEFT TOTAL KNEE ARTHROPLASTY;  Surgeon: Paralee Cancel, MD;  Location: [facility];  Service: Orthopedics;  Laterality: Left;  70 mins   TRANSCAROTID ARTERY REVASCULARIZATION  Right 05/11/2022   Procedure: Right Transcarotid Artery Revascularization;  Surgeon: Waynetta Sandy, MD;  Location: [place];  Service: Vascular;  Laterality: Right;   TRANSURETHRAL RESECTION OF BLADDER TUMOR N/A 08/19/2013   Procedure: TRANSURETHRAL RESECTION OF BLADDER TUMOR (TURBT);  Surgeon: Molli Hazard, MD;  Location: [facility];  Service: Urology;  Laterality: N/A;   TRANSURETHRAL RESECTION OF BLADDER TUMOR N/A 07/18/2016   Procedure: TRANSURETHRAL RESECTION OF BLADDER TUMOR (TURBT);  Surgeon: Alexis Frock, MD;  Location[facility];  Service: Urology;  Laterality: N/A;   YAG LASER APPLICATION  42/87/6811   Procedure: YAG LASER APPLICATION;  Surgeon: Elta Guadeloupe T. Gershon Crane, MD;  Location: [facility];  Service: Ophthalmology;  Laterality: Right;    Family History  Problem Relation Age of Onset   Breast cancer Mother        mets   Diabetes Mother    Heart disease Father        MI   Colon cancer Brother    Cancer Sister        male organs   Diabetes Sister        family hx   Arthritis Other        entire family   Diabetes Brother        x 2    Social History   Socioeconomic History   Marital status: Married    Spouse name: Not on file   Number of children: 2   Years of education: Not on file   Highest education level: Not on file  Occupational History   Occupation: retired//disabled  Tobacco Use   Smoking status: Every Day    Packs/day: 1.00    Years: 47.00    Total pack years: 47.00    Types: Cigarettes   Smokeless tobacco: Never   Tobacco comments:    pt quits smoking periodically :: started smoking again Jan 2018 1ppd   Vaping Use   Vaping Use: Never used  Substance and Sexual Activity   Alcohol use: No    Alcohol/week: 0.0 standard drinks of alcohol   Drug use: No   Sexual activity: Not on file  Other Topics Concern   Not on file  Social History Narrative   Not on file   Social Determinants of Health   Financial Resource Strain: Not on file  Food Insecurity: Not on file  Transportation Needs: Not on file  Physical Activity: Not on file  Stress: Not on file  Social Connections: Not on file  Intimate Partner Violence: Not on file     Review of System:   General: Negative for anorexia,  fever, chills, fatigue, +weakness. See hpi Eyes: Negative for vision changes.  ENT: Negative for hoarseness, difficulty swallowing , nasal congestion. CV: Negative for chest pain, angina, palpitations, dyspnea on exertion, peripheral edema.  Respiratory: Negative for dyspnea at rest, dyspnea on exertion. +cough, + yellow sputum GI: See history of present illness. GU:  Negative for dysuria, hematuria, urinary incontinence, urinary frequency, nocturnal urination.  MS: Negative for joint pain, low back pain.  Derm: Negative for rash or itching.  Neuro: Negative for weakness, abnormal sensation, seizure, frequent headaches, memory loss, confusion.  Psych: Negative for anxiety, depression, suicidal ideation, hallucinations.  Endo: Negative for unusual weight change.  Heme: Negative for bruising or bleeding. Allergy: Negative for rash or hives.      Physical Examination:   Vital signs in last 24 hours: Temp:  [97.8 F (36.6 C)-98.2 F (36.8 C)] 98 F (36.7 C) (01/04 0453) Pulse Rate:  [78-96] 78 (01/04 0453) Resp:  [16-22] 20 (01/04 0453) BP: (124-148)/(57-85) 146/74 (01/04 0453) SpO2:  [92 %-98 %] 94 % (01/04 0453) Weight:  [80.8 kg-82.1 kg] 80.8 kg (01/04 0453) Last BM Date : 09/19/22  General: Well-nourished, well-developed in no acute distress.  Head: Normocephalic, atraumatic.   Eyes:  Conjunctiva pale, no icterus. Mouth: Oropharyngeal mucosa moist and pink, no lesions erythema or exudate. Neck: Supple without thyromegaly, masses, or lymphadenopathy.  Lungs: Clear to auscultation bilaterally.  Heart: Regular rate and rhythm, no rubs or gallops. 3/6 SEM  Abdomen: Bowel sounds are normal, nontender, nondistended, no hepatosplenomegaly or masses, no abdominal bruits or hernia , no rebound or guarding.   Rectal: not performed Extremities:no lower extremity edema, clubbing, deformity.  Neuro: Alert and oriented x 4 , grossly normal neurologically.  Skin: Warm and dry, no rash or jaundice.   Psych: Alert and cooperative, normal mood and affect.        Intake/Output from previous day: 01/03 0701 - 01/04 0700 In: 531.8 [P.O.:240; I.V.:5.8; Blood:286] Out: 700 [Urine:700] Intake/Output this shift: No intake/output data recorded.  Lab Results:   CBC Recent Labs    09/19/22 1841 09/20/22 0655  WBC 7.3 8.4  HGB 7.0* 8.1*  HCT 22.4* 24.8*  MCV 100.9* 94.3  PLT 195 193   BMET Recent Labs    09/19/22 1841 09/20/22 0655  NA 140 140  K 3.9 4.0  CL 104 107  CO2 27 25  GLUCOSE 167* 112*  BUN 28* 24*  CREATININE 1.23 1.07  CALCIUM 8.7* 8.5*   LFT Recent Labs    09/20/22 0655  BILITOT 0.7  ALKPHOS 37*  AST 17  ALT 18  PROT 6.3*  ALBUMIN 3.4*    Lipase No results for input(s): "LIPASE" in the last 72 hours.  PT/INR No results for input(s): "LABPROT", "INR" in the last 72 hours.   Hepatitis Panel No results for input(s): "HEPBSAG", "HCVAB", "HEPAIGM", "HEPBIGM" in the last 72 hours.   Imaging Studies:   DG Chest 2 View  Result Date: 09/19/2022 CLINICAL DATA:  Chest pain shortness of breath for 1 week EXAM: CHEST - 2 VIEW COMPARISON:  09/03/2022 FINDINGS: Cardiac shadow is within normal limits. Lungs are well aerated bilaterally. No focal infiltrate or effusion is seen. Postsurgical changes in the lumbar spine are noted. IMPRESSION: No active  cardiopulmonary disease. Electronically Signed   By: Inez Catalina M.D.   On: 09/19/2022 19:02   VAS US CAROTID  Result Date: 09/19/2022 Carotid Arterial Duplex Study Patient Name:  [name]  Date of Exam:   09/19/2022 Medical Rec #: [id number]       Accession #:    [id number] Date of Birth: 04/07/1946        Patient Gender: M Patient Age:   64 years Exam Location:  [facility] Procedure:      VAS US CAROTID Referring Phys: Servando Snare --------------------------------------------------------------------------------  Indications:       Carotid artery disease, Right stent and RT TCAR 05/11/22 by Dr  Donzetta Matters. Risk Factors:      Hypertension, hyperlipidemia, Diabetes, current smoker,                    coronary artery disease, PAD. Other Factors:     Hx bladder and prostate cancer. Comparison Study:  06/13/22 Prior Carotid                    03/22/22 Prior CTA neck Performing Technologist: Elta Guadeloupe RVT, RDMS  Examination Guidelines: A complete evaluation includes B-mode imaging, spectral Doppler, color Doppler, and power Doppler as needed of all accessible portions of each vessel. Bilateral testing is considered an integral part of a complete examination. Limited examinations for reoccurring indications may be performed as noted.  Right Carotid Findings: +----------+--------+--------+--------+----------------------+--------+           PSV cm/sEDV cm/sStenosisPlaque Description    Comments +----------+--------+--------+--------+----------------------+--------+ CCA Prox  100     22                                             +----------+--------+--------+--------+----------------------+--------+ CCA Mid   113     22                                             +----------+--------+--------+--------+----------------------+--------+ CCA Distal90      19      <50%    smooth and hyperechoic          +----------+--------+--------+--------+----------------------+--------+ ICA Prox  184     41      40-59%  calcific              stent    +----------+--------+--------+--------+----------------------+--------+ ICA Mid   201     47      40-59%  calcific              stent    +----------+--------+--------+--------+----------------------+--------+ ICA Distal115     28                                             +----------+--------+--------+--------+----------------------+--------+ ECA       88                                                     +----------+--------+--------+--------+----------------------+--------+ +----------+--------+-------+----------------+-------------------+           PSV cm/sEDV cmsDescribe        Arm Pressure (mmHG) +----------+--------+-------+----------------+-------------------+ [id number]            Multiphasic, WNL                    +----------+--------+-------+----------------+-------------------+ +---------+--------+--+--------+--+---------+ VertebralPSV cm/s56EDV cm/s13Antegrade +---------+--------+--+--------+--+---------+  Left Carotid Findings: +----------+-------+--------+--------+-------------------------------+---------+           PSV    EDV cm/sStenosisPlaque Description             Comments            cm/s                                                            +----------+-------+--------+--------+-------------------------------+---------+  CCA Prox  92                                                              +----------+-------+--------+--------+-------------------------------+---------+ CCA Mid   79                     calcific                                 +----------+-------+--------+--------+-------------------------------+---------+ CCA Distal112    19      <50%    diffuse, heterogenous and      Shadowing                                  calcific                                  +----------+-------+--------+--------+-------------------------------+---------+ ICA Prox  320    77      60-79%  heterogenous and calcific      Shadowing +----------+-------+--------+--------+-------------------------------+---------+ ICA Mid   398    86      60-79%                                           +----------+-------+--------+--------+-------------------------------+---------+ ICA Distal177    34                                                       +----------+-------+--------+--------+-------------------------------+---------+ ECA       107                    diffuse and calcific                     +----------+-------+--------+--------+-------------------------------+---------+ +----------+--------+--------+----------------+-------------------+           PSV cm/sEDV cm/sDescribe        Arm Pressure (mmHG) +----------+--------+--------+----------------+-------------------+ [id number]             Multiphasic, WNL                    +----------+--------+--------+----------------+-------------------+ +---------+--------+--+--------+--+---------+ VertebralPSV cm/s62EDV cm/s19Antegrade +---------+--------+--+--------+--+---------+   Summary: Right Carotid: Velocities in the right ICA are consistent with a 40-59%                stenosis. Non-hemodynamically significant plaque <50% noted in                the CCA. Patent RT ICA stent. Left Carotid: Velocities in the left ICA are consistent with a 60-79% stenosis.               Non-hemodynamically significant plaque <50% noted in the CCA. Vertebrals:  Bilateral vertebral arteries demonstrate antegrade flow. Subclavians: Normal flow hemodynamics were seen in bilateral subclavian  arteries. *See table(s) above for measurements and observations.  Electronically signed by Servando Snare MD on 09/19/2022 at 11:35:13 AM.    Final    DG Chest 2 View  Result Date: 09/03/2022 CLINICAL DATA:  Congestion,  shortness of breath, cough, weakness EXAM: CHEST - 2 VIEW COMPARISON:  Chest radiograph 02/03/2017, CT chest 07/03/2017 FINDINGS: The cardiomediastinal silhouette is normal. There is no focal consolidation or pulmonary edema. There is no pleural effusion or pneumothorax There is no acute osseous abnormality. Lumbar spine fusion hardware is partially imaged. IMPRESSION: No radiographic evidence of acute cardiopulmonary process. Electronically Signed   By: Valetta Mole M.D.   On: 09/03/2022 14:50  [4 week]  Assessment:   77 y/o male with multiple comorbidities including recent carotid artery stent placement in August 2023 on aspirin and Plavix, CAD, bladder cancer, prostate cancer, diabetes, hypertension, tubular adenoma of the cecum not removed in 2015, degenerative disc disease presenting to the ED with complaints of cough, shortness of breath, lightheadedness, melena.  Acute on chronic anemia/GI bleed: Reports melena for several weeks.  Stool heme positive.  On Plavix and aspirin for history of stent placement in August.  Celebrex noted on medication list, patient denies ibuprofen use.  Patient has a history of tubular adenoma which was not removed from the cecum in 2015, per report because he was on Plavix at the time.  Presenting now with significant change in hemoglobin.  Ferritin of 11.  Also with B12 deficiency.  Given black stools, would be concerned about upper GI source.  However with known history of tubular adenoma left behind in 2015, he needs to have a colonoscopy.  Will plan for colonoscopy and upper endoscopy tomorrow, patient agreeable.  Plan:   Monitor H&H, transfuse as needed. Replete B12. Consider resuming Plavix given recent stent placement. Agree with IV PPI twice daily. Clear liquids today, n.p.o. after midnight. Colonoscopy/EGD tomorrow.  I have discussed the risks, alternatives, benefits with regards to but not limited to the risk of reaction to medication, bleeding, infection,  perforation and the patient is agreeable to proceed. Written consent to be obtained.    LOS: 1 day   We would like to thank you for the opportunity to participate in the care of [name].  Laureen Ochs. Bernarda Caffey [facility] [phone] 1/4/20248:00 AM

## 2022-09-20 NOTE — Progress Notes (Signed)
Patient slept during the night after admission to 300. He received 1 unit of PRBC, patient tolerated well. No complaints of pain or discomfort at this time.

## 2022-09-20 NOTE — Assessment & Plan Note (Signed)
-   Heme positive on FOBT - GI consulted and recommends clear liquids, they plan to see patient in the a.m. and decide next steps - Continue Protonix 40 mg IV twice daily - Continue to monitor

## 2022-09-20 NOTE — Assessment & Plan Note (Signed)
-   Hold metformin -Sliding scale coverage 

## 2022-09-20 NOTE — Evaluation (Addendum)
Clinical/Bedside Swallow Evaluation Patient Details  Name: ZEKIAH CARUTH MRN: [id number] Date of Birth: 1945/10/25  Today's Date: 09/20/2022 Time: SLP Start Time (ACUTE ONLY): 73 SLP Stop Time (ACUTE ONLY): 5009 SLP Time Calculation (min) (ACUTE ONLY): 22 min  Past Medical History:  Past Medical History:  Diagnosis Date   Bilateral renal cysts    Bladder cancer ([place]) UROLOGIST-  DR [facility]   RECURRENT 11/ 2017 --  hx turbt in 2004 and 08-19-2013   BPH without obstruction/lower urinary tract symptoms    Cervical fusion syndrome    LEFT ARM NUMBNESS--  CONTROLLED W/ GABAPENTIN   Coronary artery disease    CARDIOLOGIST-- DR Harl Bowie [name])   DDD (degenerative disc disease), cervical    DDD (degenerative disc disease), lumbar    Diverticulosis of sigmoid colon    Heart murmur    slight murmur per patient   History of acute gouty arthritis    History of adenomatous polyp of colon    2001;  2012- non-malignant leiomyoma;  04-07-2014  tubualr adenoma and hyperplastic polyp's   History of prostate cancer UROLOGIST-  DR [facility]   S/P  RADIOACTIVE SEED IMPLANTS 2004   HOH (hard of hearing)    no hearing aids   Hyperlipidemia    Hypertension    OA (osteoarthritis)    Occlusion and stenosis of carotid artery without mention of cerebral infarction CARDIOLOGIST  -- DR  [name]   LAST DUPLEX  11-23-2015  RICA  (DUPLEX DOPPLER 01-20-2016 RICA 38-18% PROXIMAL) /   LICA <29% (HAD CONSULT W/ [name] , VASCULAR SURGEON 01-20-2016)   OSA on CPAP    BiPAP   Prostate cancer ([place])    per patient "years ago"- had radiation implant   Spondylosis, cervical    Type 2 diabetes mellitus ([place])    Wears glasses    Past Surgical History:  Past Surgical History:  Procedure Laterality Date   ANTERIOR REMOVAL CAGE AND PLATE C3-C7/ CORPECTOMY C7 (FX)/  ALLOGRAFT AND FUSION C3 -- T1/  POSTERIOR DECOMPRESSION BILATERAL LAMINECTOMY C4 -- 6 & PARTIAL C3/  POSTEROLATERAL ARTHRODESIS C3 - T1   06/05/2005   APPENDECTOMY  1990'S   CATARACT EXTRACTION W/ INTRAOCULAR LENS  IMPLANT, BILATERAL  2013   CERVICAL FUSION  05/11/2005   C3  --  C7/  due to post op quadriparesis same day s/p  anterior C 4,5,6, colpectomy, decompression, removal epidural hematoma, foraminnotomy, cage and plate   COLONOSCOPY  last one 04-07-2014   CYSTOSCOPY W/ RETROGRADES Bilateral 08/19/2013   Procedure: CYSTOSCOPY WITH RETROGRADE PYELOGRAM;  Surgeon: Molli Hazard, MD;  Location: [facility];  Service: Urology;  Laterality: Bilateral;   CYSTOSCOPY W/ RETROGRADES Bilateral 07/18/2016   Procedure: CYSTOSCOPY WITH RETROGRADE PYELOGRAM;  Surgeon: Alexis Frock, MD;  Location[facility];  Service: Urology;  Laterality: Bilateral;   ENDOSCOPIC REPAIR CSF LEAK VIA NASAL PASSAGE  2011   INTRAOPERATIVE ARTERIOGRAM  CATH LAB  01-05-2009  DR Einar Gip   RICA   ACUTE ANGLE 80-85% STENOSIS   KNEE ARTHROSCOPY Left 07/15/2017   Procedure: ARTHROSCOPY LEFT  KNEE AND DEBRIDEMENT, medial meniscal tear and chondromalaita;  Surgeon: Paralee Cancel, MD;  Location: [facility];  Service: Orthopedics;  Laterality: Left;  60 MINS   LUMBAR FUSION  03-26-2012   L3 --  L5   RADIOACTIVE PROSTATE SEED IMPLANTS  08-19-2003   SEPTOPLASTY  1998   TOTAL KNEE ARTHROPLASTY Left 02/25/2018   Procedure: LEFT TOTAL KNEE ARTHROPLASTY;  Surgeon: Paralee Cancel, MD;  Location: [facility];  Service: Orthopedics;  Laterality: Left;  70 mins   TRANSCAROTID ARTERY REVASCULARIZATION  Right 05/11/2022   Procedure: Right Transcarotid Artery Revascularization;  Surgeon: Waynetta Sandy, MD;  Location: [facility];  Service: Vascular;  Laterality: Right;   TRANSURETHRAL RESECTION OF BLADDER TUMOR N/A 08/19/2013   Procedure: TRANSURETHRAL RESECTION OF BLADDER TUMOR (TURBT);  Surgeon: Molli Hazard, MD;  Location: [facility];  Service: Urology;  Laterality: N/A;   TRANSURETHRAL RESECTION OF BLADDER TUMOR N/A  07/18/2016   Procedure: TRANSURETHRAL RESECTION OF BLADDER TUMOR (TURBT);  Surgeon: Alexis Frock, MD;  Location: [facility];  Service: Urology;  Laterality: N/A;   YAG LASER APPLICATION  53/61/4431   Procedure: YAG LASER APPLICATION;  Surgeon: Elta Guadeloupe T. Gershon Crane, MD;  Location: [facility];  Service: Ophthalmology;  Laterality: Right;   HPI:  SAMAAD HASHEM is a 77 y.o. male with medical history significant of coronary artery disease, hyperlipidemia, hypertension, degenerative disc disease, osteoarthritis, OSA on CPAP, prostate cancer, type 2 diabetes mellitus, tobacco use disorder, and more presents the ED with a chief complaint of chest pain and dyspnea.  Patient reports she has had chest pain, dyspnea, dizziness for 1 week.  Dizziness he describes is consistent with orthostatic hypotension.  He reports when he goes from sitting to standing he feels like he might pass out. For the last 2 weeks, family at bedside reports that his blood pressure has been volatile going both very high, and then dipping down to systolic blood pressure of 80.  Family at bedside also reports he is all 65 pounds in 1 year unintentionally.    Assessment / Plan / Recommendation  Clinical Impression  Clinical swallowing evaluation completed whle Pt was sitting up on the side of the bed; SLP provided trials of thin liquids as pt is on clear liquid diet per GI recommendation. Pt denies swallowing problems with PO but reports occasional "choking" on secretions. Pt consumed thin liquids without overt s/sx of aspiration, however, Pt was not assessed with solids. ST will defer to GI for diet recommendation now and will f/u after EGD/colonoscopy for diet check and oropharyngeal assessment with solid textures. Thank you for this referral, SLP Visit Diagnosis: Dysphagia, unspecified (R13.10)    Aspiration Risk  Mild aspiration risk    Diet Recommendation Thin liquid;Regular   Liquid Administration via:  Cup;Straw Medication Administration: Whole meds with liquid    Other  Recommendations Oral Care Recommendations: Oral care BID    Recommendations for follow up therapy are one component of a multi-disciplinary discharge planning process, led by the attending physician.  Recommendations may be updated based on patient status, additional functional criteria and insurance authorization.           Frequency and Duration min 1 x/week  1 week       Prognosis Prognosis for Safe Diet Advancement: Good      Swallow Study   General Date of Onset: 09/19/22 HPI: TIN ENGRAM is a 77 y.o. male with medical history significant of coronary artery disease, hyperlipidemia, hypertension, degenerative disc disease, osteoarthritis, OSA on CPAP, prostate cancer, type 2 diabetes mellitus, tobacco use disorder, and more presents the ED with a chief complaint of chest pain and dyspnea.  Patient reports she has had chest pain, dyspnea, dizziness for 1 week.  Dizziness he describes is consistent with orthostatic hypotension.  He reports when he goes from sitting to standing he feels like he might  pass out. For the last 2 weeks, family at bedside reports that his blood pressure has been volatile going both very high, and then dipping down to systolic blood pressure of 80.  Family at bedside also reports he is all 65 pounds in 1 year unintentionally. Type of Study: Bedside Swallow Evaluation Previous Swallow Assessment: none in chart Diet Prior to this Study: Thin liquids Temperature Spikes Noted: No History of Recent Intubation: No Behavior/Cognition: Alert;Cooperative;Pleasant mood Oral Cavity Assessment: Within Functional Limits Oral Care Completed by SLP: No Oral Cavity - Dentition: Adequate natural dentition Vision: Functional for self-feeding Self-Feeding Abilities: Able to feed self Patient Positioning: Upright in bed Baseline Vocal Quality: Normal Volitional Cough: Strong Volitional Swallow: Able  to elicit    Oral/Motor/Sensory Function     Ice Chips Ice chips: Within functional limits   Thin Liquid Thin Liquid: Within functional limits    Nectar Thick Nectar Thick Liquid: Not tested   Honey Thick Honey Thick Liquid: Not tested   Puree Puree: Not tested   Solid     Solid: Not tested     Brailyn Killion H. Roddie Mc, CCC-SLP Speech Language Pathologist  Wende Bushy 09/20/2022,3:16 PM

## 2022-09-21 ENCOUNTER — Other Ambulatory Visit: Payer: Self-pay

## 2022-09-21 ENCOUNTER — Inpatient Hospital Stay (HOSPITAL_COMMUNITY): Payer: Medicare HMO | Admitting: Anesthesiology

## 2022-09-21 ENCOUNTER — Encounter (HOSPITAL_COMMUNITY)
Admission: EM | Disposition: A | Discharge status: Home / Self Care | Payer: Self-pay | Source: Home / Self Care | Attending: Family Medicine

## 2022-09-21 ENCOUNTER — Encounter (HOSPITAL_COMMUNITY): Payer: Self-pay | Admitting: Family Medicine

## 2022-09-21 DIAGNOSIS — K921 Melena: Secondary | ICD-10-CM

## 2022-09-21 DIAGNOSIS — D120 Benign neoplasm of cecum: Secondary | ICD-10-CM

## 2022-09-21 DIAGNOSIS — K5730 Diverticulosis of large intestine without perforation or abscess without bleeding: Secondary | ICD-10-CM

## 2022-09-21 DIAGNOSIS — I2510 Atherosclerotic heart disease of native coronary artery without angina pectoris: Secondary | ICD-10-CM

## 2022-09-21 DIAGNOSIS — F17210 Nicotine dependence, cigarettes, uncomplicated: Secondary | ICD-10-CM

## 2022-09-21 DIAGNOSIS — D649 Anemia, unspecified: Secondary | ICD-10-CM | POA: Diagnosis not present

## 2022-09-21 HISTORY — PX: ESOPHAGOGASTRODUODENOSCOPY (EGD) WITH PROPOFOL: SHX5813

## 2022-09-21 HISTORY — PX: POLYPECTOMY: SHX5525

## 2022-09-21 HISTORY — PX: HEMOSTASIS CLIP PLACEMENT: SHX6857

## 2022-09-21 HISTORY — PX: BIOPSY: SHX5522

## 2022-09-21 HISTORY — PX: COLONOSCOPY WITH PROPOFOL: SHX5780

## 2022-09-21 LAB — HEMOGLOBIN AND HEMATOCRIT, BLOOD
HCT: 25.1 % — ABNORMAL LOW (ref 39.0–52.0)
Hemoglobin: 7.8 g/dL — ABNORMAL LOW (ref 13.0–17.0)

## 2022-09-21 LAB — GLUCOSE, CAPILLARY
Glucose-Capillary: 102 mg/dL — ABNORMAL HIGH (ref 70–99)
Glucose-Capillary: 101 mg/dL — ABNORMAL HIGH (ref 70–99)
Glucose-Capillary: 109 mg/dL — ABNORMAL HIGH (ref 70–99)
Glucose-Capillary: 120 mg/dL — ABNORMAL HIGH (ref 70–99)

## 2022-09-21 LAB — BPAM RBC
Blood Product Expiration Date: 202401302359
ISSUE DATE / TIME: 202401040219
Unit Type and Rh: 5100

## 2022-09-21 LAB — TYPE AND SCREEN
ABO/RH(D): O POS
Antibody Screen: NEGATIVE
Unit division: 0

## 2022-09-21 SURGERY — ESOPHAGOGASTRODUODENOSCOPY (EGD) WITH PROPOFOL
Anesthesia: General

## 2022-09-21 MED ORDER — PROPOFOL 500 MG/50ML IV EMUL
INTRAVENOUS | Status: DC | PRN
  Administered 2022-09-21: 150 ug/kg/min via INTRAVENOUS

## 2022-09-21 MED ORDER — LIDOCAINE HCL (CARDIAC) PF 100 MG/5ML IV SOSY
PREFILLED_SYRINGE | INTRAVENOUS | Status: DC | PRN
  Administered 2022-09-21: 60 mg via INTRAVENOUS

## 2022-09-21 MED ORDER — SODIUM CHLORIDE 0.9 % IV SOLN: INTRAVENOUS | Status: DC

## 2022-09-21 MED ORDER — PROPOFOL 10 MG/ML IV BOLUS
INTRAVENOUS | Status: DC | PRN
  Administered 2022-09-21: 20 mg via INTRAVENOUS
  Administered 2022-09-21: 40 mg via INTRAVENOUS

## 2022-09-21 MED ORDER — PHENYLEPHRINE 80 MCG/ML (10ML) SYRINGE FOR IV PUSH (FOR BLOOD PRESSURE SUPPORT)
PREFILLED_SYRINGE | INTRAVENOUS | Status: DC | PRN
  Administered 2022-09-21: 160 ug via INTRAVENOUS
  Administered 2022-09-21: 80 ug via INTRAVENOUS
  Administered 2022-09-21: 160 ug via INTRAVENOUS

## 2022-09-21 MED ORDER — VITAMIN B-12 1000 MCG PO TABS: 1000.0000 ug | ORAL_TABLET | Freq: Every day | ORAL | 1 refills | Status: AC

## 2022-09-21 MED ORDER — STERILE WATER FOR IRRIGATION IR SOLN
Status: DC | PRN
  Administered 2022-09-21: 120 mL

## 2022-09-21 MED ORDER — LACTATED RINGERS IV SOLN: INTRAVENOUS | Status: DC

## 2022-09-21 NOTE — Discharge Instructions (Signed)
Advised to follow-up with primary care physician in 1 week. Advised to follow-up with gastroenterology Dr. Donzetta Kohut in 1 week. Advised to resume aspirin and Plavix from tomorrow.

## 2022-09-21 NOTE — Discharge Summary (Signed)
Physician Discharge Summary  [name] PZW:[id number] DOB: 02-02-1946 DOA: 09/19/2022  PCP: [name], MD  Admit date: 09/19/2022  Discharge date: 09/21/2022  Admitted From: Home.  Disposition:  Home.  Recommendations for Outpatient Follow-up:  Follow up with PCP in 1-2 weeks. Please obtain BMP/CBC in one week. Advised to follow-up with gastroenterology Dr. Donzetta Kohut in 1 week. Advised to resume aspirin and Plavix from tomorrow.  Home Health: None Equipment/Devices:None  Discharge Condition: Stable CODE STATUS:DNR Diet recommendation: Heart Healthy   Brief Summary / Hospital Course: This 77 years old male with PMH significant for coronary artery disease, hyperlipidemia, hypertension, degenerative disc disease, osteoarthritis, OSA on CPAP, prostate cancer, type 2 diabetes mellitus, tobacco use disorder, presented in the ED with complaints of chest tightness , dizziness and shortness of breath on exertion.  He also reports near syncopal episodes.  He is found to have a hemoglobin 7.0 which is significant drop from 11.0 few months back.  He is also found to have occult+ stools, Patient has received 1 unit of packed red blood cells.  He reports improvement in his symptoms of chest tightness dizziness and shortness of breath on exertion.  GI is consulted, patient underwent EGD and colonoscopy.  EGD showed normal esophagus abnormal gastric mucosa s/p biopsy.  Colonoscopy showed diverticulosis in sigmoid colon descending colon and splenic flexure, cecal polyp which was removed.  Patient tolerated procedure well , doing well.  Patient cleared from GI to be discharged.  Patient is being discharged home    Discharge Diagnoses:  Principal Problem:   Symptomatic anemia Active Problems:   CAD (coronary artery disease)   DM type 2 (diabetes mellitus, type 2) (HCC)   Tobacco abuse   GI bleed   Dysphagia   ABLA (acute blood loss anemia)  Assessment and plan:  Symptomatic anemia sec. to GI  bleeding: Patient presented with chest tightness, dizziness, presyncopal episodes and shortness of breath on exertion.  Hemoglobin down to 7.0 from 11.0,  15 months ago.  Stool for occult blood positive. Hold aspirin and Plavix. Hold pharmaceutical DVT prophylaxis. S/p 1 unit PRBC,  hemoglobin improved to 8.1. Continue to trend H&H q 12hr Patient underwent EGD and colonoscopy, tolerated well. GI signed off recommended patient can be discharged home.   Dysphagia: Patient reports more frequent aspiration events. Speech and swallow evaluation completed.  Tolerating soft diet   Tobacco abuse: Patient continues to smoke 1 pack/day Nicotine patch   DM type II: Resume metformin. Continue regular insulin sliding scale. Carb modified diet   Coronary artery disease: Hold aspirin and Plavix in the setting of GI bleed. Continue labetalol, losartan and Crestor. Troponin 5> 5 not significant. EKG showed atrial fibrillation,  heart rate well controlled.  Discharge Instructions  Discharge Instructions     Ambulatory Referral for Lung Cancer Scre   Complete by: As directed    Call MD for:  difficulty breathing, headache or visual disturbances   Complete by: As directed    Call MD for:  persistant dizziness or light-headedness   Complete by: As directed    Call MD for:  persistant nausea and vomiting   Complete by: As directed    Diet - low sodium heart healthy   Complete by: As directed    Diet Carb Modified   Complete by: As directed    Discharge instructions   Complete by: As directed    Advised to follow-up with primary care physician in 1 week. Advised to follow-up with gastroenterology Dr.  Ruark in 1 week. Advised to resume aspirin and Plavix from tomorrow.   Increase activity slowly   Complete by: As directed       Allergies as of 09/21/2022       Reactions   Dilaudid [hydromorphone Hcl] Other (See Comments)   "acts a little off"   Lyrica [pregabalin] Nausea Only, Other  (See Comments)   Make me feel "bad"   Toradol [ketorolac Tromethamine] Other (See Comments)   CauseD  hallucination ?-states not sure/ avoids per his MD   Tramadol    Couldn't sleep        Medication List     STOP taking these medications    celecoxib 200 MG capsule Commonly known as: CELEBREX   ibuprofen 200 MG tablet Commonly known as: ADVIL       TAKE these medications    acetaminophen 500 MG tablet Commonly known as: TYLENOL Take 1,000 mg by mouth every 6 (six) hours as needed for moderate pain.   allopurinol 300 MG tablet Commonly known as: ZYLOPRIM Take 300 mg by mouth at bedtime.   amLODipine 10 MG tablet Commonly known as: NORVASC TAKE 1 TABLET BY MOUTH EVERY DAY   aspirin EC 81 MG tablet Take 81 mg by mouth at bedtime.   clopidogrel 75 MG tablet Commonly known as: PLAVIX Take 75 mg by mouth daily.   colchicine 0.6 MG tablet Take 0.6 mg by mouth 2 (two) times daily as needed (gout).   cyanocobalamin 1000 MCG tablet Commonly known as: VITAMIN B12 Take 1 tablet (1,000 mcg total) by mouth daily.   docusate sodium 100 MG capsule Commonly known as: COLACE Take 100 mg by mouth at bedtime.   finasteride 5 MG tablet Commonly known as: PROSCAR Take 5 mg by mouth at bedtime.   furosemide 40 MG tablet Commonly known as: LASIX Take 40 mg by mouth every morning.   gabapentin 100 MG capsule Commonly known as: NEURONTIN Take 300 mg by mouth 2 (two) times daily.   guaiFENesin 600 MG 12 hr tablet Commonly known as: MUCINEX Take 600 mg by mouth 2 (two) times daily as needed for cough or to loosen phlegm.   labetalol 300 MG tablet Commonly known as: NORMODYNE Take 100 mg by mouth 2 (two) times daily.   losartan 100 MG tablet Commonly known as: COZAAR Take 1 tablet (100 mg total) by mouth every evening.   metFORMIN 500 MG 24 hr tablet Commonly known as: GLUCOPHAGE-XR Take 1,000 mg by mouth in the morning and at bedtime.   Omega-3-6-9 Caps Take  1 capsule by mouth 2 (two) times daily.   oxyCODONE-acetaminophen 5-325 MG tablet Commonly known as: PERCOCET/ROXICET Take 1 tablet by mouth every 6 (six) hours as needed for moderate pain.   polyethylene glycol powder 17 GM/SCOOP powder Commonly known as: GLYCOLAX/MIRALAX TAKE '17MG'$ (1CAPFUL) BY MOUTH(MIXED WITH WATER/JUICE) DAILY What changed: See the new instructions.   rosuvastatin 10 MG tablet Commonly known as: CRESTOR Take 10 mg by mouth every morning.   tamsulosin 0.4 MG Caps capsule Commonly known as: FLOMAX Take 0.4 mg by mouth daily.   Vitamin D (Ergocalciferol) 1.25 MG (50000 UNIT) Caps capsule Commonly known as: DRISDOL Take 50,000 Units by mouth every Thursday.        Follow-up Information     [name], MD Follow up in 1 week(s).   Specialty: Internal Medicine Contact information: [place] [address] [place],  [name], MD Follow up in 1 week(s).   Specialty: Gastroenterology Contact information: [address] [place] [address] [phone]                Allergies  Allergen Reactions   Dilaudid [Hydromorphone Hcl] Other (See Comments)    "acts a little off"   Lyrica [Pregabalin] Nausea Only and Other (See Comments)    Make me feel "bad"   Toradol [Ketorolac Tromethamine] Other (See Comments)    CauseD  hallucination ?-states not sure/ avoids per his MD   Tramadol     Couldn't sleep    Consultations: Gastroenterology   Procedures/Studies: DG Chest 2 View  Result Date: 09/19/2022 CLINICAL DATA:  Chest pain shortness of breath for 1 week EXAM: CHEST - 2 VIEW COMPARISON:  09/03/2022 FINDINGS: Cardiac shadow is within normal limits. Lungs are well aerated bilaterally. No focal infiltrate or effusion is seen. Postsurgical changes in the lumbar spine are noted. IMPRESSION: No active cardiopulmonary disease. Electronically Signed   By: Inez Catalina M.D.   On: 09/19/2022 19:02   VAS US  CAROTID  Result Date: 09/19/2022 Carotid Arterial Duplex Study Patient Name:  THADD APUZZO  Date of Exam:   09/19/2022 Medical Rec #: [id number]       Accession #:    [id number] Date of Birth: 1946-02-03        Patient Gender: M Patient Age:   77 years Exam Location:  [facility] Procedure:      VAS US CAROTID Referring Phys: Servando Snare --------------------------------------------------------------------------------  Indications:       Carotid artery disease, Right stent and RT TCAR 05/11/22 by Dr                    Donzetta Matters. Risk Factors:      Hypertension, hyperlipidemia, Diabetes, current smoker,                    coronary artery disease, PAD. Other Factors:     Hx bladder and prostate cancer. Comparison Study:  06/13/22 Prior Carotid                    03/22/22 Prior CTA neck Performing Technologist: Elta Guadeloupe RVT, RDMS  Examination Guidelines: A complete evaluation includes B-mode imaging, spectral Doppler, color Doppler, and power Doppler as needed of all accessible portions of each vessel. Bilateral testing is considered an integral part of a complete examination. Limited examinations for reoccurring indications may be performed as noted.  Right Carotid Findings: +----------+--------+--------+--------+----------------------+--------+           PSV cm/sEDV cm/sStenosisPlaque Description    Comments +----------+--------+--------+--------+----------------------+--------+ CCA Prox  100     22                                             +----------+--------+--------+--------+----------------------+--------+ CCA Mid   113     22                                             +----------+--------+--------+--------+----------------------+--------+ CCA Distal90      19      <50%    smooth and hyperechoic         +----------+--------+--------+--------+----------------------+--------+ ICA Prox  184  41      40-59%  calcific              stent     +----------+--------+--------+--------+----------------------+--------+ ICA Mid   201     47      40-59%  calcific              stent    +----------+--------+--------+--------+----------------------+--------+ ICA Distal115     28                                             +----------+--------+--------+--------+----------------------+--------+ ECA       88                                                     +----------+--------+--------+--------+----------------------+--------+ +----------+--------+-------+----------------+-------------------+           PSV cm/sEDV cmsDescribe        Arm Pressure (mmHG) +----------+--------+-------+----------------+-------------------+ [id number]            Multiphasic, WNL                    +----------+--------+-------+----------------+-------------------+ +---------+--------+--+--------+--+---------+ VertebralPSV cm/s56EDV cm/s13Antegrade +---------+--------+--+--------+--+---------+  Left Carotid Findings: +----------+-------+--------+--------+-------------------------------+---------+           PSV    EDV cm/sStenosisPlaque Description             Comments            cm/s                                                            +----------+-------+--------+--------+-------------------------------+---------+ CCA Prox  92                                                              +----------+-------+--------+--------+-------------------------------+---------+ CCA Mid   79                     calcific                                 +----------+-------+--------+--------+-------------------------------+---------+ CCA Distal112    19      <50%    diffuse, heterogenous and      Shadowing                                  calcific                                 +----------+-------+--------+--------+-------------------------------+---------+ ICA Prox  320    77      60-79%  heterogenous and  calcific      Shadowing +----------+-------+--------+--------+-------------------------------+---------+ ICA Mid   398    86  60-79%                                           +----------+-------+--------+--------+-------------------------------+---------+ ICA Distal177    34                                                       +----------+-------+--------+--------+-------------------------------+---------+ ECA       107                    diffuse and calcific                     +----------+-------+--------+--------+-------------------------------+---------+ +----------+--------+--------+----------------+-------------------+           PSV cm/sEDV cm/sDescribe        Arm Pressure (mmHG) +----------+--------+--------+----------------+-------------------+ [id number]             Multiphasic, WNL                    +----------+--------+--------+----------------+-------------------+ +---------+--------+--+--------+--+---------+ VertebralPSV cm/s62EDV cm/s19Antegrade +---------+--------+--+--------+--+---------+   Summary: Right Carotid: Velocities in the right ICA are consistent with a 40-59%                stenosis. Non-hemodynamically significant plaque <50% noted in                the CCA. Patent RT ICA stent. Left Carotid: Velocities in the left ICA are consistent with a 60-79% stenosis.               Non-hemodynamically significant plaque <50% noted in the CCA. Vertebrals:  Bilateral vertebral arteries demonstrate antegrade flow. Subclavians: Normal flow hemodynamics were seen in bilateral subclavian              arteries. *See table(s) above for measurements and observations.  Electronically signed by Servando Snare MD on 09/19/2022 at 11:35:13 AM.    Final    DG Chest 2 View  Result Date: 09/03/2022 CLINICAL DATA:  Congestion, shortness of breath, cough, weakness EXAM: CHEST - 2 VIEW COMPARISON:  Chest radiograph 02/03/2017, CT chest 07/03/2017 FINDINGS: The  cardiomediastinal silhouette is normal. There is no focal consolidation or pulmonary edema. There is no pleural effusion or pneumothorax There is no acute osseous abnormality. Lumbar spine fusion hardware is partially imaged. IMPRESSION: No radiographic evidence of acute cardiopulmonary process. Electronically Signed   By: Valetta Mole M.D.   On: 09/03/2022 14:50     Subjective: Patient was seen and examined at bedside.  Overnight events noted.  Patient report doing much better,  patient after EGD and colonoscopy wanted to be discharged.   Patient is discharged.  GI is in agreement.  Discharge Exam: Vitals:   09/21/22 0942 09/21/22 1100  BP: 129/79 (!) 100/52  Pulse: 72 71  Resp: 20 (!) 21  Temp: 98.2 F (36.8 C) 98.4 F (36.9 C)  SpO2: 97% 95%   Vitals:   09/21/22 0411 09/21/22 0826 09/21/22 0942 09/21/22 1100  BP: (!) 140/67 (!) 150/80 129/79 (!) 100/52  Pulse: 72 79 72 71  Resp: 20  20 (!) 21  Temp: (!) 97.4 F (36.3 C)  98.2 F (36.8 C) 98.4 F (36.9 C)  TempSrc: Oral  Oral   SpO2: 98%  97%  95%  Weight:   80.7 kg   Height:   '5\' 7"'$  (1.702 m)     General: Pt is alert, awake, not in acute distress Cardiovascular: RRR, S1/S2 +, no rubs, no gallops Respiratory: CTA bilaterally, no wheezing, no rhonchi Abdominal: Soft, NT, ND, bowel sounds + Extremities: no edema, no cyanosis    The results of significant diagnostics from this hospitalization (including imaging, microbiology, ancillary and laboratory) are listed below for reference.     Microbiology: Recent Results (from the past 240 hour(s))  Resp panel by RT-PCR (RSV, Flu A&B, Covid) Anterior Nasal Swab     Status: None   Collection Time: 09/19/22 10:20 PM   Specimen: Anterior Nasal Swab  Result Value Ref Range Status   SARS Coronavirus 2 by RT PCR NEGATIVE NEGATIVE Final    Comment: (NOTE) SARS-CoV-2 target nucleic acids are NOT DETECTED.  The SARS-CoV-2 RNA is generally detectable in upper  respiratory specimens during the acute phase of infection. The lowest concentration of SARS-CoV-2 viral copies this assay can detect is 138 copies/mL. A negative result does not preclude SARS-Cov-2 infection and should not be used as the sole basis for treatment or other patient management decisions. A negative result may occur with  improper specimen collection/handling, submission of specimen other than nasopharyngeal swab, presence of viral mutation(s) within the areas targeted by this assay, and inadequate number of viral copies(<138 copies/mL). A negative result must be combined with clinical observations, patient history, and epidemiological information. The expected result is Negative.  Fact Sheet for Patients:  EntrepreneurPulse.com.au  Fact Sheet for Healthcare Providers:  IncredibleEmployment.be  This test is no t yet approved or cleared by the [place] FDA and  has been authorized for detection and/or diagnosis of SARS-CoV-2 by FDA under an Emergency Use Authorization (EUA). This EUA will remain  in effect (meaning this test can be used) for the duration of the COVID-19 declaration under Section 564(b)(1) of the Act, 21 U.S.C.section 360bbb-3(b)(1), unless the authorization is terminated  or revoked sooner.       Influenza A by PCR NEGATIVE NEGATIVE Final   Influenza B by PCR NEGATIVE NEGATIVE Final    Comment: (NOTE) The Xpert Xpress SARS-CoV-2/FLU/RSV plus assay is intended as an aid in the diagnosis of influenza from Nasopharyngeal swab specimens and should not be used as a sole basis for treatment. Nasal washings and aspirates are unacceptable for Xpert Xpress SARS-CoV-2/FLU/RSV testing.  Fact Sheet for Patients: EntrepreneurPulse.com.au  Fact Sheet for Healthcare Providers: IncredibleEmployment.be  This test is not yet approved or cleared by the [place] FDA and has been  authorized for detection and/or diagnosis of SARS-CoV-2 by FDA under an Emergency Use Authorization (EUA). This EUA will remain in effect (meaning this test can be used) for the duration of the COVID-19 declaration under Section 564(b)(1) of the Act, 21 U.S.C. section 360bbb-3(b)(1), unless the authorization is terminated or revoked.     Resp Syncytial Virus by PCR NEGATIVE NEGATIVE Final    Comment: (NOTE) Fact Sheet for Patients: EntrepreneurPulse.com.au  Fact Sheet for Healthcare Providers: IncredibleEmployment.be  This test is not yet approved or cleared by the [place] FDA and has been authorized for detection and/or diagnosis of SARS-CoV-2 by FDA under an Emergency Use Authorization (EUA). This EUA will remain in effect (meaning this test can be used) for the duration of the COVID-19 declaration under Section 564(b)(1) of the Act, 21 U.S.C. section 360bbb-3(b)(1), unless the authorization is terminated or revoked.  Performed at [facility]  [address] [place], [place] [address]      Labs: BNP (last 3 results) No results for input(s): "BNP" in the last 8760 hours. Basic Metabolic Panel: Recent Labs  Lab 09/19/22 1841 09/20/22 0655  NA 140 140  K 3.9 4.0  CL 104 107  CO2 27 25  GLUCOSE 167* 112*  BUN 28* 24*  CREATININE 1.23 1.07  CALCIUM 8.7* 8.5*  MG  --  2.3   Liver Function Tests: Recent Labs  Lab 09/20/22 0655  AST 17  ALT 18  ALKPHOS 37*  BILITOT 0.7  PROT 6.3*  ALBUMIN 3.4*   No results for input(s): "LIPASE", "AMYLASE" in the last 168 hours. No results for input(s): "AMMONIA" in the last 168 hours. CBC: Recent Labs  Lab 09/19/22 1841 09/20/22 0655 09/20/22 1158 09/20/22 1636 09/21/22 0326  WBC 7.3 8.4  --   --   --   NEUTROABS  --  5.8  --   --   --   HGB 7.0* 8.1* 8.0* 8.4* 7.8*  HCT 22.4* 24.8* 25.4* 26.5* 25.1*  MCV 100.9* 94.3  --   --   --   PLT 195 193  --   --   --    Cardiac  Enzymes: No results for input(s): "CKTOTAL", "CKMB", "CKMBINDEX", "TROPONINI" in the last 168 hours. BNP: Invalid input(s): "POCBNP" CBG: Recent Labs  Lab 09/20/22 2340 09/21/22 0411 09/21/22 0721 09/21/22 1057 09/21/22 1137  GLUCAP 96 102* 101* 109* 120*   D-Dimer No results for input(s): "DDIMER" in the last 72 hours. Hgb A1c No results for input(s): "HGBA1C" in the last 72 hours. Lipid Profile No results for input(s): "CHOL", "HDL", "LDLCALC", "TRIG", "CHOLHDL", "LDLDIRECT" in the last 72 hours. Thyroid function studies No results for input(s): "TSH", "T4TOTAL", "T3FREE", "THYROIDAB" in the last 72 hours.  Invalid input(s): "FREET3" Anemia work up Recent Labs    09/19/22 1841  VITAMINB12 156*  FOLATE 12.6  FERRITIN 11*  TIBC 329  IRON 61  RETICCTPCT 7.0*   Urinalysis    Component Value Date/Time   COLORURINE YELLOW 05/07/2022 [place] 05/07/2022 1014   LABSPEC 1.010 05/07/2022 1014   PHURINE 6.0 05/07/2022 1014   GLUCOSEU NEGATIVE 05/07/2022 1014   [place] 05/07/2022 1014   [place] 05/07/2022 1014   KETONESUR NEGATIVE 05/07/2022 1014   PROTEINUR NEGATIVE 05/07/2022 1014   UROBILINOGEN 0.2 03/14/2014 1635   NITRITE NEGATIVE 05/07/2022 [place] 05/07/2022 1014   Sepsis Labs Recent Labs  Lab 09/19/22 1841 09/20/22 0655  WBC 7.3 8.4   Microbiology Recent Results (from the past 240 hour(s))  Resp panel by RT-PCR (RSV, Flu A&B, Covid) Anterior Nasal Swab     Status: None   Collection Time: 09/19/22 10:20 PM   Specimen: Anterior Nasal Swab  Result Value Ref Range Status   SARS Coronavirus 2 by RT PCR NEGATIVE NEGATIVE Final    Comment: (NOTE) SARS-CoV-2 target nucleic acids are NOT DETECTED.  The SARS-CoV-2 RNA is generally detectable in upper respiratory specimens during the acute phase of infection. The lowest concentration of SARS-CoV-2 viral copies this assay can detect is 138 copies/mL. A  negative result does not preclude SARS-Cov-2 infection and should not be used as the sole basis for treatment or other patient management decisions. A negative result may occur with  improper specimen collection/handling, submission of specimen other than nasopharyngeal swab, presence of viral mutation(s) within the areas targeted by this assay, and inadequate number of  viral copies(<138 copies/mL). A negative result must be combined with clinical observations, patient history, and epidemiological information. The expected result is Negative.  Fact Sheet for Patients:  EntrepreneurPulse.com.au  Fact Sheet for Healthcare Providers:  IncredibleEmployment.be  This test is no t yet approved or cleared by the [place] FDA and  has been authorized for detection and/or diagnosis of SARS-CoV-2 by FDA under an Emergency Use Authorization (EUA). This EUA will remain  in effect (meaning this test can be used) for the duration of the COVID-19 declaration under Section 564(b)(1) of the Act, 21 U.S.C.section 360bbb-3(b)(1), unless the authorization is terminated  or revoked sooner.       Influenza A by PCR NEGATIVE NEGATIVE Final   Influenza B by PCR NEGATIVE NEGATIVE Final    Comment: (NOTE) The Xpert Xpress SARS-CoV-2/FLU/RSV plus assay is intended as an aid in the diagnosis of influenza from Nasopharyngeal swab specimens and should not be used as a sole basis for treatment. Nasal washings and aspirates are unacceptable for Xpert Xpress SARS-CoV-2/FLU/RSV testing.  Fact Sheet for Patients: EntrepreneurPulse.com.au  Fact Sheet for Healthcare Providers: IncredibleEmployment.be  This test is not yet approved or cleared by the [place] FDA and has been authorized for detection and/or diagnosis of SARS-CoV-2 by FDA under an Emergency Use Authorization (EUA). This EUA will remain in effect (meaning this test can  be used) for the duration of the COVID-19 declaration under Section 564(b)(1) of the Act, 21 U.S.C. section 360bbb-3(b)(1), unless the authorization is terminated or revoked.     Resp Syncytial Virus by PCR NEGATIVE NEGATIVE Final    Comment: (NOTE) Fact Sheet for Patients: EntrepreneurPulse.com.au  Fact Sheet for Healthcare Providers: IncredibleEmployment.be  This test is not yet approved or cleared by the [place] FDA and has been authorized for detection and/or diagnosis of SARS-CoV-2 by FDA under an Emergency Use Authorization (EUA). This EUA will remain in effect (meaning this test can be used) for the duration of the COVID-19 declaration under Section 564(b)(1) of the Act, 21 U.S.C. section 360bbb-3(b)(1), unless the authorization is terminated or revoked.  Performed at [facility], [address] [place], [place] [address]      Time coordinating discharge: Over 30 minutes  SIGNED:   Shawna Clamp, MD  [facility] 09/21/2022, 1:52 PM Pager   If 7PM-7AM, please contact night-coverage

## 2022-09-21 NOTE — Care Management Important Message (Signed)
Important Message  Patient Details  Name: [name] MRN: [id number] Date of Birth: 1946/03/11   Medicare Important Message Given:  Yes     Tommy Medal 09/21/2022, 11:59 AM

## 2022-09-21 NOTE — Anesthesia Preprocedure Evaluation (Signed)
Anesthesia Evaluation  Patient identified by MRN, date of birth, ID band Patient awake    Reviewed: Allergy & Precautions, H&P , NPO status , Patient's Chart, lab work & pertinent test results  Airway Mallampati: II  TM Distance: >3 FB Neck ROM: Limited   Comment: Cervical fusion Dental  (+) Dental Advisory Given, Edentulous Upper, Missing   Pulmonary sleep apnea and Continuous Positive Airway Pressure Ventilation , pneumonia, Current Smoker and Patient abstained from smoking.   Pulmonary exam normal breath sounds clear to auscultation       Cardiovascular Exercise Tolerance: Poor hypertension, Pt. on medications + CAD and + Peripheral Vascular Disease  Normal cardiovascular exam+ Valvular Problems/Murmurs  Rhythm:Regular Rate:Normal     Neuro/Psych negative neurological ROS  negative psych ROS   GI/Hepatic negative GI ROS, Neg liver ROS,,,  Endo/Other  diabetes, Well Controlled, Type 2, Oral Hypoglycemic Agents    Renal/GU Renal disease Bladder dysfunction (prostate and bladder cancer)      Musculoskeletal  (+) Arthritis , Osteoarthritis,    Abdominal   Peds negative pediatric ROS (+)  Hematology  (+) Blood dyscrasia, anemia   Anesthesia Other Findings   Reproductive/Obstetrics negative OB ROS                             Anesthesia Physical Anesthesia Plan  ASA: 3  Anesthesia Plan: General   Post-op Pain Management: Minimal or no pain anticipated   Induction: Intravenous  PONV Risk Score and Plan: 1 and Propofol infusion  Airway Management Planned: Nasal Cannula, Natural Airway and Simple Face Mask  Additional Equipment:   Intra-op Plan:   Post-operative Plan:   Informed Consent: I have reviewed the patients History and Physical, chart, labs and discussed the procedure including the risks, benefits and alternatives for the proposed anesthesia with the patient or  authorized representative who has indicated his/her understanding and acceptance.     Dental advisory given  Plan Discussed with: CRNA and Surgeon  Anesthesia Plan Comments: (In case of cardiac arrest, do not do any prolonged resuscitation procedures )        Anesthesia Quick Evaluation

## 2022-09-21 NOTE — Anesthesia Postprocedure Evaluation (Signed)
Anesthesia Post Note  Patient: Srihaan R Joubert  Procedure(s) Performed: ESOPHAGOGASTRODUODENOSCOPY (EGD) WITH PROPOFOL COLONOSCOPY WITH PROPOFOL BIOPSY POLYPECTOMY HEMOSTASIS CLIP PLACEMENT  Patient location during evaluation: Phase II Anesthesia Type: General Level of consciousness: awake and alert and oriented Pain management: pain level controlled Vital Signs Assessment: post-procedure vital signs reviewed and stable Respiratory status: spontaneous breathing, nonlabored ventilation and respiratory function stable Cardiovascular status: blood pressure returned to baseline and stable Postop Assessment: no apparent nausea or vomiting Anesthetic complications: no  No notable events documented.   Last Vitals:  Vitals:   09/21/22 0942 09/21/22 1100  BP: 129/79 (!) 100/52  Pulse: 72 71  Resp: 20 (!) 21  Temp: 36.8 C 36.9 C  SpO2: 97% 95%    Last Pain:  Vitals:   09/21/22 1100  TempSrc:   PainSc: 0-No pain                 Zaylon Bossier C Loyda Costin

## 2022-09-21 NOTE — Progress Notes (Signed)
Tap Enemas administered as ordered. Patient tolerated well. Patient consumed entire 4L of nulytely. Stools are now clear and free of stool. Patient appropriately prepped for EGD and colonoscopy. NPO since MN. VS stable. Consent in chart.

## 2022-09-21 NOTE — Interval H&P Note (Signed)
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  The various methods of treatment have been discussed with the patient and family. After consideration of risks, benefits and other options for treatment, the patient has consented to  Procedure(s): ESOPHAGOGASTRODUODENOSCOPY (EGD) WITH PROPOFOL (N/A) COLONOSCOPY WITH PROPOFOL (N/A) as a surgical intervention.  The patient's history has been reviewed, patient examined, no change in status, stable for surgery.  I have reviewed the patient's chart and labs.  Questions were answered to the patient's satisfaction.       Patient seen in short   Stay..  Hemoglobin 7.8 this morning.  Remains hemodynamically stable.    Received 1 unit of packed RBCs.  He denies dysphagia.  He would need for both EGD and colonoscopy.  His risk of > biopsy polypectomy while on aspirin and Plavix.  The risks, benefits, limitations, imponderables and alternatives regarding both EGD and colonoscopy have been reviewed with the patient. Questions have been answered. All parties agreeable.     Manus Rudd

## 2022-09-21 NOTE — Op Note (Addendum)
[facility] Patient Name: [name] Procedure Date: 09/21/2022 9:53 AM MRN: [id number] Date of Birth: Jan 27, 1946 Attending MD: Norvel Richards , MD, [id number] CSN: [id number] Age: 77 Admit Type: Inpatient Procedure:                Upper GI endoscopy Indications:              Melena Providers:                Norvel Richards, MD, Lambert Mody,                            Kristine L. Risa Grill, Technician Referring MD:              Medicines:                Propofol per Anesthesia Complications:            No immediate complications. Estimated Blood Loss:     Estimated blood loss was minimal. Procedure:                Pre-Anesthesia Assessment:                           - Prior to the procedure, a History and Physical                            was performed, and patient medications and                            allergies were reviewed. The patient's tolerance of                            previous anesthesia was also reviewed. The risks                            and benefits of the procedure and the sedation                            options and risks were discussed with the patient.                            All questions were answered, and informed consent                            was obtained. Prior Anticoagulants: The patient has                            taken no anticoagulant or antiplatelet agents. ASA                            Grade Assessment: III - A patient with severe                            systemic disease. After reviewing the risks and  benefits, the patient was deemed in satisfactory                            condition to undergo the procedure.                           After obtaining informed consent, the endoscope was                            passed under direct vision. Throughout the                            procedure, the patient's blood pressure, pulse, and                            oxygen  saturations were monitored continuously. The                            [id number]-[id number] ([id number]) scope was introduced through the                            mouth, and advanced to the second part of duodenum.                            The upper GI endoscopy was accomplished without                            difficulty. The patient tolerated the procedure                            well. Scope In: 10:15:37 AM Scope Out: 10:20:13 AM Total Procedure Duration: 0 hours 4 minutes 36 seconds  Findings:      The examined esophagus was normal. Gastric cavity empty. Reticulated,       mottled appearance of the gastric mucosa diffusely. No ulcer or       infiltrating process seen. Patent pylorus.      The duodenal bulb and second portion of the duodenum were normal. The       abnormal gastric mucosa was biopsied for histologic study. Impression:               - Normal esophagus. Abnormal gastric mucosa of                            uncertain significance as described above - status                            post biopsy.                           - Normal duodenal bulb and second portion of the                            duodenum. Moderate Sedation:      Moderate (conscious) sedation was personally administered by an       anesthesia professional.  The following parameters were monitored: oxygen       saturation, heart rate, blood pressure, respiratory rate, EKG, adequacy       of pulmonary ventilation, and response to care. Recommendation:           - Return patient to hospital ward for observation.                            See colonoscopy report.                           - Continue present medications. Procedure Code(s):        --- Professional ---                           [phone], Esophagogastroduodenoscopy, flexible,                            transoral; diagnostic, including collection of                            specimen(s) by brushing or washing, when performed                             (separate procedure) Diagnosis Code(s):        --- Professional ---                           K92.1, Melena (includes Hematochezia) CPT copyright 2022 American Medical Association. All rights reserved. The codes documented in this report are preliminary and upon coder review may  be revised to meet current compliance requirements. [name]. Lelon Ikard, MD Norvel Richards, MD 09/21/2022 10:24:58 AM This report has been signed electronically. Number of Addenda: 0

## 2022-09-21 NOTE — Anesthesia Procedure Notes (Signed)
Procedure Name: MAC Date/Time: 09/21/2022 10:18 AM  Performed by: Lieutenant Diego, CRNAPre-anesthesia Checklist: Patient identified, Emergency Drugs available, Suction available, Patient being monitored and Timeout performed Patient Re-evaluated:Patient Re-evaluated prior to induction Oxygen Delivery Method: Nasal cannula Preoxygenation: Pre-oxygenation with 100% oxygen Induction Type: IV induction

## 2022-09-21 NOTE — Op Note (Signed)
[facility] Patient Name: [name] Procedure Date: 09/21/2022 10:22 AM MRN: [id number] Date of Birth: [birth year] Attending MD: Norvel Richards , MD, [id number] CSN: [id number] Age: 77 Admit Type: Inpatient Procedure:                Colonoscopy Indications:              Hematochezia Providers:                Norvel Richards, MD, Lambert Mody,                            Kristine L. Risa Grill, Technician Referring MD:              Medicines:                Propofol per Anesthesia Complications:            No immediate complications. Estimated Blood Loss:     Estimated blood loss was minimal. Procedure:                Pre-Anesthesia Assessment:                           - Prior to the procedure, a History and Physical                            was performed, and patient medications and                            allergies were reviewed. The patient's tolerance of                            previous anesthesia was also reviewed. The risks                            and benefits of the procedure and the sedation                            options and risks were discussed with the patient.                            All questions were answered, and informed consent                            was obtained. Prior Anticoagulants: The patient has                            taken no anticoagulant or antiplatelet agents. ASA                            Grade Assessment: III - A patient with severe                            systemic disease. After reviewing the risks and  benefits, the patient was deemed in satisfactory                            condition to undergo the procedure.                           After obtaining informed consent, the colonoscope                            was passed under direct vision. Throughout the                            procedure, the patient's blood pressure, pulse, and                            oxygen  saturations were monitored continuously. The                            [id number]) scope was introduced through                            the anus and advanced to the the cecum, identified                            by appendiceal orifice and ileocecal valve. The                            colonoscopy was performed without difficulty. The                            patient tolerated the procedure well. The quality                            of the bowel preparation was adequate. The                            ileocecal valve, appendiceal orifice, and rectum                            were photographed. The entire colon was well                            visualized. Scope In: 10:26:02 AM Scope Out: 10:48:51 AM Scope Withdrawal Time: 0 hours 17 minutes 55 seconds  Total Procedure Duration: 0 hours 22 minutes 49 seconds  Findings:      The perianal and digital rectal examinations were normal.      Multiple large-mouthed diverticula were found in the sigmoid colon,       descending colon and splenic flexure.      A 10 mm polyp was found in the cecum. The polyp was semi-pedunculated.       The polyp was removed with a cold snare. Resection and retrieval were       complete. Estimated blood loss was minimal.      The exam was otherwise without abnormality on direct and  retroflexion       views. Impression:               - Diverticulosis in the sigmoid colon, in the                            descending colon and at the splenic flexure.                           - One 10 mm polyp in the cecum, removed with a cold                            snare. Resected and retrieved.                           - The examination was otherwise normal on direct                            and retroflexion views. Cannot rule out a                            diverticular bleed here. Moderate Sedation:      Moderate (conscious) sedation was personally administered by an       anesthesia professional.  The following parameters were monitored: oxygen       saturation, heart rate, blood pressure, respiratory rate, EKG, adequacy       of pulmonary ventilation, and response to care. Recommendation:           - Patient has a contact number available for                            emergencies. The signs and symptoms of potential                            delayed complications were discussed with the                            patient. Return to normal activities tomorrow.                            Written discharge instructions were provided to the                            patient.                           - Advance diet as tolerated.                           - Return patient to hospital ward for observation.                           - Advance diet as tolerated.                           - Repeat colonoscopy after studies are complete  for                            surveillance based on pathology results.                           - Return to GI office (date not yet determined). Procedure Code(s):        --- Professional ---                           [phone], Colonoscopy, flexible; with removal of                            tumor(s), polyp(s), or other lesion(s) by snare                            technique Diagnosis Code(s):        --- Professional ---                           D12.0, Benign neoplasm of cecum                           K92.1, Melena (includes Hematochezia)                           K57.30, Diverticulosis of large intestine without                            perforation or abscess without bleeding CPT copyright 2022 American Medical Association. All rights reserved. The codes documented in this report are preliminary and upon coder review may  be revised to meet current compliance requirements. [name]. Taelyn Nemes, MD Norvel Richards, MD 09/21/2022 11:06:02 AM This report has been signed electronically. Number of Addenda: 0

## 2022-09-21 NOTE — Transfer of Care (Signed)
Immediate Anesthesia Transfer of Care Note  Patient: [name]  Procedure(s) Performed: ESOPHAGOGASTRODUODENOSCOPY (EGD) WITH PROPOFOL COLONOSCOPY WITH PROPOFOL BIOPSY POLYPECTOMY  Patient Location: PACU  Anesthesia Type:MAC  Level of Consciousness: drowsy  Airway & Oxygen Therapy: Patient Spontanous Breathing and Patient connected to nasal cannula oxygen  Post-op Assessment: Report given to RN and Post -op Vital signs reviewed and stable  Post vital signs: Reviewed and stable  Last Vitals:  Vitals Value Taken Time  BP    Temp    Pulse 75 09/21/22 1052  Resp    SpO2 98 % 09/21/22 1052  Vitals shown include unvalidated device data.  Last Pain:  Vitals:   09/21/22 1011  TempSrc:   PainSc: 3       Patients Stated Pain Goal: 3 (88/30/14 1597)  Complications: No notable events documented.

## 2022-09-24 ENCOUNTER — Other Ambulatory Visit: Payer: Self-pay

## 2022-09-24 DIAGNOSIS — I6523 Occlusion and stenosis of bilateral carotid arteries: Secondary | ICD-10-CM

## 2022-09-25 LAB — SURGICAL PATHOLOGY

## 2022-09-26 ENCOUNTER — Encounter: Payer: Self-pay | Admitting: Internal Medicine

## 2022-09-26 ENCOUNTER — Encounter (HOSPITAL_COMMUNITY): Payer: Self-pay | Admitting: Internal Medicine

## 2022-09-27 ENCOUNTER — Other Ambulatory Visit: Payer: Self-pay

## 2022-09-27 ENCOUNTER — Telehealth: Payer: Self-pay

## 2022-09-27 MED ORDER — BISMUTH 262 MG PO CHEW: 2.0000 | CHEWABLE_TABLET | Freq: Three times a day (TID) | ORAL | 0 refills | Status: DC

## 2022-09-27 MED ORDER — PANTOPRAZOLE SODIUM 40 MG PO TBEC: 40.0000 mg | DELAYED_RELEASE_TABLET | Freq: Two times a day (BID) | ORAL | 0 refills | Status: DC

## 2022-09-27 MED ORDER — TETRACYCLINE HCL 500 MG PO CAPS: 500.0000 mg | ORAL_CAPSULE | Freq: Three times a day (TID) | ORAL | 0 refills | Status: DC

## 2022-09-27 MED ORDER — METRONIDAZOLE 500 MG PO TABS: 500.0000 mg | ORAL_TABLET | Freq: Three times a day (TID) | ORAL | 0 refills | Status: DC

## 2022-09-27 NOTE — Telephone Encounter (Signed)
Pt was made aware and verbalized understanding.   Amanda: Please cancel the appt for 1/29 with Magda Paganini and reschedule it with one of the APPS in 3 mths for f/u on h pylori.

## 2022-09-27 NOTE — Telephone Encounter (Signed)
Rx has been sent to pharmacy on file. Lmom for pt to return my call.

## 2022-09-27 NOTE — Telephone Encounter (Signed)
[name], [name], MD  Orland Jarred M, CMA  Lamone Ferrelli, patient needs to be treated for H. pylori.  Pylera 3 capsules 4 times a day x 14 days.  Protonix 40 mg twice daily x 14 days.  No refills on the above.  Need office visit with app in 3 months to reassess and document eradication

## 2022-09-28 ENCOUNTER — Encounter: Payer: Self-pay | Admitting: Gastroenterology

## 2022-10-15 ENCOUNTER — Inpatient Hospital Stay: Payer: Medicare HMO | Admitting: Gastroenterology

## 2022-11-22 ENCOUNTER — Telehealth: Payer: Self-pay

## 2022-11-22 NOTE — Telephone Encounter (Signed)
Noted. Reviewed labs. Hgb improved at 9.4. await ov.

## 2022-11-22 NOTE — Telephone Encounter (Signed)
FYI: Pt has an appt with you on 12/26/2022. Recent labs from Dr. Nevada Crane are scanned into chart under media for review.

## 2022-12-26 ENCOUNTER — Encounter: Payer: Self-pay | Admitting: Gastroenterology

## 2022-12-26 ENCOUNTER — Ambulatory Visit (INDEPENDENT_AMBULATORY_CARE_PROVIDER_SITE_OTHER): Payer: Medicare HMO | Admitting: Gastroenterology

## 2022-12-26 VITALS — BP 122/58 | HR 76 | Temp 97.9°F | Ht 67.0 in | Wt 177.2 lb

## 2022-12-26 DIAGNOSIS — D500 Iron deficiency anemia secondary to blood loss (chronic): Secondary | ICD-10-CM | POA: Diagnosis not present

## 2022-12-26 DIAGNOSIS — B9681 Helicobacter pylori [H. pylori] as the cause of diseases classified elsewhere: Secondary | ICD-10-CM

## 2022-12-26 DIAGNOSIS — K2970 Gastritis, unspecified, without bleeding: Secondary | ICD-10-CM | POA: Diagnosis not present

## 2022-12-26 DIAGNOSIS — D509 Iron deficiency anemia, unspecified: Secondary | ICD-10-CM | POA: Insufficient documentation

## 2022-12-26 NOTE — Progress Notes (Signed)
GI Office Note    Referring Provider: Benita Stabile, MD Primary Care Physician:  Benita Stabile, MD  Primary Gastroenterologist: Hennie Duos. Marletta Lor, DO   Chief Complaint   Chief Complaint  Patient presents with   Follow-up    States that he having dizzy spells first thing in the morning since procedure.    History of Present Illness   [name] is a 77 y.o. male presenting today for hospital follow-up.  Patient with history of carotid artery stent placed August 2023, CAD, bladder cancer, prostate cancer, diabetes, hypertension.    Hospitalized back in January with hemoglobin of 7 (down from 11 in August 2023), Hemoccult positive stool.  Ferritin 11.  B12 156.  Required 1 unit of packed red blood cells, hemoglobin up to 8.1 posttransfusion. Patient had been on aspirin and Plavix for history of stent.  Chronically on Celebrex.  Reported black mushy stools for a while.  EGD/colonoscopy January 2024 with H. pylori gastritis. One 10mm polyp in the cecum removed, tubular adenoma.  3-year surveillance colonoscopy recommended.  At time of discharge his hemoglobin was 7.8.  Recent hemoglobin 4 weeks ago up to 9.4. H.pylori treated with Pylera and pantoprazole.  Today: Feels dizzy first 15 minutes of the day. Staggers around the house, similar to when he presented with anemia in 09/2022. Taking B12 pills and iron. Chose to stay on Celebrex, twice a day. States he cannot function without it. Has been taking it for years. Does not tolerate pain pills. Has to take care of house due to wife's stage 4 lung cancer.No melena, brbpr. Laxative every day, BM daily, has to avoid straining, due to history of CSF leak to nose. No heartburn. He did not seem sure whether he was still on pantoprazole.    Medications   Current Outpatient Medications  Medication Sig Dispense Refill   acetaminophen (TYLENOL) 500 MG tablet Take 1,000 mg by mouth every 6 (six) hours as needed for moderate pain.      albuterol (VENTOLIN HFA) 108 (90 Base) MCG/ACT inhaler Inhale TWO puffs into THE lungs every FOUR hours AS DIRECTED     allopurinol (ZYLOPRIM) 300 MG tablet Take 300 mg by mouth at bedtime.     amLODipine (NORVASC) 10 MG tablet TAKE 1 TABLET BY MOUTH EVERY DAY (Patient taking differently: Take 10 mg by mouth daily.) 90 tablet 3   aspirin 81 MG EC tablet Take 81 mg by mouth at bedtime.     celecoxib (CELEBREX) 200 MG capsule Take 200 mg by mouth 2 (two) times daily.     clopidogrel (PLAVIX) 75 MG tablet Take 75 mg by mouth daily.   6   colchicine 0.6 MG tablet Take 0.6 mg by mouth 2 (two) times daily as needed (gout).      cyanocobalamin (VITAMIN B12) 1000 MCG tablet Take 1 tablet (1,000 mcg total) by mouth daily. 30 tablet 1   docusate sodium (COLACE) 100 MG capsule Take 100 mg by mouth at bedtime.     finasteride (PROSCAR) 5 MG tablet Take 5 mg by mouth at bedtime.     furosemide (LASIX) 40 MG tablet Take 40 mg by mouth every morning.     gabapentin (NEURONTIN) 100 MG capsule Take 300 mg by mouth 2 (two) times daily.     Iron-FA-B Cmp-C-Biot-Probiotic (FUSION PLUS) CAPS Take 1 capsule by mouth daily.     labetalol (NORMODYNE) 300 MG tablet Take 100 mg by mouth 2 (two) times daily.  losartan (COZAAR) 100 MG tablet Take 1 tablet (100 mg total) by mouth every evening. 30 tablet 6   metFORMIN (GLUCOPHAGE-XR) 500 MG 24 hr tablet Take 1,000 mg by mouth in the morning and at bedtime.     Omega-3-6-9 CAPS Take 1 capsule by mouth 2 (two) times daily.     pantoprazole (PROTONIX) 40 MG tablet Take 1 tablet (40 mg total) by mouth 2 (two) times daily for 14 days. (Patient taking differently: Take 40 mg by mouth daily.) 28 tablet 0   polyethylene glycol powder (GLYCOLAX/MIRALAX) powder TAKE 17MG (1CAPFUL) BY MOUTH(MIXED WITH WATER/JUICE) DAILY (Patient taking differently: Take 17 g by mouth at bedtime.) 527 g 0   rosuvastatin (CRESTOR) 10 MG tablet Take 10 mg by mouth every morning.      tamsulosin (FLOMAX)  0.4 MG CAPS capsule Take 0.4 mg by mouth daily.     Vitamin D, Ergocalciferol, (DRISDOL) 1.25 MG (50000 UNIT) CAPS capsule Take 50,000 Units by mouth every Thursday.     No current facility-administered medications for this visit.    Allergies   Allergies as of 12/26/2022 - Review Complete 12/26/2022  Allergen Reaction Noted   Dilaudid [hydromorphone hcl] Other (See Comments) 04/06/2014   Lyrica [pregabalin] Nausea Only and Other (See Comments)    Toradol [ketorolac tromethamine] Other (See Comments) 02/04/2014   Tramadol  05/02/2022    Review of Systems   General: Negative for anorexia, weight loss, fever, chills, fatigue, weakness. See hpi ENT: Negative for hoarseness, difficulty swallowing , nasal congestion. CV: Negative for chest pain, angina, palpitations, dyspnea on exertion, peripheral edema.  Respiratory: Negative for dyspnea at rest, dyspnea on exertion, cough, sputum, wheezing.  GI: See history of present illness. GU:  Negative for dysuria, hematuria, urinary incontinence, urinary frequency, nocturnal urination.  Endo: Negative for unusual weight change.     Physical Exam   BP (!) 122/58 (BP Location: Right Arm, Patient Position: Sitting, Cuff Size: Normal)   Pulse 76   Temp 97.9 F (36.6 C) (Oral)   Ht 5\' 7"  (1.702 m)   Wt 177 lb 3.2 oz (80.4 kg)   SpO2 98%   BMI 27.75 kg/m    General: Well-nourished, well-developed in no acute distress.  Eyes: No icterus. Mouth: Oropharyngeal mucosa moist and pink   Lungs: Clear to auscultation bilaterally.  Heart: Regular rate and rhythm, no murmurs rubs or gallops.  Abdomen: Bowel sounds are normal, nontender, nondistended, no hepatosplenomegaly or masses,  no abdominal bruits or hernia , no rebound or guarding. Liver edge easily palpated in right upper quadrant Rectal: not performed Extremities: No lower extremity edema. No clubbing or deformities. Neuro: Alert and oriented x 4   Skin: Warm and dry, no jaundice.    Psych: Alert and cooperative, normal mood and affect.  Labs   See hpi  Imaging Studies   No results found.  Assessment   IDA/GI bleed/H.pylori:  in setting of plavix/asa/celebrex use. Found to have h.pylori gastritis, colon polyp. Has completed treatment for h.pylori. we will need to verify eradication. Patient with symptoms concerning for symptomatic anemia. Will recheck labs.   ?hepatomegaly: liver edge palpated easily in right flank.   PLAN    Will need to verify if on pantoprazole or other acid reflux medication. Will need to hold for two weeks before checking H.pylori stool antigen. Update labs.  Consider abdominal u/s.   Leanna Battles. Melvyn Neth, MHS, PA-C [facility]

## 2022-12-26 NOTE — Patient Instructions (Signed)
Please verify whether or not you are taking pantoprazole or any other acid reflux medications.  You will need to collect stool specimen to make sure the antibiotics treated the stomach infection (H.pylori). You cannot be on any acid reflux medications, pantoprazole, or antibiotics for two weeks before collect the stool specimen. Go to the lab today to check your blood counts and pick up stool container.  We will be in touch with results.

## 2022-12-27 LAB — CBC WITH DIFFERENTIAL/PLATELET
Absolute Monocytes: 470 cells/uL (ref 200–950)
Basophils Absolute: 78 cells/uL (ref 0–200)
Basophils Relative: 1.4 %
Eosinophils Absolute: 263 cells/uL (ref 15–500)
Eosinophils Relative: 4.7 %
HCT: 32.8 % — ABNORMAL LOW (ref 38.5–50.0)
Hemoglobin: 10.5 g/dL — ABNORMAL LOW (ref 13.2–17.1)
Lymphs Abs: 1187 cells/uL (ref 850–3900)
MCH: 29.1 pg (ref 27.0–33.0)
MCHC: 32 g/dL (ref 32.0–36.0)
MCV: 90.9 fL (ref 80.0–100.0)
MPV: 11.5 fL (ref 7.5–12.5)
Monocytes Relative: 8.4 %
Neutro Abs: 3601 cells/uL (ref 1500–7800)
Neutrophils Relative %: 64.3 %
Platelets: 180 10*3/uL (ref 140–400)
RBC: 3.61 10*6/uL — ABNORMAL LOW (ref 4.20–5.80)
RDW: 17.4 % — ABNORMAL HIGH (ref 11.0–15.0)
Total Lymphocyte: 21.2 %
WBC: 5.6 10*3/uL (ref 3.8–10.8)

## 2022-12-27 LAB — BASIC METABOLIC PANEL
BUN/Creatinine Ratio: 20 (calc) (ref 6–22)
BUN: 29 mg/dL — ABNORMAL HIGH (ref 7–25)
CO2: 32 mmol/L (ref 20–32)
Calcium: 9.5 mg/dL (ref 8.6–10.3)
Chloride: 106 mmol/L (ref 98–110)
Creat: 1.45 mg/dL — ABNORMAL HIGH (ref 0.70–1.28)
Glucose, Bld: 152 mg/dL — ABNORMAL HIGH (ref 65–139)
Potassium: 4.9 mmol/L (ref 3.5–5.3)
Sodium: 146 mmol/L (ref 135–146)

## 2022-12-27 LAB — FERRITIN: Ferritin: 17 ng/mL — ABNORMAL LOW (ref 24–380)

## 2022-12-31 LAB — HELICOBACTER PYLORI  SPECIAL ANTIGEN
MICRO NUMBER:: 14821741
SPECIMEN QUALITY: ADEQUATE

## 2023-01-09 ENCOUNTER — Other Ambulatory Visit: Payer: Self-pay

## 2023-01-09 DIAGNOSIS — D500 Iron deficiency anemia secondary to blood loss (chronic): Secondary | ICD-10-CM

## 2023-01-10 ENCOUNTER — Other Ambulatory Visit: Payer: Self-pay | Admitting: *Deleted

## 2023-01-10 DIAGNOSIS — R198 Other specified symptoms and signs involving the digestive system and abdomen: Secondary | ICD-10-CM

## 2023-01-15 LAB — BASIC METABOLIC PANEL
BUN: 25 mg/dL (ref 7–25)
CO2: 29 mmol/L (ref 20–32)
Calcium: 9.5 mg/dL (ref 8.6–10.3)
Chloride: 106 mmol/L (ref 98–110)
Creat: 1.04 mg/dL (ref 0.70–1.28)
Glucose, Bld: 129 mg/dL — ABNORMAL HIGH (ref 65–99)
Potassium: 4.3 mmol/L (ref 3.5–5.3)
Sodium: 141 mmol/L (ref 135–146)

## 2023-01-24 ENCOUNTER — Ambulatory Visit (HOSPITAL_COMMUNITY)
Admission: RE | Admit: 2023-01-24 | Discharge: 2023-01-24 | Disposition: A | Discharge status: Home / Self Care | Payer: Medicare HMO | Source: Ambulatory Visit | Attending: Gastroenterology | Admitting: Gastroenterology

## 2023-01-24 DIAGNOSIS — R198 Other specified symptoms and signs involving the digestive system and abdomen: Secondary | ICD-10-CM | POA: Diagnosis present

## 2023-02-20 ENCOUNTER — Encounter: Payer: Self-pay | Admitting: Cardiology

## 2023-02-20 ENCOUNTER — Ambulatory Visit: Payer: Medicare HMO | Attending: Cardiology | Admitting: Cardiology

## 2023-02-20 VITALS — BP 144/58 | HR 74 | Ht 67.0 in | Wt 176.2 lb

## 2023-02-20 DIAGNOSIS — I10 Essential (primary) hypertension: Secondary | ICD-10-CM

## 2023-02-20 DIAGNOSIS — I2510 Atherosclerotic heart disease of native coronary artery without angina pectoris: Secondary | ICD-10-CM

## 2023-02-20 DIAGNOSIS — E782 Mixed hyperlipidemia: Secondary | ICD-10-CM

## 2023-02-20 DIAGNOSIS — I6523 Occlusion and stenosis of bilateral carotid arteries: Secondary | ICD-10-CM

## 2023-02-20 DIAGNOSIS — I350 Nonrheumatic aortic (valve) stenosis: Secondary | ICD-10-CM

## 2023-02-20 NOTE — Progress Notes (Signed)
Clinical Summary Mr. Cheatom is a 77 y.o.male seen today for follow up of the following medical problems.      1. HTN -compliant with meds - dizzy spells have resolved with spreading out bp meds throughout the day  -low bp's last week - reports bp 90s/40-50s usually mid day. Did have an episode of syncope.      2. Carotid stenosis - RICA >70%, LICA <50%.  - has been on ASA and plavix, presume due to his history cerebrovascular disease  12/2021 RICA 80-99%, LICA 60-79%    04/2022 ICA stenting with Dr Randie Heinz 05/2022 RICA mild, LICA 60-79% - continues to follow with vascular  - up coming next month with vascular   3. Hyperlipidemia - compliant with statin - reports most recent labs with pcp  11/2022 TC 117 TG 99 HDL 47 LDL 51   4. OSA - compliant with CPAP - followed by Dr Vassie Loll   5. Prostate CA - history of prior seed implant, reports cancer free   6. CAD - incidental findings form prior chest CT in 2013. The patient has not had symptoms of angina - 05/2017 nuclear stress: possible mild inferoseptal ischemia vs artifact, low risk   - no chest pains, no SOB/DOE   7. Moderate aortic aortic stenosis - 08/2021 echo moderate AS mean 27 mmHg, AVA VTI 1.05  07/2022 echo: LVEF 60-65%, moderate AS AVA VTI 1.15 mean grad 20   8. AAA screen - male over 29 former smoker - 02/2014 CT showed no aneurysm  9. GI bleed - admit Jan 2024 with symptomatic anemia, GI bleed - Hgb 7, received 1 unit pRBCs - EGD mottled gastric mucosa no clear ulcer or bleeding source, colon divetriculi no clear bleed - he was on ASA/plavix/celebrex at the time. Found to have hpylori gastiritis, completed treatment  10. Chronic back pain - on celebrex, has not been able to wean.  Past Medical History:  Diagnosis Date   Bilateral renal cysts    Bladder cancer (HCC) UROLOGIST-  DR [facility]   RECURRENT 11/ 2017 --  hx turbt in 2004 and 08-19-2013   BPH without obstruction/lower urinary tract symptoms     Cervical fusion syndrome    LEFT ARM NUMBNESS--  CONTROLLED W/ GABAPENTIN   Coronary artery disease    CARDIOLOGIST-- DR Wyline Mood Lorelle Formosa)   DDD (degenerative disc disease), cervical    DDD (degenerative disc disease), lumbar    Diverticulosis of sigmoid colon    Heart murmur    slight murmur per patient   History of acute gouty arthritis    History of adenomatous polyp of colon    2001;  2012- non-malignant leiomyoma;  04-07-2014  tubualr adenoma and hyperplastic polyp's   History of prostate cancer UROLOGIST-  DR [facility]   S/P  RADIOACTIVE SEED IMPLANTS 2004   HOH (hard of hearing)    no hearing aids   Hyperlipidemia    Hypertension    OA (osteoarthritis)    Occlusion and stenosis of carotid artery without mention of cerebral infarction CARDIOLOGIST  -- DR  Christiane Ha Sherion Dooly   LAST DUPLEX  11-23-2015  RICA  (DUPLEX DOPPLER 01-20-2016 RICA 60-79% PROXIMAL) /   LICA <50% (HAD CONSULT W/ DR Imogene Burn , VASCULAR SURGEON 01-20-2016)   OSA on CPAP    BiPAP   Prostate cancer (HCC)    per patient "years ago"- had radiation implant   Spondylosis, cervical    Type 2 diabetes mellitus (HCC)  Wears glasses      Allergies  Allergen Reactions   Dilaudid [Hydromorphone Hcl] Other (See Comments)    "acts a little off"   Lyrica [Pregabalin] Nausea Only and Other (See Comments)    Make me feel "bad"   Toradol [Ketorolac Tromethamine] Other (See Comments)    CauseD  hallucination ?-states not sure/ avoids per his MD   Tramadol     Couldn't sleep     Current Outpatient Medications  Medication Sig Dispense Refill   acetaminophen (TYLENOL) 500 MG tablet Take 1,000 mg by mouth every 6 (six) hours as needed for moderate pain.     albuterol (VENTOLIN HFA) 108 (90 Base) MCG/ACT inhaler Inhale TWO puffs into THE lungs every FOUR hours AS DIRECTED     allopurinol (ZYLOPRIM) 300 MG tablet Take 300 mg by mouth at bedtime.     amLODipine (NORVASC) 10 MG tablet TAKE 1 TABLET BY MOUTH EVERY  DAY (Patient taking differently: Take 10 mg by mouth daily.) 90 tablet 3   aspirin 81 MG EC tablet Take 81 mg by mouth at bedtime.     celecoxib (CELEBREX) 200 MG capsule Take 200 mg by mouth 2 (two) times daily.     clopidogrel (PLAVIX) 75 MG tablet Take 75 mg by mouth daily.   6   colchicine 0.6 MG tablet Take 0.6 mg by mouth 2 (two) times daily as needed (gout).      cyanocobalamin (VITAMIN B12) 1000 MCG tablet Take 1 tablet (1,000 mcg total) by mouth daily. 30 tablet 1   docusate sodium (COLACE) 100 MG capsule Take 100 mg by mouth at bedtime.     finasteride (PROSCAR) 5 MG tablet Take 5 mg by mouth at bedtime.     furosemide (LASIX) 40 MG tablet Take 40 mg by mouth every morning.     gabapentin (NEURONTIN) 100 MG capsule Take 300 mg by mouth 2 (two) times daily.     Iron-FA-B Cmp-C-Biot-Probiotic (FUSION PLUS) CAPS Take 1 capsule by mouth daily.     labetalol (NORMODYNE) 100 MG tablet Take 100 mg by mouth 2 (two) times daily.     losartan (COZAAR) 100 MG tablet Take 1 tablet (100 mg total) by mouth every evening. 30 tablet 6   metFORMIN (GLUCOPHAGE-XR) 500 MG 24 hr tablet Take 1,000 mg by mouth in the morning and at bedtime.     Omega-3-6-9 CAPS Take 1 capsule by mouth 2 (two) times daily.     pantoprazole (PROTONIX) 40 MG tablet Take 1 tablet (40 mg total) by mouth 2 (two) times daily for 14 days. (Patient taking differently: Take 40 mg by mouth daily.) 28 tablet 0   polyethylene glycol powder (GLYCOLAX/MIRALAX) powder TAKE 17MG (1CAPFUL) BY MOUTH(MIXED WITH WATER/JUICE) DAILY (Patient taking differently: Take 17 g by mouth at bedtime.) 527 g 0   rosuvastatin (CRESTOR) 10 MG tablet Take 10 mg by mouth every morning.      tamsulosin (FLOMAX) 0.4 MG CAPS capsule Take 0.4 mg by mouth daily.     Vitamin D, Ergocalciferol, (DRISDOL) 1.25 MG (50000 UNIT) CAPS capsule Take 50,000 Units by mouth every Thursday.     No current facility-administered medications for this visit.     Past Surgical  History:  Procedure Laterality Date   ANTERIOR REMOVAL CAGE AND PLATE Z6-X0/ CORPECTOMY C7 (FX)/  ALLOGRAFT AND FUSION C3 -- T1/  POSTERIOR DECOMPRESSION BILATERAL LAMINECTOMY C4 -- 6 & PARTIAL C3/  POSTEROLATERAL ARTHRODESIS C3 - T1  06/05/2005   APPENDECTOMY  1990'S  BIOPSY  09/21/2022   Procedure: BIOPSY;  Surgeon: Corbin Ade, MD;  [facility]: [facility];  Service: Endoscopy;;   CATARACT EXTRACTION W/ INTRAOCULAR LENS  IMPLANT, BILATERAL  2013   CERVICAL FUSION  05/11/2005   C3  --  C7/  due to post op quadriparesis same day s/p  anterior C 4,5,6, colpectomy, decompression, removal epidural hematoma, foraminnotomy, cage and plate   COLONOSCOPY  last one 04-07-2014   COLONOSCOPY WITH PROPOFOL N/A 09/21/2022   Procedure: COLONOSCOPY WITH PROPOFOL;  Surgeon: Corbin Ade, MD;  Location: [facility];  Service: Endoscopy;  Laterality: N/A;   CYSTOSCOPY W/ RETROGRADES Bilateral 08/19/2013   Procedure: CYSTOSCOPY WITH RETROGRADE PYELOGRAM;  Surgeon: Milford Cage, MD;  Location[facility];  Service: Urology;  Laterality: Bilateral;   CYSTOSCOPY W/ RETROGRADES Bilateral 07/18/2016   Procedure: CYSTOSCOPY WITH RETROGRADE PYELOGRAM;  Surgeon: Sebastian Ache, MD;  Location: [facility];  Service: Urology;  Laterality: Bilateral;   ENDOSCOPIC REPAIR CSF LEAK VIA NASAL PASSAGE  2011   ESOPHAGOGASTRODUODENOSCOPY (EGD) WITH PROPOFOL N/A 09/21/2022   Procedure: ESOPHAGOGASTRODUODENOSCOPY (EGD) WITH PROPOFOL;  Surgeon: Corbin Ade, MD;  [facility]: [facility];  Service: Endoscopy;  Laterality: N/A;   HEMOSTASIS CLIP PLACEMENT  09/21/2022   Procedure: HEMOSTASIS CLIP PLACEMENT;  Surgeon: Corbin Ade, MD;  [facility]: [facility];  Service: Endoscopy;;   INTRAOPERATIVE ARTERIOGRAM  CATH LAB  01-05-2009  DR Jacinto Halim   RICA   ACUTE ANGLE 80-85% STENOSIS   KNEE ARTHROSCOPY Left 07/15/2017   Procedure: ARTHROSCOPY LEFT  KNEE AND DEBRIDEMENT, medial meniscal tear  and chondromalaita;  Surgeon: Durene Romans, MD;  Location: [facility];  Service: Orthopedics;  Laterality: Left;  60 MINS   LUMBAR FUSION  03-26-2012   L3 --  L5   POLYPECTOMY  09/21/2022   Procedure: POLYPECTOMY;  Surgeon: Corbin Ade, MD;  [facility]: [facility];  Service: Endoscopy;;   RADIOACTIVE PROSTATE SEED IMPLANTS  08-19-2003   SEPTOPLASTY  1998   TOTAL KNEE ARTHROPLASTY Left 02/25/2018   Procedure: LEFT TOTAL KNEE ARTHROPLASTY;  Surgeon: Durene Romans, MD;  Location: [facility];  Service: Orthopedics;  Laterality: Left;  70 mins   TRANSCAROTID ARTERY REVASCULARIZATION  Right 05/11/2022   Procedure: Right Transcarotid Artery Revascularization;  Surgeon: Maeola Harman, MD;  Location: [facility];  Service: Vascular;  Laterality: Right;   TRANSURETHRAL RESECTION OF BLADDER TUMOR N/A 08/19/2013   Procedure: TRANSURETHRAL RESECTION OF BLADDER TUMOR (TURBT);  Surgeon: Milford Cage, MD;  Location: [facility];  Service: Urology;  Laterality: N/A;   TRANSURETHRAL RESECTION OF BLADDER TUMOR N/A 07/18/2016   Procedure: TRANSURETHRAL RESECTION OF BLADDER TUMOR (TURBT);  Surgeon: Sebastian Ache, MD;  Location: [facility];  Service: Urology;  Laterality: N/A;   YAG LASER APPLICATION  07/29/2012   Procedure: YAG LASER APPLICATION;  Surgeon: Loraine Leriche T. Nile Riggs, MD;  Location: [facility];  Service: Ophthalmology;  Laterality: Right;     Allergies  Allergen Reactions   Dilaudid [Hydromorphone Hcl] Other (See Comments)    "acts a little off"   Lyrica [Pregabalin] Nausea Only and Other (See Comments)    Make me feel "bad"   Toradol [Ketorolac Tromethamine] Other (See Comments)    CauseD  hallucination ?-states not sure/ avoids per his MD   Tramadol     Couldn't sleep      Family History  Problem Relation Age of Onset   Breast cancer Mother  mets   Diabetes Mother    Heart disease Father        MI   Colon cancer Brother    Cancer Sister         male organs   Diabetes Sister        family hx   Arthritis Other        entire family   Diabetes Brother        x 2     Social History Mr. Staib reports that he has been smoking cigarettes. He has a 47.00 pack-year smoking history. He has never used smokeless tobacco. Mr. Redder reports no history of alcohol use.   Review of Systems CONSTITUTIONAL: No weight loss, fever, chills, weakness or fatigue.  HEENT: Eyes: No visual loss, blurred vision, double vision or yellow sclerae.No hearing loss, sneezing, congestion, runny nose or sore throat.  SKIN: No rash or itching.  CARDIOVASCULAR: per hpi RESPIRATORY: No shortness of breath, cough or sputum.  GASTROINTESTINAL: No anorexia, nausea, vomiting or diarrhea. No abdominal pain or blood.  GENITOURINARY: No burning on urination, no polyuria NEUROLOGICAL: No headache, dizziness, syncope, paralysis, ataxia, numbness or tingling in the extremities. No change in bowel or bladder control.  MUSCULOSKELETAL: chronic back pain LYMPHATICS: No enlarged nodes. No history of splenectomy.  PSYCHIATRIC: No history of depression or anxiety.  ENDOCRINOLOGIC: No reports of sweating, cold or heat intolerance. No polyuria or polydipsia.  Marland Kitchen   Physical Examination Today's Vitals   02/20/23 1115  BP: (!) 144/58  Pulse: 74  SpO2: 98%  Weight: 176 lb 3.2 oz (79.9 kg)  Height: 5\' 7"  (1.702 m)   Body mass index is 27.6 kg/m.  Gen: resting comfortably, no acute distress HEENT: no scleral icterus, pupils equal round and reactive, no palptable cervical adenopathy,  CV: RRR, 3/6 systolic murmur rusb, no jvd Resp: Clear to auscultation bilaterally GI: abdomen is soft, non-tender, non-distended, normal bowel sounds, no hepatosplenomegaly MSK: extremities are warm, no edema.  Skin: warm, no rash Neuro:  no focal deficits Psych: appropriate affect   Diagnostic Studies 05/2017 nuclear stress No diagnostic ST segment changes indicating ischemia. No  arrhythmias. Moderate-sized, mild intensity, inferior and inferoseptal defects that are largely fixed with a partial region of reversibility in the mid inferoseptal zone. Normal wall motion in this area argues against scar. Although diaphragmatic attenuation is likely playing a role, consider a small region of inferoseptal ischemia. This is a low risk study. Nuclear stress EF: 64%.   07/2022 echo 1. Left ventricular ejection fraction, by estimation, is 60 to 65%. The  left ventricle has normal function. The left ventricle has no regional  wall motion abnormalities. There is mild left ventricular hypertrophy.  Left ventricular diastolic parameters  are indeterminate.   2. Right ventricular systolic function is normal. The right ventricular  size is normal. There is normal pulmonary artery systolic pressure.   3. Aortic valve leaflets are densely calcified with moderate to severe  restriction of the leaflet mobility. There is moderate aortic valve  stenosis present with calculated aortic valve area of 1.15 cm2, Vmax 2.9  m/sec and mean pressure gradient of 20  mmHg.   4. The inferior vena cava is normal in size with greater than 50%  respiratory variability, suggesting right atrial pressure of 3 mmHg.     Assessment and Plan   1 . HTN - elevated here but recent low bp's at home typically in the afternoon. Will try spacing out his AM bp meds to  see if can avoid big drops in the afternoon, may need to lower doses if not improved    2. Carotid stenosis -recent RICA stenting, continues to follow with vascular. He is on ASA/plavix per vascular   3. Hyperlipidemia -he is at goal, continue current meds   4. CAD - incidental finding on CT scan, he has not had any cardiac symptoms - prior low risk stress test -no recent symptoms, continue current meds   5. Aortic stenosis - moderate by [place], we will repeat US after next appt late 2024   F/u 6 months     Antoine Poche, M.D.

## 2023-02-20 NOTE — Patient Instructions (Signed)
Medication Instructions:  Continue all current medications.   Labwork: none  Testing/Procedures: none  Follow-Up: 6 months   Any Other Special Instructions Will Be Listed Below (If Applicable).   If you need a refill on your cardiac medications before your next appointment, please call your pharmacy.  

## 2023-03-22 ENCOUNTER — Other Ambulatory Visit: Payer: Self-pay

## 2023-03-22 DIAGNOSIS — D500 Iron deficiency anemia secondary to blood loss (chronic): Secondary | ICD-10-CM

## 2023-03-27 ENCOUNTER — Ambulatory Visit: Payer: Medicare HMO | Admitting: Physician Assistant

## 2023-03-27 ENCOUNTER — Ambulatory Visit (HOSPITAL_COMMUNITY)
Admission: RE | Admit: 2023-03-27 | Discharge: 2023-03-27 | Disposition: A | Discharge status: Home / Self Care | Payer: Medicare HMO | Source: Ambulatory Visit | Attending: Vascular Surgery | Admitting: Vascular Surgery

## 2023-03-27 VITALS — BP 132/51 | HR 78 | Temp 97.6°F | Resp 16 | Ht 68.0 in | Wt 171.0 lb

## 2023-03-27 DIAGNOSIS — Z9889 Other specified postprocedural states: Secondary | ICD-10-CM | POA: Diagnosis not present

## 2023-03-27 DIAGNOSIS — I6523 Occlusion and stenosis of bilateral carotid arteries: Secondary | ICD-10-CM | POA: Diagnosis not present

## 2023-03-27 DIAGNOSIS — I6522 Occlusion and stenosis of left carotid artery: Secondary | ICD-10-CM

## 2023-03-27 DIAGNOSIS — Z95828 Presence of other vascular implants and grafts: Secondary | ICD-10-CM | POA: Diagnosis not present

## 2023-03-27 NOTE — Progress Notes (Signed)
Office Note     CC:  follow up Requesting Provider:  Benita Stabile, MD  HPI: [name] is a 78 y.o. (1945-12-06) male who presents for follow up of carotid artery stenosis. He has history of right TCAR on 05/11/22 by Dr. Randie Heinz. This was performed for high grade asymptomatic stenosis.  We have been continuing to follow his 60-79% left ICA stenosis with duplex. He has been without any associated TIA or stroke like symptoms  Today he presents with his daughter. He denies any visual changes, slurred speech, facial drooping, unilateral upper or lower extremity weakness or numbness. He does report some weakness and fatigue in his legs on ambulation and rest. He denies any real swelling. He does report cramping frequently in is legs at night. He says he started taking Magnesium supplement which has helped this. He denies any pain that wakes him up from sleep and no tissue loss. He has history of C3-C7 Cervical Fusion. Also L3-L5 lumbar fusion. These do limit his mobility. He reports that he remains very active though. He is medically managed on Aspirin, Statin, Plavix.  He continues to smoke 1 ppd.   Past Medical History:  Diagnosis Date   Bilateral renal cysts    Bladder cancer (HCC) UROLOGIST-  DR [facility]   RECURRENT 11/ 2017 --  hx turbt in 2004 and 08-19-2013   BPH without obstruction/lower urinary tract symptoms    Cervical fusion syndrome    LEFT ARM NUMBNESS--  CONTROLLED W/ GABAPENTIN   Coronary artery disease    CARDIOLOGIST-- DR Wyline Mood Lorelle Formosa)   DDD (degenerative disc disease), cervical    DDD (degenerative disc disease), lumbar    Diverticulosis of sigmoid colon    Heart murmur    slight murmur per patient   History of acute gouty arthritis    History of adenomatous polyp of colon    2001;  2012- non-malignant leiomyoma;  04-07-2014  tubualr adenoma and hyperplastic polyp's   History of prostate cancer UROLOGIST-  DR [facility]   S/P  RADIOACTIVE SEED IMPLANTS 2004   HOH  (hard of hearing)    no hearing aids   Hyperlipidemia    Hypertension    OA (osteoarthritis)    Occlusion and stenosis of carotid artery without mention of cerebral infarction CARDIOLOGIST  -- DR  Christiane Ha BRANCH   LAST DUPLEX  11-23-2015  RICA  (DUPLEX DOPPLER 01-20-2016 RICA 60-79% PROXIMAL) /   LICA <50% (HAD CONSULT W/ DR Imogene Burn , VASCULAR SURGEON 01-20-2016)   OSA on CPAP    BiPAP   Prostate cancer (HCC)    per patient "years ago"- had radiation implant   Spondylosis, cervical    Type 2 diabetes mellitus (HCC)    Wears glasses     Past Surgical History:  Procedure Laterality Date   ANTERIOR REMOVAL CAGE AND PLATE Z6-X0/ CORPECTOMY C7 (FX)/  ALLOGRAFT AND FUSION C3 -- T1/  POSTERIOR DECOMPRESSION BILATERAL LAMINECTOMY C4 -- 6 & PARTIAL C3/  POSTEROLATERAL ARTHRODESIS C3 - T1  06/05/2005   APPENDECTOMY  1990'S   BIOPSY  09/21/2022   Procedure: BIOPSY;  Surgeon: Corbin Ade, MD;  [facility];  Service: Endoscopy;;   CATARACT EXTRACTION W/ INTRAOCULAR LENS  IMPLANT, BILATERAL  2013   CERVICAL FUSION  05/11/2005   C3  --  C7/  due to post op quadriparesis same day s/p  anterior C 4,5,6, colpectomy, decompression, removal epidural hematoma, foraminnotomy, cage and plate   COLONOSCOPY  last one  04-07-2014   COLONOSCOPY WITH PROPOFOL N/A 09/21/2022   Procedure: COLONOSCOPY WITH PROPOFOL;  Surgeon: Corbin Ade, MD;  [facility]: [facility];  Service: Endoscopy;  Laterality: N/A;   CYSTOSCOPY W/ RETROGRADES Bilateral 08/19/2013   Procedure: CYSTOSCOPY WITH RETROGRADE PYELOGRAM;  Surgeon: Milford Cage, MD;  Location: [facility];  Service: Urology;  Laterality: Bilateral;   CYSTOSCOPY W/ RETROGRADES Bilateral 07/18/2016   Procedure: CYSTOSCOPY WITH RETROGRADE PYELOGRAM;  Surgeon: Sebastian Ache, MD;  Location: [facility];  Service: Urology;  Laterality: Bilateral;   ENDOSCOPIC REPAIR CSF LEAK VIA NASAL PASSAGE  2011    ESOPHAGOGASTRODUODENOSCOPY (EGD) WITH PROPOFOL N/A 09/21/2022   Procedure: ESOPHAGOGASTRODUODENOSCOPY (EGD) WITH PROPOFOL;  Surgeon: Corbin Ade, MD;  [facility];  Service: Endoscopy;  Laterality: N/A;   HEMOSTASIS CLIP PLACEMENT  09/21/2022   Procedure: HEMOSTASIS CLIP PLACEMENT;  Surgeon: Corbin Ade, MD;  [facility];  Service: Endoscopy;;   INTRAOPERATIVE ARTERIOGRAM  CATH LAB  01-05-2009  DR Jacinto Halim   RICA   ACUTE ANGLE 80-85% STENOSIS   KNEE ARTHROSCOPY Left 07/15/2017   Procedure: ARTHROSCOPY LEFT  KNEE AND DEBRIDEMENT, medial meniscal tear and chondromalaita;  Surgeon: Durene Romans, MD;  Location: [facility];  Service: Orthopedics;  Laterality: Left;  60 MINS   LUMBAR FUSION  03-26-2012   L3 --  L5   POLYPECTOMY  09/21/2022   Procedure: POLYPECTOMY;  Surgeon: Corbin Ade, MD;  [facility];  Service: Endoscopy;;   RADIOACTIVE PROSTATE SEED IMPLANTS  08-19-2003   SEPTOPLASTY  1998   TOTAL KNEE ARTHROPLASTY Left 02/25/2018   Procedure: LEFT TOTAL KNEE ARTHROPLASTY;  Surgeon: Durene Romans, MD;  Location: [facility];  Service: Orthopedics;  Laterality: Left;  70 mins   TRANSCAROTID ARTERY REVASCULARIZATION  Right 05/11/2022   Procedure: Right Transcarotid Artery Revascularization;  Surgeon: Maeola Harman, MD;  Location: [facility];  Service: Vascular;  Laterality: Right;   TRANSURETHRAL RESECTION OF BLADDER TUMOR N/A 08/19/2013   Procedure: TRANSURETHRAL RESECTION OF BLADDER TUMOR (TURBT);  Surgeon: Milford Cage, MD;  Location: [facility];  Service: Urology;  Laterality: N/A;   TRANSURETHRAL RESECTION OF BLADDER TUMOR N/A 07/18/2016   Procedure: TRANSURETHRAL RESECTION OF BLADDER TUMOR (TURBT);  Surgeon: Sebastian Ache, MD;  Location[facility];  Service: Urology;  Laterality: N/A;   YAG LASER APPLICATION  07/29/2012   Procedure: YAG LASER APPLICATION;  Surgeon: Loraine Leriche T. Nile Riggs, MD;  Location: [facility];  Service:  Ophthalmology;  Laterality: Right;    Social History   Socioeconomic History   Marital status: Married    Spouse name: Not on file   Number of children: 2   Years of education: Not on file   Highest education level: Not on file  Occupational History   Occupation: retired//disabled  Tobacco Use   Smoking status: Every Day    Packs/day: 1.00    Years: 47.00    Additional pack years: 0.00    Total pack years: 47.00    Types: Cigarettes   Smokeless tobacco: Never   Tobacco comments:    pt quits smoking periodically :: started smoking again Jan 2018 1ppd  Vaping Use   Vaping Use: Never used  Substance and Sexual Activity   Alcohol use: No    Alcohol/week: 0.0 standard drinks of alcohol   Drug use: No   Sexual activity: Not Currently  Other Topics Concern   Not on file  Social History Narrative  Not on file   Social Determinants of Health   Financial Resource Strain: Not on file  Food Insecurity: No Food Insecurity (09/21/2022)   Hunger Vital Sign    Worried About Running Out of Food in the Last Year: Never true    Ran Out of Food in the Last Year: Never true  Transportation Needs: No Transportation Needs (09/21/2022)   PRAPARE - Administrator, Civil Service (Medical): No    Lack of Transportation (Non-Medical): No  Physical Activity: Not on file  Stress: Not on file  Social Connections: Not on file  Intimate Partner Violence: Not At Risk (09/21/2022)   Humiliation, Afraid, Rape, and Kick questionnaire    Fear of Current or Ex-Partner: No    Emotionally Abused: No    Physically Abused: No    Sexually Abused: No    Family History  Problem Relation Age of Onset   Breast cancer Mother        mets   Diabetes Mother    Heart disease Father        MI   Colon cancer Brother    Cancer Sister        male organs   Diabetes Sister        family hx   Arthritis Other        entire family   Diabetes Brother        x 2    Current Outpatient Medications   Medication Sig Dispense Refill   acetaminophen (TYLENOL) 500 MG tablet Take 1,000 mg by mouth every 6 (six) hours as needed for moderate pain.     albuterol (VENTOLIN HFA) 108 (90 Base) MCG/ACT inhaler Inhale TWO puffs into THE lungs every FOUR hours AS DIRECTED     allopurinol (ZYLOPRIM) 300 MG tablet Take 300 mg by mouth at bedtime.     amLODipine (NORVASC) 10 MG tablet TAKE 1 TABLET BY MOUTH EVERY DAY 90 tablet 3   aspirin 81 MG EC tablet Take 81 mg by mouth at bedtime.     celecoxib (CELEBREX) 200 MG capsule Take 200 mg by mouth 2 (two) times daily.     clopidogrel (PLAVIX) 75 MG tablet Take 75 mg by mouth daily.   6   colchicine 0.6 MG tablet Take 0.6 mg by mouth 2 (two) times daily as needed (gout).      cyanocobalamin (VITAMIN B12) 1000 MCG tablet Take 1 tablet (1,000 mcg total) by mouth daily. 30 tablet 1   docusate sodium (COLACE) 100 MG capsule Take 100 mg by mouth at bedtime.     finasteride (PROSCAR) 5 MG tablet Take 5 mg by mouth at bedtime.     furosemide (LASIX) 40 MG tablet Take 40 mg by mouth every morning.     gabapentin (NEURONTIN) 100 MG capsule Take 300 mg by mouth 2 (two) times daily.     Iron-FA-B Cmp-C-Biot-Probiotic (FUSION PLUS) CAPS Take 1 capsule by mouth daily.     labetalol (NORMODYNE) 100 MG tablet Take 100 mg by mouth 2 (two) times daily.     losartan (COZAAR) 100 MG tablet Take 1 tablet (100 mg total) by mouth every evening. 30 tablet 6   metFORMIN (GLUCOPHAGE-XR) 500 MG 24 hr tablet Take 1,000 mg by mouth in the morning and at bedtime.     Omega-3-6-9 CAPS Take 1 capsule by mouth 2 (two) times daily.     pantoprazole (PROTONIX) 40 MG tablet Take 1 tablet (40 mg total) by mouth 2 (  two) times daily for 14 days. 28 tablet 0   polyethylene glycol powder (GLYCOLAX/MIRALAX) powder TAKE 17MG (1CAPFUL) BY MOUTH(MIXED WITH WATER/JUICE) DAILY 527 g 0   rosuvastatin (CRESTOR) 10 MG tablet Take 10 mg by mouth every morning.      tamsulosin (FLOMAX) 0.4 MG CAPS capsule  Take 0.4 mg by mouth daily.     Vitamin D, Ergocalciferol, (DRISDOL) 1.25 MG (50000 UNIT) CAPS capsule Take 50,000 Units by mouth every Thursday.     No current facility-administered medications for this visit.    Allergies  Allergen Reactions   Dilaudid [Hydromorphone Hcl] Other (See Comments)    "acts a little off"   Lyrica [Pregabalin] Nausea Only and Other (See Comments)    Make me feel "bad"   Toradol [Ketorolac Tromethamine] Other (See Comments)    CauseD  hallucination ?-states not sure/ avoids per his MD   Tramadol     Couldn't sleep     REVIEW OF SYSTEMS:  [X]  denotes positive finding, [ ]  denotes negative finding Cardiac  Comments:  Chest pain or chest pressure:    Shortness of breath upon exertion:    Short of breath when lying flat:    Irregular heart rhythm:        Vascular    Pain in calf, thigh, or hip brought on by ambulation:    Pain in feet at night that wakes you up from your sleep:     Blood clot in your veins:    Leg swelling:         Pulmonary    Oxygen at home:    Productive cough:     Wheezing:         Neurologic    Sudden weakness in arms or legs:     Sudden numbness in arms or legs:     Sudden onset of difficulty speaking or slurred speech:    Temporary loss of vision in one eye:     Problems with dizziness:         Gastrointestinal    Blood in stool:     Vomited blood:         Genitourinary    Burning when urinating:     Blood in urine:        Psychiatric    Major depression:         Hematologic    Bleeding problems:    Problems with blood clotting too easily:        Skin    Rashes or ulcers:        Constitutional    Fever or chills:      PHYSICAL EXAMINATION:  Vitals:   03/27/23 1331 03/27/23 1335  BP: (!) 129/57 (!) 132/51  Pulse: 75 78  Resp: 16   Temp: 97.6 F (36.4 C)   TempSrc: Temporal   SpO2: 97%   Weight: 171 lb (77.6 kg)   Height: 5\' 8"  (1.727 m)     General:  WDWN in NAD; vital signs documented  above Gait: Normal HENT: WNL, normocephalic Pulmonary: normal non-labored breathing , without wheezing Cardiac: regular HR; aortic murmur preset, carotid bruit on left Abdomen: soft Vascular Exam/Pulses: 2+ femoral, 2+ DP pulses bilaterally. Feet warm and well perfused Extremities: without ischemic changes, without Gangrene , without cellulitis; without open wounds; edema bilaterally  Musculoskeletal: no muscle wasting or atrophy  Neurologic: A&O X 3 Psychiatric:  The pt has Normal affect.   Non-Invasive Vascular Imaging:    Right Carotid Findings:  +----------+-------+-------+--------+---------------------------------+----  ----+  PSV    EDV    StenosisPlaque Description                Comments           cm/s   cm/s                                                       +----------+-------+-------+--------+---------------------------------+----  ----+  CCA Prox  77     14                                                         +----------+-------+-------+--------+---------------------------------+----  ----+  CCA Mid   92     14                                                         +----------+-------+-------+--------+---------------------------------+----  ----+  CCA Distal70     14     <50%    irregular, heterogenous and       Stent                                     calcific                                    +----------+-------+-------+--------+---------------------------------+----  ----+  ICA Prox  182    50     40-59%  irregular, heterogenous and       Stent                                     calcific                                    +----------+-------+-------+--------+---------------------------------+----  ----+  ICA Mid   141    35             irregular, heterogenous and       Stent                                     calcific                                     +----------+-------+-------+--------+---------------------------------+----  ----+  ICA [id number]    28                                                         +----------+-------+-------+--------+---------------------------------+----  ----+  ECA      109                                                               +----------+-------+-------+--------+---------------------------------+----  ----+   +----------+--------+-------+----------------+-------------------+           PSV cm/sEDV cmsDescribe        Arm Pressure (mmHG)  +----------+--------+-------+----------------+-------------------+  [id number]           Multiphasic, [id number]                  +----------+--------+-------+----------------+-------------------+   +---------+--------+--+--------+--+---------+  VertebralPSV cm/s55EDV cm/s13Antegrade  +---------+--------+--+--------+--+---------+      Left Carotid Findings:  +----------+-------+-------+--------+---------------------------------+----  ----+           PSV    EDV    StenosisPlaque Description                Comments           cm/s   cm/s                                                       +----------+-------+-------+--------+---------------------------------+----  ----+  CCA Prox  109    24                                                         +----------+-------+-------+--------+---------------------------------+----  ----+  CCA Mid   125    19     <50%    irregular and heterogenous                  +----------+-------+-------+--------+---------------------------------+----  ----+  CCA Distal86     20     <50%    irregular and heterogenous                  +----------+-------+-------+--------+---------------------------------+----  ----+  ICA Prox  90     23             irregular, heterogenous and                                                 calcific                                     +----------+-------+-------+--------+---------------------------------+----  ----+  ICA Mid   353    110    60-79%  irregular, heterogenous and                                                 calcific                                    +----------+-------+-------+--------+---------------------------------+----  ----+  ICA Distal161    59                                                         +----------+-------+-------+--------+---------------------------------+----  ----+  ECA      134                                                               +----------+-------+-------+--------+---------------------------------+----  ----+   +----------+--------+--------+----------------+-------------------+           PSV cm/sEDV cm/sDescribe        Arm Pressure (mmHG)  +----------+--------+--------+----------------+-------------------+  [id number]            Multiphasic, [id number]                  +----------+--------+--------+----------------+-------------------+   +---------+--------+--+--------+--+---------+  VertebralPSV cm/s58EDV cm/s16Antegrade  +---------+--------+--+--------+--+---------+   Summary:  Right Carotid: Velocities in the right ICA are consistent with a 40-59% stenosis. Non-hemodynamically significant plaque <50% noted in the CCA.   Left Carotid: Velocities in the left ICA are consistent with a 60-79% stenosis.Non-hemodynamically significant plaque <50% noted in the CCA.   Vertebrals: Bilateral vertebral arteries demonstrate antegrade flow.  Subclavians: Normal flow hemodynamics were seen in bilateral subclavian arteries.    ASSESSMENT/PLAN:: 77 y.o. male here for follow up for carotid artery stenosis. He has history of right TCAR on 05/11/22 by Dr. Randie Heinz. This was performed for high grade asymptomatic stenosis.  We have been continuing to follow his 60-79% left ICA stenosis with duplex. Initially on  pre operative CTA this was greater than 80%. He has been without any associated TIA or stroke like symptoms. Duplex today shows increased EDV in the left ICA suggesting > 80% stenosis. I recommend repeat CTA neck for further evaluation and surgical planning - Suspect that he has some venous insufficiency. I have encouraged him to elevate his legs and consider knee high compression stockings - Continue Aspirin, statin, Plavix - I will set him up for CTA Neck and he will follow up with Dr. Randie Heinz to discuss left CEA vs TCAR in the near future   Graceann Congress, PA-C [facility] [phone]  Clinic MD: Randie Heinz

## 2023-04-04 ENCOUNTER — Other Ambulatory Visit: Payer: Self-pay

## 2023-04-04 DIAGNOSIS — I6522 Occlusion and stenosis of left carotid artery: Secondary | ICD-10-CM

## 2023-04-10 LAB — CBC WITH DIFFERENTIAL/PLATELET
Absolute Monocytes: 658 cells/uL (ref 200–950)
Basophils Absolute: 70 cells/uL (ref 0–200)
Basophils Relative: 1 %
Eosinophils Absolute: 511 cells/uL — ABNORMAL HIGH (ref 15–500)
Eosinophils Relative: 7.3 %
HCT: 32.7 % — ABNORMAL LOW (ref 38.5–50.0)
Hemoglobin: 10.8 g/dL — ABNORMAL LOW (ref 13.2–17.1)
Lymphs Abs: 1197 cells/uL (ref 850–3900)
MCH: 32.2 pg (ref 27.0–33.0)
MCHC: 33 g/dL (ref 32.0–36.0)
MCV: 97.6 fL (ref 80.0–100.0)
MPV: 11.3 fL (ref 7.5–12.5)
Monocytes Relative: 9.4 %
Neutro Abs: 4564 cells/uL (ref 1500–7800)
Neutrophils Relative %: 65.2 %
Platelets: 155 10*3/uL (ref 140–400)
RBC: 3.35 10*6/uL — ABNORMAL LOW (ref 4.20–5.80)
RDW: 13.3 % (ref 11.0–15.0)
Total Lymphocyte: 17.1 %
WBC: 7 10*3/uL (ref 3.8–10.8)

## 2023-04-15 ENCOUNTER — Other Ambulatory Visit: Payer: Self-pay

## 2023-04-15 DIAGNOSIS — D500 Iron deficiency anemia secondary to blood loss (chronic): Secondary | ICD-10-CM

## 2023-04-22 ENCOUNTER — Ambulatory Visit (HOSPITAL_COMMUNITY)
Admission: RE | Admit: 2023-04-22 | Discharge: 2023-04-22 | Disposition: A | Discharge status: Home / Self Care | Payer: Medicare HMO | Source: Ambulatory Visit | Attending: Vascular Surgery | Admitting: Vascular Surgery

## 2023-04-22 DIAGNOSIS — I6522 Occlusion and stenosis of left carotid artery: Secondary | ICD-10-CM | POA: Insufficient documentation

## 2023-04-22 LAB — POCT I-STAT CREATININE: Creatinine, Ser: 1.3 mg/dL — ABNORMAL HIGH (ref 0.61–1.24)

## 2023-04-22 MED ORDER — IOHEXOL 350 MG/ML SOLN
75.0000 mL | Freq: Once | INTRAVENOUS | Status: AC | PRN
  Administered 2023-04-22: 75 mL via INTRAVENOUS

## 2023-05-01 ENCOUNTER — Encounter: Payer: Self-pay | Admitting: Vascular Surgery

## 2023-05-01 ENCOUNTER — Ambulatory Visit: Payer: Medicare HMO | Admitting: Vascular Surgery

## 2023-05-01 VITALS — BP 171/78 | HR 70 | Temp 98.1°F | Resp 18 | Ht 67.0 in | Wt 176.2 lb

## 2023-05-01 DIAGNOSIS — I6523 Occlusion and stenosis of bilateral carotid arteries: Secondary | ICD-10-CM

## 2023-05-01 NOTE — Progress Notes (Signed)
Patient ID: [name], male   DOB: 12-09-1945, 77 y.o.   MRN: [id number]  Reason for Consult: Follow-up (1 month FU with CTA neck prior [name] c/o left arm soreness and numbness)   Referred by Benita Stabile, MD  Subjective:     HPI:  [name] is a 77 y.o. male has a history of right-sided transcarotid artery stenting in 2023 for asymptomatic disease.  He continues to have no strokes.  He does have significant history of brain and neck surgeries now complains of pain in his bilateral lower extremities he wears compression stockings and also has pain in the left shoulder radiating down the left arm.  He does not have any other vascular issues for which she has had intervention in the past.  He continues on aspirin Plavix and statin.  Past Medical History:  Diagnosis Date   Bilateral renal cysts    Bladder cancer (HCC) UROLOGIST-  DR [facility]   RECURRENT 11/ 2017 --  hx turbt in 2004 and 08-19-2013   BPH without obstruction/lower urinary tract symptoms    Cervical fusion syndrome    LEFT ARM NUMBNESS--  CONTROLLED W/ GABAPENTIN   Coronary artery disease    CARDIOLOGIST-- DR Wyline Mood Lorelle Formosa)   DDD (degenerative disc disease), cervical    DDD (degenerative disc disease), lumbar    Diverticulosis of sigmoid colon    Heart murmur    slight murmur per patient   History of acute gouty arthritis    History of adenomatous polyp of colon    2001;  2012- non-malignant leiomyoma;  04-07-2014  tubualr adenoma and hyperplastic polyp's   History of prostate cancer UROLOGIST-  DR [facility]   S/P  RADIOACTIVE SEED IMPLANTS 2004   HOH (hard of hearing)    no hearing aids   Hyperlipidemia    Hypertension    OA (osteoarthritis)    Occlusion and stenosis of carotid artery without mention of cerebral infarction CARDIOLOGIST  -- DR  Christiane Ha BRANCH   LAST DUPLEX  11-23-2015  RICA  (DUPLEX DOPPLER 01-20-2016 RICA 60-79% PROXIMAL) /   LICA <50% (HAD CONSULT W/ DR Imogene Burn , VASCULAR SURGEON  01-20-2016)   OSA on CPAP    BiPAP   Prostate cancer (HCC)    per patient "years ago"- had radiation implant   Spondylosis, cervical    Type 2 diabetes mellitus (HCC)    Wears glasses    Family History  Problem Relation Age of Onset   Breast cancer Mother        mets   Diabetes Mother    Heart disease Father        MI   Colon cancer Brother    Cancer Sister        male organs   Diabetes Sister        family hx   Arthritis Other        entire family   Diabetes Brother        x 2   Past Surgical History:  Procedure Laterality Date   ANTERIOR REMOVAL CAGE AND PLATE W0-J8/ CORPECTOMY C7 (FX)/  ALLOGRAFT AND FUSION C3 -- T1/  POSTERIOR DECOMPRESSION BILATERAL LAMINECTOMY C4 -- 6 & PARTIAL C3/  POSTEROLATERAL ARTHRODESIS C3 - T1  06/05/2005   APPENDECTOMY  1990'S   BIOPSY  09/21/2022   Procedure: BIOPSY;  Surgeon: Corbin Ade, MD;  [facility];  Service: Endoscopy;;   CATARACT EXTRACTION W/ INTRAOCULAR LENS  IMPLANT, BILATERAL  2013   CERVICAL FUSION  05/11/2005   C3  --  C7/  due to post op quadriparesis same day s/p  anterior C 4,5,6, colpectomy, decompression, removal epidural hematoma, foraminnotomy, cage and plate   COLONOSCOPY  last one 04-07-2014   COLONOSCOPY WITH PROPOFOL N/A 09/21/2022   Procedure: COLONOSCOPY WITH PROPOFOL;  Surgeon: Corbin Ade, MD;  [facility];  Service: Endoscopy;  Laterality: N/A;   CYSTOSCOPY W/ RETROGRADES Bilateral 08/19/2013   Procedure: CYSTOSCOPY WITH RETROGRADE PYELOGRAM;  Surgeon: Milford Cage, MD;  Location: [facility];  Service: Urology;  Laterality: Bilateral;   CYSTOSCOPY W/ RETROGRADES Bilateral 07/18/2016   Procedure: CYSTOSCOPY WITH RETROGRADE PYELOGRAM;  Surgeon: Sebastian Ache, MD;  Location: [facility];  Service: Urology;  Laterality: Bilateral;   ENDOSCOPIC REPAIR CSF LEAK VIA NASAL PASSAGE  2011   ESOPHAGOGASTRODUODENOSCOPY (EGD) WITH PROPOFOL N/A 09/21/2022    Procedure: ESOPHAGOGASTRODUODENOSCOPY (EGD) WITH PROPOFOL;  Surgeon: Corbin Ade, MD;  [facility];  Service: Endoscopy;  Laterality: N/A;   HEMOSTASIS CLIP PLACEMENT  09/21/2022   Procedure: HEMOSTASIS CLIP PLACEMENT;  Surgeon: Corbin Ade, MD;  [facility];  Service: Endoscopy;;   INTRAOPERATIVE ARTERIOGRAM  CATH LAB  01-05-2009  DR Jacinto Halim   RICA   ACUTE ANGLE 80-85% STENOSIS   KNEE ARTHROSCOPY Left 07/15/2017   Procedure: ARTHROSCOPY LEFT  KNEE AND DEBRIDEMENT, medial meniscal tear and chondromalaita;  Surgeon: Durene Romans, MD;  [facility];  Service: Orthopedics;  Laterality: Left;  60 MINS   LUMBAR FUSION  03-26-2012   L3 --  L5   POLYPECTOMY  09/21/2022   Procedure: POLYPECTOMY;  Surgeon: Corbin Ade, MD;  [facility];  Service: Endoscopy;;   RADIOACTIVE PROSTATE SEED IMPLANTS  08-19-2003   SEPTOPLASTY  1998   TOTAL KNEE ARTHROPLASTY Left 02/25/2018   Procedure: LEFT TOTAL KNEE ARTHROPLASTY;  Surgeon: Durene Romans, MD;  [facility];  Service: Orthopedics;  Laterality: Left;  70 mins   TRANSCAROTID ARTERY REVASCULARIZATION  Right 05/11/2022   Procedure: Right Transcarotid Artery Revascularization;  Surgeon: Maeola Harman, MD;  Location[facility];  Service: Vascular;  Laterality: Right;   TRANSURETHRAL RESECTION OF BLADDER TUMOR N/A 08/19/2013   Procedure: TRANSURETHRAL RESECTION OF BLADDER TUMOR (TURBT);  Surgeon: Milford Cage, MD;  Location: [facility];  Service: Urology;  Laterality: N/A;   TRANSURETHRAL RESECTION OF BLADDER TUMOR N/A 07/18/2016   Procedure: TRANSURETHRAL RESECTION OF BLADDER TUMOR (TURBT);  Surgeon: Sebastian Ache, MD;  Location: [facility];  Service: Urology;  Laterality: N/A;   YAG LASER APPLICATION  07/29/2012   Procedure: YAG LASER APPLICATION;  Surgeon: Loraine Leriche T. Nile Riggs, MD;  [facility];  Service: Ophthalmology;  Laterality: Right;    Short Social History:   Social History   Tobacco Use   Smoking status: Every Day    Current packs/day: 1.00    Average packs/day: 1 pack/day for 47.0 years (47.0 ttl pk-yrs)    Types: Cigarettes   Smokeless tobacco: Never   Tobacco comments:    pt quits smoking periodically :: started smoking again Jan 2018 1ppd  Substance Use Topics   Alcohol use: No    Alcohol/week: 0.0 standard drinks of alcohol    Allergies  Allergen Reactions   Dilaudid [Hydromorphone Hcl] Other (See Comments)    "acts a little off"   Lyrica [Pregabalin] Nausea Only and Other (See Comments)    Make me feel "  bad"   Toradol [Ketorolac Tromethamine] Other (See Comments)    CauseD  hallucination ?-states not sure/ avoids per his MD   Tramadol     Couldn't sleep    Current Outpatient Medications  Medication Sig Dispense Refill   acetaminophen (TYLENOL) 500 MG tablet Take 1,000 mg by mouth every 6 (six) hours as needed for moderate pain.     albuterol (VENTOLIN HFA) 108 (90 Base) MCG/ACT inhaler Inhale TWO puffs into THE lungs every FOUR hours AS DIRECTED     allopurinol (ZYLOPRIM) 300 MG tablet Take 300 mg by mouth at bedtime.     amLODipine (NORVASC) 10 MG tablet TAKE 1 TABLET BY MOUTH EVERY DAY 90 tablet 3   aspirin 81 MG EC tablet Take 81 mg by mouth at bedtime.     celecoxib (CELEBREX) 200 MG capsule Take 200 mg by mouth 2 (two) times daily.     clopidogrel (PLAVIX) 75 MG tablet Take 75 mg by mouth daily.   6   colchicine 0.6 MG tablet Take 0.6 mg by mouth 2 (two) times daily as needed (gout).      cyanocobalamin (VITAMIN B12) 1000 MCG tablet Take 1 tablet (1,000 mcg total) by mouth daily. 30 tablet 1   docusate sodium (COLACE) 100 MG capsule Take 100 mg by mouth at bedtime.     finasteride (PROSCAR) 5 MG tablet Take 5 mg by mouth at bedtime.     furosemide (LASIX) 40 MG tablet Take 40 mg by mouth every morning.     gabapentin (NEURONTIN) 100 MG capsule Take 300 mg by mouth 2 (two) times daily.     Iron-FA-B  Cmp-C-Biot-Probiotic (FUSION PLUS) CAPS Take 1 capsule by mouth daily.     labetalol (NORMODYNE) 100 MG tablet Take 100 mg by mouth 2 (two) times daily.     losartan (COZAAR) 100 MG tablet Take 1 tablet (100 mg total) by mouth every evening. 30 tablet 6   metFORMIN (GLUCOPHAGE-XR) 500 MG 24 hr tablet Take 500 mg by mouth in the morning and at bedtime.     Omega-3-6-9 CAPS Take 1 capsule by mouth 2 (two) times daily.     pantoprazole (PROTONIX) 40 MG tablet Take 1 tablet (40 mg total) by mouth 2 (two) times daily for 14 days. 28 tablet 0   polyethylene glycol powder (GLYCOLAX/MIRALAX) powder TAKE 17MG (1CAPFUL) BY MOUTH(MIXED WITH WATER/JUICE) DAILY 527 g 0   rosuvastatin (CRESTOR) 10 MG tablet Take 10 mg by mouth every morning.      tamsulosin (FLOMAX) 0.4 MG CAPS capsule Take 0.4 mg by mouth daily.     Vitamin D, Ergocalciferol, (DRISDOL) 1.25 MG (50000 UNIT) CAPS capsule Take 50,000 Units by mouth every Thursday.     No current facility-administered medications for this visit.    Review of Systems  Constitutional:  Constitutional negative. HENT: HENT negative.  Eyes: Eyes negative.  Respiratory: Respiratory negative.  Cardiovascular: Cardiovascular negative.  GI: Gastrointestinal negative.  Musculoskeletal: Positive for leg pain.  Skin: Skin negative.  Neurological: Positive for numbness.  Hematologic: Hematologic/lymphatic negative.  Psychiatric: Psychiatric negative.        Objective:  Objective   Vitals:   05/01/23 0922  BP: (!) 171/78  Pulse: 70  Resp: 18  Temp: 98.1 F (36.7 C)  TempSrc: Temporal  SpO2: (!) 18%  Weight: 176 lb 3.2 oz (79.9 kg)  Height: 5\' 7"  (1.702 m)   Body mass index is 27.6 kg/m.  Physical Exam HENT:     Head: Normocephalic.  Nose: Nose normal.  Eyes:     Pupils: Pupils are equal, round, and reactive to light.  Neck:     Vascular: No carotid bruit.  Cardiovascular:     Rate and Rhythm: Normal rate.     Pulses: Normal pulses.   Pulmonary:     Effort: Pulmonary effort is normal.  Abdominal:     General: Abdomen is flat.  Skin:    General: Skin is warm.     Capillary Refill: Capillary refill takes less than 2 seconds.  Neurological:     Mental Status: He is alert.  Psychiatric:        Mood and Affect: Mood normal.        Behavior: Behavior normal.        Thought Content: Thought content normal.        Judgment: Judgment normal.     Data: CTA reviewed, no read available.      Assessment/Plan:    77 year old male with high-grade stenosis of his left ICA appears to be an 80% stenosis by CTA.  The read is not back from radiology today we reviewed the CT together as well as his previous CT.  Right-sided stent appears patent by both duplex and CT.  Patient's wife is at the end stage of stage IV lung cancer currently on hospice and likely the end is in the left according to the patient and his son.  As such I will set up a follow-up with the patient in 6 to 8 weeks likely a phone call we can discuss proceeding with continued medical therapy versus left sided transcarotid artery stenting.  Given his history of neck fusion he would not be a carotid endarterectomy candidate.     Maeola Harman MD [facility]

## 2023-06-11 ENCOUNTER — Other Ambulatory Visit (HOSPITAL_COMMUNITY): Payer: Self-pay | Admitting: Orthopedic Surgery

## 2023-06-11 DIAGNOSIS — M25552 Pain in left hip: Secondary | ICD-10-CM

## 2023-06-18 ENCOUNTER — Ambulatory Visit (HOSPITAL_COMMUNITY)
Admission: RE | Admit: 2023-06-18 | Discharge: 2023-06-18 | Disposition: A | Discharge status: Home / Self Care | Payer: Medicare HMO | Source: Ambulatory Visit | Attending: Orthopedic Surgery | Admitting: Orthopedic Surgery

## 2023-06-18 DIAGNOSIS — M25552 Pain in left hip: Secondary | ICD-10-CM | POA: Diagnosis present

## 2023-06-21 ENCOUNTER — Ambulatory Visit: Payer: Medicare HMO | Admitting: Pulmonary Disease

## 2023-06-24 ENCOUNTER — Other Ambulatory Visit: Payer: Self-pay

## 2023-06-24 DIAGNOSIS — D500 Iron deficiency anemia secondary to blood loss (chronic): Secondary | ICD-10-CM

## 2023-06-26 ENCOUNTER — Ambulatory Visit: Payer: Medicare HMO | Admitting: Vascular Surgery

## 2023-06-26 DIAGNOSIS — I6522 Occlusion and stenosis of left carotid artery: Secondary | ICD-10-CM | POA: Diagnosis not present

## 2023-06-26 NOTE — Progress Notes (Signed)
Virtual Visit via Telephone Note  I connected with [name] on 06/26/2023 using the Doxy[employer]me by telephone and verified that I was speaking with the correct person using two identifiers. Patient was located at at home and accompanied by his daughter. I am located at the office alone.   Chief Complaint: no complaints today  History of Present Illness: [name] is a 77 y.o. male with history of right transcarotid artery stenting now with high-grade left ICA stenosis that remains asymptomatic.  His wife recently passed away after our last visit.  Patient continues on aspirin Plavix and statin.  Past Medical History:  Diagnosis Date   Bilateral renal cysts    Bladder cancer (HCC) UROLOGIST-  DR [facility]   RECURRENT 11/ 2017 --  hx turbt in 2004 and 08-19-2013   BPH without obstruction/lower urinary tract symptoms    Cervical fusion syndrome    LEFT ARM NUMBNESS--  CONTROLLED W/ GABAPENTIN   Coronary artery disease    CARDIOLOGIST-- DR Wyline Mood Lorelle Formosa)   DDD (degenerative disc disease), cervical    DDD (degenerative disc disease), lumbar    Diverticulosis of sigmoid colon    Heart murmur    slight murmur per patient   History of acute gouty arthritis    History of adenomatous polyp of colon    2001;  2012- non-malignant leiomyoma;  04-07-2014  tubualr adenoma and hyperplastic polyp's   History of prostate cancer UROLOGIST-  DR [facility]   S/P  RADIOACTIVE SEED IMPLANTS 2004   HOH (hard of hearing)    no hearing aids   Hyperlipidemia    Hypertension    OA (osteoarthritis)    Occlusion and stenosis of carotid artery without mention of cerebral infarction CARDIOLOGIST  -- DR  Christiane Ha BRANCH   LAST DUPLEX  11-23-2015  RICA  (DUPLEX DOPPLER 01-20-2016 RICA 60-79% PROXIMAL) /   LICA <50% (HAD CONSULT W/ DR Imogene Burn , VASCULAR SURGEON 01-20-2016)   OSA on CPAP    BiPAP   Prostate cancer (HCC)    per patient "years ago"- had radiation implant   Spondylosis, cervical     Type 2 diabetes mellitus (HCC)    Wears glasses     Past Surgical History:  Procedure Laterality Date   ANTERIOR REMOVAL CAGE AND PLATE W4-X3/ CORPECTOMY C7 (FX)/  ALLOGRAFT AND FUSION C3 -- T1/  POSTERIOR DECOMPRESSION BILATERAL LAMINECTOMY C4 -- 6 & PARTIAL C3/  POSTEROLATERAL ARTHRODESIS C3 - T1  06/05/2005   APPENDECTOMY  1990'S   BIOPSY  09/21/2022   Procedure: BIOPSY;  Surgeon: Corbin Ade, MD;  [facility];  Service: Endoscopy;;   CATARACT EXTRACTION W/ INTRAOCULAR LENS  IMPLANT, BILATERAL  2013   CERVICAL FUSION  05/11/2005   C3  --  C7/  due to post op quadriparesis same day s/p  anterior C 4,5,6, colpectomy, decompression, removal epidural hematoma, foraminnotomy, cage and plate   COLONOSCOPY  last one 04-07-2014   COLONOSCOPY WITH PROPOFOL N/A 09/21/2022   Procedure: COLONOSCOPY WITH PROPOFOL;  Surgeon: Corbin Ade, MD;  [facility];  Service: Endoscopy;  Laterality: N/A;   CYSTOSCOPY W/ RETROGRADES Bilateral 08/19/2013   Procedure: CYSTOSCOPY WITH RETROGRADE PYELOGRAM;  Surgeon: Milford Cage, MD;  Location: [facility];  Service: Urology;  Laterality: Bilateral;   CYSTOSCOPY W/ RETROGRADES Bilateral 07/18/2016   Procedure: CYSTOSCOPY WITH RETROGRADE PYELOGRAM;  Surgeon: Sebastian Ache, MD;  Location: [facility];  Service: Urology;  Laterality: Bilateral;   ENDOSCOPIC REPAIR CSF LEAK VIA NASAL PASSAGE  2011   ESOPHAGOGASTRODUODENOSCOPY (EGD) WITH PROPOFOL N/A 09/21/2022   Procedure: ESOPHAGOGASTRODUODENOSCOPY (EGD) WITH PROPOFOL;  Surgeon: Corbin Ade, MD;  [facility];  Service: Endoscopy;  Laterality: N/A;   HEMOSTASIS CLIP PLACEMENT  09/21/2022   Procedure: HEMOSTASIS CLIP PLACEMENT;  Surgeon: Corbin Ade, MD;  [facility];  Service: Endoscopy;;   INTRAOPERATIVE ARTERIOGRAM  CATH LAB  01-05-2009  DR Jacinto Halim   RICA   ACUTE ANGLE 80-85% STENOSIS   KNEE ARTHROSCOPY Left 07/15/2017    Procedure: ARTHROSCOPY LEFT  KNEE AND DEBRIDEMENT, medial meniscal tear and chondromalaita;  Surgeon: Durene Romans, MD;  [facility];  Service: Orthopedics;  Laterality: Left;  60 MINS   LUMBAR FUSION  03-26-2012   L3 --  L5   POLYPECTOMY  09/21/2022   Procedure: POLYPECTOMY;  Surgeon: Corbin Ade, MD;  [facility];  Service: Endoscopy;;   RADIOACTIVE PROSTATE SEED IMPLANTS  08-19-2003   SEPTOPLASTY  1998   TOTAL KNEE ARTHROPLASTY Left 02/25/2018   Procedure: LEFT TOTAL KNEE ARTHROPLASTY;  Surgeon: Durene Romans, MD;  [facility];  Service: Orthopedics;  Laterality: Left;  70 mins   TRANSCAROTID ARTERY REVASCULARIZATION  Right 05/11/2022   Procedure: Right Transcarotid Artery Revascularization;  Surgeon: Maeola Harman, MD;  Location: [facility];  Service: Vascular;  Laterality: Right;   TRANSURETHRAL RESECTION OF BLADDER TUMOR N/A 08/19/2013   Procedure: TRANSURETHRAL RESECTION OF BLADDER TUMOR (TURBT);  Surgeon: Milford Cage, MD;  Location: [facility];  Service: Urology;  Laterality: N/A;   TRANSURETHRAL RESECTION OF BLADDER TUMOR N/A 07/18/2016   Procedure: TRANSURETHRAL RESECTION OF BLADDER TUMOR (TURBT);  Surgeon: Sebastian Ache, MD;  Location: [facility];  Service: Urology;  Laterality: N/A;   YAG LASER APPLICATION  07/29/2012   Procedure: YAG LASER APPLICATION;  Surgeon: Loraine Leriche T. Nile Riggs, MD;  [facility];  Service: Ophthalmology;  Laterality: Right;    12 system ROS was negative unless otherwise noted in HPI   Observations/Objective:  Patient demonstrates good understanding of our conversation with his daughter present  CT IMPRESSION: 1. 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 2. Patent right carotid stent.  Assessment and Plan: 77 year old male with high-grade asymptomatic left ICA stenosis appears to be a wedding and is focal just past the bifurcation.  I have  discussed his options being medical therapy versus carotid endarterectomy versus stenting from either transcarotid or transfemoral approach.  As patient has previously tolerated transcarotid artery stenting in the past he has elected for this approach.  He will continue aspirin Plavix and statin and we will schedule for the near future.  We again reiterated the risk benefits alternatives and he demonstrates good understanding.   I spent 8 minutes with the patient via telephone encounter.   Signed, Lemar Livings [facility] Head Office: [phone]  06/26/2023, 4:00 PM

## 2023-06-26 NOTE — H&P (View-Only) (Signed)
Virtual Visit via Telephone Note  I connected with [name] on 06/26/2023 using the Doxy[employer]me by telephone and verified that I was speaking with the correct person using two identifiers. Patient was located at at home and accompanied by his daughter. I am located at the office alone.   Chief Complaint: no complaints today  History of Present Illness: [name] is a 77 y.o. male with history of right transcarotid artery stenting now with high-grade left ICA stenosis that remains asymptomatic.  His wife recently passed away after our last visit.  Patient continues on aspirin Plavix and statin.  Past Medical History:  Diagnosis Date   Bilateral renal cysts    Bladder cancer (HCC) UROLOGIST-  DR [facility]   RECURRENT 11/ 2017 --  hx turbt in 2004 and 08-19-2013   BPH without obstruction/lower urinary tract symptoms    Cervical fusion syndrome    LEFT ARM NUMBNESS--  CONTROLLED W/ GABAPENTIN   Coronary artery disease    CARDIOLOGIST-- DR Wyline Mood Lorelle Formosa)   DDD (degenerative disc disease), cervical    DDD (degenerative disc disease), lumbar    Diverticulosis of sigmoid colon    Heart murmur    slight murmur per patient   History of acute gouty arthritis    History of adenomatous polyp of colon    2001;  2012- non-malignant leiomyoma;  04-07-2014  tubualr adenoma and hyperplastic polyp's   History of prostate cancer UROLOGIST-  DR [facility]   S/P  RADIOACTIVE SEED IMPLANTS 2004   HOH (hard of hearing)    no hearing aids   Hyperlipidemia    Hypertension    OA (osteoarthritis)    Occlusion and stenosis of carotid artery without mention of cerebral infarction CARDIOLOGIST  -- DR  Christiane Ha BRANCH   LAST DUPLEX  11-23-2015  RICA  (DUPLEX DOPPLER 01-20-2016 RICA 60-79% PROXIMAL) /   LICA <50% (HAD CONSULT W/ DR Imogene Burn , VASCULAR SURGEON 01-20-2016)   OSA on CPAP    BiPAP   Prostate cancer (HCC)    per patient "years ago"- had radiation implant   Spondylosis, cervical     Type 2 diabetes mellitus (HCC)    Wears glasses     Past Surgical History:  Procedure Laterality Date   ANTERIOR REMOVAL CAGE AND PLATE W4-X3/ CORPECTOMY C7 (FX)/  ALLOGRAFT AND FUSION C3 -- T1/  POSTERIOR DECOMPRESSION BILATERAL LAMINECTOMY C4 -- 6 & PARTIAL C3/  POSTEROLATERAL ARTHRODESIS C3 - T1  06/05/2005   APPENDECTOMY  1990'S   BIOPSY  09/21/2022   Procedure: BIOPSY;  Surgeon: Corbin Ade, MD;  [facility];  Service: Endoscopy;;   CATARACT EXTRACTION W/ INTRAOCULAR LENS  IMPLANT, BILATERAL  2013   CERVICAL FUSION  05/11/2005   C3  --  C7/  due to post op quadriparesis same day s/p  anterior C 4,5,6, colpectomy, decompression, removal epidural hematoma, foraminnotomy, cage and plate   COLONOSCOPY  last one 04-07-2014   COLONOSCOPY WITH PROPOFOL N/A 09/21/2022   Procedure: COLONOSCOPY WITH PROPOFOL;  Surgeon: Corbin Ade, MD;  [facility];  Service: Endoscopy;  Laterality: N/A;   CYSTOSCOPY W/ RETROGRADES Bilateral 08/19/2013   Procedure: CYSTOSCOPY WITH RETROGRADE PYELOGRAM;  Surgeon: Milford Cage, MD;  Location: [facility];  Service: Urology;  Laterality: Bilateral;   CYSTOSCOPY W/ RETROGRADES Bilateral 07/18/2016   Procedure: CYSTOSCOPY WITH RETROGRADE PYELOGRAM;  Surgeon: Sebastian Ache, MD;  Location: [facility];  Service: Urology;  Laterality: Bilateral;   ENDOSCOPIC REPAIR CSF LEAK VIA NASAL PASSAGE  2011   ESOPHAGOGASTRODUODENOSCOPY (EGD) WITH PROPOFOL N/A 09/21/2022   Procedure: ESOPHAGOGASTRODUODENOSCOPY (EGD) WITH PROPOFOL;  Surgeon: Corbin Ade, MD;  [facility];  Service: Endoscopy;  Laterality: N/A;   HEMOSTASIS CLIP PLACEMENT  09/21/2022   Procedure: HEMOSTASIS CLIP PLACEMENT;  Surgeon: Corbin Ade, MD;  [facility];  Service: Endoscopy;;   INTRAOPERATIVE ARTERIOGRAM  CATH LAB  01-05-2009  DR Jacinto Halim   RICA   ACUTE ANGLE 80-85% STENOSIS   KNEE ARTHROSCOPY Left 07/15/2017    Procedure: ARTHROSCOPY LEFT  KNEE AND DEBRIDEMENT, medial meniscal tear and chondromalaita;  Surgeon: Durene Romans, MD;  [facility];  Service: Orthopedics;  Laterality: Left;  60 MINS   LUMBAR FUSION  03-26-2012   L3 --  L5   POLYPECTOMY  09/21/2022   Procedure: POLYPECTOMY;  Surgeon: Corbin Ade, MD;  [facility];  Service: Endoscopy;;   RADIOACTIVE PROSTATE SEED IMPLANTS  08-19-2003   SEPTOPLASTY  1998   TOTAL KNEE ARTHROPLASTY Left 02/25/2018   Procedure: LEFT TOTAL KNEE ARTHROPLASTY;  Surgeon: Durene Romans, MD;  [facility];  Service: Orthopedics;  Laterality: Left;  70 mins   TRANSCAROTID ARTERY REVASCULARIZATION  Right 05/11/2022   Procedure: Right Transcarotid Artery Revascularization;  Surgeon: Maeola Harman, MD;  Location: [facility];  Service: Vascular;  Laterality: Right;   TRANSURETHRAL RESECTION OF BLADDER TUMOR N/A 08/19/2013   Procedure: TRANSURETHRAL RESECTION OF BLADDER TUMOR (TURBT);  Surgeon: Milford Cage, MD;  Location: [facility];  Service: Urology;  Laterality: N/A;   TRANSURETHRAL RESECTION OF BLADDER TUMOR N/A 07/18/2016   Procedure: TRANSURETHRAL RESECTION OF BLADDER TUMOR (TURBT);  Surgeon: Sebastian Ache, MD;  Location: [facility];  Service: Urology;  Laterality: N/A;   YAG LASER APPLICATION  07/29/2012   Procedure: YAG LASER APPLICATION;  Surgeon: Loraine Leriche T. Nile Riggs, MD;  [facility];  Service: Ophthalmology;  Laterality: Right;    12 system ROS was negative unless otherwise noted in HPI   Observations/Objective:  Patient demonstrates good understanding of our conversation with his daughter present  CT IMPRESSION: 1. 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 2. Patent right carotid stent.  Assessment and Plan: 77 year old male with high-grade asymptomatic left ICA stenosis appears to be a wedding and is focal just past the bifurcation.  I have  discussed his options being medical therapy versus carotid endarterectomy versus stenting from either transcarotid or transfemoral approach.  As patient has previously tolerated transcarotid artery stenting in the past he has elected for this approach.  He will continue aspirin Plavix and statin and we will schedule for the near future.  We again reiterated the risk benefits alternatives and he demonstrates good understanding.   I spent 8 minutes with the patient via telephone encounter.   Signed, Lemar Livings [facility] Head Office: [phone]  06/26/2023, 4:00 PM

## 2023-07-01 ENCOUNTER — Other Ambulatory Visit: Payer: Self-pay

## 2023-07-01 DIAGNOSIS — I6522 Occlusion and stenosis of left carotid artery: Secondary | ICD-10-CM

## 2023-07-10 NOTE — Pre-Procedure Instructions (Signed)
Surgical Instructions   Your procedure is scheduled on Friday, November 1st. Report to [facility] Main Entrance "A" at 05:30 A.M., then check in with the Admitting office. Any questions or running late day of surgery: call [phone]  Questions prior to your surgery date: call [phone], Monday-Friday, 8am-4pm. If you experience any cold or flu symptoms such as cough, fever, chills, shortness of breath, etc. between now and your scheduled surgery, please notify us at the above number.     Remember:  Do not eat or drink after midnight the night before your surgery     Take these medicines the morning of surgery with A SIP OF WATER  amLODipine (NORVASC)  clopidogrel (PLAVIX)  gabapentin (NEURONTIN)  rosuvastatin (CRESTOR)  tamsulosin [facility])   May take these medicines IF NEEDED: acetaminophen (TYLENOL)  albuterol (VENTOLIN HFA)- bring inhaler with you on day of surgery colchicine    One week prior to surgery, STOP taking any Aleve, Naproxen, Ibuprofen, Motrin, celecoxib (CELEBREX), Advil, Goody's, BC's, all herbal medications, fish oil, and non-prescription vitamins.   WHAT DO I DO ABOUT MY DIABETES MEDICATION?   Do not take metFORMIN (GLUCOPHAGE-XR) the morning of surgery.    HOW TO MANAGE YOUR DIABETES BEFORE AND AFTER SURGERY  Why is it important to control my blood sugar before and after surgery? Improving blood sugar levels before and after surgery helps healing and can limit problems. A way of improving blood sugar control is eating a healthy diet by:  Eating less sugar and carbohydrates  Increasing activity/exercise  Talking with your doctor about reaching your blood sugar goals High blood sugars (greater than 180 mg/dL) can raise your risk of infections and slow your recovery, so you will need to focus on controlling your diabetes during the weeks before surgery. Make sure that the doctor who takes care of your diabetes knows about your planned surgery  including the date and location.  How do I manage my blood sugar before surgery? Check your blood sugar at least 4 times a day, starting 2 days before surgery, to make sure that the level is not too high or low.  Check your blood sugar the morning of your surgery when you wake up and every 2 hours until you get to the Short Stay unit.  If your blood sugar is less than 70 mg/dL, you will need to treat for low blood sugar: Do not take insulin. Treat a low blood sugar (less than 70 mg/dL) with  cup of clear juice (cranberry or apple), 4 glucose tablets, OR glucose gel. Recheck blood sugar in 15 minutes after treatment (to make sure it is greater than 70 mg/dL). If your blood sugar is not greater than 70 mg/dL on recheck, call [phone] for further instructions. Report your blood sugar to the short stay nurse when you get to Short Stay.  If you are admitted to the hospital after surgery: Your blood sugar will be checked by the staff and you will probably be given insulin after surgery (instead of oral diabetes medicines) to make sure you have good blood sugar levels. The goal for blood sugar control after surgery is 80-180 mg/dL.                    Do NOT Smoke (Tobacco/Vaping) for 24 hours prior to your procedure.  If you use a CPAP at night, you may bring your mask/headgear for your overnight stay.   You will be asked to remove any contacts, glasses, piercing's, hearing  aid's, dentures/partials prior to surgery. Please bring cases for these items if needed.    Patients discharged the day of surgery will not be allowed to drive home, and someone needs to stay with them for 24 hours.  SURGICAL WAITING ROOM VISITATION Patients may have no more than 2 support people in the waiting area - these visitors may rotate.   Pre-op nurse will coordinate an appropriate time for 1 ADULT support person, who may not rotate, to accompany patient in pre-op.  Children under the age of 22 must have an adult  with them who is not the patient and must remain in the main waiting area with an adult.  If the patient needs to stay at the hospital during part of their recovery, the visitor guidelines for inpatient rooms apply.  Please refer to the [facility] website for the visitor guidelines for any additional information.   If you received a COVID test during your pre-op visit  it is requested that you wear a mask when out in public, stay away from anyone that may not be feeling well and notify your surgeon if you develop symptoms. If you have been in contact with anyone that has tested positive in the last 10 days please notify you surgeon.      Pre-operative CHG Bathing Instructions   You can play a key role in reducing the risk of infection after surgery. Your skin needs to be as free of germs as possible. You can reduce the number of germs on your skin by washing with CHG (chlorhexidine gluconate) soap before surgery. CHG is an antiseptic soap that kills germs and continues to kill germs even after washing.   DO NOT use if you have an allergy to chlorhexidine/CHG or antibacterial soaps. If your skin becomes reddened or irritated, stop using the CHG and notify one of our RNs at [phone].              TAKE A SHOWER THE NIGHT BEFORE SURGERY AND THE DAY OF SURGERY    Please keep in mind the following:  DO NOT shave, including legs and underarms, 48 hours prior to surgery.   You may shave your face before/day of surgery.  Place clean sheets on your bed the night before surgery Use a clean washcloth (not used since being washed) for each shower. DO NOT sleep with pet's night before surgery.  CHG Shower Instructions:  Wash your face and private area with normal soap. If you choose to wash your hair, wash first with your normal shampoo.  After you use shampoo/soap, rinse your hair and body thoroughly to remove shampoo/soap residue.  Turn the water OFF and apply half the bottle of CHG soap to a  CLEAN washcloth.  Apply CHG soap ONLY FROM YOUR NECK DOWN TO YOUR TOES (washing for 3-5 minutes)  DO NOT use CHG soap on face, private areas, open wounds, or sores.  Pay special attention to the area where your surgery is being performed.  If you are having back surgery, having someone wash your back for you may be helpful. Wait 2 minutes after CHG soap is applied, then you may rinse off the CHG soap.  Pat dry with a clean towel  Put on clean pajamas    Additional instructions for the day of surgery: DO NOT APPLY any lotions, deodorants, cologne, or perfumes.   Do not wear jewelry or makeup Do not wear nail polish, gel polish, artificial nails, or any other type of covering  on natural nails (fingers and toes) Do not bring valuables to the hospital. [facility] is not responsible for valuables/personal belongings. Put on clean/comfortable clothes.  Please brush your teeth.  Ask your nurse before applying any prescription medications to the skin.

## 2023-07-11 ENCOUNTER — Other Ambulatory Visit: Payer: Self-pay

## 2023-07-11 ENCOUNTER — Encounter (HOSPITAL_COMMUNITY): Payer: Self-pay

## 2023-07-11 ENCOUNTER — Encounter (HOSPITAL_COMMUNITY)
Admission: RE | Admit: 2023-07-11 | Discharge: 2023-07-11 | Disposition: A | Discharge status: Home / Self Care | Payer: Medicare HMO | Source: Ambulatory Visit | Attending: Vascular Surgery | Admitting: Vascular Surgery

## 2023-07-11 VITALS — BP 122/62 | HR 79 | Temp 98.4°F | Resp 18 | Ht 67.0 in | Wt 180.7 lb

## 2023-07-11 DIAGNOSIS — I350 Nonrheumatic aortic (valve) stenosis: Secondary | ICD-10-CM | POA: Diagnosis present

## 2023-07-11 DIAGNOSIS — I6522 Occlusion and stenosis of left carotid artery: Secondary | ICD-10-CM | POA: Insufficient documentation

## 2023-07-11 DIAGNOSIS — I2510 Atherosclerotic heart disease of native coronary artery without angina pectoris: Secondary | ICD-10-CM | POA: Insufficient documentation

## 2023-07-11 DIAGNOSIS — E785 Hyperlipidemia, unspecified: Secondary | ICD-10-CM | POA: Diagnosis not present

## 2023-07-11 DIAGNOSIS — E119 Type 2 diabetes mellitus without complications: Secondary | ICD-10-CM | POA: Diagnosis not present

## 2023-07-11 DIAGNOSIS — I10 Essential (primary) hypertension: Secondary | ICD-10-CM | POA: Diagnosis not present

## 2023-07-11 DIAGNOSIS — Z01812 Encounter for preprocedural laboratory examination: Secondary | ICD-10-CM | POA: Diagnosis not present

## 2023-07-11 DIAGNOSIS — Z01818 Encounter for other preprocedural examination: Secondary | ICD-10-CM

## 2023-07-11 HISTORY — DX: Other complications of anesthesia, initial encounter: T88.59XA

## 2023-07-11 HISTORY — DX: Peripheral vascular disease, unspecified: I73.9

## 2023-07-11 HISTORY — DX: Anemia, unspecified: D64.9

## 2023-07-11 HISTORY — DX: Other specified postprocedural states: Z98.890

## 2023-07-11 HISTORY — DX: Nausea with vomiting, unspecified: R11.2

## 2023-07-11 LAB — CBC
HCT: 38 % — ABNORMAL LOW (ref 39.0–52.0)
Hemoglobin: 12.8 g/dL — ABNORMAL LOW (ref 13.0–17.0)
MCH: 32.2 pg (ref 26.0–34.0)
MCHC: 33.7 g/dL (ref 30.0–36.0)
MCV: 95.7 fL (ref 80.0–100.0)
Platelets: 160 10*3/uL (ref 150–400)
RBC: 3.97 MIL/uL — ABNORMAL LOW (ref 4.22–5.81)
RDW: 14 % (ref 11.5–15.5)
WBC: 6.5 10*3/uL (ref 4.0–10.5)
nRBC: 0 % (ref 0.0–0.2)

## 2023-07-11 LAB — URINALYSIS, ROUTINE W REFLEX MICROSCOPIC
Bilirubin Urine: NEGATIVE
Glucose, UA: NEGATIVE mg/dL
Hgb urine dipstick: NEGATIVE
Ketones, ur: NEGATIVE mg/dL
Leukocytes,Ua: NEGATIVE
Nitrite: NEGATIVE
Protein, ur: NEGATIVE mg/dL
Specific Gravity, Urine: 1.005 (ref 1.005–1.030)
pH: 7 (ref 5.0–8.0)

## 2023-07-11 LAB — CBC WITH DIFFERENTIAL/PLATELET
Absolute Lymphocytes: 1426 {cells}/uL (ref 850–3900)
Absolute Monocytes: 719 {cells}/uL (ref 200–950)
Basophils Absolute: 53 {cells}/uL (ref 0–200)
Basophils Relative: 0.8 %
Eosinophils Absolute: 389 {cells}/uL (ref 15–500)
Eosinophils Relative: 5.9 %
HCT: 37.6 % — ABNORMAL LOW (ref 38.5–50.0)
Hemoglobin: 12.3 g/dL — ABNORMAL LOW (ref 13.2–17.1)
MCH: 31.7 pg (ref 27.0–33.0)
MCHC: 32.7 g/dL (ref 32.0–36.0)
MCV: 96.9 fL (ref 80.0–100.0)
MPV: 11.5 fL (ref 7.5–12.5)
Monocytes Relative: 10.9 %
Neutro Abs: 4013 {cells}/uL (ref 1500–7800)
Neutrophils Relative %: 60.8 %
Platelets: 153 10*3/uL (ref 140–400)
RBC: 3.88 10*6/uL — ABNORMAL LOW (ref 4.20–5.80)
RDW: 12.8 % (ref 11.0–15.0)
Total Lymphocyte: 21.6 %
WBC: 6.6 10*3/uL (ref 3.8–10.8)

## 2023-07-11 LAB — TYPE AND SCREEN
ABO/RH(D): O POS
Antibody Screen: NEGATIVE

## 2023-07-11 LAB — COMPREHENSIVE METABOLIC PANEL
ALT: 35 U/L (ref 0–44)
AST: 31 U/L (ref 15–41)
Albumin: 4.2 g/dL (ref 3.5–5.0)
Alkaline Phosphatase: 55 U/L (ref 38–126)
Anion gap: 9 (ref 5–15)
BUN: 19 mg/dL (ref 8–23)
CO2: 27 mmol/L (ref 22–32)
Calcium: 9.5 mg/dL (ref 8.9–10.3)
Chloride: 106 mmol/L (ref 98–111)
Creatinine, Ser: 1.14 mg/dL (ref 0.61–1.24)
GFR, Estimated: >60 mL/min (ref >60)
Glucose, Bld: 131 mg/dL — ABNORMAL HIGH (ref 70–99)
Potassium: 4.1 mmol/L (ref 3.5–5.1)
Sodium: 142 mmol/L (ref 135–145)
Total Bilirubin: 0.6 mg/dL (ref 0.3–1.2)
Total Protein: 7.2 g/dL (ref 6.5–8.1)

## 2023-07-11 LAB — GLUCOSE, CAPILLARY: Glucose-Capillary: 140 mg/dL — ABNORMAL HIGH (ref 70–99)

## 2023-07-11 LAB — SURGICAL PCR SCREEN
MRSA, PCR: NEGATIVE
Staphylococcus aureus: NEGATIVE

## 2023-07-11 LAB — HEMOGLOBIN A1C
Hgb A1c MFr Bld: 6.1 % — ABNORMAL HIGH (ref 4.8–5.6)
Mean Plasma Glucose: 128.37 mg/dL

## 2023-07-11 LAB — PROTIME-INR
INR: 1 (ref 0.8–1.2)
Prothrombin Time: 13.3 s (ref 11.4–15.2)

## 2023-07-11 LAB — FERRITIN: Ferritin: 29 ng/mL (ref 24–380)

## 2023-07-11 LAB — APTT: aPTT: 34 s (ref 24–36)

## 2023-07-11 NOTE — Progress Notes (Signed)
PCP - Dr. Nita Sells Cardiologist - Dr. Dina Rich - Last office visit 02/20/2023  PPM/ICD - Denies Device Orders - n/a Rep Notified - n/a  Chest x-ray - n/a EKG - 09/19/2022 Stress Test - 06/07/2017 ECHO - 07/19/2022 Cardiac Cath - 05/11/2017  Sleep Study - +OSA. Wears CPAP nightly. Pt does not know pressure settings  Pt is DM2. He checks his blood sugar only when he is feeling symptomatic. Normal fasting range is around 120. CBG at pre-op 140. A1c result pending.  Last dose of GLP1 agonist- n/a GLP1 instructions: n/a  Blood Thinner Instructions: Pt will continue to take his Plavix through the day of surgery Aspirin Instructions: Pt will continue to take his Aspirin through the day of surgery   NPO after midnight  COVID TEST- n/a   Anesthesia review: Yes. Cardiac Hx including HTN and CAd, DM2 and OSA on CPAP.   Patient denies shortness of breath, fever, cough and chest pain at PAT appointment. Pt denies any respiratory illness/infection in the last two months.   All instructions explained to the patient, with a verbal understanding of the material. Patient agrees to go over the instructions while at home for a better understanding. Patient also instructed to self quarantine after being tested for COVID-19. The opportunity to ask questions was provided.

## 2023-07-11 NOTE — Pre-Procedure Instructions (Signed)
Surgical Instructions   Your procedure is scheduled on Friday, November 1st. Report to [facility] Main Entrance "A" at 05:30 A.M., then check in with the Admitting office. Any questions or running late day of surgery: call [phone]  Questions prior to your surgery date: call [phone], Monday-Friday, 8am-4pm. If you experience any cold or flu symptoms such as cough, fever, chills, shortness of breath, etc. between now and your scheduled surgery, please notify us at the above number.     Remember:  Do not eat or drink after midnight the night before your surgery     Take these medicines the morning of surgery with A SIP OF WATER  amLODipine (NORVASC)  clopidogrel (PLAVIX)  gabapentin (NEURONTIN)  rosuvastatin (CRESTOR)  tamsulosin (FLOMAX)  aspirin  May take these medicines IF NEEDED: acetaminophen (TYLENOL)  albuterol (VENTOLIN HFA)- bring inhaler with you on day of surgery colchicine    One week prior to surgery, STOP taking any Aleve, Naproxen, Ibuprofen, Motrin, celecoxib (CELEBREX), Advil, Goody's, BC's, all herbal medications, fish oil, and non-prescription vitamins.   WHAT DO I DO ABOUT MY DIABETES MEDICATION?   Do not take metFORMIN (GLUCOPHAGE-XR) the morning of surgery.    HOW TO MANAGE YOUR DIABETES BEFORE AND AFTER SURGERY  Why is it important to control my blood sugar before and after surgery? Improving blood sugar levels before and after surgery helps healing and can limit problems. A way of improving blood sugar control is eating a healthy diet by:  Eating less sugar and carbohydrates  Increasing activity/exercise  Talking with your doctor about reaching your blood sugar goals High blood sugars (greater than 180 mg/dL) can raise your risk of infections and slow your recovery, so you will need to focus on controlling your diabetes during the weeks before surgery. Make sure that the doctor who takes care of your diabetes knows about your planned  surgery including the date and location.  How do I manage my blood sugar before surgery? Check your blood sugar at least 4 times a day, starting 2 days before surgery, to make sure that the level is not too high or low.  Check your blood sugar the morning of your surgery when you wake up and every 2 hours until you get to the Short Stay unit.  If your blood sugar is less than 70 mg/dL, you will need to treat for low blood sugar: Do not take insulin. Treat a low blood sugar (less than 70 mg/dL) with  cup of clear juice (cranberry or apple), 4 glucose tablets, OR glucose gel. Recheck blood sugar in 15 minutes after treatment (to make sure it is greater than 70 mg/dL). If your blood sugar is not greater than 70 mg/dL on recheck, call [phone] for further instructions. Report your blood sugar to the short stay nurse when you get to Short Stay.  If you are admitted to the hospital after surgery: Your blood sugar will be checked by the staff and you will probably be given insulin after surgery (instead of oral diabetes medicines) to make sure you have good blood sugar levels. The goal for blood sugar control after surgery is 80-180 mg/dL.                    Do NOT Smoke (Tobacco/Vaping) for 24 hours prior to your procedure.  If you use a CPAP at night, you may bring your mask/headgear for your overnight stay.   You will be asked to remove any contacts, glasses, piercing's,  hearing aid's, dentures/partials prior to surgery. Please bring cases for these items if needed.    Patients discharged the day of surgery will not be allowed to drive home, and someone needs to stay with them for 24 hours.  SURGICAL WAITING ROOM VISITATION Patients may have no more than 2 support people in the waiting area - these visitors may rotate.   Pre-op nurse will coordinate an appropriate time for 1 ADULT support person, who may not rotate, to accompany patient in pre-op.  Children under the age of 15 must have  an adult with them who is not the patient and must remain in the main waiting area with an adult.  If the patient needs to stay at the hospital during part of their recovery, the visitor guidelines for inpatient rooms apply.  Please refer to the [facility] website for the visitor guidelines for any additional information.   If you received a COVID test during your pre-op visit  it is requested that you wear a mask when out in public, stay away from anyone that may not be feeling well and notify your surgeon if you develop symptoms. If you have been in contact with anyone that has tested positive in the last 10 days please notify you surgeon.      Pre-operative CHG Bathing Instructions   You can play a key role in reducing the risk of infection after surgery. Your skin needs to be as free of germs as possible. You can reduce the number of germs on your skin by washing with CHG (chlorhexidine gluconate) soap before surgery. CHG is an antiseptic soap that kills germs and continues to kill germs even after washing.   DO NOT use if you have an allergy to chlorhexidine/CHG or antibacterial soaps. If your skin becomes reddened or irritated, stop using the CHG and notify one of our RNs at [phone].              TAKE A SHOWER THE NIGHT BEFORE SURGERY AND THE DAY OF SURGERY    Please keep in mind the following:  DO NOT shave, including legs and underarms, 48 hours prior to surgery.   You may shave your face before/day of surgery.  Place clean sheets on your bed the night before surgery Use a clean washcloth (not used since being washed) for each shower. DO NOT sleep with pet's night before surgery.  CHG Shower Instructions:  Wash your face and private area with normal soap. If you choose to wash your hair, wash first with your normal shampoo.  After you use shampoo/soap, rinse your hair and body thoroughly to remove shampoo/soap residue.  Turn the water OFF and apply half the bottle of CHG  soap to a CLEAN washcloth.  Apply CHG soap ONLY FROM YOUR NECK DOWN TO YOUR TOES (washing for 3-5 minutes)  DO NOT use CHG soap on face, private areas, open wounds, or sores.  Pay special attention to the area where your surgery is being performed.  If you are having back surgery, having someone wash your back for you may be helpful. Wait 2 minutes after CHG soap is applied, then you may rinse off the CHG soap.  Pat dry with a clean towel  Put on clean pajamas    Additional instructions for the day of surgery: DO NOT APPLY any lotions, deodorants, cologne, or perfumes.   Do not wear jewelry or makeup Do not wear nail polish, gel polish, artificial nails, or any other type of  covering on natural nails (fingers and toes) Do not bring valuables to the hospital. [facility] is not responsible for valuables/personal belongings. Put on clean/comfortable clothes.  Please brush your teeth.  Ask your nurse before applying any prescription medications to the skin.

## 2023-07-12 NOTE — Anesthesia Preprocedure Evaluation (Signed)
Anesthesia Evaluation  Patient identified by MRN, date of birth, ID band Patient awake    Reviewed: Allergy & Precautions, H&P , NPO status , Patient's Chart, lab work & pertinent test results  History of Anesthesia Complications (+) PONV and history of anesthetic complications  Airway Mallampati: II  TM Distance: >3 FB Neck ROM: Full    Dental no notable dental hx.    Pulmonary sleep apnea , Current Smoker and Patient abstained from smoking.   Pulmonary exam normal breath sounds clear to auscultation       Cardiovascular hypertension, + CAD and + Peripheral Vascular Disease  Normal cardiovascular exam+ Valvular Problems/Murmurs AS  Rhythm:Regular Rate:Normal  Carotid stenosis   Neuro/Psych negative neurological ROS  negative psych ROS   GI/Hepatic negative GI ROS, Neg liver ROS,,,  Endo/Other  negative endocrine ROSdiabetes    Renal/GU Renal disease  negative genitourinary   Musculoskeletal  (+) Arthritis ,    Abdominal   Peds negative pediatric ROS (+)  Hematology  (+) Blood dyscrasia, anemia   Anesthesia Other Findings   Reproductive/Obstetrics negative OB ROS                             Anesthesia Physical Anesthesia Plan  ASA: 3  Anesthesia Plan: General   Post-op Pain Management:    Induction: Intravenous  PONV Risk Score and Plan: Ondansetron and Dexamethasone  Airway Management Planned: Oral ETT  Additional Equipment: Arterial line  Intra-op Plan:   Post-operative Plan: Extubation in OR  Informed Consent: I have reviewed the patients History and Physical, chart, labs and discussed the procedure including the risks, benefits and alternatives for the proposed anesthesia with the patient or authorized representative who has indicated his/her understanding and acceptance.     Dental advisory given  Plan Discussed with: CRNA  Anesthesia Plan Comments: (PAT note  by Antionette Poles, PA-C: 77 yo male follows with cardiology for hx of HTN, HLD, CAD on CT with low risk stress test 2018, moderate AS (mean gradient 20 mmHg by echo 07/2022). Last seen by Dr. Wyline Mood 02/20/23. Noted some episodes of low BP at home, recommended spacing out AM meds. Otherwise no changes, 6 month followup recommended.   Follows with vascular surgery for hx of carotid stenosis s/p right TCAR by Dr. Randie Heinz 04/2022. Now with asymptomatic high grade stenosis on the left. TCAR recommended.   Other pertinent hx includes NIDDM2, PONV, OSA on CPAP, C3-T1 fusion.  Proep labs reviewed, mild anemia Hgb 12.8, otherwise unremarkable. A1c 6.1.  EKG 09/19/22: Normal sinus rhythm. Rate 90. nonspecific ST changes  CTA neck 04/22/23: IMPRESSION: 1. 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 2. Patent right carotid stent.  07/2022 echo 1. Left ventricular ejection fraction, by estimation, is 60 to 65%. The  left ventricle has normal function. The left ventricle has no regional  wall motion abnormalities. There is mild left ventricular hypertrophy.  Left ventricular diastolic parameters  are indeterminate.  2. Right ventricular systolic function is normal. The right ventricular  size is normal. There is normal pulmonary artery systolic pressure.  3. Aortic valve leaflets are densely calcified with moderate to severe  restriction of the leaflet mobility. There is moderate aortic valve  stenosis present with calculated aortic valve area of 1.15 cm2, Vmax 2.9  m/sec and mean pressure gradient of 20  mmHg.  4. The inferior vena cava is normal in size with greater  than 50%  respiratory variability, suggesting right atrial pressure of 3 mmHg.  05/2017 nuclear stress  No diagnostic ST segment changes indicating ischemia. No arrhythmias.  Moderate-sized, mild intensity, inferior and inferoseptal defects that are largely fixed with a partial region of reversibility  in the mid inferoseptal zone. Normal wall motion in this area argues against scar. Although diaphragmatic attenuation is likely playing a role, consider a small region of inferoseptal ischemia.  This is a low risk study.  Nuclear stress EF: 64%.   )        Anesthesia Quick Evaluation

## 2023-07-12 NOTE — Progress Notes (Signed)
Anesthesia Chart Review:  77 yo male follows with cardiology for hx of HTN, HLD, CAD on CT with low risk stress test 2018, moderate AS (mean gradient 20 mmHg by echo 07/2022). Last seen by Dr. Wyline Mood 02/20/23. Noted some episodes of low BP at home, recommended spacing out AM meds. Otherwise no changes, 6 month followup recommended.   Follows with vascular surgery for hx of carotid stenosis s/p right TCAR by Dr. Randie Heinz 04/2022. Now with asymptomatic high grade stenosis on the left. TCAR recommended.   Other pertinent hx includes NIDDM2, PONV, OSA on CPAP, C3-T1 fusion.  Proep labs reviewed, mild anemia Hgb 12.8, otherwise unremarkable. A1c 6.1.  EKG 09/19/22: Normal sinus rhythm. Rate 90. nonspecific ST changes  CTA neck 04/22/23: IMPRESSION: 1. 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 2. Patent right carotid stent.  07/2022 echo 1. Left ventricular ejection fraction, by estimation, is 60 to 65%. The  left ventricle has normal function. The left ventricle has no regional  wall motion abnormalities. There is mild left ventricular hypertrophy.  Left ventricular diastolic parameters  are indeterminate.   2. Right ventricular systolic function is normal. The right ventricular  size is normal. There is normal pulmonary artery systolic pressure.   3. Aortic valve leaflets are densely calcified with moderate to severe  restriction of the leaflet mobility. There is moderate aortic valve  stenosis present with calculated aortic valve area of 1.15 cm2, Vmax 2.9  m/sec and mean pressure gradient of 20  mmHg.   4. The inferior vena cava is normal in size with greater than 50%  respiratory variability, suggesting right atrial pressure of 3 mmHg.  05/2017 nuclear stress No diagnostic ST segment changes indicating ischemia. No arrhythmias. Moderate-sized, mild intensity, inferior and inferoseptal defects that are largely fixed with a partial region of  reversibility in the mid inferoseptal zone. Normal wall motion in this area argues against scar. Although diaphragmatic attenuation is likely playing a role, consider a small region of inferoseptal ischemia. This is a low risk study. Nuclear stress EF: 64%.     Zannie Cove [facility] Short Stay Center/Anesthesiology Phone [phone] 07/12/2023 2:09 PM

## 2023-07-16 ENCOUNTER — Ambulatory Visit: Payer: Medicare HMO | Admitting: Gastroenterology

## 2023-07-16 ENCOUNTER — Encounter: Payer: Self-pay | Admitting: Gastroenterology

## 2023-07-16 ENCOUNTER — Telehealth: Payer: Self-pay

## 2023-07-16 VITALS — BP 107/56 | HR 76 | Temp 98.7°F | Ht 67.0 in | Wt 181.8 lb

## 2023-07-16 DIAGNOSIS — D509 Iron deficiency anemia, unspecified: Secondary | ICD-10-CM | POA: Diagnosis not present

## 2023-07-16 DIAGNOSIS — E538 Deficiency of other specified B group vitamins: Secondary | ICD-10-CM | POA: Insufficient documentation

## 2023-07-16 DIAGNOSIS — D519 Vitamin B12 deficiency anemia, unspecified: Secondary | ICD-10-CM

## 2023-07-16 MED ORDER — PANTOPRAZOLE SODIUM 40 MG PO TBEC: 40.0000 mg | DELAYED_RELEASE_TABLET | Freq: Every day | ORAL | 3 refills | Status: DC

## 2023-07-16 NOTE — Progress Notes (Signed)
GI Office Note    Referring Provider: Benita Stabile, MD Primary Care Physician:  Benita Stabile, MD  Primary Gastroenterologist: Hennie Duos. Marletta Lor, DO   Chief Complaint   Chief Complaint  Patient presents with   Follow-up    History of Present Illness   [name] is a 77 y.o. male presenting today for follow-up.  Last seen in April 2024.  History of profound anemia (hemoglobin of 7) requiring hospitalization in January 2024, stool Hemoccult positive at that time.  Found to have ferritin of 11, B12 156.  Required blood transfusion.  At discharge his hemoglobin was 7.8.  Chronically on Celebrex, cannot function without it.  Also on daily aspirin 81 mg, Plavix 75 mg daily.  Currently on oral B12 1000 mcg daily.  Also on oral iron, ferrous sulfate 325 mg daily +/- Fusion Plus also.   EGD/colonoscopy January 2024 with H. pylori gastritis (confirmed eradication by stool antigen 12/2022). One 10mm polyp in the cecum removed, tubular adenoma.  3-year surveillance colonoscopy recommended.   Latest labs from July 11, 2023: Hemoglobin 12.8, up from 10.8 three months prior.  Ferritin 29.  Abdominal ultrasound May 2024 with no cholelithiasis, liver unremarkable, spleen normal in size.  Patient is scheduled for transcarotid artery revascularization on November 1.  Today:  Holding Celebrex for his procedure but now aching in his joints. Has steroid dose pack but on hold until after procedure. No longer on pantoprazole, only had related to H.pylori he believes. Wasn't on it for very long.  Denies any melena or rectal bleeding.  Bowels moving regularly with MiraLAX and stool softener.  No abdominal pain.  No heartburn, vomiting.  Wife passed away back in 2023/05/16.  His daughter-in-law, Bronson Curb, RN is helping him with his medications.     Medications   Current Outpatient Medications  Medication Sig Dispense Refill   acetaminophen (TYLENOL) 500 MG tablet Take 1,000 mg by mouth every  6 (six) hours as needed for moderate pain.     albuterol (VENTOLIN HFA) 108 (90 Base) MCG/ACT inhaler Inhale 2 puffs into the lungs every 4 (four) hours as needed for shortness of breath or wheezing.     allopurinol (ZYLOPRIM) 300 MG tablet Take 300 mg by mouth at bedtime.     amLODipine (NORVASC) 10 MG tablet TAKE 1 TABLET BY MOUTH EVERY DAY 90 tablet 3   aspirin 81 MG EC tablet Take 81 mg by mouth at bedtime.     celecoxib (CELEBREX) 200 MG capsule Take 200 mg by mouth 2 (two) times daily.     clopidogrel (PLAVIX) 75 MG tablet Take 75 mg by mouth daily.   6   colchicine 0.6 MG tablet Take 0.6 mg by mouth 2 (two) times daily as needed (gout).      cyanocobalamin (VITAMIN B12) 1000 MCG tablet Take 1 tablet (1,000 mcg total) by mouth daily. 30 tablet 1   docusate sodium (COLACE) 100 MG capsule Take 100 mg by mouth at bedtime.     ferrous sulfate 325 (65 FE) MG EC tablet Take 325 mg by mouth daily.     finasteride (PROSCAR) 5 MG tablet Take 5 mg by mouth at bedtime.     furosemide (LASIX) 40 MG tablet Take 40 mg by mouth every morning.     gabapentin (NEURONTIN) 300 MG capsule Take 300 mg by mouth 2 (two) times daily.     HYDROcodone bit-homatropine (HYCODAN) 5-1.5 MG/5ML syrup Take 5 mLs by mouth  every 6 (six) hours as needed for cough.     Iron-FA-B Cmp-C-Biot-Probiotic (FUSION PLUS) CAPS Take 1 capsule by mouth daily.     labetalol (NORMODYNE) 100 MG tablet Take 100 mg by mouth at bedtime.     losartan (COZAAR) 100 MG tablet Take 1 tablet (100 mg total) by mouth every evening. (Patient taking differently: Take 100 mg by mouth in the morning.) 30 tablet 6   metFORMIN (GLUCOPHAGE-XR) 500 MG 24 hr tablet Take 500 mg by mouth in the morning and at bedtime.     NON FORMULARY Pt uses cpap     Omega-3-6-9 CAPS Take 1 capsule by mouth 2 (two) times daily.     polyethylene glycol powder (GLYCOLAX/MIRALAX) powder TAKE 17MG (1CAPFUL) BY MOUTH(MIXED WITH WATER/JUICE) DAILY 527 g 0   rosuvastatin (CRESTOR)  10 MG tablet Take 10 mg by mouth every morning.      tamsulosin (FLOMAX) 0.4 MG CAPS capsule Take 0.4 mg by mouth daily.     Vitamin D, Ergocalciferol, (DRISDOL) 1.25 MG (50000 UNIT) CAPS capsule Take 50,000 Units by mouth every Thursday.     pantoprazole (PROTONIX) 40 MG tablet Take 1 tablet (40 mg total) by mouth 2 (two) times daily for 14 days. (Patient not taking: Reported on 07/03/2023) 28 tablet 0   No current facility-administered medications for this visit.    Allergies   Allergies as of 07/16/2023 - Review Complete 07/16/2023  Allergen Reaction Noted   Dilaudid [hydromorphone hcl] Other (See Comments) 04/06/2014   Lyrica [pregabalin] Nausea Only and Other (See Comments)    Toradol [ketorolac tromethamine] Other (See Comments) 02/04/2014   Tramadol  05/02/2022        Review of Systems   General: Negative for anorexia, weight loss, fever, chills, fatigue, weakness. ENT: Negative for hoarseness, difficulty swallowing , nasal congestion. CV: Negative for chest pain, angina, palpitations, dyspnea on exertion, peripheral edema.  Respiratory: Negative for dyspnea at rest, dyspnea on exertion, cough, sputum, wheezing.  GI: See history of present illness. GU:  Negative for dysuria, hematuria, urinary incontinence, urinary frequency, nocturnal urination.  Endo: Negative for unusual weight change.     Physical Exam   BP (!) 107/56 (BP Location: Right Arm, Patient Position: Sitting, Cuff Size: Normal)   Pulse 76   Temp 98.7 F (37.1 C) (Oral)   Ht 5\' 7"  (1.702 m)   Wt 181 lb 12.8 oz (82.5 kg)   SpO2 95%   BMI 28.47 kg/m    General: Well-nourished, well-developed in no acute distress.  Eyes: No icterus. Mouth: Oropharyngeal mucosa moist and pink   Lungs: Clear to auscultation bilaterally.  Heart: Regular rate and rhythm, no murmurs rubs or gallops.  Abdomen: Bowel sounds are normal, nontender, nondistended, no hepatosplenomegaly or masses,  no abdominal bruits or hernia ,  no rebound or guarding.  Rectal: not performed  Extremities: No lower extremity edema. No clubbing or deformities. Neuro: Alert and oriented x 4   Skin: Warm and dry, no jaundice.   Psych: Alert and cooperative, normal mood and affect.  Labs   Lab Results  Component Value Date   NA 142 07/11/2023   CL 106 07/11/2023   K 4.1 07/11/2023   CO2 27 07/11/2023   BUN 19 07/11/2023   CREATININE 1.14 07/11/2023   GFRNONAA >60 07/11/2023   CALCIUM 9.5 07/11/2023   ALBUMIN 4.2 07/11/2023   GLUCOSE 131 (H) 07/11/2023   Lab Results  Component Value Date   ALT 35 07/11/2023   AST 31  07/11/2023   ALKPHOS 55 07/11/2023   BILITOT 0.6 07/11/2023   Lab Results  Component Value Date   WBC 6.5 07/11/2023   HGB 12.8 (L) 07/11/2023   HCT 38.0 (L) 07/11/2023   MCV 95.7 07/11/2023   PLT 160 07/11/2023     FERRITIN 29 07/10/2023    Imaging Studies   MR PELVIS WO CONTRAST  Result Date: 07/03/2023 CLINICAL DATA:  Bilateral hip pain EXAM: MRI PELVIS WITHOUT CONTRAST TECHNIQUE: Multiplanar multisequence MR imaging of the pelvis was performed. No intravenous contrast was administered. COMPARISON:  None Available. FINDINGS: Bones: No hip fracture, dislocation or avascular necrosis. No periosteal reaction or bone destruction. No aggressive osseous lesion. Mild osteoarthritis of bilateral SI joints. No SI joint widening or erosive changes. Degenerative disease with disc height loss at L5-S1. Posterior lumbar fusion at L4-5. Articular cartilage and labrum Articular cartilage: Mild partial-thickness cartilage loss of the femoral head and acetabulum bilaterally. Labrum: Grossly intact, but evaluation is limited by lack of intraarticular fluid. Joint or bursal effusion Joint effusion:  No hip joint effusion.  No SI joint effusion. Bursae:  No bursal fluid. Muscles and tendons Flexors: Normal. Extensors: Normal. Abductors: Normal. Adductors: Normal. Gluteals: Normal. Hamstrings: Normal. Paraspinal: Mild T2  hyperintense signal in the lower paraspinal muscles likely secondary to denervation. Other findings No pelvic free fluid. No fluid collection or hematoma. No inguinal lymphadenopathy. No inguinal hernia. Diverticulosis without evidence of diverticulitis. IMPRESSION: 1. No hip fracture, dislocation or avascular necrosis. 2. Mild osteoarthritis of bilateral hips. 3. Mild osteoarthritis of bilateral SI joints. 4. Mild partial-thickness cartilage loss of the femoral head and acetabulum bilaterally. Electronically Signed   By: Elige Ko M.D.   On: 07/03/2023 06:28    Assessment/Plan:    Anemia: -Had both iron and B12 deficiency, on oral supplements currently -May be taking 2 iron supplements, encouraged him to take only 1 at this time.  Fusion plus would be preference but if he is out, he can take ferrous sulfate 325 mg daily. -We will recheck his labs in 3 months including CBC, iron/TIBC/ferritin, B12, folate -At this time he does not need small bowel capsule endoscopy however if he has decline in hemoglobin or persistent iron deficiency, would consider in the future -Restart pantoprazole 40 mg daily mostly for GI protection in the setting of aspirin/Plavix/Celebrex use    Leanna Battles. Melvyn Neth, MHS, PA-C [facility]

## 2023-07-16 NOTE — Telephone Encounter (Signed)
In 3 months: CBC, iron/tibc/ferritin, B12, folate. Dx: anemia, iron def, b12 def.

## 2023-07-16 NOTE — Telephone Encounter (Signed)
Pt was seen today, needing to know what labs you want this pt to have completed in 3 mths. Please advise.

## 2023-07-16 NOTE — Patient Instructions (Addendum)
Restart pantoprazole 40mg  daily before breakfast. This will help provide protection from gastritis and ulcers in setting of Plavix, aspirin, and Celebrex use. I have sent in prescription.  You need to take only ONE iron once daily. Your medication list shows two different irons, ferrous sulfate and Fusion Plus. Preference would be Fusion Plus if you have any left if not then ferrous sulfate 325mg  daily is ok.  We will update your labs in 3 months. If persistent iron deficiency or decline in hemoglobin, you may need small bowel capsule test. At this time it is not indicated.   Call with any questions or concerns.

## 2023-07-17 ENCOUNTER — Other Ambulatory Visit: Payer: Self-pay

## 2023-07-17 DIAGNOSIS — D509 Iron deficiency anemia, unspecified: Secondary | ICD-10-CM

## 2023-07-17 DIAGNOSIS — E538 Deficiency of other specified B group vitamins: Secondary | ICD-10-CM

## 2023-07-17 NOTE — Telephone Encounter (Signed)
Labs were ordered and will be sent to the pt to have completed in 3 mths

## 2023-07-19 ENCOUNTER — Other Ambulatory Visit: Payer: Self-pay

## 2023-07-19 ENCOUNTER — Inpatient Hospital Stay (HOSPITAL_COMMUNITY): Payer: Medicare HMO | Admitting: Physician Assistant

## 2023-07-19 ENCOUNTER — Inpatient Hospital Stay (HOSPITAL_COMMUNITY)
Admission: RE | Admit: 2023-07-19 | Discharge: 2023-07-20 | DRG: 068 | Disposition: A | Discharge status: Home / Self Care | Payer: Medicare HMO | Attending: Vascular Surgery | Admitting: Vascular Surgery

## 2023-07-19 ENCOUNTER — Encounter (HOSPITAL_COMMUNITY): Payer: Self-pay | Admitting: Vascular Surgery

## 2023-07-19 ENCOUNTER — Inpatient Hospital Stay (HOSPITAL_COMMUNITY): Payer: Medicare HMO

## 2023-07-19 ENCOUNTER — Encounter (HOSPITAL_COMMUNITY)
Admission: RE | Disposition: A | Discharge status: Home / Self Care | Payer: Self-pay | Source: Home / Self Care | Attending: Vascular Surgery

## 2023-07-19 DIAGNOSIS — D509 Iron deficiency anemia, unspecified: Secondary | ICD-10-CM | POA: Diagnosis present

## 2023-07-19 DIAGNOSIS — Z7982 Long term (current) use of aspirin: Secondary | ICD-10-CM | POA: Diagnosis not present

## 2023-07-19 DIAGNOSIS — N400 Enlarged prostate without lower urinary tract symptoms: Secondary | ICD-10-CM | POA: Diagnosis present

## 2023-07-19 DIAGNOSIS — Z8551 Personal history of malignant neoplasm of bladder: Secondary | ICD-10-CM | POA: Diagnosis not present

## 2023-07-19 DIAGNOSIS — Z9842 Cataract extraction status, left eye: Secondary | ICD-10-CM

## 2023-07-19 DIAGNOSIS — I2510 Atherosclerotic heart disease of native coronary artery without angina pectoris: Secondary | ICD-10-CM | POA: Diagnosis present

## 2023-07-19 DIAGNOSIS — I6522 Occlusion and stenosis of left carotid artery: Secondary | ICD-10-CM

## 2023-07-19 DIAGNOSIS — Z7902 Long term (current) use of antithrombotics/antiplatelets: Secondary | ICD-10-CM

## 2023-07-19 DIAGNOSIS — Z79899 Other long term (current) drug therapy: Secondary | ICD-10-CM

## 2023-07-19 DIAGNOSIS — I6523 Occlusion and stenosis of bilateral carotid arteries: Secondary | ICD-10-CM

## 2023-07-19 DIAGNOSIS — Z860101 Personal history of adenomatous and serrated colon polyps: Secondary | ICD-10-CM | POA: Diagnosis not present

## 2023-07-19 DIAGNOSIS — E1151 Type 2 diabetes mellitus with diabetic peripheral angiopathy without gangrene: Secondary | ICD-10-CM | POA: Diagnosis present

## 2023-07-19 DIAGNOSIS — Z95828 Presence of other vascular implants and grafts: Secondary | ICD-10-CM

## 2023-07-19 DIAGNOSIS — G4733 Obstructive sleep apnea (adult) (pediatric): Secondary | ICD-10-CM | POA: Diagnosis present

## 2023-07-19 DIAGNOSIS — Z888 Allergy status to other drugs, medicaments and biological substances status: Secondary | ICD-10-CM

## 2023-07-19 DIAGNOSIS — Z634 Disappearance and death of family member: Secondary | ICD-10-CM

## 2023-07-19 DIAGNOSIS — Z7984 Long term (current) use of oral hypoglycemic drugs: Secondary | ICD-10-CM

## 2023-07-19 DIAGNOSIS — Z961 Presence of intraocular lens: Secondary | ICD-10-CM | POA: Diagnosis present

## 2023-07-19 DIAGNOSIS — E785 Hyperlipidemia, unspecified: Secondary | ICD-10-CM | POA: Diagnosis present

## 2023-07-19 DIAGNOSIS — Z96652 Presence of left artificial knee joint: Secondary | ICD-10-CM | POA: Diagnosis present

## 2023-07-19 DIAGNOSIS — Z9889 Other specified postprocedural states: Secondary | ICD-10-CM

## 2023-07-19 DIAGNOSIS — I10 Essential (primary) hypertension: Secondary | ICD-10-CM | POA: Diagnosis present

## 2023-07-19 DIAGNOSIS — Z9841 Cataract extraction status, right eye: Secondary | ICD-10-CM | POA: Diagnosis not present

## 2023-07-19 DIAGNOSIS — Z8546 Personal history of malignant neoplasm of prostate: Secondary | ICD-10-CM

## 2023-07-19 DIAGNOSIS — E119 Type 2 diabetes mellitus without complications: Secondary | ICD-10-CM

## 2023-07-19 HISTORY — PX: TRANSCAROTID ARTERY REVASCULARIZATIONÂ: SHX6778

## 2023-07-19 HISTORY — PX: ULTRASOUND GUIDANCE FOR VASCULAR ACCESS: SHX6516

## 2023-07-19 LAB — GLUCOSE, CAPILLARY
Glucose-Capillary: 126 mg/dL — ABNORMAL HIGH (ref 70–99)
Glucose-Capillary: 170 mg/dL — ABNORMAL HIGH (ref 70–99)
Glucose-Capillary: 139 mg/dL — ABNORMAL HIGH (ref 70–99)
Glucose-Capillary: 180 mg/dL — ABNORMAL HIGH (ref 70–99)
Glucose-Capillary: 235 mg/dL — ABNORMAL HIGH (ref 70–99)

## 2023-07-19 SURGERY — TRANSCAROTID ARTERY REVASCULARIZATION (TCAR)
Anesthesia: General | Site: Neck | Laterality: Right

## 2023-07-19 MED ORDER — EPHEDRINE 5 MG/ML INJ
INTRAVENOUS | Status: AC
  Filled 2023-07-19: qty 5

## 2023-07-19 MED ORDER — GLYCOPYRROLATE 0.2 MG/ML IJ SOLN
INTRAMUSCULAR | Status: DC | PRN
  Administered 2023-07-19 (×2): .2 mg via INTRAVENOUS

## 2023-07-19 MED ORDER — DEXAMETHASONE SODIUM PHOSPHATE 10 MG/ML IJ SOLN
INTRAMUSCULAR | Status: DC | PRN
  Administered 2023-07-19: 10 mg via INTRAVENOUS

## 2023-07-19 MED ORDER — SODIUM CHLORIDE 0.9 % IV SOLN: INTRAVENOUS | Status: DC

## 2023-07-19 MED ORDER — FENTANYL CITRATE (PF) 100 MCG/2ML IJ SOLN
INTRAMUSCULAR | Status: AC
  Filled 2023-07-19: qty 2

## 2023-07-19 MED ORDER — FUSION PLUS PO CAPS: 1.0000 | ORAL_CAPSULE | Freq: Every day | ORAL | Status: DC

## 2023-07-19 MED ORDER — CHLORHEXIDINE GLUCONATE CLOTH 2 % EX PADS: 6.0000 | MEDICATED_PAD | Freq: Once | CUTANEOUS | Status: DC

## 2023-07-19 MED ORDER — CLOPIDOGREL BISULFATE 75 MG PO TABS
75.0000 mg | ORAL_TABLET | Freq: Every day | ORAL | Status: DC
  Administered 2023-07-20: 75 mg via ORAL
  Filled 2023-07-19: qty 1

## 2023-07-19 MED ORDER — METOPROLOL TARTRATE 5 MG/5ML IV SOLN: 2.0000 mg | INTRAVENOUS | Status: DC | PRN

## 2023-07-19 MED ORDER — FINASTERIDE 5 MG PO TABS
5.0000 mg | ORAL_TABLET | Freq: Every day | ORAL | Status: DC
  Administered 2023-07-19: 5 mg via ORAL
  Filled 2023-07-19: qty 1

## 2023-07-19 MED ORDER — HEPARIN 6000 UNIT IRRIGATION SOLUTION
Status: AC
  Filled 2023-07-19: qty 500

## 2023-07-19 MED ORDER — FERROUS SULFATE 325 (65 FE) MG PO TABS
325.0000 mg | ORAL_TABLET | Freq: Every day | ORAL | Status: DC
  Administered 2023-07-20: 325 mg via ORAL
  Filled 2023-07-19 (×2): qty 1

## 2023-07-19 MED ORDER — CEFAZOLIN SODIUM-DEXTROSE 2-4 GM/100ML-% IV SOLN
2.0000 g | INTRAVENOUS | Status: AC
  Administered 2023-07-19: 2 g via INTRAVENOUS
  Filled 2023-07-19: qty 100

## 2023-07-19 MED ORDER — ALBUTEROL SULFATE (2.5 MG/3ML) 0.083% IN NEBU: 3.0000 mL | INHALATION_SOLUTION | RESPIRATORY_TRACT | Status: DC | PRN

## 2023-07-19 MED ORDER — VASOPRESSIN 20 UNIT/ML IV SOLN
INTRAVENOUS | Status: DC | PRN
  Administered 2023-07-19: 2 [IU] via INTRAVENOUS
  Administered 2023-07-19: 1 [IU] via INTRAVENOUS
  Administered 2023-07-19: 2 [IU] via INTRAVENOUS
  Administered 2023-07-19 (×2): 5 [IU] via INTRAVENOUS

## 2023-07-19 MED ORDER — CHLORHEXIDINE GLUCONATE 0.12 % MT SOLN
15.0000 mL | Freq: Once | OROMUCOSAL | Status: AC
Start: 2023-07-19 — End: 2023-07-19
  Administered 2023-07-19: 15 mL via OROMUCOSAL
  Filled 2023-07-19: qty 15

## 2023-07-19 MED ORDER — LABETALOL HCL 100 MG PO TABS
100.0000 mg | ORAL_TABLET | Freq: Every day | ORAL | Status: DC
  Administered 2023-07-19: 100 mg via ORAL
  Filled 2023-07-19 (×3): qty 1

## 2023-07-19 MED ORDER — HEPARIN 6000 UNIT IRRIGATION SOLUTION
Status: DC | PRN
  Administered 2023-07-19: 1

## 2023-07-19 MED ORDER — HEPARIN SODIUM (PORCINE) 1000 UNIT/ML IJ SOLN
INTRAMUSCULAR | Status: DC | PRN
  Administered 2023-07-19: 9000 [IU] via INTRAVENOUS

## 2023-07-19 MED ORDER — LACTATED RINGERS IV SOLN: INTRAVENOUS | Status: DC

## 2023-07-19 MED ORDER — PANTOPRAZOLE SODIUM 40 MG PO TBEC
40.0000 mg | DELAYED_RELEASE_TABLET | Freq: Every day | ORAL | Status: DC
  Administered 2023-07-20: 40 mg via ORAL
  Filled 2023-07-19: qty 1

## 2023-07-19 MED ORDER — ROCURONIUM BROMIDE 10 MG/ML (PF) SYRINGE
PREFILLED_SYRINGE | INTRAVENOUS | Status: AC
  Filled 2023-07-19: qty 10

## 2023-07-19 MED ORDER — ALLOPURINOL 300 MG PO TABS
300.0000 mg | ORAL_TABLET | Freq: Every day | ORAL | Status: DC
  Administered 2023-07-19: 300 mg via ORAL
  Filled 2023-07-19: qty 1

## 2023-07-19 MED ORDER — OXYCODONE-ACETAMINOPHEN 5-325 MG PO TABS
1.0000 | ORAL_TABLET | ORAL | Status: DC | PRN
Start: 2023-07-19 — End: 2023-07-20
  Administered 2023-07-19: 2 via ORAL
  Administered 2023-07-19 – 2023-07-20 (×2): 1 via ORAL
  Filled 2023-07-19: qty 2
  Filled 2023-07-19 (×2): qty 1

## 2023-07-19 MED ORDER — PHENYLEPHRINE 80 MCG/ML (10ML) SYRINGE FOR IV PUSH (FOR BLOOD PRESSURE SUPPORT)
PREFILLED_SYRINGE | INTRAVENOUS | Status: AC
  Filled 2023-07-19: qty 10

## 2023-07-19 MED ORDER — GLYCOPYRROLATE PF 0.2 MG/ML IJ SOSY
PREFILLED_SYRINGE | INTRAMUSCULAR | Status: AC
  Filled 2023-07-19: qty 1

## 2023-07-19 MED ORDER — VASOPRESSIN 20 UNIT/ML IV SOLN
INTRAVENOUS | Status: AC
  Filled 2023-07-19: qty 1

## 2023-07-19 MED ORDER — OXYCODONE HCL 5 MG PO TABS: 5.0000 mg | ORAL_TABLET | Freq: Once | ORAL | Status: DC | PRN

## 2023-07-19 MED ORDER — DEXAMETHASONE SODIUM PHOSPHATE 10 MG/ML IJ SOLN
INTRAMUSCULAR | Status: AC
  Filled 2023-07-19: qty 1

## 2023-07-19 MED ORDER — ONDANSETRON HCL 4 MG/2ML IJ SOLN: 4.0000 mg | Freq: Four times a day (QID) | INTRAMUSCULAR | Status: DC | PRN

## 2023-07-19 MED ORDER — MAGNESIUM SULFATE 2 GM/50ML IV SOLN: 2.0000 g | Freq: Every day | INTRAVENOUS | Status: DC | PRN

## 2023-07-19 MED ORDER — DOCUSATE SODIUM 100 MG PO CAPS: 100.0000 mg | ORAL_CAPSULE | Freq: Every day | ORAL | Status: DC

## 2023-07-19 MED ORDER — ACETAMINOPHEN 325 MG RE SUPP: 325.0000 mg | RECTAL | Status: DC | PRN

## 2023-07-19 MED ORDER — ROSUVASTATIN CALCIUM 5 MG PO TABS
10.0000 mg | ORAL_TABLET | Freq: Every morning | ORAL | Status: DC
  Administered 2023-07-20: 10 mg via ORAL
  Filled 2023-07-19: qty 2

## 2023-07-19 MED ORDER — DOCUSATE SODIUM 100 MG PO CAPS
100.0000 mg | ORAL_CAPSULE | Freq: Every day | ORAL | Status: DC
  Administered 2023-07-19: 100 mg via ORAL
  Filled 2023-07-19: qty 1

## 2023-07-19 MED ORDER — LABETALOL HCL 5 MG/ML IV SOLN
10.0000 mg | INTRAVENOUS | Status: DC | PRN
Start: 2023-07-19 — End: 2023-07-20

## 2023-07-19 MED ORDER — CEFAZOLIN SODIUM-DEXTROSE 2-4 GM/100ML-% IV SOLN
2.0000 g | Freq: Three times a day (TID) | INTRAVENOUS | Status: AC
  Administered 2023-07-19 (×2): 2 g via INTRAVENOUS
  Filled 2023-07-19 (×2): qty 100

## 2023-07-19 MED ORDER — GUAIFENESIN-DM 100-10 MG/5ML PO SYRP
15.0000 mL | ORAL_SOLUTION | ORAL | Status: DC | PRN
Start: 2023-07-19 — End: 2023-07-20

## 2023-07-19 MED ORDER — LIDOCAINE 2% (20 MG/ML) 5 ML SYRINGE
INTRAMUSCULAR | Status: AC
  Filled 2023-07-19: qty 5

## 2023-07-19 MED ORDER — POTASSIUM CHLORIDE CRYS ER 20 MEQ PO TBCR: 20.0000 meq | EXTENDED_RELEASE_TABLET | Freq: Every day | ORAL | Status: DC | PRN

## 2023-07-19 MED ORDER — HEPARIN SODIUM (PORCINE) 1000 UNIT/ML IJ SOLN
INTRAMUSCULAR | Status: AC
  Filled 2023-07-19: qty 10

## 2023-07-19 MED ORDER — ONDANSETRON HCL 4 MG/2ML IJ SOLN
INTRAMUSCULAR | Status: DC | PRN
  Administered 2023-07-19 (×2): 4 mg via INTRAVENOUS

## 2023-07-19 MED ORDER — INSULIN ASPART 100 UNIT/ML IJ SOLN
0.0000 [IU] | Freq: Three times a day (TID) | INTRAMUSCULAR | Status: DC
Start: 2023-07-19 — End: 2023-07-20
  Administered 2023-07-19: 3 [IU] via SUBCUTANEOUS
  Administered 2023-07-20: 2 [IU] via SUBCUTANEOUS

## 2023-07-19 MED ORDER — OXYCODONE HCL 5 MG/5ML PO SOLN
5.0000 mg | Freq: Once | ORAL | Status: DC | PRN
Start: 2023-07-19 — End: 2023-07-19

## 2023-07-19 MED ORDER — NOREPINEPHRINE 4 MG/250ML-% IV SOLN
INTRAVENOUS | Status: AC
  Filled 2023-07-19: qty 250

## 2023-07-19 MED ORDER — LIDOCAINE 2% (20 MG/ML) 5 ML SYRINGE
INTRAMUSCULAR | Status: DC | PRN
  Administered 2023-07-19: 100 mg via INTRAVENOUS

## 2023-07-19 MED ORDER — NOREPINEPHRINE 4 MG/250ML-% IV SOLN
INTRAVENOUS | Status: DC | PRN
  Administered 2023-07-19: 10 ug/min via INTRAVENOUS

## 2023-07-19 MED ORDER — ALUM & MAG HYDROXIDE-SIMETH 200-200-20 MG/5ML PO SUSP: 15.0000 mL | ORAL | Status: DC | PRN

## 2023-07-19 MED ORDER — SUGAMMADEX SODIUM 200 MG/2ML IV SOLN
INTRAVENOUS | Status: DC | PRN
  Administered 2023-07-19 (×2): 200 mg via INTRAVENOUS

## 2023-07-19 MED ORDER — ACETAMINOPHEN 10 MG/ML IV SOLN: 1000.0000 mg | Freq: Once | INTRAVENOUS | Status: DC | PRN

## 2023-07-19 MED ORDER — MORPHINE SULFATE (PF) 2 MG/ML IV SOLN: 2.0000 mg | INTRAVENOUS | Status: DC | PRN

## 2023-07-19 MED ORDER — PROTAMINE SULFATE 10 MG/ML IV SOLN
INTRAVENOUS | Status: DC | PRN
  Administered 2023-07-19: 10 mg via INTRAVENOUS
  Administered 2023-07-19: 40 mg via INTRAVENOUS

## 2023-07-19 MED ORDER — CLEVIDIPINE BUTYRATE 0.5 MG/ML IV EMUL
INTRAVENOUS | Status: AC
  Filled 2023-07-19: qty 50

## 2023-07-19 MED ORDER — DROPERIDOL 2.5 MG/ML IJ SOLN: 0.6250 mg | Freq: Once | INTRAMUSCULAR | Status: DC | PRN

## 2023-07-19 MED ORDER — BISACODYL 10 MG RE SUPP: 10.0000 mg | Freq: Every day | RECTAL | Status: DC | PRN

## 2023-07-19 MED ORDER — PROPOFOL 10 MG/ML IV BOLUS
INTRAVENOUS | Status: DC | PRN
  Administered 2023-07-19: 100 mg via INTRAVENOUS

## 2023-07-19 MED ORDER — HEMOSTATIC AGENTS (NO CHARGE) OPTIME
TOPICAL | Status: DC | PRN
  Administered 2023-07-19: 1 via TOPICAL

## 2023-07-19 MED ORDER — EPHEDRINE SULFATE-NACL 50-0.9 MG/10ML-% IV SOSY
PREFILLED_SYRINGE | INTRAVENOUS | Status: DC | PRN
  Administered 2023-07-19: 10 mg via INTRAVENOUS
  Administered 2023-07-19: 5 mg via INTRAVENOUS
  Administered 2023-07-19: 15 mg via INTRAVENOUS
  Administered 2023-07-19: 1 mg via INTRAVENOUS

## 2023-07-19 MED ORDER — POLYETHYLENE GLYCOL 3350 17 G PO PACK
17.0000 g | PACK | Freq: Every day | ORAL | Status: DC | PRN
Start: 2023-07-19 — End: 2023-07-20

## 2023-07-19 MED ORDER — FENTANYL CITRATE (PF) 250 MCG/5ML IJ SOLN
INTRAMUSCULAR | Status: AC
  Filled 2023-07-19: qty 5

## 2023-07-19 MED ORDER — ROCURONIUM BROMIDE 10 MG/ML (PF) SYRINGE
PREFILLED_SYRINGE | INTRAVENOUS | Status: DC | PRN
  Administered 2023-07-19: 30 mg via INTRAVENOUS
  Administered 2023-07-19: 70 mg via INTRAVENOUS

## 2023-07-19 MED ORDER — FENTANYL CITRATE (PF) 250 MCG/5ML IJ SOLN
INTRAMUSCULAR | Status: DC | PRN
  Administered 2023-07-19: 150 ug via INTRAVENOUS

## 2023-07-19 MED ORDER — ACETAMINOPHEN 325 MG PO TABS: 325.0000 mg | ORAL_TABLET | ORAL | Status: DC | PRN

## 2023-07-19 MED ORDER — INSULIN ASPART 100 UNIT/ML IJ SOLN: 0.0000 [IU] | INTRAMUSCULAR | Status: DC | PRN

## 2023-07-19 MED ORDER — AMLODIPINE BESYLATE 10 MG PO TABS
10.0000 mg | ORAL_TABLET | Freq: Every day | ORAL | Status: DC
  Administered 2023-07-20: 10 mg via ORAL
  Filled 2023-07-19: qty 1

## 2023-07-19 MED ORDER — ONDANSETRON HCL 4 MG/2ML IJ SOLN
INTRAMUSCULAR | Status: AC
  Filled 2023-07-19: qty 2

## 2023-07-19 MED ORDER — PHENYLEPHRINE 80 MCG/ML (10ML) SYRINGE FOR IV PUSH (FOR BLOOD PRESSURE SUPPORT)
PREFILLED_SYRINGE | INTRAVENOUS | Status: DC | PRN
  Administered 2023-07-19 (×2): 160 ug via INTRAVENOUS
  Administered 2023-07-19: 80 ug via INTRAVENOUS

## 2023-07-19 MED ORDER — TAMSULOSIN HCL 0.4 MG PO CAPS
0.4000 mg | ORAL_CAPSULE | Freq: Every day | ORAL | Status: DC
  Administered 2023-07-20: 0.4 mg via ORAL
  Filled 2023-07-19: qty 1

## 2023-07-19 MED ORDER — PHENYLEPHRINE HCL-NACL 20-0.9 MG/250ML-% IV SOLN
INTRAVENOUS | Status: DC | PRN
  Administered 2023-07-19: 30 ug/min via INTRAVENOUS

## 2023-07-19 MED ORDER — ALBUMIN HUMAN 5 % IV SOLN: INTRAVENOUS | Status: DC | PRN

## 2023-07-19 MED ORDER — HYDRALAZINE HCL 20 MG/ML IJ SOLN
5.0000 mg | INTRAMUSCULAR | Status: DC | PRN
Start: 2023-07-19 — End: 2023-07-20

## 2023-07-19 MED ORDER — GABAPENTIN 300 MG PO CAPS
300.0000 mg | ORAL_CAPSULE | Freq: Two times a day (BID) | ORAL | Status: DC
  Administered 2023-07-19 – 2023-07-20 (×2): 300 mg via ORAL
  Filled 2023-07-19 (×2): qty 1

## 2023-07-19 MED ORDER — PROPOFOL 10 MG/ML IV BOLUS
INTRAVENOUS | Status: AC
  Filled 2023-07-19: qty 20

## 2023-07-19 MED ORDER — 0.9 % SODIUM CHLORIDE (POUR BTL) OPTIME
TOPICAL | Status: DC | PRN
  Administered 2023-07-19: 1000 mL

## 2023-07-19 MED ORDER — ORAL CARE MOUTH RINSE: 15.0000 mL | Freq: Once | OROMUCOSAL | Status: AC

## 2023-07-19 MED ORDER — PHENOL 1.4 % MT LIQD: 1.0000 | OROMUCOSAL | Status: DC | PRN

## 2023-07-19 MED ORDER — IODIXANOL 320 MG/ML IV SOLN
INTRAVENOUS | Status: DC | PRN
  Administered 2023-07-19: 12 mL via INTRA_ARTERIAL

## 2023-07-19 MED ORDER — FUROSEMIDE 40 MG PO TABS
40.0000 mg | ORAL_TABLET | Freq: Every morning | ORAL | Status: DC
  Administered 2023-07-20: 40 mg via ORAL
  Filled 2023-07-19: qty 1

## 2023-07-19 MED ORDER — ASPIRIN 81 MG PO TBEC
81.0000 mg | DELAYED_RELEASE_TABLET | Freq: Every day | ORAL | Status: DC
  Administered 2023-07-20: 81 mg via ORAL
  Filled 2023-07-19: qty 1

## 2023-07-19 MED ORDER — PROTAMINE SULFATE 10 MG/ML IV SOLN
INTRAVENOUS | Status: AC
  Filled 2023-07-19: qty 5

## 2023-07-19 MED ORDER — SODIUM CHLORIDE 0.9 % IV SOLN: 500.0000 mL | Freq: Once | INTRAVENOUS | Status: DC | PRN

## 2023-07-19 MED ORDER — FENTANYL CITRATE (PF) 100 MCG/2ML IJ SOLN
25.0000 ug | INTRAMUSCULAR | Status: DC | PRN
  Administered 2023-07-19: 25 ug via INTRAVENOUS

## 2023-07-19 SURGICAL SUPPLY — 48 items
ADH SKN CLS APL DERMABOND .7 (GAUZE/BANDAGES/DRESSINGS) ×4
BAG BANDED W/RUBBER/TAPE 36X54 (MISCELLANEOUS) ×2 IMPLANT
BAG COUNTER SPONGE SURGICOUNT (BAG) ×2 IMPLANT
BAG EQP BAND 135X91 W/RBR TAPE (MISCELLANEOUS) ×2
BAG SPNG CNTER NS LX DISP (BAG) ×2
CANISTER SUCT 3000ML PPV (MISCELLANEOUS) ×2 IMPLANT
CATH BALLN ENROUTE 6X35 (CATHETERS) IMPLANT
CATH BEACON 5 .035 40 KMP TP (CATHETERS) IMPLANT
CATH ROBINSON RED A/P 18FR (CATHETERS) IMPLANT
COVER DOME SNAP 22 D (MISCELLANEOUS) ×2 IMPLANT
COVER PROBE W GEL 5X96 (DRAPES) ×2 IMPLANT
COVER SURGIBOOT TRANSDUCER 6 (DISPOSABLE) IMPLANT
DERMABOND ADVANCED .7 DNX12 (GAUZE/BANDAGES/DRESSINGS) ×2 IMPLANT
DRAPE FEMORAL ANGIO 80X135IN (DRAPES) ×2 IMPLANT
ELECT REM PT RETURN 9FT ADLT (ELECTROSURGICAL) ×2
ELECTRODE REM PT RTRN 9FT ADLT (ELECTROSURGICAL) ×2 IMPLANT
GLOVE BIO SURGEON STRL SZ7.5 (GLOVE) ×2 IMPLANT
GOWN STRL REUS W/ TWL LRG LVL3 (GOWN DISPOSABLE) ×4 IMPLANT
GOWN STRL REUS W/ TWL XL LVL3 (GOWN DISPOSABLE) ×2 IMPLANT
GOWN STRL REUS W/TWL LRG LVL3 (GOWN DISPOSABLE) ×4
GOWN STRL REUS W/TWL XL LVL3 (GOWN DISPOSABLE) ×2
GUIDEWIRE ENROUTE 0.014 (WIRE) ×2 IMPLANT
KIT BASIN OR (CUSTOM PROCEDURE TRAY) ×2 IMPLANT
KIT ENCORE 26 ADVANTAGE (KITS) ×2 IMPLANT
KIT INTRODUCER GALT 7 (INTRODUCER) ×2 IMPLANT
KIT TURNOVER KIT B (KITS) ×2 IMPLANT
NDL HYPO 25GX1X1/2 BEV (NEEDLE) IMPLANT
NEEDLE HYPO 25GX1X1/2 BEV (NEEDLE) IMPLANT
PACK CAROTID (CUSTOM PROCEDURE TRAY) ×2 IMPLANT
POSITIONER HEAD DONUT 9IN (MISCELLANEOUS) ×2 IMPLANT
POWDER SURGICEL 3.0 GRAM (HEMOSTASIS) IMPLANT
SET MICROPUNCTURE 5F STIFF (MISCELLANEOUS) ×2 IMPLANT
STENT TRANSCAROTID SYS 10X40 (Permanent Stent) IMPLANT
SUT MNCRL AB 4-0 PS2 18 (SUTURE) ×2 IMPLANT
SUT PROLENE 5 0 C 1 24 (SUTURE) ×2 IMPLANT
SUT SILK 2 0 PERMA HAND 18 BK (SUTURE) ×2 IMPLANT
SUT SILK 3 0 (SUTURE)
SUT SILK 3-0 18XBRD TIE 12 (SUTURE) IMPLANT
SUT VIC AB 3-0 SH 27 (SUTURE) ×2
SUT VIC AB 3-0 SH 27X BRD (SUTURE) ×2 IMPLANT
SYR 10ML LL (SYRINGE) ×6 IMPLANT
SYR 20ML LL LF (SYRINGE) ×2 IMPLANT
SYR CONTROL 10ML LL (SYRINGE) IMPLANT
SYSTEM TRANSCAROTID NEUROPRTCT (MISCELLANEOUS) ×2 IMPLANT
TOWEL GREEN STERILE (TOWEL DISPOSABLE) ×2 IMPLANT
TRANSCAROTID NEUROPROTECT SYS (MISCELLANEOUS) ×2
WATER STERILE IRR 1000ML POUR (IV SOLUTION) ×2 IMPLANT
WIRE BENTSON .035X145CM (WIRE) ×2 IMPLANT

## 2023-07-19 NOTE — Transfer of Care (Signed)
Immediate Anesthesia Transfer of Care Note  Patient: [name]  Procedure(s) Performed: TRANSCAROTID ARTERY REVASCULARIZATION (Left: Neck)  Patient Location: PACU  Anesthesia Type:General  Level of Consciousness: awake, alert , and oriented  Airway & Oxygen Therapy: Patient Spontanous Breathing  Post-op Assessment: Report given to RN and Post -op Vital signs reviewed and stable  Post vital signs: Reviewed and stable  Last Vitals:  Vitals Value Taken Time  BP 112/53 07/19/23 1015  Temp    Pulse 88 07/19/23 1016  Resp 20 07/19/23 1016  SpO2 90 % 07/19/23 1016  Vitals shown include unfiled device data.  Last Pain:  Vitals:   07/19/23 1000  TempSrc:   PainSc: 0-No pain         Complications: No notable events documented.

## 2023-07-19 NOTE — Discharge Instructions (Signed)
   [facility]  Discharge Instructions   Carotid Surgery  Please refer to the following instructions for your post-procedure care. Your surgeon or physician assistant will discuss any changes with you.  Activity  You are encouraged to walk as much as you can. You can slowly return to normal activities but must avoid strenuous activity and heavy lifting until your doctor tell you it's okay. Avoid activities such as vacuuming or swinging a golf club. You can drive after one week if you are comfortable and you are no longer taking prescription pain medications. It is normal to feel tired for serval weeks after your surgery. It is also normal to have difficulty with sleep habits, eating, and bowel movements after surgery. These will go away with time.  Bathing/Showering  Shower daily after you go home. Do not soak in a bathtub, hot tub, or swim until the incision heals completely.  Incision Care  Shower every day. Clean your incision with mild soap and water. Pat the area dry with a clean towel. You do not need a bandage unless otherwise instructed. Do not apply any ointments or creams to your incision. You may have skin glue on your incision. Do not peel it off. It will come off on its own in about one week. Your incision may feel thickened and raised for several weeks after your surgery. This is normal and the skin will soften over time.   For Men Only: It's okay to shave around the incision but do not shave the incision itself for 2 weeks. It is common to have numbness under your chin that could last for several months.  Diet  Resume your normal diet. There are no special food restrictions following this procedure. A low fat/low cholesterol diet is recommended for all patients with vascular disease. In order to heal from your surgery, it is CRITICAL to get adequate nutrition. Your body requires vitamins, minerals, and protein. Vegetables are the best source of  vitamins and minerals. Vegetables also provide the perfect balance of protein. Processed food has little nutritional value, so try to avoid this.  Medications  Resume taking all of your medications unless your doctor or physician assistant tells you not to. If your incision is causing pain, you may take over-the- counter pain relievers such as acetaminophen (Tylenol). If you were prescribed a stronger pain medication, please be aware these medications can cause nausea and constipation. Prevent nausea by taking the medication with a snack or meal. Avoid constipation by drinking plenty of fluids and eating foods with a high amount of fiber, such as fruits, vegetables, and grains.   Do not take Tylenol if you are taking prescription pain medications.  Follow Up  Our office will schedule a follow up appointment 2-3 weeks following discharge.  Please call us immediately for any of the following conditions  . Increased pain, redness, drainage (pus) from your incision site. . Fever of 101 degrees or higher. . If you should develop stroke (slurred speech, difficulty swallowing, weakness on one side of your body, loss of vision) you should call 911 and go to the nearest emergency room. .  Reduce your risk of vascular disease:  . Stop smoking. If you would like help call [facility] at 1-800-QUIT-NOW (1-800-784-8669) or [facility] at 336-586-4000. . Manage your cholesterol . Maintain a desired weight . Control your diabetes . Keep your blood pressure down .  If you have any questions, please call the office at [phone]. 

## 2023-07-19 NOTE — Plan of Care (Signed)
  Problem: Education: Goal: Knowledge of discharge needs will improve Outcome: Progressing   Problem: Clinical Measurements: Goal: Postoperative complications will be avoided or minimized Outcome: Progressing   Problem: Respiratory: Goal: Ability to achieve and maintain a regular respiratory rate will improve Outcome: Progressing   Problem: Skin Integrity: Goal: Demonstration of wound healing without infection will improve Outcome: Progressing   Problem: Education: Goal: Ability to describe self-care measures that may prevent or decrease complications (Diabetes Survival Skills Education) will improve Outcome: Progressing Goal: Individualized Educational Video(s) Outcome: Progressing   Problem: Coping: Goal: Ability to adjust to condition or change in health will improve Outcome: Progressing   Problem: Fluid Volume: Goal: Ability to maintain a balanced intake and output will improve Outcome: Progressing   Problem: Health Behavior/Discharge Planning: Goal: Ability to identify and utilize available resources and services will improve Outcome: Progressing Goal: Ability to manage health-related needs will improve Outcome: Progressing   Problem: Metabolic: Goal: Ability to maintain appropriate glucose levels will improve Outcome: Progressing   Problem: Nutritional: Goal: Maintenance of adequate nutrition will improve Outcome: Progressing Goal: Progress toward achieving an optimal weight will improve Outcome: Progressing   Problem: Skin Integrity: Goal: Risk for impaired skin integrity will decrease Outcome: Progressing   Problem: Tissue Perfusion: Goal: Adequacy of tissue perfusion will improve Outcome: Progressing

## 2023-07-19 NOTE — Progress Notes (Signed)
  Day of Surgery Note    Subjective:  says his mouth is dry   Vitals:   07/19/23 1200 07/19/23 1230  BP: (!) 140/69 135/66  Pulse: 78 72  Resp: 17 15  Temp: 97.8 F (36.6 C)   SpO2: 94% 94%    Incisions:   left neck incision is clean without hematoma; right groin soft without hematoma Neuro:  moving all extremities equally; tongue is midline Cardiac:  regular Lungs:  non labored    Assessment/Plan:  This is a 77 y.o. male who is s/p  Left TCAR  -pt doing well in recovery. -continue asa/statin/plavix -to 4 [place] later today -anticipate discharge home tomorrow if no events overnight.    Doreatha Massed, PA-C 07/19/2023 12:41 PM [phone]

## 2023-07-19 NOTE — Anesthesia Procedure Notes (Signed)
Procedure Name: Intubation Date/Time: 07/19/2023 7:52 AM  Performed by: Camillia Herter, CRNAPre-anesthesia Checklist: Patient identified, Emergency Drugs available, Suction available and Patient being monitored Patient Re-evaluated:Patient Re-evaluated prior to induction Oxygen Delivery Method: Circle System Utilized Preoxygenation: Pre-oxygenation with 100% oxygen Induction Type: IV induction Ventilation: Mask ventilation without difficulty Laryngoscope Size: Miller and 3 Grade View: Grade I Tube type: Oral Tube size: 7.5 mm Number of attempts: 1 Airway Equipment and Method: Stylet and Oral airway Placement Confirmation: ETT inserted through vocal cords under direct vision, positive ETCO2 and breath sounds checked- equal and bilateral Secured at: 23 cm Tube secured with: Tape Dental Injury: Teeth and Oropharynx as per pre-operative assessment

## 2023-07-19 NOTE — Interval H&P Note (Signed)
History and Physical Interval Note:  07/19/2023 7:15 AM  [name]  has presented today for surgery, with the diagnosis of Asymptomatic left carotid artery stenosis.  The various methods of treatment have been discussed with the patient and family. After consideration of risks, benefits and other options for treatment, the patient has consented to  Procedure(s): TRANSCAROTID ARTERY REVASCULARIZATION (Left) as a surgical intervention.  The patient's history has been reviewed, patient examined, no change in status, stable for surgery.  I have reviewed the patient's chart and labs.  Questions were answered to the patient's satisfaction.     Lemar Livings

## 2023-07-19 NOTE — Anesthesia Postprocedure Evaluation (Signed)
Anesthesia Post Note  Patient: [name]  Procedure(s) Performed: LEFT TRANSCAROTID ARTERY REVASCULARIZATION (Left: Neck) ULTRASOUND GUIDANCE FOR VASCULAR ACCESS, RIGHT FEMORAL VEIN (Right: Groin)     Patient location during evaluation: PACU Anesthesia Type: General Level of consciousness: awake and alert Pain management: pain level controlled Vital Signs Assessment: post-procedure vital signs reviewed and stable Respiratory status: spontaneous breathing, nonlabored ventilation, respiratory function stable and patient connected to nasal cannula oxygen Cardiovascular status: blood pressure returned to baseline and stable Postop Assessment: no apparent nausea or vomiting Anesthetic complications: no   No notable events documented.  Last Vitals:  Vitals:   07/19/23 1200 07/19/23 1230  BP: (!) 140/69 135/66  Pulse: 78 72  Resp: 17 15  Temp: 36.6 C   SpO2: 94% 94%    Last Pain:  Vitals:   07/19/23 1200  TempSrc:   PainSc: Asleep                 [place]

## 2023-07-19 NOTE — Op Note (Signed)
Patient name: [name] MRN: [id number] DOB: 1946/06/13 Sex: male  07/19/2023 Pre-operative Diagnosis: Asymptomatic left ICA stenosis Post-operative diagnosis:  Same Surgeon:  Apolinar Junes C. Randie Heinz, MD Assistant: Gillis Santa, MD Procedure Performed: 1.  Ultrasound-guided cannulation right common femoral vein for placement of flow reversal sheath 2.  Left transcarotid artery stenting with 10 x 40 mm EnRoute stent with flow reversal neuroprotection  Indications: 77 year old male with a history of bilateral high-grade asymptomatic carotid stenosis.  He has undergone transcarotid artery stenting on the right and now is indicated for left-sided stenting.  He remains on aspirin, Plavix and a statin.  Experience assistant was necessary to facilitate exposure of the common carotid artery as well as placement of sheath and passing wires and catheters.  Findings: Common carotid artery did have some areas of calcification.  After placement of sheath and initial angiography the external carotid artery did not briskly fill as it previously had with micropuncture sheath placement.  It appeared by wire manipulation that we were in a dissection plane we were ultimately able to get into the distal ICA and stent back to the distal common carotid artery and there was brisk flow through this without any flow into the external carotid artery.  We attempted in multiple views to identify dissection plane but could not.  Ultimately we removed the sheath and ultrasound identified may be a small posterior dissection flap but this was laid down there was certainly no flow limitation and brisk flow by Doppler.  Patient was neurologically intact upon awakening from anesthesia.   Procedure:  The patient was identified in the holding area and taken to the operating room where he was placed supine operative table and general anesthesia was induced.  He was sterilely prepped and draped in the left neck and chest in the usual  fashion, antibiotics were administered a timeout was called.  Ultrasound was used to identify the left common carotid artery and we did open a previous neck exposure incision which was slightly medial to the desired position but did not want to make a parallel incision.  I dissected down to the skin subcutaneous tissue and dense scar tissue identified the 2 heads of the sternocleidomastoid split these identified the avascular plane was able to mobilize the jugular vein laterally protected the vagus nerve and encircled the common carotid artery with vessel loop proximally as well as umbilical tape.  This time the patient was fully heparinized and ACT returned greater than 230.  Concomitantly the ultrasound was used to identify the right common femoral vein which was cannulated with a micropuncture needle followed by wire and a sheath followed by a Bentson wire and an 8 French sheath for flow reversal and this was flushed with heparinized saline.  After identified the common carotid artery patient was heparinized we then placed a 5-0 Prolene U-stitch and then cannulated with a micropuncture needle and a wire to 3 cm followed by a sheath of 3 cm.  Angiography was performed which demonstrated the external and internal carotid arteries and we placed a J-wire to the carotid bifurcation followed by the sheath which was hooked to flow reversal and flow reversal was confirmed.  After heparin circulated ACT was greater than 230 we performed a TCAR timeout with systolic blood pressure greater than 160.  We then clamped the common carotid artery proximally by cinching our vessel loop. Flow reversal was again confirmed.  We then used a wire which appeared to be in a dissection plane but  ultimately this flipped cephalad into the ICA distally.  We then ballooned the stenosed area of the ICA with a 6 x 35 mm balloon followed by primary stenting with a 10 x 40 mm stent.  Completion in 2 views after 2 minutes washout time  demonstrated brisk flow through the stent as well as no definitive dissection proximally.  Satisfied with this the wire was removed and the clamp was removed as well.  Flow reversal was discontinued.  The sheath was removed and the 5-0 Prolene suture was cinched.  There was brisk flow by Doppler.  We evaluated with ultrasound there was possibly a very small dissection flap posteriorly but this appeared to be laid down there was no evidence of flow limitation.  As such we administered 50 mg of protamine and thoroughly irrigated the wounds.  The right femoral sheath was removed and pressure was held until hemostasis was obtained.  The platysma was closed followed by the skin with 4-0 Monocryl and Dermabond was placed above this.  The patient was awakened from anesthesia having tolerated the procedure well without immediate complication and was noted to be neurologically intact and transferred to the recovery area.  All counts were correct at completion.  Contrast: 12 cc  EBL: 50 cc    Savhanna Sliva C. Randie Heinz, MD [facility] [place] Office: [phone] Pager: [phone]

## 2023-07-20 LAB — BASIC METABOLIC PANEL
Anion gap: 8 (ref 5–15)
BUN: 24 mg/dL — ABNORMAL HIGH (ref 8–23)
CO2: 25 mmol/L (ref 22–32)
Calcium: 9 mg/dL (ref 8.9–10.3)
Chloride: 105 mmol/L (ref 98–111)
Creatinine, Ser: 1.24 mg/dL (ref 0.61–1.24)
GFR, Estimated: 60 mL/min — ABNORMAL LOW (ref >60)
Glucose, Bld: 129 mg/dL — ABNORMAL HIGH (ref 70–99)
Potassium: 3.9 mmol/L (ref 3.5–5.1)
Sodium: 138 mmol/L (ref 135–145)

## 2023-07-20 LAB — CBC
HCT: 29 % — ABNORMAL LOW (ref 39.0–52.0)
Hemoglobin: 9.6 g/dL — ABNORMAL LOW (ref 13.0–17.0)
MCH: 31.9 pg (ref 26.0–34.0)
MCHC: 33.1 g/dL (ref 30.0–36.0)
MCV: 96.3 fL (ref 80.0–100.0)
Platelets: 175 10*3/uL (ref 150–400)
RBC: 3.01 MIL/uL — ABNORMAL LOW (ref 4.22–5.81)
RDW: 14.5 % (ref 11.5–15.5)
WBC: 16.6 10*3/uL — ABNORMAL HIGH (ref 4.0–10.5)
nRBC: 0 % (ref 0.0–0.2)

## 2023-07-20 LAB — LIPID PANEL
Cholesterol: 85 mg/dL (ref 0–200)
HDL: 37 mg/dL — ABNORMAL LOW (ref >40)
LDL Cholesterol: 31 mg/dL (ref 0–99)
Total CHOL/HDL Ratio: 2.3 {ratio}
Triglycerides: 85 mg/dL (ref <150)
VLDL: 17 mg/dL (ref 0–40)

## 2023-07-20 LAB — GLUCOSE, CAPILLARY
Glucose-Capillary: 134 mg/dL — ABNORMAL HIGH (ref 70–99)
Glucose-Capillary: 126 mg/dL — ABNORMAL HIGH (ref 70–99)

## 2023-07-20 LAB — POCT ACTIVATED CLOTTING TIME: Activated Clotting Time: 232 s

## 2023-07-20 MED ORDER — OXYCODONE-ACETAMINOPHEN 5-325 MG PO TABS: 1.0000 | ORAL_TABLET | Freq: Four times a day (QID) | ORAL | 0 refills | Status: DC | PRN

## 2023-07-20 MED ORDER — METFORMIN HCL ER 500 MG PO TB24: 500.0000 mg | ORAL_TABLET | Freq: Two times a day (BID) | ORAL | Status: DC

## 2023-07-20 NOTE — Discharge Summary (Signed)
Discharge Summary     [name] May 20, 1946 77 y.o. male  [id number]  Admission Date: 07/19/2023  Discharge Date: 07/20/2023  Physician: Juventino Slovak*  Admission Diagnosis: Carotid stenosis, left [I65.22] Asymptomatic carotid artery stenosis without infarction, left [I65.22]   HPI:   This is a 77 y.o. male  with history of right transcarotid artery stenting now with high-grade left ICA stenosis that remains asymptomatic.  His wife recently passed away after our last visit.  Patient continues on aspirin Plavix and statin.   Hospital Course:  The patient was admitted to the hospital and taken to the operating room on 07/19/2023 and underwent left TCAR    Findings: Common carotid artery did have some areas of calcification. After placement of sheath and initial angiography the external carotid artery did not briskly fill as it previously had with micropuncture sheath placement. It appeared by wire manipulation that we were in a dissection plane we were ultimately able to get into the distal ICA and stent back to the distal common carotid artery and there was brisk flow through this without any flow into the external carotid artery. We attempted in multiple views to identify dissection plane but could not. Ultimately we removed the sheath and ultrasound identified may be a small posterior dissection flap but this was laid down there was certainly no flow limitation and brisk flow by Doppler. Patient was neurologically intact upon awakening from anesthesia.   The pt tolerated the procedure well and was transported to the PACU in good condition.   By POD 1, the pt neuro status was in tact.  He was swallowing without difficulty, voiding and ambulating.  He is discharged home.    Recent Labs    07/20/23 0425  NA 138  K 3.9  CL 105  CO2 25  GLUCOSE 129*  BUN 24*  CALCIUM 9.0   Recent Labs    07/20/23 0425  WBC 16.6*  HGB 9.6*  HCT 29.0*  PLT 175   No  results for input(s): "INR" in the last 72 hours.   Discharge Instructions     Discharge patient   Complete by: As directed    Discharge after he has walked in hallways and been seen by Dr. Myra Gianotti   Discharge disposition: 01-Home or Self Care   Discharge patient date: 07/20/2023       Discharge Diagnosis:  Carotid stenosis, left [I65.22] Asymptomatic carotid artery stenosis without infarction, left [I65.22]  Secondary Diagnosis: Patient Active Problem List   Diagnosis Date Noted   Carotid stenosis, left 07/19/2023   Asymptomatic carotid artery stenosis without infarction, left 07/19/2023   B12 deficiency 07/16/2023   IDA (iron deficiency anemia) 12/26/2022   Helicobacter pylori gastritis 12/26/2022   GI bleed 09/20/2022   Dysphagia 09/20/2022   ABLA (acute blood loss anemia) 09/20/2022   Symptomatic anemia 09/19/2022   Weight loss 06/07/2022   Carotid stenosis 05/11/2022   Carotid artery stenosis 05/11/2022   S/P left TKA 02/25/2018   S/P total knee replacement 02/25/2018   Fever 10/09/2016   Community acquired pneumonia of left lower lobe of lung 10/09/2016   Gram negative sepsis (HCC) 02/05/2014   UTI (urinary tract infection) 02/04/2014   DM type 2 (diabetes mellitus, type 2) (HCC) 02/04/2014   Tobacco abuse 02/04/2014   Preop cardiovascular exam 08/05/2013   CAD (coronary artery disease) 08/05/2012   Degenerative lumbar spinal stenosis 04/03/2012   Chest pain 04/03/2012   Anemia 04/03/2012   Smoking 06/13/2011   JOINT  EFFUSION, KNEE 01/11/2010   Arthropathy, lower leg 12/12/2009   PAIN IN JOINT, LOWER LEG 12/12/2009   PROSTATE CANCER 11/08/2009   Chronic gouty arthropathy 11/08/2009   Overweight 11/08/2009   HYPERTENSION, BENIGN 11/08/2009   Carotid artery disease without cerebral infarction (HCC) 11/08/2009   Malignant neoplasm of bladder (HCC) 03/22/2008   Hyperlipidemia 03/22/2008   PAD (peripheral artery disease) (HCC) 03/22/2008   CONSTIPATION,  CHRONIC 03/22/2008   Osteoarthritis 03/22/2008   OSA (obstructive sleep apnea) 03/22/2008   PROSTATE CANCER, HX OF 03/22/2008   DIVERTICULITIS, HX OF 03/22/2008   Past Medical History:  Diagnosis Date   Anemia    Iron Deficiency   Bilateral renal cysts    Bladder cancer (HCC) UROLOGIST-  DR [facility]   RECURRENT 11/ 2017 --  hx turbt in 2004 and 08-19-2013   BPH without obstruction/lower urinary tract symptoms    Cervical fusion syndrome    LEFT ARM NUMBNESS--  CONTROLLED W/ GABAPENTIN   Complication of anesthesia    Coronary artery disease    CARDIOLOGIST-- DR Wyline Mood Lorelle Formosa)   DDD (degenerative disc disease), cervical    DDD (degenerative disc disease), lumbar    Diverticulosis of sigmoid colon    Heart murmur    slight murmur per patient   History of acute gouty arthritis    History of adenomatous polyp of colon    2001;  2012- non-malignant leiomyoma;  04-07-2014  tubualr adenoma and hyperplastic polyp's   History of prostate cancer UROLOGIST-  DR [facility]   S/P  RADIOACTIVE SEED IMPLANTS 2004   HOH (hard of hearing)    no hearing aids   Hyperlipidemia    Hypertension    OA (osteoarthritis)    Occlusion and stenosis of carotid artery without mention of cerebral infarction CARDIOLOGIST  -- DR  Christiane Ha BRANCH   LAST DUPLEX  11-23-2015  RICA  (DUPLEX DOPPLER 01-20-2016 RICA 60-79% PROXIMAL) /   LICA <50% (HAD CONSULT W/ DR Imogene Burn , VASCULAR SURGEON 01-20-2016)   OSA on CPAP    BiPAP   Peripheral vascular disease (HCC)    Carotid Artery Stenosis   PONV (postoperative nausea and vomiting)    Prostate cancer (HCC)    per patient "years ago"- had radiation implant   Spondylosis, cervical    Type 2 diabetes mellitus (HCC)    Wears glasses     Allergies as of 07/20/2023       Reactions   Dilaudid [hydromorphone Hcl] Other (See Comments)   "acts a little off"   Lyrica [pregabalin] Nausea Only, Other (See Comments)   Make me feel "bad"   Toradol [ketorolac  Tromethamine] Other (See Comments)   CauseD  hallucination ?-states not sure/ avoids per his MD   Tramadol    Couldn't sleep        Medication List     TAKE these medications    acetaminophen 500 MG tablet Commonly known as: TYLENOL Take 1,000 mg by mouth every 6 (six) hours as needed for moderate pain.   albuterol 108 (90 Base) MCG/ACT inhaler Commonly known as: VENTOLIN HFA Inhale 2 puffs into the lungs every 4 (four) hours as needed for shortness of breath or wheezing.   allopurinol 300 MG tablet Commonly known as: ZYLOPRIM Take 300 mg by mouth at bedtime.   amLODipine 10 MG tablet Commonly known as: NORVASC TAKE 1 TABLET BY MOUTH EVERY DAY   aspirin EC 81 MG tablet Take 81 mg by mouth at bedtime.   celecoxib 200  MG capsule Commonly known as: CELEBREX Take 200 mg by mouth 2 (two) times daily.   clopidogrel 75 MG tablet Commonly known as: PLAVIX Take 75 mg by mouth daily.   colchicine 0.6 MG tablet Take 0.6 mg by mouth 2 (two) times daily as needed (gout).   cyanocobalamin 1000 MCG tablet Commonly known as: VITAMIN B12 Take 1 tablet (1,000 mcg total) by mouth daily.   docusate sodium 100 MG capsule Commonly known as: COLACE Take 100 mg by mouth at bedtime.   ferrous sulfate 325 (65 FE) MG EC tablet Take 325 mg by mouth daily.   finasteride 5 MG tablet Commonly known as: PROSCAR Take 5 mg by mouth at bedtime.   furosemide 40 MG tablet Commonly known as: LASIX Take 40 mg by mouth every morning.   Fusion Plus Caps Take 1 capsule by mouth daily.   gabapentin 300 MG capsule Commonly known as: NEURONTIN Take 300 mg by mouth 2 (two) times daily.   HYDROcodone bit-homatropine 5-1.5 MG/5ML syrup Commonly known as: HYCODAN Take 5 mLs by mouth every 6 (six) hours as needed for cough.   labetalol 100 MG tablet Commonly known as: NORMODYNE Take 100 mg by mouth at bedtime.   losartan 100 MG tablet Commonly known as: COZAAR Take 1 tablet (100 mg  total) by mouth every evening. What changed: when to take this   metFORMIN 500 MG 24 hr tablet Commonly known as: GLUCOPHAGE-XR Take 1 tablet (500 mg total) by mouth in the morning and at bedtime. Start taking on: July 22, 2023 What changed: These instructions start on July 22, 2023. If you are unsure what to do until then, ask your doctor or other care provider.   NON FORMULARY Pt uses cpap   Omega-3-6-9 Caps Take 1 capsule by mouth 2 (two) times daily.   oxyCODONE-acetaminophen 5-325 MG tablet Commonly known as: Percocet Take 1 tablet by mouth every 6 (six) hours as needed for severe pain (pain score 7-10).   pantoprazole 40 MG tablet Commonly known as: PROTONIX Take 1 tablet (40 mg total) by mouth daily before breakfast.   polyethylene glycol powder 17 GM/SCOOP powder Commonly known as: GLYCOLAX/MIRALAX TAKE 17MG (1CAPFUL) BY MOUTH(MIXED WITH WATER/JUICE) DAILY   rosuvastatin 10 MG tablet Commonly known as: CRESTOR Take 10 mg by mouth every morning.   tamsulosin 0.4 MG Caps capsule Commonly known as: FLOMAX Take 0.4 mg by mouth daily.   Vitamin D (Ergocalciferol) 1.25 MG (50000 UNIT) Caps capsule Commonly known as: DRISDOL Take 50,000 Units by mouth every Thursday.         [facility] Discharge Instructions Carotid Endarterectomy (CEA)  Please refer to the following instructions for your post-procedure care. Your surgeon or physician assistant will discuss any changes with you.  Activity  You are encouraged to walk as much as you can. You can slowly return to normal activities but must avoid strenuous activity and heavy lifting until your doctor tell you it's OK. Avoid activities such as vacuuming or swinging a golf club. You can drive after one week if you are comfortable and you are no longer taking prescription pain medications. It is normal to feel tired for serval weeks after your surgery. It is also normal to have  difficulty with sleep habits, eating, and bowel movements after surgery. These will go away with time.  Bathing/Showering  You may shower after you come home. Do not soak in a bathtub, hot tub, or swim until the incision heals completely.  Incision Care  Shower every day. Clean your incision with mild soap and water. Pat the area dry with a clean towel. You do not need a bandage unless otherwise instructed. Do not apply any ointments or creams to your incision. You may have skin glue on your incision. Do not peel it off. It will come off on its own in about one week. Your incision may feel thickened and raised for several weeks after your surgery. This is normal and the skin will soften over time. For Men Only: It's OK to shave around the incision but do not shave the incision itself for 2 weeks. It is common to have numbness under your chin that could last for several months.  Diet  Resume your normal diet. There are no special food restrictions following this procedure. A low fat/low cholesterol diet is recommended for all patients with vascular disease. In order to heal from your surgery, it is CRITICAL to get adequate nutrition. Your body requires vitamins, minerals, and protein. Vegetables are the best source of vitamins and minerals. Vegetables also provide the perfect balance of protein. Processed food has little nutritional value, so try to avoid this.  Medications  Resume taking all of your medications unless your doctor or physician assistant tells you not to.  If your incision is causing pain, you may take over-the- counter pain relievers such as acetaminophen (Tylenol). If you were prescribed a stronger pain medication, please be aware these medications can cause nausea and constipation.  Prevent nausea by taking the medication with a snack or meal. Avoid constipation by drinking plenty of fluids and eating foods with a high amount of fiber, such as fruits, vegetables, and grains.  Do  not take Tylenol if you are taking prescription pain medications.  Follow Up  Our office will schedule a follow up appointment 2-3 weeks following discharge.  Please call us immediately for any of the following conditions  Increased pain, redness, drainage (pus) from your incision site. Fever of 101 degrees or higher. If you should develop stroke (slurred speech, difficulty swallowing, weakness on one side of your body, loss of vision) you should call 911 and go to the nearest emergency room.  Reduce your risk of vascular disease:  Stop smoking. If you would like help call [facility] at 1-800-QUIT-NOW (450-808-8683) or [place] at 606-613-7647. Manage your cholesterol Maintain a desired weight Control your diabetes Keep your blood pressure down  If you have any questions, please call the office at [phone].  Prescriptions given: 1.   Roxicet #8 No Refill  Disposition: home  Patient's condition: is Good  Follow up: 1. [facility] in 4 weeks with carotid duplex on Dr. Randie Heinz clinic day   Doreatha Massed, PA-C [facility] [phone]   --- For [facility] Registry use ---   Modified Rankin score at D/C (0-6): 0  IV medication needed for:  1. Hypertension: No 2. Hypotension: No  Post-op Complications: No  1. Post-op CVA or TIA: No  If yes: Event classification (right eye, left eye, right cortical, left cortical, verterobasilar, other): n/a  If yes: Timing of event (intra-op, <6 hrs post-op, >=6 hrs post-op, unknown): n/a  2. CN injury: No  If yes: CN n/a injuried   3. Myocardial infarction: No  If yes: Dx by (EKG or clinical, Troponin): n/a  4.  CHF: No  5.  Dysrhythmia (new): No  6. Wound infection: No  7. Reperfusion symptoms: No  8. Return to OR: No  If yes:  return to OR for (bleeding, neurologic, other CEA incision, other): n/a  Discharge medications: Statin use:  Yes ASA use:  Yes   Beta blocker use:  Yes ACE-Inhibitor use:   No  ARB use:  Yes CCB use: Yes P2Y12 Antagonist use: Yes, [ X] Plavix, [ ]  Plasugrel, [ ]  Ticlopinine, [ ]  Ticagrelor, [ ]  Other, [ ]  No for medical reason, [ ]  Non-compliant, [ ]  Not-indicated Anti-coagulant use:  No, [ ]  Warfarin, [ ]  Rivaroxaban, [ ]  Dabigatran,

## 2023-07-20 NOTE — Progress Notes (Addendum)
  Progress Note    07/20/2023 9:17 AM 1 Day Post-Op  Subjective:  ready to go home.  Denies trouble swallowing. He has voided.  Needs to walk in the hallway.    Afebrile HR 50's-80's NSR 110's-140's systolic 94% RA  Gtts:  none  Vitals:   07/20/23 0721 07/20/23 0738  BP: 131/64 127/66  Pulse: 61 62  Resp: 14 (!) 21  Temp: 97.6 F (36.4 C) 98.1 F (36.7 C)  SpO2: 96% 97%     Physical Exam: Neuro:  in tact; tongue is midline; moving all extremities equally Lungs:  non labored Incision:  clean and dry without hematoma; right groin is soft.   CBC    Component Value Date/Time   WBC 16.6 (H) 07/20/2023 0425   RBC 3.01 (L) 07/20/2023 0425   HGB 9.6 (L) 07/20/2023 0425   HCT 29.0 (L) 07/20/2023 0425   PLT 175 07/20/2023 0425   MCV 96.3 07/20/2023 0425   MCH 31.9 07/20/2023 0425   MCHC 33.1 07/20/2023 0425   RDW 14.5 07/20/2023 0425   LYMPHSABS 1,197 04/10/2023 0809   MONOABS 0.8 09/20/2022 0655   EOSABS 389 07/10/2023 0754   BASOSABS 53 07/10/2023 0754    BMET    Component Value Date/Time   NA 138 07/20/2023 0425   K 3.9 07/20/2023 0425   CL 105 07/20/2023 0425   CO2 25 07/20/2023 0425   GLUCOSE 129 (H) 07/20/2023 0425   BUN 24 (H) 07/20/2023 0425   CREATININE 1.24 07/20/2023 0425   CREATININE 1.04 01/14/2023 0751   CALCIUM 9.0 07/20/2023 0425   GFRNONAA 60 (L) 07/20/2023 0425   GFRAA >60 02/26/2018 0551     Intake/Output Summary (Last 24 hours) at 07/20/2023 0917 Last data filed at 07/20/2023 0732 Gross per 24 hour  Intake 1514.78 ml  Output 50 ml  Net 1464.78 ml     Assessment/Plan:  This is a 77 y.o. male who is s/p left TCAR  1 Day Post-Op  -pt is doing well this am. -pt neuro exam is in tact -pt has not ambulated in the hallways -pt has voided -discharge this morning and f/u with [facility] in 4 weeks with carotid duplex on Dr. Randie Heinz clinic day   Doreatha Massed, PA-C [facility] [phone]   I agree with the above.  Have  seen and evaluated the patient.  He is postoperative day #1 from a left sided TCAR.  He is neurologically intact.  He has ambulated and voided, and is tolerating his diet.  Plan for discharge home today on aspirin, statin, and Plavix.  Durene Cal

## 2023-07-21 ENCOUNTER — Encounter (HOSPITAL_COMMUNITY): Payer: Self-pay | Admitting: Vascular Surgery

## 2023-07-26 ENCOUNTER — Encounter: Payer: Self-pay | Admitting: Internal Medicine

## 2023-08-09 ENCOUNTER — Encounter (HOSPITAL_COMMUNITY)
Admission: EM | Disposition: A | Discharge status: Home / Self Care | Payer: Self-pay | Source: Home / Self Care | Attending: Emergency Medicine

## 2023-08-09 ENCOUNTER — Encounter (HOSPITAL_COMMUNITY): Payer: Self-pay

## 2023-08-09 ENCOUNTER — Observation Stay (HOSPITAL_COMMUNITY): Payer: Medicare HMO | Admitting: Certified Registered Nurse Anesthetist

## 2023-08-09 ENCOUNTER — Observation Stay (HOSPITAL_COMMUNITY)
Admission: EM | Admit: 2023-08-09 | Discharge: 2023-08-10 | Disposition: A | Discharge status: Home / Self Care | Payer: Medicare HMO | Attending: Family Medicine | Admitting: Family Medicine

## 2023-08-09 ENCOUNTER — Other Ambulatory Visit: Payer: Self-pay

## 2023-08-09 DIAGNOSIS — R55 Syncope and collapse: Secondary | ICD-10-CM | POA: Diagnosis present

## 2023-08-09 DIAGNOSIS — Z8546 Personal history of malignant neoplasm of prostate: Secondary | ICD-10-CM | POA: Diagnosis not present

## 2023-08-09 DIAGNOSIS — Z7982 Long term (current) use of aspirin: Secondary | ICD-10-CM | POA: Insufficient documentation

## 2023-08-09 DIAGNOSIS — N400 Enlarged prostate without lower urinary tract symptoms: Secondary | ICD-10-CM | POA: Diagnosis present

## 2023-08-09 DIAGNOSIS — Z1152 Encounter for screening for COVID-19: Secondary | ICD-10-CM | POA: Diagnosis not present

## 2023-08-09 DIAGNOSIS — F17210 Nicotine dependence, cigarettes, uncomplicated: Secondary | ICD-10-CM | POA: Insufficient documentation

## 2023-08-09 DIAGNOSIS — I2510 Atherosclerotic heart disease of native coronary artery without angina pectoris: Secondary | ICD-10-CM

## 2023-08-09 DIAGNOSIS — I779 Disorder of arteries and arterioles, unspecified: Secondary | ICD-10-CM | POA: Diagnosis present

## 2023-08-09 DIAGNOSIS — R1030 Lower abdominal pain, unspecified: Secondary | ICD-10-CM | POA: Diagnosis not present

## 2023-08-09 DIAGNOSIS — Z7984 Long term (current) use of oral hypoglycemic drugs: Secondary | ICD-10-CM | POA: Diagnosis not present

## 2023-08-09 DIAGNOSIS — E119 Type 2 diabetes mellitus without complications: Secondary | ICD-10-CM | POA: Insufficient documentation

## 2023-08-09 DIAGNOSIS — Z8551 Personal history of malignant neoplasm of bladder: Secondary | ICD-10-CM | POA: Insufficient documentation

## 2023-08-09 DIAGNOSIS — R140 Abdominal distension (gaseous): Secondary | ICD-10-CM | POA: Diagnosis not present

## 2023-08-09 DIAGNOSIS — K2289 Other specified disease of esophagus: Secondary | ICD-10-CM | POA: Diagnosis not present

## 2023-08-09 DIAGNOSIS — Z23 Encounter for immunization: Secondary | ICD-10-CM | POA: Diagnosis not present

## 2023-08-09 DIAGNOSIS — K921 Melena: Secondary | ICD-10-CM

## 2023-08-09 DIAGNOSIS — D649 Anemia, unspecified: Principal | ICD-10-CM | POA: Insufficient documentation

## 2023-08-09 DIAGNOSIS — E785 Hyperlipidemia, unspecified: Secondary | ICD-10-CM | POA: Diagnosis present

## 2023-08-09 DIAGNOSIS — I10 Essential (primary) hypertension: Secondary | ICD-10-CM | POA: Insufficient documentation

## 2023-08-09 DIAGNOSIS — Z79899 Other long term (current) drug therapy: Secondary | ICD-10-CM | POA: Insufficient documentation

## 2023-08-09 DIAGNOSIS — Z720 Tobacco use: Secondary | ICD-10-CM | POA: Diagnosis present

## 2023-08-09 DIAGNOSIS — E782 Mixed hyperlipidemia: Secondary | ICD-10-CM

## 2023-08-09 DIAGNOSIS — K31811 Angiodysplasia of stomach and duodenum with bleeding: Secondary | ICD-10-CM | POA: Insufficient documentation

## 2023-08-09 DIAGNOSIS — D509 Iron deficiency anemia, unspecified: Secondary | ICD-10-CM | POA: Diagnosis not present

## 2023-08-09 DIAGNOSIS — Z7902 Long term (current) use of antithrombotics/antiplatelets: Secondary | ICD-10-CM | POA: Diagnosis not present

## 2023-08-09 DIAGNOSIS — M1A00X0 Idiopathic chronic gout, unspecified site, without tophus (tophi): Secondary | ICD-10-CM | POA: Diagnosis present

## 2023-08-09 DIAGNOSIS — Z96652 Presence of left artificial knee joint: Secondary | ICD-10-CM | POA: Insufficient documentation

## 2023-08-09 DIAGNOSIS — G4733 Obstructive sleep apnea (adult) (pediatric): Secondary | ICD-10-CM | POA: Diagnosis present

## 2023-08-09 HISTORY — PX: ESOPHAGOGASTRODUODENOSCOPY (EGD) WITH PROPOFOL: SHX5813

## 2023-08-09 HISTORY — PX: HOT HEMOSTASIS: SHX5433

## 2023-08-09 LAB — HEMOGLOBIN AND HEMATOCRIT, BLOOD
HCT: 22.5 % — ABNORMAL LOW (ref 39.0–52.0)
Hemoglobin: 7.3 g/dL — ABNORMAL LOW (ref 13.0–17.0)

## 2023-08-09 LAB — CBC
HCT: 20.6 % — ABNORMAL LOW (ref 39.0–52.0)
Hemoglobin: 6.5 g/dL — CL (ref 13.0–17.0)
MCH: 33.2 pg (ref 26.0–34.0)
MCHC: 31.6 g/dL (ref 30.0–36.0)
MCV: 105.1 fL — ABNORMAL HIGH (ref 80.0–100.0)
Platelets: 181 10*3/uL (ref 150–400)
RBC: 1.96 MIL/uL — ABNORMAL LOW (ref 4.22–5.81)
RDW: 18.7 % — ABNORMAL HIGH (ref 11.5–15.5)
WBC: 10.5 10*3/uL (ref 4.0–10.5)
nRBC: 0.3 % — ABNORMAL HIGH (ref 0.0–0.2)

## 2023-08-09 LAB — URINALYSIS, ROUTINE W REFLEX MICROSCOPIC
Bilirubin Urine: NEGATIVE
Glucose, UA: NEGATIVE mg/dL
Hgb urine dipstick: NEGATIVE
Ketones, ur: NEGATIVE mg/dL
Leukocytes,Ua: NEGATIVE
Nitrite: NEGATIVE
Protein, ur: NEGATIVE mg/dL
Specific Gravity, Urine: 1.011 (ref 1.005–1.030)
pH: 7 (ref 5.0–8.0)

## 2023-08-09 LAB — BASIC METABOLIC PANEL
Anion gap: 7 (ref 5–15)
BUN: 39 mg/dL — ABNORMAL HIGH (ref 8–23)
CO2: 25 mmol/L (ref 22–32)
Calcium: 8.8 mg/dL — ABNORMAL LOW (ref 8.9–10.3)
Chloride: 105 mmol/L (ref 98–111)
Creatinine, Ser: 1.18 mg/dL (ref 0.61–1.24)
GFR, Estimated: >60 mL/min (ref >60)
Glucose, Bld: 144 mg/dL — ABNORMAL HIGH (ref 70–99)
Potassium: 4.1 mmol/L (ref 3.5–5.1)
Sodium: 137 mmol/L (ref 135–145)

## 2023-08-09 LAB — PROTIME-INR
INR: 1 (ref 0.8–1.2)
Prothrombin Time: 13.8 s (ref 11.4–15.2)

## 2023-08-09 LAB — GLUCOSE, CAPILLARY
Glucose-Capillary: 118 mg/dL — ABNORMAL HIGH (ref 70–99)
Glucose-Capillary: 176 mg/dL — ABNORMAL HIGH (ref 70–99)
Glucose-Capillary: 172 mg/dL — ABNORMAL HIGH (ref 70–99)

## 2023-08-09 LAB — SARS CORONAVIRUS 2 BY RT PCR: SARS Coronavirus 2 by RT PCR: NEGATIVE

## 2023-08-09 LAB — TROPONIN I (HIGH SENSITIVITY)
Troponin I (High Sensitivity): 31 ng/L — ABNORMAL HIGH (ref <18)
Troponin I (High Sensitivity): 31 ng/L — ABNORMAL HIGH (ref <18)

## 2023-08-09 LAB — PREPARE RBC (CROSSMATCH)

## 2023-08-09 LAB — CBG MONITORING, ED: Glucose-Capillary: 140 mg/dL — ABNORMAL HIGH (ref 70–99)

## 2023-08-09 LAB — POC OCCULT BLOOD, ED: Fecal Occult Blood: POSITIVE — AB

## 2023-08-09 SURGERY — ESOPHAGOGASTRODUODENOSCOPY (EGD) WITH PROPOFOL
Anesthesia: General

## 2023-08-09 MED ORDER — GLUCAGON HCL RDNA (DIAGNOSTIC) 1 MG IJ SOLR
INTRAMUSCULAR | Status: DC | PRN
Start: 2023-08-09 — End: 2023-08-09
  Administered 2023-08-09: .5 mg via INTRAVENOUS

## 2023-08-09 MED ORDER — ACETAMINOPHEN 325 MG PO TABS
650.0000 mg | ORAL_TABLET | Freq: Four times a day (QID) | ORAL | Status: DC | PRN
  Administered 2023-08-09: 650 mg via ORAL
  Filled 2023-08-09: qty 2

## 2023-08-09 MED ORDER — INFLUENZA VAC A&B SURF ANT ADJ 0.5 ML IM SUSY
0.5000 mL | PREFILLED_SYRINGE | INTRAMUSCULAR | Status: AC
  Administered 2023-08-10: 0.5 mL via INTRAMUSCULAR
  Filled 2023-08-09: qty 0.5

## 2023-08-09 MED ORDER — ROSUVASTATIN CALCIUM 10 MG PO TABS
10.0000 mg | ORAL_TABLET | Freq: Every morning | ORAL | Status: DC
  Administered 2023-08-10: 10 mg via ORAL
  Filled 2023-08-09: qty 1

## 2023-08-09 MED ORDER — NICOTINE 14 MG/24HR TD PT24
14.0000 mg | MEDICATED_PATCH | Freq: Every day | TRANSDERMAL | Status: DC
  Administered 2023-08-09 – 2023-08-10 (×2): 14 mg via TRANSDERMAL
  Filled 2023-08-09 (×2): qty 1

## 2023-08-09 MED ORDER — SODIUM CHLORIDE 0.9% FLUSH: 3.0000 mL | INTRAVENOUS | Status: DC | PRN

## 2023-08-09 MED ORDER — LIDOCAINE HCL (PF) 2 % IJ SOLN
INTRAMUSCULAR | Status: AC
  Filled 2023-08-09: qty 5

## 2023-08-09 MED ORDER — ACETAMINOPHEN 650 MG RE SUPP: 650.0000 mg | Freq: Four times a day (QID) | RECTAL | Status: DC | PRN

## 2023-08-09 MED ORDER — GABAPENTIN 300 MG PO CAPS
300.0000 mg | ORAL_CAPSULE | Freq: Two times a day (BID) | ORAL | Status: DC
  Administered 2023-08-09 – 2023-08-10 (×2): 300 mg via ORAL
  Filled 2023-08-09 (×2): qty 1

## 2023-08-09 MED ORDER — PANTOPRAZOLE SODIUM 40 MG IV SOLR
40.0000 mg | Freq: Once | INTRAVENOUS | Status: AC
  Administered 2023-08-09: 40 mg via INTRAVENOUS
  Filled 2023-08-09: qty 10

## 2023-08-09 MED ORDER — FUROSEMIDE 40 MG PO TABS
40.0000 mg | ORAL_TABLET | Freq: Every morning | ORAL | Status: DC
  Administered 2023-08-10: 40 mg via ORAL
  Filled 2023-08-09: qty 1

## 2023-08-09 MED ORDER — SODIUM CHLORIDE 0.9 % IV SOLN: 250.0000 mL | INTRAVENOUS | Status: AC | PRN

## 2023-08-09 MED ORDER — PANTOPRAZOLE SODIUM 40 MG IV SOLR
40.0000 mg | Freq: Two times a day (BID) | INTRAVENOUS | Status: DC
  Administered 2023-08-09 – 2023-08-10 (×2): 40 mg via INTRAVENOUS
  Filled 2023-08-09 (×2): qty 10

## 2023-08-09 MED ORDER — LIDOCAINE HCL (CARDIAC) PF 100 MG/5ML IV SOSY
PREFILLED_SYRINGE | INTRAVENOUS | Status: DC | PRN
  Administered 2023-08-09: 50 mg via INTRAVENOUS

## 2023-08-09 MED ORDER — LABETALOL HCL 200 MG PO TABS
100.0000 mg | ORAL_TABLET | Freq: Every day | ORAL | Status: DC
  Filled 2023-08-09: qty 1

## 2023-08-09 MED ORDER — INSULIN ASPART 100 UNIT/ML IJ SOLN: 0.0000 [IU] | Freq: Every day | INTRAMUSCULAR | Status: DC

## 2023-08-09 MED ORDER — PNEUMOCOCCAL 20-VAL CONJ VACC 0.5 ML IM SUSY
0.5000 mL | PREFILLED_SYRINGE | INTRAMUSCULAR | Status: AC
  Administered 2023-08-10: 0.5 mL via INTRAMUSCULAR
  Filled 2023-08-09: qty 0.5

## 2023-08-09 MED ORDER — ALLOPURINOL 100 MG PO TABS
300.0000 mg | ORAL_TABLET | Freq: Every day | ORAL | Status: DC
  Administered 2023-08-09: 300 mg via ORAL
  Filled 2023-08-09: qty 3

## 2023-08-09 MED ORDER — LACTATED RINGERS IV SOLN: INTRAVENOUS | Status: DC | PRN

## 2023-08-09 MED ORDER — ONDANSETRON HCL 4 MG/2ML IJ SOLN: 4.0000 mg | Freq: Four times a day (QID) | INTRAMUSCULAR | Status: DC | PRN

## 2023-08-09 MED ORDER — SODIUM CHLORIDE 0.9% FLUSH
3.0000 mL | Freq: Two times a day (BID) | INTRAVENOUS | Status: DC
  Administered 2023-08-09 – 2023-08-10 (×3): 3 mL via INTRAVENOUS

## 2023-08-09 MED ORDER — LOSARTAN POTASSIUM 50 MG PO TABS
100.0000 mg | ORAL_TABLET | Freq: Every morning | ORAL | Status: DC
  Administered 2023-08-10: 100 mg via ORAL
  Filled 2023-08-09: qty 2

## 2023-08-09 MED ORDER — PHENYLEPHRINE 80 MCG/ML (10ML) SYRINGE FOR IV PUSH (FOR BLOOD PRESSURE SUPPORT)
PREFILLED_SYRINGE | INTRAVENOUS | Status: DC | PRN
  Administered 2023-08-09: 80 ug via INTRAVENOUS

## 2023-08-09 MED ORDER — STERILE WATER FOR IRRIGATION IR SOLN: Status: DC | PRN

## 2023-08-09 MED ORDER — ONDANSETRON HCL 4 MG PO TABS: 4.0000 mg | ORAL_TABLET | Freq: Four times a day (QID) | ORAL | Status: DC | PRN

## 2023-08-09 MED ORDER — SUCRALFATE 1 GM/10ML PO SUSP
1.0000 g | Freq: Four times a day (QID) | ORAL | Status: DC
  Administered 2023-08-09 – 2023-08-10 (×3): 1 g via ORAL
  Filled 2023-08-09 (×4): qty 10

## 2023-08-09 MED ORDER — SODIUM CHLORIDE 0.9% IV SOLUTION: Freq: Once | INTRAVENOUS | Status: AC

## 2023-08-09 MED ORDER — TAMSULOSIN HCL 0.4 MG PO CAPS
0.4000 mg | ORAL_CAPSULE | Freq: Every day | ORAL | Status: DC
  Administered 2023-08-10: 0.4 mg via ORAL
  Filled 2023-08-09: qty 1

## 2023-08-09 MED ORDER — INSULIN ASPART 100 UNIT/ML IJ SOLN
0.0000 [IU] | Freq: Three times a day (TID) | INTRAMUSCULAR | Status: DC
  Administered 2023-08-09: 2 [IU] via SUBCUTANEOUS

## 2023-08-09 MED ORDER — PROPOFOL 10 MG/ML IV BOLUS
INTRAVENOUS | Status: DC | PRN
  Administered 2023-08-09: 20 mg via INTRAVENOUS
  Administered 2023-08-09: 40 mg via INTRAVENOUS
  Administered 2023-08-09: 30 mg via INTRAVENOUS
  Administered 2023-08-09: 70 mg via INTRAVENOUS
  Administered 2023-08-09: 40 mg via INTRAVENOUS
  Administered 2023-08-09: 30 mg via INTRAVENOUS
  Administered 2023-08-09: 50 mg via INTRAVENOUS

## 2023-08-09 MED ORDER — LACTATED RINGERS IV SOLN: INTRAVENOUS | Status: DC

## 2023-08-09 MED ORDER — FINASTERIDE 5 MG PO TABS
5.0000 mg | ORAL_TABLET | Freq: Every day | ORAL | Status: DC
  Administered 2023-08-09: 5 mg via ORAL
  Filled 2023-08-09: qty 1

## 2023-08-09 NOTE — Transfer of Care (Signed)
Immediate Anesthesia Transfer of Care Note  Patient: Pryor R Angevine  Procedure(s) Performed: ESOPHAGOGASTRODUODENOSCOPY (EGD) WITH PROPOFOL HOT HEMOSTASIS (ARGON PLASMA COAGULATION/BICAP)  Patient Location: PACU  Anesthesia Type:General  Level of Consciousness: awake  Airway & Oxygen Therapy: Patient Spontanous Breathing and Patient connected to nasal cannula oxygen  Post-op Assessment: Report given to RN and Post -op Vital signs reviewed and stable  Post vital signs: Reviewed and stable  Last Vitals:  Vitals Value Taken Time  BP 102/41   Temp 97.4   Pulse 81 08/09/23 1650  Resp 28 08/09/23 1650  SpO2 98 % 08/09/23 1650  Vitals shown include unfiled device data.  Last Pain:  Vitals:   08/09/23 1614  TempSrc:   PainSc: 0-No pain      Patients Stated Pain Goal: 3 (08/09/23 1510)  Complications: No notable events documented.

## 2023-08-09 NOTE — Assessment & Plan Note (Signed)
-  Recent A1c 6.1 -Holding oral hypoglycemic agents while inpatient -Continue sliding scale insulin -Follow CBG fluctuation and adjust regimen as needed.

## 2023-08-09 NOTE — ED Notes (Signed)
See triage notes. Wound from left side neck from procedure intact and no ss of infection. Mild edema noted to RLE. Color wnl however family states pt is more pale than his normal. Abd soft. A/o. Moving all extremities.

## 2023-08-09 NOTE — Assessment & Plan Note (Signed)
-  Hemoglobin down to 6.5; patient reporting dizziness/lightheadedness, shortness of breath with activity and syncope event at home. -Positive fecal occult blood test. -Patient was type and screened and 1 unit PRBCs transfused. -Avoiding the use of heparin products and NSAIDs -GI service consulted and planning for endoscopy later today. -Keep patient n.p.o. -2 large IV bore requested -IV PPI twice a day initiated.

## 2023-08-09 NOTE — Assessment & Plan Note (Signed)
-  No chest pain -EKG without acute ischemic changes -Troponin negative -Continue home cardiac medication regimen -Continue outpatient follow-up with cardiology service.

## 2023-08-09 NOTE — Assessment & Plan Note (Signed)
-  Status post endarterectomy earlier this month -Healing adequately -At the moment holding aspirin and Plavix due to acute GI bleed -Continue statin. -Continue patient follow-up with vascular surgery.

## 2023-08-09 NOTE — H&P (Signed)
History and Physical    Patient: [name] UYQ:[id number] DOB: 1946/04/30 DOA: 08/09/2023 DOS: the patient was seen and examined on 08/09/2023 PCP: Benita Stabile, MD  Patient coming from: Home  Chief Complaint:  Chief Complaint  Patient presents with   Near Syncope   HPI: [name] is a 77 y.o. male with medical history significant of hypertension, hyperlipidemia, type 2 diabetes mellitus, coronary artery disease, BPH, history of prostate cancer, carotid artery stenosis (status post endarterectomy), coronary artery disease, obstructive sleep apnea and gouty arthritis; who presented to the hospital secondary to generalized weakness, shortness of breath with activity, dizziness when changing position and syncope.  Patient reports symptom has been present for the last 3 days or so and worsening.  No prodromic or alarming complaints.  Workup in the ED demonstrating hemoglobin of 6.5; positive fecal occult blood test, negative COVID PCR, no acute ischemic changes on EKG and negative troponin.  Normal WBCs and urinalysis no suggesting acute infection..  Patient reports no fever, no nausea, no vomiting, no chest pain, no shortness of breath or any other complaints.  Of note, patient chronically on celecoxib for treatment of gouty arthritis and recently started on aspirin and Plavix after endarterectomy.  Patient type and screen, 1 unit of PRBCs has been ordered and GI service consulted.  IV PPI twice a day initiated and TRH contacted to place patient in the hospital for further evaluation and management.  Review of Systems: As mentioned in the history of present illness. All other systems reviewed and are negative. Past Medical History:  Diagnosis Date   Anemia    Iron Deficiency   Bilateral renal cysts    Bladder cancer (HCC) UROLOGIST-  [facility]   RECURRENT 11/ 2017 --  hx turbt in 2004 and 08-19-2013   BPH without obstruction/lower urinary tract symptoms    Cervical fusion  syndrome    LEFT ARM NUMBNESS--  CONTROLLED W/ GABAPENTIN   Complication of anesthesia    Coronary artery disease    CARDIOLOGIST-- DR Wyline Mood Lorelle Formosa)   DDD (degenerative disc disease), cervical    DDD (degenerative disc disease), lumbar    Diverticulosis of sigmoid colon    Heart murmur    slight murmur per patient   History of acute gouty arthritis    History of adenomatous polyp of colon    2001;  2012- non-malignant leiomyoma;  04-07-2014  tubualr adenoma and hyperplastic polyp's   History of prostate cancer UROLOGIST-  [facility]   S/P  RADIOACTIVE SEED IMPLANTS 2004   HOH (hard of hearing)    no hearing aids   Hyperlipidemia    Hypertension    OA (osteoarthritis)    Occlusion and stenosis of carotid artery without mention of cerebral infarction CARDIOLOGIST  -- DR  Christiane Ha BRANCH   LAST DUPLEX  11-23-2015  RICA  (DUPLEX DOPPLER 01-20-2016 RICA 60-79% PROXIMAL) /   LICA <50% (HAD CONSULT W/ DR Imogene Burn , VASCULAR SURGEON 01-20-2016)   OSA on CPAP    BiPAP   Peripheral vascular disease (HCC)    Carotid Artery Stenosis   PONV (postoperative nausea and vomiting)    Prostate cancer (HCC)    per patient "years ago"- had radiation implant   Spondylosis, cervical    Type 2 diabetes mellitus (HCC)    Wears glasses    Past Surgical History:  Procedure Laterality Date   ANTERIOR REMOVAL CAGE AND PLATE G3-O7/ CORPECTOMY C7 (FX)/  ALLOGRAFT AND FUSION C3 -- T1/  POSTERIOR DECOMPRESSION BILATERAL LAMINECTOMY C4 -- 6 & PARTIAL C3/  POSTEROLATERAL ARTHRODESIS C3 - T1  06/05/2005   APPENDECTOMY  1990'S   BIOPSY  09/21/2022   Procedure: BIOPSY;  Surgeon: Corbin Ade, MD;  [facility];  Service: Endoscopy;;   CATARACT EXTRACTION W/ INTRAOCULAR LENS  IMPLANT, BILATERAL  2013   CERVICAL FUSION  05/11/2005   C3  --  C7/  due to post op quadriparesis same day s/p  anterior C 4,5,6, colpectomy, decompression, removal epidural hematoma, foraminnotomy, cage and plate    COLONOSCOPY  last one 04-07-2014   COLONOSCOPY WITH PROPOFOL N/A 09/21/2022   Procedure: COLONOSCOPY WITH PROPOFOL;  Surgeon: Corbin Ade, MD;  [facility];  Service: Endoscopy;  Laterality: N/A;   CYSTOSCOPY W/ RETROGRADES Bilateral 08/19/2013   Procedure: CYSTOSCOPY WITH RETROGRADE PYELOGRAM;  Surgeon: Milford Cage, MD;  Location: [facility];  Service: Urology;  Laterality: Bilateral;   CYSTOSCOPY W/ RETROGRADES Bilateral 07/18/2016   Procedure: CYSTOSCOPY WITH RETROGRADE PYELOGRAM;  Surgeon: Sebastian Ache, MD;  Location: [facility];  Service: Urology;  Laterality: Bilateral;   ENDOSCOPIC REPAIR CSF LEAK VIA NASAL PASSAGE  2011   ESOPHAGOGASTRODUODENOSCOPY (EGD) WITH PROPOFOL N/A 09/21/2022   Procedure: ESOPHAGOGASTRODUODENOSCOPY (EGD) WITH PROPOFOL;  Surgeon: Corbin Ade, MD;  [facility];  Service: Endoscopy;  Laterality: N/A;   HEMOSTASIS CLIP PLACEMENT  09/21/2022   Procedure: HEMOSTASIS CLIP PLACEMENT;  Surgeon: Corbin Ade, MD;  [facility];  Service: Endoscopy;;   INTRAOPERATIVE ARTERIOGRAM  CATH LAB  01-05-2009  DR Jacinto Halim   RICA   ACUTE ANGLE 80-85% STENOSIS   KNEE ARTHROSCOPY Left 07/15/2017   Procedure: ARTHROSCOPY LEFT  KNEE AND DEBRIDEMENT, medial meniscal tear and chondromalaita;  Surgeon: Durene Romans, MD;  Location: [facility];  Service: Orthopedics;  Laterality: Left;  60 MINS   LUMBAR FUSION  03-26-2012   L3 --  L5   POLYPECTOMY  09/21/2022   Procedure: POLYPECTOMY;  Surgeon: Corbin Ade, MD;  [facility];  Service: Endoscopy;;   RADIOACTIVE PROSTATE SEED IMPLANTS  08-19-2003   SEPTOPLASTY  1998   TOTAL KNEE ARTHROPLASTY Left 02/25/2018   Procedure: LEFT TOTAL KNEE ARTHROPLASTY;  Surgeon: Durene Romans, MD;  Location: [facility];  Service: Orthopedics;  Laterality: Left;  70 mins   TRANSCAROTID ARTERY REVASCULARIZATION  Right 05/11/2022   Procedure: Right Transcarotid Artery  Revascularization;  Surgeon: Maeola Harman, MD;  Location: [facility];  Service: Vascular;  Laterality: Right;   TRANSCAROTID ARTERY REVASCULARIZATION  Left 07/19/2023   Procedure: LEFT TRANSCAROTID ARTERY REVASCULARIZATION;  Surgeon: Maeola Harman, MD;  Location: [facility] OR;  Service: Vascular;  Laterality: Left;   TRANSURETHRAL RESECTION OF BLADDER TUMOR N/A 08/19/2013   Procedure: TRANSURETHRAL RESECTION OF BLADDER TUMOR (TURBT);  Surgeon: Milford Cage, MD;  Location: [facility];  Service: Urology;  Laterality: N/A;   TRANSURETHRAL RESECTION OF BLADDER TUMOR N/A 07/18/2016   Procedure: TRANSURETHRAL RESECTION OF BLADDER TUMOR (TURBT);  Surgeon: Sebastian Ache, MD;  Location: [facility];  Service: Urology;  Laterality: N/A;   ULTRASOUND GUIDANCE FOR VASCULAR ACCESS Right 07/19/2023   Procedure: ULTRASOUND GUIDANCE FOR VASCULAR ACCESS, RIGHT FEMORAL VEIN;  Surgeon: Maeola Harman, MD;  Location: [facility] OR;  Service: Vascular;  Laterality: Right;   YAG LASER APPLICATION  07/29/2012   Procedure: YAG LASER APPLICATION;  Surgeon: Loraine Leriche T. Nile Riggs, MD;  Location: [facility];  Service: Ophthalmology;  Laterality: Right;   Social History:  reports that he has been smoking cigarettes. He has a 47 pack-year smoking history. He has never used smokeless tobacco. He reports that he does not drink alcohol and does not use drugs.  Allergies  Allergen Reactions   Dilaudid [Hydromorphone Hcl] Other (See Comments)    "acts a little off"   Lyrica [Pregabalin] Nausea Only and Other (See Comments)    Make me feel "bad"   Toradol [Ketorolac Tromethamine] Other (See Comments)    CauseD  hallucination ?-states not sure/ avoids per his MD   Tramadol     Couldn't sleep    Family History  Problem Relation Age of Onset   Breast cancer Mother        mets   Diabetes Mother    Heart disease Father        MI   Colon cancer Brother    Cancer Sister         male organs   Diabetes Sister        family hx   Arthritis Other        entire family   Diabetes Brother        x 2    Prior to Admission medications   Medication Sig Start Date End Date Taking? Authorizing Provider  amoxicillin-clavulanate (AUGMENTIN) 875-125 MG tablet Take 1 tablet by mouth 2 (two) times daily. 08/08/23  Yes [provider]  acetaminophen (TYLENOL) 500 MG tablet Take 1,000 mg by mouth every 6 (six) hours as needed for moderate pain.    [provider]  albuterol (VENTOLIN HFA) 108 (90 Base) MCG/ACT inhaler Inhale 2 puffs into the lungs every 4 (four) hours as needed for shortness of breath or wheezing. 11/22/22   [provider]  allopurinol (ZYLOPRIM) 300 MG tablet Take 300 mg by mouth at bedtime.    [provider]  amLODipine (NORVASC) 10 MG tablet TAKE 1 TABLET BY MOUTH EVERY DAY 07/24/18   Antoine Poche, MD  aspirin 81 MG EC tablet Take 81 mg by mouth at bedtime.    [provider]  celecoxib (CELEBREX) 200 MG capsule Take 200 mg by mouth 2 (two) times daily. 12/19/22   [provider]  clopidogrel (PLAVIX) 75 MG tablet Take 75 mg by mouth daily.  07/11/16   [provider]  colchicine 0.6 MG tablet Take 0.6 mg by mouth 2 (two) times daily as needed (gout).     [provider]  cyanocobalamin (VITAMIN B12) 1000 MCG tablet Take 1 tablet (1,000 mcg total) by mouth daily. 09/21/22   Willeen Niece, MD  docusate sodium (COLACE) 100 MG capsule Take 100 mg by mouth at bedtime.    [provider]  ferrous sulfate 325 (65 FE) MG EC tablet Take 325 mg by mouth daily.    [provider]  finasteride (PROSCAR) 5 MG tablet Take 5 mg by mouth at bedtime.    [provider]  furosemide (LASIX) 40 MG tablet Take 40 mg by mouth every morning.    [provider]  gabapentin (NEURONTIN) 300 MG capsule Take 300 mg by mouth 2 (two) times daily.    [provider]   HYDROcodone bit-homatropine (HYCODAN) 5-1.5 MG/5ML syrup Take 5 mLs by mouth every 6 (six) hours as needed for cough.    [provider]  Iron-FA-B Cmp-C-Biot-Probiotic (FUSION PLUS) CAPS Take 1 capsule by mouth daily. 12/19/22   [provider]  labetalol (NORMODYNE) 100 MG  tablet Take 100 mg by mouth at bedtime.    [provider]  losartan (COZAAR) 100 MG tablet Take 1 tablet (100 mg total) by mouth every evening. Patient taking differently: Take 100 mg by mouth in the morning. 09/22/20   Antoine Poche, MD  metFORMIN (GLUCOPHAGE-XR) 500 MG 24 hr tablet Take 1 tablet (500 mg total) by mouth in the morning and at bedtime. 07/22/23   Rhyne, Ames Coupe, PA-C  NON FORMULARY Pt uses cpap    [provider]  Omega-3-6-9 CAPS Take 1 capsule by mouth 2 (two) times daily.    [provider]  oxyCODONE-acetaminophen (PERCOCET) 5-325 MG tablet Take 1 tablet by mouth every 6 (six) hours as needed for severe pain (pain score 7-10). 07/20/23   Rhyne, Ames Coupe, PA-C  pantoprazole (PROTONIX) 40 MG tablet Take 1 tablet (40 mg total) by mouth daily before breakfast. 07/16/23   Tiffany Kocher, PA-C  polyethylene glycol powder (GLYCOLAX/MIRALAX) powder TAKE 17MG (1CAPFUL) BY MOUTH(MIXED WITH WATER/JUICE) DAILY 05/02/15   Hart Carwin, MD  predniSONE (DELTASONE) 10 MG tablet Take 10 mg by mouth as directed. TAKE 1 TABLET BY MOUTH 3 TIMES DAILY FOR 2 DAYS, THEN 1 TABLET TWICE DAILY FOR 5 DAYS, THEN 1 TAB DAILY UNTIL FINISHED.    [provider]  rosuvastatin (CRESTOR) 10 MG tablet Take 10 mg by mouth every morning.     [provider]  tamsulosin (FLOMAX) 0.4 MG CAPS capsule Take 0.4 mg by mouth daily. 09/05/20   [provider]  Vitamin D, Ergocalciferol, (DRISDOL) 1.25 MG (50000 UNIT) CAPS capsule Take 50,000 Units by mouth every Thursday.    [provider]    Physical Exam: Vitals:   08/09/23 1651 08/09/23 1700 08/09/23 1711  08/09/23 1837  BP: (!) 102/41 (!) 107/43 (!) 127/59 117/77  Pulse: 81 79 75 80  Resp: (!) 28 (!) 27 (!) 23 20  Temp: (!) 97.4 F (36.3 C)  97.6 F (36.4 C)   TempSrc:      SpO2: 98% 96% 96% 98%  Weight:      Height:       General exam: Alert, awake, oriented x 3; reports no chest pain or shortness of breath.  Feeling weak. Respiratory system: Clear to auscultation. Respiratory effort normal.  Good saturation on room air. Cardiovascular system:RRR. No rubs or gallops; no JVD. Gastrointestinal system: Abdomen is nondistended, soft and nontender. No organomegaly or masses felt. Normal bowel sounds heard. Central nervous system: Alert and oriented. No focal neurological deficits. Extremities: No cyanosis or clubbing. Skin: No petechiae.;  Left sided neck with adequately healing wound from recent endarterectomy procedure (no drainage, no erythematous changes). Psychiatry: Judgement and insight appear normal. Mood & affect appropriate.   Data Reviewed: Basic metabolic panel: Sodium 137, potassium 4.1, chloride 105, bicarb 25, BUN 39, creatinine 1.18 and GFR >60 CBC: WBCs 10.5, hemoglobin 6.5 and platelet count 181K Troponin:31 >> 31 COVID PCR: Negative.   Assessment and Plan: Symptomatic anemia -Hemoglobin down to 6.5; patient reporting dizziness/lightheadedness, shortness of breath with activity and syncope event at home. -Positive fecal occult blood test. -Patient was type and screened and 1 unit PRBCs transfused. -Avoiding the use of heparin products and NSAIDs -GI service consulted and planning for endoscopy later today. -Keep patient n.p.o. -2 large IV bore requested -IV PPI twice a day initiated.  Hyperlipidemia -Continue statin.  Chronic gouty arthropathy -Continue treatment with allopurinol and as needed colchicine -No acute flare currently appreciated.  Carotid  artery disease without cerebral infarction [facility]) -Status post endarterectomy earlier this month -Healing  adequately -At the moment holding aspirin and Plavix due to acute GI bleed -Continue statin. -Continue patient follow-up with vascular surgery.  OSA (obstructive sleep apnea) -Continue CPAP nightly.  BPH (benign prostatic hyperplasia) -With history of prostate cancer -Continue the use of Proscar and Flomax -Continue follow-up with urology service. -No complaints of urinary retention currently appreciated.  CAD (coronary artery disease) -No chest pain -EKG without acute ischemic changes -Troponin negative -Continue home cardiac medication regimen -Continue outpatient follow-up with cardiology service.  HTN (hypertension) -Stable overall -Continue adjusted dose of home antihypertensive regimen -Discussed with patient and family member; currently NPO.  Controlled type 2 diabetes mellitus without complication, without long-term current use of insulin (HCC) -Recent A1c 6.1 -Holding oral hypoglycemic agents while inpatient -Continue sliding scale insulin -Follow CBG fluctuation and adjust regimen as needed.   Tobacco abuse -Cessation counseling provided -Nicotine patch has been ordered.   Advance Care Planning:   Code Status: Full Code   Consults: GI service  Family Communication: Daughter at bedside.  Severity of Illness: The appropriate patient status for this patient is OBSERVATION. Observation status is judged to be reasonable and necessary in order to provide the required intensity of service to ensure the patient's safety. The patient's presenting symptoms, physical exam findings, and initial radiographic and laboratory data in the context of their medical condition is felt to place them at decreased risk for further clinical deterioration. Furthermore, it is anticipated that the patient will be medically stable for discharge from the hospital within 2 midnights of admission.   Author: Vassie Loll, MD 08/09/2023 7:26 PM  For on call review [email or web address].

## 2023-08-09 NOTE — Assessment & Plan Note (Signed)
Continue statin. 

## 2023-08-09 NOTE — Assessment & Plan Note (Signed)
-  Continue treatment with allopurinol and as needed colchicine -No acute flare currently appreciated.

## 2023-08-09 NOTE — Op Note (Signed)
[facility] Patient Name: [name] Procedure Date: 08/09/2023 3:52 PM MRN: [id number] Date of Birth: 1945-09-23 Attending MD: Gennette Pac , MD, [id number] CSN: [id number] Age: 77 Admit Type: Inpatient Procedure:                Upper GI endoscopy/converted to enteroscopy with                            lesion ablation Indications:              Melena Providers:                Gennette Pac, MD, Angelica Ran, Lennice Sites                            Technician, Technician Referring MD:              Medicines:                Propofol per Anesthesia Complications:            No immediate complications. Estimated Blood Loss:     Estimated blood loss: none. Procedure:                Pre-Anesthesia Assessment:                           - Prior to the procedure, a History and Physical                            was performed, and patient medications and                            allergies were reviewed. The patient's tolerance of                            previous anesthesia was also reviewed. The risks                            and benefits of the procedure and the sedation                            options and risks were discussed with the patient.                            All questions were answered, and informed consent                            was obtained. Prior Anticoagulants: The patient has                            taken no anticoagulant or antiplatelet agents and                            last took Plavix (clopidogrel) 1 day prior to the  procedure. ASA Grade Assessment: III - A patient                            with severe systemic disease. After reviewing the                            risks and benefits, the patient was deemed in                            satisfactory condition to undergo the procedure.                           After obtaining informed consent, the endoscope was                            passed under  direct vision. Throughout the                            procedure, the patient's blood pressure, pulse, and                            oxygen saturations were monitored continuously. The                            GIF-H190 ([id number]) scope was introduced through the                            mouth, and advanced to the mid-jejunum. The upper                            GI endoscopy was accomplished without difficulty.                            The patient tolerated the procedure well. Scope In: 4:19:51 PM Scope Out: 4:46:50 PM Total Procedure Duration: 0 hours 26 minutes 59 seconds  Findings:      Couple of free-floating ribbonlike plaques in the lumen of the esophagus       - likely dried secretions versus medication residue; otherwise the       examined esophagus was normal.      The entire examined stomach was normal. Advanced scope into the second       portion of the duodenum. Found 2 AVMs with fresh blood on them. I       withdrew the adult gastroscope and obtained the pediatric colonoscope to       pursue limited enteroscopy. I advanced the scope easily across the       pyloric channel through the duodenum into what I believe was the mid       jejunum. From this level slowly withdrew the scope. There was no blood       in the jejunum or distal duodenum; no other lesions were seen at this       level. I came back up into the second portion of the duodenum where the       2 AVMs were again identified. They were thermally sealed with APC at 20  J each application. Impression:               - Normal esophagus.                           - Normal stomach.                           -Duodenal AVMs x 2; ablated as described above Moderate Sedation:      Moderate (conscious) sedation was personally administered by an       anesthesia professional. The following parameters were monitored: oxygen       saturation, heart rate, blood pressure, respiratory rate, EKG, adequacy       of  pulmonary ventilation, and response to care. Recommendation:           - Return patient to hospital ward for ongoing care.                           - Clear liquid diet.                           - Continue present medications. Plavix and aspirin                            at this time or obligatory. Would hold off on                            additional steroids. Trend H&H continue twice daily                            PPI add Carafate suspension 1 g 4 times daily for                            now. We can hold off on capsule study of the small                            bowel at this time. At patient request, I called                            Philippa Chester, RN [phone].                            Reviewed findings and recommendations. Her                            questions were answered. Procedure Code(s):        --- Professional ---                           [phone], Esophagogastroduodenoscopy, flexible,                            transoral; diagnostic, including collection of                            specimen(s)  by brushing or washing, when performed                            (separate procedure) Diagnosis Code(s):        --- Professional ---                           K92.1, Melena (includes Hematochezia) CPT copyright 2022 American Medical Association. All rights reserved. The codes documented in this report are preliminary and upon coder review may  be revised to meet current compliance requirements. Gerrit Friends. Kahleel Fadeley, MD Gennette Pac, MD 08/09/2023 5:15:26 PM This report has been signed electronically. Number of Addenda: 0

## 2023-08-09 NOTE — ED Notes (Signed)
Date and time results received: 08/09/23 1133 (use smartphrase ".now" to insert current time)  Test: HGB Critical Value: 6.5  Name of Provider Notified: Dr Charm Barges  Orders Received? Or Actions Taken?: Orders Received - See Orders for details

## 2023-08-09 NOTE — Assessment & Plan Note (Signed)
Continue CPAP nightly. °

## 2023-08-09 NOTE — Assessment & Plan Note (Signed)
-  With history of prostate cancer -Continue the use of Proscar and Flomax -Continue follow-up with urology service. -No complaints of urinary retention currently appreciated.

## 2023-08-09 NOTE — Anesthesia Preprocedure Evaluation (Signed)
Anesthesia Evaluation  Patient identified by MRN, date of birth, ID band Patient awake    Reviewed: Allergy & Precautions, H&P , NPO status , Patient's Chart, lab work & pertinent test results, reviewed documented beta blocker date and time   History of Anesthesia Complications (+) PONV and history of anesthetic complications  Airway Mallampati: II  TM Distance: >3 FB Neck ROM: full    Dental no notable dental hx.    Pulmonary neg pulmonary ROS, sleep apnea , pneumonia, Current Smoker and Patient abstained from smoking.   Pulmonary exam normal breath sounds clear to auscultation       Cardiovascular Exercise Tolerance: Good hypertension, + CAD and + Peripheral Vascular Disease  negative cardio ROS + Valvular Problems/Murmurs  Rhythm:regular Rate:Normal     Neuro/Psych negative neurological ROS  negative psych ROS   GI/Hepatic negative GI ROS, Neg liver ROS,,,  Endo/Other  negative endocrine ROSdiabetes    Renal/GU Renal diseasenegative Renal ROS  negative genitourinary   Musculoskeletal   Abdominal   Peds  Hematology negative hematology ROS (+) Blood dyscrasia, anemia   Anesthesia Other Findings   Reproductive/Obstetrics negative OB ROS                             Anesthesia Physical Anesthesia Plan  ASA: 3  Anesthesia Plan: General and General ETT   Post-op Pain Management:    Induction:   PONV Risk Score and Plan: Ondansetron  Airway Management Planned:   Additional Equipment:   Intra-op Plan:   Post-operative Plan:   Informed Consent: I have reviewed the patients History and Physical, chart, labs and discussed the procedure including the risks, benefits and alternatives for the proposed anesthesia with the patient or authorized representative who has indicated his/her understanding and acceptance.     Dental Advisory Given  Plan Discussed with: CRNA  Anesthesia  Plan Comments:        Anesthesia Quick Evaluation

## 2023-08-09 NOTE — ED Provider Notes (Signed)
[facility]ERGENCY DEPARTMENT AT [facility] Provider Note   CSN: [id number] Arrival date & time: 08/09/23  1008     History  Chief Complaint  Patient presents with   Near Syncope    [name] is a 77 y.o. male.  He is here for evaluation of weakness dizziness and possibly syncopal event overnight.  He said he has not felt well for a few weeks.  Recently had a carotid endarterectomy.  Saw his primary care doctor few days ago for some congestion and feeling rundown and got put on Bactrim.  Has had some dry heaves not sure if it is the antibiotics.  He does notice his stools have been dark but he is on iron.  He says he feels like when he had his GI bleed in the past.  He is having a little bit of chest discomfort.  No headache or fever no abdominal pain no diarrhea or constipation no urinary symptoms.  Cough with a little bit of creamy sputum.  The history is provided by the patient and a relative.  Loss of Consciousness Episode history:  Single Most recent episode:  Today Progression:  Resolved Chronicity:  New Context: standing up   Witnessed: no   Associated symptoms: chest pain, dizziness and nausea   Associated symptoms: no diaphoresis, no difficulty breathing, no focal weakness, no headaches and no shortness of breath        Home Medications Prior to Admission medications   Medication Sig Start Date End Date Taking? Authorizing Provider  acetaminophen (TYLENOL) 500 MG tablet Take 1,000 mg by mouth every 6 (six) hours as needed for moderate pain.    [provider]  albuterol (VENTOLIN HFA) 108 (90 Base) MCG/ACT inhaler Inhale 2 puffs into the lungs every 4 (four) hours as needed for shortness of breath or wheezing. 11/22/22   [provider]  allopurinol (ZYLOPRIM) 300 MG tablet Take 300 mg by mouth at bedtime.    [provider]  amLODipine (NORVASC) 10 MG tablet TAKE 1 TABLET BY MOUTH EVERY DAY 07/24/18   Antoine Poche, MD   aspirin 81 MG EC tablet Take 81 mg by mouth at bedtime.    [provider]  celecoxib (CELEBREX) 200 MG capsule Take 200 mg by mouth 2 (two) times daily. 12/19/22   [provider]  clopidogrel (PLAVIX) 75 MG tablet Take 75 mg by mouth daily.  07/11/16   [provider]  colchicine 0.6 MG tablet Take 0.6 mg by mouth 2 (two) times daily as needed (gout).     [provider]  cyanocobalamin (VITAMIN B12) 1000 MCG tablet Take 1 tablet (1,000 mcg total) by mouth daily. 09/21/22   Willeen Niece, MD  docusate sodium (COLACE) 100 MG capsule Take 100 mg by mouth at bedtime.    [provider]  ferrous sulfate 325 (65 FE) MG EC tablet Take 325 mg by mouth daily.    [provider]  finasteride (PROSCAR) 5 MG tablet Take 5 mg by mouth at bedtime.    [provider]  furosemide (LASIX) 40 MG tablet Take 40 mg by mouth every morning.    [provider]  gabapentin (NEURONTIN) 300 MG capsule Take 300 mg by mouth 2 (two) times daily.    [provider]  HYDROcodone bit-homatropine (HYCODAN) 5-1.5 MG/5ML syrup Take 5 mLs by mouth every 6 (six) hours as needed for cough.    [provider]  Iron-FA-B Cmp-C-Biot-Probiotic (FUSION PLUS)  CAPS Take 1 capsule by mouth daily. 12/19/22   [provider]  labetalol (NORMODYNE) 100 MG tablet Take 100 mg by mouth at bedtime.    [provider]  losartan (COZAAR) 100 MG tablet Take 1 tablet (100 mg total) by mouth every evening. Patient taking differently: Take 100 mg by mouth in the morning. 09/22/20   Antoine Poche, MD  metFORMIN (GLUCOPHAGE-XR) 500 MG 24 hr tablet Take 1 tablet (500 mg total) by mouth in the morning and at bedtime. 07/22/23   Rhyne, Ames Coupe, PA-C  NON FORMULARY Pt uses cpap    [provider]  Omega-3-6-9 CAPS Take 1 capsule by mouth 2 (two) times daily.    [provider]  oxyCODONE-acetaminophen (PERCOCET) 5-325 MG tablet Take  1 tablet by mouth every 6 (six) hours as needed for severe pain (pain score 7-10). 07/20/23   Rhyne, Ames Coupe, PA-C  pantoprazole (PROTONIX) 40 MG tablet Take 1 tablet (40 mg total) by mouth daily before breakfast. 07/16/23   Tiffany Kocher, PA-C  polyethylene glycol powder (GLYCOLAX/MIRALAX) powder TAKE 17MG (1CAPFUL) BY MOUTH(MIXED WITH WATER/JUICE) DAILY 05/02/15   Hart Carwin, MD  rosuvastatin (CRESTOR) 10 MG tablet Take 10 mg by mouth every morning.     [provider]  tamsulosin (FLOMAX) 0.4 MG CAPS capsule Take 0.4 mg by mouth daily. 09/05/20   [provider]  Vitamin D, Ergocalciferol, (DRISDOL) 1.25 MG (50000 UNIT) CAPS capsule Take 50,000 Units by mouth every Thursday.    [provider]      Allergies    Dilaudid [hydromorphone hcl], Lyrica [pregabalin], Toradol [ketorolac tromethamine], and Tramadol    Review of Systems   Review of Systems  Constitutional:  Negative for diaphoresis.  Respiratory:  Negative for shortness of breath.   Cardiovascular:  Positive for chest pain and syncope.  Gastrointestinal:  Positive for nausea. Negative for abdominal pain.  Genitourinary:  Negative for dysuria.  Neurological:  Positive for dizziness. Negative for focal weakness and headaches.    Physical Exam Updated Vital Signs BP (!) 127/59 (BP Location: Right Arm)   Pulse 75   Temp 97.6 F (36.4 C)   Resp (!) 23   Ht 5\' 7"  (1.702 m)   Wt 81.7 kg   SpO2 96%   BMI 28.19 kg/m  Physical Exam Vitals and nursing note reviewed.  Constitutional:      General: He is not in acute distress.    Appearance: He is well-developed.  HENT:     Head: Normocephalic and atraumatic.  Eyes:     Conjunctiva/sclera: Conjunctivae normal.  Cardiovascular:     Rate and Rhythm: Normal rate and regular rhythm.     Heart sounds: Murmur heard.  Pulmonary:     Effort: Pulmonary effort is normal. No respiratory distress.     Breath sounds: Normal breath sounds.  Abdominal:      Palpations: Abdomen is soft.     Tenderness: There is no abdominal tenderness. There is no guarding or rebound.  Genitourinary:    Comments: Rectal exam done with nurse Deanna as chaperone.  Strongly positive.  No masses appreciated, normal tone Musculoskeletal:        General: No swelling.     Cervical back: Neck supple.  Skin:    General: Skin is warm and dry.     Capillary Refill: Capillary refill takes less than 2 seconds.  Neurological:     General: No focal deficit present.     Mental Status:  He is alert.     Sensory: No sensory deficit.     Motor: No weakness.     ED Results / Procedures / Treatments   Labs (all labs ordered are listed, but only abnormal results are displayed) Labs Reviewed  BASIC METABOLIC PANEL - Abnormal; Notable for the following components:      Result Value   Glucose, Bld 144 (*)    BUN 39 (*)    Calcium 8.8 (*)    All other components within normal limits  CBC - Abnormal; Notable for the following components:   RBC 1.96 (*)    Hemoglobin 6.5 (*)    HCT 20.6 (*)    MCV 105.1 (*)    RDW 18.7 (*)    nRBC 0.3 (*)    All other components within normal limits  URINALYSIS, ROUTINE W REFLEX MICROSCOPIC - Abnormal; Notable for the following components:   Color, Urine STRAW (*)    All other components within normal limits  GLUCOSE, CAPILLARY - Abnormal; Notable for the following components:   Glucose-Capillary 118 (*)    All other components within normal limits  GLUCOSE, CAPILLARY - Abnormal; Notable for the following components:   Glucose-Capillary 176 (*)    All other components within normal limits  CBG MONITORING, ED - Abnormal; Notable for the following components:   Glucose-Capillary 140 (*)    All other components within normal limits  POC OCCULT BLOOD, ED - Abnormal; Notable for the following components:   Fecal Occult Blood Positive (*)    All other components within normal limits  TROPONIN I (HIGH SENSITIVITY) - Abnormal; Notable  for the following components:   Troponin I (High Sensitivity) 31 (*)    All other components within normal limits  TROPONIN I (HIGH SENSITIVITY) - Abnormal; Notable for the following components:   Troponin I (High Sensitivity) 31 (*)    All other components within normal limits  SARS CORONAVIRUS 2 BY RT PCR  PROTIME-INR  BASIC METABOLIC PANEL  CBC  TYPE AND SCREEN  PREPARE RBC (CROSSMATCH)    EKG EKG Interpretation Date/Time:  Friday August 09 2023 10:20:42 EST Ventricular Rate:  80 PR Interval:  164 QRS Duration:  85 QT Interval:  404 QTC Calculation: 466 R Axis:   17  Text Interpretation: Sinus rhythm Borderline repolarization abnormality No significant change since prior 1/24 Confirmed by Meridee Score [phone]) on 08/09/2023 10:50:43 AM  Radiology No results found.  Procedures .Critical Care  Performed by: Terrilee Files, MD Authorized by: Terrilee Files, MD   Critical care provider statement:    Critical care time (minutes):  45   Critical care time was exclusive of:  Separately billable procedures and treating other patients   Critical care was necessary to treat or prevent imminent or life-threatening deterioration of the following conditions:  Circulatory failure   Critical care was time spent personally by me on the following activities:  Development of treatment plan with patient or surrogate, discussions with consultants, evaluation of patient's response to treatment, examination of patient, obtaining history from patient or surrogate, ordering and performing treatments and interventions, ordering and review of laboratory studies, ordering and review of radiographic studies, pulse oximetry, re-evaluation of patient's condition and review of old charts   I assumed direction of critical care for this patient from another provider in my specialty: no       Medications Ordered in ED Medications  sodium chloride flush (NS) 0.9 % injection 3 mL ( Intravenous  Automatically Held 08/17/23 2200)  sodium chloride flush (NS) 0.9 % injection 3 mL ( Intravenous MAR Hold 08/09/23 1507)  0.9 %  sodium chloride infusion ( Intravenous MAR Hold 08/09/23 1507)  acetaminophen (TYLENOL) tablet 650 mg ( Oral MAR Hold 08/09/23 1507)    Or  acetaminophen (TYLENOL) suppository 650 mg ( Rectal MAR Hold 08/09/23 1507)  ondansetron (ZOFRAN) tablet 4 mg ( Oral MAR Hold 08/09/23 1507)    Or  ondansetron (ZOFRAN) injection 4 mg ( Intravenous MAR Hold 08/09/23 1507)  insulin aspart (novoLOG) injection 0-9 Units ( Subcutaneous Automatically Held 08/17/23 1700)  insulin aspart (novoLOG) injection 0-5 Units ( Subcutaneous Automatically Held 08/17/23 2200)  allopurinol (ZYLOPRIM) tablet 300 mg ( Oral Automatically Held 08/17/23 2200)  finasteride (PROSCAR) tablet 5 mg ( Oral Automatically Held 08/17/23 2200)  furosemide (LASIX) tablet 40 mg ( Oral Automatically Held 08/17/23 1000)  gabapentin (NEURONTIN) capsule 300 mg ( Oral Automatically Held 08/17/23 2200)  labetalol (NORMODYNE) tablet 100 mg ( Oral Automatically Held 08/17/23 2200)  tamsulosin (FLOMAX) capsule 0.4 mg ( Oral Automatically Held 08/17/23 1000)  rosuvastatin (CRESTOR) tablet 10 mg ( Oral Automatically Held 08/17/23 1000)  losartan (COZAAR) tablet 100 mg ( Oral Automatically Held 08/18/23 0700)  pantoprazole (PROTONIX) injection 40 mg ( Intravenous Automatically Held 08/17/23 2200)  pneumococcal 20-valent conjugate vaccine (PREVNAR 20) injection 0.5 mL ( Intramuscular Automatically Held 08/10/23 1000)  influenza vaccine adjuvanted (FLUAD) injection 0.5 mL ( Intramuscular Automatically Held 08/10/23 1000)  lactated ringers infusion (has no administration in time range)  sucralfate (CARAFATE) 1 GM/10ML suspension 1 g (has no administration in time range)  pantoprazole (PROTONIX) injection 40 mg (40 mg Intravenous Given 08/09/23 1114)  0.9 %  sodium chloride infusion (Manually program via Guardrails IV Fluids) (  Intravenous New Bag/Given 08/09/23 1416)    ED Course/ Medical Decision Making/ A&P Clinical Course as of 08/09/23 1718  Fri Aug 09, 2023  1142 I reviewed results of labs with patient and his daughter.  He is agreeable to admission and transfusion.  Have ordered 1 unit of packed red blood cells.  Will reach out to GI for further recommendations. [MB]  1201 Discussed with Dr. Jena Gauss GI who agrees with current management and recommends admission to the hospital for further evaluation [MB]    Clinical Course User Index [MB] Terrilee Files, MD                                 Medical Decision Making Amount and/or Complexity of Data Reviewed Labs: ordered.  Risk Prescription drug management. Decision regarding hospitalization.   This patient complains of fatigue near syncope; this involves an extensive number of treatment Options and is a complaint that carries with it a high risk of complications and morbidity. The differential includes arrhythmia, dehydration, anemia, GI bleed, ACS, infection  I ordered, reviewed and interpreted labs, which included CBC with normal white count but low hemoglobin at 6.5, chemistries with elevated BUN, fecal occult positive, troponins elevated need to be trended I ordered medication IV fluids IV Protonix transfusion of packed red blood cells and reviewed PMP when indicated. Additional history obtained from patient's daughter Previous records obtained and reviewed in epic including recent discharge summary after carotid endarterectomy I consulted GI Dr. Jena Gauss and [facility] hospitalist Dr. Gwenlyn Perking and discussed lab and imaging findings and discussed disposition.  Cardiac monitoring reviewed, sinus rhythm Social determinants considered, no significant barriers Critical Interventions: Workup  and initiation of transfusion forGI bleeding with symptomatic anemia  After the interventions stated above, I reevaluated the patient and found patient to be  symptomatically feeling somewhat improved Admission and further testing considered, he will be admitted to the hospital evaluated by GI for further workup.  Patient in agreement with plan for admission.         Final Clinical Impression(s) / ED Diagnoses Final diagnoses:  Gastrointestinal hemorrhage with melena  Symptomatic anemia    Rx / DC Orders ED Discharge Orders     None         Terrilee Files, MD 08/09/23 1724

## 2023-08-09 NOTE — ED Triage Notes (Signed)
Pt c/o SOB, weakness for over a week. Pt became dizzy last night and this morning had a syncopal episode. Per family pt was seen at PCP where pt did not tell the full details of what has been going on and was prescribed bactrum and started taking the medication last night. Pt had endocardectomy on 07/19/2023.

## 2023-08-09 NOTE — Consult Note (Signed)
Gastroenterology Consult   Referring Provider: No ref. provider found Primary Care Physician:  Benita Stabile, MD Primary Gastroenterologist:  Dr. Marletta Lor  Patient ID: [name]; [id number]; 04/26/1946   Admit date: 08/09/2023  LOS: 0 days   Date of Consultation: 08/09/2023  Reason for Consultation:  symptomatic anemia, heme positive stool  History of Present Illness   [name] is a 77 y.o. year old male with history of bladder cancer s/p TURP in 2004 and 2014, IDA, prostate cancer, heart murmur, arthritis, CAD, OSA on CPAP, PVD, type 2 diabetes, HTN, carotid artery stenosis, and HLD who presented to the ED for evaluation of dizziness, weakness, and possible syncopal episode overnight.  Recently underwent transcarotid artery revascularization on 11/1 and has been maintained on aspirin and Plavix outpatient.  Has also presented with mild chest discomfort, dry heaves, and a cough.  GI consulted given symptomatic anemia and heme positive stool.   ED Course: Labs - Hgb 6.5, MCV 105.1, platelets 181, BUN 39, creatinine 1.18, potassium 4.1, high-sensitivity troponin 31, fecal occult positive, INR 1 Vitals -vital signs stable, afebrile, respiratory rate 17-22  Consult:  Daughter-in-law Wilkie Aye [place], Charity fundraiser) present at bedside.  Patient states he has been noticing darker/blacker stools over the last 1-1.5 weeks.  He continues to be on Celebrex and has also been taking a steroid taper since after his procedure on 11/1, currently has about 2-3 days left. Has been taking his PPI. States chest discomfort is much improved since starting the blood and does report a cough for which he was given bactrim by his PCP yesterday and thinks that made him nauseas. He denies alcohol use but does admit to tobacco use with 1 ppd. Denies any NSAIDs in addition to the celebrex. Denies abdominal pain, constipation, BRBPR.  Had hospitalization in January 2024 with hemoglobin of 7 that was a decline from 11 and  also with heme positive stool at the time.  He received 1 unit of PRBCs.  His iron studies revealed a low ferritin of 11 and a low B12 of 156.  He was noted to be on aspirin and Plavix for history of cardiac stent in the past and was having melenotic stools.  He underwent EGD and colonoscopy as outlined below and his hemoglobin was 7.8 on discharge.  He had a follow-up hemoglobin thereafter which improved to 9.4.  Last seen in the office 07/16/2023 where he reported achy joints and he was no longer taking pantoprazole at the time, elected for H. pylori treatment.  He denied any melena or rectal bleeding at the time and was taking MiraLAX and stool softener and having regular bowel movements.  He was advised to repeat CBC and anemia labs in 3 months and to only take iron infusion plus once daily and continue B12 supplement.  He was advised to resume pantoprazole 40 mg once daily for GI protection.  Advised that the other significant drop in hemoglobin or persistent iron deficiency we would need to consider capsule endoscopy.  Labs 07/22/2023 with PCP: Iron 31, iron saturation 13%, A1c 6.1.  Normal lipid panel.  BUN 30, glucose 109, normal LFTs, hemoglobin 10.3, MCV 99  Had documented eradication of H. pylori in April 2024 via stool sample.  Prior endoscopic workup: Colonoscopy 09/21/2022: - Diverticulosis in the sigmoid colon, in the descending colon and at the splenic flexure. - One 10 mm polyp in the cecum, removed with a cold snare. Resected and retrieved.  - The examination was otherwise normal  on direct and retroflexion views. Cannot rule out a diverticular bleed here. -Pathology revealed tubular adenomas -Recommend repeat in 3 years  EGD 09/21/2022: - Normal esophagus.  - Abnormal gastric mucosa of uncertain significance (Reticulated, mottled appearance of the gastric mucosa diffusely), status post biopsy.  - Normal duodenal bulb and second portion of the duodenum. -Biopsies revealed H. pylori  gastritis -Patient treated with Pylera  Colonoscopy July 2015: -2 sessile polyps ranging 3 to 5 mm in the cecum and rectum (rectal polyp removed but not cecal polyp given on Plavix) -Moderate to severe diverticulosis in the sigmoid colon -Complete resolution of inflammatory changes seen on CT scan -Cecal biopsy revealed to be a tubular adenoma, rectal polyp revealed to be hyperplastic   Past Medical History:  Diagnosis Date   Anemia    Iron Deficiency   Bilateral renal cysts    Bladder cancer (HCC) UROLOGIST-  DR [facility]   RECURRENT 11/ 2017 --  hx turbt in 2004 and 08-19-2013   BPH without obstruction/lower urinary tract symptoms    Cervical fusion syndrome    LEFT ARM NUMBNESS--  CONTROLLED W/ GABAPENTIN   Complication of anesthesia    Coronary artery disease    CARDIOLOGIST-- DR Wyline Mood Lorelle Formosa)   DDD (degenerative disc disease), cervical    DDD (degenerative disc disease), lumbar    Diverticulosis of sigmoid colon    Heart murmur    slight murmur per patient   History of acute gouty arthritis    History of adenomatous polyp of colon    2001;  2012- non-malignant leiomyoma;  04-07-2014  tubualr adenoma and hyperplastic polyp's   History of prostate cancer UROLOGIST-  DR [facility]   S/P  RADIOACTIVE SEED IMPLANTS 2004   HOH (hard of hearing)    no hearing aids   Hyperlipidemia    Hypertension    OA (osteoarthritis)    Occlusion and stenosis of carotid artery without mention of cerebral infarction CARDIOLOGIST  -- DR  Christiane Ha BRANCH   LAST DUPLEX  11-23-2015  RICA  (DUPLEX DOPPLER 01-20-2016 RICA 60-79% PROXIMAL) /   LICA <50% (HAD CONSULT W/ DR Imogene Burn , VASCULAR SURGEON 01-20-2016)   OSA on CPAP    BiPAP   Peripheral vascular disease (HCC)    Carotid Artery Stenosis   PONV (postoperative nausea and vomiting)    Prostate cancer (HCC)    per patient "years ago"- had radiation implant   Spondylosis, cervical    Type 2 diabetes mellitus (HCC)    Wears glasses      Past Surgical History:  Procedure Laterality Date   ANTERIOR REMOVAL CAGE AND PLATE Z6-X0/ CORPECTOMY C7 (FX)/  ALLOGRAFT AND FUSION C3 -- T1/  POSTERIOR DECOMPRESSION BILATERAL LAMINECTOMY C4 -- 6 & PARTIAL C3/  POSTEROLATERAL ARTHRODESIS C3 - T1  06/05/2005   APPENDECTOMY  1990'S   BIOPSY  09/21/2022   Procedure: BIOPSY;  Surgeon: Corbin Ade, MD;  [facility];  Service: Endoscopy;;   CATARACT EXTRACTION W/ INTRAOCULAR LENS  IMPLANT, BILATERAL  2013   CERVICAL FUSION  05/11/2005   C3  --  C7/  due to post op quadriparesis same day s/p  anterior C 4,5,6, colpectomy, decompression, removal epidural hematoma, foraminnotomy, cage and plate   COLONOSCOPY  last one 04-07-2014   COLONOSCOPY WITH PROPOFOL N/A 09/21/2022   Procedure: COLONOSCOPY WITH PROPOFOL;  Surgeon: Corbin Ade, MD;  [facility];  Service: Endoscopy;  Laterality: N/A;   CYSTOSCOPY W/ RETROGRADES Bilateral 08/19/2013  Procedure: CYSTOSCOPY WITH RETROGRADE PYELOGRAM;  Surgeon: Milford Cage, MD;  Location: [facility];  Service: Urology;  Laterality: Bilateral;   CYSTOSCOPY W/ RETROGRADES Bilateral 07/18/2016   Procedure: CYSTOSCOPY WITH RETROGRADE PYELOGRAM;  Surgeon: Sebastian Ache, MD;  Location: [facility];  Service: Urology;  Laterality: Bilateral;   ENDOSCOPIC REPAIR CSF LEAK VIA NASAL PASSAGE  2011   ESOPHAGOGASTRODUODENOSCOPY (EGD) WITH PROPOFOL N/A 09/21/2022   Procedure: ESOPHAGOGASTRODUODENOSCOPY (EGD) WITH PROPOFOL;  Surgeon: Corbin Ade, MD;  [facility];  Service: Endoscopy;  Laterality: N/A;   HEMOSTASIS CLIP PLACEMENT  09/21/2022   Procedure: HEMOSTASIS CLIP PLACEMENT;  Surgeon: Corbin Ade, MD;  [facility];  Service: Endoscopy;;   INTRAOPERATIVE ARTERIOGRAM  CATH LAB  01-05-2009  DR Jacinto Halim   RICA   ACUTE ANGLE 80-85% STENOSIS   KNEE ARTHROSCOPY Left 07/15/2017   Procedure: ARTHROSCOPY LEFT  KNEE AND DEBRIDEMENT,  medial meniscal tear and chondromalaita;  Surgeon: Durene Romans, MD;  Location: [facility];  Service: Orthopedics;  Laterality: Left;  60 MINS   LUMBAR FUSION  03-26-2012   L3 --  L5   POLYPECTOMY  09/21/2022   Procedure: POLYPECTOMY;  Surgeon: Corbin Ade, MD;  [facility];  Service: Endoscopy;;   RADIOACTIVE PROSTATE SEED IMPLANTS  08-19-2003   SEPTOPLASTY  1998   TOTAL KNEE ARTHROPLASTY Left 02/25/2018   Procedure: LEFT TOTAL KNEE ARTHROPLASTY;  Surgeon: Durene Romans, MD;  Location: [facility];  Service: Orthopedics;  Laterality: Left;  70 mins   TRANSCAROTID ARTERY REVASCULARIZATION  Right 05/11/2022   Procedure: Right Transcarotid Artery Revascularization;  Surgeon: Maeola Harman, MD;  Location: [facility] OR;  Service: Vascular;  Laterality: Right;   TRANSCAROTID ARTERY REVASCULARIZATION  Left 07/19/2023   Procedure: LEFT TRANSCAROTID ARTERY REVASCULARIZATION;  Surgeon: Maeola Harman, MD;  Location: [facility];  Service: Vascular;  Laterality: Left;   TRANSURETHRAL RESECTION OF BLADDER TUMOR N/A 08/19/2013   Procedure: TRANSURETHRAL RESECTION OF BLADDER TUMOR (TURBT);  Surgeon: Milford Cage, MD;  Location: [facility];  Service: Urology;  Laterality: N/A;   TRANSURETHRAL RESECTION OF BLADDER TUMOR N/A 07/18/2016   Procedure: TRANSURETHRAL RESECTION OF BLADDER TUMOR (TURBT);  Surgeon: Sebastian Ache, MD;  Location: [facility];  Service: Urology;  Laterality: N/A;   ULTRASOUND GUIDANCE FOR VASCULAR ACCESS Right 07/19/2023   Procedure: ULTRASOUND GUIDANCE FOR VASCULAR ACCESS, RIGHT FEMORAL VEIN;  Surgeon: Maeola Harman, MD;  Location: [facility] OR;  Service: Vascular;  Laterality: Right;   YAG LASER APPLICATION  07/29/2012   Procedure: YAG LASER APPLICATION;  Surgeon: Loraine Leriche T. Nile Riggs, MD;  Location: [facility];  Service: Ophthalmology;  Laterality: Right;    Prior to Admission medications   Medication Sig Start Date End Date Taking?  Authorizing Provider  acetaminophen (TYLENOL) 500 MG tablet Take 1,000 mg by mouth every 6 (six) hours as needed for moderate pain.    [provider]  albuterol (VENTOLIN HFA) 108 (90 Base) MCG/ACT inhaler Inhale 2 puffs into the lungs every 4 (four) hours as needed for shortness of breath or wheezing. 11/22/22   [provider]  allopurinol (ZYLOPRIM) 300 MG tablet Take 300 mg by mouth at bedtime.    [provider]  amLODipine (NORVASC) 10 MG tablet TAKE 1 TABLET BY MOUTH EVERY DAY 07/24/18   Antoine Poche, MD  aspirin 81 MG EC tablet Take 81 mg by mouth at bedtime.    [provider]  celecoxib (  CELEBREX) 200 MG capsule Take 200 mg by mouth 2 (two) times daily. 12/19/22   [provider]  clopidogrel (PLAVIX) 75 MG tablet Take 75 mg by mouth daily.  07/11/16   [provider]  colchicine 0.6 MG tablet Take 0.6 mg by mouth 2 (two) times daily as needed (gout).     [provider]  cyanocobalamin (VITAMIN B12) 1000 MCG tablet Take 1 tablet (1,000 mcg total) by mouth daily. 09/21/22   Willeen Niece, MD  docusate sodium (COLACE) 100 MG capsule Take 100 mg by mouth at bedtime.    [provider]  ferrous sulfate 325 (65 FE) MG EC tablet Take 325 mg by mouth daily.    [provider]  finasteride (PROSCAR) 5 MG tablet Take 5 mg by mouth at bedtime.    [provider]  furosemide (LASIX) 40 MG tablet Take 40 mg by mouth every morning.    [provider]  gabapentin (NEURONTIN) 300 MG capsule Take 300 mg by mouth 2 (two) times daily.    [provider]  HYDROcodone bit-homatropine (HYCODAN) 5-1.5 MG/5ML syrup Take 5 mLs by mouth every 6 (six) hours as needed for cough.    [provider]  Iron-FA-B Cmp-C-Biot-Probiotic (FUSION PLUS) CAPS Take 1 capsule by mouth daily. 12/19/22   [provider]  labetalol (NORMODYNE) 100 MG tablet Take 100 mg by mouth at bedtime.    [provider]  losartan (COZAAR) 100 MG tablet Take 1 tablet (100 mg total) by mouth every evening. Patient taking differently: Take 100 mg by mouth in the morning. 09/22/20   Antoine Poche, MD  metFORMIN (GLUCOPHAGE-XR) 500 MG 24 hr tablet Take 1 tablet (500 mg total) by mouth in the morning and at bedtime. 07/22/23   Rhyne, Ames Coupe, PA-C  NON FORMULARY Pt uses cpap    [provider]  Omega-3-6-9 CAPS Take 1 capsule by mouth 2 (two) times daily.    [provider]  oxyCODONE-acetaminophen (PERCOCET) 5-325 MG tablet Take 1 tablet by mouth every 6 (six) hours as needed for severe pain (pain score 7-10). 07/20/23   Rhyne, Ames Coupe, PA-C  pantoprazole (PROTONIX) 40 MG tablet Take 1 tablet (40 mg total) by mouth daily before breakfast. 07/16/23   Tiffany Kocher, PA-C  polyethylene glycol powder (GLYCOLAX/MIRALAX) powder TAKE 17MG (1CAPFUL) BY MOUTH(MIXED WITH WATER/JUICE) DAILY 05/02/15   Hart Carwin, MD  rosuvastatin (CRESTOR) 10 MG tablet Take 10 mg by mouth every morning.     [provider]  tamsulosin (FLOMAX) 0.4 MG CAPS capsule Take 0.4 mg by mouth daily. 09/05/20   [provider]  Vitamin D, Ergocalciferol, (DRISDOL) 1.25 MG (50000 UNIT) CAPS capsule Take 50,000 Units by mouth every Thursday.    [provider]    Current Facility-Administered Medications  Medication Dose Route Frequency Provider Last Rate Last Admin   0.9 %  sodium chloride infusion (Manually program via Guardrails IV Fluids)   Intravenous Once Terrilee Files, MD       Current Outpatient Medications  Medication Sig Dispense Refill   acetaminophen (TYLENOL) 500 MG tablet Take 1,000 mg by mouth every 6 (six) hours as needed for moderate pain.     albuterol (VENTOLIN HFA) 108 (90 Base) MCG/ACT inhaler Inhale 2 puffs into the lungs every 4 (four) hours as needed for shortness of breath or wheezing.     allopurinol (ZYLOPRIM) 300 MG tablet Take 300 mg by mouth at  bedtime.  amLODipine (NORVASC) 10 MG tablet TAKE 1 TABLET BY MOUTH EVERY DAY 90 tablet 3   aspirin 81 MG EC tablet Take 81 mg by mouth at bedtime.     celecoxib (CELEBREX) 200 MG capsule Take 200 mg by mouth 2 (two) times daily.     clopidogrel (PLAVIX) 75 MG tablet Take 75 mg by mouth daily.   6   colchicine 0.6 MG tablet Take 0.6 mg by mouth 2 (two) times daily as needed (gout).      cyanocobalamin (VITAMIN B12) 1000 MCG tablet Take 1 tablet (1,000 mcg total) by mouth daily. 30 tablet 1   docusate sodium (COLACE) 100 MG capsule Take 100 mg by mouth at bedtime.     ferrous sulfate 325 (65 FE) MG EC tablet Take 325 mg by mouth daily.     finasteride (PROSCAR) 5 MG tablet Take 5 mg by mouth at bedtime.     furosemide (LASIX) 40 MG tablet Take 40 mg by mouth every morning.     gabapentin (NEURONTIN) 300 MG capsule Take 300 mg by mouth 2 (two) times daily.     HYDROcodone bit-homatropine (HYCODAN) 5-1.5 MG/5ML syrup Take 5 mLs by mouth every 6 (six) hours as needed for cough.     Iron-FA-B Cmp-C-Biot-Probiotic (FUSION PLUS) CAPS Take 1 capsule by mouth daily.     labetalol (NORMODYNE) 100 MG tablet Take 100 mg by mouth at bedtime.     losartan (COZAAR) 100 MG tablet Take 1 tablet (100 mg total) by mouth every evening. (Patient taking differently: Take 100 mg by mouth in the morning.) 30 tablet 6   metFORMIN (GLUCOPHAGE-XR) 500 MG 24 hr tablet Take 1 tablet (500 mg total) by mouth in the morning and at bedtime.     NON FORMULARY Pt uses cpap     Omega-3-6-9 CAPS Take 1 capsule by mouth 2 (two) times daily.     oxyCODONE-acetaminophen (PERCOCET) 5-325 MG tablet Take 1 tablet by mouth every 6 (six) hours as needed for severe pain (pain score 7-10). 8 tablet 0   pantoprazole (PROTONIX) 40 MG tablet Take 1 tablet (40 mg total) by mouth daily before breakfast. 90 tablet 3   polyethylene glycol powder (GLYCOLAX/MIRALAX) powder TAKE 17MG (1CAPFUL) BY MOUTH(MIXED WITH WATER/JUICE) DAILY 527 g 0    rosuvastatin (CRESTOR) 10 MG tablet Take 10 mg by mouth every morning.      tamsulosin (FLOMAX) 0.4 MG CAPS capsule Take 0.4 mg by mouth daily.     Vitamin D, Ergocalciferol, (DRISDOL) 1.25 MG (50000 UNIT) CAPS capsule Take 50,000 Units by mouth every Thursday.      Allergies as of 08/09/2023 - Review Complete 08/09/2023  Allergen Reaction Noted   Dilaudid [hydromorphone hcl] Other (See Comments) 04/06/2014   Lyrica [pregabalin] Nausea Only and Other (See Comments)    Toradol [ketorolac tromethamine] Other (See Comments) 02/04/2014   Tramadol  05/02/2022    Family History  Problem Relation Age of Onset   Breast cancer Mother        mets   Diabetes Mother    Heart disease Father        MI   Colon cancer Brother    Cancer Sister        male organs   Diabetes Sister        family hx   Arthritis Other        entire family   Diabetes Brother        x 2    Social History  Socioeconomic History   Marital status: Married    Spouse name: Not on file   Number of children: 2   Years of education: Not on file   Highest education level: Not on file  Occupational History   Occupation: retired//disabled  Tobacco Use   Smoking status: Every Day    Current packs/day: 1.00    Average packs/day: 1 pack/day for 47.0 years (47.0 ttl pk-yrs)    Types: Cigarettes   Smokeless tobacco: Never   Tobacco comments:    pt quits smoking periodically :: started smoking again Jan 2018 1ppd  Vaping Use   Vaping status: Never Used  Substance and Sexual Activity   Alcohol use: No    Alcohol/week: 0.0 standard drinks of alcohol   Drug use: No   Sexual activity: Not Currently  Other Topics Concern   Not on file  Social History Narrative   Not on file   Social Determinants of Health   Financial Resource Strain: Not on file  Food Insecurity: No Food Insecurity (09/21/2022)   Hunger Vital Sign    Worried About Running Out of Food in the Last Year: Never true    Ran Out of Food in the Last  Year: Never true  Transportation Needs: No Transportation Needs (09/21/2022)   PRAPARE - Administrator, Civil Service (Medical): No    Lack of Transportation (Non-Medical): No  Physical Activity: Not on file  Stress: Not on file  Social Connections: Not on file  Intimate Partner Violence: Not At Risk (09/21/2022)   Humiliation, Afraid, Rape, and Kick questionnaire    Fear of Current or Ex-Partner: No    Emotionally Abused: No    Physically Abused: No    Sexually Abused: No   Review of Systems   Gen: Denies any fever, chills, loss of appetite, change in weight or weight loss CV: Denies chest pain, heart palpitations, syncope, edema  Resp: + cough, chest discomfort. Denies shortness of breath with rest, wheezing, coughing up blood, and pleurisy. GI: see HPI GU : Denies urinary burning, blood in urine, urinary frequency, and urinary incontinence. MS: + joint pain. Denies limitation of movement, swelling, cramps, and atrophy.  Derm: Denies rash, itching, dry skin, hives. Psych: Denies depression, anxiety, memory loss, hallucinations, and confusion. Heme: Denies bruising or bleeding Neuro:  Denies any headaches, dizziness, paresthesias, shaking  Physical Exam   Vital Signs in last 24 hours: Pulse Rate:  [77-78] 78 (11/22 1130) Resp:  [19-20] 20 (11/22 1130) BP: (130-145)/(55-59) 138/59 (11/22 1130) SpO2:  [92 %-95 %] 92 % (11/22 1130) Weight:  [81.7 kg] 81.7 kg (11/22 1025)    General:   Alert,  Well-developed, well-nourished, pleasant and cooperative in NAD Head:  Normocephalic and atraumatic. Eyes:  Sclera clear, no icterus.   Conjunctiva pink. Ears:  Normal auditory acuity. Neck:  Supple; no masses. Healing incision noted to left neck.  Lungs:  Clear throughout to auscultation.   No wheezes, crackles, or rhonchi. No acute distress. Heart:  Regular rate and rhythm; systolic murmur present. no clicks, rubs,  or gallops. Abdomen:  Sof. Mild distention noted throughout  and ttp to lower abdomen. No masses, hepatosplenomegaly or hernias noted. Normal bowel sounds, without guarding, and without rebound.   Rectal: deferred   Msk:  Symmetrical without gross deformities. Normal posture. Extremities:  Without clubbing or edema. Neurologic:  Alert and  oriented x4. Skin:  Intact without significant lesions or rashes. Psych:  Alert and cooperative. Normal mood and affect.  Intake/Output from previous day: No intake/output data recorded. Intake/Output this shift: No intake/output data recorded.  Labs/Studies   Recent Labs Recent Labs    08/09/23 1027  WBC 10.5  HGB 6.5*  HCT 20.6*  PLT 181   BMET Recent Labs    08/09/23 1027  NA 137  K 4.1  CL 105  CO2 25  GLUCOSE 144*  BUN 39*  CREATININE 1.18  CALCIUM 8.8*   LFT No results for input(s): "PROT", "ALBUMIN", "AST", "ALT", "ALKPHOS", "BILITOT", "BILIDIR", "IBILI" in the last 72 hours. PT/INR Recent Labs    08/09/23 1142  LABPROT 13.8  INR 1.0   Hepatitis Panel No results for input(s): "HEPBSAG", "HCVAB", "HEPAIGM", "HEPBIGM" in the last 72 hours. C-Diff No results for input(s): "CDIFFTOX" in the last 72 hours.  Radiology/Studies No results found.   Assessment   BOOKER ZIMMERLI is a 77 y.o. year old male with history of bladder cancer s/p TURP in 2004 and 2014, IDA, prostate cancer, heart murmur, arthritis, CAD, OSA on CPAP, PVD, type 2 diabetes, HTN, carotid artery stenosis, and HLD who presented to the ED for evaluation of dizziness, weakness, and possible syncopal episode overnight. GI consulted given symptomatic anemia and heme positive stool.  Symptomatic anemia, heme positive stool: Hemoglobin 6.5 on admission (9.6 two weeks ago and 12.8 four weeks ago).  Recently underwent transcarotid revascularization on 11/1.  Chronically maintained on Celebrex for arthritis and aspirin and Plavix for CAD and carotid artery stenosis.  Hospitalization earlier this year also for anemia and heme  positive stool.  Underwent EGD and colonoscopy during previous hospitalization with evidence of tubular adenomas and H. pylori gastritis s/p treatment and eradication.  Hemoglobin initially stable up until the last several weeks post cardiovascular procedure.    Given stool is melanotic and heme positive, suspect primarily upper GI etiology.  Increasing PPI to twice daily, monitor H&H, and need to consider repeat upper endoscopy for further evaluation of possible gastritis, duodenitis, peptic ulcer disease, gastritis, AVM.  If negative will need to further assess the rest of his upper GI tract with small bowel capsule endoscopy.  Given mild lower abdominal tenderness and distention, will obtain KUB.   Plan / Recommendations   PPI IV BID Transfusion per EDP and hospitalist Trend H/H, transfuse for Hgb <7 NPO Discussed with Dr. Jena Gauss, will do EGD today.  If EGD unrevealing then will need small bowel capsule for further evaluation of IDA, potentially swallow tomorrow after adequate fasting/preparation.  KUB for abdominal distention    08/09/2023, 12:26 PM  Brooke Bonito, MSN, FNP-BC, AGACNP-BC [facility]

## 2023-08-09 NOTE — Progress Notes (Signed)
Patient arrived back from procedure. Upon entering the patient's room post-procedure I observed the patient was eating stew, grilled cheese, and potato chips. These items were provided to him by the family. I advised them the patient's diet is clear liquids and educated them on the acceptable things to bring. Doctor Gwenlyn Perking and doctor [name] notified.

## 2023-08-09 NOTE — Assessment & Plan Note (Signed)
-  Stable overall -Continue adjusted dose of home antihypertensive regimen -Discussed with patient and family member; currently NPO.

## 2023-08-09 NOTE — Progress Notes (Signed)
Patient placed on Auto CPAP with nasal mask and tolerating well at this time.

## 2023-08-10 ENCOUNTER — Observation Stay (HOSPITAL_COMMUNITY): Payer: Medicare HMO

## 2023-08-10 DIAGNOSIS — D649 Anemia, unspecified: Secondary | ICD-10-CM | POA: Diagnosis not present

## 2023-08-10 LAB — BASIC METABOLIC PANEL
Anion gap: 10 (ref 5–15)
BUN: 27 mg/dL — ABNORMAL HIGH (ref 8–23)
CO2: 26 mmol/L (ref 22–32)
Calcium: 8.9 mg/dL (ref 8.9–10.3)
Chloride: 107 mmol/L (ref 98–111)
Creatinine, Ser: 1.11 mg/dL (ref 0.61–1.24)
GFR, Estimated: >60 mL/min (ref >60)
Glucose, Bld: 96 mg/dL (ref 70–99)
Potassium: 4.4 mmol/L (ref 3.5–5.1)
Sodium: 143 mmol/L (ref 135–145)

## 2023-08-10 LAB — TYPE AND SCREEN
ABO/RH(D): O POS
Antibody Screen: NEGATIVE
Unit division: 0

## 2023-08-10 LAB — CBC
HCT: 24.1 % — ABNORMAL LOW (ref 39.0–52.0)
Hemoglobin: 7.7 g/dL — ABNORMAL LOW (ref 13.0–17.0)
MCH: 33.2 pg (ref 26.0–34.0)
MCHC: 32 g/dL (ref 30.0–36.0)
MCV: 103.9 fL — ABNORMAL HIGH (ref 80.0–100.0)
Platelets: 174 10*3/uL (ref 150–400)
RBC: 2.32 MIL/uL — ABNORMAL LOW (ref 4.22–5.81)
RDW: 20.1 % — ABNORMAL HIGH (ref 11.5–15.5)
WBC: 10.2 10*3/uL (ref 4.0–10.5)
nRBC: 0 % (ref 0.0–0.2)

## 2023-08-10 LAB — BPAM RBC
Blood Product Expiration Date: 202412132359
ISSUE DATE / TIME: 202411221218
Unit Type and Rh: 5100

## 2023-08-10 LAB — GLUCOSE, CAPILLARY
Glucose-Capillary: 118 mg/dL — ABNORMAL HIGH (ref 70–99)
Glucose-Capillary: 105 mg/dL — ABNORMAL HIGH (ref 70–99)

## 2023-08-10 MED ORDER — HYDRALAZINE HCL 20 MG/ML IJ SOLN: 10.0000 mg | Freq: Four times a day (QID) | INTRAMUSCULAR | Status: DC | PRN

## 2023-08-10 MED ORDER — AMLODIPINE BESYLATE 5 MG PO TABS
10.0000 mg | ORAL_TABLET | Freq: Every day | ORAL | Status: DC
  Administered 2023-08-10: 10 mg via ORAL
  Filled 2023-08-10: qty 2

## 2023-08-10 MED ORDER — ASPIRIN 81 MG PO TBEC: 81.0000 mg | DELAYED_RELEASE_TABLET | Freq: Every day | ORAL | 12 refills | Status: AC

## 2023-08-10 MED ORDER — SUCRALFATE 1 GM/10ML PO SUSP: 1.0000 g | Freq: Four times a day (QID) | ORAL | 0 refills | Status: DC

## 2023-08-10 MED ORDER — PANTOPRAZOLE SODIUM 40 MG PO TBEC: 40.0000 mg | DELAYED_RELEASE_TABLET | Freq: Two times a day (BID) | ORAL | 3 refills | Status: DC

## 2023-08-10 MED ORDER — NICOTINE 14 MG/24HR TD PT24: 14.0000 mg | MEDICATED_PATCH | Freq: Every day | TRANSDERMAL | 0 refills | Status: DC

## 2023-08-10 NOTE — Progress Notes (Signed)
   08/10/23 1134  TOC Brief Assessment  Insurance and Status Reviewed  Patient has primary care physician Yes  Home environment has been reviewed From home  Prior level of function: Independent  Prior/Current Home Services No current home services  Social Determinants of Health Reivew SDOH reviewed no interventions necessary  Readmission risk has been reviewed Yes  Transition of care needs no transition of care needs at this time   CSW completed obs at bedside. Pt DC ready.  No further TOC needs.

## 2023-08-10 NOTE — Discharge Summary (Incomplete)
[name], is a 77 y.o. male  DOB 1945-10-24  MRN [id number].  Admission date:  08/09/2023  Admitting Physician  Vassie Loll, MD  Discharge Date:  08/10/2023   Primary MD  Benita Stabile, MD  Recommendations for primary care physician for things to follow:  1)Avoid ibuprofen/Advil/Aleve/Motrin/Goody Powders/Naproxen/BC powders/Meloxicam/Diclofenac/Indomethacin and other Nonsteroidal anti-inflammatory medications as these will make you more likely to bleed and can cause stomach ulcers, can also cause Kidney problems.   2) complete abstinence from tobacco advised--- okay to use over-the-counter nicotine patch to help you quit smoking  3) repeat CBC and BMP blood test within a week advised  4)Please Follow up with Gastroenterologist Dr. [place] Nation discuss possible capsule endoscopy test- - address [address], [place] [address],,DGLOV Number [phone]     Admission Diagnosis  Symptomatic anemia [D64.9]   Discharge Diagnosis  Symptomatic anemia [D64.9]    Principal Problem:   Symptomatic anemia Active Problems:   Hyperlipidemia   Chronic gouty arthropathy   HTN (hypertension)   Carotid artery disease without cerebral infarction (HCC)   OSA (obstructive sleep apnea)   CAD (coronary artery disease)   Controlled type 2 diabetes mellitus without complication, without long-term current use of insulin (HCC)   BPH (benign prostatic hyperplasia)      Past Medical History:  Diagnosis Date   Anemia    Iron Deficiency   Bilateral renal cysts    Bladder cancer (HCC) UROLOGIST-  DR [facility]   RECURRENT 11/ 2017 --  hx turbt in 2004 and 08-19-2013   BPH without obstruction/lower urinary tract symptoms    Cervical fusion syndrome    LEFT ARM NUMBNESS--  CONTROLLED W/ GABAPENTIN   Complication of anesthesia    Coronary artery disease    CARDIOLOGIST-- DR Wyline Mood Lorelle Formosa)    DDD (degenerative disc disease), cervical    DDD (degenerative disc disease), lumbar    Diverticulosis of sigmoid colon    Heart murmur    slight murmur per patient   History of acute gouty arthritis    History of adenomatous polyp of colon    2001;  2012- non-malignant leiomyoma;  04-07-2014  tubualr adenoma and hyperplastic polyp's   History of prostate cancer UROLOGIST-  DR [facility]   S/P  RADIOACTIVE SEED IMPLANTS 2004   HOH (hard of hearing)    no hearing aids   Hyperlipidemia    Hypertension    OA (osteoarthritis)    Occlusion and stenosis of carotid artery without mention of cerebral infarction CARDIOLOGIST  -- DR  Christiane Ha BRANCH   LAST DUPLEX  11-23-2015  RICA  (DUPLEX DOPPLER 01-20-2016 RICA 60-79% PROXIMAL) /   LICA <50% (HAD CONSULT W/ DR Imogene Burn , VASCULAR SURGEON 01-20-2016)   OSA on CPAP    BiPAP   Peripheral vascular disease (HCC)    Carotid Artery Stenosis   PONV (postoperative nausea and vomiting)    Prostate cancer (HCC)    per patient "years ago"- had radiation implant   Spondylosis, cervical  Type 2 diabetes mellitus (HCC)    Wears glasses     Past Surgical History:  Procedure Laterality Date   ANTERIOR REMOVAL CAGE AND PLATE X9-J4/ CORPECTOMY C7 (FX)/  ALLOGRAFT AND FUSION C3 -- T1/  POSTERIOR DECOMPRESSION BILATERAL LAMINECTOMY C4 -- 6 & PARTIAL C3/  POSTEROLATERAL ARTHRODESIS C3 - T1  06/05/2005   APPENDECTOMY  1990'S   BIOPSY  09/21/2022   Procedure: BIOPSY;  Surgeon: Corbin Ade, MD;  [facility];  Service: Endoscopy;;   CATARACT EXTRACTION W/ INTRAOCULAR LENS  IMPLANT, BILATERAL  2013   CERVICAL FUSION  05/11/2005   C3  --  C7/  due to post op quadriparesis same day s/p  anterior C 4,5,6, colpectomy, decompression, removal epidural hematoma, foraminnotomy, cage and plate   COLONOSCOPY  last one 04-07-2014   COLONOSCOPY WITH PROPOFOL N/A 09/21/2022   Procedure: COLONOSCOPY WITH PROPOFOL;  Surgeon: Corbin Ade, MD;  [facility];  Service: Endoscopy;  Laterality: N/A;   CYSTOSCOPY W/ RETROGRADES Bilateral 08/19/2013   Procedure: CYSTOSCOPY WITH RETROGRADE PYELOGRAM;  Surgeon: Milford Cage, MD;  Location: [facility];  Service: Urology;  Laterality: Bilateral;   CYSTOSCOPY W/ RETROGRADES Bilateral 07/18/2016   Procedure: CYSTOSCOPY WITH RETROGRADE PYELOGRAM;  Surgeon: Sebastian Ache, MD;  Location: [facility];  Service: Urology;  Laterality: Bilateral;   ENDOSCOPIC REPAIR CSF LEAK VIA NASAL PASSAGE  2011   ESOPHAGOGASTRODUODENOSCOPY (EGD) WITH PROPOFOL N/A 09/21/2022   Procedure: ESOPHAGOGASTRODUODENOSCOPY (EGD) WITH PROPOFOL;  Surgeon: Corbin Ade, MD;  [facility];  Service: Endoscopy;  Laterality: N/A;   HEMOSTASIS CLIP PLACEMENT  09/21/2022   Procedure: HEMOSTASIS CLIP PLACEMENT;  Surgeon: Corbin Ade, MD;  [facility];  Service: Endoscopy;;   INTRAOPERATIVE ARTERIOGRAM  CATH LAB  01-05-2009  DR Jacinto Halim   RICA   ACUTE ANGLE 80-85% STENOSIS   KNEE ARTHROSCOPY Left 07/15/2017   Procedure: ARTHROSCOPY LEFT  KNEE AND DEBRIDEMENT, medial meniscal tear and chondromalaita;  Surgeon: Durene Romans, MD;  Location: [facility];  Service: Orthopedics;  Laterality: Left;  60 MINS   LUMBAR FUSION  03-26-2012   L3 --  L5   POLYPECTOMY  09/21/2022   Procedure: POLYPECTOMY;  Surgeon: Corbin Ade, MD;  [facility];  Service: Endoscopy;;   RADIOACTIVE PROSTATE SEED IMPLANTS  08-19-2003   SEPTOPLASTY  1998   TOTAL KNEE ARTHROPLASTY Left 02/25/2018   Procedure: LEFT TOTAL KNEE ARTHROPLASTY;  Surgeon: Durene Romans, MD;  Location: [facility];  Service: Orthopedics;  Laterality: Left;  70 mins   TRANSCAROTID ARTERY REVASCULARIZATION  Right 05/11/2022   Procedure: Right Transcarotid Artery Revascularization;  Surgeon: Maeola Harman, MD;  Location: [facility] OR;  Service: Vascular;  Laterality: Right;   TRANSCAROTID ARTERY REVASCULARIZATION  Left 07/19/2023    Procedure: LEFT TRANSCAROTID ARTERY REVASCULARIZATION;  Surgeon: Maeola Harman, MD;  Location: [facility];  Service: Vascular;  Laterality: Left;   TRANSURETHRAL RESECTION OF BLADDER TUMOR N/A 08/19/2013   Procedure: TRANSURETHRAL RESECTION OF BLADDER TUMOR (TURBT);  Surgeon: Milford Cage, MD;  Location: [facility];  Service: Urology;  Laterality: N/A;   TRANSURETHRAL RESECTION OF BLADDER TUMOR N/A 07/18/2016   Procedure: TRANSURETHRAL RESECTION OF BLADDER TUMOR (TURBT);  Surgeon: Sebastian Ache, MD;  [facility];  Service: Urology;  Laterality: N/A;   ULTRASOUND GUIDANCE FOR VASCULAR ACCESS Right 07/19/2023   Procedure: ULTRASOUND GUIDANCE FOR VASCULAR ACCESS, RIGHT FEMORAL VEIN;  Surgeon: Randie Heinz,  Dennard Schaumann, MD;  Location: [facility] OR;  Service: Vascular;  Laterality: Right;   YAG LASER APPLICATION  07/29/2012   Procedure: YAG LASER APPLICATION;  Surgeon: Loraine Leriche T. Nile Riggs, MD;  Location: [facility];  Service: Ophthalmology;  Laterality: Right;     HPI  from the history and physical done on the day of admission:     HPI: ASIEL KAMPFER is a 77 y.o. male with medical history significant of hypertension, hyperlipidemia, type 2 diabetes mellitus, coronary artery disease, BPH, history of prostate cancer, carotid artery stenosis (status post endarterectomy), coronary artery disease, obstructive sleep apnea and gouty arthritis; who presented to the hospital secondary to generalized weakness, shortness of breath with activity, dizziness when changing position and syncope.   Patient reports symptom has been present for the last 3 days or so and worsening.  No prodromic or alarming complaints.   Workup in the ED demonstrating hemoglobin of 6.5; positive fecal occult blood test, negative COVID PCR, no acute ischemic changes on EKG and negative troponin.  Normal WBCs and urinalysis no suggesting acute infection..   Patient reports no fever, no nausea, no  vomiting, no chest pain, no shortness of breath or any other complaints.   Of note, patient chronically on celecoxib for treatment of gouty arthritis and recently started on aspirin and Plavix after endarterectomy.   Patient type and screen, 1 unit of PRBCs has been ordered and GI service consulted.  IV PPI twice a day initiated and TR[facility] contacted to place patient in the hospital for further evaluation and management.   Review of Systems: As mentioned in the history of present illness. All other systems reviewed and are negative.    Hospital Course:   Assessment and Plan: 1) acute on chronic symptomatic anemia due to GI bleed -Hgb up to 7.7 from 6.5 after transfusion of 2 initial PRBC this admission Underwent EGD on 08/09/2023--Normal esophagus, - Normal stomach, Duodenal AVMs x 2; ablated-- GI team recommends outpatient capsule endoscopy -Continue Protonix and Carafate  2)CAD/PAD--history of prior carotid endarterectomy --- -given GI bleed as above patient should discuss with his cardiologist if he needs DAPT patient is currently on DAPT,  -Continue Crestor for secondary prevention  3)DM2-A1c 6.1 reflecting excellent diabetic control PTA -Continue metformin  4)Tobacco Abuse--Smoking cessation counseling for 4 minutes today,   nicotine patch I have discussed tobacco cessation with the patient.  I have counseled the patient regarding the negative impacts of continued tobacco use including but not limited to lung cancer, COPD, and cardiovascular disease.  I have discussed alternatives to tobacco and modalities that may help facilitate tobacco cessation including but not limited to biofeedback, hypnosis, and medications.  Total time spent with tobacco counseling was 4 minutes.  5)HTN--c./n PTA meds including amlodipine and labetalol  Discharge Condition: stable  Follow UP   Follow-up Information     Marguerita Merles, Reuel Boom, MD. Schedule an appointment as soon as possible for a  visit in 1 week(s).   Specialty: Gastroenterology Why: Repeat CBC blood test and to discuss possible capsule endoscopy Contact information: [address] [place] [address] [phone]                  Consults obtained - Gi  Diet and Activity recommendation:  As advised  Discharge Instructions    Discharge Instructions     Call MD for:  difficulty breathing, headache or visual disturbances   Complete by: As directed    Call MD for:  persistant  dizziness or light-headedness   Complete by: As directed    Call MD for:  persistant nausea and vomiting   Complete by: As directed    Diet - low sodium heart healthy   Complete by: As directed    Discharge instructions   Complete by: As directed    1)Avoid ibuprofen/Advil/Aleve/Motrin/Goody Powders/Naproxen/BC powders/Meloxicam/Diclofenac/Indomethacin and other Nonsteroidal anti-inflammatory medications as these will make you more likely to bleed and can cause stomach ulcers, can also cause Kidney problems.   2) complete abstinence from tobacco advised--- okay to use over-the-counter nicotine patch to help you quit smoking  3) repeat CBC and BMP blood test within a week advised  4)Please Follow up with Gastroenterologist Dr. [place] Nation discuss possible capsule endoscopy test- - address [address], [address], [place] [address],,OZHYQ Number [phone]   Increase activity slowly   Complete by: As directed          Discharge Medications     Allergies as of 08/10/2023       Reactions   Dilaudid [hydromorphone Hcl] Other (See Comments)   "acts a little off"   Lyrica [pregabalin] Nausea Only, Other (See Comments)   Make me feel "bad"   Toradol [ketorolac Tromethamine] Other (See Comments)   Caused hallucination - states not sure / avoids per his MD   Tramadol Other (See Comments)   Couldn't sleep        Medication List     STOP taking these medications     amoxicillin-clavulanate 875-125 MG tablet Commonly known as: AUGMENTIN   celecoxib 200 MG capsule Commonly known as: CELEBREX   HYDROcodone bit-homatropine 5-1.5 MG/5ML syrup Commonly known as: HYCODAN   predniSONE 10 MG tablet Commonly known as: DELTASONE       TAKE these medications    acetaminophen 500 MG tablet Commonly known as: TYLENOL Take 1,000 mg by mouth every 6 (six) hours as needed for moderate pain.   albuterol 108 (90 Base) MCG/ACT inhaler Commonly known as: VENTOLIN HFA Inhale 2 puffs into the lungs every 4 (four) hours as needed for shortness of breath or wheezing.   allopurinol 300 MG tablet Commonly known as: ZYLOPRIM Take 300 mg by mouth at bedtime.   amLODipine 10 MG tablet Commonly known as: NORVASC TAKE 1 TABLET BY MOUTH EVERY DAY   aspirin EC 81 MG tablet Take 1 tablet (81 mg total) by mouth daily with breakfast. Start taking on: August 12, 2023 What changed:  when to take this These instructions start on August 12, 2023. If you are unsure what to do until then, ask your doctor or other care provider.   Breztri Aerosphere 160-9-4.8 MCG/ACT Aero Generic drug: Budeson-Glycopyrrol-Formoterol Inhale 1 puff into the lungs daily.   clopidogrel 75 MG tablet Commonly known as: PLAVIX Take 75 mg by mouth daily.   colchicine 0.6 MG tablet Take 0.6 mg by mouth 2 (two) times daily as needed (gout).   cyanocobalamin 1000 MCG tablet Commonly known as: VITAMIN B12 Take 1 tablet (1,000 mcg total) by mouth daily.   docusate sodium 100 MG capsule Commonly known as: COLACE Take 100 mg by mouth at bedtime.   ferrous sulfate 325 (65 FE) MG EC tablet Take 325 mg by mouth daily.   finasteride 5 MG tablet Commonly known as: PROSCAR Take 5 mg by mouth at bedtime.   furosemide 40 MG tablet Commonly known as: LASIX Take 40 mg by mouth every morning.   Fusion Plus Caps Take 1 capsule by mouth daily.  gabapentin 300 MG capsule Commonly  known as: NEURONTIN Take 300 mg by mouth 2 (two) times daily.   labetalol 100 MG tablet Commonly known as: NORMODYNE Take 100 mg by mouth at bedtime.   losartan 100 MG tablet Commonly known as: COZAAR Take 1 tablet (100 mg total) by mouth every evening. What changed: when to take this   metFORMIN 500 MG 24 hr tablet Commonly known as: GLUCOPHAGE-XR Take 1 tablet (500 mg total) by mouth in the morning and at bedtime.   nicotine 14 mg/24hr patch Commonly known as: NICODERM CQ - dosed in mg/24 hours Place 1 patch (14 mg total) onto the skin daily. Start taking on: August 11, 2023   NON FORMULARY Pt uses cpap   Omega-3-6-9 Caps Take 1 capsule by mouth 2 (two) times daily.   pantoprazole 40 MG tablet Commonly known as: PROTONIX Take 1 tablet (40 mg total) by mouth 2 (two) times daily. What changed: when to take this   polyethylene glycol powder 17 GM/SCOOP powder Commonly known as: GLYCOLAX/MIRALAX TAKE 17MG (1CAPFUL) BY MOUTH(MIXED WITH WATER/JUICE) DAILY   rosuvastatin 10 MG tablet Commonly known as: CRESTOR Take 10 mg by mouth every morning.   sucralfate 1 GM/10ML suspension Commonly known as: CARAFATE Take 10 mLs (1 g total) by mouth 4 (four) times daily.   tamsulosin 0.4 MG Caps capsule Commonly known as: FLOMAX Take 0.4 mg by mouth daily.   Vitamin D (Ergocalciferol) 1.25 MG (50000 UNIT) Caps capsule Commonly known as: DRISDOL Take 50,000 Units by mouth every Thursday.        Major procedures and Radiology Reports - PLEASE review detailed and final reports for all details, in brief -   DG Abd 1 View  Result Date: 08/10/2023 CLINICAL DATA:  Bloating and abdominal distension. EXAM: ABDOMEN - 1 VIEW COMPARISON:  None Available. FINDINGS: Bowel gas pattern appears nonspecific. There is no pathologically dilated loops of large or small bowel. Increased small-bowel gas noted, nonspecific. Mild stool burden noted within the colon. Postoperative changes  identified within the lumbar spine. Imaged portions of the lung bases are clear. Seed implants noted within the prostate gland. IMPRESSION: 1. Nonspecific bowel gas pattern.  No signs of bowel obstruction. 2. Mild stool burden within the colon. Electronically Signed   By: Signa Kell M.D.   On: 08/10/2023 09:42   DG C-Arm 1-60 Min  Result Date: 07/19/2023 CLINICAL DATA:  Left cervical ICA web EXAM: DG C-ARM 1-60 MIN COMPARISON:  CTA 04/22/2023 FINDINGS: A series of C-arm runs document cannulation of the common carotid artery and catheterization of the ICA with proximal stent deployment. IMPRESSION: Left ICA stent deployment. Electronically Signed   By: Corlis Leak M.D.   On: 07/19/2023 17:00   DG C-Arm 1-60 Min-No Report  Result Date: 07/19/2023 Fluoroscopy was utilized by the requesting physician.  No radiographic interpretation.    Micro Results   Recent Results (from the past 240 hour(s))  SARS Coronavirus 2 by RT PCR (hospital order, performed in [facility] hospital lab) *cepheid single result test* Anterior Nasal Swab     Status: None   Collection Time: 08/09/23 11:16 AM   Specimen: Anterior Nasal Swab  Result Value Ref Range Status   SARS Coronavirus 2 by RT PCR NEGATIVE NEGATIVE Final    Comment: Performed at [facility], [address] [place], [place] [address]    Today   Subjective    Amarian Tibor today has no new complaints .  No nausea no vomiting -Eating  and drinking well  no chest pains or palpitations          Patient has been seen and examined prior to discharge   Objective   Blood pressure 99/66, pulse 82, temperature 98.1 F (36.7 C), temperature source Oral, resp. rate 18, height 5\' 7"  (1.702 m), weight 81.7 kg, SpO2 96%.   Intake/Output Summary (Last 24 hours) at 08/10/2023 1406 Last data filed at 08/10/2023 1300 Gross per 24 hour  Intake 1706.67 ml  Output --  Net 1706.67 ml    Exam Gen:- Awake Alert, no acute distress  HEENT:- [place].AT, No  sclera icterus Neck-Supple Neck,No JVD,.  Lungs-  CTAB , good air movement bilaterally CV- S1, S2 normal, regular Abd-  +ve B.Sounds, Abd Soft, No tenderness,    Extremity/Skin:- No  edema,   good pulses Psych-affect is appropriate, oriented x3 Neuro-no new focal deficits, no tremors    Data Review   CBC w Diff:  Lab Results  Component Value Date   WBC 10.2 08/10/2023   HGB 7.7 (L) 08/10/2023   HCT 24.1 (L) 08/10/2023   PLT 174 08/10/2023   LYMPHOPCT 17 09/20/2022   MONOPCT 10.9 07/10/2023   EOSPCT 5.9 07/10/2023   BASOPCT 0.8 07/10/2023    CMP:  Lab Results  Component Value Date   NA 143 08/10/2023   K 4.4 08/10/2023   CL 107 08/10/2023   CO2 26 08/10/2023   BUN 27 (H) 08/10/2023   CREATININE 1.11 08/10/2023   CREATININE 1.04 01/14/2023   PROT 7.2 07/11/2023   ALBUMIN 4.2 07/11/2023   BILITOT 0.6 07/11/2023   ALKPHOS 55 07/11/2023   AST 31 07/11/2023   ALT 35 07/11/2023  .  Total Discharge time is about 33 minutes  Shon Hale M.D on 08/10/2023 at 2:06 PM  Go to [email or web address] -  for contact info  [facility] - Office  [phone]

## 2023-08-10 NOTE — Discharge Instructions (Signed)
1)Avoid ibuprofen/Advil/Aleve/Motrin/Goody Powders/Naproxen/BC powders/Meloxicam/Diclofenac/Indomethacin and other Nonsteroidal anti-inflammatory medications as these will make you more likely to bleed and can cause stomach ulcers, can also cause Kidney problems.   2) complete abstinence from tobacco advised--- okay to use over-the-counter nicotine patch to help you quit smoking  3) repeat CBC and BMP blood test within a week advised  4)Please Follow up with Gastroenterologist Dr. [place] Nation discuss possible capsule endoscopy test- - address [address], [place] [address],,XBJYN Number [phone]

## 2023-08-10 NOTE — Care Management Obs Status (Signed)
MEDICARE OBSERVATION STATUS NOTIFICATION   Patient Details  Name: [name] MRN: [id number] Date of Birth: May 01, 1946   Medicare Observation Status Notification Given:  Yes    Quamaine Webb Marsh Dolly, LCSW 08/10/2023, 11:20 AM

## 2023-08-11 NOTE — Anesthesia Postprocedure Evaluation (Signed)
Anesthesia Post Note  Patient: [name]  Procedure(s) Performed: ESOPHAGOGASTRODUODENOSCOPY (EGD) WITH PROPOFOL HOT HEMOSTASIS (ARGON PLASMA COAGULATION/BICAP)  Patient location during evaluation: Phase II Anesthesia Type: General Level of consciousness: awake Pain management: pain level controlled Vital Signs Assessment: post-procedure vital signs reviewed and stable Respiratory status: spontaneous breathing and respiratory function stable Cardiovascular status: blood pressure returned to baseline and stable Postop Assessment: no headache and no apparent nausea or vomiting Anesthetic complications: no Comments: Late entry   No notable events documented.   Last Vitals:  Vitals:   08/10/23 1304 08/10/23 1347  BP: (!) 93/46 99/66  Pulse: 82   Resp:    Temp: 36.7 C   SpO2: 96%     Last Pain:  Vitals:   08/10/23 1330  TempSrc:   PainSc: 0-No pain                 Windell Norfolk

## 2023-08-12 ENCOUNTER — Other Ambulatory Visit: Payer: Self-pay | Admitting: *Deleted

## 2023-08-12 DIAGNOSIS — I6522 Occlusion and stenosis of left carotid artery: Secondary | ICD-10-CM

## 2023-08-12 DIAGNOSIS — I6523 Occlusion and stenosis of bilateral carotid arteries: Secondary | ICD-10-CM

## 2023-08-14 ENCOUNTER — Encounter (HOSPITAL_COMMUNITY): Payer: Self-pay | Admitting: Internal Medicine

## 2023-08-19 ENCOUNTER — Encounter: Payer: Self-pay | Admitting: Gastroenterology

## 2023-08-19 ENCOUNTER — Ambulatory Visit: Payer: Medicare HMO | Admitting: Gastroenterology

## 2023-08-19 VITALS — BP 179/65 | HR 86 | Temp 98.0°F | Ht 67.0 in | Wt 179.6 lb

## 2023-08-19 DIAGNOSIS — E538 Deficiency of other specified B group vitamins: Secondary | ICD-10-CM | POA: Diagnosis not present

## 2023-08-19 DIAGNOSIS — D500 Iron deficiency anemia secondary to blood loss (chronic): Secondary | ICD-10-CM

## 2023-08-19 DIAGNOSIS — D509 Iron deficiency anemia, unspecified: Secondary | ICD-10-CM | POA: Diagnosis not present

## 2023-08-19 DIAGNOSIS — Z8719 Personal history of other diseases of the digestive system: Secondary | ICD-10-CM | POA: Diagnosis not present

## 2023-08-19 DIAGNOSIS — Z8619 Personal history of other infectious and parasitic diseases: Secondary | ICD-10-CM | POA: Diagnosis not present

## 2023-08-19 NOTE — Patient Instructions (Signed)
Complete labs at [facility] tomorrow. We will contact Kristy to get up to date medication list.

## 2023-08-19 NOTE — Progress Notes (Signed)
GI Office Note    Referring Provider: Benita Stabile, MD Primary Care Physician:  Benita Stabile, MD  Primary Gastroenterologist: Hennie Duos. Marletta Lor, DO   Chief Complaint   Chief Complaint  Patient presents with   Hospitalization Follow-up    History of Present Illness   [name] is a 77 y.o. male presenting today for hospital follow-up.  Recent admission end of last month presenting after syncopal episode.   History of profound anemia (hemoglobin of 7) requiring hospitalization in January 2024, stool Hemoccult positive at that time. Found to have ferritin of 11, B12 156. Required blood transfusion. At discharge his hemoglobin was 7.8. Chronically on Celebrex, cannot function without it. Also on daily aspirin 81 mg, Plavix 75 mg daily. Completed EGD/TCS as outlined below. Confirmed h.pylori eradication 12/2022 via stool antigen.    When presented 08/09/23, his Hemoglobin 6.5 down from 9.6 on November 2. MCV 105.1.  Received 1 unit of packed red blood cells.  Hemoglobin up to 7.7 on November 23. EGD completed, two small bowel AVMS treated.  States he is worse off then when he went to ED. Legs feel weak, has some mild discomfort in this chest and DOE. No abdominal pain. BM regular. No obvious blood in stool. Good appetite. No heartburn or vomiting. No weight loss.   He is not sure of his medications. Daughter in law helps him with meds.    Prior endoscopic workup: Colonoscopy 09/21/2022: - Diverticulosis in the sigmoid colon, in the descending colon and at the splenic flexure. - One 10 mm polyp in the cecum, removed with a cold snare. Resected and retrieved.  - The examination was otherwise normal on direct and retroflexion views. Cannot rule out a diverticular bleed here. -Pathology revealed tubular adenomas -Recommend repeat in 3 years   EGD 09/21/2022: - Normal esophagus.  - Abnormal gastric mucosa of uncertain significance (Reticulated, mottled appearance of the gastric  mucosa diffusely), status post biopsy.  - Normal duodenal bulb and second portion of the duodenum. -Biopsies revealed H. pylori gastritis -Patient treated with Pylera   Colonoscopy July 2015: -2 sessile polyps ranging 3 to 5 mm in the cecum and rectum (rectal polyp removed but not cecal polyp given on Plavix) -Moderate to severe diverticulosis in the sigmoid colon -Complete resolution of inflammatory changes seen on CT scan -Cecal biopsy revealed to be a tubular adenoma, rectal polyp revealed to be hyperplastic   EGD November 2024 -Duodenal AVMs x 2 ablated  Medications   Current Outpatient Medications  Medication Sig Dispense Refill   acetaminophen (TYLENOL) 500 MG tablet Take 1,000 mg by mouth every 6 (six) hours as needed for moderate pain.     albuterol (VENTOLIN HFA) 108 (90 Base) MCG/ACT inhaler Inhale 2 puffs into the lungs every 4 (four) hours as needed for shortness of breath or wheezing.     allopurinol (ZYLOPRIM) 300 MG tablet Take 300 mg by mouth at bedtime.     amLODipine (NORVASC) 10 MG tablet TAKE 1 TABLET BY MOUTH EVERY DAY 90 tablet 3   aspirin EC 81 MG tablet Take 1 tablet (81 mg total) by mouth daily with breakfast. 30 tablet 12   Budeson-Glycopyrrol-Formoterol (BREZTRI AEROSPHERE) 160-9-4.8 MCG/ACT AERO Inhale 1 puff into the lungs daily.     clopidogrel (PLAVIX) 75 MG tablet Take 75 mg by mouth daily.   6   colchicine 0.6 MG tablet Take 0.6 mg by mouth 2 (two) times daily as needed (gout).  cyanocobalamin (VITAMIN B12) 1000 MCG tablet Take 1 tablet (1,000 mcg total) by mouth daily. 30 tablet 1   docusate sodium (COLACE) 100 MG capsule Take 100 mg by mouth at bedtime.     ferrous sulfate 325 (65 FE) MG EC tablet Take 325 mg by mouth daily.     finasteride (PROSCAR) 5 MG tablet Take 5 mg by mouth at bedtime.     furosemide (LASIX) 40 MG tablet Take 40 mg by mouth every morning.     gabapentin (NEURONTIN) 300 MG capsule Take 300 mg by mouth 2 (two) times daily.      Iron-FA-B Cmp-C-Biot-Probiotic (FUSION PLUS) CAPS Take 1 capsule by mouth daily.     labetalol (NORMODYNE) 100 MG tablet Take 100 mg by mouth at bedtime.     losartan (COZAAR) 100 MG tablet Take 1 tablet (100 mg total) by mouth every evening. (Patient taking differently: Take 100 mg by mouth in the morning.) 30 tablet 6   metFORMIN (GLUCOPHAGE-XR) 500 MG 24 hr tablet Take 1 tablet (500 mg total) by mouth in the morning and at bedtime.     nicotine (NICODERM CQ - DOSED IN MG/24 HOURS) 14 mg/24hr patch Place 1 patch (14 mg total) onto the skin daily. 28 patch 0   NON FORMULARY Pt uses cpap     Omega-3-6-9 CAPS Take 1 capsule by mouth 2 (two) times daily.     pantoprazole (PROTONIX) 40 MG tablet Take 1 tablet (40 mg total) by mouth 2 (two) times daily. 180 tablet 3   polyethylene glycol powder (GLYCOLAX/MIRALAX) powder TAKE 17MG (1CAPFUL) BY MOUTH(MIXED WITH WATER/JUICE) DAILY 527 g 0   rosuvastatin (CRESTOR) 10 MG tablet Take 10 mg by mouth every morning.      sucralfate (CARAFATE) 1 GM/10ML suspension Take 10 mLs (1 g total) by mouth 4 (four) times daily. 420 mL 0   tamsulosin (FLOMAX) 0.4 MG CAPS capsule Take 0.4 mg by mouth daily.     Vitamin D, Ergocalciferol, (DRISDOL) 1.25 MG (50000 UNIT) CAPS capsule Take 50,000 Units by mouth every Thursday.     No current facility-administered medications for this visit.    Allergies   Allergies as of 08/19/2023 - Review Complete 08/19/2023  Allergen Reaction Noted   Dilaudid [hydromorphone hcl] Other (See Comments) 04/06/2014   Lyrica [pregabalin] Nausea Only and Other (See Comments)    Toradol [ketorolac tromethamine] Other (See Comments) 02/04/2014   Tramadol Other (See Comments) 05/02/2022      Review of Systems   General: Negative for anorexia, weight loss, fever, chills, fatigue, +weakness. ENT: Negative for hoarseness, difficulty swallowing , nasal congestion. CV: Negative for chest pain, angina, palpitations, +dyspnea on exertion,  peripheral edema.  Respiratory: Negative for dyspnea at rest, dyspnea on exertion, cough, sputum, wheezing.  GI: See history of present illness. GU:  Negative for dysuria, hematuria, urinary incontinence, urinary frequency, nocturnal urination.  Endo: Negative for unusual weight change.     Physical Exam   BP (!) 144/55 (BP Location: Right Arm, Patient Position: Sitting, Cuff Size: Normal)   Pulse (!) 101   Temp 98 F (36.7 C) (Oral)   Ht 5\' 7"  (1.702 m)   Wt 179 lb 9.6 oz (81.5 kg)   SpO2 96%   BMI 28.13 kg/m    General: Well-nourished, well-developed in no acute distress.  Eyes: No icterus. Mouth: Oropharyngeal mucosa moist and pink   Lungs: Clear to auscultation bilaterally.  Heart: Regular rate and rhythm, no murmurs rubs or gallops.  Abdomen: Bowel  sounds are normal, nontender, nondistended, no hepatosplenomegaly or masses,  no abdominal bruits or hernia , no rebound or guarding.  Rectal: not performed  Extremities: No lower extremity edema. No clubbing or deformities. Neuro: Alert and oriented x 4   Skin: Warm and dry, no jaundice.   Psych: Alert and cooperative, normal mood and affect.  Labs   Lab Results  Component Value Date   NA 143 08/10/2023   CL 107 08/10/2023   K 4.4 08/10/2023   CO2 26 08/10/2023   BUN 27 (H) 08/10/2023   CREATININE 1.11 08/10/2023   GFRNONAA >60 08/10/2023   CALCIUM 8.9 08/10/2023   ALBUMIN 4.2 07/11/2023   GLUCOSE 96 08/10/2023   Lab Results  Component Value Date   ALT 35 07/11/2023   AST 31 07/11/2023   ALKPHOS 55 07/11/2023   BILITOT 0.6 07/11/2023   Lab Results  Component Value Date   WBC 10.2 08/10/2023   HGB 7.7 (L) 08/10/2023   HCT 24.1 (L) 08/10/2023   MCV 103.9 (H) 08/10/2023   PLT 174 08/10/2023   Lab Results  Component Value Date   IRON 61 09/19/2022   TIBC 329 09/19/2022   FERRITIN 29 07/10/2023   Lab Results  Component Value Date   VITAMINB12 156 (L) 09/19/2022   Lab Results  Component Value Date    FOLATE 12.6 09/19/2022    Imaging Studies   DG Abd 1 View  Result Date: 08/10/2023 CLINICAL DATA:  Bloating and abdominal distension. EXAM: ABDOMEN - 1 VIEW COMPARISON:  None Available. FINDINGS: Bowel gas pattern appears nonspecific. There is no pathologically dilated loops of large or small bowel. Increased small-bowel gas noted, nonspecific. Mild stool burden noted within the colon. Postoperative changes identified within the lumbar spine. Imaged portions of the lung bases are clear. Seed implants noted within the prostate gland. IMPRESSION: 1. Nonspecific bowel gas pattern.  No signs of bowel obstruction. 2. Mild stool burden within the colon. Electronically Signed   By: Signa Kell M.D.   On: 08/10/2023 09:42    Assessment/Plan:    Anemia -IDA/B12 deficiency -treated for H.pylori gastritis 09/2022 with confirmed eradication -duodenal AVMs ablated last month -colonoscopy up to date -no overt GI bleeding lately -update labs, he may require transfusion if Hgb <7 due to symptomatic anemia, cannot rule out need for small bowel capsule study -recommend continues PPI, avoid NSAIDs -update medication list with daughter in law, Bronson Curb, RN.      Leanna Battles. Melvyn Neth, MHS, PA-C [facility]

## 2023-08-20 ENCOUNTER — Telehealth: Payer: Self-pay

## 2023-08-20 ENCOUNTER — Other Ambulatory Visit (HOSPITAL_COMMUNITY)
Admission: RE | Admit: 2023-08-20 | Discharge: 2023-08-20 | Disposition: A | Discharge status: Home / Self Care | Payer: Medicare HMO | Source: Ambulatory Visit | Attending: Gastroenterology | Admitting: Gastroenterology

## 2023-08-20 DIAGNOSIS — D500 Iron deficiency anemia secondary to blood loss (chronic): Secondary | ICD-10-CM | POA: Diagnosis present

## 2023-08-20 DIAGNOSIS — E538 Deficiency of other specified B group vitamins: Secondary | ICD-10-CM | POA: Insufficient documentation

## 2023-08-20 LAB — CBC WITH DIFFERENTIAL/PLATELET
Abs Immature Granulocytes: 0.02 10*3/uL (ref 0.00–0.07)
Basophils Absolute: 0.1 10*3/uL (ref 0.0–0.1)
Basophils Relative: 1 %
Eosinophils Absolute: 0.4 10*3/uL (ref 0.0–0.5)
Eosinophils Relative: 7 %
HCT: 27.5 % — ABNORMAL LOW (ref 39.0–52.0)
Hemoglobin: 8.6 g/dL — ABNORMAL LOW (ref 13.0–17.0)
Immature Granulocytes: 0 %
Lymphocytes Relative: 15 %
Lymphs Abs: 0.9 10*3/uL (ref 0.7–4.0)
MCH: 33.2 pg (ref 26.0–34.0)
MCHC: 31.3 g/dL (ref 30.0–36.0)
MCV: 106.2 fL — ABNORMAL HIGH (ref 80.0–100.0)
Monocytes Absolute: 0.6 10*3/uL (ref 0.1–1.0)
Monocytes Relative: 9 %
Neutro Abs: 4.2 10*3/uL (ref 1.7–7.7)
Neutrophils Relative %: 68 %
Platelets: 155 10*3/uL (ref 150–400)
RBC: 2.59 MIL/uL — ABNORMAL LOW (ref 4.22–5.81)
RDW: 18 % — ABNORMAL HIGH (ref 11.5–15.5)
WBC: 6.1 10*3/uL (ref 4.0–10.5)
nRBC: 0 % (ref 0.0–0.2)

## 2023-08-20 LAB — FOLATE: Folate: >40 ng/mL (ref >5.9)

## 2023-08-20 LAB — IRON AND TIBC
Iron: 170 ug/dL (ref 45–182)
Saturation Ratios: 50 % — ABNORMAL HIGH (ref 17.9–39.5)
TIBC: 340 ug/dL (ref 250–450)
UIBC: 170 ug/dL

## 2023-08-20 LAB — FERRITIN: Ferritin: 21 ng/mL — ABNORMAL LOW (ref 24–336)

## 2023-08-20 LAB — VITAMIN B12: Vitamin B-12: 1787 pg/mL — ABNORMAL HIGH (ref 180–914)

## 2023-08-20 NOTE — Addendum Note (Signed)
Addended by: Zada Finders on: 08/20/2023 01:50 PM   Modules accepted: Orders

## 2023-08-20 NOTE — Telephone Encounter (Signed)
Kiersten from [facility] lab called with an edited result on the patient's folate. It is greater than 40. Edited result is in Epic.

## 2023-08-20 NOTE — Telephone Encounter (Signed)
Spoke with pt's daughter Wilkie Aye, the only medication that was different was the pt is no longer on celebrex. Pt's chart has been updated.

## 2023-08-21 NOTE — Telephone Encounter (Signed)
noted 

## 2023-08-27 ENCOUNTER — Ambulatory Visit: Payer: Medicare HMO | Attending: Cardiology | Admitting: Cardiology

## 2023-08-27 ENCOUNTER — Encounter: Payer: Self-pay | Admitting: Cardiology

## 2023-08-27 VITALS — BP 134/68 | HR 94 | Ht 67.0 in | Wt 180.2 lb

## 2023-08-27 DIAGNOSIS — E782 Mixed hyperlipidemia: Secondary | ICD-10-CM | POA: Diagnosis not present

## 2023-08-27 DIAGNOSIS — I2510 Atherosclerotic heart disease of native coronary artery without angina pectoris: Secondary | ICD-10-CM

## 2023-08-27 DIAGNOSIS — I6523 Occlusion and stenosis of bilateral carotid arteries: Secondary | ICD-10-CM

## 2023-08-27 DIAGNOSIS — I10 Essential (primary) hypertension: Secondary | ICD-10-CM

## 2023-08-27 DIAGNOSIS — I350 Nonrheumatic aortic (valve) stenosis: Secondary | ICD-10-CM | POA: Diagnosis not present

## 2023-08-27 NOTE — Patient Instructions (Addendum)
Medication Instructions:   Continue all current medications.   Labwork:  none  Testing/Procedures:  Your physician has requested that you have an echocardiogram. Echocardiography is a painless test that uses sound waves to create images of your heart. It provides your doctor with information about the size and shape of your heart and how well your heart's chambers and valves are working. This procedure takes approximately one hour. There are no restrictions for this procedure. Please do NOT wear cologne, perfume, aftershave, or lotions (deodorant is allowed). Please arrive 15 minutes prior to your appointment time.  Please note: We ask at that you not bring children with you during ultrasound (echo/ vascular) testing. Due to room size and safety concerns, children are not allowed in the ultrasound rooms during exams. Our front office staff cannot provide observation of children in our lobby area while testing is being conducted. An adult accompanying a patient to their appointment will only be allowed in the ultrasound room at the discretion of the ultrasound technician under special circumstances. We apologize for any inconvenience. Office will contact with results via phone, letter or mychart.     Follow-Up:  6 months   Any Other Special Instructions Will Be Listed Below (If Applicable).   If you need a refill on your cardiac medications before your next appointment, please call your pharmacy.

## 2023-08-27 NOTE — Progress Notes (Unsigned)
  HISTORY AND PHYSICAL     CC:  follow up. Requesting Provider:  Benita Stabile, MD  HPI: This is a 77 y.o. male here for follow up for carotid artery stenosis.  Pt is s/p left TCAR for asymptomatic carotid artery stenosis on 07/19/2023 by Dr. Randie Heinz.  He previously had right TCAR  on 05/11/2022 also for asymptomatic carotid artery stenosis by Dr. Randie Heinz.  Pt returns today for follow up.    Pt denies any amaurosis fugax, speech difficulties, weakness, numbness, paralysis or clumsiness or facial droop.  States his vision has gotten more blurry but no temporary blindness.  He continues to smoke with no interest in quitting.  States he has had surgery for bladder and prostate cancer as well as orthopedic surgery.  He has been placed on Flomax.   He recently was hospitalized for GIB and received transfusion.  He had EGD and found to have 2 duodenal AVM's that were ablated.  He states that he was recently started on Losartan.  They discontinued his Celebrex.  The pt is on a statin for cholesterol management.  The pt is on a daily aspirin.   Other AC:  Plavix The pt is on CCB, diuretic, ARB, BB for hypertension.   The pt is  on medication for diabetes Tobacco hx:  current  PHYSICAL EXAMINATION:  Today's Vitals   08/28/23 1027 08/28/23 1029  BP: (!) 170/78 (!) 171/85  Pulse: 82   Temp: 98.2 F (36.8 C)   TempSrc: Temporal   SpO2: 99%   Weight: 180 lb 8 oz (81.9 kg)   Height: 5\' 7"  (1.702 m)    Body mass index is 28.27 kg/m.   General:  WDWN in NAD; vital signs documented above Gait: Not observed HENT: WNL, normocephalic Pulmonary: normal non-labored breathing Cardiac: regular HR Incision:  healing nicely  Neurologic: A&O X 3; moving all extremities equally; speech is fluent/normal  Psychiatric:  The pt has Normal affect.   Non-Invasive Vascular Imaging:   Carotid Duplex on 08/28/2023 No stenosis bilateral carotid stents Vertebrals:  Bilateral vertebral arteries demonstrate  antegrade flow.  Subclavians: Normal flow hemodynamics were seen in bilateral subclavian  arteries   Previous Carotid duplex on 03/27/2023: Right: 40-59% ICA stenosis Left:   60-79% ICA stenosis    ASSESSMENT/PLAN:: 77 y.o. male here for follow up carotid artery stenosis and is s/p  left TCAR for asymptomatic carotid artery stenosis on 07/19/2023 by Dr. Randie Heinz.  He previously had right TCAR  on 05/11/2022 also for asymptomatic carotid artery stenosis by Dr. Randie Heinz.  -duplex today reveals no stenosis in bilateral carotid stents -discussed s/s of stroke with pt and he understands should he develop any of these sx, he will go to the nearest ER or call 911. -pt will f/u in 9 months with carotid duplex -pt will call sooner should he have any issues. -continue statin/asa.  Given recent GIB issues, Dr. Randie Heinz discussed with pt that he can stop his Plavix and continue with baby asa.   -discussed quitting smoking but he has no interest.   Doreatha Massed, [facility] [phone]  Clinic MD:  pt seen with Dr. Randie Heinz

## 2023-08-27 NOTE — Progress Notes (Signed)
Clinical Summary [name] is a 77 y.o.male seen today for follow up of the following medical problems.      1. HTN - dizzy spells have resolved with spreading out bp meds throughout the day - compliant with meds      2. Carotid stenosis - RICA >70%, LICA <50%.  - has been on ASA and plavix, presume due to his history cerebrovascular disease  12/2021 RICA 80-99%, LICA 60-79%    04/2022 right ICA stenting with Dr Randie Heinz 05/2022 [place]  RICA mild, LICA 60-79% 07/2023 left TCAR      3. Hyperlipidemia - compliant with statin - reports most recent labs with pcp   11/2022 TC 117 TG 99 HDL 47 LDL 51 07/2023 TC 85 TG 85 HDL 37 LDL 31   4. OSA - compliant with CPAP - followed by Dr Vassie Loll   5. Prostate CA - history of prior seed implant, reports cancer free   6. CAD - incidental findings form prior chest CT in 2013. The patient has not had symptoms of angina - 05/2017 nuclear stress: possible mild inferoseptal ischemia vs artifact, low risk   - no cardiac chest pains   7. Moderate aortic aortic stenosis - 08/2021 echo moderate AS mean 27 mmHg, AVA VTI 1.05  07/2022 echo: LVEF 60-65%, moderate AS AVA VTI 1.15 mean grad 20   8. AAA screen - male over 34 former smoker - 02/2014 CT showed no aneurysm   9. GI bleed - admit Jan 2024 with symptomatic anemia, GI bleed - Hgb 7, received 1 unit pRBCs - EGD mottled gastric mucosa no clear ulcer or bleeding source, colon divetriculi no clear bleed - he was on ASA/plavix/celebrex at the time. Found to have hpylori gastiritis, completed treatment  - admit 07/2023 with GI bleed, Hgb 6.5. Given 2 units pRBCs - Underwent EGD on 08/09/2023--Normal esophagus, - Normal stomach, Duodenal AVMs x 2; ablated    10. Chronic back pain - on celebrex, has not been able to wean.  Past Medical History:  Diagnosis Date   Anemia    Iron Deficiency   Bilateral renal cysts    Bladder cancer (HCC) UROLOGIST-  DR [facility]   RECURRENT 11/ 2017 --  hx  turbt in 2004 and 08-19-2013   BPH without obstruction/lower urinary tract symptoms    Cervical fusion syndrome    LEFT ARM NUMBNESS--  CONTROLLED W/ GABAPENTIN   Complication of anesthesia    Coronary artery disease    CARDIOLOGIST-- DR Wyline Mood Lorelle Formosa)   DDD (degenerative disc disease), cervical    DDD (degenerative disc disease), lumbar    Diverticulosis of sigmoid colon    Heart murmur    slight murmur per patient   History of acute gouty arthritis    History of adenomatous polyp of colon    2001;  2012- non-malignant leiomyoma;  04-07-2014  tubualr adenoma and hyperplastic polyp's   History of prostate cancer UROLOGIST-  DR [facility]   S/P  RADIOACTIVE SEED IMPLANTS 2004   HOH (hard of hearing)    no hearing aids   Hyperlipidemia    Hypertension    OA (osteoarthritis)    Occlusion and stenosis of carotid artery without mention of cerebral infarction CARDIOLOGIST  -- DR  Christiane Ha Conswella Bruney   LAST DUPLEX  11-23-2015  RICA  (DUPLEX DOPPLER 01-20-2016 RICA 60-79% PROXIMAL) /   LICA <50% (HAD CONSULT W/ DR Imogene Burn , VASCULAR SURGEON 01-20-2016)   OSA on CPAP  BiPAP   Peripheral vascular disease (HCC)    Carotid Artery Stenosis   PONV (postoperative nausea and vomiting)    Prostate cancer (HCC)    per patient "years ago"- had radiation implant   Spondylosis, cervical    Type 2 diabetes mellitus (HCC)    Wears glasses      Allergies  Allergen Reactions   Dilaudid [Hydromorphone Hcl] Other (See Comments)    "acts a little off"   Lyrica [Pregabalin] Nausea Only and Other (See Comments)    Make me feel "bad"   Toradol [Ketorolac Tromethamine] Other (See Comments)    Caused hallucination - states not sure / avoids per his MD   Tramadol Other (See Comments)    Couldn't sleep     Current Outpatient Medications  Medication Sig Dispense Refill   acetaminophen (TYLENOL) 500 MG tablet Take 1,000 mg by mouth every 6 (six) hours as needed for moderate pain.     albuterol  (VENTOLIN HFA) 108 (90 Base) MCG/ACT inhaler Inhale 2 puffs into the lungs every 4 (four) hours as needed for shortness of breath or wheezing.     allopurinol (ZYLOPRIM) 300 MG tablet Take 300 mg by mouth at bedtime.     amLODipine (NORVASC) 10 MG tablet TAKE 1 TABLET BY MOUTH EVERY DAY 90 tablet 3   aspirin EC 81 MG tablet Take 1 tablet (81 mg total) by mouth daily with breakfast. 30 tablet 12   Budeson-Glycopyrrol-Formoterol (BREZTRI AEROSPHERE) 160-9-4.8 MCG/ACT AERO Inhale 1 puff into the lungs daily.     clopidogrel (PLAVIX) 75 MG tablet Take 75 mg by mouth daily.   6   colchicine 0.6 MG tablet Take 0.6 mg by mouth 2 (two) times daily as needed (gout).      cyanocobalamin (VITAMIN B12) 1000 MCG tablet Take 1 tablet (1,000 mcg total) by mouth daily. 30 tablet 1   docusate sodium (COLACE) 100 MG capsule Take 100 mg by mouth at bedtime.     ferrous sulfate 325 (65 FE) MG EC tablet Take 325 mg by mouth daily.     finasteride (PROSCAR) 5 MG tablet Take 5 mg by mouth at bedtime.     furosemide (LASIX) 40 MG tablet Take 40 mg by mouth every morning.     gabapentin (NEURONTIN) 300 MG capsule Take 300 mg by mouth 2 (two) times daily.     Iron-FA-B Cmp-C-Biot-Probiotic (FUSION PLUS) CAPS Take 1 capsule by mouth daily.     labetalol (NORMODYNE) 100 MG tablet Take 100 mg by mouth at bedtime.     losartan (COZAAR) 100 MG tablet Take 1 tablet (100 mg total) by mouth every evening. (Patient taking differently: Take 100 mg by mouth in the morning.) 30 tablet 6   metFORMIN (GLUCOPHAGE-XR) 500 MG 24 hr tablet Take 1 tablet (500 mg total) by mouth in the morning and at bedtime.     nicotine (NICODERM CQ - DOSED IN MG/24 HOURS) 14 mg/24hr patch Place 1 patch (14 mg total) onto the skin daily. 28 patch 0   NON FORMULARY Pt uses cpap     Omega-3-6-9 CAPS Take 1 capsule by mouth 2 (two) times daily.     pantoprazole (PROTONIX) 40 MG tablet Take 1 tablet (40 mg total) by mouth 2 (two) times daily. 180 tablet 3    polyethylene glycol powder (GLYCOLAX/MIRALAX) powder TAKE 17MG (1CAPFUL) BY MOUTH(MIXED WITH WATER/JUICE) DAILY 527 g 0   rosuvastatin (CRESTOR) 10 MG tablet Take 10 mg by mouth every morning.  sucralfate (CARAFATE) 1 GM/10ML suspension Take 10 mLs (1 g total) by mouth 4 (four) times daily. 420 mL 0   tamsulosin (FLOMAX) 0.4 MG CAPS capsule Take 0.4 mg by mouth daily.     Vitamin D, Ergocalciferol, (DRISDOL) 1.25 MG (50000 UNIT) CAPS capsule Take 50,000 Units by mouth every Thursday.     No current facility-administered medications for this visit.     Past Surgical History:  Procedure Laterality Date   ANTERIOR REMOVAL CAGE AND PLATE B2-W4/ CORPECTOMY C7 (FX)/  ALLOGRAFT AND FUSION C3 -- T1/  POSTERIOR DECOMPRESSION BILATERAL LAMINECTOMY C4 -- 6 & PARTIAL C3/  POSTEROLATERAL ARTHRODESIS C3 - T1  06/05/2005   APPENDECTOMY  1990'S   BIOPSY  09/21/2022   Procedure: BIOPSY;  Surgeon: Corbin Ade, MD;  [facility]: [facility];  Service: Endoscopy;;   CATARACT EXTRACTION W/ INTRAOCULAR LENS  IMPLANT, BILATERAL  2013   CERVICAL FUSION  05/11/2005   C3  --  C7/  due to post op quadriparesis same day s/p  anterior C 4,5,6, colpectomy, decompression, removal epidural hematoma, foraminnotomy, cage and plate   COLONOSCOPY  last one 04-07-2014   COLONOSCOPY WITH PROPOFOL N/A 09/21/2022   Procedure: COLONOSCOPY WITH PROPOFOL;  Surgeon: Corbin Ade, MD;  [facility]: [facility];  Service: Endoscopy;  Laterality: N/A;   CYSTOSCOPY W/ RETROGRADES Bilateral 08/19/2013   Procedure: CYSTOSCOPY WITH RETROGRADE PYELOGRAM;  Surgeon: Milford Cage, MD;  Location[facility];  Service: Urology;  Laterality: Bilateral;   CYSTOSCOPY W/ RETROGRADES Bilateral 07/18/2016   Procedure: CYSTOSCOPY WITH RETROGRADE PYELOGRAM;  Surgeon: Sebastian Ache, MD;  Location: [facility];  Service: Urology;  Laterality: Bilateral;   ENDOSCOPIC REPAIR CSF LEAK VIA NASAL PASSAGE  2011    ESOPHAGOGASTRODUODENOSCOPY (EGD) WITH PROPOFOL N/A 09/21/2022   Procedure: ESOPHAGOGASTRODUODENOSCOPY (EGD) WITH PROPOFOL;  Surgeon: Corbin Ade, MD;  [facility];  Service: Endoscopy;  Laterality: N/A;   ESOPHAGOGASTRODUODENOSCOPY (EGD) WITH PROPOFOL N/A 08/09/2023   Procedure: ESOPHAGOGASTRODUODENOSCOPY (EGD) WITH PROPOFOL;  Surgeon: Corbin Ade, MD;  [facility];  Service: Endoscopy;  Laterality: N/A;   HEMOSTASIS CLIP PLACEMENT  09/21/2022   Procedure: HEMOSTASIS CLIP PLACEMENT;  Surgeon: Corbin Ade, MD;  [facility];  Service: Endoscopy;;   HOT HEMOSTASIS  08/09/2023   Procedure: HOT HEMOSTASIS (ARGON PLASMA COAGULATION/BICAP);  Surgeon: Corbin Ade, MD;  [facility];  Service: Endoscopy;;   INTRAOPERATIVE ARTERIOGRAM  CATH LAB  01-05-2009  DR Jacinto Halim   RICA   ACUTE ANGLE 80-85% STENOSIS   KNEE ARTHROSCOPY Left 07/15/2017   Procedure: ARTHROSCOPY LEFT  KNEE AND DEBRIDEMENT, medial meniscal tear and chondromalaita;  Surgeon: Durene Romans, MD;  Location: [facility];  Service: Orthopedics;  Laterality: Left;  60 MINS   LUMBAR FUSION  03-26-2012   L3 --  L5   POLYPECTOMY  09/21/2022   Procedure: POLYPECTOMY;  Surgeon: Corbin Ade, MD;  [facility];  Service: Endoscopy;;   RADIOACTIVE PROSTATE SEED IMPLANTS  08-19-2003   SEPTOPLASTY  1998   TOTAL KNEE ARTHROPLASTY Left 02/25/2018   Procedure: LEFT TOTAL KNEE ARTHROPLASTY;  Surgeon: Durene Romans, MD;  Location: [facility];  Service: Orthopedics;  Laterality: Left;  70 mins   TRANSCAROTID ARTERY REVASCULARIZATION  Right 05/11/2022   Procedure: Right Transcarotid Artery Revascularization;  Surgeon: Maeola Harman, MD;  [facility];  Service: Vascular;  Laterality: Right;   TRANSCAROTID ARTERY REVASCULARIZATION  Left 07/19/2023  Procedure: LEFT TRANSCAROTID ARTERY REVASCULARIZATION;  Surgeon: Maeola Harman, MD;  Location[facility] OR;  Service: Vascular;   Laterality: Left;   TRANSURETHRAL RESECTION OF BLADDER TUMOR N/A 08/19/2013   Procedure: TRANSURETHRAL RESECTION OF BLADDER TUMOR (TURBT);  Surgeon: Milford Cage, MD;  Location: [facility];  Service: Urology;  Laterality: N/A;   TRANSURETHRAL RESECTION OF BLADDER TUMOR N/A 07/18/2016   Procedure: TRANSURETHRAL RESECTION OF BLADDER TUMOR (TURBT);  Surgeon: Sebastian Ache, MD;  Location: [facility];  Service: Urology;  Laterality: N/A;   ULTRASOUND GUIDANCE FOR VASCULAR ACCESS Right 07/19/2023   Procedure: ULTRASOUND GUIDANCE FOR VASCULAR ACCESS, RIGHT FEMORAL VEIN;  Surgeon: Maeola Harman, MD;  Location: [facility] OR;  Service: Vascular;  Laterality: Right;   YAG LASER APPLICATION  07/29/2012   Procedure: YAG LASER APPLICATION;  Surgeon: Loraine Leriche T. Nile Riggs, MD;  Location: [facility];  Service: Ophthalmology;  Laterality: Right;     Allergies  Allergen Reactions   Dilaudid [Hydromorphone Hcl] Other (See Comments)    "acts a little off"   Lyrica [Pregabalin] Nausea Only and Other (See Comments)    Make me feel "bad"   Toradol [Ketorolac Tromethamine] Other (See Comments)    Caused hallucination - states not sure / avoids per his MD   Tramadol Other (See Comments)    Couldn't sleep      Family History  Problem Relation Age of Onset   Breast cancer Mother        mets   Diabetes Mother    Heart disease Father        MI   Colon cancer Brother    Cancer Sister        male organs   Diabetes Sister        family hx   Arthritis Other        entire family   Diabetes Brother        x 2     Social History Mr. Caress reports that he has been smoking cigarettes. He has a 47 pack-year smoking history. He has never used smokeless tobacco. Mr. Offield reports no history of alcohol use.   Review of Systems CONSTITUTIONAL: No weight loss, fever, chills, weakness or fatigue.  HEENT: Eyes: No visual loss, blurred vision, double vision or yellow  sclerae.No hearing loss, sneezing, congestion, runny nose or sore throat.  SKIN: No rash or itching.  CARDIOVASCULAR: per hpi RESPIRATORY: No shortness of breath, cough or sputum.  GASTROINTESTINAL: No anorexia, nausea, vomiting or diarrhea. No abdominal pain or blood.  GENITOURINARY: No burning on urination, no polyuria NEUROLOGICAL: No headache, dizziness, syncope, paralysis, ataxia, numbness or tingling in the extremities. No change in bowel or bladder control.  MUSCULOSKELETAL: No muscle, back pain, joint pain or stiffness.  LYMPHATICS: No enlarged nodes. No history of splenectomy.  PSYCHIATRIC: No history of depression or anxiety.  ENDOCRINOLOGIC: No reports of sweating, cold or heat intolerance. No polyuria or polydipsia.  Marland Kitchen   Physical Examination Today's Vitals   08/27/23 1131  BP: 134/68  Pulse: 94  SpO2: 98%  Weight: 180 lb 3.2 oz (81.7 kg)  Height: 5\' 7"  (1.702 m)   Body mass index is 28.22 kg/m.  Gen: resting comfortably, no acute distress HEENT: no scleral icterus, pupils equal round and reactive, no palptable cervical adenopathy,  CV: RRR, 3/6 systolic murmur rusb Resp: Clear to auscultation bilaterally GI: abdomen is soft, non-tender, non-distended, normal bowel sounds, no hepatosplenomegaly MSK: extremities are warm, no edema.  Skin:  warm, no rash Neuro:  no focal deficits Psych: appropriate affect   Diagnostic Studies 05/2017 nuclear stress No diagnostic ST segment changes indicating ischemia. No arrhythmias. Moderate-sized, mild intensity, inferior and inferoseptal defects that are largely fixed with a partial region of reversibility in the mid inferoseptal zone. Normal wall motion in this area argues against scar. Although diaphragmatic attenuation is likely playing a role, consider a small region of inferoseptal ischemia. This is a low risk study. Nuclear stress EF: 64%.     07/2022 echo 1. Left ventricular ejection fraction, by estimation, is 60 to  65%. The  left ventricle has normal function. The left ventricle has no regional  wall motion abnormalities. There is mild left ventricular hypertrophy.  Left ventricular diastolic parameters  are indeterminate.   2. Right ventricular systolic function is normal. The right ventricular  size is normal. There is normal pulmonary artery systolic pressure.   3. Aortic valve leaflets are densely calcified with moderate to severe  restriction of the leaflet mobility. There is moderate aortic valve  stenosis present with calculated aortic valve area of 1.15 cm2, Vmax 2.9  m/sec and mean pressure gradient of 20  mmHg.   4. The inferior vena cava is normal in size with greater than 50%  respiratory variability, suggesting right atrial pressure of 3 mmHg.     Assessment and Plan   1 . HTN -at goal, continue current meds     2. Carotid stenosis -history of bilateral stenting to carotids, followed by vascular - DAPT per vascular, not on for cardiac reason.    3. Hyperlipidemia -at goal, continue current meds   4. CAD - incidental finding on CT scan, he has not had any cardiac symptoms - prior low risk stress test - no cardiac symptoms, continue current meds   5. Aortic stenosis - moderate by US,we will repeat echo  F/u 6 months   Antoine Poche, M.D.

## 2023-08-28 ENCOUNTER — Ambulatory Visit (HOSPITAL_COMMUNITY)
Admission: RE | Admit: 2023-08-28 | Discharge: 2023-08-28 | Disposition: A | Discharge status: Home / Self Care | Payer: Medicare HMO | Source: Ambulatory Visit | Attending: Vascular Surgery | Admitting: Vascular Surgery

## 2023-08-28 ENCOUNTER — Ambulatory Visit (INDEPENDENT_AMBULATORY_CARE_PROVIDER_SITE_OTHER): Payer: Medicare HMO | Admitting: Physician Assistant

## 2023-08-28 VITALS — BP 171/85 | HR 82 | Temp 98.2°F | Ht 67.0 in | Wt 180.5 lb

## 2023-08-28 DIAGNOSIS — I6523 Occlusion and stenosis of bilateral carotid arteries: Secondary | ICD-10-CM | POA: Insufficient documentation

## 2023-08-28 DIAGNOSIS — I6522 Occlusion and stenosis of left carotid artery: Secondary | ICD-10-CM | POA: Insufficient documentation

## 2023-08-29 ENCOUNTER — Other Ambulatory Visit: Payer: Self-pay | Admitting: *Deleted

## 2023-08-29 ENCOUNTER — Other Ambulatory Visit: Payer: Self-pay

## 2023-08-29 DIAGNOSIS — I6523 Occlusion and stenosis of bilateral carotid arteries: Secondary | ICD-10-CM

## 2023-08-29 DIAGNOSIS — D500 Iron deficiency anemia secondary to blood loss (chronic): Secondary | ICD-10-CM

## 2023-09-04 ENCOUNTER — Inpatient Hospital Stay: Payer: Medicare HMO | Attending: Hematology | Admitting: Hematology

## 2023-09-04 ENCOUNTER — Other Ambulatory Visit: Payer: Medicare HMO

## 2023-09-04 VITALS — BP 145/70 | HR 88 | Temp 98.7°F | Resp 18 | Ht 67.0 in | Wt 181.8 lb

## 2023-09-04 DIAGNOSIS — G4733 Obstructive sleep apnea (adult) (pediatric): Secondary | ICD-10-CM

## 2023-09-04 DIAGNOSIS — D500 Iron deficiency anemia secondary to blood loss (chronic): Secondary | ICD-10-CM

## 2023-09-04 DIAGNOSIS — K31811 Angiodysplasia of stomach and duodenum with bleeding: Secondary | ICD-10-CM

## 2023-09-04 DIAGNOSIS — I10 Essential (primary) hypertension: Secondary | ICD-10-CM | POA: Diagnosis not present

## 2023-09-04 DIAGNOSIS — D508 Other iron deficiency anemias: Secondary | ICD-10-CM

## 2023-09-04 DIAGNOSIS — I2510 Atherosclerotic heart disease of native coronary artery without angina pectoris: Secondary | ICD-10-CM | POA: Diagnosis not present

## 2023-09-04 DIAGNOSIS — Z8546 Personal history of malignant neoplasm of prostate: Secondary | ICD-10-CM | POA: Diagnosis not present

## 2023-09-04 NOTE — Patient Instructions (Signed)
You were seen and examined today by Dr. Ellin Saba. Dr. Ellin Saba is a hematologist, meaning that he specializes in blood abnormalities. Dr. Ellin Saba discussed your past medical history, family history of cancers/blood conditions and the events that led to you being here today.  You were referred to Dr. Ellin Saba due to iron deficiency anemia.  We will arrange for you to have IV iron infusion in the clinic.   Follow-up as scheduled.

## 2023-09-04 NOTE — Progress Notes (Signed)
[facility] [address] [place] [address]   Clinic Day:  09/04/2023  Referring physician: Benita Stabile, MD  Patient Care Team: Benita Stabile, MD as PCP - General (Internal Medicine) Wyline Mood Dorothe Pea, MD as PCP - Cardiology (Cardiology)   ASSESSMENT & PLAN:   Assessment:  1.  Severe iron deficiency anemia: - Recent admission from 08/09/2023 through 08/10/2023 with hemoglobin 6.5, status post 1 unit PRBC.  EGD with 2 duodenal AVMs cauterized. - Previous admission to the hospital in January 2024, s/p 1 unit PRBC. - Has been on fusion plus iron tablet from the beginning of the year. - Colonoscopy (09/21/2022): Diverticulosis in the sigmoid colon, descending colon and splenic flexure.  10 mm tubular adenoma of the cecum removed.  2.  Social/family history: - Lives at home with his daughter-in-law.  Independent of ADLs and IADLs. - Current active smoker, 1 pack/day started at age 52. - Sister had colon cancer.  Mother had breast cancer and brother had liver/lung cancer.  Plan:  1.  Severe iron deficiency anemia: - We reviewed recent labs from 08/20/2023 with low ferritin and hemoglobin.  MCV was elevated since 08/09/2023 but normal prior to that.  Likely from reticulocytosis. - We talked about parenteral iron therapy with INFeD 1 g IV x 1 with premedications.  We discussed side effects including rare chance of anaphylactic reaction.  We will schedule it within the next 1 week. - Will plan to repeat CBC, ferritin and iron panel in 6 weeks.  He was told to call us if he feels weak or lightheaded to check his blood.   Orders Placed This Encounter  Procedures   CBC with Differential    Standing Status:   Future    Expected Date:   10/16/2023    Expiration Date:   09/03/2024   Iron and TIBC ([facility] ONLY)    Standing Status:   Future    Expected Date:   10/16/2023    Expiration Date:   09/03/2024   Ferritin    Standing Status:   Future     Expected Date:   10/16/2023    Expiration Date:   09/03/2024      Mikeal Hawthorne R Teague,acting as a scribe for Doreatha Massed, MD.,have documented all relevant documentation on the behalf of Doreatha Massed, MD,as directed by  Doreatha Massed, MD while in the presence of Doreatha Massed, MD.   I, Doreatha Massed MD, have reviewed the above documentation for accuracy and completeness, and I agree with the above.   Doreatha Massed, MD   12/18/20244:37 PM  CHIEF COMPLAINT/PURPOSE OF CONSULT:   Diagnosis: Iron deficiency anemia from blood loss  Current Therapy: INFeD  HISTORY OF PRESENT ILLNESS:   Jawaun is a 77 y.o. male presenting to clinic today for evaluation of IDA at the request of Tiffany Kocher, PA-C.  Patient has a history of HTN, HLD, DM type 2, CAD, BPH, prostate cancer (remission), carotid artery stenosis (s/p endarterectomy), obstructive sleep apnea, and gout arthritis (on celecoxib). He was admitted to the hospital from 08/09/23 to 08/10/23 for a GI hemorrhage with melena and symptomatic anemia. He underwent an EGD on 11/22, which found 2 duodenal AVMs that were ablated. He was transfused 2 units of PRBC.   Today, he states that he is doing well overall. His appetite level is at 100%. His energy level is at 10%.    PAST MEDICAL HISTORY:   Past Medical History: Past Medical History:  Diagnosis Date   Anemia    Iron Deficiency   Bilateral renal cysts    Bladder cancer (HCC) UROLOGIST-  DR [facility]   RECURRENT 11/ 2017 --  hx turbt in 2004 and 08-19-2013   BPH without obstruction/lower urinary tract symptoms    Cervical fusion syndrome    LEFT ARM NUMBNESS--  CONTROLLED W/ GABAPENTIN   Complication of anesthesia    Coronary artery disease    CARDIOLOGIST-- DR Wyline Mood Lorelle Formosa)   DDD (degenerative disc disease), cervical    DDD (degenerative disc disease), lumbar    Diverticulosis of sigmoid colon    Heart murmur    slight murmur  per patient   History of acute gouty arthritis    History of adenomatous polyp of colon    2001;  2012- non-malignant leiomyoma;  04-07-2014  tubualr adenoma and hyperplastic polyp's   History of prostate cancer UROLOGIST-  DR [facility]   S/P  RADIOACTIVE SEED IMPLANTS 2004   HOH (hard of hearing)    no hearing aids   Hyperlipidemia    Hypertension    OA (osteoarthritis)    Occlusion and stenosis of carotid artery without mention of cerebral infarction CARDIOLOGIST  -- DR  Christiane Ha BRANCH   LAST DUPLEX  11-23-2015  RICA  (DUPLEX DOPPLER 01-20-2016 RICA 60-79% PROXIMAL) /   LICA <50% (HAD CONSULT W/ DR Imogene Burn , VASCULAR SURGEON 01-20-2016)   OSA on CPAP    BiPAP   Peripheral vascular disease (HCC)    Carotid Artery Stenosis   PONV (postoperative nausea and vomiting)    Prostate cancer (HCC)    per patient "years ago"- had radiation implant   Spondylosis, cervical    Type 2 diabetes mellitus (HCC)    Wears glasses     Surgical History: Past Surgical History:  Procedure Laterality Date   ANTERIOR REMOVAL CAGE AND PLATE Z6-X0/ CORPECTOMY C7 (FX)/  ALLOGRAFT AND FUSION C3 -- T1/  POSTERIOR DECOMPRESSION BILATERAL LAMINECTOMY C4 -- 6 & PARTIAL C3/  POSTEROLATERAL ARTHRODESIS C3 - T1  06/05/2005   APPENDECTOMY  1990'S   BIOPSY  09/21/2022   Procedure: BIOPSY;  Surgeon: Corbin Ade, MD;  [facility];  Service: Endoscopy;;   CATARACT EXTRACTION W/ INTRAOCULAR LENS  IMPLANT, BILATERAL  2013   CERVICAL FUSION  05/11/2005   C3  --  C7/  due to post op quadriparesis same day s/p  anterior C 4,5,6, colpectomy, decompression, removal epidural hematoma, foraminnotomy, cage and plate   COLONOSCOPY  last one 04-07-2014   COLONOSCOPY WITH PROPOFOL N/A 09/21/2022   Procedure: COLONOSCOPY WITH PROPOFOL;  Surgeon: Corbin Ade, MD;  [facility];  Service: Endoscopy;  Laterality: N/A;   CYSTOSCOPY W/ RETROGRADES Bilateral 08/19/2013   Procedure: CYSTOSCOPY WITH RETROGRADE  PYELOGRAM;  Surgeon: Milford Cage, MD;  Location: [facility];  Service: Urology;  Laterality: Bilateral;   CYSTOSCOPY W/ RETROGRADES Bilateral 07/18/2016   Procedure: CYSTOSCOPY WITH RETROGRADE PYELOGRAM;  Surgeon: Sebastian Ache, MD;  Location: [facility];  Service: Urology;  Laterality: Bilateral;   ENDOSCOPIC REPAIR CSF LEAK VIA NASAL PASSAGE  2011   ESOPHAGOGASTRODUODENOSCOPY (EGD) WITH PROPOFOL N/A 09/21/2022   Procedure: ESOPHAGOGASTRODUODENOSCOPY (EGD) WITH PROPOFOL;  Surgeon: Corbin Ade, MD;  [facility];  Service: Endoscopy;  Laterality: N/A;   ESOPHAGOGASTRODUODENOSCOPY (EGD) WITH PROPOFOL N/A 08/09/2023   Procedure: ESOPHAGOGASTRODUODENOSCOPY (EGD) WITH PROPOFOL;  Surgeon: Corbin Ade, MD;  [facility];  Service: Endoscopy;  Laterality: N/A;   HEMOSTASIS CLIP PLACEMENT  09/21/2022   Procedure: HEMOSTASIS CLIP PLACEMENT;  Surgeon: Corbin Ade, MD;  [facility];  Service: Endoscopy;;   HOT HEMOSTASIS  08/09/2023   Procedure: HOT HEMOSTASIS (ARGON PLASMA COAGULATION/BICAP);  Surgeon: Corbin Ade, MD;  [facility];  Service: Endoscopy;;   INTRAOPERATIVE ARTERIOGRAM  CATH LAB  01-05-2009  DR Jacinto Halim   RICA   ACUTE ANGLE 80-85% STENOSIS   KNEE ARTHROSCOPY Left 07/15/2017   Procedure: ARTHROSCOPY LEFT  KNEE AND DEBRIDEMENT, medial meniscal tear and chondromalaita;  Surgeon: Durene Romans, MD;  Location: [facility];  Service: Orthopedics;  Laterality: Left;  60 MINS   LUMBAR FUSION  03-26-2012   L3 --  L5   POLYPECTOMY  09/21/2022   Procedure: POLYPECTOMY;  Surgeon: Corbin Ade, MD;  [facility];  Service: Endoscopy;;   RADIOACTIVE PROSTATE SEED IMPLANTS  08-19-2003   SEPTOPLASTY  1998   TOTAL KNEE ARTHROPLASTY Left 02/25/2018   Procedure: LEFT TOTAL KNEE ARTHROPLASTY;  Surgeon: Durene Romans, MD;  Location: [facility];  Service: Orthopedics;  Laterality: Left;  70 mins   TRANSCAROTID  ARTERY REVASCULARIZATION  Right 05/11/2022   Procedure: Right Transcarotid Artery Revascularization;  Surgeon: Maeola Harman, MD;  [facility];  Service: Vascular;  Laterality: Right;   TRANSCAROTID ARTERY REVASCULARIZATION  Left 07/19/2023   Procedure: LEFT TRANSCAROTID ARTERY REVASCULARIZATION;  Surgeon: Maeola Harman, MD;  [facility];  Service: Vascular;  Laterality: Left;   TRANSURETHRAL RESECTION OF BLADDER TUMOR N/A 08/19/2013   Procedure: TRANSURETHRAL RESECTION OF BLADDER TUMOR (TURBT);  Surgeon: Milford Cage, MD;  Location: [facility];  Service: Urology;  Laterality: N/A;   TRANSURETHRAL RESECTION OF BLADDER TUMOR N/A 07/18/2016   Procedure: TRANSURETHRAL RESECTION OF BLADDER TUMOR (TURBT);  Surgeon: Sebastian Ache, MD;  Location: [facility];  Service: Urology;  Laterality: N/A;   ULTRASOUND GUIDANCE FOR VASCULAR ACCESS Right 07/19/2023   Procedure: ULTRASOUND GUIDANCE FOR VASCULAR ACCESS, RIGHT FEMORAL VEIN;  Surgeon: Maeola Harman, MD;  Location: [facility] OR;  Service: Vascular;  Laterality: Right;   YAG LASER APPLICATION  07/29/2012   Procedure: YAG LASER APPLICATION;  Surgeon: Loraine Leriche T. Nile Riggs, MD;  Location: [facility];  Service: Ophthalmology;  Laterality: Right;    Social History: Social History   Socioeconomic History   Marital status: Widowed    Spouse name: Not on file   Number of children: 2   Years of education: Not on file   Highest education level: Not on file  Occupational History   Occupation: retired//disabled  Tobacco Use   Smoking status: Every Day    Current packs/day: 1.00    Average packs/day: 1 pack/day for 47.0 years (47.0 ttl pk-yrs)    Types: Cigarettes   Smokeless tobacco: Never   Tobacco comments:    pt quits smoking periodically :: started smoking again Jan 2018 1ppd  Vaping Use   Vaping status: Never Used  Substance and Sexual Activity   Alcohol use: No    Alcohol/week:  0.0 standard drinks of alcohol   Drug use: No   Sexual activity: Not Currently  Other Topics Concern   Not on file  Social History Narrative   Not on file   Social Drivers of Health   Financial Resource Strain: Not on file  Food Insecurity: No Food Insecurity (08/09/2023)   Hunger Vital Sign    Worried About Running Out of Food in the Last  Year: Never true    Ran Out of Food in the Last Year: Never true  Transportation Needs: No Transportation Needs (08/09/2023)   PRAPARE - Administrator, Civil Service (Medical): No    Lack of Transportation (Non-Medical): No  Physical Activity: Not on file  Stress: Not on file  Social Connections: Not on file  Intimate Partner Violence: Not At Risk (08/09/2023)   Humiliation, Afraid, Rape, and Kick questionnaire    Fear of Current or Ex-Partner: No    Emotionally Abused: No    Physically Abused: No    Sexually Abused: No    Family History: Family History  Problem Relation Age of Onset   Breast cancer Mother        mets   Diabetes Mother    Heart disease Father        MI   Colon cancer Brother    Cancer Sister        male organs   Diabetes Sister        family hx   Arthritis Other        entire family   Diabetes Brother        x 2    Current Medications:  Current Outpatient Medications:    acetaminophen (TYLENOL) 500 MG tablet, Take 1,000 mg by mouth every 6 (six) hours as needed for moderate pain., Disp: , Rfl:    albuterol (VENTOLIN HFA) 108 (90 Base) MCG/ACT inhaler, Inhale 2 puffs into the lungs every 4 (four) hours as needed for shortness of breath or wheezing., Disp: , Rfl:    allopurinol (ZYLOPRIM) 300 MG tablet, Take 300 mg by mouth at bedtime., Disp: , Rfl:    amLODipine (NORVASC) 10 MG tablet, TAKE 1 TABLET BY MOUTH EVERY DAY, Disp: 90 tablet, Rfl: 3   aspirin EC 81 MG tablet, Take 1 tablet (81 mg total) by mouth daily with breakfast., Disp: 30 tablet, Rfl: 12   Budeson-Glycopyrrol-Formoterol (BREZTRI  AEROSPHERE) 160-9-4.8 MCG/ACT AERO, Inhale 1 puff into the lungs daily., Disp: , Rfl:    colchicine 0.6 MG tablet, Take 0.6 mg by mouth 2 (two) times daily as needed (gout). , Disp: , Rfl:    cyanocobalamin (VITAMIN B12) 1000 MCG tablet, Take 1 tablet (1,000 mcg total) by mouth daily., Disp: 30 tablet, Rfl: 1   docusate sodium (COLACE) 100 MG capsule, Take 100 mg by mouth at bedtime., Disp: , Rfl:    ferrous sulfate 325 (65 FE) MG EC tablet, Take 325 mg by mouth daily., Disp: , Rfl:    finasteride (PROSCAR) 5 MG tablet, Take 5 mg by mouth at bedtime., Disp: , Rfl:    furosemide (LASIX) 40 MG tablet, Take 40 mg by mouth every morning., Disp: , Rfl:    gabapentin (NEURONTIN) 300 MG capsule, Take 300 mg by mouth 2 (two) times daily., Disp: , Rfl:    Iron-FA-B Cmp-C-Biot-Probiotic (FUSION PLUS) CAPS, Take 1 capsule by mouth daily., Disp: , Rfl:    labetalol (NORMODYNE) 100 MG tablet, Take 100 mg by mouth at bedtime., Disp: , Rfl:    losartan (COZAAR) 100 MG tablet, Take 1 tablet (100 mg total) by mouth every evening. (Patient taking differently: Take 100 mg by mouth in the morning.), Disp: 30 tablet, Rfl: 6   metFORMIN (GLUCOPHAGE-XR) 500 MG 24 hr tablet, Take 1 tablet (500 mg total) by mouth in the morning and at bedtime., Disp: , Rfl:    NON FORMULARY, Pt uses cpap, Disp: , Rfl:  Omega-3-6-9 CAPS, Take 1 capsule by mouth 2 (two) times daily., Disp: , Rfl:    pantoprazole (PROTONIX) 40 MG tablet, Take 1 tablet (40 mg total) by mouth 2 (two) times daily., Disp: 180 tablet, Rfl: 3   polyethylene glycol powder (GLYCOLAX/MIRALAX) powder, TAKE 17MG (1CAPFUL) BY MOUTH(MIXED WITH WATER/JUICE) DAILY, Disp: 527 g, Rfl: 0   rosuvastatin (CRESTOR) 10 MG tablet, Take 10 mg by mouth every morning. , Disp: , Rfl:    sucralfate (CARAFATE) 1 GM/10ML suspension, Take 10 mLs (1 g total) by mouth 4 (four) times daily., Disp: 420 mL, Rfl: 0   tamsulosin (FLOMAX) 0.4 MG CAPS capsule, Take 0.4 mg by mouth daily., Disp: ,  Rfl:    Vitamin D, Ergocalciferol, (DRISDOL) 1.25 MG (50000 UNIT) CAPS capsule, Take 50,000 Units by mouth every Thursday., Disp: , Rfl:    Allergies: Allergies  Allergen Reactions   Dilaudid [Hydromorphone Hcl] Other (See Comments)    "acts a little off"   Lyrica [Pregabalin] Nausea Only and Other (See Comments)    Make me feel "bad"   Toradol [Ketorolac Tromethamine] Other (See Comments)    Caused hallucination - states not sure / avoids per his MD   Tramadol Other (See Comments)    Couldn't sleep    REVIEW OF SYSTEMS:   Review of Systems  Constitutional:  Negative for chills, fatigue and fever.  HENT:   Negative for lump/mass, mouth sores, nosebleeds, sore throat and trouble swallowing.   Eyes:  Negative for eye problems.  Respiratory:  Positive for cough and shortness of breath.   Cardiovascular:  Positive for palpitations. Negative for chest pain and leg swelling.  Gastrointestinal:  Negative for abdominal pain, constipation, diarrhea, nausea and vomiting.  Genitourinary:  Negative for bladder incontinence, difficulty urinating, dysuria, frequency, hematuria and nocturia.   Musculoskeletal:  Negative for arthralgias, back pain, flank pain, myalgias and neck pain.  Skin:  Negative for itching and rash.  Neurological:  Positive for dizziness and headaches. Negative for numbness.  Hematological:  Does not bruise/bleed easily.  Psychiatric/Behavioral:  Positive for sleep disturbance. Negative for depression and suicidal ideas. The patient is not nervous/anxious.   All other systems reviewed and are negative.    VITALS:   Blood pressure (!) 145/70, pulse 88, temperature 98.7 F (37.1 C), temperature source Tympanic, resp. rate 18, height 5\' 7"  (1.702 m), weight 181 lb 12.8 oz (82.5 kg), SpO2 98%.  Wt Readings from Last 3 Encounters:  09/04/23 181 lb 12.8 oz (82.5 kg)  08/28/23 180 lb 8 oz (81.9 kg)  08/27/23 180 lb 3.2 oz (81.7 kg)    Body mass index is 28.47  kg/m.   PHYSICAL EXAM:   Physical Exam Vitals and nursing note reviewed. Exam conducted with a chaperone present.  Constitutional:      Appearance: Normal appearance.  Cardiovascular:     Rate and Rhythm: Normal rate and regular rhythm.     Pulses: Normal pulses.     Heart sounds: Normal heart sounds.  Pulmonary:     Effort: Pulmonary effort is normal.     Breath sounds: Normal breath sounds.  Abdominal:     Palpations: Abdomen is soft. There is no hepatomegaly, splenomegaly or mass.     Tenderness: There is no abdominal tenderness.  Musculoskeletal:     Right lower leg: No edema.     Left lower leg: No edema.  Neurological:     General: No focal deficit present.     Mental Status: He is alert  and oriented to person, place, and time.  Psychiatric:        Mood and Affect: Mood normal.        Behavior: Behavior normal.     LABS:      Latest Ref Rng & Units 08/20/2023    9:28 AM 08/10/2023    3:59 AM 08/09/2023    8:47 PM  CBC  WBC 4.0 - 10.5 K/uL 6.1  10.2    Hemoglobin 13.0 - 17.0 g/dL 8.6  7.7  7.3   Hematocrit 39.0 - 52.0 % 27.5  24.1  22.5   Platelets 150 - 400 K/uL 155  174        Latest Ref Rng & Units 08/10/2023    3:59 AM 08/09/2023   10:27 AM 07/20/2023    4:25 AM  CMP  Glucose 70 - 99 mg/dL 96  829  562   BUN 8 - 23 mg/dL 27  39  24   Creatinine 0.61 - 1.24 mg/dL 1.30  8.65  7.84   Sodium 135 - 145 mmol/L 143  137  138   Potassium 3.5 - 5.1 mmol/L 4.4  4.1  3.9   Chloride 98 - 111 mmol/L 107  105  105   CO2 22 - 32 mmol/L 26  25  25    Calcium 8.9 - 10.3 mg/dL 8.9  8.8  9.0      No results found for: "CEA1", "CEA" / No results found for: "CEA1", "CEA" No results found for: "PSA1" No results found for: "ONG295" No results found for: "CAN125"  No results found for: "TOTALPROTELP", "ALBUMINELP", "A1GS", "A2GS", "BETS", "BETA2SER", "GAMS", "MSPIKE", "SPEI" Lab Results  Component Value Date   TIBC 340 08/20/2023   TIBC 329 09/19/2022   FERRITIN  21 (L) 08/20/2023   FERRITIN 29 07/10/2023   FERRITIN 20 (L) 04/10/2023   IRONPCTSAT 50 (H) 08/20/2023   IRONPCTSAT 19 09/19/2022   No results found for: "LDH"   STUDIES:   VAS US CAROTID Result Date: 08/28/2023 Carotid Arterial Duplex Study Patient Name:  [name]  Date of Exam:   08/28/2023 Medical Rec #: [id number]       Accession #:    [id number] Date of Birth: [birth year]        Patient Gender: M Patient Age:   54 years Exam Location:  [facility] Procedure:      VAS US CAROTID Referring Phys: Lemar Livings --------------------------------------------------------------------------------  Indications:      Carotid artery disease and Right TCAR 05/11/22 Left TCAR                   07/19/23. Comparison Study: 03/27/23: Right 40-59% stent. Left 60-79% ICA Performing Technologist: Thereasa Parkin RVT  Examination Guidelines: A complete evaluation includes B-mode imaging, spectral Doppler, color Doppler, and power Doppler as needed of all accessible portions of each vessel. Bilateral testing is considered an integral part of a complete examination. Limited examinations for reoccurring indications may be performed as noted.  Right Carotid Findings: +----------+--------+--------+--------+------------------+------------+           PSV cm/sEDV cm/sStenosisPlaque DescriptionComments     +----------+--------+--------+--------+------------------+------------+ CCA Prox  79      14                                             +----------+--------+--------+--------+------------------+------------+ CCA Mid   93  20                                             +----------+--------+--------+--------+------------------+------------+ CCA Distal79      15                                stent inflow +----------+--------+--------+--------+------------------+------------+ ICA Prox                                            stent         +----------+--------+--------+--------+------------------+------------+ ICA Mid                                             stent        +----------+--------+--------+--------+------------------+------------+ ICA Distal99      24                                             +----------+--------+--------+--------+------------------+------------+ ECA       88      0               heterogenous                   +----------+--------+--------+--------+------------------+------------+ +----------+--------+-------+----------------+-------------------+           PSV cm/sEDV cmsDescribe        Arm Pressure (mmHG) +----------+--------+-------+----------------+-------------------+ [id number]            Multiphasic, [id number]                 +----------+--------+-------+----------------+-------------------+ +---------+--------+--+--------+--+---------+ VertebralPSV cm/s54EDV cm/s15Antegrade +---------+--------+--+--------+--+---------+ Right Stent(s): +---------------+--------+--------+--------+--------+--------+ ICA            PSV cm/sEDV cm/sStenosisWaveformComments +---------------+--------+--------+--------+--------+--------+ Prox to Stent  79      15                               +---------------+--------+--------+--------+--------+--------+ Proximal Stent 64      11                               +---------------+--------+--------+--------+--------+--------+ Mid Stent      137     34                               +---------------+--------+--------+--------+--------+--------+ Distal Stent   96      26                               +---------------+--------+--------+--------+--------+--------+ Distal to Stent99      24                               +---------------+--------+--------+--------+--------+--------+  Left Carotid Findings: +----------+--------+--------+--------+------------------+------------+  PSV cm/sEDV  cm/sStenosisPlaque DescriptionComments     +----------+--------+--------+--------+------------------+------------+ CCA Prox  95      19                                             +----------+--------+--------+--------+------------------+------------+ CCA Mid   90      22                                             +----------+--------+--------+--------+------------------+------------+ CCA Distal78      18              heterogenous      stent inflow +----------+--------+--------+--------+------------------+------------+ ICA Prox                                            stent        +----------+--------+--------+--------+------------------+------------+ ICA Mid                                             stent        +----------+--------+--------+--------+------------------+------------+ ICA Distal89      23                                             +----------+--------+--------+--------+------------------+------------+ ECA       97      0                                              +----------+--------+--------+--------+------------------+------------+ +----------+--------+--------+----------------+-------------------+           PSV cm/sEDV cm/sDescribe        Arm Pressure (mmHG) +----------+--------+--------+----------------+-------------------+ [id number]             Multiphasic, [id number]                 +----------+--------+--------+----------------+-------------------+ +---------+--------+--+--------+--+---------+ VertebralPSV cm/s39EDV cm/s10Antegrade +---------+--------+--+--------+--+---------+ Left Stent(s): +---------------+--------+--------+--------+--------+--------+ ICA            PSV cm/sEDV cm/sStenosisWaveformComments +---------------+--------+--------+--------+--------+--------+ Prox to Stent  78      18                               +---------------+--------+--------+--------+--------+--------+ Proximal  Stent 92      21                               +---------------+--------+--------+--------+--------+--------+ Mid Stent      121     32                               +---------------+--------+--------+--------+--------+--------+ Distal Stent   113     27                               +---------------+--------+--------+--------+--------+--------+  Distal to Stent86      24                               +---------------+--------+--------+--------+--------+--------+  Summary: Right Carotid: Paatent stent with no stenosis. Left Carotid: Patent stent with no stenosis. Vertebrals:  Bilateral vertebral arteries demonstrate antegrade flow. Subclavians: Normal flow hemodynamics were seen in bilateral subclavian              arteries. *See table(s) above for measurements and observations.  Electronically signed by Lemar Livings MD on 08/28/2023 at 5:19:01 PM.    Final    DG Abd 1 View Result Date: 08/10/2023 CLINICAL DATA:  Bloating and abdominal distension. EXAM: ABDOMEN - 1 VIEW COMPARISON:  None Available. FINDINGS: Bowel gas pattern appears nonspecific. There is no pathologically dilated loops of large or small bowel. Increased small-bowel gas noted, nonspecific. Mild stool burden noted within the colon. Postoperative changes identified within the lumbar spine. Imaged portions of the lung bases are clear. Seed implants noted within the prostate gland. IMPRESSION: 1. Nonspecific bowel gas pattern.  No signs of bowel obstruction. 2. Mild stool burden within the colon. Electronically Signed   By: Signa Kell M.D.   On: 08/10/2023 09:42

## 2023-09-06 ENCOUNTER — Inpatient Hospital Stay: Payer: Medicare HMO

## 2023-09-06 VITALS — BP 121/54 | HR 76 | Temp 97.5°F | Resp 18

## 2023-09-06 DIAGNOSIS — D500 Iron deficiency anemia secondary to blood loss (chronic): Secondary | ICD-10-CM

## 2023-09-06 DIAGNOSIS — K31811 Angiodysplasia of stomach and duodenum with bleeding: Secondary | ICD-10-CM | POA: Diagnosis not present

## 2023-09-06 MED ORDER — SODIUM CHLORIDE 0.9 % IV SOLN
50.0000 mg | Freq: Once | INTRAVENOUS | Status: AC
  Administered 2023-09-06: 50 mg via INTRAVENOUS
  Filled 2023-09-06: qty 1

## 2023-09-06 MED ORDER — ACETAMINOPHEN 325 MG PO TABS
650.0000 mg | ORAL_TABLET | Freq: Once | ORAL | Status: AC
  Administered 2023-09-06: 650 mg via ORAL
  Filled 2023-09-06: qty 2

## 2023-09-06 MED ORDER — FAMOTIDINE IN NACL 20-0.9 MG/50ML-% IV SOLN
20.0000 mg | Freq: Once | INTRAVENOUS | Status: AC
  Administered 2023-09-06: 20 mg via INTRAVENOUS
  Filled 2023-09-06: qty 50

## 2023-09-06 MED ORDER — SODIUM CHLORIDE 0.9 % IV SOLN
INTRAVENOUS | Status: DC
Start: 2023-09-06 — End: 2023-09-06

## 2023-09-06 MED ORDER — SODIUM CHLORIDE 0.9 % IV SOLN
950.0000 mg | Freq: Once | INTRAVENOUS | Status: AC
  Administered 2023-09-06: 950 mg via INTRAVENOUS
  Filled 2023-09-06: qty 19

## 2023-09-06 MED ORDER — CETIRIZINE HCL 10 MG/ML IV SOLN
10.0000 mg | Freq: Once | INTRAVENOUS | Status: AC
  Administered 2023-09-06: 10 mg via INTRAVENOUS
  Filled 2023-09-06: qty 1

## 2023-09-06 MED ORDER — METHYLPREDNISOLONE SODIUM SUCC 125 MG IJ SOLR
125.0000 mg | Freq: Once | INTRAMUSCULAR | Status: AC
  Administered 2023-09-06: 125 mg via INTRAVENOUS
  Filled 2023-09-06: qty 2

## 2023-09-06 NOTE — Patient Instructions (Signed)
 [facility] [place] - A DEPT OF [facility]  Discharge Instructions: Thank you for choosing [facility] to provide your oncology and hematology care.  If you have a lab appointment with the Cancer Center - please note that after April 8th, 2024, all labs will be drawn in the cancer center.  You do not have to check in or register with the main entrance as you have in the past but will complete your check-in in the cancer center.  Wear comfortable clothing and clothing appropriate for easy access to any Portacath or PICC line.   We strive to give you quality time with your provider. You may need to reschedule your appointment if you arrive late (15 or more minutes).  Arriving late affects you and other patients whose appointments are after yours.  Also, if you miss three or more appointments without notifying the office, you may be dismissed from the clinic at the provider's discretion.      For prescription refill requests, have your pharmacy contact our office and allow 72 hours for refills to be completed.    Today you received the following Infed, return as scheduled.   To help prevent nausea and vomiting after your treatment, we encourage you to take your nausea medication as directed.  BELOW ARE SYMPTOMS THAT SHOULD BE REPORTED IMMEDIATELY: *FEVER GREATER THAN 100.4 F (38 C) OR HIGHER *CHILLS OR SWEATING *NAUSEA AND VOMITING THAT IS NOT CONTROLLED WITH YOUR NAUSEA MEDICATION *UNUSUAL SHORTNESS OF BREATH *UNUSUAL BRUISING OR BLEEDING *URINARY PROBLEMS (pain or burning when urinating, or frequent urination) *BOWEL PROBLEMS (unusual diarrhea, constipation, pain near the anus) TENDERNESS IN MOUTH AND THROAT WITH OR WITHOUT PRESENCE OF ULCERS (sore throat, sores in mouth, or a toothache) UNUSUAL RASH, SWELLING OR PAIN  UNUSUAL VAGINAL DISCHARGE OR ITCHING   Items with * indicate a potential emergency and should be followed up as soon as possible or go  to the Emergency Department if any problems should occur.  Please show the CHEMOTHERAPY ALERT CARD or IMMUNOTHERAPY ALERT CARD at check-in to the Emergency Department and triage nurse.  Should you have questions after your visit or need to cancel or reschedule your appointment, please contact [facility] [place] - A DEPT OF [facility] [phone]  and follow the prompts.  Office hours are 8:00 a.m. to 4:30 p.m. Monday - Friday. Please note that voicemails left after 4:00 p.m. may not be returned until the following business day.  We are closed weekends and major holidays. You have access to a nurse at all times for urgent questions. Please call the main number to the clinic [phone] and follow the prompts.  For any non-urgent questions, you may also contact your provider using MyChart. We now offer e-Visits for anyone 16 and older to request care online for non-urgent symptoms. For details visit [email or web address].   Also download the MyChart app! Go to the app store, search "MyChart", open the app, select , and log in with your MyChart username and password.

## 2023-09-06 NOTE — Progress Notes (Signed)
Patient tolerated iron infusion with no complaints voiced.  Peripheral IV site clean and dry with good blood return noted before and after infusion.  Band aid applied.  VSS with discharge and left in satisfactory condition with no s/s of distress noted.   

## 2023-09-10 ENCOUNTER — Ambulatory Visit: Payer: Medicare HMO | Attending: Cardiology

## 2023-09-10 DIAGNOSIS — I350 Nonrheumatic aortic (valve) stenosis: Secondary | ICD-10-CM | POA: Diagnosis not present

## 2023-09-10 LAB — ECHOCARDIOGRAM COMPLETE
AR max vel: 1 cm2
AV Area VTI: 1 cm2
AV Area mean vel: 1 cm2
AV Mean grad: 34 mm[Hg]
AV Peak grad: 53.2 mm[Hg]
Ao pk vel: 3.65 m/s
Area-P 1/2: 2.75 cm2
Calc EF: 61.8 %
MV VTI: 2.64 cm2
S' Lateral: 2.6 cm
Single Plane A2C EF: 54.8 %
Single Plane A4C EF: 68.3 %

## 2023-10-08 ENCOUNTER — Telehealth: Payer: Self-pay | Admitting: *Deleted

## 2023-10-08 NOTE — Telephone Encounter (Signed)
Notified, copy to pcp.

## 2023-10-08 NOTE — Telephone Encounter (Signed)
-----   Message from Dina Rich sent at 10/01/2023  3:14 PM EST ----- Aortic valve remains just moderately stiff, just something for [place] to continue to monitor at this time  Dominga Ferry MD

## 2023-10-11 ENCOUNTER — Inpatient Hospital Stay: Payer: Medicare HMO | Attending: Hematology

## 2023-10-11 DIAGNOSIS — K31811 Angiodysplasia of stomach and duodenum with bleeding: Secondary | ICD-10-CM | POA: Diagnosis present

## 2023-10-11 DIAGNOSIS — D500 Iron deficiency anemia secondary to blood loss (chronic): Secondary | ICD-10-CM | POA: Insufficient documentation

## 2023-10-11 DIAGNOSIS — D508 Other iron deficiency anemias: Secondary | ICD-10-CM

## 2023-10-11 LAB — CBC WITH DIFFERENTIAL/PLATELET
Abs Immature Granulocytes: 0.02 10*3/uL (ref 0.00–0.07)
Basophils Absolute: 0.1 10*3/uL (ref 0.0–0.1)
Basophils Relative: 1 %
Eosinophils Absolute: 0.5 10*3/uL (ref 0.0–0.5)
Eosinophils Relative: 6 %
HCT: 38.6 % — ABNORMAL LOW (ref 39.0–52.0)
Hemoglobin: 12.7 g/dL — ABNORMAL LOW (ref 13.0–17.0)
Immature Granulocytes: 0 %
Lymphocytes Relative: 17 %
Lymphs Abs: 1.4 10*3/uL (ref 0.7–4.0)
MCH: 32.1 pg (ref 26.0–34.0)
MCHC: 32.9 g/dL (ref 30.0–36.0)
MCV: 97.5 fL (ref 80.0–100.0)
Monocytes Absolute: 0.7 10*3/uL (ref 0.1–1.0)
Monocytes Relative: 9 %
Neutro Abs: 5.5 10*3/uL (ref 1.7–7.7)
Neutrophils Relative %: 67 %
Platelets: 167 10*3/uL (ref 150–400)
RBC: 3.96 MIL/uL — ABNORMAL LOW (ref 4.22–5.81)
RDW: 12.9 % (ref 11.5–15.5)
WBC: 8.1 10*3/uL (ref 4.0–10.5)
nRBC: 0 % (ref 0.0–0.2)

## 2023-10-11 LAB — IRON AND TIBC
Iron: 64 ug/dL (ref 45–182)
Saturation Ratios: 21 % (ref 17.9–39.5)
TIBC: 308 ug/dL (ref 250–450)
UIBC: 244 ug/dL

## 2023-10-11 LAB — FERRITIN: Ferritin: 39 ng/mL (ref 24–336)

## 2023-10-15 ENCOUNTER — Ambulatory Visit: Payer: Medicare HMO | Admitting: Gastroenterology

## 2023-10-17 ENCOUNTER — Ambulatory Visit: Payer: Medicare HMO | Admitting: Internal Medicine

## 2023-10-17 ENCOUNTER — Encounter: Payer: Self-pay | Admitting: Internal Medicine

## 2023-10-17 VITALS — BP 138/83 | HR 92 | Temp 98.7°F | Ht 67.0 in | Wt 182.2 lb

## 2023-10-17 DIAGNOSIS — K5521 Angiodysplasia of colon with hemorrhage: Secondary | ICD-10-CM

## 2023-10-17 DIAGNOSIS — K5904 Chronic idiopathic constipation: Secondary | ICD-10-CM

## 2023-10-17 DIAGNOSIS — D500 Iron deficiency anemia secondary to blood loss (chronic): Secondary | ICD-10-CM | POA: Diagnosis not present

## 2023-10-17 DIAGNOSIS — K5900 Constipation, unspecified: Secondary | ICD-10-CM | POA: Diagnosis not present

## 2023-10-17 DIAGNOSIS — K31819 Angiodysplasia of stomach and duodenum without bleeding: Secondary | ICD-10-CM

## 2023-10-17 NOTE — Patient Instructions (Addendum)
I happy to hear you are doing better. Your hemoglobin and iron levels look much better. We will continue to monitor. Follow up in 3-4 months or sooner if needed.   Continue on clearlax for your constipation.   It was very nice to see you today.   Dr. Marletta Lor

## 2023-10-17 NOTE — Progress Notes (Addendum)
Referring Provider: Benita Stabile, MD Primary Care Physician:  Benita Stabile, MD Primary GI:  Dr. Marletta Lor  Chief Complaint  Patient presents with   Follow-up    Following up on anemia. Pt states he is gradually feeling better    HPI:   [name] is a 78 y.o. male who presents to the clinic today for follow up visit.   Iron deficiency anemia:  Hospitalization in January 2024, stool Hemoccult positive at that time. Found to have ferritin of 11, B12 156. Required blood transfusion. At discharge his hemoglobin was 7.8. Chronically on Celebrex, cannot function without it. Also on daily aspirin 81 mg, Plavix 75 mg daily. Completed EGD/TCS as outlined below. Confirmed h.pylori eradication 12/2022 via stool antigen.    When presented 08/09/23, his Hemoglobin 6.5 down from 9.6 on November 2. MCV 105.1.  Received 1 unit of packed red blood cells.  Hemoglobin up to 7.7 on November 23. EGD completed, two small bowel AVMS treated.  Previously on ASA, PLAVIX, and Celebrex, now currently only on ASA 81 mg daily.   Blood work 10/11/23: Hgb 12.7, iron 64, ferritin 39. Following with hematology.   States his strength is returning slowly, breathing improved. No melena or hematochezia. Appetite good.   Prior endoscopic workup: EGD November 2024 -Duodenal AVMs x 2 ablated  Colonoscopy 09/21/2022: - Diverticulosis in the sigmoid colon, in the descending colon and at the splenic flexure. - One 10 mm polyp in the cecum, removed with a cold snare. Resected and retrieved.  - The examination was otherwise normal on direct and retroflexion views. Cannot rule out a diverticular bleed here. -Pathology revealed tubular adenomas -Recommend repeat in 3 years   EGD 09/21/2022: - Normal esophagus.  - Abnormal gastric mucosa of uncertain significance (Reticulated, mottled appearance of the gastric mucosa diffusely), status post biopsy.  - Normal duodenal bulb and second portion of the duodenum. -Biopsies  revealed H. pylori gastritis -Patient treated with Pylera   Colonoscopy July 2015: -2 sessile polyps ranging 3 to 5 mm in the cecum and rectum (rectal polyp removed but not cecal polyp given on Plavix) -Moderate to severe diverticulosis in the sigmoid colon -Complete resolution of inflammatory changes seen on CT scan -Cecal biopsy revealed to be a tubular adenoma, rectal polyp revealed to be hyperplastic   Past Medical History:  Diagnosis Date   Anemia    Iron Deficiency   Bilateral renal cysts    Bladder cancer (HCC) UROLOGIST-  DR [facility]   RECURRENT 11/ 2017 --  hx turbt in 2004 and 08-19-2013   BPH without obstruction/lower urinary tract symptoms    Cervical fusion syndrome    LEFT ARM NUMBNESS--  CONTROLLED W/ GABAPENTIN   Complication of anesthesia    Coronary artery disease    CARDIOLOGIST-- DR Wyline Mood Lorelle Formosa)   DDD (degenerative disc disease), cervical    DDD (degenerative disc disease), lumbar    Diverticulosis of sigmoid colon    Heart murmur    slight murmur per patient   History of acute gouty arthritis    History of adenomatous polyp of colon    2001;  2012- non-malignant leiomyoma;  04-07-2014  tubualr adenoma and hyperplastic polyp's   History of prostate cancer UROLOGIST-  DR [facility]   S/P  RADIOACTIVE SEED IMPLANTS 2004   HOH (hard of hearing)    no hearing aids   Hyperlipidemia    Hypertension    OA (osteoarthritis)    Occlusion and stenosis of  carotid artery without mention of cerebral infarction CARDIOLOGIST  -- DR  Christiane Ha BRANCH   LAST DUPLEX  11-23-2015  RICA  (DUPLEX DOPPLER 01-20-2016 RICA 60-79% PROXIMAL) /   LICA <50% (HAD CONSULT W/ DR Imogene Burn , VASCULAR SURGEON 01-20-2016)   OSA on CPAP    BiPAP   Peripheral vascular disease (HCC)    Carotid Artery Stenosis   PONV (postoperative nausea and vomiting)    Prostate cancer (HCC)    per patient "years ago"- had radiation implant   Spondylosis, cervical    Type 2 diabetes mellitus (HCC)     Wears glasses     Past Surgical History:  Procedure Laterality Date   ANTERIOR REMOVAL CAGE AND PLATE W4-X3/ CORPECTOMY C7 (FX)/  ALLOGRAFT AND FUSION C3 -- T1/  POSTERIOR DECOMPRESSION BILATERAL LAMINECTOMY C4 -- 6 & PARTIAL C3/  POSTEROLATERAL ARTHRODESIS C3 - T1  06/05/2005   APPENDECTOMY  1990'S   BIOPSY  09/21/2022   Procedure: BIOPSY;  Surgeon: Corbin Ade, MD;  [facility];  Service: Endoscopy;;   CATARACT EXTRACTION W/ INTRAOCULAR LENS  IMPLANT, BILATERAL  2013   CERVICAL FUSION  05/11/2005   C3  --  C7/  due to post op quadriparesis same day s/p  anterior C 4,5,6, colpectomy, decompression, removal epidural hematoma, foraminnotomy, cage and plate   COLONOSCOPY  last one 04-07-2014   COLONOSCOPY WITH PROPOFOL N/A 09/21/2022   Procedure: COLONOSCOPY WITH PROPOFOL;  Surgeon: Corbin Ade, MD;  [facility];  Service: Endoscopy;  Laterality: N/A;   CYSTOSCOPY W/ RETROGRADES Bilateral 08/19/2013   Procedure: CYSTOSCOPY WITH RETROGRADE PYELOGRAM;  Surgeon: Milford Cage, MD;  Location: [facility];  Service: Urology;  Laterality: Bilateral;   CYSTOSCOPY W/ RETROGRADES Bilateral 07/18/2016   Procedure: CYSTOSCOPY WITH RETROGRADE PYELOGRAM;  Surgeon: Sebastian Ache, MD;  [facility];  Service: Urology;  Laterality: Bilateral;   ENDOSCOPIC REPAIR CSF LEAK VIA NASAL PASSAGE  2011   ESOPHAGOGASTRODUODENOSCOPY (EGD) WITH PROPOFOL N/A 09/21/2022   Procedure: ESOPHAGOGASTRODUODENOSCOPY (EGD) WITH PROPOFOL;  Surgeon: Corbin Ade, MD;  [facility];  Service: Endoscopy;  Laterality: N/A;   ESOPHAGOGASTRODUODENOSCOPY (EGD) WITH PROPOFOL N/A 08/09/2023   Procedure: ESOPHAGOGASTRODUODENOSCOPY (EGD) WITH PROPOFOL;  Surgeon: Corbin Ade, MD;  [facility];  Service: Endoscopy;  Laterality: N/A;   HEMOSTASIS CLIP PLACEMENT  09/21/2022   Procedure: HEMOSTASIS CLIP PLACEMENT;  Surgeon: Corbin Ade, MD;   [facility];  Service: Endoscopy;;   HOT HEMOSTASIS  08/09/2023   Procedure: HOT HEMOSTASIS (ARGON PLASMA COAGULATION/BICAP);  Surgeon: Corbin Ade, MD;  [facility];  Service: Endoscopy;;   INTRAOPERATIVE ARTERIOGRAM  CATH LAB  01-05-2009  DR Jacinto Halim   RICA   ACUTE ANGLE 80-85% STENOSIS   KNEE ARTHROSCOPY Left 07/15/2017   Procedure: ARTHROSCOPY LEFT  KNEE AND DEBRIDEMENT, medial meniscal tear and chondromalaita;  Surgeon: Durene Romans, MD;  Location: [facility];  Service: Orthopedics;  Laterality: Left;  60 MINS   LUMBAR FUSION  03-26-2012   L3 --  L5   POLYPECTOMY  09/21/2022   Procedure: POLYPECTOMY;  Surgeon: Corbin Ade, MD;  [facility];  Service: Endoscopy;;   RADIOACTIVE PROSTATE SEED IMPLANTS  08-19-2003   SEPTOPLASTY  1998   TOTAL KNEE ARTHROPLASTY Left 02/25/2018   Procedure: LEFT TOTAL KNEE ARTHROPLASTY;  Surgeon: Durene Romans, MD;  Location: [facility];  Service: Orthopedics;  Laterality: Left;  70 mins  TRANSCAROTID ARTERY REVASCULARIZATION  Right 05/11/2022   Procedure: Right Transcarotid Artery Revascularization;  Surgeon: Maeola Harman, MD;  Location: [facility] OR;  Service: Vascular;  Laterality: Right;   TRANSCAROTID ARTERY REVASCULARIZATION  Left 07/19/2023   Procedure: LEFT TRANSCAROTID ARTERY REVASCULARIZATION;  Surgeon: Maeola Harman, MD;  Location: [facility] OR;  Service: Vascular;  Laterality: Left;   TRANSURETHRAL RESECTION OF BLADDER TUMOR N/A 08/19/2013   Procedure: TRANSURETHRAL RESECTION OF BLADDER TUMOR (TURBT);  Surgeon: Milford Cage, MD;  Location: [facility];  Service: Urology;  Laterality: N/A;   TRANSURETHRAL RESECTION OF BLADDER TUMOR N/A 07/18/2016   Procedure: TRANSURETHRAL RESECTION OF BLADDER TUMOR (TURBT);  Surgeon: Sebastian Ache, MD;  Location: [facility];  Service: Urology;  Laterality: N/A;   ULTRASOUND GUIDANCE FOR VASCULAR ACCESS Right 07/19/2023   Procedure:  ULTRASOUND GUIDANCE FOR VASCULAR ACCESS, RIGHT FEMORAL VEIN;  Surgeon: Maeola Harman, MD;  Location: [facility] OR;  Service: Vascular;  Laterality: Right;   YAG LASER APPLICATION  07/29/2012   Procedure: YAG LASER APPLICATION;  Surgeon: Loraine Leriche T. Nile Riggs, MD;  Location: [facility];  Service: Ophthalmology;  Laterality: Right;    Current Outpatient Medications  Medication Sig Dispense Refill   acetaminophen (TYLENOL) 500 MG tablet Take 1,000 mg by mouth every 6 (six) hours as needed for moderate pain.     albuterol (VENTOLIN HFA) 108 (90 Base) MCG/ACT inhaler Inhale 2 puffs into the lungs every 4 (four) hours as needed for shortness of breath or wheezing.     allopurinol (ZYLOPRIM) 300 MG tablet Take 300 mg by mouth at bedtime.     amLODipine (NORVASC) 10 MG tablet TAKE 1 TABLET BY MOUTH EVERY DAY 90 tablet 3   aspirin EC 81 MG tablet Take 1 tablet (81 mg total) by mouth daily with breakfast. 30 tablet 12   Budeson-Glycopyrrol-Formoterol (BREZTRI AEROSPHERE) 160-9-4.8 MCG/ACT AERO Inhale 1 puff into the lungs daily.     colchicine 0.6 MG tablet Take 0.6 mg by mouth 2 (two) times daily as needed (gout).      cyanocobalamin (VITAMIN B12) 1000 MCG tablet Take 1 tablet (1,000 mcg total) by mouth daily. 30 tablet 1   docusate sodium (COLACE) 100 MG capsule Take 100 mg by mouth at bedtime.     ferrous sulfate 325 (65 FE) MG EC tablet Take 325 mg by mouth daily.     finasteride (PROSCAR) 5 MG tablet Take 5 mg by mouth at bedtime.     furosemide (LASIX) 40 MG tablet Take 40 mg by mouth every morning.     gabapentin (NEURONTIN) 300 MG capsule Take 300 mg by mouth 2 (two) times daily.     Iron-FA-B Cmp-C-Biot-Probiotic (FUSION PLUS) CAPS Take 1 capsule by mouth daily.     labetalol (NORMODYNE) 100 MG tablet Take 100 mg by mouth at bedtime.     losartan (COZAAR) 100 MG tablet Take 1 tablet (100 mg total) by mouth every evening. (Patient taking differently: Take 100 mg by mouth in the morning.) 30 tablet 6    metFORMIN (GLUCOPHAGE-XR) 500 MG 24 hr tablet Take 1 tablet (500 mg total) by mouth in the morning and at bedtime.     NON FORMULARY Pt uses cpap     Omega-3-6-9 CAPS Take 1 capsule by mouth 2 (two) times daily.     pantoprazole (PROTONIX) 40 MG tablet Take 1 tablet (40 mg total) by mouth 2 (two) times daily. 180 tablet 3   polyethylene glycol powder (GLYCOLAX/MIRALAX) powder TAKE  17MG (1CAPFUL) BY MOUTH(MIXED WITH WATER/JUICE) DAILY 527 g 0   rosuvastatin (CRESTOR) 10 MG tablet Take 10 mg by mouth every morning.      sucralfate (CARAFATE) 1 GM/10ML suspension Take 10 mLs (1 g total) by mouth 4 (four) times daily. 420 mL 0   tamsulosin (FLOMAX) 0.4 MG CAPS capsule Take 0.4 mg by mouth daily.     Vitamin D, Ergocalciferol, (DRISDOL) 1.25 MG (50000 UNIT) CAPS capsule Take 50,000 Units by mouth every Thursday.     No current facility-administered medications for this visit.    Allergies as of 10/17/2023 - Review Complete 10/17/2023  Allergen Reaction Noted   Dilaudid [hydromorphone hcl] Other (See Comments) 04/06/2014   Lyrica [pregabalin] Nausea Only and Other (See Comments)    Toradol [ketorolac tromethamine] Other (See Comments) 02/04/2014   Tramadol Other (See Comments) 05/02/2022    Family History  Problem Relation Age of Onset   Breast cancer Mother        mets   Diabetes Mother    Heart disease Father        MI   Colon cancer Brother    Cancer Sister        male organs   Diabetes Sister        family hx   Arthritis Other        entire family   Diabetes Brother        x 2    Social History   Socioeconomic History   Marital status: Widowed    Spouse name: Not on file   Number of children: 2   Years of education: Not on file   Highest education level: Not on file  Occupational History   Occupation: retired//disabled  Tobacco Use   Smoking status: Every Day    Current packs/day: 1.00    Average packs/day: 1 pack/day for 47.0 years (47.0 ttl pk-yrs)    Types:  Cigarettes   Smokeless tobacco: Never   Tobacco comments:    pt quits smoking periodically :: started smoking again Jan 2018 1ppd  Vaping Use   Vaping status: Never Used  Substance and Sexual Activity   Alcohol use: No    Alcohol/week: 0.0 standard drinks of alcohol   Drug use: No   Sexual activity: Not Currently  Other Topics Concern   Not on file  Social History Narrative   Not on file   Social Drivers of Health   Financial Resource Strain: Not on file  Food Insecurity: No Food Insecurity (08/09/2023)   Hunger Vital Sign    Worried About Running Out of Food in the Last Year: Never true    Ran Out of Food in the Last Year: Never true  Transportation Needs: No Transportation Needs (08/09/2023)   PRAPARE - Administrator, Civil Service (Medical): No    Lack of Transportation (Non-Medical): No  Physical Activity: Not on file  Stress: Not on file  Social Connections: Not on file    Subjective: Review of Systems  Constitutional:  Positive for malaise/fatigue. Negative for chills and fever.  HENT:  Negative for congestion and hearing loss.   Eyes:  Negative for blurred vision and double vision.  Respiratory:  Negative for cough and shortness of breath.   Cardiovascular:  Negative for chest pain and palpitations.  Gastrointestinal:  Negative for abdominal pain, blood in stool, constipation, diarrhea, heartburn, melena and vomiting.  Genitourinary:  Negative for dysuria and urgency.  Musculoskeletal:  Negative for joint pain and myalgias.  Skin:  Negative for itching and rash.  Neurological:  Negative for dizziness and headaches.  Psychiatric/Behavioral:  Negative for depression. The patient is not nervous/anxious.      Objective: BP 138/83   Pulse 92   Temp 98.7 F (37.1 C)   Ht 5\' 7"  (1.702 m)   Wt 182 lb 3.2 oz (82.6 kg)   BMI 28.54 kg/m  Physical Exam Constitutional:      Appearance: Normal appearance.  HENT:     Head: Normocephalic and atraumatic.   Eyes:     Extraocular Movements: Extraocular movements intact.     Conjunctiva/sclera: Conjunctivae normal.  Cardiovascular:     Rate and Rhythm: Normal rate and regular rhythm.     Heart sounds: Murmur heard.  Pulmonary:     Effort: Pulmonary effort is normal.     Breath sounds: Normal breath sounds.  Abdominal:     General: Bowel sounds are normal.     Palpations: Abdomen is soft.  Musculoskeletal:        General: Normal range of motion.     Cervical back: Normal range of motion and neck supple.  Skin:    General: Skin is warm.  Neurological:     General: No focal deficit present.     Mental Status: He is alert and oriented to person, place, and time.  Psychiatric:        Mood and Affect: Mood normal.        Behavior: Behavior normal.      Assessment: *Iron deficiency anemia due to chronic blood loss *Small bowel ectasias *Constipation, mild. Well controlled on Miralax qhs  Plan: Patient appears to be doing well in office today. Hgb and iron improved, continue fu with hematology. No longer on Plavix which should hopefully help keep his Hgb stable.   Constipation well controlled on Miralax, will continue  Follow up in 3-4 months or sooner if needed.   10/17/2023 10:44 AM   Disclaimer: This note was dictated with voice recognition software. Similar sounding words can inadvertently be transcribed and may not be corrected upon review.

## 2023-10-18 ENCOUNTER — Inpatient Hospital Stay: Payer: Medicare HMO | Admitting: Oncology

## 2023-10-18 VITALS — BP 106/53 | HR 73 | Temp 98.3°F | Resp 17

## 2023-10-18 DIAGNOSIS — D500 Iron deficiency anemia secondary to blood loss (chronic): Secondary | ICD-10-CM | POA: Diagnosis not present

## 2023-10-18 DIAGNOSIS — D508 Other iron deficiency anemias: Secondary | ICD-10-CM

## 2023-10-18 NOTE — Progress Notes (Signed)
[facility] [address], [place] [address]   Clinic Day:  10/18/2023  Referring physician: Benita Stabile, MD  Patient Care Team: Benita Stabile, MD as PCP - General (Internal Medicine) Wyline Mood Dorothe Pea, MD as PCP - Cardiology (Cardiology)   ASSESSMENT & PLAN:   Assessment:  1.  Severe iron deficiency anemia: - Recent admission from 08/09/2023 through 08/10/2023 with hemoglobin 6.5, status post 1 unit PRBC.  EGD with 2 duodenal AVMs cauterized. - Previous admission to the hospital in January 2024, s/p 1 unit PRBC. - Has been on fusion plus iron tablet from the beginning of the year. - Colonoscopy (09/21/2022): Diverticulosis in the sigmoid colon, descending colon and splenic flexure.  10 mm tubular adenoma of the cecum removed.  2.  Social/family history: - Lives at home with his daughter-in-law.  Independent of ADLs and IADLs. - Current active smoker, 1 pack/day started at age 66. - Sister had colon cancer.  Mother had breast cancer and brother had liver/lung cancer.  Plan:  1.  Severe iron deficiency anemia: - Received 1 g INFeD on 09/06/2023 with great tolerance. -Labs from 10/11/2023 show significant improvement of his hemoglobin up to 12.7 (8.6), MCV 97.5, ferritin 39 (21), iron saturation 21% and TIBC 308. - We discussed continuing oral iron supplements but also potentially 1 additional dose of IV iron given ferritin and iron saturations are not where we want them.  He was agreeable. -Recommend follow-up in approximately 3 months with labs a few days before. -Patient is aware to call us if there are any significant changes to how he is feeling and/or if he is having darker than usual stools.   Orders Placed This Encounter  Procedures   CBC with Differential    Standing Status:   Future    Expected Date:   01/15/2024    Expiration Date:   10/17/2024   Iron and TIBC ([facility] ONLY)    Standing Status:   Future    Expected Date:    01/15/2024    Expiration Date:   10/17/2024   Ferritin    Standing Status:   Future    Expected Date:   01/15/2024    Expiration Date:   10/17/2024    Mauro Kaufmann, NP   1/31/20251:28 PM  CHIEF COMPLAINT/PURPOSE OF CONSULT:   Diagnosis: Iron deficiency anemia from blood loss  Current Therapy: INFeD  HISTORY OF PRESENT ILLNESS:   [name] is a 78 y.o. male presenting to clinic today for follow-up for iron deficiency anemia.  He was last seen by Dr. Ellin Saba on 09/04/2023 for initial workup.  Patient denies any recurrent dark stools.  He is taking an iron supplement and tolerating well.  Appetite is 100% energy levels are 50%.  Feels like he is slowly starting to improve after the IV iron.  Has chronic shortness of breath secondary to COPD and smoking.  He saw gastroenterology yesterday and overall got a good report.    PAST MEDICAL HISTORY:   Past Medical History: Past Medical History:  Diagnosis Date   Anemia    Iron Deficiency   Bilateral renal cysts    Bladder cancer (HCC) UROLOGIST-  DR [facility]   RECURRENT 11/ 2017 --  hx turbt in 2004 and 08-19-2013   BPH without obstruction/lower urinary tract symptoms    Cervical fusion syndrome    LEFT ARM NUMBNESS--  CONTROLLED W/ GABAPENTIN   Complication of anesthesia    Coronary artery disease  CARDIOLOGIST-- DR Wyline Mood Lorelle Formosa)   DDD (degenerative disc disease), cervical    DDD (degenerative disc disease), lumbar    Diverticulosis of sigmoid colon    Heart murmur    slight murmur per patient   History of acute gouty arthritis    History of adenomatous polyp of colon    2001;  2012- non-malignant leiomyoma;  04-07-2014  tubualr adenoma and hyperplastic polyp's   History of prostate cancer UROLOGIST-  DR [facility]   S/P  RADIOACTIVE SEED IMPLANTS 2004   HOH (hard of hearing)    no hearing aids   Hyperlipidemia    Hypertension    OA (osteoarthritis)    Occlusion and stenosis of carotid artery without mention of  cerebral infarction CARDIOLOGIST  -- DR  Christiane Ha BRANCH   LAST DUPLEX  11-23-2015  RICA  (DUPLEX DOPPLER 01-20-2016 RICA 60-79% PROXIMAL) /   LICA <50% (HAD CONSULT W/ DR Imogene Burn , VASCULAR SURGEON 01-20-2016)   OSA on CPAP    BiPAP   Peripheral vascular disease (HCC)    Carotid Artery Stenosis   PONV (postoperative nausea and vomiting)    Prostate cancer (HCC)    per patient "years ago"- had radiation implant   Spondylosis, cervical    Type 2 diabetes mellitus (HCC)    Wears glasses     Surgical History: Past Surgical History:  Procedure Laterality Date   ANTERIOR REMOVAL CAGE AND PLATE Z6-X0/ CORPECTOMY C7 (FX)/  ALLOGRAFT AND FUSION C3 -- T1/  POSTERIOR DECOMPRESSION BILATERAL LAMINECTOMY C4 -- 6 & PARTIAL C3/  POSTEROLATERAL ARTHRODESIS C3 - T1  06/05/2005   APPENDECTOMY  1990'S   BIOPSY  09/21/2022   Procedure: BIOPSY;  Surgeon: Corbin Ade, MD;  [facility];  Service: Endoscopy;;   CATARACT EXTRACTION W/ INTRAOCULAR LENS  IMPLANT, BILATERAL  2013   CERVICAL FUSION  05/11/2005   C3  --  C7/  due to post op quadriparesis same day s/p  anterior C 4,5,6, colpectomy, decompression, removal epidural hematoma, foraminnotomy, cage and plate   COLONOSCOPY  last one 04-07-2014   COLONOSCOPY WITH PROPOFOL N/A 09/21/2022   Procedure: COLONOSCOPY WITH PROPOFOL;  Surgeon: Corbin Ade, MD;  [facility];  Service: Endoscopy;  Laterality: N/A;   CYSTOSCOPY W/ RETROGRADES Bilateral 08/19/2013   Procedure: CYSTOSCOPY WITH RETROGRADE PYELOGRAM;  Surgeon: Milford Cage, MD;  Location: [facility];  Service: Urology;  Laterality: Bilateral;   CYSTOSCOPY W/ RETROGRADES Bilateral 07/18/2016   Procedure: CYSTOSCOPY WITH RETROGRADE PYELOGRAM;  Surgeon: Sebastian Ache, MD;  Location: [facility];  Service: Urology;  Laterality: Bilateral;   ENDOSCOPIC REPAIR CSF LEAK VIA NASAL PASSAGE  2011   ESOPHAGOGASTRODUODENOSCOPY (EGD) WITH PROPOFOL N/A  09/21/2022   Procedure: ESOPHAGOGASTRODUODENOSCOPY (EGD) WITH PROPOFOL;  Surgeon: Corbin Ade, MD;  [facility];  Service: Endoscopy;  Laterality: N/A;   ESOPHAGOGASTRODUODENOSCOPY (EGD) WITH PROPOFOL N/A 08/09/2023   Procedure: ESOPHAGOGASTRODUODENOSCOPY (EGD) WITH PROPOFOL;  Surgeon: Corbin Ade, MD;  [facility];  Service: Endoscopy;  Laterality: N/A;   HEMOSTASIS CLIP PLACEMENT  09/21/2022   Procedure: HEMOSTASIS CLIP PLACEMENT;  Surgeon: Corbin Ade, MD;  [facility];  Service: Endoscopy;;   HOT HEMOSTASIS  08/09/2023   Procedure: HOT HEMOSTASIS (ARGON PLASMA COAGULATION/BICAP);  Surgeon: Corbin Ade, MD;  [facility];  Service: Endoscopy;;   INTRAOPERATIVE ARTERIOGRAM  CATH LAB  01-05-2009  DR Jacinto Halim   RICA   ACUTE ANGLE  80-85% STENOSIS   KNEE ARTHROSCOPY Left 07/15/2017   Procedure: ARTHROSCOPY LEFT  KNEE AND DEBRIDEMENT, medial meniscal tear and chondromalaita;  Surgeon: Durene Romans, MD;  Location: [facility];  Service: Orthopedics;  Laterality: Left;  60 MINS   LUMBAR FUSION  03-26-2012   L3 --  L5   POLYPECTOMY  09/21/2022   Procedure: POLYPECTOMY;  Surgeon: Corbin Ade, MD;  [facility];  Service: Endoscopy;;   RADIOACTIVE PROSTATE SEED IMPLANTS  08-19-2003   SEPTOPLASTY  1998   TOTAL KNEE ARTHROPLASTY Left 02/25/2018   Procedure: LEFT TOTAL KNEE ARTHROPLASTY;  Surgeon: Durene Romans, MD;  Location: [facility];  Service: Orthopedics;  Laterality: Left;  70 mins   TRANSCAROTID ARTERY REVASCULARIZATION  Right 05/11/2022   Procedure: Right Transcarotid Artery Revascularization;  Surgeon: Maeola Harman, MD;  Location: [facility];  Service: Vascular;  Laterality: Right;   TRANSCAROTID ARTERY REVASCULARIZATION  Left 07/19/2023   Procedure: LEFT TRANSCAROTID ARTERY REVASCULARIZATION;  Surgeon: Maeola Harman, MD;  Location: [facility] OR;  Service: Vascular;  Laterality: Left;   TRANSURETHRAL RESECTION OF BLADDER  TUMOR N/A 08/19/2013   Procedure: TRANSURETHRAL RESECTION OF BLADDER TUMOR (TURBT);  Surgeon: Milford Cage, MD;  Location: [facility];  Service: Urology;  Laterality: N/A;   TRANSURETHRAL RESECTION OF BLADDER TUMOR N/A 07/18/2016   Procedure: TRANSURETHRAL RESECTION OF BLADDER TUMOR (TURBT);  Surgeon: Sebastian Ache, MD;  Location: [facility];  Service: Urology;  Laterality: N/A;   ULTRASOUND GUIDANCE FOR VASCULAR ACCESS Right 07/19/2023   Procedure: ULTRASOUND GUIDANCE FOR VASCULAR ACCESS, RIGHT FEMORAL VEIN;  Surgeon: Maeola Harman, MD;  Location: [facility] OR;  Service: Vascular;  Laterality: Right;   YAG LASER APPLICATION  07/29/2012   Procedure: YAG LASER APPLICATION;  Surgeon: Loraine Leriche T. Nile Riggs, MD;  Location: [facility];  Service: Ophthalmology;  Laterality: Right;    Social History: Social History   Socioeconomic History   Marital status: Widowed    Spouse name: Not on file   Number of children: 2   Years of education: Not on file   Highest education level: Not on file  Occupational History   Occupation: retired//disabled  Tobacco Use   Smoking status: Every Day    Current packs/day: 1.00    Average packs/day: 1 pack/day for 47.0 years (47.0 ttl pk-yrs)    Types: Cigarettes   Smokeless tobacco: Never   Tobacco comments:    pt quits smoking periodically :: started smoking again Jan 2018 1ppd  Vaping Use   Vaping status: Never Used  Substance and Sexual Activity   Alcohol use: No    Alcohol/week: 0.0 standard drinks of alcohol   Drug use: No   Sexual activity: Not Currently  Other Topics Concern   Not on file  Social History Narrative   Not on file   Social Drivers of Health   Financial Resource Strain: Not on file  Food Insecurity: No Food Insecurity (08/09/2023)   Hunger Vital Sign    Worried About Running Out of Food in the Last Year: Never true    Ran Out of Food in the Last Year: Never true  Transportation Needs: No  Transportation Needs (08/09/2023)   PRAPARE - Administrator, Civil Service (Medical): No    Lack of Transportation (Non-Medical): No  Physical Activity: Not on file  Stress: Not on file  Social Connections: Not on file  Intimate Partner Violence: Not At Risk (08/09/2023)   Humiliation, Afraid, Rape, and Kick  questionnaire    Fear of Current or Ex-Partner: No    Emotionally Abused: No    Physically Abused: No    Sexually Abused: No    Family History: Family History  Problem Relation Age of Onset   Breast cancer Mother        mets   Diabetes Mother    Heart disease Father        MI   Colon cancer Brother    Cancer Sister        male organs   Diabetes Sister        family hx   Arthritis Other        entire family   Diabetes Brother        x 2    Current Medications:  Current Outpatient Medications:    acetaminophen (TYLENOL) 500 MG tablet, Take 1,000 mg by mouth every 6 (six) hours as needed for moderate pain., Disp: , Rfl:    albuterol (VENTOLIN HFA) 108 (90 Base) MCG/ACT inhaler, Inhale 2 puffs into the lungs every 4 (four) hours as needed for shortness of breath or wheezing., Disp: , Rfl:    allopurinol (ZYLOPRIM) 300 MG tablet, Take 300 mg by mouth at bedtime., Disp: , Rfl:    amLODipine (NORVASC) 10 MG tablet, TAKE 1 TABLET BY MOUTH EVERY DAY, Disp: 90 tablet, Rfl: 3   aspirin EC 81 MG tablet, Take 1 tablet (81 mg total) by mouth daily with breakfast., Disp: 30 tablet, Rfl: 12   Budeson-Glycopyrrol-Formoterol (BREZTRI AEROSPHERE) 160-9-4.8 MCG/ACT AERO, Inhale 1 puff into the lungs daily., Disp: , Rfl:    colchicine 0.6 MG tablet, Take 0.6 mg by mouth 2 (two) times daily as needed (gout). , Disp: , Rfl:    cyanocobalamin (VITAMIN B12) 1000 MCG tablet, Take 1 tablet (1,000 mcg total) by mouth daily., Disp: 30 tablet, Rfl: 1   docusate sodium (COLACE) 100 MG capsule, Take 100 mg by mouth at bedtime., Disp: , Rfl:    ferrous sulfate 325 (65 FE) MG EC tablet,  Take 325 mg by mouth daily., Disp: , Rfl:    finasteride (PROSCAR) 5 MG tablet, Take 5 mg by mouth at bedtime., Disp: , Rfl:    furosemide (LASIX) 40 MG tablet, Take 40 mg by mouth every morning., Disp: , Rfl:    gabapentin (NEURONTIN) 300 MG capsule, Take 300 mg by mouth 2 (two) times daily., Disp: , Rfl:    Iron-FA-B Cmp-C-Biot-Probiotic (FUSION PLUS) CAPS, Take 1 capsule by mouth daily., Disp: , Rfl:    labetalol (NORMODYNE) 100 MG tablet, Take 100 mg by mouth at bedtime., Disp: , Rfl:    losartan (COZAAR) 100 MG tablet, Take 1 tablet (100 mg total) by mouth every evening. (Patient taking differently: Take 100 mg by mouth in the morning.), Disp: 30 tablet, Rfl: 6   metFORMIN (GLUCOPHAGE-XR) 500 MG 24 hr tablet, Take 1 tablet (500 mg total) by mouth in the morning and at bedtime., Disp: , Rfl:    NON FORMULARY, Pt uses cpap, Disp: , Rfl:    Omega-3-6-9 CAPS, Take 1 capsule by mouth 2 (two) times daily., Disp: , Rfl:    pantoprazole (PROTONIX) 40 MG tablet, Take 1 tablet (40 mg total) by mouth 2 (two) times daily., Disp: 180 tablet, Rfl: 3   polyethylene glycol powder (GLYCOLAX/MIRALAX) powder, TAKE 17MG (1CAPFUL) BY MOUTH(MIXED WITH WATER/JUICE) DAILY, Disp: 527 g, Rfl: 0   rosuvastatin (CRESTOR) 10 MG tablet, Take 10 mg by mouth every morning. , Disp: ,  Rfl:    sucralfate (CARAFATE) 1 GM/10ML suspension, Take 10 mLs (1 g total) by mouth 4 (four) times daily., Disp: 420 mL, Rfl: 0   tamsulosin (FLOMAX) 0.4 MG CAPS capsule, Take 0.4 mg by mouth daily., Disp: , Rfl:    Vitamin D, Ergocalciferol, (DRISDOL) 1.25 MG (50000 UNIT) CAPS capsule, Take 50,000 Units by mouth every Thursday., Disp: , Rfl:    Allergies: Allergies  Allergen Reactions   Dilaudid [Hydromorphone Hcl] Other (See Comments)    "acts a little off"   Lyrica [Pregabalin] Nausea Only and Other (See Comments)    Make me feel "bad"   Toradol [Ketorolac Tromethamine] Other (See Comments)    Caused hallucination - states not sure /  avoids per his MD   Tramadol Other (See Comments)    Couldn't sleep    REVIEW OF SYSTEMS:   Review of Systems  Constitutional:  Negative for fatigue.  Respiratory:  Positive for shortness of breath.   Gastrointestinal:  Negative for abdominal pain, blood in stool, constipation and diarrhea.  Musculoskeletal:  Positive for arthralgias.     VITALS:   Blood pressure (!) 106/53, pulse 73, temperature 98.3 F (36.8 C), temperature source Oral, resp. rate 17, SpO2 98%.  Wt Readings from Last 3 Encounters:  10/17/23 182 lb 3.2 oz (82.6 kg)  09/04/23 181 lb 12.8 oz (82.5 kg)  08/28/23 180 lb 8 oz (81.9 kg)    There is no height or weight on file to calculate BMI.   PHYSICAL EXAM:   Physical Exam Constitutional:      Appearance: Normal appearance.  HENT:     Head: Normocephalic and atraumatic.  Eyes:     Pupils: Pupils are equal, round, and reactive to light.  Cardiovascular:     Rate and Rhythm: Normal rate and regular rhythm.     Heart sounds: Normal heart sounds. No murmur heard. Pulmonary:     Effort: Pulmonary effort is normal.     Breath sounds: Normal breath sounds. No wheezing.  Abdominal:     General: Bowel sounds are normal. There is no distension.     Palpations: Abdomen is soft.     Tenderness: There is no abdominal tenderness.  Musculoskeletal:        General: Normal range of motion.     Cervical back: Normal range of motion.  Skin:    General: Skin is warm and dry.     Findings: No rash.  Neurological:     Mental Status: He is alert and oriented to person, place, and time.  Psychiatric:        Judgment: Judgment normal.     LABS:      Latest Ref Rng & Units 10/11/2023   10:22 AM 08/20/2023    9:28 AM 08/10/2023    3:59 AM  CBC  WBC 4.0 - 10.5 K/uL 8.1  6.1  10.2   Hemoglobin 13.0 - 17.0 g/dL 16.1  8.6  7.7   Hematocrit 39.0 - 52.0 % 38.6  27.5  24.1   Platelets 150 - 400 K/uL 167  155  174       Latest Ref Rng & Units 08/10/2023    3:59 AM  08/09/2023   10:27 AM 07/20/2023    4:25 AM  CMP  Glucose 70 - 99 mg/dL 96  096  045   BUN 8 - 23 mg/dL 27  39  24   Creatinine 0.61 - 1.24 mg/dL 4.09  8.11  9.14   Sodium  135 - 145 mmol/L 143  137  138   Potassium 3.5 - 5.1 mmol/L 4.4  4.1  3.9   Chloride 98 - 111 mmol/L 107  105  105   CO2 22 - 32 mmol/L 26  25  25    Calcium 8.9 - 10.3 mg/dL 8.9  8.8  9.0      No results found for: "CEA1", "CEA" / No results found for: "CEA1", "CEA" No results found for: "PSA1" No results found for: "WUJ811" No results found for: "CAN125"  No results found for: "TOTALPROTELP", "ALBUMINELP", "A1GS", "A2GS", "BETS", "BETA2SER", "GAMS", "MSPIKE", "SPEI" Lab Results  Component Value Date   TIBC 308 10/11/2023   TIBC 340 08/20/2023   TIBC 329 09/19/2022   FERRITIN 39 10/11/2023   FERRITIN 21 (L) 08/20/2023   FERRITIN 29 07/10/2023   IRONPCTSAT 21 10/11/2023   IRONPCTSAT 50 (H) 08/20/2023   IRONPCTSAT 19 09/19/2022   No results found for: "LDH"   STUDIES:   No results found.

## 2023-10-23 ENCOUNTER — Inpatient Hospital Stay: Payer: Medicare HMO | Attending: Hematology

## 2023-10-23 VITALS — BP 168/72 | HR 83 | Temp 97.7°F | Resp 18

## 2023-10-23 DIAGNOSIS — D509 Iron deficiency anemia, unspecified: Secondary | ICD-10-CM | POA: Diagnosis present

## 2023-10-23 DIAGNOSIS — D500 Iron deficiency anemia secondary to blood loss (chronic): Secondary | ICD-10-CM

## 2023-10-23 MED ORDER — FAMOTIDINE IN NACL 20-0.9 MG/50ML-% IV SOLN
20.0000 mg | Freq: Once | INTRAVENOUS | Status: AC
  Administered 2023-10-23: 20 mg via INTRAVENOUS
  Filled 2023-10-23: qty 50

## 2023-10-23 MED ORDER — CETIRIZINE HCL 10 MG/ML IV SOLN
10.0000 mg | Freq: Once | INTRAVENOUS | Status: AC
  Administered 2023-10-23: 10 mg via INTRAVENOUS
  Filled 2023-10-23: qty 1

## 2023-10-23 MED ORDER — ACETAMINOPHEN 325 MG PO TABS
650.0000 mg | ORAL_TABLET | Freq: Once | ORAL | Status: AC
Start: 2023-10-23 — End: 2023-10-23
  Administered 2023-10-23: 650 mg via ORAL
  Filled 2023-10-23: qty 2

## 2023-10-23 MED ORDER — METHYLPREDNISOLONE SODIUM SUCC 125 MG IJ SOLR
125.0000 mg | Freq: Once | INTRAMUSCULAR | Status: AC
  Administered 2023-10-23: 125 mg via INTRAVENOUS
  Filled 2023-10-23: qty 2

## 2023-10-23 MED ORDER — SODIUM CHLORIDE 0.9 % IV SOLN
1000.0000 mg | Freq: Once | INTRAVENOUS | Status: AC
  Administered 2023-10-23: 1000 mg via INTRAVENOUS
  Filled 2023-10-23: qty 20

## 2023-10-23 MED ORDER — SODIUM CHLORIDE 0.9 % IV SOLN: INTRAVENOUS | Status: DC

## 2023-10-23 NOTE — Patient Instructions (Signed)
 [facility] [place] - A DEPT OF [facility]  Discharge Instructions: Thank you for choosing [facility] to provide your oncology and hematology care.  If you have a lab appointment with the Cancer Center - please note that after April 8th, 2024, all labs will be drawn in the cancer center.  You do not have to check in or register with the main entrance as you have in the past but will complete your check-in in the cancer center.  Wear comfortable clothing and clothing appropriate for easy access to any Portacath or PICC line.   We strive to give you quality time with your provider. You may need to reschedule your appointment if you arrive late (15 or more minutes).  Arriving late affects you and other patients whose appointments are after yours.  Also, if you miss three or more appointments without notifying the office, you may be dismissed from the clinic at the provider's discretion.      For prescription refill requests, have your pharmacy contact our office and allow 72 hours for refills to be completed.    Today you received the following chemotherapy and/or immunotherapy agents Infed      To help prevent nausea and vomiting after your treatment, we encourage you to take your nausea medication as directed.  BELOW ARE SYMPTOMS THAT SHOULD BE REPORTED IMMEDIATELY: *FEVER GREATER THAN 100.4 F (38 C) OR HIGHER *CHILLS OR SWEATING *NAUSEA AND VOMITING THAT IS NOT CONTROLLED WITH YOUR NAUSEA MEDICATION *UNUSUAL SHORTNESS OF BREATH *UNUSUAL BRUISING OR BLEEDING *URINARY PROBLEMS (pain or burning when urinating, or frequent urination) *BOWEL PROBLEMS (unusual diarrhea, constipation, pain near the anus) TENDERNESS IN MOUTH AND THROAT WITH OR WITHOUT PRESENCE OF ULCERS (sore throat, sores in mouth, or a toothache) UNUSUAL RASH, SWELLING OR PAIN  UNUSUAL VAGINAL DISCHARGE OR ITCHING   Items with * indicate a potential emergency and should be followed up as  soon as possible or go to the Emergency Department if any problems should occur.  Please show the CHEMOTHERAPY ALERT CARD or IMMUNOTHERAPY ALERT CARD at check-in to the Emergency Department and triage nurse.  Should you have questions after your visit or need to cancel or reschedule your appointment, please contact [facility] [place] - A DEPT OF [facility]igh[facility] [phone]  and follow the prompts.  Office hours are 8:00 a.m. to 4:30 p.m. Monday - Friday. Please note that voicemails left after 4:00 p.m. may not be returned until the following business day.  We are closed weekends and major holidays. You have access to a nurse at all times for urgent questions. Please call the main number to the clinic [phone] and follow the prompts.  For any non-urgent questions, you may also contact your provider using MyChart. We now offer e-Visits for anyone 6 and older to request care online for non-urgent symptoms. For details visit [email or web address].   Also download the MyChart app! Go to the app store, search "MyChart", open the app, select [place], and log in with your MyChart username and password.

## 2023-10-23 NOTE — Progress Notes (Signed)
 Patient presents today for Infed infusion per providers order.  Vital signs WNL.  Patient has no new complaints at this time.  Peripheral IV started and blood return noted pre and post infusion.  Stable during infusion without adverse affects.  Vital signs stable.  No complaints at this time.  Discharge from clinic ambulatory in stable condition.  Alert and oriented X 3.  Follow up with [facility] as scheduled.

## 2024-01-02 ENCOUNTER — Inpatient Hospital Stay: Payer: Medicare HMO | Attending: Hematology

## 2024-01-02 DIAGNOSIS — D500 Iron deficiency anemia secondary to blood loss (chronic): Secondary | ICD-10-CM | POA: Insufficient documentation

## 2024-01-02 DIAGNOSIS — Z801 Family history of malignant neoplasm of trachea, bronchus and lung: Secondary | ICD-10-CM | POA: Insufficient documentation

## 2024-01-02 DIAGNOSIS — Z803 Family history of malignant neoplasm of breast: Secondary | ICD-10-CM | POA: Insufficient documentation

## 2024-01-02 DIAGNOSIS — K31811 Angiodysplasia of stomach and duodenum with bleeding: Secondary | ICD-10-CM | POA: Insufficient documentation

## 2024-01-02 DIAGNOSIS — F17210 Nicotine dependence, cigarettes, uncomplicated: Secondary | ICD-10-CM | POA: Insufficient documentation

## 2024-01-02 DIAGNOSIS — Z800 Family history of malignant neoplasm of digestive organs: Secondary | ICD-10-CM | POA: Insufficient documentation

## 2024-01-06 ENCOUNTER — Inpatient Hospital Stay

## 2024-01-06 DIAGNOSIS — D508 Other iron deficiency anemias: Secondary | ICD-10-CM

## 2024-01-06 DIAGNOSIS — Z800 Family history of malignant neoplasm of digestive organs: Secondary | ICD-10-CM | POA: Diagnosis not present

## 2024-01-06 DIAGNOSIS — F17210 Nicotine dependence, cigarettes, uncomplicated: Secondary | ICD-10-CM | POA: Diagnosis not present

## 2024-01-06 DIAGNOSIS — K31811 Angiodysplasia of stomach and duodenum with bleeding: Secondary | ICD-10-CM | POA: Diagnosis present

## 2024-01-06 DIAGNOSIS — Z803 Family history of malignant neoplasm of breast: Secondary | ICD-10-CM | POA: Diagnosis not present

## 2024-01-06 DIAGNOSIS — Z801 Family history of malignant neoplasm of trachea, bronchus and lung: Secondary | ICD-10-CM | POA: Diagnosis not present

## 2024-01-06 DIAGNOSIS — D500 Iron deficiency anemia secondary to blood loss (chronic): Secondary | ICD-10-CM | POA: Diagnosis present

## 2024-01-06 LAB — CBC WITH DIFFERENTIAL/PLATELET
Abs Immature Granulocytes: 0.01 10*3/uL (ref 0.00–0.07)
Basophils Absolute: 0.1 10*3/uL (ref 0.0–0.1)
Basophils Relative: 1 %
Eosinophils Absolute: 0.4 10*3/uL (ref 0.0–0.5)
Eosinophils Relative: 5 %
HCT: 37 % — ABNORMAL LOW (ref 39.0–52.0)
Hemoglobin: 12 g/dL — ABNORMAL LOW (ref 13.0–17.0)
Immature Granulocytes: 0 %
Lymphocytes Relative: 19 %
Lymphs Abs: 1.3 10*3/uL (ref 0.7–4.0)
MCH: 30.7 pg (ref 26.0–34.0)
MCHC: 32.4 g/dL (ref 30.0–36.0)
MCV: 94.6 fL (ref 80.0–100.0)
Monocytes Absolute: 0.6 10*3/uL (ref 0.1–1.0)
Monocytes Relative: 9 %
Neutro Abs: 4.6 10*3/uL (ref 1.7–7.7)
Neutrophils Relative %: 66 %
Platelets: 171 10*3/uL (ref 150–400)
RBC: 3.91 MIL/uL — ABNORMAL LOW (ref 4.22–5.81)
RDW: 14.8 % (ref 11.5–15.5)
WBC: 7 10*3/uL (ref 4.0–10.5)
nRBC: 0 % (ref 0.0–0.2)

## 2024-01-06 LAB — IRON AND TIBC
Iron: 99 ug/dL (ref 45–182)
Saturation Ratios: 35 % (ref 17.9–39.5)
TIBC: 281 ug/dL (ref 250–450)
UIBC: 182 ug/dL

## 2024-01-06 LAB — FERRITIN: Ferritin: 95 ng/mL (ref 24–336)
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
## 2024-01-09 ENCOUNTER — Inpatient Hospital Stay: Payer: Medicare HMO | Admitting: Oncology

## 2024-01-09 DIAGNOSIS — D508 Other iron deficiency anemias: Secondary | ICD-10-CM | POA: Diagnosis not present

## 2024-01-09 DIAGNOSIS — D500 Iron deficiency anemia secondary to blood loss (chronic): Secondary | ICD-10-CM | POA: Diagnosis not present

## 2024-01-22 ENCOUNTER — Ambulatory Visit: Attending: Cardiology | Admitting: Cardiology

## 2024-01-22 NOTE — Progress Notes (Deleted)
 Clinical Summary Mr. Marquard is a 78 y.o.male  seen today for follow up of the following medical problems.      1. HTN - dizzy spells have resolved with spreading out bp meds throughout the day - compliant with meds       2. Carotid stenosis - RICA >70%, LICA <50%.  - has been on ASA and plavix , presume due to his history cerebrovascular disease  12/2021 RICA 80-99%, LICA 60-79%    04/2022 right ICA stenting with Dr Vikki Graves 05/2022 US   RICA mild, LICA 60-79% 07/2023 left TCAR       3. Hyperlipidemia - compliant with statin - reports most recent labs with pcp   11/2022 TC 117 TG 99 HDL 47 LDL 51 07/2023 TC 85 TG 85 HDL 37 LDL 31   4. OSA - compliant with CPAP - followed by Dr Villa Greaser   5. Prostate CA - history of prior seed implant, reports cancer free   6. CAD - incidental findings form prior chest CT in 2013. The patient has not had symptoms of angina - 05/2017 nuclear stress: possible mild inferoseptal ischemia vs artifact, low risk   - no cardiac chest pains   7. Moderate aortic aortic stenosis - 08/2021 echo moderate AS mean 27 mmHg, AVA VTI 1.05  07/2022 echo: LVEF 60-65%, moderate AS AVA VTI 1.15 mean grad 20  08/2023 echo: LVEF 60-65%, grade I dd, mod AS AVA VTI 1, mean grad 34   8. AAA screen - male over 72 former smoker - 02/2014 CT showed no aneurysm   9. GI bleed/ FE deficient anemia - admit Jan 2024 with symptomatic anemia, GI bleed - Hgb 7, received 1 unit pRBCs - EGD mottled gastric mucosa no clear ulcer or bleeding source, colon divetriculi no clear bleed - he was on ASA/plavix /celebrex  at the time. Found to have hpylori gastiritis, completed treatment   - admit 07/2023 with GI bleed, Hgb 6.5. Given 2 units pRBCs - Underwent EGD on 08/09/2023--Normal esophagus, - Normal stomach, Duodenal AVMs x 2; ablated    10. Chronic back pain - on celebrex , has not been able to wean.  Past Medical History:  Diagnosis Date   Anemia    Iron  Deficiency    Bilateral renal cysts    Bladder cancer (HCC) UROLOGIST-  DR [facility]   RECURRENT 11/ 2017 --  hx turbt in 2004 and 08-19-2013   BPH without obstruction/lower urinary tract symptoms    Cervical fusion syndrome    LEFT ARM NUMBNESS--  CONTROLLED W/ GABAPENTIN    Complication of anesthesia    Coronary artery disease    CARDIOLOGIST-- DR Amanda Jungling [facility])   DDD (degenerative disc disease), cervical    DDD (degenerative disc disease), lumbar    Diverticulosis of sigmoid colon    Heart murmur    slight murmur per patient   History of acute gouty arthritis    History of adenomatous polyp of colon    2001;  2012- non-malignant leiomyoma;  04-07-2014  tubualr adenoma and hyperplastic polyp's   History of prostate cancer UROLOGIST-  DR [facility]   S/P  RADIOACTIVE SEED IMPLANTS 2004   HOH (hard of hearing)    no hearing aids   Hyperlipidemia    Hypertension    OA (osteoarthritis)    Occlusion and stenosis of carotid artery without mention of cerebral infarction CARDIOLOGIST  -- DR  Arlyce Lambert Sama Arauz   LAST DUPLEX  11-23-2015  RICA  (DUPLEX DOPPLER 01-20-2016 RICA  60-79% PROXIMAL) /   LICA <50% (HAD CONSULT W/ DR Farrel Hones , VASCULAR SURGEON 01-20-2016)   OSA on CPAP    BiPAP   Peripheral vascular disease (HCC)    Carotid Artery Stenosis   PONV (postoperative nausea and vomiting)    Prostate cancer (HCC)    per patient "years ago"- had radiation implant   Spondylosis, cervical    Type 2 diabetes mellitus (HCC)    Wears glasses      Allergies  Allergen Reactions   Dilaudid  [Hydromorphone  Hcl] Other (See Comments)    "acts a little off"   Lyrica [Pregabalin] Nausea Only and Other (See Comments)    Make me feel "bad"   Toradol  [Ketorolac  Tromethamine ] Other (See Comments)    Caused hallucination - states not sure / avoids per his MD   Tramadol  Other (See Comments)    Couldn't sleep     Current Outpatient Medications  Medication Sig Dispense Refill   acetaminophen  (TYLENOL ) 500 MG  tablet Take 1,000 mg by mouth every 6 (six) hours as needed for moderate pain.     albuterol  (VENTOLIN  HFA) 108 (90 Base) MCG/ACT inhaler Inhale 2 puffs into the lungs every 4 (four) hours as needed for shortness of breath or wheezing.     allopurinol  (ZYLOPRIM ) 300 MG tablet Take 300 mg by mouth at bedtime.     amLODipine  (NORVASC ) 10 MG tablet TAKE 1 TABLET BY MOUTH EVERY DAY 90 tablet 3   aspirin  EC 81 MG tablet Take 1 tablet (81 mg total) by mouth daily with breakfast. 30 tablet 12   Budeson-Glycopyrrol-Formoterol (BREZTRI AEROSPHERE) 160-9-4.8 MCG/ACT AERO Inhale 1 puff into the lungs daily.     colchicine  0.6 MG tablet Take 0.6 mg by mouth 2 (two) times daily as needed (gout).      cyanocobalamin  (VITAMIN B12) 1000 MCG tablet Take 1 tablet (1,000 mcg total) by mouth daily. 30 tablet 1   docusate sodium  (COLACE) 100 MG capsule Take 100 mg by mouth at bedtime.     ferrous sulfate  325 (65 FE) MG EC tablet Take 325 mg by mouth daily.     finasteride  (PROSCAR ) 5 MG tablet Take 5 mg by mouth at bedtime.     furosemide  (LASIX ) 40 MG tablet Take 40 mg by mouth every morning.     gabapentin  (NEURONTIN ) 300 MG capsule Take 300 mg by mouth 2 (two) times daily.     Iron -FA-B Cmp-C-Biot-Probiotic (FUSION PLUS) CAPS Take 1 capsule by mouth daily.     labetalol  (NORMODYNE ) 100 MG tablet Take 100 mg by mouth at bedtime.     losartan  (COZAAR ) 100 MG tablet Take 1 tablet (100 mg total) by mouth every evening. (Patient taking differently: Take 100 mg by mouth in the morning.) 30 tablet 6   metFORMIN  (GLUCOPHAGE -XR) 500 MG 24 hr tablet Take 1 tablet (500 mg total) by mouth in the morning and at bedtime.     NON FORMULARY Pt uses cpap     Omega-3-6-9 CAPS Take 1 capsule by mouth 2 (two) times daily.     oxyCODONE -acetaminophen  (PERCOCET/ROXICET) 5-325 MG tablet TAKE 1 TABLET BY MOUTH EVERY 6 TO 8 HOURS AS NEEDED     pantoprazole  (PROTONIX ) 40 MG tablet Take 1 tablet (40 mg total) by mouth 2 (two) times daily.  (Patient taking differently: Take 40 mg by mouth daily.) 180 tablet 3   polyethylene glycol powder (GLYCOLAX /MIRALAX ) powder TAKE 17MG (1CAPFUL) BY MOUTH(MIXED WITH WATER Ali Antonio) DAILY 527 g 0   rosuvastatin  (CRESTOR ) 10  MG tablet Take 10 mg by mouth every morning.      tamsulosin  (FLOMAX ) 0.4 MG CAPS capsule Take 0.4 mg by mouth daily.     Vitamin D, Ergocalciferol, (DRISDOL) 1.25 MG (50000 UNIT) CAPS capsule Take 50,000 Units by mouth every Thursday.     No current facility-administered medications for this visit.     Past Surgical History:  Procedure Laterality Date   ANTERIOR REMOVAL CAGE AND PLATE Q5-Z5/ CORPECTOMY C7 (FX)/  ALLOGRAFT AND FUSION C3 -- T1/  POSTERIOR DECOMPRESSION BILATERAL LAMINECTOMY C4 -- 6 & PARTIAL C3/  POSTEROLATERAL ARTHRODESIS C3 - T1  06/05/2005   APPENDECTOMY  1990'S   BIOPSY  09/21/2022   Procedure: BIOPSY;  Surgeon: Suzette Espy, MD;  [facility]: [facility];  Service: Endoscopy;;   CATARACT EXTRACTION W/ INTRAOCULAR LENS  IMPLANT, BILATERAL  2013   CERVICAL FUSION  05/11/2005   C3  --  C7/  due to post op quadriparesis same day s/p  anterior C 4,5,6, colpectomy, decompression, removal epidural hematoma, foraminnotomy, cage and plate   COLONOSCOPY  last one 04-07-2014   COLONOSCOPY WITH PROPOFOL  N/A 09/21/2022   Procedure: COLONOSCOPY WITH PROPOFOL ;  Surgeon: Suzette Espy, MD;  [facility]: [facility];  Service: Endoscopy;  Laterality: N/A;   CYSTOSCOPY W/ RETROGRADES Bilateral 08/19/2013   Procedure: CYSTOSCOPY WITH RETROGRADE PYELOGRAM;  Surgeon: Soledad Dupes, MD;  Location[facility];  Service: Urology;  Laterality: Bilateral;   CYSTOSCOPY W/ RETROGRADES Bilateral 07/18/2016   Procedure: CYSTOSCOPY WITH RETROGRADE PYELOGRAM;  Surgeon: Osborn Blaze, MD;  Location: [facility];  Service: Urology;  Laterality: Bilateral;   ENDOSCOPIC REPAIR CSF LEAK VIA NASAL PASSAGE  2011   ESOPHAGOGASTRODUODENOSCOPY (EGD) WITH  PROPOFOL  N/A 09/21/2022   Procedure: ESOPHAGOGASTRODUODENOSCOPY (EGD) WITH PROPOFOL ;  Surgeon: Suzette Espy, MD;  [facility];  Service: Endoscopy;  Laterality: N/A;   ESOPHAGOGASTRODUODENOSCOPY (EGD) WITH PROPOFOL  N/A 08/09/2023   Procedure: ESOPHAGOGASTRODUODENOSCOPY (EGD) WITH PROPOFOL ;  Surgeon: Suzette Espy, MD;  [facility];  Service: Endoscopy;  Laterality: N/A;   HEMOSTASIS CLIP PLACEMENT  09/21/2022   Procedure: HEMOSTASIS CLIP PLACEMENT;  Surgeon: Suzette Espy, MD;  [facility];  Service: Endoscopy;;   HOT HEMOSTASIS  08/09/2023   Procedure: HOT HEMOSTASIS (ARGON PLASMA COAGULATION/BICAP);  Surgeon: Suzette Espy, MD;  [facility];  Service: Endoscopy;;   INTRAOPERATIVE ARTERIOGRAM  CATH LAB  01-05-2009  DR Berry Bristol   RICA   ACUTE ANGLE 80-85% STENOSIS   KNEE ARTHROSCOPY Left 07/15/2017   Procedure: ARTHROSCOPY LEFT  KNEE AND DEBRIDEMENT, medial meniscal tear and chondromalaita;  Surgeon: Claiborne Crew, MD;  Location: [facility];  Service: Orthopedics;  Laterality: Left;  60 MINS   LUMBAR FUSION  03-26-2012   L3 --  L5   POLYPECTOMY  09/21/2022   Procedure: POLYPECTOMY;  Surgeon: Suzette Espy, MD;  [facility]: [facility];  Service: Endoscopy;;   RADIOACTIVE PROSTATE SEED IMPLANTS  08-19-2003   SEPTOPLASTY  1998   TOTAL KNEE ARTHROPLASTY Left 02/25/2018   Procedure: LEFT TOTAL KNEE ARTHROPLASTY;  Surgeon: Claiborne Crew, MD;  Location: [facility];  Service: Orthopedics;  Laterality: Left;  70 mins   TRANSCAROTID ARTERY REVASCULARIZATION  Right 05/11/2022   Procedure: Right Transcarotid Artery Revascularization;  Surgeon: Adine Hoof, MD;  Location: [facility];  Service: Vascular;  Laterality: Right;   TRANSCAROTID ARTERY REVASCULARIZATION  Left 07/19/2023   Procedure: LEFT TRANSCAROTID ARTERY REVASCULARIZATION;  Surgeon: Vikki Graves,  Antony Baumgartner, MD;  Location: [facility] OR;  Service: Vascular;  Laterality: Left;   TRANSURETHRAL  RESECTION OF BLADDER TUMOR N/A 08/19/2013   Procedure: TRANSURETHRAL RESECTION OF BLADDER TUMOR (TURBT);  Surgeon: Soledad Dupes, MD;  Location: [facility];  Service: Urology;  Laterality: N/A;   TRANSURETHRAL RESECTION OF BLADDER TUMOR N/A 07/18/2016   Procedure: TRANSURETHRAL RESECTION OF BLADDER TUMOR (TURBT);  Surgeon: Osborn Blaze, MD;  Location: [facility];  Service: Urology;  Laterality: N/A;   ULTRASOUND GUIDANCE FOR VASCULAR ACCESS Right 07/19/2023   Procedure: ULTRASOUND GUIDANCE FOR VASCULAR ACCESS, RIGHT FEMORAL VEIN;  Surgeon: Adine Hoof, MD;  Location: [facility] OR;  Service: Vascular;  Laterality: Right;   YAG LASER APPLICATION  07/29/2012   Procedure: YAG LASER APPLICATION;  Surgeon: Lavonia Powers T. Gennie Kicks, MD;  Location: [facility];  Service: Ophthalmology;  Laterality: Right;     Allergies  Allergen Reactions   Dilaudid  [Hydromorphone  Hcl] Other (See Comments)    "acts a little off"   Lyrica [Pregabalin] Nausea Only and Other (See Comments)    Make me feel "bad"   Toradol  [Ketorolac  Tromethamine ] Other (See Comments)    Caused hallucination - states not sure / avoids per his MD   Tramadol  Other (See Comments)    Couldn't sleep      Family History  Problem Relation Age of Onset   Breast cancer Mother        mets   Diabetes Mother    Heart disease Father        MI   Colon cancer Brother    Cancer Sister        male organs   Diabetes Sister        family hx   Arthritis Other        entire family   Diabetes Brother        x 2     Social History Mr. Gadaleta reports that he has been smoking cigarettes. He has a 47 pack-year smoking history. He has never used smokeless tobacco. Mr. Zuckerman reports no history of alcohol use.   Review of Systems CONSTITUTIONAL: No weight loss, fever, chills, weakness or fatigue.  HEENT: Eyes: No visual loss, blurred vision, double vision or yellow sclerae.No hearing loss, sneezing,  congestion, runny nose or sore throat.  SKIN: No rash or itching.  CARDIOVASCULAR:  RESPIRATORY: No shortness of breath, cough or sputum.  GASTROINTESTINAL: No anorexia, nausea, vomiting or diarrhea. No abdominal pain or blood.  GENITOURINARY: No burning on urination, no polyuria NEUROLOGICAL: No headache, dizziness, syncope, paralysis, ataxia, numbness or tingling in the extremities. No change in bowel or bladder control.  MUSCULOSKELETAL: No muscle, back pain, joint pain or stiffness.  LYMPHATICS: No enlarged nodes. No history of splenectomy.  PSYCHIATRIC: No history of depression or anxiety.  ENDOCRINOLOGIC: No reports of sweating, cold or heat intolerance. No polyuria or polydipsia.  Aaron Aas   Physical Examination There were no vitals filed for this visit. There were no vitals filed for this visit.  Gen: resting comfortably, no acute distress HEENT: no scleral icterus, pupils equal round and reactive, no palptable cervical adenopathy,  CV Resp: Clear to auscultation bilaterally GI: abdomen is soft, non-tender, non-distended, normal bowel sounds, no hepatosplenomegaly MSK: extremities are warm, no edema.  Skin: warm, no rash Neuro:  no focal deficits Psych: appropriate affect   Diagnostic Studies  05/2017 nuclear stress No diagnostic ST segment changes indicating ischemia. No arrhythmias. Moderate-sized, mild intensity, inferior and inferoseptal defects  that are largely fixed with a partial region of reversibility in the mid inferoseptal zone. Normal wall motion in this area argues against scar. Although diaphragmatic attenuation is likely playing a role, consider a small region of inferoseptal ischemia. This is a low risk study. Nuclear stress EF: 64%.     07/2022 echo 1. Left ventricular ejection fraction, by estimation, is 60 to 65%. The  left ventricle has normal function. The left ventricle has no regional  wall motion abnormalities. There is mild left ventricular  hypertrophy.  Left ventricular diastolic parameters  are indeterminate.   2. Right ventricular systolic function is normal. The right ventricular  size is normal. There is normal pulmonary artery systolic pressure.   3. Aortic valve leaflets are densely calcified with moderate to severe  restriction of the leaflet mobility. There is moderate aortic valve  stenosis present with calculated aortic valve area of 1.15 cm2, Vmax 2.9  m/sec and mean pressure gradient of 20  mmHg.   4. The inferior vena cava is normal in size with greater than 50%  respiratory variability, suggesting right atrial pressure of 3 mmHg.    08/2023 echo 1. Left ventricular ejection fraction, by estimation, is 60 to 65%. The  left ventricle has normal function. The left ventricle has no regional  wall motion abnormalities. There is moderate left ventricular hypertrophy.  Left ventricular diastolic  parameters are consistent with Grade I diastolic dysfunction (impaired  relaxation). Elevated left ventricular end-diastolic pressure.   2. Right ventricular systolic function is normal. The right ventricular  size is normal. There is normal pulmonary artery systolic pressure.   3. Right atrial size was mildly dilated.   4. The mitral valve is abnormal. Trivial mitral valve regurgitation. No  evidence of mitral stenosis.   5. The aortic valve is calcified. There is severe calcifcation of the  aortic valve. Aortic valve regurgitation is not visualized. Moderate  aortic valve stenosis. Aortic valve area, by VTI measures 1.00 cm. Aortic  valve mean gradient measures 34.0 mmHg.   Aortic valve Vmax measures 3.65 m/s.   6. The inferior vena cava is normal in size with greater than 50%  respiratory variability, suggesting right atrial pressure of 3 mmHg.   Assessment and Plan  1 . HTN -at goal, continue current meds     2. Carotid stenosis -history of bilateral stenting to carotids, followed by vascular - DAPT per  vascular, not on for cardiac reason.    3. Hyperlipidemia -at goal, continue current meds   4. CAD - incidental finding on CT scan, he has not had any cardiac symptoms - prior low risk stress test - no cardiac symptoms, continue current meds   5. Aortic stenosis - moderate by US ,we will repeat echo      Laurann Pollock, M.D., F.A.C.C.

## 2024-02-11 NOTE — Progress Notes (Unsigned)
 GI Office Note    Referring Provider: Omie Bickers, MD Primary Care Physician:  Omie Bickers, MD  Primary Gastroenterologist: Rolando Cliche. Mordechai April, DO   Chief Complaint   No chief complaint on file.   History of Present Illness   [name] is a 78 y.o. male presenting today    History of profound anemia (hemoglobin of 7) requiring hospitalization in January 2024, stool Hemoccult positive at that time. Found to have ferritin of 11, B12 156. Required blood transfusion. At discharge his hemoglobin was 7.8. Chronically on Celebrex , cannot function without it. Also on daily aspirin  81 mg, Plavix  75 mg daily. Completed EGD/TCS as outlined below. Confirmed h.pylori eradication 12/2022 via stool antigen.    When presented 08/09/23, his Hemoglobin 6.5 down from 9.6 on November 2. MCV 105.1.  Received 1 unit of packed red blood cells.  Hemoglobin up to 7.7 on November 23. EGD completed, two small bowel AVMS treated.   Previously on ASA, PLAVIX , and Celebrex , now currently only on ASA 81 mg daily.    Blood work 10/11/23: Hgb 12.7, iron  64, ferritin 39. Following with hematology.   Seen last in 09/2023.    Prior endoscopic workup: Colonoscopy 09/21/2022: - Diverticulosis in the sigmoid colon, in the descending colon and at the splenic flexure. - One 10 mm polyp in the cecum, removed with a cold snare. Resected and retrieved.  - The examination was otherwise normal on direct and retroflexion views. Cannot rule out a diverticular bleed here. -Pathology revealed tubular adenomas -Recommend repeat in 3 years   EGD 09/21/2022: - Normal esophagus.  - Abnormal gastric mucosa of uncertain significance (Reticulated, mottled appearance of the gastric mucosa diffusely), status post biopsy.  - Normal duodenal bulb and second portion of the duodenum. -Biopsies revealed H. pylori gastritis -Patient treated with Pylera   Colonoscopy July 2015: -2 sessile polyps ranging 3 to 5 mm in the cecum and  rectum (rectal polyp removed but not cecal polyp given on Plavix ) -Moderate to severe diverticulosis in the sigmoid colon -Complete resolution of inflammatory changes seen on CT scan -Cecal biopsy revealed to be a tubular adenoma, rectal polyp revealed to be hyperplastic   EGD November 2024 -Duodenal AVMs x 2 ablated  Medications   Current Outpatient Medications  Medication Sig Dispense Refill   acetaminophen  (TYLENOL ) 500 MG tablet Take 1,000 mg by mouth every 6 (six) hours as needed for moderate pain.     albuterol  (VENTOLIN  HFA) 108 (90 Base) MCG/ACT inhaler Inhale 2 puffs into the lungs every 4 (four) hours as needed for shortness of breath or wheezing.     allopurinol  (ZYLOPRIM ) 300 MG tablet Take 300 mg by mouth at bedtime.     amLODipine  (NORVASC ) 10 MG tablet TAKE 1 TABLET BY MOUTH EVERY DAY 90 tablet 3   aspirin  EC 81 MG tablet Take 1 tablet (81 mg total) by mouth daily with breakfast. 30 tablet 12   Budeson-Glycopyrrol-Formoterol (BREZTRI AEROSPHERE) 160-9-4.8 MCG/ACT AERO Inhale 1 puff into the lungs daily.     colchicine  0.6 MG tablet Take 0.6 mg by mouth 2 (two) times daily as needed (gout).      cyanocobalamin  (VITAMIN B12) 1000 MCG tablet Take 1 tablet (1,000 mcg total) by mouth daily. 30 tablet 1   docusate sodium  (COLACE) 100 MG capsule Take 100 mg by mouth at bedtime.     ferrous sulfate  325 (65 FE) MG EC tablet Take 325 mg by mouth daily.  finasteride  (PROSCAR ) 5 MG tablet Take 5 mg by mouth at bedtime.     furosemide  (LASIX ) 40 MG tablet Take 40 mg by mouth every morning.     gabapentin  (NEURONTIN ) 300 MG capsule Take 300 mg by mouth 2 (two) times daily.     Iron -FA-B Cmp-C-Biot-Probiotic (FUSION PLUS) CAPS Take 1 capsule by mouth daily.     labetalol  (NORMODYNE ) 100 MG tablet Take 100 mg by mouth at bedtime.     losartan  (COZAAR ) 100 MG tablet Take 1 tablet (100 mg total) by mouth every evening. (Patient taking differently: Take 100 mg by mouth in the morning.) 30  tablet 6   metFORMIN  (GLUCOPHAGE -XR) 500 MG 24 hr tablet Take 1 tablet (500 mg total) by mouth in the morning and at bedtime.     NON FORMULARY Pt uses cpap     Omega-3-6-9 CAPS Take 1 capsule by mouth 2 (two) times daily.     oxyCODONE -acetaminophen  (PERCOCET/ROXICET) 5-325 MG tablet TAKE 1 TABLET BY MOUTH EVERY 6 TO 8 HOURS AS NEEDED     pantoprazole  (PROTONIX ) 40 MG tablet Take 1 tablet (40 mg total) by mouth 2 (two) times daily. (Patient taking differently: Take 40 mg by mouth daily.) 180 tablet 3   polyethylene glycol powder (GLYCOLAX /MIRALAX ) powder TAKE 17MG (1CAPFUL) BY MOUTH(MIXED WITH WATER Ali Antonio) DAILY 527 g 0   rosuvastatin  (CRESTOR ) 10 MG tablet Take 10 mg by mouth every morning.      tamsulosin  (FLOMAX ) 0.4 MG CAPS capsule Take 0.4 mg by mouth daily.     Vitamin D, Ergocalciferol, (DRISDOL) 1.25 MG (50000 UNIT) CAPS capsule Take 50,000 Units by mouth every Thursday.     No current facility-administered medications for this visit.    Allergies   Allergies as of 02/12/2024 - Review Complete 01/09/2024  Allergen Reaction Noted   Dilaudid  [hydromorphone  hcl] Other (See Comments) 04/06/2014   Lyrica [pregabalin] Nausea Only and Other (See Comments)    Toradol  [ketorolac  tromethamine ] Other (See Comments) 02/04/2014   Tramadol  Other (See Comments) 05/02/2022     Past Medical History   Past Medical History:  Diagnosis Date   Anemia    Iron  Deficiency   Bilateral renal cysts    Bladder cancer (HCC) UROLOGIST-  DR [facility]   RECURRENT 11/ 2017 --  hx turbt in 2004 and 08-19-2013   BPH without obstruction/lower urinary tract symptoms    Cervical fusion syndrome    LEFT ARM NUMBNESS--  CONTROLLED W/ GABAPENTIN    Complication of anesthesia    Coronary artery disease    CARDIOLOGIST-- DR Amanda Jungling [facility])   DDD (degenerative disc disease), cervical    DDD (degenerative disc disease), lumbar    Diverticulosis of sigmoid colon    Heart murmur    slight murmur per  patient   History of acute gouty arthritis    History of adenomatous polyp of colon    2001;  2012- non-malignant leiomyoma;  04-07-2014  tubualr adenoma and hyperplastic polyp's   History of prostate cancer UROLOGIST-  DR [facility]   S/P  RADIOACTIVE SEED IMPLANTS 2004   HOH (hard of hearing)    no hearing aids   Hyperlipidemia    Hypertension    OA (osteoarthritis)    Occlusion and stenosis of carotid artery without mention of cerebral infarction CARDIOLOGIST  -- DR  Arlyce Lambert [facility]ANCH   LAST DUPLEX  11-23-2015  RICA  (DUPLEX DOPPLER 01-20-2016 RICA 60-79% PROXIMAL) /   LICA <50% (HAD CONSULT W/ DR Farrel Hones , VASCULAR SURGEON 01-20-2016)  OSA on CPAP    BiPAP   Peripheral vascular disease (HCC)    Carotid Artery Stenosis   PONV (postoperative nausea and vomiting)    Prostate cancer (HCC)    per patient "years ago"- had radiation implant   Spondylosis, cervical    Type 2 diabetes mellitus (HCC)    Wears glasses     Past Surgical History   Past Surgical History:  Procedure Laterality Date   ANTERIOR REMOVAL CAGE AND PLATE J1-B1/ CORPECTOMY C7 (FX)/  ALLOGRAFT AND FUSION C3 -- T1/  POSTERIOR DECOMPRESSION BILATERAL LAMINECTOMY C4 -- 6 & PARTIAL C3/  POSTEROLATERAL ARTHRODESIS C3 - T1  06/05/2005   APPENDECTOMY  1990'S   BIOPSY  09/21/2022   Procedure: BIOPSY;  Surgeon: Suzette Espy, MD;  [facility];  Service: Endoscopy;;   CATARACT EXTRACTION W/ INTRAOCULAR LENS  IMPLANT, BILATERAL  2013   CERVICAL FUSION  05/11/2005   C3  --  C7/  due to post op quadriparesis same day s/p  anterior C 4,5,6, colpectomy, decompression, removal epidural hematoma, foraminnotomy, cage and plate   COLONOSCOPY  last one 04-07-2014   COLONOSCOPY WITH PROPOFOL  N/A 09/21/2022   Procedure: COLONOSCOPY WITH PROPOFOL ;  Surgeon: Suzette Espy, MD;  [facility];  Service: Endoscopy;  Laterality: N/A;   CYSTOSCOPY W/ RETROGRADES Bilateral 08/19/2013   Procedure: CYSTOSCOPY WITH RETROGRADE  PYELOGRAM;  Surgeon: Soledad Dupes, MD;  Location: [facility];  Service: Urology;  Laterality: Bilateral;   CYSTOSCOPY W/ RETROGRADES Bilateral 07/18/2016   Procedure: CYSTOSCOPY WITH RETROGRADE PYELOGRAM;  Surgeon: Osborn Blaze, MD;  Location: [facility];  Service: Urology;  Laterality: Bilateral;   ENDOSCOPIC REPAIR CSF LEAK VIA NASAL PASSAGE  2011   ESOPHAGOGASTRODUODENOSCOPY (EGD) WITH PROPOFOL  N/A 09/21/2022   Procedure: ESOPHAGOGASTRODUODENOSCOPY (EGD) WITH PROPOFOL ;  Surgeon: Suzette Espy, MD;  [facility];  Service: Endoscopy;  Laterality: N/A;   ESOPHAGOGASTRODUODENOSCOPY (EGD) WITH PROPOFOL  N/A 08/09/2023   Procedure: ESOPHAGOGASTRODUODENOSCOPY (EGD) WITH PROPOFOL ;  Surgeon: Suzette Espy, MD;  [facility];  Service: Endoscopy;  Laterality: N/A;   HEMOSTASIS CLIP PLACEMENT  09/21/2022   Procedure: HEMOSTASIS CLIP PLACEMENT;  Surgeon: Suzette Espy, MD;  [facility];  Service: Endoscopy;;   HOT HEMOSTASIS  08/09/2023   Procedure: HOT HEMOSTASIS (ARGON PLASMA COAGULATION/BICAP);  Surgeon: Suzette Espy, MD;  [facility];  Service: Endoscopy;;   INTRAOPERATIVE ARTERIOGRAM  CATH LAB  01-05-2009  DR Berry Bristol   RICA   ACUTE ANGLE 80-85% STENOSIS   KNEE ARTHROSCOPY Left 07/15/2017   Procedure: ARTHROSCOPY LEFT  KNEE AND DEBRIDEMENT, medial meniscal tear and chondromalaita;  Surgeon: Claiborne Crew, MD;  Location: [facility];  Service: Orthopedics;  Laterality: Left;  60 MINS   LUMBAR FUSION  03-26-2012   L3 --  L5   POLYPECTOMY  09/21/2022   Procedure: POLYPECTOMY;  Surgeon: Suzette Espy, MD;  [facility];  Service: Endoscopy;;   RADIOACTIVE PROSTATE SEED IMPLANTS  08-19-2003   SEPTOPLASTY  1998   TOTAL KNEE ARTHROPLASTY Left 02/25/2018   Procedure: LEFT TOTAL KNEE ARTHROPLASTY;  Surgeon: Claiborne Crew, MD;  Location: [facility];  Service: Orthopedics;  Laterality: Left;  70 mins   TRANSCAROTID  ARTERY REVASCULARIZATION  Right 05/11/2022   Procedure: Right Transcarotid Artery Revascularization;  Surgeon: Adine Hoof, MD;  Location: [facility];  Service: Vascular;  Laterality: Right;   TRANSCAROTID ARTERY REVASCULARIZATION  Left 07/19/2023   Procedure:  LEFT TRANSCAROTID ARTERY REVASCULARIZATION;  Surgeon: Adine Hoof, MD;  Location[facility];  Service: Vascular;  Laterality: Left;   TRANSURETHRAL RESECTION OF BLADDER TUMOR N/A 08/19/2013   Procedure: TRANSURETHRAL RESECTION OF BLADDER TUMOR (TURBT);  Surgeon: Soledad Dupes, MD;  Location: [facility];  Service: Urology;  Laterality: N/A;   TRANSURETHRAL RESECTION OF BLADDER TUMOR N/A 07/18/2016   Procedure: TRANSURETHRAL RESECTION OF BLADDER TUMOR (TURBT);  Surgeon: Osborn Blaze, MD;  Location: [facility];  Service: Urology;  Laterality: N/A;   ULTRASOUND GUIDANCE FOR VASCULAR ACCESS Right 07/19/2023   Procedure: ULTRASOUND GUIDANCE FOR VASCULAR ACCESS, RIGHT FEMORAL VEIN;  Surgeon: Adine Hoof, MD;  Location[facility] OR;  Service: Vascular;  Laterality: Right;   YAG LASER APPLICATION  07/29/2012   Procedure: YAG LASER APPLICATION;  Surgeon: Lavonia Powers T. Gennie Kicks, MD;  Location: [facility];  Service: Ophthalmology;  Laterality: Right;    Past Family History   Family History  Problem Relation Age of Onset   Breast cancer Mother        mets   Diabetes Mother    Heart disease Father        MI   Colon cancer Brother    Cancer Sister        male organs   Diabetes Sister        family hx   Arthritis Other        entire family   Diabetes Brother        x 2    Past Social History   Social History   Socioeconomic History   Marital status: Widowed    Spouse name: Not on file   Number of children: 2   Years of education: Not on file   Highest education level: Not on file  Occupational History   Occupation: retired//disabled  Tobacco Use   Smoking status: Every Day     Current packs/day: 1.00    Average packs/day: 1 pack/day for 47.0 years (47.0 ttl pk-yrs)    Types: Cigarettes   Smokeless tobacco: Never   Tobacco comments:    pt quits smoking periodically :: started smoking again Jan 2018 1ppd  Vaping Use   Vaping status: Never Used  Substance and Sexual Activity   Alcohol use: No    Alcohol/week: 0.0 standard drinks of alcohol   Drug use: No   Sexual activity: Not Currently  Other Topics Concern   Not on file  Social History Narrative   Not on file   Social Drivers of Health   Financial Resource Strain: Not on file  Food Insecurity: No Food Insecurity (08/09/2023)   Hunger Vital Sign    Worried About Running Out of Food in the Last Year: Never true    Ran Out of Food in the Last Year: Never true  Transportation Needs: No Transportation Needs (08/09/2023)   PRAPARE - Administrator, Civil Service (Medical): No    Lack of Transportation (Non-Medical): No  Physical Activity: Not on file  Stress: Not on file  Social Connections: Not on file  Intimate Partner Violence: Not At Risk (08/09/2023)   Humiliation, Afraid, Rape, and Kick questionnaire    Fear of Current or Ex-Partner: No    Emotionally Abused: No    Physically Abused: No    Sexually Abused: No    Review of Systems   General: Negative for anorexia, weight loss, fever, chills, fatigue, weakness. ENT: Negative for hoarseness, difficulty swallowing , nasal congestion.  CV: Negative for chest pain, angina, palpitations, dyspnea on exertion, peripheral edema.  Respiratory: Negative for dyspnea at rest, dyspnea on exertion, cough, sputum, wheezing.  GI: See history of present illness. GU:  Negative for dysuria, hematuria, urinary incontinence, urinary frequency, nocturnal urination.  Endo: Negative for unusual weight change.     Physical Exam   There were no vitals taken for this visit.   General: Well-nourished, well-developed in no acute distress.  Eyes: No  icterus. Mouth: Oropharyngeal mucosa moist and pink , no lesions erythema or exudate. Lungs: Clear to auscultation bilaterally.  Heart: Regular rate and rhythm, no murmurs rubs or gallops.  Abdomen: Bowel sounds are normal, nontender, nondistended, no hepatosplenomegaly or masses,  no abdominal bruits or hernia , no rebound or guarding.  Rectal: ***  Extremities: No lower extremity edema. No clubbing or deformities. Neuro: Alert and oriented x 4   Skin: Warm and dry, no jaundice.   Psych: Alert and cooperative, normal mood and affect.  Labs   Lab Results  Component Value Date   WBC 7.0 01/06/2024   HGB 12.0 (L) 01/06/2024   HCT 37.0 (L) 01/06/2024   MCV 94.6 01/06/2024   PLT 171 01/06/2024   Lab Results  Component Value Date   IRON  99 01/06/2024   TIBC 281 01/06/2024   FERRITIN 95 01/06/2024   Lab Results  Component Value Date   VITAMINB12 1,787 (H) 08/20/2023   Lab Results  Component Value Date   FOLATE >40.0 08/20/2023    Imaging Studies   No results found.  Assessment       PLAN   ***pernicious anemia?? Anemia -IDA/B12 deficiency -treated for H.pylori gastritis 09/2022 with confirmed eradication -duodenal AVMs ablated last month -colonoscopy up to date -no overt GI bleeding lately -update labs, he may require transfusion if Hgb <7 due to symptomatic anemia, cannot rule out need for small bowel capsule study -recommend continues PPI, avoid NSAIDs -update medication list with daughter in law, Fernando Hoyer, RN.     Trudie Fuse. Harles Lied, MHS, PA-C [facility]

## 2024-02-12 ENCOUNTER — Encounter: Payer: Self-pay | Admitting: Gastroenterology

## 2024-02-12 ENCOUNTER — Ambulatory Visit: Payer: Medicare HMO | Admitting: Gastroenterology

## 2024-02-12 VITALS — BP 115/47 | HR 69 | Temp 98.4°F | Ht 67.0 in | Wt 182.4 lb

## 2024-02-12 DIAGNOSIS — Z8719 Personal history of other diseases of the digestive system: Secondary | ICD-10-CM | POA: Diagnosis not present

## 2024-02-12 DIAGNOSIS — K5900 Constipation, unspecified: Secondary | ICD-10-CM | POA: Diagnosis not present

## 2024-02-12 DIAGNOSIS — K5909 Other constipation: Secondary | ICD-10-CM

## 2024-02-12 DIAGNOSIS — Z8619 Personal history of other infectious and parasitic diseases: Secondary | ICD-10-CM | POA: Diagnosis not present

## 2024-02-12 DIAGNOSIS — D500 Iron deficiency anemia secondary to blood loss (chronic): Secondary | ICD-10-CM | POA: Diagnosis not present

## 2024-02-12 NOTE — Patient Instructions (Signed)
 Continue to follow with hematology for your anemia. Continue pantoprazole  40mg  once daily. Continue miralax  one capful daily for constipation. Monitor for worsening fatigue, shortness of breath, lightheadedness, black or bloody stools. Return office visit in six months.

## 2024-03-03 ENCOUNTER — Ambulatory Visit: Payer: Medicare HMO | Admitting: Cardiology

## 2024-04-02 ENCOUNTER — Inpatient Hospital Stay: Attending: Hematology

## 2024-04-02 DIAGNOSIS — D508 Other iron deficiency anemias: Secondary | ICD-10-CM

## 2024-04-02 DIAGNOSIS — K31811 Angiodysplasia of stomach and duodenum with bleeding: Secondary | ICD-10-CM | POA: Insufficient documentation

## 2024-04-02 DIAGNOSIS — K5909 Other constipation: Secondary | ICD-10-CM | POA: Diagnosis not present

## 2024-04-02 DIAGNOSIS — R053 Chronic cough: Secondary | ICD-10-CM | POA: Diagnosis not present

## 2024-04-02 DIAGNOSIS — D500 Iron deficiency anemia secondary to blood loss (chronic): Secondary | ICD-10-CM | POA: Insufficient documentation

## 2024-04-02 DIAGNOSIS — G4733 Obstructive sleep apnea (adult) (pediatric): Secondary | ICD-10-CM | POA: Insufficient documentation

## 2024-04-02 LAB — CBC WITH DIFFERENTIAL/PLATELET
Abs Immature Granulocytes: 0.03 K/uL (ref 0.00–0.07)
Basophils Absolute: 0.1 K/uL (ref 0.0–0.1)
Basophils Relative: 1 %
Eosinophils Absolute: 0.4 K/uL (ref 0.0–0.5)
Eosinophils Relative: 6 %
HCT: 33.6 % — ABNORMAL LOW (ref 39.0–52.0)
Hemoglobin: 11 g/dL — ABNORMAL LOW (ref 13.0–17.0)
Immature Granulocytes: 0 %
Lymphocytes Relative: 19 %
Lymphs Abs: 1.3 K/uL (ref 0.7–4.0)
MCH: 32 pg (ref 26.0–34.0)
MCHC: 32.7 g/dL (ref 30.0–36.0)
MCV: 97.7 fL (ref 80.0–100.0)
Monocytes Absolute: 0.8 K/uL (ref 0.1–1.0)
Monocytes Relative: 11 %
Neutro Abs: 4.3 K/uL (ref 1.7–7.7)
Neutrophils Relative %: 63 %
Platelets: 166 K/uL (ref 150–400)
RBC: 3.44 MIL/uL — ABNORMAL LOW (ref 4.22–5.81)
RDW: 14 % (ref 11.5–15.5)
WBC: 6.8 K/uL (ref 4.0–10.5)
nRBC: 0 % (ref 0.0–0.2)

## 2024-04-02 LAB — IRON AND TIBC
Iron: 49 ug/dL (ref 45–182)
Saturation Ratios: 17 % — ABNORMAL LOW (ref 17.9–39.5)
TIBC: 295 ug/dL (ref 250–450)
UIBC: 246 ug/dL

## 2024-04-02 LAB — FERRITIN: Ferritin: 30 ng/mL (ref 24–336)

## 2024-04-09 ENCOUNTER — Inpatient Hospital Stay: Admitting: Oncology

## 2024-04-09 VITALS — BP 154/71 | HR 76 | Temp 98.3°F | Resp 19 | Wt 185.6 lb

## 2024-04-09 DIAGNOSIS — K31811 Angiodysplasia of stomach and duodenum with bleeding: Secondary | ICD-10-CM | POA: Diagnosis not present

## 2024-04-09 DIAGNOSIS — D508 Other iron deficiency anemias: Secondary | ICD-10-CM | POA: Diagnosis not present

## 2024-04-09 DIAGNOSIS — D500 Iron deficiency anemia secondary to blood loss (chronic): Secondary | ICD-10-CM | POA: Diagnosis not present

## 2024-04-09 NOTE — Progress Notes (Signed)
 [facility] [address] [place] [address]   Clinic Day:  04/09/2024  Referring physician: [name], MD  Patient Care Team: Ricky Norleen PEDLAR, MD as PCP - General (Internal Medicine) Ricky Dorn FALCON, MD as PCP - Cardiology (Cardiology)   ASSESSMENT & PLAN:   Assessment:  1.  Severe iron  deficiency anemia: - Recent admission from 08/09/2023 through 08/10/2023 with hemoglobin 6.5, status post 1 unit PRBC.  EGD with 2 duodenal AVMs cauterized. - Previous admission to the hospital in January 2024, s/p 1 unit PRBC. - Has been on fusion plus iron  tablet from the beginning of the year. - Colonoscopy (09/21/2022): Diverticulosis in the sigmoid colon, descending colon and splenic flexure.  10 mm tubular adenoma of the cecum removed.  2.  Social/family history: - Lives at home with his daughter-in-law.  Independent of ADLs and IADLs. - Current active smoker, 1 pack/day started at age 21. - Sister had colon cancer.  Mother had breast cancer and brother had liver/lung cancer.  Plan:  1.  Severe iron  deficiency anemia: -Received 1 g INFeD  on 09/06/2023 and again on 10/23/2023 with good tolerance. -Labs from 04/02/2024 show hemoglobin of 11.0 (12.0) with normal differential.  Iron  saturation is 17% (35%) with a ferritin of 30 (95).  TIBC 246. - Recommend 1 g INFeD  given drop in hemoglobin, iron  saturation and ferritin. -I would recommend continuing oral iron  supplements as he is tolerating these fine.  Continue 1 tab ferrous sulfate  325 mg daily.  Take MiraLAX  for constipation. -Recommend follow-up in 3 to 4 months with labs (CBC/D, iron  panel and ferritin) a few days before.   Orders Placed This Encounter  Procedures   CBC with Differential    Standing Status:   Future    Expected Date:   07/02/2024    Expiration Date:   04/09/2025   Iron  and TIBC ([facility] ONLY)    Standing Status:   Future    Expected Date:   07/02/2024    Expiration Date:    04/09/2025   Ferritin    Standing Status:   Future    Expected Date:   07/02/2024    Expiration Date:   04/09/2025    Ricky FORBES Hope, NP   7/24/20259:49 AM  CHIEF COMPLAINT/PURPOSE OF CONSULT:   Diagnosis: Iron  deficiency anemia from blood loss  Current Therapy: INFeD   HISTORY OF PRESENT ILLNESS:   [name] is a 78 y.o. male presenting to clinic today for follow-up for iron  deficiency anemia.    Patient denies any hospitalizations, surgeries or changes to baseline health.  He was seen by gastroenterology on 02/12/2024 for chronic constipation and IDA.  He is continuing his PPI and avoiding NSAIDs.  He is currently no longer taking Celebrex  and Plavix .  He continues iron  supplements 325 mg ferrous sulfate  daily with good tolerance.  Reports overall doing well.  Appetite is 100% energy level 75%.  He quit smoking about a month ago and is doing well.  Has generalized pain all over since he stopped taking his Celebrex .  Has chronic cough and sleep problems.  He denies any dark stools or bright red blood per rectum.  Reports he is followed by gastroenterology for this.  Reports he has a slow GI bleed which is likely causing his anemia.  He has not noticed any bright red blood per rectum.   PAST MEDICAL HISTORY:   Past Medical History: Past Medical History:  Diagnosis Date   Anemia  Iron  Deficiency   Bilateral renal cysts    Bladder cancer (HCC) UROLOGIST-  [facility]   RECURRENT 11/ 2017 --  hx turbt in 2004 and 08-19-2013   BPH without obstruction/lower urinary tract symptoms    Cervical fusion syndrome    LEFT ARM NUMBNESS--  CONTROLLED W/ GABAPENTIN    Complication of anesthesia    Coronary artery disease    CARDIOLOGIST-- DR Ricky ESPIRIDION JACOBS)   DDD (degenerative disc disease), cervical    DDD (degenerative disc disease), lumbar    Diverticulosis of sigmoid colon    Heart murmur    slight murmur per patient   History of acute gouty arthritis    History of  adenomatous polyp of colon    2001;  2012- non-malignant leiomyoma;  04-07-2014  tubualr adenoma and hyperplastic polyp's   History of prostate cancer UROLOGIST-  [facility]   S/P  RADIOACTIVE SEED IMPLANTS 2004   HOH (hard of hearing)    no hearing aids   Hyperlipidemia    Hypertension    OA (osteoarthritis)    Occlusion and stenosis of carotid artery without mention of cerebral infarction CARDIOLOGIST  -- DR  DORN BRANCH   LAST DUPLEX  11-23-2015  RICA  (DUPLEX DOPPLER 01-20-2016 RICA 60-79% PROXIMAL) /   LICA <50% (HAD CONSULT W/ DR LAURENCE , VASCULAR SURGEON 01-20-2016)   OSA on CPAP    BiPAP   Peripheral vascular disease (HCC)    Carotid Artery Stenosis   PONV (postoperative nausea and vomiting)    Prostate cancer (HCC)    per patient years ago- had radiation implant   Spondylosis, cervical    Type 2 diabetes mellitus (HCC)    Wears glasses     Surgical History: Past Surgical History:  Procedure Laterality Date   ANTERIOR REMOVAL CAGE AND PLATE R6-R2/ CORPECTOMY C7 (FX)/  ALLOGRAFT AND FUSION C3 -- T1/  POSTERIOR DECOMPRESSION BILATERAL LAMINECTOMY C4 -- 6 & PARTIAL C3/  POSTEROLATERAL ARTHRODESIS C3 - T1  06/05/2005   APPENDECTOMY  1990'S   BIOPSY  09/21/2022   Procedure: BIOPSY;  Surgeon: Ricky Lamar HERO, MD;  [facility];  Service: Endoscopy;;   CATARACT EXTRACTION W/ INTRAOCULAR LENS  IMPLANT, BILATERAL  2013   CERVICAL FUSION  05/11/2005   C3  --  C7/  due to post op quadriparesis same day s/p  anterior C 4,5,6, colpectomy, decompression, removal epidural hematoma, foraminnotomy, cage and plate   COLONOSCOPY  last one 04-07-2014   COLONOSCOPY WITH PROPOFOL  N/A 09/21/2022   Procedure: COLONOSCOPY WITH PROPOFOL ;  Surgeon: Ricky Lamar HERO, MD;  [facility];  Service: Endoscopy;  Laterality: N/A;   CYSTOSCOPY W/ RETROGRADES Bilateral 08/19/2013   Procedure: CYSTOSCOPY WITH RETROGRADE PYELOGRAM;  Surgeon: Ricky Neysa Repine, MD;  Location: [facility];  Service: Urology;  Laterality: Bilateral;   CYSTOSCOPY W/ RETROGRADES Bilateral 07/18/2016   Procedure: CYSTOSCOPY WITH RETROGRADE PYELOGRAM;  Surgeon: Ricardo Likens, MD;  Location: [facility];  Service: Urology;  Laterality: Bilateral;   ENDOSCOPIC REPAIR CSF LEAK VIA NASAL PASSAGE  2011   ESOPHAGOGASTRODUODENOSCOPY (EGD) WITH PROPOFOL  N/A 09/21/2022   Procedure: ESOPHAGOGASTRODUODENOSCOPY (EGD) WITH PROPOFOL ;  Surgeon: Ricky Lamar HERO, MD;  [facility];  Service: Endoscopy;  Laterality: N/A;   ESOPHAGOGASTRODUODENOSCOPY (EGD) WITH PROPOFOL  N/A 08/09/2023   Procedure: ESOPHAGOGASTRODUODENOSCOPY (EGD) WITH PROPOFOL ;  Surgeon: Ricky Lamar HERO, MD;  [facility];  Service: Endoscopy;  Laterality: N/A;   HEMOSTASIS CLIP PLACEMENT  09/21/2022   Procedure: HEMOSTASIS CLIP PLACEMENT;  Surgeon: Ricky Lamar HERO, MD;  [facility];  Service: Endoscopy;;   HOT HEMOSTASIS  08/09/2023   Procedure: HOT HEMOSTASIS (ARGON PLASMA COAGULATION/BICAP);  Surgeon: Ricky Lamar HERO, MD;  [facility];  Service: Endoscopy;;   INTRAOPERATIVE ARTERIOGRAM  CATH LAB  01-05-2009  DR LADONA   RICA   ACUTE ANGLE 80-85% STENOSIS   KNEE ARTHROSCOPY Left 07/15/2017   Procedure: ARTHROSCOPY LEFT  KNEE AND DEBRIDEMENT, medial meniscal tear and chondromalaita;  Surgeon: Ernie Cough, MD;  Location: [facility];  Service: Orthopedics;  Laterality: Left;  60 MINS   LUMBAR FUSION  03-26-2012   L3 --  L5   POLYPECTOMY  09/21/2022   Procedure: POLYPECTOMY;  Surgeon: Ricky Lamar HERO, MD;  [facility];  Service: Endoscopy;;   RADIOACTIVE PROSTATE SEED IMPLANTS  08-19-2003   SEPTOPLASTY  1998   TOTAL KNEE ARTHROPLASTY Left 02/25/2018   Procedure: LEFT TOTAL KNEE ARTHROPLASTY;  Surgeon: Ernie Cough, MD;  Location: [facility];  Service: Orthopedics;  Laterality: Left;  70 mins   TRANSCAROTID ARTERY REVASCULARIZATION  Right 05/11/2022   Procedure: Right  Transcarotid Artery Revascularization;  Surgeon: Sheree Penne Bruckner, MD;  Location: [facility];  Service: Vascular;  Laterality: Right;   TRANSCAROTID ARTERY REVASCULARIZATION  Left 07/19/2023   Procedure: LEFT TRANSCAROTID ARTERY REVASCULARIZATION;  Surgeon: Sheree Penne Bruckner, MD;  Location: [facility] OR;  Service: Vascular;  Laterality: Left;   TRANSURETHRAL RESECTION OF BLADDER TUMOR N/A 08/19/2013   Procedure: TRANSURETHRAL RESECTION OF BLADDER TUMOR (TURBT);  Surgeon: Ricky Neysa Repine, MD;  Location: [facility];  Service: Urology;  Laterality: N/A;   TRANSURETHRAL RESECTION OF BLADDER TUMOR N/A 07/18/2016   Procedure: TRANSURETHRAL RESECTION OF BLADDER TUMOR (TURBT);  Surgeon: Ricardo Likens, MD;  Location: [facility];  Service: Urology;  Laterality: N/A;   ULTRASOUND GUIDANCE FOR VASCULAR ACCESS Right 07/19/2023   Procedure: ULTRASOUND GUIDANCE FOR VASCULAR ACCESS, RIGHT FEMORAL VEIN;  Surgeon: Sheree Penne Bruckner, MD;  Location: [facility] OR;  Service: Vascular;  Laterality: Right;   YAG LASER APPLICATION  07/29/2012   Procedure: YAG LASER APPLICATION;  Surgeon: Oneil T. Roz, MD;  Location: [facility];  Service: Ophthalmology;  Laterality: Right;    Social History: Social History   Socioeconomic History   Marital status: Widowed    Spouse name: Not on file   Number of children: 2   Years of education: Not on file   Highest education level: Not on file  Occupational History   Occupation: retired//disabled  Tobacco Use   Smoking status: Every Day    Current packs/day: 1.00    Average packs/day: 1 pack/day for 47.0 years (47.0 ttl pk-yrs)    Types: Cigarettes   Smokeless tobacco: Never   Tobacco comments:    pt quits smoking periodically :: started smoking again Jan 2018 1ppd  Vaping Use   Vaping status: Never Used  Substance and Sexual Activity   Alcohol use: No    Alcohol/week: 0.0 standard drinks of alcohol   Drug use: No   Sexual  activity: Not Currently  Other Topics Concern   Not on file  Social History Narrative   Not on file   Social Drivers of Health   Financial Resource Strain: Not on file  Food Insecurity: No Food Insecurity (08/09/2023)   Hunger Vital Sign    Worried About Running Out of Food in the Last Year: Never true    Ran Out  of Food in the Last Year: Never true  Transportation Needs: No Transportation Needs (08/09/2023)   PRAPARE - Administrator, Civil Service (Medical): No    Lack of Transportation (Non-Medical): No  Physical Activity: Not on file  Stress: Not on file  Social Connections: Not on file  Intimate Partner Violence: Not At Risk (08/09/2023)   Humiliation, Afraid, Rape, and Kick questionnaire    Fear of Current or Ex-Partner: No    Emotionally Abused: No    Physically Abused: No    Sexually Abused: No    Family History: Family History  Problem Relation Age of Onset   Breast cancer Mother        mets   Diabetes Mother    Heart disease Father        MI   Colon cancer Brother    Cancer Sister        male organs   Diabetes Sister        family hx   Arthritis Other        entire family   Diabetes Brother        x 2    Current Medications:  Current Outpatient Medications:    acetaminophen  (TYLENOL ) 500 MG tablet, Take 1,000 mg by mouth every 6 (six) hours as needed for moderate pain., Disp: , Rfl:    albuterol  (VENTOLIN  HFA) 108 (90 Base) MCG/ACT inhaler, Inhale 2 puffs into the lungs every 4 (four) hours as needed for shortness of breath or wheezing., Disp: , Rfl:    allopurinol  (ZYLOPRIM ) 300 MG tablet, Take 300 mg by mouth at bedtime., Disp: , Rfl:    amLODipine  (NORVASC ) 10 MG tablet, TAKE 1 TABLET BY MOUTH EVERY DAY, Disp: 90 tablet, Rfl: 3   aspirin  EC 81 MG tablet, Take 1 tablet (81 mg total) by mouth daily with breakfast., Disp: 30 tablet, Rfl: 12   Budeson-Glycopyrrol-Formoterol (BREZTRI AEROSPHERE) 160-9-4.8 MCG/ACT AERO, Inhale 1 puff into the  lungs daily., Disp: , Rfl:    colchicine  0.6 MG tablet, Take 0.6 mg by mouth 2 (two) times daily as needed (gout). , Disp: , Rfl:    cyanocobalamin  (VITAMIN B12) 1000 MCG tablet, Take 1 tablet (1,000 mcg total) by mouth daily., Disp: 30 tablet, Rfl: 1   docusate sodium  (COLACE) 100 MG capsule, Take 100 mg by mouth at bedtime., Disp: , Rfl:    ferrous sulfate  325 (65 FE) MG EC tablet, Take 325 mg by mouth daily., Disp: , Rfl:    finasteride  (PROSCAR ) 5 MG tablet, Take 5 mg by mouth at bedtime., Disp: , Rfl:    furosemide  (LASIX ) 40 MG tablet, Take 40 mg by mouth every morning., Disp: , Rfl:    gabapentin  (NEURONTIN ) 300 MG capsule, Take 300 mg by mouth 2 (two) times daily., Disp: , Rfl:    Iron -FA-B Cmp-C-Biot-Probiotic (FUSION PLUS) CAPS, Take 1 capsule by mouth daily., Disp: , Rfl:    labetalol  (NORMODYNE ) 100 MG tablet, Take 100 mg by mouth at bedtime., Disp: , Rfl:    losartan  (COZAAR ) 100 MG tablet, Take 1 tablet (100 mg total) by mouth every evening. (Patient taking differently: Take 100 mg by mouth in the morning.), Disp: 30 tablet, Rfl: 6   metFORMIN  (GLUCOPHAGE -XR) 500 MG 24 hr tablet, Take 1 tablet (500 mg total) by mouth in the morning and at bedtime., Disp: , Rfl:    NON FORMULARY, Pt uses cpap, Disp: , Rfl:    Omega-3-6-9 CAPS, Take 1 capsule by mouth  2 (two) times daily., Disp: , Rfl:    oxyCODONE -acetaminophen  (PERCOCET/ROXICET) 5-325 MG tablet, TAKE 1 TABLET BY MOUTH EVERY 6 TO 8 HOURS AS NEEDED, Disp: , Rfl:    pantoprazole  (PROTONIX ) 40 MG tablet, Take 1 tablet (40 mg total) by mouth 2 (two) times daily. (Patient taking differently: Take 40 mg by mouth daily.), Disp: 180 tablet, Rfl: 3   polyethylene glycol powder (GLYCOLAX /MIRALAX ) powder, TAKE 17MG (1CAPFUL) BY MOUTH(MIXED WITH WATER HILLIARD) DAILY, Disp: 527 g, Rfl: 0   rosuvastatin  (CRESTOR ) 10 MG tablet, Take 10 mg by mouth every morning. , Disp: , Rfl:    tamsulosin  (FLOMAX ) 0.4 MG CAPS capsule, Take 0.4 mg by mouth daily.,  Disp: , Rfl:    Vitamin D, Ergocalciferol, (DRISDOL) 1.25 MG (50000 UNIT) CAPS capsule, Take 50,000 Units by mouth every Thursday., Disp: , Rfl:    Allergies: Allergies  Allergen Reactions   Dilaudid  [Hydromorphone  Hcl] Other (See Comments)    acts a little off   Lyrica [Pregabalin] Nausea Only and Other (See Comments)    Make me feel bad   Toradol  [Ketorolac  Tromethamine ] Other (See Comments)    Caused hallucination - states not sure / avoids per his MD   Tramadol  Other (See Comments)    Couldn't sleep    REVIEW OF SYSTEMS:   Review of Systems  Respiratory:  Positive for cough.   Musculoskeletal:  Positive for arthralgias and myalgias.  Psychiatric/Behavioral:  Positive for sleep disturbance.      VITALS:   Blood pressure (!) 154/71, pulse 76, temperature 98.3 F (36.8 C), temperature source Oral, resp. rate 19, weight 185 lb 10 oz (84.2 kg), SpO2 98%.  Wt Readings from Last 3 Encounters:  04/09/24 185 lb 10 oz (84.2 kg)  02/12/24 182 lb 6.4 oz (82.7 kg)  01/09/24 178 lb 12.7 oz (81.1 kg)    Body mass index is 29.07 kg/m.   PHYSICAL EXAM:   Physical Exam Constitutional:      Appearance: Normal appearance.  Cardiovascular:     Rate and Rhythm: Normal rate and regular rhythm.  Pulmonary:     Effort: Pulmonary effort is normal.     Breath sounds: Normal breath sounds.  Abdominal:     General: Bowel sounds are normal.     Palpations: Abdomen is soft.  Musculoskeletal:        General: No swelling. Normal range of motion.  Neurological:     Mental Status: He is alert and oriented to person, place, and time. Mental status is at baseline.     LABS:      Latest Ref Rng & Units 04/02/2024    9:04 AM 01/06/2024    8:43 AM 10/11/2023   10:22 AM  CBC  WBC 4.0 - 10.5 K/uL 6.8  7.0  8.1   Hemoglobin 13.0 - 17.0 g/dL 88.9  87.9  87.2   Hematocrit 39.0 - 52.0 % 33.6  37.0  38.6   Platelets 150 - 400 K/uL 166  171  167       Latest Ref Rng & Units 08/10/2023     3:59 AM 08/09/2023   10:27 AM 07/20/2023    4:25 AM  CMP  Glucose 70 - 99 mg/dL 96  855  870   BUN 8 - 23 mg/dL 27  39  24   Creatinine 0.61 - 1.24 mg/dL 8.88  8.81  8.75   Sodium 135 - 145 mmol/L 143  137  138   Potassium 3.5 - 5.1 mmol/L 4.4  4.1  3.9   Chloride 98 - 111 mmol/L 107  105  105   CO2 22 - 32 mmol/L 26  25  25    Calcium  8.9 - 10.3 mg/dL 8.9  8.8  9.0      No results found for: CEA1, CEA / No results found for: CEA1, CEA No results found for: PSA1 No results found for: CAN199 No results found for: CAN125  No results found for: STEPHANY RINGS, A1GS, A2GS, BETS, BETA2SER, GAMS, MSPIKE, SPEI Lab Results  Component Value Date   TIBC 295 04/02/2024   TIBC 281 01/06/2024   TIBC 308 10/11/2023   FERRITIN 30 04/02/2024   FERRITIN 95 01/06/2024   FERRITIN 39 10/11/2023   IRONPCTSAT 17 (L) 04/02/2024   IRONPCTSAT 35 01/06/2024   IRONPCTSAT 21 10/11/2023   No results found for: LDH   STUDIES:   No results found.

## 2024-04-13 ENCOUNTER — Inpatient Hospital Stay

## 2024-04-13 VITALS — BP 137/74 | HR 75 | Temp 97.0°F | Resp 18

## 2024-04-13 DIAGNOSIS — D500 Iron deficiency anemia secondary to blood loss (chronic): Secondary | ICD-10-CM

## 2024-04-13 DIAGNOSIS — K31811 Angiodysplasia of stomach and duodenum with bleeding: Secondary | ICD-10-CM | POA: Diagnosis not present

## 2024-04-13 MED ORDER — SODIUM CHLORIDE 0.9 % IV SOLN: INTRAVENOUS | Status: DC

## 2024-04-13 MED ORDER — SODIUM CHLORIDE 0.9 % IV SOLN
1000.0000 mg | Freq: Once | INTRAVENOUS | Status: AC
  Administered 2024-04-13: 1000 mg via INTRAVENOUS
  Filled 2024-04-13: qty 20

## 2024-04-13 MED ORDER — CETIRIZINE HCL 10 MG/ML IV SOLN
10.0000 mg | Freq: Once | INTRAVENOUS | Status: AC
  Administered 2024-04-13: 10 mg via INTRAVENOUS
  Filled 2024-04-13: qty 1

## 2024-04-13 MED ORDER — FAMOTIDINE IN NACL 20-0.9 MG/50ML-% IV SOLN
20.0000 mg | Freq: Once | INTRAVENOUS | Status: AC
  Administered 2024-04-13: 20 mg via INTRAVENOUS
  Filled 2024-04-13: qty 50

## 2024-04-13 MED ORDER — ACETAMINOPHEN 325 MG PO TABS
650.0000 mg | ORAL_TABLET | Freq: Once | ORAL | Status: AC
  Administered 2024-04-13: 650 mg via ORAL
  Filled 2024-04-13: qty 2

## 2024-04-13 MED ORDER — METHYLPREDNISOLONE SODIUM SUCC 125 MG IJ SOLR
125.0000 mg | Freq: Once | INTRAMUSCULAR | Status: AC
  Administered 2024-04-13: 125 mg via INTRAVENOUS
  Filled 2024-04-13: qty 2

## 2024-04-13 NOTE — Patient Instructions (Signed)

## 2024-04-13 NOTE — Progress Notes (Signed)
 Patient tolerated iron infusion with no complaints voiced.  Peripheral IV site clean and dry with good blood return noted before and after infusion.  Band aid applied.  VSS with discharge and left in satisfactory condition with no s/s of distress noted.

## 2024-05-07 ENCOUNTER — Encounter: Payer: Self-pay | Admitting: Cardiology

## 2024-05-07 ENCOUNTER — Ambulatory Visit: Attending: Cardiology | Admitting: Cardiology

## 2024-05-07 VITALS — BP 126/72 | HR 82 | Ht 67.0 in | Wt 182.8 lb

## 2024-05-07 DIAGNOSIS — I10 Essential (primary) hypertension: Secondary | ICD-10-CM

## 2024-05-07 DIAGNOSIS — E782 Mixed hyperlipidemia: Secondary | ICD-10-CM

## 2024-05-07 DIAGNOSIS — I350 Nonrheumatic aortic (valve) stenosis: Secondary | ICD-10-CM

## 2024-05-07 DIAGNOSIS — I2510 Atherosclerotic heart disease of native coronary artery without angina pectoris: Secondary | ICD-10-CM

## 2024-05-07 DIAGNOSIS — I6523 Occlusion and stenosis of bilateral carotid arteries: Secondary | ICD-10-CM | POA: Diagnosis not present

## 2024-05-07 NOTE — Patient Instructions (Signed)
 Medication Instructions:   Continue all current medications.   Labwork:  none  Testing/Procedures:  Your physician has requested that you have an echocardiogram. Echocardiography is a painless test that uses sound waves to create images of your heart. It provides your doctor with information about the size and shape of your heart and how well your heart's chambers and valves are working. This procedure takes approximately one hour. There are no restrictions for this procedure. Please do NOT wear cologne, perfume, aftershave, or lotions (deodorant is allowed). Please arrive 15 minutes prior to your appointment time.  Please note: We ask at that you not bring children with you during ultrasound (echo/ vascular) testing. Due to room size and safety concerns, children are not allowed in the ultrasound rooms during exams. Our front office staff cannot provide observation of children in our lobby area while testing is being conducted. An adult accompanying a patient to their appointment will only be allowed in the ultrasound room at the discretion of the ultrasound technician under special circumstances. We apologize for any inconvenience.  Office will contact with results via phone, letter or mychart.     Follow-Up:  6 months   Any Other Special Instructions Will Be Listed Below (If Applicable).   If you need a refill on your cardiac medications before your next appointment, please call your pharmacy.

## 2024-05-07 NOTE — Progress Notes (Signed)
 Clinical Summary Mr. Molyneux is a 78 y.o.male seen today for follow up of the following medical problems.      1. HTN - dizzy spells have resolved with spreading out bp meds throughout the day -he is compliant with meds - no recent dizzy spells       2. Carotid stenosis - RICA >70%, LICA <50%.  - has been on ASA and plavix , presume due to his history cerebrovascular disease  12/2021 RICA 80-99%, LICA 60-79%    04/2022 right ICA stenting with Dr Sheree 05/2022 US   RICA mild, LICA 60-79% 07/2023 left TCAR  - upcoming appt in October       3. Hyperlipidemia - compliant with statin - reports most recent labs with pcp   11/2022 TC 117 TG 99 HDL 47 LDL 51 07/2023 TC 85 TG 85 HDL 37 LDL 31   4. OSA - compliant with CPAP - followed by Dr Jude   5. Prostate CA - history of prior seed implant, reports cancer free   6. CAD - incidental findings form prior chest CT in 2013. The patient has not had symptoms of angina - 05/2017 nuclear stress: possible mild inferoseptal ischemia vs artifact, low risk   - denies any cardiac chest pain   7. Moderate aortic aortic stenosis - 08/2021 echo moderate AS mean 27 mmHg, AVA VTI 1.05  07/2022 echo: LVEF 60-65%, moderate AS AVA VTI 1.15 mean grad 20 - 08/2023 echo: LVEF 60-65%, no WMAs, grade I dd, mod AS AVA TI 1, mean grad 34   8. AAA screen - male over 50 former smoker - 02/2014 CT showed no aneurysm   9. GI bleed - admit Jan 2024 with symptomatic anemia, GI bleed - Hgb 7, received 1 unit pRBCs - EGD mottled gastric mucosa no clear ulcer or bleeding source, colon divetriculi no clear bleed - he was on ASA/plavix /celebrex  at the time. Found to have hpylori gastiritis, completed treatment   - admit 07/2023 with GI bleed, Hgb 6.5. Given 2 units pRBCs - Underwent EGD on 08/09/2023--Normal esophagus, - Normal stomach, Duodenal AVMs x 2; ablated    10. Chronic back pain - on celebrex , has not been able to wean.  Past Medical  History:  Diagnosis Date   Anemia    Iron  Deficiency   Bilateral renal cysts    Bladder cancer (HCC) UROLOGIST-  DR [facility]   RECURRENT 11/ 2017 --  hx turbt in 2004 and 08-19-2013   BPH without obstruction/lower urinary tract symptoms    Cervical fusion syndrome    LEFT ARM NUMBNESS--  CONTROLLED W/ GABAPENTIN    Complication of anesthesia    Coronary artery disease    CARDIOLOGIST-- DR ALVAN ESPIRIDION JACOBS)   DDD (degenerative disc disease), cervical    DDD (degenerative disc disease), lumbar    Diverticulosis of sigmoid colon    Heart murmur    slight murmur per patient   History of acute gouty arthritis    History of adenomatous polyp of colon    2001;  2012- non-malignant leiomyoma;  04-07-2014  tubualr adenoma and hyperplastic polyp's   History of prostate cancer UROLOGIST-  DR [facility]   S/P  RADIOACTIVE SEED IMPLANTS 2004   HOH (hard of hearing)    no hearing aids   Hyperlipidemia    Hypertension    OA (osteoarthritis)    Occlusion and stenosis of carotid artery without mention of cerebral infarction CARDIOLOGIST  -- DR  DORN ALVAN  LAST DUPLEX  11-23-2015  RICA  (DUPLEX DOPPLER 01-20-2016 RICA 60-79% PROXIMAL) /   LICA <50% (HAD CONSULT W/ DR LAURENCE , VASCULAR SURGEON 01-20-2016)   OSA on CPAP    BiPAP   Peripheral vascular disease (HCC)    Carotid Artery Stenosis   PONV (postoperative nausea and vomiting)    Prostate cancer (HCC)    per patient years ago- had radiation implant   Spondylosis, cervical    Type 2 diabetes mellitus (HCC)    Wears glasses      Allergies  Allergen Reactions   Dilaudid  [Hydromorphone  Hcl] Other (See Comments)    acts a little off   Lyrica [Pregabalin] Nausea Only and Other (See Comments)    Make me feel bad   Toradol  [Ketorolac  Tromethamine ] Other (See Comments)    Caused hallucination - states not sure / avoids per his MD   Tramadol  Other (See Comments)    Couldn't sleep     Current Outpatient Medications   Medication Sig Dispense Refill   acetaminophen  (TYLENOL ) 500 MG tablet Take 1,000 mg by mouth every 6 (six) hours as needed for moderate pain.     albuterol  (VENTOLIN  HFA) 108 (90 Base) MCG/ACT inhaler Inhale 2 puffs into the lungs every 4 (four) hours as needed for shortness of breath or wheezing.     allopurinol  (ZYLOPRIM ) 300 MG tablet Take 300 mg by mouth at bedtime.     amLODipine  (NORVASC ) 10 MG tablet TAKE 1 TABLET BY MOUTH EVERY DAY 90 tablet 3   aspirin  EC 81 MG tablet Take 1 tablet (81 mg total) by mouth daily with breakfast. 30 tablet 12   Budeson-Glycopyrrol-Formoterol (BREZTRI AEROSPHERE) 160-9-4.8 MCG/ACT AERO Inhale 1 puff into the lungs daily.     colchicine  0.6 MG tablet Take 0.6 mg by mouth 2 (two) times daily as needed (gout).      cyanocobalamin  (VITAMIN B12) 1000 MCG tablet Take 1 tablet (1,000 mcg total) by mouth daily. 30 tablet 1   docusate sodium  (COLACE) 100 MG capsule Take 100 mg by mouth at bedtime.     ferrous sulfate  325 (65 FE) MG EC tablet Take 325 mg by mouth daily.     finasteride  (PROSCAR ) 5 MG tablet Take 5 mg by mouth at bedtime.     furosemide  (LASIX ) 40 MG tablet Take 40 mg by mouth every morning.     gabapentin  (NEURONTIN ) 300 MG capsule Take 300 mg by mouth 2 (two) times daily.     Iron -FA-B Cmp-C-Biot-Probiotic (FUSION PLUS) CAPS Take 1 capsule by mouth daily.     labetalol  (NORMODYNE ) 100 MG tablet Take 100 mg by mouth at bedtime.     losartan  (COZAAR ) 100 MG tablet Take 1 tablet (100 mg total) by mouth every evening. (Patient taking differently: Take 100 mg by mouth in the morning.) 30 tablet 6   metFORMIN  (GLUCOPHAGE -XR) 500 MG 24 hr tablet Take 1 tablet (500 mg total) by mouth in the morning and at bedtime.     NON FORMULARY Pt uses cpap     Omega-3-6-9 CAPS Take 1 capsule by mouth 2 (two) times daily.     oxyCODONE -acetaminophen  (PERCOCET/ROXICET) 5-325 MG tablet TAKE 1 TABLET BY MOUTH EVERY 6 TO 8 HOURS AS NEEDED     pantoprazole  (PROTONIX ) 40 MG  tablet Take 1 tablet (40 mg total) by mouth 2 (two) times daily. (Patient taking differently: Take 40 mg by mouth daily.) 180 tablet 3   polyethylene glycol powder (GLYCOLAX /MIRALAX ) powder TAKE 17MG (1CAPFUL) BY MOUTH(MIXED  WITH WATER /JUICE) DAILY 527 g 0   rosuvastatin  (CRESTOR ) 10 MG tablet Take 10 mg by mouth every morning.      tamsulosin  (FLOMAX ) 0.4 MG CAPS capsule Take 0.4 mg by mouth daily.     Vitamin D, Ergocalciferol, (DRISDOL) 1.25 MG (50000 UNIT) CAPS capsule Take 50,000 Units by mouth every Thursday.     No current facility-administered medications for this visit.     Past Surgical History:  Procedure Laterality Date   ANTERIOR REMOVAL CAGE AND PLATE R6-R2/ CORPECTOMY C7 (FX)/  ALLOGRAFT AND FUSION C3 -- T1/  POSTERIOR DECOMPRESSION BILATERAL LAMINECTOMY C4 -- 6 & PARTIAL C3/  POSTEROLATERAL ARTHRODESIS C3 - T1  06/05/2005   APPENDECTOMY  1990'S   BIOPSY  09/21/2022   Procedure: BIOPSY;  Surgeon: Shaaron Lamar HERO, MD;  [facility];  Service: Endoscopy;;   CATARACT EXTRACTION W/ INTRAOCULAR LENS  IMPLANT, BILATERAL  2013   CERVICAL FUSION  05/11/2005   C3  --  C7/  due to post op quadriparesis same day s/p  anterior C 4,5,6, colpectomy, decompression, removal epidural hematoma, foraminnotomy, cage and plate   COLONOSCOPY  last one 04-07-2014   COLONOSCOPY WITH PROPOFOL  N/A 09/21/2022   Procedure: COLONOSCOPY WITH PROPOFOL ;  Surgeon: Shaaron Lamar HERO, MD;  [facility];  Service: Endoscopy;  Laterality: N/A;   CYSTOSCOPY W/ RETROGRADES Bilateral 08/19/2013   Procedure: CYSTOSCOPY WITH RETROGRADE PYELOGRAM;  Surgeon: Toribio Neysa Repine, MD;  Location: [facility];  Service: Urology;  Laterality: Bilateral;   CYSTOSCOPY W/ RETROGRADES Bilateral 07/18/2016   Procedure: CYSTOSCOPY WITH RETROGRADE PYELOGRAM;  Surgeon: Ricardo Likens, MD;  Location: [facility];  Service: Urology;  Laterality: Bilateral;   ENDOSCOPIC REPAIR CSF LEAK VIA  NASAL PASSAGE  2011   ESOPHAGOGASTRODUODENOSCOPY (EGD) WITH PROPOFOL  N/A 09/21/2022   Procedure: ESOPHAGOGASTRODUODENOSCOPY (EGD) WITH PROPOFOL ;  Surgeon: Shaaron Lamar HERO, MD;  [facility];  Service: Endoscopy;  Laterality: N/A;   ESOPHAGOGASTRODUODENOSCOPY (EGD) WITH PROPOFOL  N/A 08/09/2023   Procedure: ESOPHAGOGASTRODUODENOSCOPY (EGD) WITH PROPOFOL ;  Surgeon: Shaaron Lamar HERO, MD;  [facility];  Service: Endoscopy;  Laterality: N/A;   HEMOSTASIS CLIP PLACEMENT  09/21/2022   Procedure: HEMOSTASIS CLIP PLACEMENT;  Surgeon: Shaaron Lamar HERO, MD;  [facility];  Service: Endoscopy;;   HOT HEMOSTASIS  08/09/2023   Procedure: HOT HEMOSTASIS (ARGON PLASMA COAGULATION/BICAP);  Surgeon: Shaaron Lamar HERO, MD;  [facility];  Service: Endoscopy;;   INTRAOPERATIVE ARTERIOGRAM  CATH LAB  01-05-2009  DR LADONA   RICA   ACUTE ANGLE 80-85% STENOSIS   KNEE ARTHROSCOPY Left 07/15/2017   Procedure: ARTHROSCOPY LEFT  KNEE AND DEBRIDEMENT, medial meniscal tear and chondromalaita;  Surgeon: Ernie Cough, MD;  Location: [facility];  Service: Orthopedics;  Laterality: Left;  60 MINS   LUMBAR FUSION  03-26-2012   L3 --  L5   POLYPECTOMY  09/21/2022   Procedure: POLYPECTOMY;  Surgeon: Shaaron Lamar HERO, MD;  [facility];  Service: Endoscopy;;   RADIOACTIVE PROSTATE SEED IMPLANTS  08-19-2003   SEPTOPLASTY  1998   TOTAL KNEE ARTHROPLASTY Left 02/25/2018   Procedure: LEFT TOTAL KNEE ARTHROPLASTY;  Surgeon: Ernie Cough, MD;  Location: [facility];  Service: Orthopedics;  Laterality: Left;  70 mins   TRANSCAROTID ARTERY REVASCULARIZATION  Right 05/11/2022   Procedure: Right Transcarotid Artery Revascularization;  Surgeon: Sheree Penne Bruckner, MD;  Location: [facility];  Service: Vascular;  Laterality: Right;   TRANSCAROTID ARTERY REVASCULARIZATION  Left  07/19/2023   Procedure: LEFT TRANSCAROTID ARTERY REVASCULARIZATION;  Surgeon: Sheree Penne Bruckner, MD;  Location: [facility] OR;   Service: Vascular;  Laterality: Left;   TRANSURETHRAL RESECTION OF BLADDER TUMOR N/A 08/19/2013   Procedure: TRANSURETHRAL RESECTION OF BLADDER TUMOR (TURBT);  Surgeon: Toribio Neysa Repine, MD;  Location: [facility];  Service: Urology;  Laterality: N/A;   TRANSURETHRAL RESECTION OF BLADDER TUMOR N/A 07/18/2016   Procedure: TRANSURETHRAL RESECTION OF BLADDER TUMOR (TURBT);  Surgeon: Ricardo Likens, MD;  [facility];  Service: Urology;  Laterality: N/A;   ULTRASOUND GUIDANCE FOR VASCULAR ACCESS Right 07/19/2023   Procedure: ULTRASOUND GUIDANCE FOR VASCULAR ACCESS, RIGHT FEMORAL VEIN;  Surgeon: Sheree Penne Bruckner, MD;  Location: [facility] OR;  Service: Vascular;  Laterality: Right;   YAG LASER APPLICATION  07/29/2012   Procedure: YAG LASER APPLICATION;  Surgeon: Oneil T. Roz, MD;  Location: [facility];  Service: Ophthalmology;  Laterality: Right;     Allergies  Allergen Reactions   Dilaudid  [Hydromorphone  Hcl] Other (See Comments)    acts a little off   Lyrica [Pregabalin] Nausea Only and Other (See Comments)    Make me feel bad   Toradol  [Ketorolac  Tromethamine ] Other (See Comments)    Caused hallucination - states not sure / avoids per his MD   Tramadol  Other (See Comments)    Couldn't sleep      Family History  Problem Relation Age of Onset   Breast cancer Mother        mets   Diabetes Mother    Heart disease Father        MI   Colon cancer Brother    Cancer Sister        male organs   Diabetes Sister        family hx   Arthritis Other        entire family   Diabetes Brother        x 2     Social History Mr. Dec reports that he has been smoking cigarettes. He has a 47 pack-year smoking history. He has never used smokeless tobacco. Mr. Nehme reports no history of alcohol use.   Review of Systems CONSTITUTIONAL: No weight loss, fever, chills, weakness or fatigue.  HEENT: Eyes: No visual loss, blurred vision, double  vision or yellow sclerae.No hearing loss, sneezing, congestion, runny nose or sore throat.  SKIN: No rash or itching.  CARDIOVASCULAR:  RESPIRATORY: No shortness of breath, cough or sputum.  GASTROINTESTINAL: No anorexia, nausea, vomiting or diarrhea. No abdominal pain or blood.  GENITOURINARY: No burning on urination, no polyuria NEUROLOGICAL: No headache, dizziness, syncope, paralysis, ataxia, numbness or tingling in the extremities. No change in bowel or bladder control.  MUSCULOSKELETAL: No muscle, back pain, joint pain or stiffness.  LYMPHATICS: No enlarged nodes. No history of splenectomy.  PSYCHIATRIC: No history of depression or anxiety.  ENDOCRINOLOGIC: No reports of sweating, cold or heat intolerance. No polyuria or polydipsia.  SABRA   Physical Examination There were no vitals filed for this visit. There were no vitals filed for this visit.  Gen: resting comfortably, no acute distress HEENT: no scleral icterus, pupils equal round and reactive, no palptable cervical adenopathy,  CV Resp: Clear to auscultation bilaterally GI: abdomen is soft, non-tender, non-distended, normal bowel sounds, no hepatosplenomegaly MSK: extremities are warm, no edema.  Skin: warm, no rash Neuro:  no focal deficits Psych: appropriate affect   Diagnostic Studies  05/2017 nuclear stress No diagnostic ST segment changes  indicating ischemia. No arrhythmias. Moderate-sized, mild intensity, inferior and inferoseptal defects that are largely fixed with a partial region of reversibility in the mid inferoseptal zone. Normal wall motion in this area argues against scar. Although diaphragmatic attenuation is likely playing a role, consider a small region of inferoseptal ischemia. This is a low risk study. Nuclear stress EF: 64%.     07/2022 echo 1. Left ventricular ejection fraction, by estimation, is 60 to 65%. The  left ventricle has normal function. The left ventricle has no regional  wall motion  abnormalities. There is mild left ventricular hypertrophy.  Left ventricular diastolic parameters  are indeterminate.   2. Right ventricular systolic function is normal. The right ventricular  size is normal. There is normal pulmonary artery systolic pressure.   3. Aortic valve leaflets are densely calcified with moderate to severe  restriction of the leaflet mobility. There is moderate aortic valve  stenosis present with calculated aortic valve area of 1.15 cm2, Vmax 2.9  m/sec and mean pressure gradient of 20  mmHg.   4. The inferior vena cava is normal in size with greater than 50%  respiratory variability, suggesting right atrial pressure of 3 mmHg.    08/2023 echo 1. Left ventricular ejection fraction, by estimation, is 60 to 65%. The  left ventricle has normal function. The left ventricle has no regional  wall motion abnormalities. There is moderate left ventricular hypertrophy.  Left ventricular diastolic  parameters are consistent with Grade I diastolic dysfunction (impaired  relaxation). Elevated left ventricular end-diastolic pressure.   2. Right ventricular systolic function is normal. The right ventricular  size is normal. There is normal pulmonary artery systolic pressure.   3. Right atrial size was mildly dilated.   4. The mitral valve is abnormal. Trivial mitral valve regurgitation. No  evidence of mitral stenosis.   5. The aortic valve is calcified. There is severe calcifcation of the  aortic valve. Aortic valve regurgitation is not visualized. Moderate  aortic valve stenosis. Aortic valve area, by VTI measures 1.00 cm. Aortic  valve mean gradient measures 34.0 mmHg.   Aortic valve Vmax measures 3.65 m/s.   6. The inferior vena cava is normal in size with greater than 50%  respiratory variability, suggesting right atrial pressure of 3 mmHg.     Assessment and Plan  1 . HTN -at goal, continue current meds     2. Carotid stenosis -history of bilateral stenting  to carotids, followed by vascular - DAPT per vascular, not on for cardiac reason.    3. Hyperlipidemia -at goal, continue current meds   4. CAD - incidental finding on CT scan, he has not had any cardiac symptoms - prior low risk stress test - no cardiac symptoms, continue current meds   5. Aortic stenosis - moderate by US ,we will repeat echo      Dorn PHEBE Ross, M.D.

## 2024-05-26 ENCOUNTER — Other Ambulatory Visit: Payer: Self-pay

## 2024-05-26 DIAGNOSIS — I6529 Occlusion and stenosis of unspecified carotid artery: Secondary | ICD-10-CM

## 2024-06-05 ENCOUNTER — Ambulatory Visit (HOSPITAL_BASED_OUTPATIENT_CLINIC_OR_DEPARTMENT_OTHER): Admitting: Pulmonary Disease

## 2024-06-05 ENCOUNTER — Encounter (HOSPITAL_BASED_OUTPATIENT_CLINIC_OR_DEPARTMENT_OTHER): Payer: Self-pay | Admitting: Pulmonary Disease

## 2024-06-05 VITALS — BP 152/64 | HR 66 | Ht 67.0 in | Wt 176.0 lb

## 2024-06-05 DIAGNOSIS — R053 Chronic cough: Secondary | ICD-10-CM | POA: Diagnosis not present

## 2024-06-05 DIAGNOSIS — G4733 Obstructive sleep apnea (adult) (pediatric): Secondary | ICD-10-CM | POA: Diagnosis not present

## 2024-06-05 DIAGNOSIS — F17210 Nicotine dependence, cigarettes, uncomplicated: Secondary | ICD-10-CM

## 2024-06-05 DIAGNOSIS — Z23 Encounter for immunization: Secondary | ICD-10-CM

## 2024-06-05 DIAGNOSIS — J4489 Other specified chronic obstructive pulmonary disease: Secondary | ICD-10-CM

## 2024-06-05 NOTE — Patient Instructions (Addendum)
  Take Breztri 2 puffs twice daily  Call for prednisone  if cough persists  X CT chest wo con @ [place] Xschedule spirometry X flu shot

## 2024-06-05 NOTE — Addendum Note (Signed)
 Addended by: ALINDA WINN KIDD on: 06/05/2024 01:55 PM   Modules accepted: Orders

## 2024-06-05 NOTE — Progress Notes (Signed)
   Subjective:    Patient ID: [name], male    DOB: Feb 08, 1946, 78 y.o.   MRN: [id number]   78 yo smoker for FU of OSA  -wife Levorn died of lung cancer July 06, 2023   PMH -diabetes, prostate cancer ,tremors CAD Mod AS UGIB >> duodenal AVMs ablated 07/2023, fe def anemia Carotid stenosis  Diagnosed with moderate obstructive sleep apnea in July 05, 2002 Currently on BiPAP of 14/11  2y  FU visit He is living in his son's house upstairs. He continues to lose some weight down to 176 pounds.  He has been diagnosed with iron  deficiency anemia, AVMs were found in his duodenum during a hospital admission on EGD and these were ablated.  He is getting iron  infusions. He reports a chronic cough for the last 2 months he received a course of antibiotics and prednisone .  He quit smoking for a month then resumed. He is using Breztri only on a as needed basis  He reports good compliance with his BiPAP machine denies any problems with mask or pressure. BiPAP download shows good control of events on 14/11 more than 7 hours per night without a single missed night.  He has a large leak   Significant tests/ events reviewed NPSG 2002/07/05 >AHI 16/hr  06/2022 CT chest wo con >> stable RLL 5mm nodule since 05-Jul-2012 , emphysema   Review of Systems  neg for any significant sore throat, dysphagia, itching, sneezing, nasal congestion or excess/ purulent secretions, fever, chills, sweats, unintended wt loss, pleuritic or exertional cp, hempoptysis, orthopnea pnd or change in chronic leg swelling. Also denies presyncope, palpitations, heartburn, abdominal pain, nausea, vomiting, diarrhea or change in bowel or urinary habits, dysuria,hematuria, rash, arthralgias, visual complaints, headache, numbness weakness or ataxia.      Objective:   Physical Exam  Gen. Pleasant, well-nourished, in no distress ENT - no thrush, no pallor/icterus,no post nasal drip Neck: No JVD, no thyromegaly, no carotid bruits Lungs: no use of accessory  muscles, no dullness to percussion, clear without rales or rhonchi  Cardiovascular: Rhythm regular, heart sounds  normal, no murmurs or gallops, no peripheral edema Musculoskeletal: No deformities, no cyanosis or clubbing        Assessment & Plan:   Assessment and Plan Assessment & Plan  OSA -he has lost significant weight.  I offered him repeat testing but he is feels like he still needs his BiPAP and does not want repeat testing. He is very compliant and BiPAP is certainly helped improve his daytime somnolence and fatigue.  Chronic cough -unclear cause.  He is a heavy smoker.  Previous CT scan has shown emphysema. Will proceed with PFTs. Will also proceed with follow-up CT chest without contrast for a 5 mm right lower lobe nodule noted prior and for his due to his weight loss and chronic cough that has been persistent for more than 8 weeks If cough remains persistent we can do a course of prednisone

## 2024-06-18 ENCOUNTER — Encounter: Payer: Self-pay | Admitting: Oncology

## 2024-06-20 ENCOUNTER — Ambulatory Visit (HOSPITAL_COMMUNITY)
Admission: RE | Admit: 2024-06-20 | Discharge: 2024-06-20 | Disposition: A | Source: Ambulatory Visit | Attending: Pulmonary Disease | Admitting: Pulmonary Disease

## 2024-06-20 DIAGNOSIS — R053 Chronic cough: Secondary | ICD-10-CM | POA: Diagnosis present

## 2024-06-22 NOTE — Progress Notes (Unsigned)
 Office Note     CC:  follow up Requesting Provider:  [name], MD  HPI: [name] is a 78 y.o. (29-Apr-1946) male who presents for surveillance follow up of carotid artery stenosis. He is s/p left TCAR for asymptomatic carotid artery stenosis on 07/19/2023 by Dr. Sheree.  He previously had right TCAR on 05/11/2022 also for asymptomatic carotid artery stenosis by Dr. Sheree   Today he denies any visual changes, slurred speech, facial drooping, unilateral upper or lower extremity weakness or numbness. He says he has been having some dull headaches recently but he is currently being treated for a sinus infection and he thinks it is related. He has follow up with his PCP tomorrow. He otherwise denies any pain in his legs on ambulation or rest. No tissue loss. He reports some generalized lower extremity weakness but he feels this is probably just age related. He also has extensive history of back surgeries with much of his cervical and lumber spine fused.   The pt is on a statin for cholesterol management.  The pt is on a daily aspirin .   Other AC:  Plavix  The pt is on CCB, diuretic, ARB, BB for hypertension.   The pt is  on medication for diabetes Tobacco hx: current, 1 ppd  Past Medical History:  Diagnosis Date   Anemia    Iron  Deficiency   Bilateral renal cysts    Bladder cancer (HCC) UROLOGIST-  DR [facility]   RECURRENT 11/ 2017 --  hx turbt in 2004 and 08-19-2013   BPH without obstruction/lower urinary tract symptoms    Cervical fusion syndrome    LEFT ARM NUMBNESS--  CONTROLLED W/ GABAPENTIN    Complication of anesthesia    Coronary artery disease    CARDIOLOGIST-- DR ALVAN ESPIRIDION JACOBS)   DDD (degenerative disc disease), cervical    DDD (degenerative disc disease), lumbar    Diverticulosis of sigmoid colon    Heart murmur    slight murmur per patient   History of acute gouty arthritis    History of adenomatous polyp of colon    2001;  2012- non-malignant leiomyoma;   04-07-2014  tubualr adenoma and hyperplastic polyp's   History of prostate cancer UROLOGIST-  DR [facility]   S/P  RADIOACTIVE SEED IMPLANTS 2004   HOH (hard of hearing)    no hearing aids   Hyperlipidemia    Hypertension    OA (osteoarthritis)    Occlusion and stenosis of carotid artery without mention of cerebral infarction CARDIOLOGIST  -- DR  DORN BRANCH   LAST DUPLEX  11-23-2015  RICA  (DUPLEX DOPPLER 01-20-2016 RICA 60-79% PROXIMAL) /   LICA <50% (HAD CONSULT W/ DR LAURENCE , VASCULAR SURGEON 01-20-2016)   OSA on CPAP    BiPAP   Peripheral vascular disease    Carotid Artery Stenosis   PONV (postoperative nausea and vomiting)    Prostate cancer (HCC)    per patient years ago- had radiation implant   Spondylosis, cervical    Type 2 diabetes mellitus (HCC)    Wears glasses     Past Surgical History:  Procedure Laterality Date   ANTERIOR REMOVAL CAGE AND PLATE R6-R2/ CORPECTOMY C7 (FX)/  ALLOGRAFT AND FUSION C3 -- T1/  POSTERIOR DECOMPRESSION BILATERAL LAMINECTOMY C4 -- 6 & PARTIAL C3/  POSTEROLATERAL ARTHRODESIS C3 - T1  06/05/2005   APPENDECTOMY  1990'S   BIOPSY  09/21/2022   Procedure: BIOPSY;  Surgeon: Shaaron Lamar HERO, MD;  [facility];  Service: Endoscopy;;   CATARACT EXTRACTION W/ INTRAOCULAR LENS  IMPLANT, BILATERAL  2013   CERVICAL FUSION  05/11/2005   C3  --  C7/  due to post op quadriparesis same day s/p  anterior C 4,5,6, colpectomy, decompression, removal epidural hematoma, foraminnotomy, cage and plate   COLONOSCOPY  last one 04-07-2014   COLONOSCOPY WITH PROPOFOL  N/A 09/21/2022   Procedure: COLONOSCOPY WITH PROPOFOL ;  Surgeon: Shaaron Lamar HERO, MD;  [facility];  Service: Endoscopy;  Laterality: N/A;   CYSTOSCOPY W/ RETROGRADES Bilateral 08/19/2013   Procedure: CYSTOSCOPY WITH RETROGRADE PYELOGRAM;  Surgeon: Toribio Neysa Repine, MD;  Location: [facility];  Service: Urology;  Laterality: Bilateral;   CYSTOSCOPY W/ RETROGRADES  Bilateral 07/18/2016   Procedure: CYSTOSCOPY WITH RETROGRADE PYELOGRAM;  Surgeon: Ricardo Likens, MD;  Location[facility];  Service: Urology;  Laterality: Bilateral;   ENDOSCOPIC REPAIR CSF LEAK VIA NASAL PASSAGE  2011   ESOPHAGOGASTRODUODENOSCOPY (EGD) WITH PROPOFOL  N/A 09/21/2022   Procedure: ESOPHAGOGASTRODUODENOSCOPY (EGD) WITH PROPOFOL ;  Surgeon: Shaaron Lamar HERO, MD;  [facility];  Service: Endoscopy;  Laterality: N/A;   ESOPHAGOGASTRODUODENOSCOPY (EGD) WITH PROPOFOL  N/A 08/09/2023   Procedure: ESOPHAGOGASTRODUODENOSCOPY (EGD) WITH PROPOFOL ;  Surgeon: Shaaron Lamar HERO, MD;  [facility];  Service: Endoscopy;  Laterality: N/A;   HEMOSTASIS CLIP PLACEMENT  09/21/2022   Procedure: HEMOSTASIS CLIP PLACEMENT;  Surgeon: Shaaron Lamar HERO, MD;  [facility];  Service: Endoscopy;;   HOT HEMOSTASIS  08/09/2023   Procedure: HOT HEMOSTASIS (ARGON PLASMA COAGULATION/BICAP);  Surgeon: Shaaron Lamar HERO, MD;  [facility];  Service: Endoscopy;;   INTRAOPERATIVE ARTERIOGRAM  CATH LAB  01-05-2009  DR LADONA   RICA   ACUTE ANGLE 80-85% STENOSIS   KNEE ARTHROSCOPY Left 07/15/2017   Procedure: ARTHROSCOPY LEFT  KNEE AND DEBRIDEMENT, medial meniscal tear and chondromalaita;  Surgeon: Ernie Cough, MD;  Location: [facility];  Service: Orthopedics;  Laterality: Left;  60 MINS   LUMBAR FUSION  03-26-2012   L3 --  L5   POLYPECTOMY  09/21/2022   Procedure: POLYPECTOMY;  Surgeon: Shaaron Lamar HERO, MD;  [facility];  Service: Endoscopy;;   RADIOACTIVE PROSTATE SEED IMPLANTS  08-19-2003   SEPTOPLASTY  1998   TOTAL KNEE ARTHROPLASTY Left 02/25/2018   Procedure: LEFT TOTAL KNEE ARTHROPLASTY;  Surgeon: Ernie Cough, MD;  Location: [facility];  Service: Orthopedics;  Laterality: Left;  70 mins   TRANSCAROTID ARTERY REVASCULARIZATION  Right 05/11/2022   Procedure: Right Transcarotid Artery Revascularization;  Surgeon: Sheree Penne Bruckner, MD;  Location: [facility] OR;   Service: Vascular;  Laterality: Right;   TRANSCAROTID ARTERY REVASCULARIZATION  Left 07/19/2023   Procedure: LEFT TRANSCAROTID ARTERY REVASCULARIZATION;  Surgeon: Sheree Penne Bruckner, MD;  Location: [facility] OR;  Service: Vascular;  Laterality: Left;   TRANSURETHRAL RESECTION OF BLADDER TUMOR N/A 08/19/2013   Procedure: TRANSURETHRAL RESECTION OF BLADDER TUMOR (TURBT);  Surgeon: Toribio Neysa Repine, MD;  Location[facility];  Service: Urology;  Laterality: N/A;   TRANSURETHRAL RESECTION OF BLADDER TUMOR N/A 07/18/2016   Procedure: TRANSURETHRAL RESECTION OF BLADDER TUMOR (TURBT);  Surgeon: Ricardo Likens, MD;  Location: [facility];  Service: Urology;  Laterality: N/A;   ULTRASOUND GUIDANCE FOR VASCULAR ACCESS Right 07/19/2023   Procedure: ULTRASOUND GUIDANCE FOR VASCULAR ACCESS, RIGHT FEMORAL VEIN;  Surgeon: Sheree Penne Bruckner, MD;  Location: [facility] OR;  Service: Vascular;  Laterality: Right;   YAG LASER APPLICATION  07/29/2012   Procedure: YAG  LASER APPLICATION;  Surgeon: Oneil T. Roz, MD;  Location: [facility];  Service: Ophthalmology;  Laterality: Right;    Social History   Socioeconomic History   Marital status: Widowed    Spouse name: Not on file   Number of children: 2   Years of education: Not on file   Highest education level: Not on file  Occupational History   Occupation: retired//disabled  Tobacco Use   Smoking status: Every Day    Current packs/day: 1.00    Average packs/day: 1 pack/day for 47.0 years (47.0 ttl pk-yrs)    Types: Cigarettes   Smokeless tobacco: Never   Tobacco comments:    pt quits smoking periodically :: started smoking again Jan 2018 1ppd  Vaping Use   Vaping status: Never Used  Substance and Sexual Activity   Alcohol use: No    Alcohol/week: 0.0 standard drinks of alcohol   Drug use: No   Sexual activity: Not Currently  Other Topics Concern   Not on file  Social History Narrative   Not on file   Social Drivers  of Health   Financial Resource Strain: Not on file  Food Insecurity: No Food Insecurity (08/09/2023)   Hunger Vital Sign    Worried About Running Out of Food in the Last Year: Never true    Ran Out of Food in the Last Year: Never true  Transportation Needs: No Transportation Needs (08/09/2023)   PRAPARE - Administrator, Civil Service (Medical): No    Lack of Transportation (Non-Medical): No  Physical Activity: Not on file  Stress: Not on file  Social Connections: Not on file  Intimate Partner Violence: Not At Risk (08/09/2023)   Humiliation, Afraid, Rape, and Kick questionnaire    Fear of Current or Ex-Partner: No    Emotionally Abused: No    Physically Abused: No    Sexually Abused: No    Family History  Problem Relation Age of Onset   Breast cancer Mother        mets   Diabetes Mother    Heart disease Father        MI   Colon cancer Brother    Cancer Sister        male organs   Diabetes Sister        family hx   Arthritis Other        entire family   Diabetes Brother        x 2    Current Outpatient Medications  Medication Sig Dispense Refill   acetaminophen  (TYLENOL ) 500 MG tablet Take 1,000 mg by mouth every 6 (six) hours as needed for moderate pain.     albuterol  (VENTOLIN  HFA) 108 (90 Base) MCG/ACT inhaler Inhale 2 puffs into the lungs every 4 (four) hours as needed for shortness of breath or wheezing.     allopurinol  (ZYLOPRIM ) 300 MG tablet Take 300 mg by mouth at bedtime.     amLODipine  (NORVASC ) 10 MG tablet TAKE 1 TABLET BY MOUTH EVERY DAY 90 tablet 3   aspirin  EC 81 MG tablet Take 1 tablet (81 mg total) by mouth daily with breakfast. 30 tablet 12   Budeson-Glycopyrrol-Formoterol (BREZTRI AEROSPHERE) 160-9-4.8 MCG/ACT AERO Inhale 1 puff into the lungs daily.     Cholecalciferol  (VITAMIN D3) 1.25 MG (50000 UT) CAPS Take 1 capsule by mouth once a week.     colchicine  0.6 MG tablet Take 0.6 mg by mouth 2 (two) times daily as needed (gout).  cyanocobalamin  (VITAMIN B12) 1000 MCG tablet Take 1 tablet (1,000 mcg total) by mouth daily. 30 tablet 1   docusate sodium  (COLACE) 100 MG capsule Take 100 mg by mouth at bedtime.     doxycycline (VIBRA-TABS) 100 MG tablet Take 100 mg by mouth 2 (two) times daily.     ferrous sulfate  325 (65 FE) MG EC tablet Take 325 mg by mouth daily.     finasteride  (PROSCAR ) 5 MG tablet Take 5 mg by mouth at bedtime.     furosemide  (LASIX ) 40 MG tablet Take 40 mg by mouth every morning.     gabapentin  (NEURONTIN ) 300 MG capsule Take 300 mg by mouth 2 (two) times daily.     HYDROcodone  bit-homatropine (HYCODAN) 5-1.5 MG/5ML syrup Take 5 mLs by mouth every 6 (six) hours as needed.     Iron -FA-B Cmp-C-Biot-Probiotic (FUSION PLUS) CAPS Take 1 capsule by mouth daily.     labetalol  (NORMODYNE ) 100 MG tablet Take 100 mg by mouth at bedtime.     NON FORMULARY Pt uses cpap     Omega-3-6-9 CAPS Take 1 capsule by mouth 2 (two) times daily.     oxyCODONE -acetaminophen  (PERCOCET/ROXICET) 5-325 MG tablet TAKE 1 TABLET BY MOUTH EVERY 6 TO 8 HOURS AS NEEDED     polyethylene glycol powder (GLYCOLAX /MIRALAX ) powder TAKE 17MG (1CAPFUL) BY MOUTH(MIXED WITH WATER HILLIARD) DAILY 527 g 0   rosuvastatin  (CRESTOR ) 10 MG tablet Take 10 mg by mouth every morning.      tamsulosin  (FLOMAX ) 0.4 MG CAPS capsule Take 0.4 mg by mouth daily.     Vitamin D, Ergocalciferol, (DRISDOL) 1.25 MG (50000 UNIT) CAPS capsule Take 50,000 Units by mouth every Thursday.     No current facility-administered medications for this visit.    Allergies  Allergen Reactions   Dilaudid  [Hydromorphone  Hcl] Other (See Comments)    acts a little off   Lyrica [Pregabalin] Nausea Only and Other (See Comments)    Make me feel bad   Toradol  [Ketorolac  Tromethamine ] Other (See Comments)    Caused hallucination - states not sure / avoids per his MD   Tramadol  Other (See Comments)    Couldn't sleep     REVIEW OF SYSTEMS:  Negative unless noted in HPI [X]   denotes positive finding, [ ]  denotes negative finding Cardiac  Comments:  Chest pain or chest pressure:    Shortness of breath upon exertion:    Short of breath when lying flat:    Irregular heart rhythm:        Vascular    Pain in calf, thigh, or hip brought on by ambulation:    Pain in feet at night that wakes you up from your sleep:     Blood clot in your veins:    Leg swelling:         Pulmonary    Oxygen at home:    Productive cough:     Wheezing:         Neurologic    Sudden weakness in arms or legs:     Sudden numbness in arms or legs:     Sudden onset of difficulty speaking or slurred speech:    Temporary loss of vision in one eye:     Problems with dizziness:         Gastrointestinal    Blood in stool:     Vomited blood:         Genitourinary    Burning when urinating:     Blood in urine:  Psychiatric    Major depression:         Hematologic    Bleeding problems:    Problems with blood clotting too easily:        Skin    Rashes or ulcers:        Constitutional    Fever or chills:      PHYSICAL EXAMINATION:  Vitals:   06/24/24 1443 06/24/24 1445  BP: 135/66 136/71  Pulse: 68 65  Temp: 97.9 F (36.6 C)   TempSrc: Temporal   Weight: 180 lb 4.8 oz (81.8 kg)     General:  WDWN in NAD; vital signs documented above Gait: Normal HENT: WNL, normocephalic Pulmonary: normal non-labored breathing Cardiac: regular HR, holosystolic murmur Abdomen: soft Vascular Exam/Pulses: 2+ radial, 2+ DP pulses bilaterally Extremities: without ischemic changes, without Gangrene , without cellulitis; without open wounds;  Musculoskeletal: no muscle wasting or atrophy  Neurologic: A&O X 3 Psychiatric:  The pt has Normal affect.   Non-Invasive Vascular Imaging:   VAS US  Carotid duplex: Summary:  Right Carotid: Patent stent with velocities suggestive of a <50% stenosis (top end of range).   Left Carotid: Patent stent with velocities suggestive of a 50-75%  stenosis (low end of range).   Vertebrals:  Bilateral vertebral arteries demonstrate antegrade flow.  Subclavians: Normal flow hemodynamics were seen in bilateral subclavian arteries.   ASSESSMENT/PLAN:: 78 y.o. male here for surveillance follow up of carotid artery stenosis. He is s/p left TCAR for asymptomatic carotid artery stenosis on 07/19/2023 by Dr. Sheree.  He previously had right TCAR on 05/11/2022 also for asymptomatic carotid artery stenosis by Dr. Sheree. He is without any TIA or stroke like symptoms  - Duplex today shows some elevated velocities on the Left ICA stent in the 50-75% range, right ICA with < 50% stenosis based on velocities. This has changed since last duplex 1 year ago. Normal flow in the vertebral and subclavian arteries bilaterally -continue Aspirin , Statin, Plavix  - encourage smoking cessation - Due to some velocity changes on his duplex today I have recommended closer interval follow up. I will have him return in 6 months with repeat carotid duplex   Teretha Damme, PA-C [facility] [phone]  Clinic MD:   Sheree

## 2024-06-23 ENCOUNTER — Ambulatory Visit: Payer: Self-pay | Admitting: Pulmonary Disease

## 2024-06-24 ENCOUNTER — Ambulatory Visit

## 2024-06-24 ENCOUNTER — Ambulatory Visit (HOSPITAL_COMMUNITY)
Admission: RE | Admit: 2024-06-24 | Discharge: 2024-06-24 | Disposition: A | Discharge status: Home / Self Care | Source: Ambulatory Visit | Attending: Vascular Surgery | Admitting: Vascular Surgery

## 2024-06-24 ENCOUNTER — Encounter: Payer: Self-pay | Admitting: Physician Assistant

## 2024-06-24 VITALS — BP 136/71 | HR 65 | Temp 97.9°F | Wt 180.3 lb

## 2024-06-24 DIAGNOSIS — I6523 Occlusion and stenosis of bilateral carotid arteries: Secondary | ICD-10-CM | POA: Insufficient documentation

## 2024-06-25 ENCOUNTER — Other Ambulatory Visit: Payer: Self-pay | Admitting: *Deleted

## 2024-06-25 DIAGNOSIS — I6523 Occlusion and stenosis of bilateral carotid arteries: Secondary | ICD-10-CM

## 2024-06-30 ENCOUNTER — Encounter: Payer: Self-pay | Admitting: Gastroenterology

## 2024-07-02 ENCOUNTER — Other Ambulatory Visit: Payer: Self-pay | Admitting: Gastroenterology

## 2024-08-03 ENCOUNTER — Inpatient Hospital Stay: Attending: Hematology

## 2024-08-03 ENCOUNTER — Inpatient Hospital Stay

## 2024-08-03 DIAGNOSIS — K31811 Angiodysplasia of stomach and duodenum with bleeding: Secondary | ICD-10-CM | POA: Insufficient documentation

## 2024-08-03 DIAGNOSIS — D508 Other iron deficiency anemias: Secondary | ICD-10-CM

## 2024-08-03 DIAGNOSIS — D500 Iron deficiency anemia secondary to blood loss (chronic): Secondary | ICD-10-CM | POA: Insufficient documentation

## 2024-08-03 LAB — CBC WITH DIFFERENTIAL/PLATELET
Abs Immature Granulocytes: 0.03 K/uL (ref 0.00–0.07)
Basophils Absolute: 0.1 K/uL (ref 0.0–0.1)
Basophils Relative: 1 %
Eosinophils Absolute: 0.2 K/uL (ref 0.0–0.5)
Eosinophils Relative: 3 %
HCT: 35 % — ABNORMAL LOW (ref 39.0–52.0)
Hemoglobin: 10.8 g/dL — ABNORMAL LOW (ref 13.0–17.0)
Immature Granulocytes: 0 %
Lymphocytes Relative: 17 %
Lymphs Abs: 1.3 K/uL (ref 0.7–4.0)
MCH: 30.7 pg (ref 26.0–34.0)
MCHC: 30.9 g/dL (ref 30.0–36.0)
MCV: 99.4 fL (ref 80.0–100.0)
Monocytes Absolute: 0.8 K/uL (ref 0.1–1.0)
Monocytes Relative: 10 %
Neutro Abs: 5.3 K/uL (ref 1.7–7.7)
Neutrophils Relative %: 69 %
Platelets: 205 K/uL (ref 150–400)
RBC: 3.52 MIL/uL — ABNORMAL LOW (ref 4.22–5.81)
RDW: 15.9 % — ABNORMAL HIGH (ref 11.5–15.5)
WBC: 7.7 K/uL (ref 4.0–10.5)
nRBC: 0 % (ref 0.0–0.2)

## 2024-08-03 LAB — IRON AND TIBC
Iron: 79 ug/dL (ref 45–182)
Saturation Ratios: 24 % (ref 17.9–39.5)
TIBC: 335 ug/dL (ref 250–450)
UIBC: 255 ug/dL

## 2024-08-03 LAB — FERRITIN: Ferritin: 32 ng/mL (ref 24–336)

## 2024-08-10 ENCOUNTER — Inpatient Hospital Stay: Admitting: Oncology

## 2024-08-11 ENCOUNTER — Ambulatory Visit: Admitting: Gastroenterology

## 2024-08-17 ENCOUNTER — Ambulatory Visit: Attending: Cardiology

## 2024-08-17 DIAGNOSIS — I350 Nonrheumatic aortic (valve) stenosis: Secondary | ICD-10-CM

## 2024-08-17 LAB — ECHOCARDIOGRAM COMPLETE
AR max vel: 0.99 cm2
AV Area VTI: 0.91 cm2
AV Area mean vel: 0.99 cm2
AV Mean grad: 41 mmHg
AV Peak grad: 65.3 mmHg
Ao pk vel: 4.04 m/s
Area-P 1/2: 2.22 cm2
Calc EF: 73 %
MV VTI: 1.53 cm2
S' Lateral: 3.3 cm
Single Plane A2C EF: 77 %
Single Plane A4C EF: 67.6 %

## 2024-08-18 ENCOUNTER — Inpatient Hospital Stay: Attending: Hematology | Admitting: Oncology

## 2024-08-18 VITALS — BP 155/81 | HR 83 | Temp 98.6°F | Resp 18 | Ht 67.0 in | Wt 175.0 lb

## 2024-08-18 DIAGNOSIS — D508 Other iron deficiency anemias: Secondary | ICD-10-CM | POA: Diagnosis not present

## 2024-08-18 DIAGNOSIS — D509 Iron deficiency anemia, unspecified: Secondary | ICD-10-CM | POA: Insufficient documentation

## 2024-08-18 NOTE — Progress Notes (Signed)
 [name] OFFICE PROGRESS NOTE  Ricky Norleen PEDLAR, MD  ASSESSMENT & PLAN:    Assessment & Plan Other iron  deficiency anemia -Received 1 g INFeD  on 09/06/2023, 10/23/2023 and 04/13/2024. -Labs from 08/03/2024 show hemoglobin of 10.8 (11.0) with normal differential.  Iron  saturation is 24% (17%) with a ferritin of 32 (30).  TIBC 335. - Recommend 1 g INFeD  given drop in hemoglobin, iron  saturation and ferritin. -Patient denies any obvious bleeding, melena or hematochezia. -I would recommend continuing oral iron  supplements as he is tolerating these fine.  Continue 1 tab ferrous sulfate  325 mg every other day with vitamin C.  Take MiraLAX  for constipation. -Recommend follow-up in 3 to 4 months with labs (CBC/D, iron  panel and ferritin) a few days before. -Will B12, and and copper levels at next visit as well.  Orders Placed This Encounter  Procedures   Ferritin    Standing Status:   Future    Expected Date:   12/12/2024    Expiration Date:   08/18/2025   Iron  and TIBC ([facility] ONLY)    Standing Status:   Future    Expected Date:   12/12/2024    Expiration Date:   08/18/2025   CBC with Differential    Standing Status:   Future    Expected Date:   12/12/2024    Expiration Date:   08/18/2025   Methylmalonic acid, serum    Standing Status:   Future    Expected Date:   11/16/2024    Expiration Date:   02/14/2025   Vitamin B12    Standing Status:   Future    Expected Date:   11/16/2024    Expiration Date:   02/14/2025   Copper, serum    Standing Status:   Future    Expected Date:   11/16/2024    Expiration Date:   02/14/2025    INTERVAL HISTORY: Patient returns for follow-up for iron  deficiency anemia.  Patient receives intermittent IV INFeD  last given on 04/13/2024.  Reports overall, tolerating it well.  Appetite and energy levels are 100%.  He has generalized body aches rates at a 5 out of 10.  He has a cough on occasion and shortness of breath with exertion.  He  has insomnia.  Reports he has follow-up with gastroenterology next Monday.  He last had a colonoscopy and upper endoscopy in January 2024 followed by an upper endoscopy in November 2024.  He has history of AVMs which is likely contributing to his iron  deficiency.  Colonoscopy showed multiple largemouth diverticula and 10 mm polyp.  He denies any obvious bleeding.  Reports unrelated to his IDA he has been having some vision changes and cannot drive at night any longer.  Reports he is living with his son and daughter which he is enjoying.  We reviewed ferritin, iron  panel and CBC with differential.  SUMMARY OF HEMATOLOGIC HISTORY: Oncology History   No history exists.    1.  Severe iron  deficiency anemia: - Recent admission from 08/09/2023 through 08/10/2023 with hemoglobin 6.5, status post 1 unit PRBC.  EGD with 2 duodenal AVMs cauterized. - Previous admission to the hospital in January 2024, s/p 1 unit PRBC. - Has been on fusion plus iron  tablet from the beginning of the year. - Colonoscopy (09/21/2022): Diverticulosis in the sigmoid colon, descending colon and splenic flexure.  10 mm tubular adenoma of the cecum removed.   2.  Social/family history: - Lives at home with his daughter-in-law.  Independent of ADLs and IADLs. - Current active smoker, 1 pack/day started at age 61. - Sister had colon cancer.  Mother had breast cancer and brother had liver/lung cancer. CBC    Component Value Date/Time   WBC 7.7 08/03/2024 1103   RBC 3.52 (L) 08/03/2024 1103   HGB 10.8 (L) 08/03/2024 1103   HCT 35.0 (L) 08/03/2024 1103   PLT 205 08/03/2024 1103   MCV 99.4 08/03/2024 1103   MCH 30.7 08/03/2024 1103   MCHC 30.9 08/03/2024 1103   RDW 15.9 (H) 08/03/2024 1103   LYMPHSABS 1.3 08/03/2024 1103   MONOABS 0.8 08/03/2024 1103   EOSABS 0.2 08/03/2024 1103   BASOSABS 0.1 08/03/2024 1103       Latest Ref Rng & Units 08/10/2023    3:59 AM 08/09/2023   10:27 AM 07/20/2023    4:25 AM  CMP   Glucose 70 - 99 mg/dL 96  855  870   BUN 8 - 23 mg/dL 27  39  24   Creatinine 0.61 - 1.24 mg/dL 8.88  8.81  8.75   Sodium 135 - 145 mmol/L 143  137  138   Potassium 3.5 - 5.1 mmol/L 4.4  4.1  3.9   Chloride 98 - 111 mmol/L 107  105  105   CO2 22 - 32 mmol/L 26  25  25    Calcium  8.9 - 10.3 mg/dL 8.9  8.8  9.0      Lab Results  Component Value Date   FERRITIN 32 08/03/2024   VITAMINB12 1,787 (H) 08/20/2023    Vitals:   08/18/24 1413 08/18/24 1421  BP: (!) 146/62 (!) 155/81  Pulse: 83   Resp: 18   Temp: 98.6 F (37 C)   SpO2: 98%     Review of System:  Review of Systems  Constitutional:  Positive for malaise/fatigue.  Eyes:  Positive for blurred vision.  Respiratory:  Positive for cough and shortness of breath.   Psychiatric/Behavioral:  The patient has insomnia.     Physical Exam: Physical Exam Constitutional:      Appearance: Normal appearance.  HENT:     Head: Normocephalic and atraumatic.  Eyes:     Pupils: Pupils are equal, round, and reactive to light.  Cardiovascular:     Rate and Rhythm: Normal rate and regular rhythm.     Heart sounds: Normal heart sounds. No murmur heard. Pulmonary:     Effort: Pulmonary effort is normal.     Breath sounds: Normal breath sounds. No wheezing.  Abdominal:     General: Bowel sounds are normal. There is no distension.     Palpations: Abdomen is soft.     Tenderness: There is no abdominal tenderness.  Musculoskeletal:        General: Normal range of motion.     Cervical back: Normal range of motion.  Skin:    General: Skin is warm and dry.     Findings: No rash.  Neurological:     Mental Status: He is alert and oriented to person, place, and time.     Gait: Gait is intact.  Psychiatric:        Mood and Affect: Mood and affect normal.        Cognition and Memory: Memory normal.        Judgment: Judgment normal.      I spent 25 minutes dedicated to the care of this patient (face-to-face and non-face-to-face) on  the date of the encounter to include what  is described in the assessment and plan.,  Ricky Hope, NP 08/18/2024 2:41 PM

## 2024-08-18 NOTE — Assessment & Plan Note (Addendum)
-  Received 1 g INFeD  on 09/06/2023, 10/23/2023 and 04/13/2024. -Labs from 08/03/2024 show hemoglobin of 10.8 (11.0) with normal differential.  Iron  saturation is 24% (17%) with a ferritin of 32 (30).  TIBC 335. - Recommend 1 g INFeD  given drop in hemoglobin, iron  saturation and ferritin. -Patient denies any obvious bleeding, melena or hematochezia. -I would recommend continuing oral iron  supplements as he is tolerating these fine.  Continue 1 tab ferrous sulfate  325 mg every other day with vitamin C.  Take MiraLAX  for constipation. -Recommend follow-up in 3 to 4 months with labs (CBC/D, iron  panel and ferritin) a few days before. -Will B12, and and copper levels at next visit as well.

## 2024-08-21 ENCOUNTER — Inpatient Hospital Stay

## 2024-08-21 VITALS — BP 145/67 | HR 68 | Temp 96.2°F | Resp 18

## 2024-08-21 DIAGNOSIS — D509 Iron deficiency anemia, unspecified: Secondary | ICD-10-CM | POA: Diagnosis not present

## 2024-08-21 DIAGNOSIS — D500 Iron deficiency anemia secondary to blood loss (chronic): Secondary | ICD-10-CM

## 2024-08-21 MED ORDER — SODIUM CHLORIDE 0.9 % IV SOLN
1000.0000 mg | Freq: Once | INTRAVENOUS | Status: AC
  Administered 2024-08-21: 1000 mg via INTRAVENOUS
  Filled 2024-08-21: qty 20

## 2024-08-21 MED ORDER — FAMOTIDINE IN NACL 20-0.9 MG/50ML-% IV SOLN
20.0000 mg | Freq: Once | INTRAVENOUS | Status: AC
  Administered 2024-08-21: 20 mg via INTRAVENOUS
  Filled 2024-08-21: qty 50

## 2024-08-21 MED ORDER — CETIRIZINE HCL 10 MG/ML IV SOLN
10.0000 mg | Freq: Once | INTRAVENOUS | Status: AC
  Administered 2024-08-21: 10 mg via INTRAVENOUS
  Filled 2024-08-21: qty 1

## 2024-08-21 MED ORDER — ACETAMINOPHEN 325 MG PO TABS: 650.0000 mg | ORAL_TABLET | Freq: Once | ORAL | Status: DC

## 2024-08-21 MED ORDER — METHYLPREDNISOLONE SODIUM SUCC 125 MG IJ SOLR
125.0000 mg | Freq: Once | INTRAMUSCULAR | Status: AC
  Administered 2024-08-21: 125 mg via INTRAVENOUS
  Filled 2024-08-21: qty 2

## 2024-08-21 MED ORDER — SODIUM CHLORIDE 0.9 % IV SOLN: INTRAVENOUS | Status: DC

## 2024-08-21 NOTE — Patient Instructions (Signed)
 Iron Dextran Injection What is this medication? IRON DEXTRAN (EYE ern DEX tran) treats low levels of iron in your body. Iron is a mineral that plays an important role in making red blood cells, which carry oxygen from your lungs to the rest of your body. This medicine may be used for other purposes; ask your health care provider or pharmacist if you have questions. COMMON BRAND NAME(S): Dexferrum, INFeD What should I tell my care team before I take this medication? They need to know if you have any of these conditions: Anemia not caused by low iron levels Heart disease High levels of iron in the blood Kidney disease Liver disease An unusual or allergic reaction to iron, other medications, foods, dyes, or preservatives Pregnant or trying to get pregnant Breastfeeding How should I use this medication? This medication is injected into a vein or a muscle. It is given by your care team in a hospital or clinic setting. Talk to your care team about the use of this medication in children. While it may be prescribed for children as young as 4 months for selected conditions, precautions do apply. Overdosage: If you think you have taken too much of this medicine contact a poison control center or emergency room at once. NOTE: This medicine is only for you. Do not share this medicine with others. What if I miss a dose? Keep appointments for follow-up doses. It is important not to miss your dose. Call your care team if you are unable to keep an appointment. What may interact with this medication? Do not take this medication with any of the following: Deferoxamine Dimercaprol Other iron products This medication may also interact with the following: Chloramphenicol Deferasirox This list may not describe all possible interactions. Give your health care provider a list of all the medicines, herbs, non-prescription drugs, or dietary supplements you use. Also tell them if you smoke, drink alcohol, or use  illegal drugs. Some items may interact with your medicine. What should I watch for while using this medication? Visit your care team for regular checks on your progress. Tell your care team if your symptoms do not start to get better or if they get worse. You may need blood work while taking this medication. You may need to eat more foods that contain iron. Talk to your care team. Foods that contain iron include whole grains/cereals, dried fruits, beans, peas, leafy green vegetables, and organ meats (liver, kidney). Long-term use of this medication may increase your risk of some cancers. Talk to your care team about your risk of cancer. What side effects may I notice from receiving this medication? Side effects that you should report to your care team as soon as possible: Allergic reactions--skin rash, itching, hives, swelling of the face, lips, tongue, or throat Low blood pressure--dizziness, feeling faint or lightheaded, blurry vision Shortness of breath Side effects that usually do not require medical attention (report to your care team if they continue or are bothersome): Flushing Headache Joint pain Muscle pain Nausea Pain, redness, or irritation at injection site This list may not describe all possible side effects. Call your doctor for medical advice about side effects. You may report side effects to FDA at 1-800-FDA-1088. Where should I keep my medication? This medication is given in a hospital or clinic. It will not be stored at home. NOTE: This sheet is a summary. It may not cover all possible information. If you have questions about this medicine, talk to your doctor, pharmacist, or health  care provider.  2024 [employer] (2023-04-24 00:00:00)

## 2024-08-21 NOTE — Progress Notes (Signed)
 Patient tolerated iron  infusion with no complaints voiced.  Peripheral IV site clean and dry with good blood return noted before and after infusion.  Pt observed for 10 minutes post iron  per request with no complications.  VSS with discharge and left in satisfactory condition with no s/s of distress noted. All follow ups as scheduled.   Artice Holohan

## 2024-08-24 ENCOUNTER — Encounter: Payer: Self-pay | Admitting: Gastroenterology

## 2024-08-24 ENCOUNTER — Ambulatory Visit: Admitting: Gastroenterology

## 2024-08-24 ENCOUNTER — Telehealth: Payer: Self-pay | Admitting: Gastroenterology

## 2024-08-24 VITALS — BP 169/72 | HR 76 | Temp 98.4°F | Ht 67.0 in | Wt 179.8 lb

## 2024-08-24 DIAGNOSIS — K5900 Constipation, unspecified: Secondary | ICD-10-CM | POA: Diagnosis not present

## 2024-08-24 DIAGNOSIS — K31819 Angiodysplasia of stomach and duodenum without bleeding: Secondary | ICD-10-CM | POA: Insufficient documentation

## 2024-08-24 DIAGNOSIS — D500 Iron deficiency anemia secondary to blood loss (chronic): Secondary | ICD-10-CM | POA: Diagnosis not present

## 2024-08-24 NOTE — Progress Notes (Signed)
 GI Office Note    Referring Provider: [name], MD Primary Care Physician:  [name], MD  Primary Gastroenterologist: Carlin POUR. Cindie, DO   Chief Complaint   Chief Complaint  Patient presents with   Follow-up    Doing well, no issues. Still on iron  infusions pt states.    History of Present Illness   [name] is a 78 y.o. male presenting today for follow up. Last seen 01/2024.   History of profound anemia (hemoglobin of 7) requiring hospitalization in January 2024, stool Hemoccult positive at that time. Found to have ferritin of 11, B12 156. Required blood transfusion. At discharge his hemoglobin was 7.8. Chronically had been on Celebrex , cannot function without it. Also on daily aspirin  81 mg, Plavix  75 mg daily. Completed EGD/TCS as outlined below. Confirmed h.pylori eradication 12/2022 via stool antigen.    When presented 08/09/23, his Hemoglobin 6.5 down from 9.6 on November 2. MCV 105.1.  Received 1 unit of packed red blood cells.  Hemoglobin up to 7.7 on November 23. EGD completed, two small bowel AVMS treated.  Previously on ASA, PLAVIX , and Celebrex , but stopped Plavix  and Celebrex .   Last iron  infusion 08/21/2024 (prior to that he had on on 09/06/2023, 10/23/2023, 04/13/24)  Per hematology, continue ferrous sulfate  325mg  every other day with vitamin C. He is also receiving Fusion Plus and Accrufer from his PCP. Started ACCRUFer 06/2024.    Discussed the use of AI scribe software for clinical note transcription with the patient, who gave verbal consent to proceed.  History of Present Illness   He is on three different iron -based medications, including over-the-counter ferrous sulfate  and two prescription iron  supplements. States his PCP gave him the two RX. No instructions to stop the others so he is taking all three. Recently had another IV iron  infusion, his 4th. Goes to see his PCP in 10/2024. Goes back to see hematology in 3-4 months and labs to include  CBC, iron , ferritin, B12, and copper.    He experiences symptoms of iron  deficiency such as decreased strength. He knows when his levels drop.  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 These were treated, but he could had additional lesions further down in the small bowel. He has not noticed any blood in his stool, although the iron  supplements make it difficult to discern due to darkened stool color. He denies hematuria or significant nosebleeds.  He denies any current use of blood thinners but continues to take an 81 mg baby aspirin  daily. He avoids NSAIDs due to previous bleeding issues and uses over-the-counter acetaminophen  for pain management.   He denies abdominal pain. No gerd,dysphagia. Takes miralax  each night and has daily soft stools.    Wt Readings from Last 3 Encounters:  08/24/24 179 lb 12.8 oz (81.6 kg)  08/18/24 175 lb (79.4 kg)  06/24/24 180 lb 4.8 oz (81.8 kg)      Prior Data     Results       Latest Ref Rng & Units 08/03/2024   11:03 AM 04/02/2024    9:04 AM 01/06/2024    8:43 AM  CBC  WBC 4.0 - 10.5 K/uL 7.7  6.8  7.0   Hemoglobin 13.0 - 17.0 g/dL 89.1  88.9  87.9   Hematocrit 39.0 - 52.0 % 35.0  33.6  37.0   Platelets 150 - 400 K/uL 205  166  171  Lab Results  Component Value Date   VITAMINB12 1,787 (H) 08/20/2023   Lab Results  Component Value Date   FOLATE >40.0 08/20/2023   Iron  30 (03/2024)-->128 (06/2024)-->79 (07/2024) Iron  sat  17 (03/2024)-->48 (06/2024)-->24 (07/2024) Ferritin 30 (03/2024)-->35 (06/2024)-->32 (07/2024) Hgb 11 (03/2024)-->9.7 (06/2024)-->10.8 (07/2024)  Prior endoscopic workup:  EGD November 2024 -Duodenal AVMs x 2 ablated   Colonoscopy 09/21/2022: - Diverticulosis in the sigmoid colon, in the descending colon and at the splenic flexure. - One 10 mm polyp in the cecum, removed with a cold snare. Resected and retrieved.  - The  examination was otherwise normal on direct and retroflexion views. Cannot rule out a diverticular bleed here. -Pathology revealed tubular adenomas -Recommend repeat in 3 years   EGD 09/21/2022: - Normal esophagus.  - Abnormal gastric mucosa of uncertain significance (Reticulated, mottled appearance of the gastric mucosa diffusely), status post biopsy.  - Normal duodenal bulb and second portion of the duodenum. -Biopsies revealed H. pylori gastritis -Patient treated with Pylera   Colonoscopy July 2015: -2 sessile polyps ranging 3 to 5 mm in the cecum and rectum (rectal polyp removed but not cecal polyp given on Plavix ) -Moderate to severe diverticulosis in the sigmoid colon -Complete resolution of inflammatory changes seen on CT scan -Cecal biopsy revealed to be a tubular adenoma, rectal polyp revealed to be hyperplastic    Medications   Current Outpatient Medications  Medication Sig Dispense Refill   ACCRUFER 30 MG CAPS Take 1 capsule by mouth 2 (two) times daily.     acetaminophen  (TYLENOL ) 500 MG tablet Take 1,000 mg by mouth every 6 (six) hours as needed for moderate pain.     albuterol  (VENTOLIN  HFA) 108 (90 Base) MCG/ACT inhaler Inhale 2 puffs into the lungs every 4 (four) hours as needed for shortness of breath or wheezing.     allopurinol  (ZYLOPRIM ) 300 MG tablet Take 300 mg by mouth at bedtime.     amLODipine  (NORVASC ) 10 MG tablet TAKE 1 TABLET BY MOUTH EVERY DAY 90 tablet 3   aspirin  EC 81 MG tablet Take 1 tablet (81 mg total) by mouth daily with breakfast. 30 tablet 12   Budeson-Glycopyrrol-Formoterol (BREZTRI AEROSPHERE) 160-9-4.8 MCG/ACT AERO Inhale 1 puff into the lungs daily.     Cholecalciferol  (VITAMIN D3) 1.25 MG (50000 UT) CAPS Take 1 capsule by mouth once a week.     colchicine  0.6 MG tablet Take 0.6 mg by mouth 2 (two) times daily as needed (gout).      cyanocobalamin  (VITAMIN B12) 1000 MCG tablet Take 1 tablet (1,000 mcg total) by mouth daily. 30 tablet 1    docusate sodium  (COLACE) 100 MG capsule Take 100 mg by mouth at bedtime.     ferrous sulfate  325 (65 FE) MG EC tablet Take 325 mg by mouth daily.     finasteride  (PROSCAR ) 5 MG tablet Take 5 mg by mouth at bedtime.     furosemide  (LASIX ) 40 MG tablet Take 40 mg by mouth every morning.     gabapentin  (NEURONTIN ) 300 MG capsule Take 300 mg by mouth 2 (two) times daily.     Iron -FA-B Cmp-C-Biot-Probiotic (FUSION PLUS) CAPS Take 1 capsule by mouth daily.     labetalol  (NORMODYNE ) 100 MG tablet Take 100 mg by mouth at bedtime.     losartan  (COZAAR ) 100 MG tablet Take 100 mg by mouth daily.     metFORMIN  (GLUCOPHAGE -XR) 500 MG 24 hr tablet TAKE ONE (1) TABLET BY MOUTH TWICE DAILY  NON FORMULARY Pt uses cpap     Omega-3-6-9 CAPS Take 1 capsule by mouth 2 (two) times daily.     pantoprazole  (PROTONIX ) 40 MG tablet TAKE 1 TABLET BY MOUTH DAILY BEFORE BREAKFAST 90 tablet 3   polyethylene glycol powder (GLYCOLAX /MIRALAX ) powder TAKE 17MG (1CAPFUL) BY MOUTH(MIXED WITH WATER HILLIARD) DAILY 527 g 0   rosuvastatin  (CRESTOR ) 10 MG tablet Take 10 mg by mouth every morning.      tamsulosin  (FLOMAX ) 0.4 MG CAPS capsule Take 0.4 mg by mouth daily.     No current facility-administered medications for this visit.    Allergies   Allergies as of 08/24/2024 - Review Complete 08/24/2024  Allergen Reaction Noted   Dilaudid  [hydromorphone  hcl] Other (See Comments) 04/06/2014   Lyrica [pregabalin] Nausea Only and Other (See Comments)    Toradol  [ketorolac  tromethamine ] Other (See Comments) 02/04/2014   Tramadol  Other (See Comments) 05/02/2022     Past Medical History   Past Medical History:  Diagnosis Date   Anemia    Iron  Deficiency   Bilateral renal cysts    Bladder cancer (HCC) UROLOGIST-  DR [facility]   RECURRENT 11/ 2017 --  hx turbt in 2004 and 08-19-2013   BPH without obstruction/lower urinary tract symptoms    Cervical fusion syndrome    LEFT ARM NUMBNESS--  CONTROLLED W/ GABAPENTIN    Complication of  anesthesia    Coronary artery disease    CARDIOLOGIST-- DR ALVAN ESPIRIDION JACOBS)   DDD (degenerative disc disease), cervical    DDD (degenerative disc disease), lumbar    Diverticulosis of sigmoid colon    Heart murmur    slight murmur per patient   History of acute gouty arthritis    History of adenomatous polyp of colon    2001;  2012- non-malignant leiomyoma;  04-07-2014  tubualr adenoma and hyperplastic polyp's   History of prostate cancer UROLOGIST-  DR [facility]   S/P  RADIOACTIVE SEED IMPLANTS 2004   HOH (hard of hearing)    no hearing aids   Hyperlipidemia    Hypertension    OA (osteoarthritis)    Occlusion and stenosis of carotid artery without mention of cerebral infarction CARDIOLOGIST  -- DR  DORN BRANCH   LAST DUPLEX  11-23-2015  RICA  (DUPLEX DOPPLER 01-20-2016 RICA 60-79% PROXIMAL) /   LICA <50% (HAD CONSULT W/ DR LAURENCE , VASCULAR SURGEON 01-20-2016)   OSA on CPAP    BiPAP   Peripheral vascular disease    Carotid Artery Stenosis   PONV (postoperative nausea and vomiting)    Prostate cancer (HCC)    per patient years ago- had radiation implant   Spondylosis, cervical    Type 2 diabetes mellitus (HCC)    Wears glasses     Past Surgical History   Past Surgical History:  Procedure Laterality Date   ANTERIOR REMOVAL CAGE AND PLATE R6-R2/ CORPECTOMY C7 (FX)/  ALLOGRAFT AND FUSION C3 -- T1/  POSTERIOR DECOMPRESSION BILATERAL LAMINECTOMY C4 -- 6 & PARTIAL C3/  POSTEROLATERAL ARTHRODESIS C3 - T1  06/05/2005   APPENDECTOMY  1990'S   BIOPSY  09/21/2022   Procedure: BIOPSY;  Surgeon: Shaaron Lamar HERO, MD;  [facility];  Service: Endoscopy;;   CATARACT EXTRACTION W/ INTRAOCULAR LENS  IMPLANT, BILATERAL  2013   CERVICAL FUSION  05/11/2005   C3  --  C7/  due to post op quadriparesis same day s/p  anterior C 4,5,6, colpectomy, decompression, removal epidural hematoma, foraminnotomy, cage and plate   COLONOSCOPY  last one  04-07-2014   COLONOSCOPY WITH  PROPOFOL  N/A 09/21/2022   Procedure: COLONOSCOPY WITH PROPOFOL ;  Surgeon: Shaaron Lamar HERO, MD;  [facility];  Service: Endoscopy;  Laterality: N/A;   CYSTOSCOPY W/ RETROGRADES Bilateral 08/19/2013   Procedure: CYSTOSCOPY WITH RETROGRADE PYELOGRAM;  Surgeon: Toribio Neysa Repine, MD;  [facility];  Service: Urology;  Laterality: Bilateral;   CYSTOSCOPY W/ RETROGRADES Bilateral 07/18/2016   Procedure: CYSTOSCOPY WITH RETROGRADE PYELOGRAM;  Surgeon: Ricardo Likens, MD;  Location[facility];  Service: Urology;  Laterality: Bilateral;   ENDOSCOPIC REPAIR CSF LEAK VIA NASAL PASSAGE  2011   ESOPHAGOGASTRODUODENOSCOPY (EGD) WITH PROPOFOL  N/A 09/21/2022   Procedure: ESOPHAGOGASTRODUODENOSCOPY (EGD) WITH PROPOFOL ;  Surgeon: Shaaron Lamar HERO, MD;  [facility];  Service: Endoscopy;  Laterality: N/A;   ESOPHAGOGASTRODUODENOSCOPY (EGD) WITH PROPOFOL  N/A 08/09/2023   Procedure: ESOPHAGOGASTRODUODENOSCOPY (EGD) WITH PROPOFOL ;  Surgeon: Shaaron Lamar HERO, MD;  [facility];  Service: Endoscopy;  Laterality: N/A;   HEMOSTASIS CLIP PLACEMENT  09/21/2022   Procedure: HEMOSTASIS CLIP PLACEMENT;  Surgeon: Shaaron Lamar HERO, MD;  [facility];  Service: Endoscopy;;   HOT HEMOSTASIS  08/09/2023   Procedure: HOT HEMOSTASIS (ARGON PLASMA COAGULATION/BICAP);  Surgeon: Shaaron Lamar HERO, MD;  [facility];  Service: Endoscopy;;   INTRAOPERATIVE ARTERIOGRAM  CATH LAB  01-05-2009  DR LADONA   RICA   ACUTE ANGLE 80-85% STENOSIS   KNEE ARTHROSCOPY Left 07/15/2017   Procedure: ARTHROSCOPY LEFT  KNEE AND DEBRIDEMENT, medial meniscal tear and chondromalaita;  Surgeon: Ernie Cough, MD;  Location: [facility];  Service: Orthopedics;  Laterality: Left;  60 MINS   LUMBAR FUSION  03-26-2012   L3 --  L5   POLYPECTOMY  09/21/2022   Procedure: POLYPECTOMY;  Surgeon: Shaaron Lamar HERO, MD;  [facility];  Service: Endoscopy;;   RADIOACTIVE PROSTATE  SEED IMPLANTS  08-19-2003   SEPTOPLASTY  1998   TOTAL KNEE ARTHROPLASTY Left 02/25/2018   Procedure: LEFT TOTAL KNEE ARTHROPLASTY;  Surgeon: Ernie Cough, MD;  Location: [facility];  Service: Orthopedics;  Laterality: Left;  70 mins   TRANSCAROTID ARTERY REVASCULARIZATION  Right 05/11/2022   Procedure: Right Transcarotid Artery Revascularization;  Surgeon: Sheree Penne Bruckner, MD;  [facility];  Service: Vascular;  Laterality: Right;   TRANSCAROTID ARTERY REVASCULARIZATION  Left 07/19/2023   Procedure: LEFT TRANSCAROTID ARTERY REVASCULARIZATION;  Surgeon: Sheree Penne Bruckner, MD;  Location: [facility];  Service: Vascular;  Laterality: Left;   TRANSURETHRAL RESECTION OF BLADDER TUMOR N/A 08/19/2013   Procedure: TRANSURETHRAL RESECTION OF BLADDER TUMOR (TURBT);  Surgeon: Toribio Neysa Repine, MD;  Location: [facility];  Service: Urology;  Laterality: N/A;   TRANSURETHRAL RESECTION OF BLADDER TUMOR N/A 07/18/2016   Procedure: TRANSURETHRAL RESECTION OF BLADDER TUMOR (TURBT);  Surgeon: Ricardo Likens, MD;  Location: [facility];  Service: Urology;  Laterality: N/A;   ULTRASOUND GUIDANCE FOR VASCULAR ACCESS Right 07/19/2023   Procedure: ULTRASOUND GUIDANCE FOR VASCULAR ACCESS, RIGHT FEMORAL VEIN;  Surgeon: Sheree Penne Bruckner, MD;  Location[facility] OR;  Service: Vascular;  Laterality: Right;   YAG LASER APPLICATION  07/29/2012   Procedure: YAG LASER APPLICATION;  Surgeon: Oneil T. Roz, MD;  Location: [facility];  Service: Ophthalmology;  Laterality: Right;    Past Family History   Family History  Problem Relation Age of Onset   Breast cancer Mother        mets   Diabetes Mother    Heart  disease Father        MI   Colon cancer Brother    Cancer Sister        male organs   Diabetes Sister        family hx   Arthritis Other        entire family   Diabetes Brother        x 2    Past Social History   Social History   Socioeconomic History    Marital status: Widowed    Spouse name: Not on file   Number of children: 2   Years of education: Not on file   Highest education level: Not on file  Occupational History   Occupation: retired//disabled  Tobacco Use   Smoking status: Every Day    Current packs/day: 1.00    Average packs/day: 1 pack/day for 47.0 years (47.0 ttl pk-yrs)    Types: Cigarettes   Smokeless tobacco: Never   Tobacco comments:    pt quits smoking periodically :: started smoking again Jan 2018 1ppd  Vaping Use   Vaping status: Never Used  Substance and Sexual Activity   Alcohol use: No    Alcohol/week: 0.0 standard drinks of alcohol   Drug use: No   Sexual activity: Not Currently  Other Topics Concern   Not on file  Social History Narrative   Not on file   Social Drivers of Health   Financial Resource Strain: Not on file  Food Insecurity: No Food Insecurity (08/09/2023)   Hunger Vital Sign    Worried About Running Out of Food in the Last Year: Never true    Ran Out of Food in the Last Year: Never true  Transportation Needs: No Transportation Needs (08/09/2023)   PRAPARE - Administrator, Civil Service (Medical): No    Lack of Transportation (Non-Medical): No  Physical Activity: Not on file  Stress: Not on file  Social Connections: Not on file  Intimate Partner Violence: Not At Risk (08/09/2023)   Humiliation, Afraid, Rape, and Kick questionnaire    Fear of Current or Ex-Partner: No    Emotionally Abused: No    Physically Abused: No    Sexually Abused: No    Review of Systems   General: Negative for anorexia, weight loss, fever, chills, fatigue, weakness. ENT: Negative for hoarseness, difficulty swallowing , nasal congestion. CV: Negative for chest pain, angina, palpitations, dyspnea on exertion, peripheral edema.  Respiratory: Negative for dyspnea at rest, dyspnea on exertion, cough, sputum, wheezing.  GI: See history of present illness. GU:  Negative for dysuria, hematuria,  urinary incontinence, urinary frequency, nocturnal urination.  Endo: Negative for unusual weight change.     Physical Exam   BP (!) 169/72   Pulse 76   Temp 98.4 F (36.9 C) (Oral)   Ht 5' 7 (1.702 m)   Wt 179 lb 12.8 oz (81.6 kg)   SpO2 94%   BMI 28.16 kg/m    General: Well-nourished, well-developed in no acute distress.  Eyes: No icterus. Mouth: Oropharyngeal mucosa moist and pink   Abdomen: Bowel sounds are normal, nontender, nondistended, no hepatosplenomegaly or masses,  no abdominal bruits or hernia , no rebound or guarding.  Rectal: not performed Extremities: No lower extremity edema. No clubbing or deformities. Neuro: Alert and oriented x 4   Skin: Warm and dry, no jaundice.   Psych: Alert and cooperative, normal mood and affect.  Labs   See above Imaging Studies   ECHOCARDIOGRAM COMPLETE  Result Date: 08/17/2024    ECHOCARDIOGRAM REPORT   Patient Name:   [name] Date of Exam: 08/17/2024 Medical Rec #:  [id number]      Height:       67.0 in Accession #:    [id number]     Weight:       180.3 lb Date of Birth:  Apr 11, 1946       BSA:          1.935 m Patient Age:    78 years       BP:           136/70 mmHg Patient Gender: M              HR:           82 bpm. Exam Location:  [place] Procedure: 2D Echo, Cardiac Doppler and Color Doppler (Both Spectral and Color            Flow Doppler were utilized during procedure). Indications:    I35.0 Nonrheumatic aortic (valve) stenosis  History:        Patient has prior history of Echocardiogram examinations, most                 recent 09/10/2023. CAD, Carotid Disease, history of bladder                 cancer. and PAD, Aortic Valve Disease; Risk Factors:Current                 Smoker, Sleep Apnea, Hypertension, Diabetes and Dyslipidemia.  Sonographer:    Bascom Burows RCS, RVS Referring Phys: [id number] DORN FALCON BRANCH IMPRESSIONS  1. Left ventricular ejection fraction, by estimation, is 65 to 70%. The left ventricle has normal function.  The left ventricle has no regional wall motion abnormalities. There is mild left ventricular hypertrophy. Left ventricular diastolic parameters are consistent with Grade I diastolic dysfunction (impaired relaxation). Elevated left atrial pressure.  2. Right ventricular systolic function is normal. The right ventricular size is normal. There is moderately elevated pulmonary artery systolic pressure.  3. The mitral valve is normal in structure. No evidence of mitral valve regurgitation. No evidence of mitral stenosis.  4. The tricuspid valve is abnormal.  5. The aortic valve is tricuspid. There is moderate calcification of the aortic valve. There is moderate thickening of the aortic valve. Aortic valve regurgitation is not visualized. Severe aortic valve stenosis. Aortic valve area, by VTI measures 0.91 cm. Aortic valve mean gradient measures 41.0 mmHg.  6. Aortic dilatation noted. There is mild dilatation of the ascending aorta, measuring 37 mm.  7. The inferior vena cava is normal in size with greater than 50% respiratory variability, suggesting right atrial pressure of 3 mmHg. Comparison(s): A prior study was performed on 09/10/2023. EF 60-65%. Moderate LVH. Severe calcification of the aortic valve. Moderate AS with mean pg 34.0 mmHg. AoVmax is 3.65 m/s. FINDINGS  Left Ventricle: Left ventricular ejection fraction, by estimation, is 65 to 70%. The left ventricle has normal function. The left ventricle has no regional wall motion abnormalities. The left ventricular internal cavity size was normal in size. There is  mild left ventricular hypertrophy. Left ventricular diastolic parameters are consistent with Grade I diastolic dysfunction (impaired relaxation). Elevated left atrial pressure. Right Ventricle: The right ventricular size is normal. Right vetricular wall thickness was not well visualized. Right ventricular systolic function is normal. There is moderately elevated pulmonary artery systolic pressure. The  tricuspid regurgitant velocity is 3.53 m/s,  and with an assumed right atrial pressure of 3 mmHg, the estimated right ventricular systolic pressure is 52.8 mmHg. Left Atrium: Left atrial size was normal in size. Right Atrium: Right atrial size was normal in size. Pericardium: There is no evidence of pericardial effusion. Mitral Valve: The mitral valve is normal in structure. There is mild thickening of the mitral valve leaflet(s). There is mild calcification of the mitral valve leaflet(s). Mild mitral annular calcification. No evidence of mitral valve regurgitation. No evidence of mitral valve stenosis. MV peak gradient, 9.6 mmHg. The mean mitral valve gradient is 4.0 mmHg. Tricuspid Valve: The tricuspid valve is abnormal. Tricuspid valve regurgitation is mild . No evidence of tricuspid stenosis. Aortic Valve: The aortic valve is tricuspid. There is moderate calcification of the aortic valve. There is moderate thickening of the aortic valve. There is moderate aortic valve annular calcification. Aortic valve regurgitation is not visualized. Severe  aortic stenosis is present. Aortic valve mean gradient measures 41.0 mmHg. Aortic valve peak gradient measures 65.3 mmHg. Aortic valve area, by VTI measures 0.91 cm. Pulmonic Valve: The pulmonic valve was not well visualized. Pulmonic valve regurgitation is not visualized. No evidence of pulmonic stenosis. Aorta: The aortic root is normal in size and structure and aortic dilatation noted. There is mild dilatation of the ascending aorta, measuring 37 mm. Venous: The inferior vena cava is normal in size with greater than 50% respiratory variability, suggesting right atrial pressure of 3 mmHg. IAS/Shunts: No atrial level shunt detected by color flow Doppler.  LEFT VENTRICLE PLAX 2D LVIDd:         4.60 cm     Diastology LVIDs:         3.30 cm     LV e' medial:    5.22 cm/s LV PW:         1.20 cm     LV E/e' medial:  16.6 LV IVS:        1.20 cm     LV e' lateral:   6.42 cm/s  LVOT diam:     2.10 cm     LV E/e' lateral: 13.5 LV SV:         83 LV SV Index:   43 LVOT Area:     3.46 cm  LV Volumes (MOD) LV vol d, MOD A2C: 62.2 ml LV vol d, MOD A4C: 83.3 ml LV vol s, MOD A2C: 14.3 ml LV vol s, MOD A4C: 27.0 ml LV SV MOD A2C:     47.9 ml LV SV MOD A4C:     83.3 ml LV SV MOD BP:      52.8 ml RIGHT VENTRICLE             IVC RV Basal diam:  4.30 cm     IVC diam: 1.70 cm RV Mid diam:    2.80 cm RV S prime:     14.30 cm/s  PULMONARY VEINS TAPSE (M-mode): 2.6 cm      Diastolic Velocity: 35.50 cm/s                             S/D Velocity:       2.60                             Systolic Velocity:  94.00 cm/s LEFT ATRIUM             Index  RIGHT ATRIUM           Index LA diam:        3.30 cm 1.71 cm/m   RA Area:     19.50 cm LA Vol (A2C):   69.0 ml 35.66 ml/m  RA Volume:   55.80 ml  28.84 ml/m LA Vol (A4C):   31.2 ml 16.12 ml/m LA Biplane Vol: 48.2 ml 24.91 ml/m  AORTIC VALVE                     PULMONIC VALVE AV Area (Vmax):    0.99 cm      PV Vmax:       1.23 m/s AV Area (Vmean):   0.99 cm      PV Peak grad:  6.0 mmHg AV Area (VTI):     0.91 cm AV Vmax:           404.00 cm/s AV Vmean:          300.000 cm/s AV VTI:            0.922 m AV Peak Grad:      65.3 mmHg AV Mean Grad:      41.0 mmHg LVOT Vmax:         115.00 cm/s LVOT Vmean:        85.800 cm/s LVOT VTI:          0.241 m LVOT/AV VTI ratio: 0.26  AORTA Ao Root diam: 3.40 cm Ao Asc diam:  3.70 cm MITRAL VALVE                TRICUSPID VALVE MV Area (PHT): 2.22 cm     TR Peak grad:   49.8 mmHg MV Area VTI:   1.53 cm     TR Vmax:        353.00 cm/s MV Peak grad:  9.6 mmHg MV Mean grad:  4.0 mmHg     SHUNTS MV Vmax:       1.55 m/s     Systemic VTI:  0.24 m MV Vmean:      89.1 cm/s    Systemic Diam: 2.10 cm MV Decel Time: 342 msec MV E velocity: 86.40 cm/s MV A velocity: 142.00 cm/s MV E/A ratio:  0.61 Dorn Ross MD Electronically signed by Dorn Ross MD Signature Date/Time: 08/17/2024/11:03:43 AM    Final      Assessment/Plan:    Assessment & Plan Iron  deficiency anemia due to chronic gastrointestinal blood loss from gastrointestinal angiodysplasia Chronic iron  deficiency anemia likely due to gastrointestinal angiodysplasia. Previous upper endoscopy revealed duodenal angiodysplasias, treated with APC. He denies overt GI bleeding or other bleeding. He reports taking ferrous sulfate , Fusion Plus and ACCRUFer (started 06/2024). Has received recent IV iron  08/2024).   -will reach out to hematology and determine plan to simiply oral iron .  -continue to follow with hematology for close monitoring of IDA -plan for capsule endoscopy  -continue to avoid all NSAIDs -continue ASA 81mg  daily -continue PPI.   Constipation: continue miralax , doing well     Sonny RAMAN. Ezzard, MHS, PA-C [facility]

## 2024-08-24 NOTE — Telephone Encounter (Signed)
 L/m for pt to return call

## 2024-08-24 NOTE — Telephone Encounter (Signed)
 Patient has IDA, requiring increased iron  needs, history of duodenal angiodysplasia.   Please schedule for small bowel capsule study. Patient aware and agreeable. Hold iron  per protocol (he is currently on 3 different iron  supplements and needs to hold all).

## 2024-08-24 NOTE — Telephone Encounter (Addendum)
 Per [employer] no PA requiredPRECERT IS NOT REQUIRED OR ONLY REQUIRED IN UNIQUE CIRCUMSTANCES FOR MEDICAID REFER TO PRIOR AUTH TOOL ON [email or web address] FOR ALL OTHERS REFER TO CODE SEARCH TOOL ON [email or web address] COVERAGE OF SERVICES ARE SUBJECT TO BENEFITS AND ELIGIBILITY  LMTCB for pt to schedule

## 2024-08-24 NOTE — Telephone Encounter (Signed)
 [name],   Patient seen today. He is taking 3 separate oral irons. He is being prescribed two by PCP and one is otc. He states he was not aware that he should be on only one.   I spoke to Delon Hope, NP with hematology who is managing his IDA. She recommended having the patient take ONLY the Fusion Plus (unless patient tells us  that it is unaffordable or other reason then we can consider using one of the others).   Will you please advise patient to take Fusion Plus but STOP ferrous sulfate  and ACCRUFeR? Let him know his hematologist has made these recommendations today after I spoke with her.   Can we let his PCP know as well?

## 2024-08-24 NOTE — Patient Instructions (Signed)
 I will reach out to hematology regarding the three oral iron  supplements that you are on and see if we can simplify your regimen.  We will work to get you scheduled for small bowel capsule test in the near future.

## 2024-08-25 ENCOUNTER — Encounter: Payer: Self-pay | Admitting: *Deleted

## 2024-08-25 NOTE — Telephone Encounter (Signed)
 noted

## 2024-08-25 NOTE — Telephone Encounter (Signed)
 Pt has been scheduled for 09/02/24. Instructions sent via email. Pt was instructed to start holding all iron  medications starting tomorrow.  He has been added to your schedule for 09/08/24 for givens reading.

## 2024-08-26 NOTE — Telephone Encounter (Signed)
 Pt was made aware and verbalized understanding.

## 2024-09-02 ENCOUNTER — Encounter (HOSPITAL_COMMUNITY): Admission: RE | Disposition: A | Payer: Self-pay | Attending: Internal Medicine

## 2024-09-02 ENCOUNTER — Ambulatory Visit (HOSPITAL_COMMUNITY)
Admission: RE | Admit: 2024-09-02 | Discharge: 2024-09-02 | Disposition: A | Discharge status: Home / Self Care | Attending: Internal Medicine | Admitting: Internal Medicine

## 2024-09-02 ENCOUNTER — Encounter (HOSPITAL_COMMUNITY): Payer: Self-pay | Admitting: Internal Medicine

## 2024-09-02 DIAGNOSIS — D649 Anemia, unspecified: Secondary | ICD-10-CM | POA: Insufficient documentation

## 2024-09-02 DIAGNOSIS — D509 Iron deficiency anemia, unspecified: Secondary | ICD-10-CM | POA: Diagnosis not present

## 2024-09-02 DIAGNOSIS — K5520 Angiodysplasia of colon without hemorrhage: Secondary | ICD-10-CM | POA: Diagnosis not present

## 2024-09-02 DIAGNOSIS — Z860100 Personal history of colon polyps, unspecified: Secondary | ICD-10-CM | POA: Insufficient documentation

## 2024-09-02 HISTORY — PX: GIVENS CAPSULE STUDY: SHX5432

## 2024-09-02 SURGERY — IMAGING PROCEDURE, GI TRACT, INTRALUMINAL, VIA CAPSULE

## 2024-09-03 ENCOUNTER — Encounter (HOSPITAL_COMMUNITY): Payer: Self-pay | Admitting: Internal Medicine

## 2024-09-14 ENCOUNTER — Encounter: Payer: Self-pay | Admitting: *Deleted

## 2024-09-18 ENCOUNTER — Telehealth: Payer: Self-pay | Admitting: Gastroenterology

## 2024-09-18 NOTE — Op Note (Addendum)
" °  Small Bowel [name] Capsule Study Procedure date:  09/02/2024   Referring Provider:  Sonny Miles. Ezzard RIGGERS PCP:  Dr. Shona, Norleen PEDLAR, MD  Indication for procedure:  IDA. EGD 07/2023 with two duodenal angioectasias ablated. Colonoscopy 09/2022 with one 10mm polyp removed from the cecum.    Patient data:  Wt: 81.6kg Ht: 5'7  Findings:  Small bowel study complete, with capsule reaching colon. No active bleeding noted. No small bowel masses. Single non-bleeding angioectasia noted at 02:06:33 at 77% small bowel progression.   First Gastric image:  59 sec First Duodenal image: 1 hr 18 min 24 sec First Ileo-Cecal Valve image: 3 hr 37 min 04 sec First Cecal image: 3 hr 39 min 08 sec Gastric Passage time: 1 hr 17 min Small Bowel Passage time:  2 hr 20 min  Summary & Recommendations: No active bleeding noted on study. No small bowel masses or ulcerations. A single angioectasia noted in the more distal small bowel. Pertinent images reviewed with Dr. Shaaron.  Continue to follow with hematology for anemia. If he has progressive fatigue, he should notify hematology or us  so we can update labs sooner. Currently scheduled for labs in March 2026.  Continue to avoid NSAIDs.  Return office visit in April 2026. If continues to require IV iron , would consider updating colonoscopy+/-EGD.      [name]. Ezzard RIGGERS [facility] [id number] 1/2/202611:00 AM        "

## 2024-09-18 NOTE — Telephone Encounter (Signed)
Lmom for return call.  

## 2024-09-18 NOTE — Telephone Encounter (Signed)
 Please let patient know his small bowel capsule results.  Please make ov with me in April 2026.    No active bleeding noted on study. No small bowel masses or ulcerations. A single angioectasia noted in the more distal small bowel. Pertinent images reviewed with Dr. Shaaron.   Continue to follow with hematology for anemia. If he has progressive fatigue, he should notify hematology or us  so we can update labs sooner. Currently scheduled for labs in March 2026.  Continue to avoid NSAIDs.  Return office visit in April 2026. If continues to require IV iron , would consider updating colonoscopy+/-EGD.

## 2024-09-21 NOTE — Telephone Encounter (Signed)
Lmom for return call.  

## 2024-09-21 NOTE — Telephone Encounter (Signed)
 Pt was made aware and verbalized understanding.

## 2024-09-24 ENCOUNTER — Ambulatory Visit: Attending: Nurse Practitioner | Admitting: Nurse Practitioner

## 2024-09-24 ENCOUNTER — Encounter: Payer: Self-pay | Admitting: Nurse Practitioner

## 2024-09-24 VITALS — BP 158/82 | HR 78 | Ht 67.0 in | Wt 180.2 lb

## 2024-09-24 DIAGNOSIS — E785 Hyperlipidemia, unspecified: Secondary | ICD-10-CM | POA: Diagnosis not present

## 2024-09-24 DIAGNOSIS — I10 Essential (primary) hypertension: Secondary | ICD-10-CM | POA: Diagnosis not present

## 2024-09-24 DIAGNOSIS — I779 Disorder of arteries and arterioles, unspecified: Secondary | ICD-10-CM

## 2024-09-24 DIAGNOSIS — I350 Nonrheumatic aortic (valve) stenosis: Secondary | ICD-10-CM | POA: Diagnosis not present

## 2024-09-24 DIAGNOSIS — I2510 Atherosclerotic heart disease of native coronary artery without angina pectoris: Secondary | ICD-10-CM | POA: Diagnosis not present

## 2024-09-24 NOTE — Patient Instructions (Addendum)
 Medication Instructions:   Please call the office back to confirm dosing on your Labetalol   Continue all other medications.     Labwork:  none  Testing/Procedures:  none  Follow-Up:  3 months   Any Other Special Instructions Will Be Listed Below (If Applicable).  You have been referred to:  Structural Heart   If you need a refill on your cardiac medications before your next appointment, please call your pharmacy.

## 2024-09-24 NOTE — Progress Notes (Unsigned)
 " Cardiology Office Note   Date:  09/24/2024 ID:  [name], DOB [birth year], MRN [id number] PCP: [name], MD  [facility] Providers Cardiologist:  Alvan Carrier, MD     History of Present Illness [name] is a 79 y.o. male with a PMH of non rheumatic aortic valve stenosis, CAD, HTN, carotid artery disease, s/p carotid interventions (s/p RICA stenting in 2023 and left TCAR in 07/2023), HLD, OSA, GI bleed, and chronic back pain, who presents today for 6 month follow-up. Followed by [facility].   Last seen by Dr. Alvan on 05/07/2024. Was overall doing well at the time. His Echo was updated and revealed normal LVEF, with progression of his AS in the severe range, appt was recommended to be moved closer to discuss results further.   He is here for follow-up. He states he has episodes of occasionally feeling tired and worn out.  Tells me he is being followed by hematologist and still gets blood transfusions. Overall doing fairly well. Denies any chest pain, shortness of breath, palpitations, syncope, presyncope, dizziness, orthopnea, PND, swelling or significant weight changes, acute bleeding, or claudication.  ROS: Negative. See HPI.   SH: Retired from patent examiner  Studies Reviewed  EKG:  EKG Interpretation Date/Time:  Thursday September 24 2024 11:11:43 EST Ventricular Rate:  73 PR Interval:  192 QRS Duration:  86 QT Interval:  356 QTC Calculation: 392 R Axis:   30  Text Interpretation: Normal sinus rhythm ST & T wave abnormality, consider inferior ischemia When compared with ECG of 09-Aug-2023 10:20, PREVIOUS ECG IS PRESENT Confirmed by Miriam Norris [phone]) on 09/24/2024 11:18:45 AM   Echo 08/2024:  1. Left ventricular ejection fraction, by estimation, is 65 to 70%. The  left ventricle has normal function. The left ventricle has no regional  wall motion abnormalities. There is mild left ventricular hypertrophy.  Left ventricular diastolic parameters  are  consistent with Grade I diastolic dysfunction (impaired relaxation).  Elevated left atrial pressure.   2. Right ventricular systolic function is normal. The right ventricular  size is normal. There is moderately elevated pulmonary artery systolic  pressure.   3. The mitral valve is normal in structure. No evidence of mitral valve  regurgitation. No evidence of mitral stenosis.   4. The tricuspid valve is abnormal.   5. The aortic valve is tricuspid. There is moderate calcification of the  aortic valve. There is moderate thickening of the aortic valve. Aortic  valve regurgitation is not visualized. Severe aortic valve stenosis.  Aortic valve area, by VTI measures 0.91  cm. Aortic valve mean gradient measures 41.0 mmHg.   6. Aortic dilatation noted. There is mild dilatation of the ascending  aorta, measuring 37 mm.   7. The inferior vena cava is normal in size with greater than 50%  respiratory variability, suggesting right atrial pressure of 3 mmHg.   Comparison(s): A prior study was performed on 09/10/2023. EF 60-65%.  Moderate LVH. Severe calcification of the aortic valve. Moderate AS with  mean pg 34.0 mmHg. AoVmax is 3.65 m/s.  Carotid duplex 06/2024:  Summary:  Right Carotid: Patent stent with velocities suggestive of a <50% stenosis  (top end of range).   Left Carotid: Patent stent with velocities suggestive of a 50-75% stenosis  (low end of range).   Vertebrals:  Bilateral vertebral arteries demonstrate antegrade flow.  Subclavians: Normal flow hemodynamics were seen in bilateral subclavian arteries.   *See table(s) above for measurements and observations.  Lexiscan   05/2017:  No diagnostic ST segment changes indicating ischemia. No arrhythmias. Moderate-sized, mild intensity, inferior and inferoseptal defects that are largely fixed with a partial region of reversibility in the mid inferoseptal zone. Normal wall motion in this area argues against scar. Although diaphragmatic  attenuation is likely playing a role, consider a small region of inferoseptal ischemia. This is a low risk study. Nuclear stress EF: 64%.     Physical Exam VS:  BP (!) 141/80   Pulse 78   Ht 5' 7 (1.702 m)   Wt 180 lb 3.2 oz (81.7 kg)   SpO2 96%   BMI 28.22 kg/m        Wt Readings from Last 3 Encounters:  09/24/24 180 lb 3.2 oz (81.7 kg)  08/24/24 179 lb 12.8 oz (81.6 kg)  08/18/24 175 lb (79.4 kg)    GEN: Well nourished, well developed in no acute distress NECK: No JVD; No carotid bruits CARDIAC: S1/S2, RRR, no murmurs, rubs, gallops RESPIRATORY:  Clear to auscultation without rales, wheezing or rhonchi  ABDOMEN: Soft, non-tender, non-distended EXTREMITIES:  No edema; No deformity   ASSESSMENT AND PLAN  Severe aortic valve stenosis Most recent echo from December 2025 showed severe aortic valve stenosis with mean gradient measuring 41 mmHg.  Does show some nonspecific symptoms, did discuss possible TAVR.  Did discuss options for management and came to the shared medical decision that we will place referral to structural heart for further evaluation. Care and ED precautions discussed.   CAD Stable with no anginal symptoms. No indication for ischemic evaluation.  Continue aspirin , labetalol , losartan , and rosuvastatin .  He is unsure if he is taking labetalol  once or twice per day and will have him verify this prescription when he gets home. Heart healthy diet and regular cardiovascular exercise encouraged.   HTN Blood pressure on recheck is relatively stable and at goal. Discussed to monitor BP at home at least 2 hours after medications and sitting for 5-10 minutes.  He will verify his labetalol  dosage with us  when he gets home. No med changes at this time. Heart healthy diet and regular cardiovascular exercise encouraged.   Carotid artery disease Most recent carotid duplex from October 2025 revealed patent right carotid artery stent with velocities suggestive of less than 50%  stenosis and patent stent to left carotid artery with velocities suggestive of 50 to 75% stenosis.  Closely followed by vascular surgery.  No med changes at this time.  Hyperlipidemia  LDL from last October 2025 was 34.  He is at goal.  Continue current medication regimen. Heart healthy diet and regular cardiovascular exercise encouraged.    Dispo: Follow-up with MD/APP in 3 months or sooner if anything changes.  Signed, Almarie Crate, NP   "

## 2024-09-25 ENCOUNTER — Telehealth: Payer: Self-pay | Admitting: Cardiology

## 2024-09-25 NOTE — Telephone Encounter (Signed)
 Pt c/o medication issue:  1. Name of Medication: labetalol  (NORMODYNE ) 100 MG tablet   2. How are you currently taking this medication (dosage and times per day)?    3. Are you having a reaction (difficulty breathing--STAT)? no  4. What is your medication issue? Patient called back to to say that he only takes one capsule of the medication. Please advise

## 2024-09-25 NOTE — Telephone Encounter (Signed)
 Pt states that he takes one Labetalol  100 mg every morning. Please advise.

## 2024-09-29 MED ORDER — METOPROLOL TARTRATE 25 MG PO TABS: 25.0000 mg | ORAL_TABLET | Freq: Two times a day (BID) | ORAL | 0 refills | Status: AC

## 2024-09-29 NOTE — H&P (View-Only) (Signed)
 "  Patient ID: [name] MRN: [id number] DOB/AGE: 1946/09/03 79 y.o.  Primary Care Physician:Hall, Norleen PEDLAR, MD Primary Cardiologist: Branch  CC:  Aortic valvular disease management     FOCUSED PROBLEM LIST:   Aortic stenosis AVA 0.91, MG 41, V-max 4.1, EF 60-65% TTE December 2025 EKG sinus rhythm without bundle-branch blocks Coronary artery calcification Chest CT October 2025 T2DM  Not on insulin   Hypertension  Hyperlipidemia Aortic atherosclerosis Chest CT 2025 CKD stage II Carotid disease status post [name] stenting 2023, left TCAR 2024 Iron  deficiency anemia Requires intermittent iron  infusions BMI 28/BSA 1.97  January 2026:  Patient consents to use of AI scribe. The patient is 79 year old male with the above listed medical problems referred for recommendations regarding his aortic valvular disease.  He was seen recently in Dr. Ranae office.  An echocardiogram demonstrated progression to severe aortic stenosis.  He reported exertional fatigue.  He is here to discuss further.  He has experienced increased fatigue over the past year, with minimal exertion leading to tiredness. Shortness of breath occurs with activities such as showering, necessitating frequent rest during daily tasks like cleaning and cooking. No chest discomfort with exertion, significant leg swelling, or orthopnea. He uses a CPAP machine for a sleep disorder.  Occasional dizziness, particularly when standing up, occurs about once a week.  He has a history of gastrointestinal bleeding, having undergone multiple iron  infusions and a recent endoscopy that did not reveal any major issues. He continues to experience some blood loss.  He has a history of tremors, which he was told are genetic, and is currently on metoprolol . He also has a history of cerebrospinal fluid leaks, which required surgical intervention twice.  He lives in a large house with family members, residing in the basement while his son  lives upstairs. He does not drive at night.  He has upper dentures that he does not use.  He has a few remaining lower teeth.  He denies any dental issues.  Pre Surgical Assessment: 5 M Walk Test  73M=16.94ft  5 Meter Walk Test- trial 1: 7.56 seconds 5 Meter Walk Test- trial 2: 6.60 seconds 5 Meter Walk Test- trial 3: 8.46 seconds 5 Meter Walk Test Average: 7.54 seconds   Past Medical History:  Diagnosis Date   Anemia    Iron  Deficiency   Bilateral renal cysts    Bladder cancer (HCC) UROLOGIST-  DR [facility]   RECURRENT 11/ 2017 --  hx turbt in 2004 and 08-19-2013   BPH without obstruction/lower urinary tract symptoms    Cervical fusion syndrome    LEFT ARM NUMBNESS--  CONTROLLED W/ GABAPENTIN    Complication of anesthesia    Coronary artery disease    CARDIOLOGIST-- DR ALVAN ESPIRIDION JACOBS)   DDD (degenerative disc disease), cervical    DDD (degenerative disc disease), lumbar    Diverticulosis of sigmoid colon    Heart murmur    slight murmur per patient   History of acute gouty arthritis    History of adenomatous polyp of colon    2001;  2012- non-malignant leiomyoma;  04-07-2014  tubualr adenoma and hyperplastic polyp's   History of prostate cancer UROLOGIST-  DR [facility]   S/P  RADIOACTIVE SEED IMPLANTS 2004   HOH (hard of hearing)    no hearing aids   Hyperlipidemia    Hypertension    OA (osteoarthritis)    Occlusion and stenosis of carotid artery without mention of cerebral infarction CARDIOLOGIST  -- DR  DORN ALVAN  LAST DUPLEX  11-23-2015  RICA  (DUPLEX DOPPLER 01-20-2016 RICA 60-79% PROXIMAL) /   LICA <50% (HAD CONSULT W/ DR LAURENCE , VASCULAR SURGEON 01-20-2016)   OSA on CPAP    BiPAP   Peripheral vascular disease    Carotid Artery Stenosis   PONV (postoperative nausea and vomiting)    Prostate cancer (HCC)    per patient years ago- had radiation implant   Spondylosis, cervical    Type 2 diabetes mellitus (HCC)    Wears glasses     Past Surgical  History:  Procedure Laterality Date   ANTERIOR REMOVAL CAGE AND PLATE R6-R2/ CORPECTOMY C7 (FX)/  ALLOGRAFT AND FUSION C3 -- T1/  POSTERIOR DECOMPRESSION BILATERAL LAMINECTOMY C4 -- 6 & PARTIAL C3/  POSTEROLATERAL ARTHRODESIS C3 - T1  06/05/2005   APPENDECTOMY  1990'S   BIOPSY  09/21/2022   Procedure: BIOPSY;  Surgeon: Shaaron Lamar HERO, MD;  [facility];  Service: Endoscopy;;   CATARACT EXTRACTION W/ INTRAOCULAR LENS  IMPLANT, BILATERAL  2013   CERVICAL FUSION  05/11/2005   C3  --  C7/  due to post op quadriparesis same day s/p  anterior C 4,5,6, colpectomy, decompression, removal epidural hematoma, foraminnotomy, cage and plate   COLONOSCOPY  last one 04-07-2014   COLONOSCOPY WITH PROPOFOL  N/A 09/21/2022   Procedure: COLONOSCOPY WITH PROPOFOL ;  Surgeon: Shaaron Lamar HERO, MD;  [facility];  Service: Endoscopy;  Laterality: N/A;   CYSTOSCOPY W/ RETROGRADES Bilateral 08/19/2013   Procedure: CYSTOSCOPY WITH RETROGRADE PYELOGRAM;  Surgeon: Toribio Neysa Repine, MD;  Location: [facility];  Service: Urology;  Laterality: Bilateral;   CYSTOSCOPY W/ RETROGRADES Bilateral 07/18/2016   Procedure: CYSTOSCOPY WITH RETROGRADE PYELOGRAM;  Surgeon: Ricardo Likens, MD;  Location: [facility];  Service: Urology;  Laterality: Bilateral;   ENDOSCOPIC REPAIR CSF LEAK VIA NASAL PASSAGE  2011   ESOPHAGOGASTRODUODENOSCOPY (EGD) WITH PROPOFOL  N/A 09/21/2022   Procedure: ESOPHAGOGASTRODUODENOSCOPY (EGD) WITH PROPOFOL ;  Surgeon: Shaaron Lamar HERO, MD;  [facility];  Service: Endoscopy;  Laterality: N/A;   ESOPHAGOGASTRODUODENOSCOPY (EGD) WITH PROPOFOL  N/A 08/09/2023   Procedure: ESOPHAGOGASTRODUODENOSCOPY (EGD) WITH PROPOFOL ;  Surgeon: Shaaron Lamar HERO, MD;  [facility];  Service: Endoscopy;  Laterality: N/A;   GIVENS CAPSULE STUDY N/A 09/02/2024   Procedure: IMAGING PROCEDURE, GI TRACT, INTRALUMINAL, VIA CAPSULE;  Surgeon: Shaaron Lamar HERO, MD;   [facility];  Service: Endoscopy;  Laterality: N/A;  7:30 am   HEMOSTASIS CLIP PLACEMENT  09/21/2022   Procedure: HEMOSTASIS CLIP PLACEMENT;  Surgeon: Shaaron Lamar HERO, MD;  [facility];  Service: Endoscopy;;   HOT HEMOSTASIS  08/09/2023   Procedure: HOT HEMOSTASIS (ARGON PLASMA COAGULATION/BICAP);  Surgeon: Shaaron Lamar HERO, MD;  [facility];  Service: Endoscopy;;   INTRAOPERATIVE ARTERIOGRAM  CATH LAB  01-05-2009  DR LADONA   RICA   ACUTE ANGLE 80-85% STENOSIS   KNEE ARTHROSCOPY Left 07/15/2017   Procedure: ARTHROSCOPY LEFT  KNEE AND DEBRIDEMENT, medial meniscal tear and chondromalaita;  Surgeon: Ernie Cough, MD;  Location: [facility];  Service: Orthopedics;  Laterality: Left;  60 MINS   LUMBAR FUSION  03-26-2012   L3 --  L5   POLYPECTOMY  09/21/2022   Procedure: POLYPECTOMY;  Surgeon: Shaaron Lamar HERO, MD;  [facility];  Service: Endoscopy;;   RADIOACTIVE PROSTATE SEED IMPLANTS  08-19-2003   SEPTOPLASTY  1998   TOTAL KNEE ARTHROPLASTY Left 02/25/2018   Procedure: LEFT TOTAL KNEE ARTHROPLASTY;  Surgeon: Ernie Cough, MD;  Location: [facility];  Service: Orthopedics;  Laterality: Left;  70 mins   TRANSCAROTID ARTERY REVASCULARIZATION  Right 05/11/2022   Procedure: Right Transcarotid Artery Revascularization;  Surgeon: Sheree Penne Bruckner, MD;  Location: [facility];  Service: Vascular;  Laterality: Right;   TRANSCAROTID ARTERY REVASCULARIZATION  Left 07/19/2023   Procedure: LEFT TRANSCAROTID ARTERY REVASCULARIZATION;  Surgeon: Sheree Penne Bruckner, MD;  Location: [facility];  Service: Vascular;  Laterality: Left;   TRANSURETHRAL RESECTION OF BLADDER TUMOR N/A 08/19/2013   Procedure: TRANSURETHRAL RESECTION OF BLADDER TUMOR (TURBT);  Surgeon: Toribio Neysa Repine, MD;  Location: [facility];  Service: Urology;  Laterality: N/A;   TRANSURETHRAL RESECTION OF BLADDER TUMOR N/A 07/18/2016   Procedure: TRANSURETHRAL RESECTION OF BLADDER TUMOR  (TURBT);  Surgeon: Ricardo Likens, MD;  Location: [facility];  Service: Urology;  Laterality: N/A;   ULTRASOUND GUIDANCE FOR VASCULAR ACCESS Right 07/19/2023   Procedure: ULTRASOUND GUIDANCE FOR VASCULAR ACCESS, RIGHT FEMORAL VEIN;  Surgeon: Sheree Penne Bruckner, MD;  Location[facility];  Service: Vascular;  Laterality: Right;   YAG LASER APPLICATION  07/29/2012   Procedure: YAG LASER APPLICATION;  Surgeon: Oneil T. Roz, MD;  Location: [facility];  Service: Ophthalmology;  Laterality: Right;    Family History  Problem Relation Age of Onset   Breast cancer Mother        mets   Diabetes Mother    Heart disease Father        MI   Colon cancer Brother    Cancer Sister        male organs   Diabetes Sister        family hx   Arthritis Other        entire family   Diabetes Brother        x 2    Social History   Socioeconomic History   Marital status: Widowed    Spouse name: Not on file   Number of children: 2   Years of education: Not on file   Highest education level: Not on file  Occupational History   Occupation: retired//disabled  Tobacco Use   Smoking status: Every Day    Current packs/day: 1.00    Average packs/day: 1 pack/day for 47.0 years (47.0 ttl pk-yrs)    Types: Cigarettes   Smokeless tobacco: Never   Tobacco comments:    pt quits smoking periodically :: started smoking again Jan 2018 1ppd  Vaping Use   Vaping status: Never Used  Substance and Sexual Activity   Alcohol use: No    Alcohol/week: 0.0 standard drinks of alcohol   Drug use: No   Sexual activity: Not Currently  Other Topics Concern   Not on file  Social History Narrative   Not on file   Social Drivers of Health   Tobacco Use: High Risk (10/02/2024)   Patient History    Smoking Tobacco Use: Every Day    Smokeless Tobacco Use: Never    Passive Exposure: Not on file  Financial Resource Strain: Not on file  Food Insecurity: No Food Insecurity (08/09/2023)   Hunger Vital Sign     Worried About Running Out of Food in the Last Year: Never true    Ran Out of Food in the Last Year: Never true  Transportation Needs: No Transportation Needs (08/09/2023)   PRAPARE - Administrator, Civil Service (Medical): No    Lack of Transportation (Non-Medical): No  Physical Activity: Not  on file  Stress: Not on file  Social Connections: Not on file  Intimate Partner Violence: Not At Risk (08/09/2023)   Humiliation, Afraid, Rape, and Kick questionnaire    Fear of Current or Ex-Partner: No    Emotionally Abused: No    Physically Abused: No    Sexually Abused: No  Depression (PHQ2-9): Low Risk (08/18/2024)   Depression (PHQ2-9)    PHQ-2 Score: 0  Alcohol Screen: Not on file  Housing: Patient Declined (08/09/2023)   Housing    Last Housing Risk Score: 0  Utilities: Not At Risk (08/09/2023)   AHC Utilities    Threatened with loss of utilities: No  Health Literacy: Not on file     Prior to Admission medications  Medication Sig Start Date End Date Taking? Authorizing Provider  ACCRUFER 30 MG CAPS Take 1 capsule by mouth 2 (two) times daily.    [provider]  acetaminophen  (TYLENOL ) 500 MG tablet Take 1,000 mg by mouth every 6 (six) hours as needed for moderate pain.    [provider]  albuterol  (VENTOLIN  HFA) 108 (90 Base) MCG/ACT inhaler Inhale 2 puffs into the lungs every 4 (four) hours as needed for shortness of breath or wheezing. 11/22/22   [provider]  allopurinol  (ZYLOPRIM ) 300 MG tablet Take 300 mg by mouth at bedtime.    [provider]  amLODipine  (NORVASC ) 10 MG tablet TAKE 1 TABLET BY MOUTH EVERY DAY 07/24/18   Alvan Dorn FALCON, MD  aspirin  EC 81 MG tablet Take 1 tablet (81 mg total) by mouth daily with breakfast. 08/12/23   Emokpae, Courage, MD  Budeson-Glycopyrrol-Formoterol (BREZTRI AEROSPHERE) 160-9-4.8 MCG/ACT AERO Inhale 1 puff into the lungs daily.    [provider]  Cholecalciferol  (VITAMIN D3)  1.25 MG (50000 UT) CAPS Take 1 capsule by mouth once a week. 02/24/24   [provider]  colchicine  0.6 MG tablet Take 0.6 mg by mouth 2 (two) times daily as needed (gout).     [provider]  cyanocobalamin  (VITAMIN B12) 1000 MCG tablet Take 1 tablet (1,000 mcg total) by mouth daily. 09/21/22   Leotis Bogus, MD  docusate sodium  (COLACE) 100 MG capsule Take 100 mg by mouth at bedtime.    [provider]  ferrous sulfate  325 (65 FE) MG EC tablet Take 325 mg by mouth daily.    [provider]  finasteride  (PROSCAR ) 5 MG tablet Take 5 mg by mouth at bedtime.    [provider]  furosemide  (LASIX ) 40 MG tablet Take 40 mg by mouth every morning.    [provider]  gabapentin  (NEURONTIN ) 300 MG capsule Take 300 mg by mouth 2 (two) times daily.    [provider]  Iron -FA-B Cmp-C-Biot-Probiotic (FUSION PLUS) CAPS Take 1 capsule by mouth daily. 12/19/22   [provider]  labetalol  (NORMODYNE ) 100 MG tablet Take 100 mg by mouth at bedtime.    [provider]  losartan  (COZAAR ) 100 MG tablet Take 100 mg by mouth daily. 07/22/24   [provider]  metFORMIN  (GLUCOPHAGE -XR) 500 MG 24 hr tablet TAKE ONE (1) TABLET BY MOUTH TWICE DAILY    [provider]  NON FORMULARY Pt uses cpap    [provider]  Omega-3-6-9 CAPS Take 1 capsule by mouth 2 (two) times daily.    [provider]  pantoprazole  (PROTONIX ) 40 MG tablet TAKE 1 TABLET BY MOUTH DAILY BEFORE BREAKFAST 07/02/24   Ezzard Sonny RAMAN, PA-C  polyethylene glycol powder (  GLYCOLAX /MIRALAX ) powder TAKE 17MG (1CAPFUL) BY MOUTH(MIXED WITH WATER HILLIARD) DAILY 05/02/15   Obie Princella HERO, MD  rosuvastatin  (CRESTOR ) 10 MG tablet Take 10 mg by mouth every morning.     [provider]  tamsulosin  (FLOMAX ) 0.4 MG CAPS capsule Take 0.4 mg by mouth daily. 09/05/20   [provider]    Allergies[1]  REVIEW OF SYSTEMS:  General: no  fevers/chills/night sweats Eyes: no blurry vision, diplopia, or amaurosis ENT: no sore throat or hearing loss Resp: no cough, wheezing, or hemoptysis CV: no edema or palpitations GI: no abdominal pain, nausea, vomiting, diarrhea, or constipation GU: no dysuria, frequency, or hematuria Skin: no rash Neuro: no headache, numbness, tingling, or weakness of extremities Musculoskeletal: no joint pain or swelling Heme: no bleeding, DVT, or easy bruising Endo: no polydipsia or polyuria  BP (!) 118/56   Pulse 77   Resp 16   Ht 5' 7 (1.702 m)   Wt 184 lb 3.2 oz (83.6 kg)   SpO2 97%   BMI 28.85 kg/m   PHYSICAL EXAM: GEN:  AO x 3 in no acute distress HEENT: Normal Dentition: Poor Neck: JVP normal. +2 carotid upstrokes without bruits. No thyromegaly. Lungs: equal expansion, clear bilaterally CV: Apex is discrete and nondisplaced, RRR with 3/6 SEM Abd: soft, non-tender, non-distended; no bruit; positive bowel sounds Ext: no edema, ecchymoses, or cyanosis Vascular: 2+ femoral pulses, 2+ radial pulses       Skin: warm and dry without rash Neuro: CN II-XII grossly intact; motor and sensory grossly intact    DATA AND STUDIES:  EKG: January 2026 sinus rhythm with nonspecific ST and T wave changes no bundle-branch blocks  EKG Interpretation Date/Time:    Ventricular Rate:    PR Interval:    QRS Duration:    QT Interval:    QTC Calculation:   R Axis:      Text Interpretation:          CARDIAC STUDIES: Refer to CV Procedures and Imaging Tabs  08/03/2024: Hemoglobin 10.8; Platelets 205   STS RISK CALCULATOR: Pending  NYHA CLASS: 2    ASSESSMENT AND PLAN:   1. Nonrheumatic aortic valve stenosis   2. Coronary artery calcification seen on CAT scan   3. Type 2 diabetes mellitus without complication, without long-term current use of insulin  (HCC)   4. Hypertension associated with diabetes (HCC)   5. Hyperlipidemia associated with type 2 diabetes mellitus (HCC)   6.  Aortic atherosclerosis   7. CKD stage 2 due to type 2 diabetes mellitus (HCC)   8. H/O transcarotid artery revascularization (TCAR)   9. Presence of internal carotid stent   10. Iron  deficiency anemia, unspecified iron  deficiency anemia type     Aortic stenosis: I reviewed the patient's echocardiogram which demonstrates severe aortic stenosis heavily calcified valve and a mean gradient above 40 mmHg.  He has developed NYHA class II symptoms of dyspnea.  His last blood counts seem to be stable so I do not think his anemia can explain his symptoms.  I believe it is likely due to his aortic valvular disease.  We have decided to pursue an evaluation for potential aortic valve intervention.  Will refer for coronary angiography, right heart catheterization, CTA, and cardiothoracic surgical appointment. Coronary artery calcification: Continue aspirin  81 mg, rosuvastatin  10 mg. T2DM: Continue aspirin  81 mg, rosuvastatin  10 mg, consider ARB and SGLT2 inhibitor. Hypertension: Continue amlodipine  10 mg, losartan  100 mg.  BP is well-controlled today. Hyperlipidemia: Continue rosuvastatin  10mg . Aortic atherosclerosis: Continue  aspirin  81 mg, rosuvastatin  10 mg. CKD stage II: Continue losartan  100 mg; consider SGLT2 inhibitor in the future. History of left TCAR: Continue aspirin  81 mg, rosuvastatin  10 mg. History of right carotid stenting: See #7 above. Iron  deficiency anemia: Requires intermittent iron  infusions.  Last hemoglobin in November was above 10.  Likely not the major driver of his symptoms currently.   I have personally reviewed the patients imaging data as summarized above.  I have reviewed the natural history of aortic stenosis with the patient and family members who are present today. We have discussed the limitations of medical therapy and the poor prognosis associated with symptomatic aortic stenosis. We have also reviewed potential treatment options, including palliative medical therapy,  conventional surgical aortic valve replacement, and transcatheter aortic valve replacement. We discussed treatment options in the context of this patient's specific comorbid medical conditions.   All of the patient's questions were answered today. Will make further recommendations based on the results of studies outlined above.   I spent 41 minutes reviewing all clinical data during and prior to this visit including all relevant imaging studies, laboratories, clinical information from other health systems and prior notes from both Cardiology and other specialties, interviewing the patient, conducting a complete physical examination, and coordinating care in order to formulate a comprehensive and personalized evaluation and treatment plan.   Marytza Grandpre K Aslee Such, MD  10/02/2024 9:00 AM    [facility] [address] [place], [place], [place]  [address] Phone: [phone]; Fax: [phone]        [1]  Allergies Allergen Reactions   Dilaudid  [Hydromorphone  Hcl] Other (See Comments)    acts a little off   Lyrica [Pregabalin] Nausea Only and Other (See Comments)    Make me feel bad   Toradol  [Ketorolac  Tromethamine ] Other (See Comments)    Caused hallucination - states not sure / avoids per his MD   Tramadol  Other (See Comments)    Couldn't sleep   "

## 2024-09-29 NOTE — Progress Notes (Signed)
 "  Patient ID: [name] MRN: [id number] DOB/AGE: 1946/09/03 79 y.o.  Primary Care Physician:Hall, Norleen PEDLAR, MD Primary Cardiologist: Branch  CC:  Aortic valvular disease management     FOCUSED PROBLEM LIST:   Aortic stenosis AVA 0.91, MG 41, V-max 4.1, EF 60-65% TTE December 2025 EKG sinus rhythm without bundle-branch blocks Coronary artery calcification Chest CT October 2025 T2DM  Not on insulin   Hypertension  Hyperlipidemia Aortic atherosclerosis Chest CT 2025 CKD stage II Carotid disease status post [name] stenting 2023, left TCAR 2024 Iron  deficiency anemia Requires intermittent iron  infusions BMI 28/BSA 1.97  January 2026:  Patient consents to use of AI scribe. The patient is 79 year old male with the above listed medical problems referred for recommendations regarding his aortic valvular disease.  He was seen recently in Dr. Ranae office.  An echocardiogram demonstrated progression to severe aortic stenosis.  He reported exertional fatigue.  He is here to discuss further.  He has experienced increased fatigue over the past year, with minimal exertion leading to tiredness. Shortness of breath occurs with activities such as showering, necessitating frequent rest during daily tasks like cleaning and cooking. No chest discomfort with exertion, significant leg swelling, or orthopnea. He uses a CPAP machine for a sleep disorder.  Occasional dizziness, particularly when standing up, occurs about once a week.  He has a history of gastrointestinal bleeding, having undergone multiple iron  infusions and a recent endoscopy that did not reveal any major issues. He continues to experience some blood loss.  He has a history of tremors, which he was told are genetic, and is currently on metoprolol . He also has a history of cerebrospinal fluid leaks, which required surgical intervention twice.  He lives in a large house with family members, residing in the basement while his son  lives upstairs. He does not drive at night.  He has upper dentures that he does not use.  He has a few remaining lower teeth.  He denies any dental issues.  Pre Surgical Assessment: 5 M Walk Test  73M=16.94ft  5 Meter Walk Test- trial 1: 7.56 seconds 5 Meter Walk Test- trial 2: 6.60 seconds 5 Meter Walk Test- trial 3: 8.46 seconds 5 Meter Walk Test Average: 7.54 seconds   Past Medical History:  Diagnosis Date   Anemia    Iron  Deficiency   Bilateral renal cysts    Bladder cancer (HCC) UROLOGIST-  DR [facility]   RECURRENT 11/ 2017 --  hx turbt in 2004 and 08-19-2013   BPH without obstruction/lower urinary tract symptoms    Cervical fusion syndrome    LEFT ARM NUMBNESS--  CONTROLLED W/ GABAPENTIN    Complication of anesthesia    Coronary artery disease    CARDIOLOGIST-- DR ALVAN ESPIRIDION JACOBS)   DDD (degenerative disc disease), cervical    DDD (degenerative disc disease), lumbar    Diverticulosis of sigmoid colon    Heart murmur    slight murmur per patient   History of acute gouty arthritis    History of adenomatous polyp of colon    2001;  2012- non-malignant leiomyoma;  04-07-2014  tubualr adenoma and hyperplastic polyp's   History of prostate cancer UROLOGIST-  DR [facility]   S/P  RADIOACTIVE SEED IMPLANTS 2004   HOH (hard of hearing)    no hearing aids   Hyperlipidemia    Hypertension    OA (osteoarthritis)    Occlusion and stenosis of carotid artery without mention of cerebral infarction CARDIOLOGIST  -- DR  DORN ALVAN  LAST DUPLEX  11-23-2015  RICA  (DUPLEX DOPPLER 01-20-2016 RICA 60-79% PROXIMAL) /   LICA <50% (HAD CONSULT W/ DR LAURENCE , VASCULAR SURGEON 01-20-2016)   OSA on CPAP    BiPAP   Peripheral vascular disease    Carotid Artery Stenosis   PONV (postoperative nausea and vomiting)    Prostate cancer (HCC)    per patient years ago- had radiation implant   Spondylosis, cervical    Type 2 diabetes mellitus (HCC)    Wears glasses     Past Surgical  History:  Procedure Laterality Date   ANTERIOR REMOVAL CAGE AND PLATE R6-R2/ CORPECTOMY C7 (FX)/  ALLOGRAFT AND FUSION C3 -- T1/  POSTERIOR DECOMPRESSION BILATERAL LAMINECTOMY C4 -- 6 & PARTIAL C3/  POSTEROLATERAL ARTHRODESIS C3 - T1  06/05/2005   APPENDECTOMY  1990'S   BIOPSY  09/21/2022   Procedure: BIOPSY;  Surgeon: Shaaron Lamar HERO, MD;  [facility];  Service: Endoscopy;;   CATARACT EXTRACTION W/ INTRAOCULAR LENS  IMPLANT, BILATERAL  2013   CERVICAL FUSION  05/11/2005   C3  --  C7/  due to post op quadriparesis same day s/p  anterior C 4,5,6, colpectomy, decompression, removal epidural hematoma, foraminnotomy, cage and plate   COLONOSCOPY  last one 04-07-2014   COLONOSCOPY WITH PROPOFOL  N/A 09/21/2022   Procedure: COLONOSCOPY WITH PROPOFOL ;  Surgeon: Shaaron Lamar HERO, MD;  [facility];  Service: Endoscopy;  Laterality: N/A;   CYSTOSCOPY W/ RETROGRADES Bilateral 08/19/2013   Procedure: CYSTOSCOPY WITH RETROGRADE PYELOGRAM;  Surgeon: Toribio Neysa Repine, MD;  Location: [facility];  Service: Urology;  Laterality: Bilateral;   CYSTOSCOPY W/ RETROGRADES Bilateral 07/18/2016   Procedure: CYSTOSCOPY WITH RETROGRADE PYELOGRAM;  Surgeon: Ricardo Likens, MD;  Location: [facility];  Service: Urology;  Laterality: Bilateral;   ENDOSCOPIC REPAIR CSF LEAK VIA NASAL PASSAGE  2011   ESOPHAGOGASTRODUODENOSCOPY (EGD) WITH PROPOFOL  N/A 09/21/2022   Procedure: ESOPHAGOGASTRODUODENOSCOPY (EGD) WITH PROPOFOL ;  Surgeon: Shaaron Lamar HERO, MD;  [facility];  Service: Endoscopy;  Laterality: N/A;   ESOPHAGOGASTRODUODENOSCOPY (EGD) WITH PROPOFOL  N/A 08/09/2023   Procedure: ESOPHAGOGASTRODUODENOSCOPY (EGD) WITH PROPOFOL ;  Surgeon: Shaaron Lamar HERO, MD;  [facility];  Service: Endoscopy;  Laterality: N/A;   GIVENS CAPSULE STUDY N/A 09/02/2024   Procedure: IMAGING PROCEDURE, GI TRACT, INTRALUMINAL, VIA CAPSULE;  Surgeon: Shaaron Lamar HERO, MD;   [facility];  Service: Endoscopy;  Laterality: N/A;  7:30 am   HEMOSTASIS CLIP PLACEMENT  09/21/2022   Procedure: HEMOSTASIS CLIP PLACEMENT;  Surgeon: Shaaron Lamar HERO, MD;  [facility];  Service: Endoscopy;;   HOT HEMOSTASIS  08/09/2023   Procedure: HOT HEMOSTASIS (ARGON PLASMA COAGULATION/BICAP);  Surgeon: Shaaron Lamar HERO, MD;  [facility];  Service: Endoscopy;;   INTRAOPERATIVE ARTERIOGRAM  CATH LAB  01-05-2009  DR LADONA   RICA   ACUTE ANGLE 80-85% STENOSIS   KNEE ARTHROSCOPY Left 07/15/2017   Procedure: ARTHROSCOPY LEFT  KNEE AND DEBRIDEMENT, medial meniscal tear and chondromalaita;  Surgeon: Ernie Cough, MD;  Location: [facility];  Service: Orthopedics;  Laterality: Left;  60 MINS   LUMBAR FUSION  03-26-2012   L3 --  L5   POLYPECTOMY  09/21/2022   Procedure: POLYPECTOMY;  Surgeon: Shaaron Lamar HERO, MD;  [facility];  Service: Endoscopy;;   RADIOACTIVE PROSTATE SEED IMPLANTS  08-19-2003   SEPTOPLASTY  1998   TOTAL KNEE ARTHROPLASTY Left 02/25/2018   Procedure: LEFT TOTAL KNEE ARTHROPLASTY;  Surgeon: Ernie Cough, MD;  Location: [facility];  Service: Orthopedics;  Laterality: Left;  70 mins   TRANSCAROTID ARTERY REVASCULARIZATION  Right 05/11/2022   Procedure: Right Transcarotid Artery Revascularization;  Surgeon: Sheree Penne Bruckner, MD;  Location: [facility];  Service: Vascular;  Laterality: Right;   TRANSCAROTID ARTERY REVASCULARIZATION  Left 07/19/2023   Procedure: LEFT TRANSCAROTID ARTERY REVASCULARIZATION;  Surgeon: Sheree Penne Bruckner, MD;  Location: [facility] OR;  Service: Vascular;  Laterality: Left;   TRANSURETHRAL RESECTION OF BLADDER TUMOR N/A 08/19/2013   Procedure: TRANSURETHRAL RESECTION OF BLADDER TUMOR (TURBT);  Surgeon: Toribio Neysa Repine, MD;  Location: [facility];  Service: Urology;  Laterality: N/A;   TRANSURETHRAL RESECTION OF BLADDER TUMOR N/A 07/18/2016   Procedure: TRANSURETHRAL RESECTION OF BLADDER TUMOR  (TURBT);  Surgeon: Ricardo Likens, MD;  Location: [facility];  Service: Urology;  Laterality: N/A;   ULTRASOUND GUIDANCE FOR VASCULAR ACCESS Right 07/19/2023   Procedure: ULTRASOUND GUIDANCE FOR VASCULAR ACCESS, RIGHT FEMORAL VEIN;  Surgeon: Sheree Penne Bruckner, MD;  Location[facility];  Service: Vascular;  Laterality: Right;   YAG LASER APPLICATION  07/29/2012   Procedure: YAG LASER APPLICATION;  Surgeon: Oneil T. Roz, MD;  Location: [facility];  Service: Ophthalmology;  Laterality: Right;    Family History  Problem Relation Age of Onset   Breast cancer Mother        mets   Diabetes Mother    Heart disease Father        MI   Colon cancer Brother    Cancer Sister        male organs   Diabetes Sister        family hx   Arthritis Other        entire family   Diabetes Brother        x 2    Social History   Socioeconomic History   Marital status: Widowed    Spouse name: Not on file   Number of children: 2   Years of education: Not on file   Highest education level: Not on file  Occupational History   Occupation: retired//disabled  Tobacco Use   Smoking status: Every Day    Current packs/day: 1.00    Average packs/day: 1 pack/day for 47.0 years (47.0 ttl pk-yrs)    Types: Cigarettes   Smokeless tobacco: Never   Tobacco comments:    pt quits smoking periodically :: started smoking again Jan 2018 1ppd  Vaping Use   Vaping status: Never Used  Substance and Sexual Activity   Alcohol use: No    Alcohol/week: 0.0 standard drinks of alcohol   Drug use: No   Sexual activity: Not Currently  Other Topics Concern   Not on file  Social History Narrative   Not on file   Social Drivers of Health   Tobacco Use: High Risk (10/02/2024)   Patient History    Smoking Tobacco Use: Every Day    Smokeless Tobacco Use: Never    Passive Exposure: Not on file  Financial Resource Strain: Not on file  Food Insecurity: No Food Insecurity (08/09/2023)   Hunger Vital Sign     Worried About Running Out of Food in the Last Year: Never true    Ran Out of Food in the Last Year: Never true  Transportation Needs: No Transportation Needs (08/09/2023)   PRAPARE - Administrator, Civil Service (Medical): No    Lack of Transportation (Non-Medical): No  Physical Activity: Not  on file  Stress: Not on file  Social Connections: Not on file  Intimate Partner Violence: Not At Risk (08/09/2023)   Humiliation, Afraid, Rape, and Kick questionnaire    Fear of Current or Ex-Partner: No    Emotionally Abused: No    Physically Abused: No    Sexually Abused: No  Depression (PHQ2-9): Low Risk (08/18/2024)   Depression (PHQ2-9)    PHQ-2 Score: 0  Alcohol Screen: Not on file  Housing: Patient Declined (08/09/2023)   Housing    Last Housing Risk Score: 0  Utilities: Not At Risk (08/09/2023)   AHC Utilities    Threatened with loss of utilities: No  Health Literacy: Not on file     Prior to Admission medications  Medication Sig Start Date End Date Taking? Authorizing Provider  ACCRUFER 30 MG CAPS Take 1 capsule by mouth 2 (two) times daily.    [provider]  acetaminophen  (TYLENOL ) 500 MG tablet Take 1,000 mg by mouth every 6 (six) hours as needed for moderate pain.    [provider]  albuterol  (VENTOLIN  HFA) 108 (90 Base) MCG/ACT inhaler Inhale 2 puffs into the lungs every 4 (four) hours as needed for shortness of breath or wheezing. 11/22/22   [provider]  allopurinol  (ZYLOPRIM ) 300 MG tablet Take 300 mg by mouth at bedtime.    [provider]  amLODipine  (NORVASC ) 10 MG tablet TAKE 1 TABLET BY MOUTH EVERY DAY 07/24/18   Alvan Dorn FALCON, MD  aspirin  EC 81 MG tablet Take 1 tablet (81 mg total) by mouth daily with breakfast. 08/12/23   Emokpae, Courage, MD  Budeson-Glycopyrrol-Formoterol (BREZTRI AEROSPHERE) 160-9-4.8 MCG/ACT AERO Inhale 1 puff into the lungs daily.    [provider]  Cholecalciferol  (VITAMIN D3)  1.25 MG (50000 UT) CAPS Take 1 capsule by mouth once a week. 02/24/24   [provider]  colchicine  0.6 MG tablet Take 0.6 mg by mouth 2 (two) times daily as needed (gout).     [provider]  cyanocobalamin  (VITAMIN B12) 1000 MCG tablet Take 1 tablet (1,000 mcg total) by mouth daily. 09/21/22   Leotis Bogus, MD  docusate sodium  (COLACE) 100 MG capsule Take 100 mg by mouth at bedtime.    [provider]  ferrous sulfate  325 (65 FE) MG EC tablet Take 325 mg by mouth daily.    [provider]  finasteride  (PROSCAR ) 5 MG tablet Take 5 mg by mouth at bedtime.    [provider]  furosemide  (LASIX ) 40 MG tablet Take 40 mg by mouth every morning.    [provider]  gabapentin  (NEURONTIN ) 300 MG capsule Take 300 mg by mouth 2 (two) times daily.    [provider]  Iron -FA-B Cmp-C-Biot-Probiotic (FUSION PLUS) CAPS Take 1 capsule by mouth daily. 12/19/22   [provider]  labetalol  (NORMODYNE ) 100 MG tablet Take 100 mg by mouth at bedtime.    [provider]  losartan  (COZAAR ) 100 MG tablet Take 100 mg by mouth daily. 07/22/24   [provider]  metFORMIN  (GLUCOPHAGE -XR) 500 MG 24 hr tablet TAKE ONE (1) TABLET BY MOUTH TWICE DAILY    [provider]  NON FORMULARY Pt uses cpap    [provider]  Omega-3-6-9 CAPS Take 1 capsule by mouth 2 (two) times daily.    [provider]  pantoprazole  (PROTONIX ) 40 MG tablet TAKE 1 TABLET BY MOUTH DAILY BEFORE BREAKFAST 07/02/24   Ezzard Sonny RAMAN, PA-C  polyethylene glycol powder (  GLYCOLAX /MIRALAX ) powder TAKE 17MG (1CAPFUL) BY MOUTH(MIXED WITH WATER HILLIARD) DAILY 05/02/15   Obie Princella HERO, MD  rosuvastatin  (CRESTOR ) 10 MG tablet Take 10 mg by mouth every morning.     [provider]  tamsulosin  (FLOMAX ) 0.4 MG CAPS capsule Take 0.4 mg by mouth daily. 09/05/20   [provider]    Allergies[1]  REVIEW OF SYSTEMS:  General: no  fevers/chills/night sweats Eyes: no blurry vision, diplopia, or amaurosis ENT: no sore throat or hearing loss Resp: no cough, wheezing, or hemoptysis CV: no edema or palpitations GI: no abdominal pain, nausea, vomiting, diarrhea, or constipation GU: no dysuria, frequency, or hematuria Skin: no rash Neuro: no headache, numbness, tingling, or weakness of extremities Musculoskeletal: no joint pain or swelling Heme: no bleeding, DVT, or easy bruising Endo: no polydipsia or polyuria  BP (!) 118/56   Pulse 77   Resp 16   Ht 5' 7 (1.702 m)   Wt 184 lb 3.2 oz (83.6 kg)   SpO2 97%   BMI 28.85 kg/m   PHYSICAL EXAM: GEN:  AO x 3 in no acute distress HEENT: Normal Dentition: Poor Neck: JVP normal. +2 carotid upstrokes without bruits. No thyromegaly. Lungs: equal expansion, clear bilaterally CV: Apex is discrete and nondisplaced, RRR with 3/6 SEM Abd: soft, non-tender, non-distended; no bruit; positive bowel sounds Ext: no edema, ecchymoses, or cyanosis Vascular: 2+ femoral pulses, 2+ radial pulses       Skin: warm and dry without rash Neuro: CN II-XII grossly intact; motor and sensory grossly intact    DATA AND STUDIES:  EKG: January 2026 sinus rhythm with nonspecific ST and T wave changes no bundle-branch blocks  EKG Interpretation Date/Time:    Ventricular Rate:    PR Interval:    QRS Duration:    QT Interval:    QTC Calculation:   R Axis:      Text Interpretation:          CARDIAC STUDIES: Refer to CV Procedures and Imaging Tabs  08/03/2024: Hemoglobin 10.8; Platelets 205   STS RISK CALCULATOR: Pending  NYHA CLASS: 2    ASSESSMENT AND PLAN:   1. Nonrheumatic aortic valve stenosis   2. Coronary artery calcification seen on CAT scan   3. Type 2 diabetes mellitus without complication, without long-term current use of insulin  (HCC)   4. Hypertension associated with diabetes (HCC)   5. Hyperlipidemia associated with type 2 diabetes mellitus (HCC)   6.  Aortic atherosclerosis   7. CKD stage 2 due to type 2 diabetes mellitus (HCC)   8. H/O transcarotid artery revascularization (TCAR)   9. Presence of internal carotid stent   10. Iron  deficiency anemia, unspecified iron  deficiency anemia type     Aortic stenosis: I reviewed the patient's echocardiogram which demonstrates severe aortic stenosis heavily calcified valve and a mean gradient above 40 mmHg.  He has developed NYHA class II symptoms of dyspnea.  His last blood counts seem to be stable so I do not think his anemia can explain his symptoms.  I believe it is likely due to his aortic valvular disease.  We have decided to pursue an evaluation for potential aortic valve intervention.  Will refer for coronary angiography, right heart catheterization, CTA, and cardiothoracic surgical appointment. Coronary artery calcification: Continue aspirin  81 mg, rosuvastatin  10 mg. T2DM: Continue aspirin  81 mg, rosuvastatin  10 mg, consider ARB and SGLT2 inhibitor. Hypertension: Continue amlodipine  10 mg, losartan  100 mg.  BP is well-controlled today. Hyperlipidemia: Continue rosuvastatin  10mg . Aortic atherosclerosis: Continue  aspirin  81 mg, rosuvastatin  10 mg. CKD stage II: Continue losartan  100 mg; consider SGLT2 inhibitor in the future. History of left TCAR: Continue aspirin  81 mg, rosuvastatin  10 mg. History of right carotid stenting: See #7 above. Iron  deficiency anemia: Requires intermittent iron  infusions.  Last hemoglobin in November was above 10.  Likely not the major driver of his symptoms currently.   I have personally reviewed the patients imaging data as summarized above.  I have reviewed the natural history of aortic stenosis with the patient and family members who are present today. We have discussed the limitations of medical therapy and the poor prognosis associated with symptomatic aortic stenosis. We have also reviewed potential treatment options, including palliative medical therapy,  conventional surgical aortic valve replacement, and transcatheter aortic valve replacement. We discussed treatment options in the context of this patient's specific comorbid medical conditions.   All of the patient's questions were answered today. Will make further recommendations based on the results of studies outlined above.   I spent 41 minutes reviewing all clinical data during and prior to this visit including all relevant imaging studies, laboratories, clinical information from other health systems and prior notes from both Cardiology and other specialties, interviewing the patient, conducting a complete physical examination, and coordinating care in order to formulate a comprehensive and personalized evaluation and treatment plan.   Marytza Grandpre K Aslee Such, MD  10/02/2024 9:00 AM    [facility] [address] [place], [place], [place]  [address] Phone: [phone]; Fax: [phone]        [1]  Allergies Allergen Reactions   Dilaudid  [Hydromorphone  Hcl] Other (See Comments)    acts a little off   Lyrica [Pregabalin] Nausea Only and Other (See Comments)    Make me feel bad   Toradol  [Ketorolac  Tromethamine ] Other (See Comments)    Caused hallucination - states not sure / avoids per his MD   Tramadol  Other (See Comments)    Couldn't sleep   "

## 2024-09-29 NOTE — Telephone Encounter (Signed)
 Per Almarie: No problem.Thank you for clarifying. Please stop labetalol  and start metoprolol  tartrate 25 mg BID and bring him back in 1 week to repeat an EKG.    Thanks!   Best, Almarie Crate, NP  Patent verbalized understanding

## 2024-10-02 ENCOUNTER — Ambulatory Visit: Attending: Internal Medicine | Admitting: Internal Medicine

## 2024-10-02 ENCOUNTER — Encounter: Payer: Self-pay | Admitting: Internal Medicine

## 2024-10-02 VITALS — BP 118/56 | HR 77 | Resp 16 | Ht 67.0 in | Wt 184.2 lb

## 2024-10-02 DIAGNOSIS — I350 Nonrheumatic aortic (valve) stenosis: Secondary | ICD-10-CM | POA: Diagnosis not present

## 2024-10-02 DIAGNOSIS — I2510 Atherosclerotic heart disease of native coronary artery without angina pectoris: Secondary | ICD-10-CM

## 2024-10-02 DIAGNOSIS — I152 Hypertension secondary to endocrine disorders: Secondary | ICD-10-CM | POA: Diagnosis not present

## 2024-10-02 DIAGNOSIS — Z95828 Presence of other vascular implants and grafts: Secondary | ICD-10-CM

## 2024-10-02 DIAGNOSIS — E1159 Type 2 diabetes mellitus with other circulatory complications: Secondary | ICD-10-CM | POA: Diagnosis not present

## 2024-10-02 DIAGNOSIS — E1169 Type 2 diabetes mellitus with other specified complication: Secondary | ICD-10-CM

## 2024-10-02 DIAGNOSIS — E785 Hyperlipidemia, unspecified: Secondary | ICD-10-CM

## 2024-10-02 DIAGNOSIS — N182 Chronic kidney disease, stage 2 (mild): Secondary | ICD-10-CM

## 2024-10-02 DIAGNOSIS — D509 Iron deficiency anemia, unspecified: Secondary | ICD-10-CM

## 2024-10-02 DIAGNOSIS — Z9862 Peripheral vascular angioplasty status: Secondary | ICD-10-CM

## 2024-10-02 DIAGNOSIS — E119 Type 2 diabetes mellitus without complications: Secondary | ICD-10-CM

## 2024-10-02 DIAGNOSIS — E1122 Type 2 diabetes mellitus with diabetic chronic kidney disease: Secondary | ICD-10-CM

## 2024-10-02 DIAGNOSIS — I700 Atherosclerosis of aorta: Secondary | ICD-10-CM

## 2024-10-02 DIAGNOSIS — I10 Essential (primary) hypertension: Secondary | ICD-10-CM

## 2024-10-02 NOTE — Patient Instructions (Signed)
 Lab Work: CBC AND BMP   If you have labs (blood work) drawn today and your tests are completely normal, you will receive your results only by: MyChart Message (if you have MyChart) OR A paper copy in the mail If you have any lab test that is abnormal or we need to change your treatment, we will call you to review the results.  Testing/Procedures:  [facility] A DEPT OF [place] [address] A DEPT OF THE [place] [address] [place] [address] Dept: [phone] Loc: [phone]  BENJERMAN MOLINELLI  10/02/2024  You are scheduled for a Cardiac Catheterization on Friday, January 30 with Dr. Lurena Red.  1. Please arrive at the [facility] (Main Entrance A) at [facility]: [address] [place], [place] [address] at 11:00 AM (This time is 2 hour(s) before your procedure to ensure your preparation).   Free valet parking service is available. You will check in at ADMITTING. The support person will be asked to wait in the waiting room.  It is OK to have someone drop you off and come back when you are ready to be discharged.    Special note: Every effort is made to have your procedure done on time. Please understand that emergencies sometimes delay scheduled procedures.  2. Diet: Light meals may be eaten up to 6 hours before scheduled procedures from 12N and after; please stop eating at 7:00 AM   Light meal consist of plain toast, fruit, light soups, crackers.   3. Hydration: Nothing to eat and drink after midnight. (For TEE and Cath the same day)  4. Labs: You will need to have blood drawn on  January 16 at [facility] (1st Floor), [address] [place], [place] [address]. You do not need to be fasting.  5. Medication instructions in preparation for your procedure:   Contrast Allergy: No   Current Outpatient Medications (Endocrine & Metabolic):    metFORMIN   (GLUCOPHAGE -XR) 500 MG 24 hr tablet, TAKE ONE (1) TABLET BY MOUTH TWICE DAILY  Current Outpatient Medications (Cardiovascular):    amLODipine  (NORVASC ) 10 MG tablet, TAKE 1 TABLET BY MOUTH EVERY DAY   furosemide  (LASIX ) 40 MG tablet, Take 40 mg by mouth every morning.   metoprolol  tartrate (LOPRESSOR ) 25 MG tablet, Take 1 tablet (25 mg total) by mouth 2 (two) times daily.   rosuvastatin  (CRESTOR ) 10 MG tablet, Take 10 mg by mouth every morning.    labetalol  (NORMODYNE ) 100 MG tablet, Take 100 mg by mouth at bedtime. (Patient not taking: Reported on 10/02/2024)  Current Outpatient Medications (Respiratory):    albuterol  (VENTOLIN  HFA) 108 (90 Base) MCG/ACT inhaler, Inhale 2 puffs into the lungs every 4 (four) hours as needed for shortness of breath or wheezing.   Budeson-Glycopyrrol-Formoterol (BREZTRI AEROSPHERE) 160-9-4.8 MCG/ACT AERO, Inhale 1 puff into the lungs daily.  Current Outpatient Medications (Analgesics):    acetaminophen  (TYLENOL ) 500 MG tablet, Take 1,000 mg by mouth every 6 (six) hours as needed for moderate pain.   allopurinol  (ZYLOPRIM ) 300 MG tablet, Take 300 mg by mouth at bedtime.   aspirin  EC 81 MG tablet, Take 1 tablet (81 mg total) by mouth daily with breakfast.   colchicine  0.6 MG tablet, Take 0.6 mg by mouth 2 (two) times daily as needed (gout).   Current Outpatient Medications (Hematological):    ACCRUFER 30 MG CAPS, Take 1 capsule  by mouth 2 (two) times daily.   cyanocobalamin  (VITAMIN B12) 1000 MCG tablet, Take 1 tablet (1,000 mcg total) by mouth daily.   Iron -FA-B Cmp-C-Biot-Probiotic (FUSION PLUS) CAPS, Take 1 capsule by mouth daily.   ferrous sulfate  325 (65 FE) MG EC tablet, Take 325 mg by mouth daily. (Patient not taking: Reported on 10/02/2024)  Current Outpatient Medications (Other):    Cholecalciferol  (VITAMIN D3) 1.25 MG (50000 UT) CAPS, Take 1 capsule by mouth once a week.   docusate sodium  (COLACE) 100 MG capsule, Take 100 mg by mouth at bedtime.    finasteride  (PROSCAR ) 5 MG tablet, Take 5 mg by mouth at bedtime.   gabapentin  (NEURONTIN ) 300 MG capsule, Take 300 mg by mouth 2 (two) times daily.   NON FORMULARY, Pt uses cpap   Omega-3-6-9 CAPS, Take 1 capsule by mouth 2 (two) times daily.   pantoprazole  (PROTONIX ) 40 MG tablet, TAKE 1 TABLET BY MOUTH DAILY BEFORE BREAKFAST   polyethylene glycol powder (GLYCOLAX /MIRALAX ) powder, TAKE 17MG (1CAPFUL) BY MOUTH(MIXED WITH WATER HILLIARD) DAILY   tamsulosin  (FLOMAX ) 0.4 MG CAPS capsule, Take 0.4 mg by mouth daily. *For reference purposes while preparing patient instructions.   Delete this med list prior to printing instructions for patient.*        Do not take Diabetes Med Glucophage  (Metformin ) on the day of the procedure and HOLD 48 HOURS AFTER THE PROCEDURE.,  Friday, January 30,  On the morning of your procedure, take your Aspirin  81 mg and any morning medicines NOT listed above.  You may use sips of water .  6. Plan to go home the same day, you will only stay overnight if medically necessary. 7. Bring a current list of your medications and current insurance cards. 8. You MUST have a responsible person to drive you home. 9. Someone MUST be with you the first 24 hours after you arrive home or your discharge will be delayed. 10. Please wear clothes that are easy to get on and off and wear slip-on shoes.  Thank you for allowing us  to care for you!   -- [place]   Follow-Up: At [facility], you and your health needs are our priority.  As part of our continuing mission to provide you with exceptional heart care, our providers are all part of one team.  This team includes your primary Cardiologist (physician) and Advanced Practice Providers or APPs (Physician Assistants and Nurse Practitioners) who all work together to provide you with the care you need, when you need it.   We recommend signing up for the patient portal called MyChart.  Sign up  information is provided on this After Visit Summary.  MyChart is used to connect with patients for Virtual Visits (Telemedicine).  Patients are able to view lab/test results, encounter notes, upcoming appointments, etc.  Non-urgent messages can be sent to your provider as well.   To learn more about what you can do with MyChart, go to [email or web address].   Other Instructions

## 2024-10-03 ENCOUNTER — Ambulatory Visit: Payer: Self-pay | Admitting: Internal Medicine

## 2024-10-03 LAB — CBC
Hematocrit: 35 % — ABNORMAL LOW (ref 37.5–51.0)
Hemoglobin: 11.4 g/dL — ABNORMAL LOW (ref 13.0–17.7)
MCH: 31.6 pg (ref 26.6–33.0)
MCHC: 32.6 g/dL (ref 31.5–35.7)
MCV: 97 fL (ref 79–97)
Platelets: 187 x10E3/uL (ref 150–450)
RBC: 3.61 x10E6/uL — ABNORMAL LOW (ref 4.14–5.80)
RDW: 13.6 % (ref 11.6–15.4)
WBC: 9.2 x10E3/uL (ref 3.4–10.8)

## 2024-10-03 LAB — BASIC METABOLIC PANEL WITH GFR
BUN/Creatinine Ratio: 18 (ref 10–24)
BUN: 25 mg/dL (ref 8–27)
CO2: 21 mmol/L (ref 20–29)
Calcium: 9.9 mg/dL (ref 8.6–10.2)
Chloride: 104 mmol/L (ref 96–106)
Creatinine, Ser: 1.36 mg/dL — ABNORMAL HIGH (ref 0.76–1.27)
Glucose: 125 mg/dL — ABNORMAL HIGH (ref 70–99)
Potassium: 4.7 mmol/L (ref 3.5–5.2)
Sodium: 145 mmol/L — ABNORMAL HIGH (ref 134–144)
eGFR: 53 mL/min/1.73 — ABNORMAL LOW

## 2024-10-07 ENCOUNTER — Ambulatory Visit: Attending: Cardiology | Admitting: *Deleted

## 2024-10-07 DIAGNOSIS — I2510 Atherosclerotic heart disease of native coronary artery without angina pectoris: Secondary | ICD-10-CM

## 2024-10-07 NOTE — Progress Notes (Signed)
 Presents for nurse visit to repeat EKG per recent medication change by Almarie Crate on 09/29/2024. Please stop labetalol  and start metoprolol  tartrate 25 mg BID and bring him back in 1 week to repeat an EKG. Medications reviewed Reports taking all medications as prescribed without missing doses Denies dizziness, chest pain. Reports SOB that he's had for a while and is unchanged. EKG done and sent to provider for review.

## 2024-10-07 NOTE — Patient Instructions (Signed)
Your physician recommends that you continue on your current medications as directed. Please refer to the Current Medication list given to you today. Your physician recommends that you schedule a follow-up appointment in: as planned 

## 2024-10-15 ENCOUNTER — Telehealth: Payer: Self-pay | Admitting: *Deleted

## 2024-10-15 NOTE — Telephone Encounter (Addendum)
 Cardiac Catheterization scheduled at [facility] for: Friday October 16, 2024 1 PM Arrival time [facility] Main Entrance A at: 11 AM  Diet:  -May have light meal until 7 AM. (6 hours before procedure time) Approved light meal consists of plain toast, fruit, light soups, crackers.  Hydration: -May drink clear liquids until 2 hours before the procedure.  Approved liquids: Water , clear tea, black coffee, fruit juices-non-citric and without pulp,Gatorade, plain Jello/popsicles.   -Please drink 16 oz of water  2 hours before procedure.  Medication instructions: -Hold:  Metformin -day of procedure and 48 hours after  Lasix -AM of procedure -Other usual morning medications can be taken including aspirin  81 mg.  Plan to go home the same day, you will only stay overnight if medically necessary.  You must have responsible adult to drive you home.  Someone must be with you the first 24 hours after you arrive home.  Reviewed procedure instructions with patient.

## 2024-10-16 ENCOUNTER — Encounter (HOSPITAL_COMMUNITY)
Admission: RE | Disposition: A | Discharge status: Home / Self Care | Payer: Self-pay | Source: Home / Self Care | Attending: Internal Medicine

## 2024-10-16 ENCOUNTER — Other Ambulatory Visit: Payer: Self-pay

## 2024-10-16 ENCOUNTER — Ambulatory Visit (HOSPITAL_COMMUNITY)
Admission: RE | Admit: 2024-10-16 | Discharge: 2024-10-16 | Disposition: A | Discharge status: Home / Self Care | Attending: Internal Medicine | Admitting: Internal Medicine

## 2024-10-16 DIAGNOSIS — I350 Nonrheumatic aortic (valve) stenosis: Secondary | ICD-10-CM | POA: Insufficient documentation

## 2024-10-16 DIAGNOSIS — I152 Hypertension secondary to endocrine disorders: Secondary | ICD-10-CM | POA: Diagnosis not present

## 2024-10-16 DIAGNOSIS — E1122 Type 2 diabetes mellitus with diabetic chronic kidney disease: Secondary | ICD-10-CM | POA: Insufficient documentation

## 2024-10-16 DIAGNOSIS — Z7984 Long term (current) use of oral hypoglycemic drugs: Secondary | ICD-10-CM | POA: Diagnosis not present

## 2024-10-16 DIAGNOSIS — N182 Chronic kidney disease, stage 2 (mild): Secondary | ICD-10-CM | POA: Diagnosis not present

## 2024-10-16 DIAGNOSIS — I700 Atherosclerosis of aorta: Secondary | ICD-10-CM | POA: Diagnosis not present

## 2024-10-16 DIAGNOSIS — I2510 Atherosclerotic heart disease of native coronary artery without angina pectoris: Secondary | ICD-10-CM | POA: Insufficient documentation

## 2024-10-16 DIAGNOSIS — Z7982 Long term (current) use of aspirin: Secondary | ICD-10-CM | POA: Diagnosis not present

## 2024-10-16 DIAGNOSIS — D509 Iron deficiency anemia, unspecified: Secondary | ICD-10-CM | POA: Diagnosis not present

## 2024-10-16 DIAGNOSIS — F17210 Nicotine dependence, cigarettes, uncomplicated: Secondary | ICD-10-CM | POA: Diagnosis not present

## 2024-10-16 DIAGNOSIS — I129 Hypertensive chronic kidney disease with stage 1 through stage 4 chronic kidney disease, or unspecified chronic kidney disease: Secondary | ICD-10-CM | POA: Insufficient documentation

## 2024-10-16 DIAGNOSIS — Z79899 Other long term (current) drug therapy: Secondary | ICD-10-CM | POA: Diagnosis not present

## 2024-10-16 DIAGNOSIS — E7849 Other hyperlipidemia: Secondary | ICD-10-CM | POA: Diagnosis not present

## 2024-10-16 DIAGNOSIS — E1151 Type 2 diabetes mellitus with diabetic peripheral angiopathy without gangrene: Secondary | ICD-10-CM | POA: Diagnosis not present

## 2024-10-16 DIAGNOSIS — E1169 Type 2 diabetes mellitus with other specified complication: Secondary | ICD-10-CM | POA: Diagnosis not present

## 2024-10-16 LAB — POCT I-STAT EG7
Acid-Base Excess: 1 mmol/L (ref 0.0–2.0)
Acid-Base Excess: 2 mmol/L (ref 0.0–2.0)
Bicarbonate: 26.9 mmol/L (ref 20.0–28.0)
Bicarbonate: 27.9 mmol/L (ref 20.0–28.0)
Calcium, Ion: 1.18 mmol/L (ref 1.15–1.40)
Calcium, Ion: 1.26 mmol/L (ref 1.15–1.40)
HCT: 33 % — ABNORMAL LOW (ref 39.0–52.0)
HCT: 33 % — ABNORMAL LOW (ref 39.0–52.0)
Hemoglobin: 11.2 g/dL — ABNORMAL LOW (ref 13.0–17.0)
Hemoglobin: 11.2 g/dL — ABNORMAL LOW (ref 13.0–17.0)
O2 Saturation: 71 %
O2 Saturation: 69 %
Potassium: 3.8 mmol/L (ref 3.5–5.1)
Potassium: 3.9 mmol/L (ref 3.5–5.1)
Sodium: 146 mmol/L — ABNORMAL HIGH (ref 135–145)
Sodium: 143 mmol/L (ref 135–145)
TCO2: 28 mmol/L (ref 22–32)
TCO2: 29 mmol/L (ref 22–32)
pCO2, Ven: 48.6 mmHg (ref 44–60)
pCO2, Ven: 50.7 mmHg (ref 44–60)
pH, Ven: 7.351 (ref 7.25–7.43)
pH, Ven: 7.349 (ref 7.25–7.43)
pO2, Ven: 40 mmHg (ref 32–45)
pO2, Ven: 38 mmHg (ref 32–45)

## 2024-10-16 LAB — POCT I-STAT 7, (LYTES, BLD GAS, ICA,H+H)
Acid-Base Excess: 1 mmol/L (ref 0.0–2.0)
Bicarbonate: 26.6 mmol/L (ref 20.0–28.0)
Calcium, Ion: 1.25 mmol/L (ref 1.15–1.40)
HCT: 34 % — ABNORMAL LOW (ref 39.0–52.0)
Hemoglobin: 11.6 g/dL — ABNORMAL LOW (ref 13.0–17.0)
O2 Saturation: 89 %
Potassium: 3.9 mmol/L (ref 3.5–5.1)
Sodium: 144 mmol/L (ref 135–145)
TCO2: 28 mmol/L (ref 22–32)
pCO2 arterial: 43.8 mmHg (ref 32–48)
pH, Arterial: 7.391 (ref 7.35–7.45)
pO2, Arterial: 57 mmHg — ABNORMAL LOW (ref 83–108)

## 2024-10-16 LAB — GLUCOSE, CAPILLARY: Glucose-Capillary: 131 mg/dL — ABNORMAL HIGH (ref 70–99)

## 2024-10-16 LAB — POCT ACTIVATED CLOTTING TIME: Activated Clotting Time: 214 s

## 2024-10-16 MED ORDER — SODIUM CHLORIDE 0.9 % IV SOLN: 250.0000 mL | INTRAVENOUS | Status: DC | PRN

## 2024-10-16 MED ORDER — MIDAZOLAM HCL (PF) 2 MG/2ML IJ SOLN
INTRAMUSCULAR | Status: DC | PRN
  Administered 2024-10-16: 1 mg via INTRAVENOUS

## 2024-10-16 MED ORDER — ACETAMINOPHEN 325 MG PO TABS: 650.0000 mg | ORAL_TABLET | ORAL | Status: DC | PRN

## 2024-10-16 MED ORDER — HEPARIN SODIUM (PORCINE) 1000 UNIT/ML IJ SOLN
INTRAMUSCULAR | Status: AC
  Filled 2024-10-16: qty 10

## 2024-10-16 MED ORDER — ONDANSETRON HCL 4 MG/2ML IJ SOLN: 4.0000 mg | Freq: Four times a day (QID) | INTRAMUSCULAR | Status: DC | PRN

## 2024-10-16 MED ORDER — ASPIRIN 81 MG PO CHEW: 81.0000 mg | CHEWABLE_TABLET | ORAL | Status: DC

## 2024-10-16 MED ORDER — MIDAZOLAM HCL 2 MG/2ML IJ SOLN
INTRAMUSCULAR | Status: AC
  Filled 2024-10-16: qty 2

## 2024-10-16 MED ORDER — LIDOCAINE-EPINEPHRINE 1 %-1:100000 IJ SOLN
INTRAMUSCULAR | Status: AC
  Filled 2024-10-16: qty 1

## 2024-10-16 MED ORDER — HYDRALAZINE HCL 20 MG/ML IJ SOLN: 10.0000 mg | INTRAMUSCULAR | Status: DC | PRN

## 2024-10-16 MED ORDER — IOHEXOL 350 MG/ML SOLN
INTRAVENOUS | Status: DC | PRN
  Administered 2024-10-16: 55 mL

## 2024-10-16 MED ORDER — LIDOCAINE HCL (PF) 1 % IJ SOLN
INTRAMUSCULAR | Status: AC
  Filled 2024-10-16: qty 30

## 2024-10-16 MED ORDER — FREE WATER: 500.0000 mL | Freq: Once | Status: DC

## 2024-10-16 MED ORDER — HEPARIN (PORCINE) IN NACL 1000-0.9 UT/500ML-% IV SOLN
INTRAVENOUS | Status: DC | PRN
  Administered 2024-10-16: 1000 mL

## 2024-10-16 MED ORDER — LABETALOL HCL 5 MG/ML IV SOLN: 10.0000 mg | INTRAVENOUS | Status: DC | PRN

## 2024-10-16 MED ORDER — LIDOCAINE HCL (PF) 1 % IJ SOLN
INTRAMUSCULAR | Status: DC | PRN
  Administered 2024-10-16 (×2): 2 mL via INTRADERMAL

## 2024-10-16 MED ORDER — FENTANYL CITRATE (PF) 100 MCG/2ML IJ SOLN
INTRAMUSCULAR | Status: DC | PRN
  Administered 2024-10-16: 25 ug via INTRAVENOUS

## 2024-10-16 MED ORDER — SODIUM CHLORIDE 0.9% FLUSH: 3.0000 mL | Freq: Two times a day (BID) | INTRAVENOUS | Status: DC

## 2024-10-16 MED ORDER — HEPARIN SODIUM (PORCINE) 1000 UNIT/ML IJ SOLN
INTRAMUSCULAR | Status: DC | PRN
  Administered 2024-10-16: 5000 [IU] via INTRA_ARTERIAL

## 2024-10-16 MED ORDER — VERAPAMIL HCL 2.5 MG/ML IV SOLN
INTRAVENOUS | Status: AC
  Filled 2024-10-16: qty 2

## 2024-10-16 MED ORDER — SODIUM CHLORIDE 0.9 % IV SOLN
INTRAVENOUS | Status: DC | PRN
  Administered 2024-10-16: 10 mL/h via INTRAVENOUS

## 2024-10-16 MED ORDER — LIDOCAINE-EPINEPHRINE 1 %-1:100000 IJ SOLN
INTRAMUSCULAR | Status: DC | PRN
  Administered 2024-10-16: 2 mL via INTRADERMAL

## 2024-10-16 MED ORDER — FENTANYL CITRATE (PF) 100 MCG/2ML IJ SOLN
INTRAMUSCULAR | Status: AC
  Filled 2024-10-16: qty 2

## 2024-10-16 MED ORDER — VERAPAMIL HCL 2.5 MG/ML IV SOLN
INTRAVENOUS | Status: DC | PRN
  Administered 2024-10-16: 10 mL via INTRA_ARTERIAL

## 2024-10-16 MED ORDER — SODIUM CHLORIDE 0.9% FLUSH: 3.0000 mL | INTRAVENOUS | Status: DC | PRN

## 2024-10-16 NOTE — Progress Notes (Signed)
 Patient was son was given discharge instructions. Both verbalized understanding.

## 2024-10-16 NOTE — Interval H&P Note (Signed)
 History and Physical Interval Note:  10/16/2024 6:33 AM  [name]  has presented today for surgery, with the diagnosis of AS.  The various methods of treatment have been discussed with the patient and family. After consideration of risks, benefits and other options for treatment, the patient has consented to  Procedures: RIGHT/LEFT HEART CATH AND CORONARY ANGIOGRAPHY (N/A) as a surgical intervention.  The patient's history has been reviewed, patient examined, no change in status, stable for surgery.  I have reviewed the patient's chart and labs.  Questions were answered to the patient's satisfaction.     Jasmon Graffam K Ashleigh Arya

## 2024-10-16 NOTE — Discharge Instructions (Addendum)
 Restart metformin  on 10/18/24  Drink plenty of fluid for 48 hours and keep wrist elevated at heart level for 24 hours  Radial Site Care   This sheet gives you information about how to care for yourself after your procedure. Your health care provider may also give you more specific instructions. If you have problems or questions, contact your health care provider. What can I expect after the procedure? After the procedure, it is common to have: Bruising and tenderness at the catheter insertion area. Follow these instructions at home: Medicines Take over-the-counter and prescription medicines only as told by your health care provider. Insertion site care Follow instructions from your health care provider about how to take care of your insertion site. Make sure you: Wash your hands with soap and water  before you change your bandage (dressing). If soap and water  are not available, use hand sanitizer. remove your dressing as told by your health care provider. In 24 hours Check your insertion site every day for signs of infection. Check for: Redness, swelling, or pain. Fluid or blood. Pus or a bad smell. Warmth. Do not take baths, swim, or use a hot tub until your health care provider approves. You may shower 24-48 hours after the procedure, or as directed by your health care provider. Remove the dressing and gently wash the site with plain soap and water . Pat the area dry with a clean towel. Do not rub the site. That could cause bleeding. Do not apply powder or lotion to the site. Activity   For 24 hours after the procedure, or as directed by your health care provider: Do not flex or bend the affected arm. Do not push or pull heavy objects with the affected arm. Do not drive yourself home from the hospital or clinic. You may drive 24 hours after the procedure unless your health care provider tells you not to. Do not operate machinery or power tools. Do not lift anything that is heavier  than 10 lb (4.5 kg), or the limit that you are told, until your health care provider says that it is safe. For 4 days Ask your health care provider when it is okay to: Return to work or school. Resume usual physical activities or sports. Resume sexual activity. General instructions If the catheter site starts to bleed, raise your arm and put firm pressure on the site. If the bleeding does not stop, get help right away. This is a medical emergency. If you went home on the same day as your procedure, a responsible adult should be with you for the first 24 hours after you arrive home. Keep all follow-up visits as told by your health care provider. This is important. Contact a health care provider if: You have a fever. You have redness, swelling, or yellow drainage around your insertion site. Get help right away if: You have unusual pain at the radial site. The catheter insertion area swells very fast. The insertion area is bleeding, and the bleeding does not stop when you hold steady pressure on the area. Your arm or hand becomes pale, cool, tingly, or numb. These symptoms may represent a serious problem that is an emergency. Do not wait to see if the symptoms will go away. Get medical help right away. Call your local emergency services (911 in the [place] Do not drive yourself to the hospital. Summary After the procedure, it is common to have bruising and tenderness at the site. Follow instructions from your health care provider about how to  take care of your radial site wound. Check the wound every day for signs of infection. Do not lift anything that is heavier than 10 lb (4.5 kg), or the limit that you are told, until your health care provider says that it is safe. This information is not intended to replace advice given to you by your health care provider. Make sure you discuss any questions you have with your health care provider. Document Revised: 10/09/2017 Document Reviewed:  10/09/2017 Elsevier Patient Education  2020 [employer].

## 2024-10-17 ENCOUNTER — Encounter (HOSPITAL_COMMUNITY): Payer: Self-pay | Admitting: Internal Medicine

## 2024-10-28 ENCOUNTER — Other Ambulatory Visit (HOSPITAL_COMMUNITY)

## 2024-11-06 ENCOUNTER — Encounter: Admitting: Thoracic Surgery (Cardiothoracic Vascular Surgery)

## 2024-11-17 ENCOUNTER — Inpatient Hospital Stay: Attending: Hematology

## 2024-11-24 ENCOUNTER — Inpatient Hospital Stay: Admitting: Oncology

## 2024-12-25 ENCOUNTER — Ambulatory Visit: Admitting: Nurse Practitioner

## 2024-12-30 ENCOUNTER — Ambulatory Visit

## 2024-12-30 ENCOUNTER — Encounter (HOSPITAL_COMMUNITY)
